# Patient Record
Sex: Female | Born: 1960
Health system: Southern US, Community
[De-identification: ages and names within clinical notes are randomized; demographics above are authoritative.]

## PROBLEM LIST (undated history)

## (undated) DIAGNOSIS — M797 Fibromyalgia: Secondary | ICD-10-CM

## (undated) DIAGNOSIS — D62 Acute posthemorrhagic anemia: Secondary | ICD-10-CM

## (undated) DIAGNOSIS — R112 Nausea with vomiting, unspecified: Secondary | ICD-10-CM

## (undated) DIAGNOSIS — J449 Chronic obstructive pulmonary disease, unspecified: Secondary | ICD-10-CM

## (undated) DIAGNOSIS — Z8619 Personal history of other infectious and parasitic diseases: Secondary | ICD-10-CM

## (undated) DIAGNOSIS — F329 Major depressive disorder, single episode, unspecified: Secondary | ICD-10-CM

## (undated) DIAGNOSIS — Q210 Ventricular septal defect: Secondary | ICD-10-CM

## (undated) DIAGNOSIS — M1712 Unilateral primary osteoarthritis, left knee: Secondary | ICD-10-CM

## (undated) DIAGNOSIS — Q2112 Patent foramen ovale: Secondary | ICD-10-CM

## (undated) DIAGNOSIS — M1711 Unilateral primary osteoarthritis, right knee: Secondary | ICD-10-CM

## (undated) DIAGNOSIS — F419 Anxiety disorder, unspecified: Secondary | ICD-10-CM

## (undated) DIAGNOSIS — R569 Unspecified convulsions: Secondary | ICD-10-CM

## (undated) DIAGNOSIS — G4700 Insomnia, unspecified: Secondary | ICD-10-CM

## (undated) DIAGNOSIS — F32A Depression, unspecified: Secondary | ICD-10-CM

## (undated) DIAGNOSIS — Z9889 Other specified postprocedural states: Secondary | ICD-10-CM

## (undated) DIAGNOSIS — Q283 Other malformations of cerebral vessels: Secondary | ICD-10-CM

## (undated) DIAGNOSIS — K589 Irritable bowel syndrome without diarrhea: Secondary | ICD-10-CM

## (undated) DIAGNOSIS — R0602 Shortness of breath: Secondary | ICD-10-CM

## (undated) DIAGNOSIS — R110 Nausea: Secondary | ICD-10-CM

## (undated) DIAGNOSIS — L4050 Arthropathic psoriasis, unspecified: Secondary | ICD-10-CM

## (undated) DIAGNOSIS — E7431 Sucrase-isomaltase deficiency: Secondary | ICD-10-CM

## (undated) DIAGNOSIS — Q211 Atrial septal defect: Secondary | ICD-10-CM

## (undated) DIAGNOSIS — M069 Rheumatoid arthritis, unspecified: Secondary | ICD-10-CM

## (undated) DIAGNOSIS — Z8709 Personal history of other diseases of the respiratory system: Secondary | ICD-10-CM

## (undated) DIAGNOSIS — L400 Psoriasis vulgaris: Secondary | ICD-10-CM

## (undated) DIAGNOSIS — H9190 Unspecified hearing loss, unspecified ear: Secondary | ICD-10-CM

## (undated) DIAGNOSIS — M2550 Pain in unspecified joint: Secondary | ICD-10-CM

## (undated) DIAGNOSIS — M1990 Unspecified osteoarthritis, unspecified site: Secondary | ICD-10-CM

## (undated) DIAGNOSIS — G4730 Sleep apnea, unspecified: Secondary | ICD-10-CM

## (undated) DIAGNOSIS — K743 Primary biliary cirrhosis: Secondary | ICD-10-CM

## (undated) DIAGNOSIS — Z8669 Personal history of other diseases of the nervous system and sense organs: Secondary | ICD-10-CM

## (undated) DIAGNOSIS — L309 Dermatitis, unspecified: Secondary | ICD-10-CM

## (undated) DIAGNOSIS — I639 Cerebral infarction, unspecified: Secondary | ICD-10-CM

## (undated) DIAGNOSIS — M2540 Effusion, unspecified joint: Secondary | ICD-10-CM

## (undated) DIAGNOSIS — Z87442 Personal history of urinary calculi: Secondary | ICD-10-CM

## (undated) DIAGNOSIS — K219 Gastro-esophageal reflux disease without esophagitis: Secondary | ICD-10-CM

## (undated) DIAGNOSIS — E785 Hyperlipidemia, unspecified: Secondary | ICD-10-CM

## (undated) DIAGNOSIS — N393 Stress incontinence (female) (male): Secondary | ICD-10-CM

## (undated) DIAGNOSIS — E041 Nontoxic single thyroid nodule: Secondary | ICD-10-CM

## (undated) DIAGNOSIS — G971 Other reaction to spinal and lumbar puncture: Secondary | ICD-10-CM

## (undated) DIAGNOSIS — J189 Pneumonia, unspecified organism: Secondary | ICD-10-CM

## (undated) DIAGNOSIS — K5792 Diverticulitis of intestine, part unspecified, without perforation or abscess without bleeding: Secondary | ICD-10-CM

## (undated) DIAGNOSIS — E739 Lactose intolerance, unspecified: Secondary | ICD-10-CM

## (undated) HISTORY — PX: OTHER SURGICAL HISTORY: SHX169

## (undated) HISTORY — DX: Rheumatoid arthritis, unspecified: M06.9

## (undated) HISTORY — DX: Dermatitis, unspecified: L30.9

## (undated) HISTORY — PX: ESOPHAGOGASTRODUODENOSCOPY: SHX1529

## (undated) HISTORY — DX: Gastro-esophageal reflux disease without esophagitis: K21.9

## (undated) HISTORY — PX: COLONOSCOPY: SHX174

## (undated) HISTORY — DX: Irritable bowel syndrome, unspecified: K58.9

---

## 1979-10-17 HISTORY — PX: CHOLECYSTECTOMY: SHX55

## 1979-10-17 HISTORY — PX: APPENDECTOMY: SHX54

## 1988-10-16 HISTORY — PX: TUBAL LIGATION: SHX77

## 1990-10-16 HISTORY — PX: KNEE ARTHROSCOPY: SUR90

## 1997-10-16 HISTORY — PX: FOOT SURGERY: SHX648

## 1998-04-27 ENCOUNTER — Other Ambulatory Visit: Admission: RE | Admit: 1998-04-27 | Discharge: 1998-04-27 | Payer: Self-pay | Admitting: Obstetrics and Gynecology

## 1999-03-22 ENCOUNTER — Ambulatory Visit (HOSPITAL_BASED_OUTPATIENT_CLINIC_OR_DEPARTMENT_OTHER): Admission: RE | Admit: 1999-03-22 | Discharge: 1999-03-22 | Payer: Self-pay | Admitting: Orthopedic Surgery

## 1999-10-14 ENCOUNTER — Other Ambulatory Visit: Admission: RE | Admit: 1999-10-14 | Discharge: 1999-10-14 | Payer: Self-pay | Admitting: *Deleted

## 1999-10-17 HISTORY — PX: KIDNEY STONE SURGERY: SHX686

## 1999-10-17 HISTORY — PX: TOTAL ABDOMINAL HYSTERECTOMY: SHX209

## 1999-12-22 ENCOUNTER — Encounter: Payer: Self-pay | Admitting: Obstetrics and Gynecology

## 1999-12-27 ENCOUNTER — Inpatient Hospital Stay (HOSPITAL_COMMUNITY): Admission: RE | Admit: 1999-12-27 | Discharge: 1999-12-29 | Payer: Self-pay | Admitting: Obstetrics and Gynecology

## 1999-12-27 ENCOUNTER — Encounter (INDEPENDENT_AMBULATORY_CARE_PROVIDER_SITE_OTHER): Payer: Self-pay

## 1999-12-27 ENCOUNTER — Encounter (INDEPENDENT_AMBULATORY_CARE_PROVIDER_SITE_OTHER): Payer: Self-pay | Admitting: Specialist

## 2000-01-11 ENCOUNTER — Encounter: Admission: RE | Admit: 2000-01-11 | Discharge: 2000-01-11 | Payer: Self-pay | Admitting: Obstetrics and Gynecology

## 2000-01-11 ENCOUNTER — Encounter: Payer: Self-pay | Admitting: Obstetrics and Gynecology

## 2002-05-06 ENCOUNTER — Encounter: Payer: Self-pay | Admitting: Emergency Medicine

## 2002-05-06 ENCOUNTER — Inpatient Hospital Stay (HOSPITAL_COMMUNITY): Admission: EM | Admit: 2002-05-06 | Discharge: 2002-05-12 | Payer: Self-pay | Admitting: Emergency Medicine

## 2002-05-12 ENCOUNTER — Encounter: Payer: Self-pay | Admitting: Internal Medicine

## 2002-10-16 HISTORY — PX: CARDIAC CATHETERIZATION: SHX172

## 2002-10-21 ENCOUNTER — Inpatient Hospital Stay (HOSPITAL_COMMUNITY): Admission: EM | Admit: 2002-10-21 | Discharge: 2002-10-22 | Payer: Self-pay | Admitting: Emergency Medicine

## 2004-10-16 HISTORY — PX: HERNIA REPAIR: SHX51

## 2007-03-25 ENCOUNTER — Emergency Department (HOSPITAL_COMMUNITY): Admission: EM | Admit: 2007-03-25 | Discharge: 2007-03-25 | Payer: Self-pay | Admitting: Emergency Medicine

## 2008-10-16 DIAGNOSIS — M069 Rheumatoid arthritis, unspecified: Secondary | ICD-10-CM

## 2008-10-16 HISTORY — PX: INCONTINENCE SURGERY: SHX676

## 2008-10-16 HISTORY — DX: Rheumatoid arthritis, unspecified: M06.9

## 2008-10-29 ENCOUNTER — Ambulatory Visit (HOSPITAL_COMMUNITY): Admission: RE | Admit: 2008-10-29 | Discharge: 2008-10-29 | Payer: Self-pay | Admitting: Family Medicine

## 2008-10-30 ENCOUNTER — Encounter: Payer: Self-pay | Admitting: Orthopedic Surgery

## 2008-10-30 ENCOUNTER — Ambulatory Visit (HOSPITAL_COMMUNITY): Admission: RE | Admit: 2008-10-30 | Discharge: 2008-10-30 | Payer: Self-pay | Admitting: Family Medicine

## 2008-11-02 ENCOUNTER — Ambulatory Visit: Payer: Self-pay | Admitting: Orthopedic Surgery

## 2008-11-02 DIAGNOSIS — M25469 Effusion, unspecified knee: Secondary | ICD-10-CM

## 2008-11-02 DIAGNOSIS — M25569 Pain in unspecified knee: Secondary | ICD-10-CM

## 2008-11-02 DIAGNOSIS — M23302 Other meniscus derangements, unspecified lateral meniscus, unspecified knee: Secondary | ICD-10-CM

## 2008-11-02 LAB — CONVERTED CEMR LAB
Crystals, Fluid: NONE SEEN
Neutrophil, Synovial: 84 % — ABNORMAL HIGH (ref 0–25)

## 2008-11-03 ENCOUNTER — Encounter: Payer: Self-pay | Admitting: Orthopedic Surgery

## 2008-11-05 ENCOUNTER — Ambulatory Visit (HOSPITAL_COMMUNITY): Admission: RE | Admit: 2008-11-05 | Discharge: 2008-11-05 | Payer: Self-pay | Admitting: Family Medicine

## 2008-11-10 ENCOUNTER — Telehealth: Payer: Self-pay | Admitting: Orthopedic Surgery

## 2008-11-12 ENCOUNTER — Ambulatory Visit (HOSPITAL_COMMUNITY): Admission: RE | Admit: 2008-11-12 | Discharge: 2008-11-12 | Payer: Self-pay | Admitting: Orthopedic Surgery

## 2008-11-18 ENCOUNTER — Ambulatory Visit: Payer: Self-pay | Admitting: Orthopedic Surgery

## 2008-11-18 DIAGNOSIS — M1710 Unilateral primary osteoarthritis, unspecified knee: Secondary | ICD-10-CM

## 2008-11-20 ENCOUNTER — Telehealth: Payer: Self-pay | Admitting: Orthopedic Surgery

## 2008-11-30 ENCOUNTER — Encounter: Payer: Self-pay | Admitting: Orthopedic Surgery

## 2008-11-30 ENCOUNTER — Encounter: Admission: RE | Admit: 2008-11-30 | Discharge: 2009-01-26 | Payer: Self-pay | Admitting: Orthopedic Surgery

## 2009-03-14 ENCOUNTER — Emergency Department (HOSPITAL_COMMUNITY): Admission: EM | Admit: 2009-03-14 | Discharge: 2009-03-14 | Payer: Self-pay | Admitting: Emergency Medicine

## 2009-03-23 ENCOUNTER — Inpatient Hospital Stay (HOSPITAL_COMMUNITY): Admission: EM | Admit: 2009-03-23 | Discharge: 2009-03-26 | Payer: Self-pay | Admitting: Emergency Medicine

## 2009-04-07 ENCOUNTER — Ambulatory Visit (HOSPITAL_COMMUNITY): Admission: RE | Admit: 2009-04-07 | Discharge: 2009-04-07 | Payer: Self-pay | Admitting: Obstetrics and Gynecology

## 2009-10-16 HISTORY — PX: OTHER SURGICAL HISTORY: SHX169

## 2009-11-01 ENCOUNTER — Ambulatory Visit: Payer: Self-pay | Admitting: Gastroenterology

## 2009-11-01 DIAGNOSIS — R634 Abnormal weight loss: Secondary | ICD-10-CM

## 2009-11-01 DIAGNOSIS — R112 Nausea with vomiting, unspecified: Secondary | ICD-10-CM

## 2009-11-01 DIAGNOSIS — R109 Unspecified abdominal pain: Secondary | ICD-10-CM

## 2009-11-02 ENCOUNTER — Encounter: Payer: Self-pay | Admitting: Gastroenterology

## 2009-11-05 ENCOUNTER — Ambulatory Visit (HOSPITAL_COMMUNITY): Admission: RE | Admit: 2009-11-05 | Discharge: 2009-11-05 | Payer: Self-pay | Admitting: Gastroenterology

## 2009-11-05 ENCOUNTER — Ambulatory Visit: Payer: Self-pay | Admitting: Gastroenterology

## 2009-11-16 ENCOUNTER — Encounter (HOSPITAL_COMMUNITY): Admission: RE | Admit: 2009-11-16 | Discharge: 2009-12-16 | Payer: Self-pay | Admitting: Gastroenterology

## 2009-11-17 ENCOUNTER — Encounter: Payer: Self-pay | Admitting: Gastroenterology

## 2010-01-04 ENCOUNTER — Ambulatory Visit: Payer: Self-pay | Admitting: Gastroenterology

## 2010-03-31 ENCOUNTER — Ambulatory Visit (HOSPITAL_COMMUNITY): Admission: RE | Admit: 2010-03-31 | Discharge: 2010-03-31 | Payer: Self-pay | Admitting: Family Medicine

## 2010-04-27 ENCOUNTER — Encounter (INDEPENDENT_AMBULATORY_CARE_PROVIDER_SITE_OTHER): Payer: Self-pay

## 2010-04-28 ENCOUNTER — Ambulatory Visit (HOSPITAL_COMMUNITY): Admission: RE | Admit: 2010-04-28 | Discharge: 2010-04-28 | Payer: Self-pay | Admitting: Podiatry

## 2010-05-28 ENCOUNTER — Emergency Department (HOSPITAL_COMMUNITY): Admission: EM | Admit: 2010-05-28 | Discharge: 2010-05-28 | Payer: Self-pay | Admitting: Emergency Medicine

## 2010-06-08 ENCOUNTER — Emergency Department (HOSPITAL_COMMUNITY): Admission: EM | Admit: 2010-06-08 | Discharge: 2010-06-08 | Payer: Self-pay | Admitting: Emergency Medicine

## 2010-06-23 ENCOUNTER — Ambulatory Visit: Payer: Self-pay | Admitting: Gastroenterology

## 2010-06-23 DIAGNOSIS — R197 Diarrhea, unspecified: Secondary | ICD-10-CM

## 2010-06-23 DIAGNOSIS — K589 Irritable bowel syndrome without diarrhea: Secondary | ICD-10-CM | POA: Insufficient documentation

## 2010-06-24 ENCOUNTER — Encounter: Payer: Self-pay | Admitting: Gastroenterology

## 2010-07-10 ENCOUNTER — Ambulatory Visit (HOSPITAL_BASED_OUTPATIENT_CLINIC_OR_DEPARTMENT_OTHER): Admission: RE | Admit: 2010-07-10 | Discharge: 2010-07-10 | Payer: Self-pay | Admitting: Neurology

## 2010-07-10 ENCOUNTER — Encounter: Payer: Self-pay | Admitting: Pulmonary Disease

## 2010-07-12 ENCOUNTER — Ambulatory Visit (HOSPITAL_COMMUNITY): Admission: RE | Admit: 2010-07-12 | Discharge: 2010-07-12 | Payer: Self-pay | Admitting: Family Medicine

## 2010-07-13 ENCOUNTER — Encounter: Admission: RE | Admit: 2010-07-13 | Discharge: 2010-07-13 | Payer: Self-pay | Admitting: Neurology

## 2010-07-16 ENCOUNTER — Ambulatory Visit: Payer: Self-pay | Admitting: Internal Medicine

## 2010-08-22 ENCOUNTER — Ambulatory Visit: Payer: Self-pay | Admitting: Pulmonary Disease

## 2010-08-22 DIAGNOSIS — L259 Unspecified contact dermatitis, unspecified cause: Secondary | ICD-10-CM

## 2010-08-22 DIAGNOSIS — J4520 Mild intermittent asthma, uncomplicated: Secondary | ICD-10-CM

## 2010-08-22 DIAGNOSIS — J4540 Moderate persistent asthma, uncomplicated: Secondary | ICD-10-CM | POA: Insufficient documentation

## 2010-08-22 DIAGNOSIS — G2581 Restless legs syndrome: Secondary | ICD-10-CM | POA: Insufficient documentation

## 2010-08-22 DIAGNOSIS — G4733 Obstructive sleep apnea (adult) (pediatric): Secondary | ICD-10-CM | POA: Insufficient documentation

## 2010-09-29 ENCOUNTER — Ambulatory Visit: Payer: Self-pay | Admitting: Pulmonary Disease

## 2010-10-21 ENCOUNTER — Encounter: Payer: Self-pay | Admitting: Pulmonary Disease

## 2010-10-21 ENCOUNTER — Encounter (INDEPENDENT_AMBULATORY_CARE_PROVIDER_SITE_OTHER): Payer: Self-pay | Admitting: *Deleted

## 2010-11-15 NOTE — Letter (Signed)
Summary: TCS/EGD ORDER  TCS/EGD ORDER   Imported By: Ave Filter 11/02/2009 13:10:39  _____________________________________________________________________  External Attachment:    Type:   Image     Comment:   External Document

## 2010-11-15 NOTE — Assessment & Plan Note (Signed)
Summary: IBS-MIXED, VOMITING   Visit Type:  Follow-up Visit Primary Care Provider:  Lala Lund, M.D.  Chief Complaint:  follow up- still have some problems- still having some pain.  History of Present Illness: Still having 3x/week. It has gotten better. Can't handle dairy: gasy, and vomiting. BMs: 2-3x/week, hard now. Depends on the day and today had a normal one. Trying to get more veggies. Chews a lot more than she used to. Protonix helps with heartburn. Not as nauseous if she takes it at night.  Current Medications (verified): 1)  D-3 5000 Iu .... Take 1 Tablet By Mouth Two Times A Day 2)  Methotrexate Sodium 25 Mg/ml Soln (Methotrexate Sodium) .... 0.8 Ml Subcutaneously Injection  Every Friday Night 3)  Folic Acid 1 Mg Tabs (Folic Acid) .... Take 1 Tablet By Mouth Once A Day 4)  Promethazine Hcl 25 Mg Tabs (Promethazine Hcl) .... As Needed 5)  Zofran 4 Mg Tabs (Ondansetron Hcl) .... As Needed 6)  Protonix 40 Mg Tbec (Pantoprazole Sodium) .... Take 1 Tablet By Mouth Once A Day 7)  Tramadol Hcl 50 Mg Tabs (Tramadol Hcl) .... Three Times A Day 8)  Multivitamin .... Once Daily  Allergies (verified): 1)  ! Sulfa 2)  ! Amoxicillin 3)  ! Avelox (Moxifloxacin Hcl) 4)  ! Guaifenesin (Guaifenesin) 5)  ! Cosyntropin (Cosyntropin) 6)  ! Cipro  Past History:  Past Medical History: Last updated: 11/01/2009 Rheumatoid arthritis, diagnosed 2010 Nephrolithiasis IBS Allergic rhinitis  Review of Systems       Having elevated liver enzymes with MTX. Just got labs drawn.  Vital Signs:  Patient profile:   50 year old female Height:      65 inches Weight:      139 pounds BMI:     23.21 Temp:     98.7 degrees F oral Pulse rate:   60 / minute BP sitting:   108 / 70  (left arm) Cuff size:   regular  Vitals Entered By: Hendricks Limes LPN (January 04, 2010 3:52 PM)  Physical Exam  General:  Well developed, well nourished, no acute distress. Head:  Normocephalic and atraumatic. Lungs:   Clear throughout to auscultation. Heart:  Regular rate and rhythm; no murmurs. Abdomen:  Soft, nontender and nondistended. Normal bowel sounds.  Impression & Recommendations:  Problem # 1:  ABDOMINAL PAIN, LOWER (ICD-789.09) Assessment Improved  2o to IBS-d. Continue current regimen. OPV in 3-4 mos.  Orders: Est. Patient Level II ([address])  Problem # 2:  DIARRHEA, CHRONIC (ICD-787.91) Assessment: Improved Pt now has solid stools.  Problem # 3:  NAUSEA WITH VOMITING (ICD-787.01) 2o to uncontrolled GERD and mildly delayed GE at 1 hour. Continue gastroparesis diet and Zofran as needed.  cc: PCP Prescriptions: ZOFRAN 4 MG TABS (ONDANSETRON HCL) 1po q4-6h as needed nausea or vomiting  #30 x 5   Entered and Authorized by:   West Bali MD   Signed by:   West Bali MD on 01/04/2010   Method used:   Electronically to        [facility]* (retail)       [address]       [place], [place]  [address]       Ph: [phone]       Fax: [phone]   RxID:   [id number]

## 2010-11-15 NOTE — Assessment & Plan Note (Signed)
Summary: NPP/ABD PAIN,CHRONIC DIARRHEA,GU   Visit Type:  Initial Consult Referring Provider:  Lubertha South Primary Care Provider:  Lubertha South  Chief Complaint:  chronic diarrhea.  History of Present Illness: [name] is a pleasant 50 year old lady who presents at the request of Dr. Lubertha South for further evaluation of chronic diarrhea, vomiting, abdominal pain. She complains of chronic symptoms. Her symptoms have been worse since she started methotrexate injections 2 months ago. She complains of a 30 pound weight loss since March of 2010.  She complains of diarrhea 98% of the time. She generally has 5 yellow watery stools daily. Stools are worse postprandially. She usually goes to work without eating and waits until she gets home to eat secondary to the severity. She also complains of daily vomiting which occurs soon as she gets up in the morning. It is also worse with meals. She complains of yellow emesis without blood. Vomiting has been occurring for at least 2 years. She vomits daily. She denies heartburn. Recently she was started on protonix which has not helped with her symptoms. She was started on Bentyl which has caused constipation. She also has lower abdominal pain which is worse with meals and unrelated to bowel movements. Denies dysuria, hematuria. She has constant left flank pain especially since she was hospitalized couple months ago with a kidney stone.  She complains of lactose intolerance especially to milk which causes bloating, gas, diarrhea.  Denies melena, brbpr.  Current Medications (verified): 1)  D-3 5000 Iu .... Take 1 Tablet By Mouth Two Times A Day 2)  Methotrexate Sodium 25 Mg/ml Soln (Methotrexate Sodium) .... 0.8 Ml Subcutaneously Injection  Every Friday Night 3)  Folic Acid 1 Mg Tabs (Folic Acid) .... Take 1 Tablet By Mouth Once A Day 4)  Promethazine Hcl 25 Mg Tabs (Promethazine Hcl) .... As Needed 5)  Zofran 4 Mg Tabs (Ondansetron Hcl) .... As Needed 6)   Dicyclomine Hcl 10 Mg Caps (Dicyclomine Hcl) .... Take 1 Tablet By Mouth Four Times A Day 7)  Protonix 40 Mg Tbec (Pantoprazole Sodium) .... Take 1 Tablet By Mouth Once A Day  Allergies: 1)  ! Sulfa 2)  ! Amoxicillin 3)  ! Avelox (Moxifloxacin Hcl) 4)  ! Guaifenesin (Guaifenesin) 5)  ! Cosyntropin (Cosyntropin) 6)  ! Cipro  Past History:  Past Medical History: Rheumatoid arthritis, diagnosed 2010 Nephrolithiasis IBS Allergic rhinitis  Past Surgical History: Left knee arthroscopy, 1992 Right foot, 1999 (fracture) Total Hysterectomy, 2001, bladder sling, rectal tightening?? Hernia repair, 2003 Kidney stone, stent, 2001 Cholecystectomy, 1981 Tubal Ligation, 1990 Appendectomy, 1981  Family History: Family History of Diabetes Family History Coronary Heart Disease female < 26 Family History of Arthritis No FH of CRC Mat Aunt breast cancer  Social History: Patient is married. Three children. [employer]. Quit tob 8 yrs ago. Rare alcohol. No drug.   Review of Systems General:  Complains of weight loss; denies fever, chills, sweats, anorexia, fatigue, and weakness. Eyes:  Denies vision loss. ENT:  Denies nasal congestion, sore throat, hoarseness, and difficulty swallowing. CV:  Denies chest pains, angina, palpitations, dyspnea on exertion, and peripheral edema. Resp:  Denies dyspnea at rest, dyspnea with exercise, and cough. GI:  See HPI. GU:  Denies urinary burning and blood in urine. MS:  Denies joint pain / LOM. Derm:  Denies rash and itching. Neuro:  Denies weakness, paralysis, abnormal sensation, memory loss, and confusion. Psych:  Denies depression and anxiety. Endo:  Denies unusual weight change. Heme:  Denies bruising and bleeding. Allergy:  Denies hives, rash, and recurrent infections.  Vital Signs:  Patient profile:   50 year old female Height:      65 inches Weight:      147 pounds BMI:     24.55 Temp:     98.1 degrees F oral BP sitting:    100 / 80  (left arm) Cuff size:   regular  Vitals Entered By: Cloria Spring LPN (November 01, 2009 9:53 AM)  Physical Exam  General:  Well developed, well nourished, no acute distress. Head:  Normocephalic and atraumatic. Eyes:  Conjunctivae pink, no scleral icterus.  Mouth:  Oropharyngeal mucosa moist, pink.  No lesions, erythema or exudate.    Neck:  Supple; no masses or thyromegaly. Lungs:  Clear throughout to auscultation. Heart:  Regular rate and rhythm; no murmurs, rubs,  or bruits. Abdomen:  Soft. Normal BS. Protrusion in left lower abd at site of previous hernia repair with some tenderness. Lower abd tenderness noted. No HSM or masses. No abd bruit, rebound, or guarding. Rectal:  deferred until time of colonoscopy.   Extremities:  No clubbing, cyanosis, edema or deformities noted. Neurologic:  Alert and  oriented x4;  grossly normal neurologically. Skin:  Intact without significant lesions or rashes. Cervical Nodes:  No significant cervical adenopathy. Psych:  Alert and cooperative. Normal mood and affect.  Impression & Recommendations:  Problem # 1:  DIARRHEA, CHRONIC (ICD-787.91)  Chronic diarrhea associated with lower abdominal pain and weight loss.  No prior TCS.  DDx includes microscopic/collagenous colitis, celiac disease, IBS, unlikely infectious, malignancy.  Colonoscopy to be performed in near future.  Risks, alternatives, and benefits including but not limited to the risk of reaction to medication, bleeding, infection, and perforation were addressed.  Patient voiced understanding and provided verbal consent. Consider random colonic biopsies +/- SB biopsies based on endoscopic findings.  Orders: Consultation Level IV ([address])  Problem # 2:  NAUSEA WITH VOMITING (ICD-787.01)  Has not responded to PPI.  Symptoms greater than 2 years.  ? etiology. Need to r/u PUD, gastritis.  EGD to be performed in near future.  Risks, alternatives, benefits including but not limited to  risk of reaction to medications, bleeding, infection, and perforation addressed.  Patient voiced understanding and verbal consent obtained.   Orders: Consultation Level IV ([address])    I would like to thank Dr. Lubertha South for allowing [place] to take part in the care of this nice patient.  Appended Document: NPP/ABD PAIN,CHRONIC DIARRHEA,GU Pt likely has GERD, or H. pylori gastritis and IBS-d. Need to know about NSAID use.   Appended Document: NPP/ABD PAIN,CHRONIC DIARRHEA,GU STV LUK  NOV 2010: 148 LBS JAN 2011-145 LBS MAR 2010: WBC 13.3 HB 13.2 PLT 321 ESR 18 CR 0.61 NL HFP TSH 0.576 RF <20 ANA NEG

## 2010-11-15 NOTE — Assessment & Plan Note (Signed)
Summary: IBS-D, GASTRITIS, GERD   Visit Type:  Follow-up Visit Primary Care Provider:  Gerda Diss, M.D.  Chief Complaint:  reflux and diarrhea.  History of Present Illness: Left back bothering her. Comes and goes. Nausea every AM. Vomiting and diarrhea. Nausea: every AM-forever-getting worse, Rx: Phenergan and takes acid pill. Lays in bed in 1 hour and then in 1 hour she gets up. Vomiting: 3-4x/week: no blood, 3-4 times-no dry heaves. Bms: 4-5/d all duirng the dya and start at 530 am. Milk: 2-3 x/week. Ice cream: no Cheese: a little. No GB. No blood in stool. Not takiing anything to slow down diarrhea. Takes CA daily and stopped Bms completely and then when did have a BM had a hard dry miserable BM. Phenergan causes drowsiness and upset stomach. Zofran helps. No travel or Abx. Has well water. Cold all the time. No fever or chiils. No abd pain.  Current Medications (verified): 1)  D-3 5000 Iu .... Take 1 Tablet By Mouth Two Times A Day 2)  Folic Acid 1 Mg Tabs (Folic Acid) .... Take 1 Tablet By Mouth Once A Day 3)  Promethazine Hcl 25 Mg Tabs (Promethazine Hcl) .... As Needed 4)  Protonix 40 Mg Tbec (Pantoprazole Sodium) .... Take 1 Tablet By Mouth Once A Day 5)  Tramadol Hcl 50 Mg Tabs (Tramadol Hcl) .... Three Times A Day 6)  Multivitamin .... Once Daily 7)  Enbrel Sureclick 50 Mg/ml Soln (Etanercept) .... Q Week 8)  Hydrocodone-Acetaminophen 7.5-500 Mg Tabs (Hydrocodone-Acetaminophen) .... Q 4- 6 Hours As Needed  Allergies (verified): 1)  ! Sulfa 2)  ! Amoxicillin 3)  ! Avelox (Moxifloxacin Hcl) 4)  ! Guaifenesin (Guaifenesin) 5)  ! Cosyntropin (Cosyntropin) 6)  ! Cipro  Past History:  Past Medical History: IBS-mixed JAN 2011: IleoTCS/Bx/EGD/Bx-no microscopic colitis or celiac sprue GERD **FEB 2011: GES NL Allergic rhinitis Rheumatoid arthritis, diagnosed 2010 Nephrolithiasis  Past Surgical History: Cholecystectomy, 1981 Appendectomy, 1981 Total Hysterectomy, 2001, bladder  sling, rectal tightening?? Tubal Ligation, 1990  Left knee arthroscopy, 1992 Right foot, 1999 (fracture) Hernia repair, 2003 Kidney stone, stent, 2001  Family History: Reviewed history from 11/01/2009 and no changes required. Family History of Diabetes Family History Coronary Heart Disease female < 66 Family History of Arthritis No FH of CRC Mat Aunt breast cancer  Social History: Reviewed history from 11/01/2009 and no changes required. Patient is married. Three children. [employer]. Quit tob 8 yrs ago. Rare alcohol. No drug.   Vital Signs:  Patient profile:   50 year old female Height:      65 inches Weight:      141 pounds BMI:     23.55 Temp:     98.6 degrees F oral Pulse rate:   68 / minute BP sitting:   120 / 80  (left arm) Cuff size:   regular  Vitals Entered By: Hendricks Limes LPN (June 23, 2010 10:56 AM)  Physical Exam  General:  Well developed, well nourished, no acute distress. Head:  Normocephalic and atraumatic. Eyes:  PERRL, no icterus. Mouth:  No deformity or lesions. Neck:  Supple; no masses. Lungs:  Clear throughout to auscultation. Heart:  Regular rate and rhythm; no murmurs. Abdomen:  Soft, MILD TTP IN BUQ AND EPIGASTRIUM without guarding and without rebound,  nondistended. Normal bowel sounds. Extremities:  DEVICE ON LEFT FOOT. NO EDEMA. Neurologic:  Alert and  oriented x4;  grossly normal neurologically.  Impression & Recommendations:  Problem # 1:  DIARRHEA (ICD-787.91) Assessment Unchanged  MULTIFACTORIAL: IBS-Mixed pattern: nl, hard stools, diarrhea: exacerbated by bile salt and lactose, doubt thyroid disturbance OR GIARDIASIS. Take calcium 500 mg every MWF. Add Benefiber 2 tsp daily. Avoid dairy. Take RESTORA daily. [facility] is an alternative. LOW FAT DIET. See handout. Minimize dairy consumption. FOLLOW UP IN 4 MOS.   CC: PCP  Orders: T-Stool Giardia / Crypto- EIA ([address]) T-TSH ([address])  Problem  # 2:  NAUSEA WITH VOMITING (ICD-787.01) Assessment: Unchanged 2O TO uncontrolled GERD and/or gastritis. Weight stable. Increase Protonix to two times a day. Zofran/Phenergan as needed.  Patient Instructions: 1)  Take calcium 500 mg every MWF. 2)  Add Benefiber 2 tsp daily. 3)  Avoid dairy. 4)  Take RESTORA daily. [facility] is an alternative. 5)  LOW FAT DIET. See handout. 6)  Minimize dairy consumption. 7)  Check thyroid and stool for Giardia. 8)  FOLLOW UP IN 4 MOS. 9)  The medication list was reviewed and reconciled.  All changed / newly prescribed medications were explained.  A complete medication list was provided to the patient / caregiver. Prescriptions: ZOFRAN 4 MG TABS (ONDANSETRON HCL) 1 sl q4-6h as needed nausea or vomiting  #30 x 5   Entered and Authorized by:   West Bali MD   Signed by:   West Bali MD on 06/23/2010   Method used:   Electronically to        [facility]* (retail)       [address]       [place], [place]  [address]       Ph: [phone]       Fax: [phone]   RxID:   [id number] PROTONIX 40 MG TBEC (PANTOPRAZOLE SODIUM) Take 1 tablet by mouth 30 minutes before breakfast and at bedtime  #60 x 5   Entered and Authorized by:   West Bali MD   Signed by:   West Bali MD on 06/23/2010   Method used:   Electronically to        [facility]* (retail)       [address]       [place], [place]  [address]       Ph: [phone]       Fax: [phone]   RxID:   [id number]   Appended Document: IBS-D, GASTRITIS, GERD 4 MONTH F/U OPV IS IN THE COMPUTER  Appended Document: Orders Update    Clinical Lists Changes  Orders: Added new Service order of Est. Patient Level IV ([address]) - Signed

## 2010-11-15 NOTE — Assessment & Plan Note (Signed)
Summary: consult for osa, RLS   Visit Type:  Initial Consult Copy to:  Amelia Jo MD Primary Provider/Referring Provider:  Lubertha South, M.D.  CC:  sleep consult. pt c/o snoring, stop breathing, and chokes at night. pt has had sleep study 06/2010. Marland Kitchen  History of Present Illness: The pt is a 50y/o female who I have been asked to see for management of osa.  The pt has had a sleep study recently, which showed an AHI of 27/hr and desat to 79%.  This is c/w moderate osa.  She was also noted to have large numbers of PLMS (134), with 2/hr resulting in arousal or awakening.  The pt has been noted to have loud snoring during sleep, as well as an abnormal breathing pattern per husband.  She typically goes to bed around 11pm, and arises at 5am to start her day.  She does not feel rested upon arising.  She also notes a very abnormal sensation in her legs in the evening while sitting that she describes as "creepy crawlies", and is made better with movement.  Her husband has told her that she kicks during the night.  She stays very busy during the day with her work, but does note sleep pressure with any period of inactivity.  She will often doze in the evening while watching tv or movies, and can have some sleep pressure with driving home in the afternoons.  Of note, her weight is down 60 pounds over the last 2 years.    Current Medications (verified): 1)  Folic Acid 1 Mg Tabs (Folic Acid) .... Take 1 Tablet By Mouth Once A Day 2)  Promethazine Hcl 25 Mg Tabs (Promethazine Hcl) .... As Needed 3)  Protonix 40 Mg Tbec (Pantoprazole Sodium) .... Take 1 Tablet By Mouth 30 Minutes Before Breakfast and At Bedtime 4)  Tramadol Hcl 50 Mg Tabs (Tramadol Hcl) .... Three Times A Day 5)  Multivitamin .... Once Daily 6)  Enbrel Sureclick 50 Mg/ml Soln (Etanercept) .... Q Week 7)  Zofran 4 Mg Tabs (Ondansetron Hcl) .Marland Kitchen.. 1 Sl Q4-6h As Needed Nausea or Vomiting 8)  Neurontin 300 Mg Caps (Gabapentin) .... 2 Tablets Three Times  A Day 9)  Verapamil Hcl 80 Mg Tabs (Verapamil Hcl) .... 1/2 Tablet At Bedtime 10)  Lidocaine Hcl 4 % Soln (Lidocaine Hcl) .... As Needed 11)  Ventolin Hfa 108 (90 Base) Mcg/act Aers (Albuterol Sulfate) .Marland Kitchen.. 1-2 Puffs Eveyr 4-6 Hrs As Needed  Allergies: 1)  ! Sulfa 2)  ! Amoxicillin 3)  ! Avelox (Moxifloxacin Hcl) 4)  ! Guaifenesin (Guaifenesin) 5)  ! Cosyntropin (Cosyntropin) 6)  ! Cipro 7)  ! Aspirin  Past History:  Past Medical History: IBS-mixed JAN 2011: IleoTCS/Bx/EGD/Bx-no microscopic colitis or celiac sprue GERD **FEB 2011: GES NL Allergic rhinitis Rheumatoid arthritis, diagnosed 2010 Nephrolithiasis Asthma eczema  Past Surgical History: Cholecystectomy, 1981 Appendectomy, 1981 Total Hysterectomy, 2001, bladder sling, rectal tightening?? Tubal Ligation, 1990  Left knee arthroscopy, 1992 Right foot, 1999 (fracture) Hernia repair, 2003 Kidney stone, 2001  Family History: Reviewed history from 11/01/2009 and no changes required. Family History of Diabetes--brother.mgm,pgm,mgf,pgf Family History Coronary Heart Disease- 2 brothers, uncle Family History of Arthritis--mgm Mat Aunt breast cancer ovarian cancer--mother  Social History: Reviewed history from 11/01/2009 and no changes required. Patient is married.  Three children.  [employer].  Quit smoking 2002. 2 ppd. started age 69 Rare alcohol. No drug.   Review of Systems       The patient  complains of acid heartburn, indigestion, weight change, tooth/dental problems, headaches, nasal congestion/difficulty breathing through nose, and joint stiffness or pain.  The patient denies shortness of breath with activity, shortness of breath at rest, productive cough, non-productive cough, coughing up blood, chest pain, abdominal pain, difficulty swallowing, sore throat, sneezing, itching, ear ache, anxiety, depression, hand/feet swelling, rash, change in color of mucus, and fever.    Vital  Signs:  Patient profile:   50 year old female Height:      65 inches Weight:      141 pounds O2 Sat:      100 % on Room air Temp:     98.5 degrees F oral Pulse rate:   63 / minute BP sitting:   110 / 58  (left arm) Cuff size:   regular  Vitals Entered By: Carver Fila (August 22, 2010 3:32 PM)  O2 Flow:  Room air CC: sleep consult. pt c/o snoring, stop breathing, chokes at night. pt has had sleep study 06/2010.  Comments meds and allergies updated Phone number updated Carver Fila  August 22, 2010 3:32 PM    Physical Exam  General:  wd female in nad Eyes:  PERRLA and EOMI.   Nose:  mildly narrowed with some turbinate hypertrophy, but patent bilat. Mouth:  mild tonsillar hypertrophy, mild elongation of uvula, normal palate. Neck:  no jvd , tmg, LN Lungs:  clear to auscultation Heart:  rrr,no mrg  Abdomen:  soft and nontender, bs+ Extremities:  no edema or cyanosis, pulses intact distally Neurologic:  alert and oriented, moves all 4.   Impression & Recommendations:  Problem # 1:  OBSTRUCTIVE SLEEP APNEA (ICD-327.23) the pt has moderate osa by her recent sleep study, and is clearly symptomatic with nonrestorative sleep and inappropriate daytime sleepiness.  I have had a long discussion with the pt about sleep apnea, including its impact on CV health as well.  I have reviewed the various treatment options, including modest weight loss, upper airway surgery, dental appliance, and also cpap.  After a long discussion, she would like to start with cpap and see how she responds.  I will set the patient up on cpap at a moderate pressure level to allow for desensitization, and will troubleshoot the device over the next 4-6weeks if needed.  The pt is to call me if having issues with tolerance.  Will then optimize the pressure once patient is able to wear cpap on a consistent basis.  Problem # 2:   RESTLESS LEGS SYNDROME (ICD-333.94) the pt has a classic history in the early evening for  RLS, and has kicking during sleep as well.  I would consider treatment with dopamine agonist in order to improve these symptoms, and the pt is agreeable.  If she continues to have issues with this, would consider checking a serum ferritin to r/o iron deficiency.    Medications Added to Medication List This Visit: 1)  Neurontin 300 Mg Caps (Gabapentin) .... 2 tablets three times a day 2)  Verapamil Hcl 80 Mg Tabs (Verapamil hcl) .... 1/2 tablet at bedtime 3)  Lidocaine Hcl 4 % Soln (Lidocaine hcl) .... As needed 4)  Ventolin Hfa 108 (90 Base) Mcg/act Aers (Albuterol sulfate) .Marland Kitchen.. 1-2 puffs eveyr 4-6 hrs as needed 5)  Requip 0.5 Mg Tabs (Ropinirole hcl) .... After dinner each night  Other Orders: Consultation Level IV ([address]) DME Referral (DME)  Patient Instructions: 1)  will start on cpap at moderate level.  Please call if  having issues with tolerance.  Will discuss optimizing pressure next visit 2)  will start on requip 0.5mg  after dinner for your abnormal leg sensations and kicking. 3)  work on modest weight loss 4)  followup with me in 5 weeks   Prescriptions: REQUIP 0.5 MG  TABS (ROPINIROLE HCL) after dinner each night  #30 x 6   Entered and Authorized by:   Barbaraann Share MD   Signed by:   Barbaraann Share MD on 08/22/2010   Method used:   Print then Give to Patient   RxID:   [id number]

## 2010-11-15 NOTE — Letter (Signed)
Summary: GES ORDER  GES ORDER   Imported By: Ricard Dillon 11/17/2009 12:06:59  _____________________________________________________________________  External Attachment:    Type:   Image     Comment:   External Document

## 2010-11-15 NOTE — Letter (Signed)
Summary: Recall Office Visit  [facility]  [address]   [place], [place] [address]   Phone: [phone]  Fax: [phone]      April 27, 2010   [facility] [address] [place], [place]  [address] 03/06/1961   Dear Ms. Hafner,   According to our records, it is time for you to schedule a follow-up office visit with [place].   At your convenience, please call [phone] to schedule an office visit. If you have any questions, concerns, or feel that this letter is in error, we would appreciate your call.   Sincerely,    Hendricks Limes LPN  [facility] Ph: [phone]   Fax: [phone]

## 2010-11-17 NOTE — Letter (Signed)
Summary: Recall Office Visit  [facility]  [address]   [place], [place] [address]   Phone: [phone]  Fax: [phone]      October 21, 2010   [facility] [address] [place], [place]  [address] 04-12-1961   Dear Ms. Culliton,   According to our records, it is time for you to schedule a follow-up office visit with [place].   At your convenience, please call [phone] to schedule an office visit. If you have any questions, concerns, or feel that this letter is in error, we would appreciate your call.   Sincerely,    Diana Eves  [facility] Ph: [phone]   Fax: [phone]

## 2010-11-17 NOTE — Letter (Signed)
Summary: CMN for Oxygen / [facility]  CMN for Oxygen / [facility]   Imported By: Lennie Odor 11/01/2010 11:00:20  _____________________________________________________________________  External Attachment:    Type:   Image     Comment:   External Document

## 2010-12-30 LAB — CBC
HCT: 39.9 % (ref 36.0–46.0)
Hemoglobin: 13.8 g/dL (ref 12.0–15.0)
Platelets: 204 10*3/uL (ref 150–400)
RDW: 13 % (ref 11.5–15.5)
WBC: 9.6 10*3/uL (ref 4.0–10.5)

## 2010-12-30 LAB — DIFFERENTIAL
Eosinophils Relative: 0 % (ref 0–5)
Lymphocytes Relative: 9 % — ABNORMAL LOW (ref 12–46)
Lymphs Abs: 0.8 10*3/uL (ref 0.7–4.0)
Monocytes Relative: 3 % (ref 3–12)

## 2010-12-30 LAB — URINALYSIS, ROUTINE W REFLEX MICROSCOPIC
Bilirubin Urine: NEGATIVE
Nitrite: NEGATIVE
Specific Gravity, Urine: 1.02 (ref 1.005–1.030)
pH: 8.5 — ABNORMAL HIGH (ref 5.0–8.0)

## 2010-12-30 LAB — COMPREHENSIVE METABOLIC PANEL
AST: 21 U/L (ref 0–37)
Albumin: 4.3 g/dL (ref 3.5–5.2)
CO2: 21 mEq/L (ref 19–32)
Calcium: 9.3 mg/dL (ref 8.4–10.5)
Creatinine, Ser: 0.56 mg/dL (ref 0.4–1.2)
GFR calc Af Amer: >60 mL/min (ref >60)
GFR calc non Af Amer: >60 mL/min (ref >60)
Total Protein: 7.4 g/dL (ref 6.0–8.3)

## 2010-12-30 LAB — LIPASE, BLOOD: Lipase: 28 U/L (ref 11–59)

## 2011-01-01 LAB — SURGICAL PCR SCREEN
MRSA, PCR: NEGATIVE
Staphylococcus aureus: NEGATIVE

## 2011-01-01 LAB — BASIC METABOLIC PANEL
GFR calc non Af Amer: >60 mL/min (ref >60)
Potassium: 3.9 mEq/L (ref 3.5–5.1)
Sodium: 138 mEq/L (ref 135–145)

## 2011-01-01 LAB — HEMOGLOBIN AND HEMATOCRIT, BLOOD: Hemoglobin: 13.7 g/dL (ref 12.0–15.0)

## 2011-01-02 ENCOUNTER — Encounter: Payer: Self-pay | Admitting: Gastroenterology

## 2011-01-12 NOTE — Medication Information (Signed)
Summary: PA for pantoprazole  PA for pantoprazole   Imported By: Hendricks Limes LPN 04/54/0981 19:14:78  _____________________________________________________________________  External Attachment:    Type:   Image     Comment:   External Document

## 2011-01-23 LAB — URINALYSIS, ROUTINE W REFLEX MICROSCOPIC
Ketones, ur: >80 mg/dL — AB
Nitrite: POSITIVE — AB
Urobilinogen, UA: 0.2 mg/dL (ref 0.0–1.0)

## 2011-01-23 LAB — URINE CULTURE
Colony Count: NO GROWTH
Special Requests: POSITIVE

## 2011-01-23 LAB — BASIC METABOLIC PANEL
BUN: 2 mg/dL — ABNORMAL LOW (ref 6–23)
CO2: 23 mEq/L (ref 19–32)
CO2: 25 mEq/L (ref 19–32)
CO2: 28 mEq/L (ref 19–32)
Calcium: 8.9 mg/dL (ref 8.4–10.5)
Calcium: 9.4 mg/dL (ref 8.4–10.5)
Chloride: 107 mEq/L (ref 96–112)
Chloride: 110 mEq/L (ref 96–112)
Creatinine, Ser: 0.6 mg/dL (ref 0.4–1.2)
Creatinine, Ser: 0.69 mg/dL (ref 0.4–1.2)
Creatinine, Ser: 0.91 mg/dL (ref 0.4–1.2)
GFR calc Af Amer: >60 mL/min (ref >60)
GFR calc Af Amer: >60 mL/min (ref >60)
GFR calc Af Amer: >60 mL/min (ref >60)
GFR calc Af Amer: >60 mL/min (ref >60)
GFR calc non Af Amer: >60 mL/min (ref >60)
Glucose, Bld: 120 mg/dL — ABNORMAL HIGH (ref 70–99)
Potassium: 2.9 mEq/L — ABNORMAL LOW (ref 3.5–5.1)
Potassium: 3.7 mEq/L (ref 3.5–5.1)
Sodium: 139 mEq/L (ref 135–145)

## 2011-01-23 LAB — CBC
HCT: 32.8 % — ABNORMAL LOW (ref 36.0–46.0)
HCT: 34.3 % — ABNORMAL LOW (ref 36.0–46.0)
HCT: 36.1 % (ref 36.0–46.0)
Hemoglobin: 12.4 g/dL (ref 12.0–15.0)
Hemoglobin: 13.1 g/dL (ref 12.0–15.0)
MCHC: 35.5 g/dL (ref 30.0–36.0)
MCHC: 36 g/dL (ref 30.0–36.0)
MCV: 92.3 fL (ref 78.0–100.0)
MCV: 92.4 fL (ref 78.0–100.0)
Platelets: 191 10*3/uL (ref 150–400)
Platelets: 270 10*3/uL (ref 150–400)
RBC: 3.55 MIL/uL — ABNORMAL LOW (ref 3.87–5.11)
RBC: 3.98 MIL/uL (ref 3.87–5.11)
RBC: 4.54 MIL/uL (ref 3.87–5.11)
RDW: 13.4 % (ref 11.5–15.5)
WBC: 11.1 10*3/uL — ABNORMAL HIGH (ref 4.0–10.5)
WBC: 15.3 10*3/uL — ABNORMAL HIGH (ref 4.0–10.5)
WBC: 5.3 10*3/uL (ref 4.0–10.5)

## 2011-01-23 LAB — DIFFERENTIAL
Basophils Absolute: 0 10*3/uL (ref 0.0–0.1)
Basophils Relative: 0 % (ref 0–1)
Basophils Relative: 0 % (ref 0–1)
Eosinophils Absolute: 0 10*3/uL (ref 0.0–0.7)
Eosinophils Absolute: 0.1 10*3/uL (ref 0.0–0.7)
Eosinophils Relative: 0 % (ref 0–5)
Eosinophils Relative: 0 % (ref 0–5)
Eosinophils Relative: 1 % (ref 0–5)
Lymphocytes Relative: 11 % — ABNORMAL LOW (ref 12–46)
Lymphs Abs: 1.2 10*3/uL (ref 0.7–4.0)
Lymphs Abs: 1.3 10*3/uL (ref 0.7–4.0)
Monocytes Absolute: 0.7 10*3/uL (ref 0.1–1.0)
Monocytes Absolute: 0.7 10*3/uL (ref 0.1–1.0)
Monocytes Absolute: 0.9 10*3/uL (ref 0.1–1.0)
Monocytes Relative: 12 % (ref 3–12)
Monocytes Relative: 7 % (ref 3–12)
Monocytes Relative: 8 % (ref 3–12)
Neutro Abs: 13.3 10*3/uL — ABNORMAL HIGH (ref 1.7–7.7)
Neutro Abs: 9 10*3/uL — ABNORMAL HIGH (ref 1.7–7.7)
Neutrophils Relative %: 62 % (ref 43–77)
Neutrophils Relative %: 87 % — ABNORMAL HIGH (ref 43–77)

## 2011-01-24 LAB — CBC
MCHC: 35.8 g/dL (ref 30.0–36.0)
MCV: 91.1 fL (ref 78.0–100.0)
Platelets: 280 10*3/uL (ref 150–400)
RBC: 4.49 MIL/uL (ref 3.87–5.11)
WBC: 14.2 10*3/uL — ABNORMAL HIGH (ref 4.0–10.5)

## 2011-01-24 LAB — BASIC METABOLIC PANEL
BUN: 14 mg/dL (ref 6–23)
CO2: 22 mEq/L (ref 19–32)
Calcium: 9.9 mg/dL (ref 8.4–10.5)
Chloride: 106 mEq/L (ref 96–112)
Creatinine, Ser: 0.8 mg/dL (ref 0.4–1.2)
GFR calc Af Amer: >60 mL/min (ref >60)

## 2011-01-24 LAB — URINALYSIS, ROUTINE W REFLEX MICROSCOPIC
Glucose, UA: NEGATIVE mg/dL
Protein, ur: 100 mg/dL — AB
Urobilinogen, UA: 0.2 mg/dL (ref 0.0–1.0)

## 2011-01-24 LAB — DIFFERENTIAL
Basophils Relative: 1 % (ref 0–1)
Eosinophils Absolute: 0.1 10*3/uL (ref 0.0–0.7)
Monocytes Relative: 8 % (ref 3–12)
Neutro Abs: 10.4 10*3/uL — ABNORMAL HIGH (ref 1.7–7.7)
Neutrophils Relative %: 73 % (ref 43–77)

## 2011-01-24 LAB — URINE CULTURE: Colony Count: NO GROWTH

## 2011-01-24 LAB — URINE MICROSCOPIC-ADD ON

## 2011-02-23 ENCOUNTER — Other Ambulatory Visit: Payer: Self-pay

## 2011-02-23 MED ORDER — PANTOPRAZOLE SODIUM 40 MG PO TBEC: 40.0000 mg | DELAYED_RELEASE_TABLET | Freq: Every day | ORAL | Status: DC

## 2011-02-28 NOTE — H&P (Signed)
NAME:  [name], [name]               ACCOUNT NO.:  [id number]   MEDICAL RECORD NO.:  [id number]          PATIENT TYPE:  INP   LOCATION:  A213                          FACILITY:  [facility]   PHYSICIAN:  Ky Barban, M.D.DATE OF BIRTH:  April 21, 1961   DATE OF ADMISSION:  03/23/2009  DATE OF DISCHARGE:  LH                              HISTORY & PHYSICAL   CHIEF COMPLAINT:  Left renal colic.   HISTORY:  A 50 year old female who came to see me because she was having  severe pain in her left flank associated with nausea, vomiting, fever,  and chills.  No voiding difficulty.  About a week ago, went to the  emergency room in [facility].  A CT scan showed that she has a 2-mm  stone in the left upper ureter with minimum obstruction.  I examined  this patient, suggested her to go to the emergency room for possible  admission.   PAST HISTORY:  Denies any history of kidney stone.   PHYSICAL EXAMINATION:  GENERAL:  Moderately built, is in extreme  distress, fully conscious, alert, oriented.  VITAL SIGNS:  Blood pressure is 120/80, temperature is 101.  ABDOMEN:  Soft, flat.  Liver, spleen, and kidneys are not palpable.  1+  left CVA tenderness.  CENTRAL NERVOUS SYSTEM:  Negative.  HEAD, NECK, EYES, AND ENT:  Negative.  CHEST:  Symmetrical.  HEART:  Regular sinus rhythm.  PELVIC:  Deferred.   IMPRESSION:  Left ureteral calculus, possible left pyelonephritis.  I  recommend IV fluids, parenteral analgesia, and urine culture.      Ky Barban, M.D.  Electronically Signed     MIJ/MEDQ  D:  03/23/2009  T:  03/24/2009  Job:  [id number]

## 2011-02-28 NOTE — Op Note (Signed)
NAME:  [name], [name]               ACCOUNT NO.:  [id number]   MEDICAL RECORD NO.:  [id number]          PATIENT TYPE:  INP   LOCATION:  A332                          FACILITY:  [facility]   PHYSICIAN:  Ky Barban, M.D.DATE OF BIRTH:  June 29, 1961   DATE OF PROCEDURE:  03/25/2009  DATE OF DISCHARGE:                               OPERATIVE REPORT   PREOPERATIVE DIAGNOSIS:  Left ureteral calculus with left  pyelonephritis.   POSTOPERATIVE DIAGNOSIS:  Left ureteral calculus with left  pyelonephritis.   PROCEDURE:  Cystoscopy, insertion of left ureteral stent, collection of  urine, left renal pelvis for culture and sensitivities.  Dilation of the  urethra.   ANESTHESIA:  General.   DESCRIPTION OF PROCEDURE:  The patient under general anesthesia in  lithotomy position, usual prep and drape.  I tried to insert #25  cystoscope, but the urethra was tied, it was dilated with Sissy Hoff  sound to 32-French.  Then, I introduced the cystoscope, bladder was  inspected, looks normal.  Left ureteral orifice was inspected and I  inserted a guidewire which went up into the renal pelvis without any  problem over the guidewire.  Open-end catheter was introduced into the  renal pelvis.  The guidewire was removed, hydronephrotic drip was  obtained.  There was a cloudy urine, it was collected for culture and  sensitivities.  The guidewire was reintroduced, the open-end catheter  was removed, and over the guide wire 5-French 24-cm double-J stent was  positioned under fluoroscopic control within the renal pelvis and the  bladder guidewire was removed.  Nice loop on the bladder and the kidney  pelvis was obtained.  All the instruments were removed after doing a  bimanual pelvic exam which was unremarkable.  The patient left the  operating room in satisfactory condition.      Ky Barban, M.D.  Electronically Signed     MIJ/MEDQ  D:  03/25/2009  T:  03/25/2009  Job:  [id number]

## 2011-03-03 NOTE — Discharge Summary (Signed)
NAME:  [name], [name]                       ACCOUNT NO.:  [id number]   MEDICAL RECORD NO.:  [id number]                   [name] TYPE:  INP   LOCATION:  5530                                 FACILITY:  [facility]   PHYSICIAN:  Lonia Blood, M.D.            DATE OF BIRTH:  28-Aug-1961   DATE OF ADMISSION:  05/06/2002  DATE OF DISCHARGE:  05/12/2002                                 DISCHARGE SUMMARY   DISCHARGE DIAGNOSES:  1. Allergic response to unknown stimulus, resolved.  2. Bronchospasm secondary to #1 - resolved.  3. Newly appreciated allergic response to ciprofloxacin.  4. Chronic abdominal pain with nausea and vomiting - etiology unknown.  5. Urinary tract infection - treated with Keflex.  6. History of diverticulitis approximately 15 years prior to admission.  7. Recent reaction to MSG requiring emergency room treatment.  8. Hysterectomy.  9. Status post cholecystectomy and appendectomy.  10.      Left knee surgery in 1990.  11.      Right foot reconstructive surgery in 1991.  12.      Bladder sling surgery three years prior to admission.  13.      Reported allergy to sulfa and MSG both causing hives and swelling.   DISCHARGE MEDICATIONS:  1. Protonix 40 mg q.d.  2. Zyrtec 10 mg q.d.  3. Albuterol inhaler two puffs every three hours as needed for shortness of     breath.  4. EpiPen - use as directed should you develop severe difficulty breathing.  5. Xanax 0.5 mg t.i.d. as needed.  6. Benadryl p.r.n.   FOLLOW UP:  [name] will follow up with an allergist, Dr. Zonia Kief,  Monday, August 4, at 9:10 a.m. for formal allergy testing.   PROCEDURE:  CT scan of Gwendolyn abdomen and pelvis - no focal masses or  adenopathy.  Nonspecific dilatation of Gwendolyn bowel with short segment  air/fluid levels most consistent with ileus.  Diagnostic upper GI with high density KUB on May 12, 2002 - unremarkable  bowel gas pattern.  Esophageal peristalsis normal.  No esophageal narrowing  or  ulceration.  Stomach normal.  Duodenal bulb and second portion of  duodenum unremarkable.   HISTORY OF PRESENT ILLNESS:  [name] is a 50 year old female who came to  [facility] emergency room with complaints of nausea and vomiting 15 times  associated with generalized cramping abdominal pain.  She had no fevers,  chills, no diarrhea, and no dysuria.  She had been seen in [facility] emergency  room at [facility] a couple of days prior to her admission and  treated for MSG reaction including hives and swelling of Gwendolyn throat.  This  had occurred after eating chinese food.  [name] was discharged home,  but had recurrent hives and associated abdominal pain on Gwendolyn date of  admission.  [name] was admitted because of possible ongoing allergic  reaction as well as intractable  nausea and vomiting and inability to  tolerate p.o.    HOSPITAL COURSE:  1. Abdominal pain with nausea and vomiting.  Pancreatitis was ruled out.     C.difficile toxin titer was negative.  Ileus was noted on evaluation with     radiographic techniques.  [name] was treated for such and this did     resolve prior to her discharge.  At Gwendolyn time of discharge, [name]     was tolerating full diet with no difficulty whatsoever.  2. Questionable MSG allergic response - allergic response to multiple     agents.  At Gwendolyn time of admission, [name] was placed on steroid     therapy because of evident hives and angioedema which was believed to be     left over from MSG [name] had recently consumed.  [name] did     report multiple episodes of reactions just like this which she treats at     home with Benadryl.  During Gwendolyn hospitalization, [name] required     Cipro therapy for UTI and did have a similar recurrence of symptoms with     Cipro.  She is deemed to be allergic to Cipro.  Her allergic response was     marked by significant Urticaria as well as angioedema and audible     expiratory wheezing.   Wheezing was treated with albuterol nebulizers.     [name] required very slow steroid taper during hospitalization and     multiple doses of Benadryl.  [name] was able to tolerate Keflex     well.  On a combination of steroids, Xanax for anxiety for control,     albuterol for bronchospasm, and Benadryl, [name]'s symptoms were     able to be controlled.  Zyrtec was also added prior to her discharge.  At     Gwendolyn time of discharge, [name]'s steroid taper had been discontinued     and [name] was without any symptoms of systemic allergic reaction.     [name] was advised that she likely was allergic to multiple agents.     [name] was advised as to Gwendolyn appropriate use of an EpiPen and Gwendolyn     indications for its use and given a prescription for this device.  Gwendolyn     [name] was set up to see an allergist for formal testing on August 4, at     9:10 a.m.  3. Urinary tract infection.  During hospitalization urinalysis revealed     trace leukocyte esterase and nitrate positive with many bacteria.  Gwendolyn     [name] was treated with Cipro initially, but did have an allergic     response, and therefore was changed to Keflex.  [name] was     tolerating Keflex at Gwendolyn time of discharge with no apparent allergic     response.  She is to complete a full course.                                               Lonia Blood, M.D.    JTM/MEDQ  D:  06/25/2002  T:  06/27/2002  Job:  [id number]

## 2011-03-03 NOTE — Discharge Summary (Signed)
NAME:  [name], [name]               ACCOUNT NO.:  [id number]   MEDICAL RECORD NO.:  [id number]          PATIENT TYPE:  INP   LOCATION:  A332                          FACILITY:  [facility]   PHYSICIAN:  Ky Barban, M.D.DATE OF BIRTH:  03-04-1961   DATE OF ADMISSION:  03/23/2009  DATE OF DISCHARGE:  06/11/2010LH                               DISCHARGE SUMMARY   She was admitted with a left renal colic.  CT scan showed 2-mm stone in  the left upper ureter with minimum obstruction.  She has been to the  emergency room and was being followed up in the office and the pain has  become severe.  My clinical impression is that she has left  pyelonephritis and we are admitting her to control her pain and probably  insert a double-J stent.  She was taken to the operating room on June  10.  Insertion of double-J stent was done without strength.  Urine was  collected from the left renal pelvis also.  She was admitted for IV  antibiotics.  She was started on IV Cipro.  Subsequently, urine culture  showed no growth.  Her pain subsided and at this point, she is being  discharged home with double-J stent, which will be removed later on in  the office.   DISCHARGE MEDICATIONS:  Cipro 250 mg 1 p.o. b.i.d. for 7 days, Percocet  1 q.6 h. p.r.n. #20 and report to the office in 2 week.      Ky Barban, M.D.  Electronically Signed     MIJ/MEDQ  D:  04/21/2009  T:  04/22/2009  Job:  [id number]

## 2011-03-03 NOTE — H&P (Signed)
NAME:  [name], [name]                       ACCOUNT NO.:  [id number]   MEDICAL RECORD NO.:  [id number]                   PATIENT TYPE:  EMS   LOCATION:  MAJO                                 FACILITY:  [facility]   PHYSICIAN:  Rollene Rotunda, M.D. [facility]            DATE OF BIRTH:  [birth year]   DATE OF ADMISSION:  10/21/2002  DATE OF DISCHARGE:                                HISTORY & PHYSICAL   REASON FOR PRESENTATION:  Evaluate patient with chest pain.   HISTORY OF PRESENT ILLNESS:  The patient is a 50 year old white female with  no prior cardiac history.  She had an episode of chest discomfort at 8 p.m.  last night while at rest.  This was substernal and somewhat sharp.  It  lasted for about 30 minutes before easing spontaneously.  At 4 a.m., she  awoke with more intense discomfort described as 5-6 out of 10 in intensity.  It was again substernal and on the left side.  It was sharp.  It did radiate  to her left shoulder and left arm.  She says she was clammy, diaphoretic,  and nauseated.  She says she vomiting.  This was not like pain she had had  before.  In particular, it was not like reflux or gallbladder pain that she  had had in the past.  The pain did ease to 2-3 out of 10 in intensity.  She  presented to [facility].  She was treated with aspirin and  nitroglycerin with further improvement of her discomfort.  She was noted to  be bradycardic with sinus rhythm in the 30s and was treated with atropine as  well.  She did not have presyncope or syncope apparently.  An initial EKG  was without acute changes.  She was brought to the emergency room via EMS.  En route, she had another sublingual nitroglycerin with further improvement  in her chest pain.  She is still experiencing some 1 out of 10 discomfort.  She appears quite anxious.  The patient is active, working every day.  She  does exercise on some aerobic machines occasionally.  With this, she has not  been able  to bring on any cardiovascular symptoms.  She has had no  palpitations, PND, or orthopnea.  She denies any shortness of breath.   PAST MEDICAL HISTORY:  The patient was hospitalized at [facility] in July  for an allergic reaction with urticaria and angioedema.  The culprit was not  entirely clear.  It was felt that it could be monosodium glutamate (MSG).  She was also noted to be allergic to Cipro at that time, dyslipidemia (low  HDL 32, total cholesterol 409, triglycerides 76, LDL 95), diverticulitis,  urinary tract infection.   PAST SURGICAL HISTORY:  Total hysterectomy, bilateral tubal ligation, right  foot surgery, left knee surgery, appendectomy/cholecystectomy.   ALLERGIES:  SULFA, MONOSODIUM GLUTAMATE (MSG), CIPROFLOXACIN.   MEDICATIONS:  Climara  patch 1 time per week x4 years.   SOCIAL HISTORY:  The patient has been married for 18 years.  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  She was a cigarette smoker, one  pack per day for greater than 25 years, but quit in July 2003.   FAMILY HISTORY:  Noncontributory for early coronary artery disease in first  degree relatives, although she does have more distant relatives with early  onset disease.   REVIEW OF SYSTEMS:  As stated in the HPI, positive for previous migraines,  20% hearing loss in the right ear, eczema, menopause.  Otherwise, as stated  in the HPI.  Negative for all other systems.   PHYSICAL EXAMINATION:  GENERAL:  The patient appears anxious, but in no  acute distress.  VITAL SIGNS:  Blood pressure 148/85 (similar in both arms), respiratory rate  18, pulse 88 and regular.  HEENT:  Eyes unremarkable, pupils equal, round, and reactive to light, fundi  not visualized.  Oral mucosa unremarkable.  NECK:  No jugular venous distention, waveform within normal  limits, carotid  upstroke brisk and symmetrical.  No bruits.  No thyromegaly.  LYMPHATICS:  No cervical, axillary, or inguinal adenopathy.  LUNGS:  Clear to auscultation bilaterally.  BACK:  No costovertebral angle tenderness.  CHEST:  Unremarkable.  PMI not displaced or sustained.  S1 and S2 within  normal limits, no S2, no S4, no murmurs.  ABDOMEN:  Flat, positive bowel sounds, normal in frequency and pitch.  No  bruits, rebound, guarding, midline pulse, no masses or hepatomegaly, or  splenomegaly.  SKIN:  No rash.  No nodules.  EXTREMITIES:  2+ pulses throughout, no edema, no cyanosis, no clubbing.  NEURO:  Oriented to person, place, and time.  Cranial nerves II-XII grossly  intact, motor grossly intact.   LABORATORY DATA:  WBC 6.4, hemoglobin 12.7.  Sodium 140, potassium 3.7, BUN  6, creatinine 0.2.  Chest x-ray portable technique, no acute disease.   An EKG [facility]), sinus bradycardia with sinus  arrhythmia, low voltage in the limb leads and precordial leads, poor  anterior R-wave progression, no acute ST-T wave changes.   ASSESSMENT/PLAN:  1. Chest discomfort.  The patient's chest discomfort does have some typical     features in that it was substernal, radiated to the arm, and was improved     with nitrates.  There were associated symptoms.  Given this, we need to     assume that this is unstable angina.  The next test should be cardiac     catheterization.  The risks and benefits of this have been extensively     described to the patient and her husband.  They understand and agree to     proceed.  She will be treated with aspirin and heparin.  We will avoid     beta blockers secondary to the bradyarrhythmia.  2. Risk reduction.  The patient will be started on a statin as she is found     to have coronary disease.  We will discuss diet and exercise going     forward. 3. Bradycardia.  It is unclear whether this might be related to ischemia     versus a  vagal reaction.  We will avoid beta blockers for the time being.  4. Allergic reaction.  The  patient did have any UNCLEAR ALLERGIC REACTION     felt to be related possibly to MONOSODIUM GLUTAMATE (MSG).  There is no     evidence of allergy to contrast or iodine.  We will avoid any known     allergens.  5. Questionable central nervous system arteriovenous malformation.  The     patient describes having an angiogram 10 years ago for an incidental     finding on her brain.  She describes what might be an arteriovenous     malformation.  I have no records of this.  She was told that it was an     incidental finding and would require no further evaluation.  She has had     no symptoms (i.e., no headache or other neurologic symptoms).                                               Rollene Rotunda, M.D. [facility]     JH/MEDQ  D:  10/21/2002  T:  10/21/2002  Job:  [id number]   cc:   Gloriajean Dell. Andrey Campanile, M.D.  [address]  [place]  [place] [address]  Fax: [phone]

## 2011-03-03 NOTE — Discharge Summary (Signed)
NAME:  [name], [name]                       ACCOUNT NO.:  [id number]   MEDICAL RECORD NO.:  [id number]                   PATIENT TYPE:  INP   LOCATION:  4734                                 FACILITY:  [facility]   PHYSICIAN:  Rollene Rotunda, M.D. [facility]            DATE OF BIRTH:  18-Jun-1961   DATE OF ADMISSION:  10/21/2002  DATE OF DISCHARGE:  10/22/2002                           DISCHARGE SUMMARY - REFERRING   PROCEDURES:  1. Cardiac catheterization.  2. Coronary arteriogram.  3. Left ventriculography.   HOSPITAL COURSE:  The patient is a 50 year old female with no known history  of coronary artery disease who had anterior chest tightness on the day  before admission and the day of admission without any exertion.  On the day  of admission, the tightness radiated into her left shoulder and left arm and  was associated with shortness of breath, nausea, vomiting, and diaphoresis.  She was admitted to the hospital for cardiac catheterization and further  evaluation.   The patient had a cardiac catheterization on 10/21/2002 which showed normal  coronary arteries with a dominant RCA and EF of 65%.  It was felt that her  symptoms were not secondary to coronary artery disease, and a D-dimer was  ordered.   The D-dimer was negative, and the next day she was evaluated and had some  improvement in her symptoms, although she did complain of a headache which  was probably secondary to lack of caffeine.  She also has a history of  migraines.  She was evaluated by Dr. Antoine Poche who felt that if her D-dimer  was negative, she could be discharged with outpatient followup.  Her D-dimer  was negative, and she was considered stable for discharge on 10/22/2002.   LABORATORY DATA:  D-dimer less than 0.22.  Hemoglobin 12.7, hematocrit 37.4,  WBC 6.4, platelets 204. Sodium 140, potassium 3.7, chloride 110, CO2 24, BUN  6, creatinine 0.5.  TSH 1.180.  Lipid profile pending at time of dictation.   Chest  x-ray: No active disease.   CONDITION ON DISCHARGE:  Stable.   DISCHARGE DIAGNOSES:  1. Chest pain.  No cardiac cause of chest pain by catheterization and D-     dimer negative for pulmonary embolus this admission.  2. Dyslipidemia.  3. History of diverticulitis.  4. Status post total hysterectomy, bilateral tubal ligation, and right foot     and knee surgery.  5. Status post appendectomy and cholecystectomy.  6. History of hospitalization in July 2003 for allergic reaction thought to     be secondary to Cipro or monosodium gluconate.  7. History of allergies to sulfa, monosodium gluconate, and Cipro.   DISCHARGE INSTRUCTIONS:  1. Her activity level is to include no driving or sexual or strenuous     activity for two days.  2. She is to stick to a low-fat diet.  3. She is to call the office with problems with catheterization site.  4. She is to follow up with Dr. Antoine Poche on a p.r.n. basis.  5. She is to call [facility] for an appointment.   DISCHARGE MEDICATIONS:  Climara patch weekly.       Lavella Hammock, P.A. [facility]                  Rollene Rotunda, M.D. [facility]    RG/MEDQ  D:  10/22/2002  T:  10/22/2002  Job:  [id number]   cc:   Dr. Valentino Hue [employer]

## 2011-03-03 NOTE — Cardiovascular Report (Signed)
   NAME:  [name], [name]                       ACCOUNT NO.:  [id number]   MEDICAL RECORD NO.:  [id number]                   PATIENT TYPE:  INP   LOCATION:  4734                                 FACILITY:  [facility]   PHYSICIAN:  Rollene Rotunda, M.D. [facility]            DATE OF BIRTH:  07-Jun-1961   DATE OF PROCEDURE:  DATE OF DISCHARGE:                              CARDIAC CATHETERIZATION   PRIMARY CARE PHYSICIAN:  Gloriajean Dell. Andrey Campanile, M.D.   PROCEDURE:  Left heart catheterization/coronary angiography.   INDICATIONS:  Evaluate patient with chest pain suggestive of unstable  angina.   PROCEDURE NOTE:  Left heart catheterization performed via the right femoral  artery. The artery was cannulated using anterior wall puncture. A #6-French  arterial sheath was inserted via the modified Seldinger technique. Preformed  Judkins and pigtail catheter were utilized. The patient tolerated the  procedure well and left the lab in stable condition.   RESULTS:  HEMODYNAMICS:  LV 119/15, AO 123/64.  CORONARIES:  Left main was normal. The LAD was normal. The circumflex was  normal. The right coronary artery was dominant and normal.  LEFT VENTRICULOGRAM:  Left ventriculogram was obtained in the RAO  projection. EF was 65% with normal wall motion.   CONCLUSION:  Normal coronary arteries. Normal left ventricular function.   PLAN:  The patient will have a D-dimer and will continue on heparin until we  have excluded the possibility of pulmonary embolism. She will be monitored  on telemetry overnight, given her bradycardia. Further evaluation will be  based on the results of this observation and the laboratories.                                                Rollene Rotunda, M.D. [facility]    JH/MEDQ  D:  10/21/2002  T:  10/22/2002  Job:  [id number]   cc:   Gloriajean Dell. Andrey Campanile, M.D.  [address]  [place]  [place] [address]  Fax: [phone]

## 2011-04-12 ENCOUNTER — Other Ambulatory Visit: Payer: Self-pay | Admitting: *Deleted

## 2011-04-12 MED ORDER — ROPINIROLE HCL 0.5 MG PO TABS: ORAL_TABLET | ORAL | Status: DC

## 2011-04-20 ENCOUNTER — Telehealth: Payer: Self-pay | Admitting: Pulmonary Disease

## 2011-04-20 ENCOUNTER — Other Ambulatory Visit: Payer: Self-pay

## 2011-04-20 MED ORDER — PANTOPRAZOLE SODIUM 40 MG PO TBEC: 40.0000 mg | DELAYED_RELEASE_TABLET | Freq: Every day | ORAL | Status: DC

## 2011-04-20 NOTE — Telephone Encounter (Signed)
Please schedule ov.  

## 2011-04-20 NOTE — Telephone Encounter (Signed)
[email or web address]  

## 2011-04-20 NOTE — Telephone Encounter (Signed)
Needs OV.  

## 2011-04-21 MED ORDER — ROPINIROLE HCL 0.5 MG PO TABS: ORAL_TABLET | ORAL | Status: DC

## 2011-04-21 NOTE — Telephone Encounter (Signed)
[id number]

## 2011-04-21 NOTE — Telephone Encounter (Signed)
Spoke with the pt and she states she was calling about a refill on her requip. She has an appt on 05-18-11. I advised I can give refills to last until appt. Pt states understanding. Carron Curie, CMA

## 2011-05-08 ENCOUNTER — Encounter: Payer: Self-pay | Admitting: Gastroenterology

## 2011-05-08 NOTE — Telephone Encounter (Signed)
Pt is aware of OV for 8/13 @ 9 with LSL

## 2011-05-12 ENCOUNTER — Encounter: Payer: Self-pay | Admitting: Pulmonary Disease

## 2011-05-18 ENCOUNTER — Ambulatory Visit (INDEPENDENT_AMBULATORY_CARE_PROVIDER_SITE_OTHER): Payer: BC Managed Care – PPO | Admitting: Pulmonary Disease

## 2011-05-18 ENCOUNTER — Encounter: Payer: Self-pay | Admitting: Pulmonary Disease

## 2011-05-18 VITALS — BP 116/76 | HR 59 | Temp 98.1°F | Ht 65.0 in | Wt 158.2 lb

## 2011-05-18 DIAGNOSIS — G4733 Obstructive sleep apnea (adult) (pediatric): Secondary | ICD-10-CM

## 2011-05-18 DIAGNOSIS — G2581 Restless legs syndrome: Secondary | ICD-10-CM

## 2011-05-18 MED ORDER — ROPINIROLE HCL 0.5 MG PO TABS: ORAL_TABLET | ORAL | Status: DC

## 2011-05-18 NOTE — Assessment & Plan Note (Signed)
Doing well on dopamine agonist.

## 2011-05-18 NOTE — Assessment & Plan Note (Addendum)
The pt is doing well with cpap, but has not had her pressure optimized.  Will put on auto for 2 weeks, and let her know optimal pressure.  She is very happy with her response to therapy, and is going to work harder on weight loss.  She does have some pressure on bridge of nose from current mask, and I have sent an order to DME to show her different options.  Care Plan:  At this point, will arrange for the patient's machine to be changed over to auto mode for 2 weeks to optimize their pressure.  I will review the downloaded data once sent by dme, and also evaluate for compliance, leaks, and residual osa.  I will call the patient and dme to discuss the results, and have the patient's machine set appropriately.  This will serve as the pt's cpap pressure titration.

## 2011-05-18 NOTE — Patient Instructions (Signed)
Stay on cpap, and work on weight loss Try different masks to see if fits better. followup with me in one year.

## 2011-05-18 NOTE — Progress Notes (Signed)
  Subjective:    Patient ID: [name], female    DOB: 06/04/1961, 50 y.o.   MRN: [id number]  HPI The pt comes in today for f/u of her osa.  She was last seen 08/2010 where she was started on cpap, but also started on dopamine agonist for RLS.  She was supposed to f/u with me in 4 weeks, but never did.  She has not had her pressure optimized since starting cpap.  She is wearing cpap compliantly, and has seen a difference in how she sleeps and feels the next day.  She is having some issues with mask leak, but has no issue with pressure.  The requip has really helped her leg jerks during the night.    Review of Systems  Constitutional: Negative for fever and unexpected weight change.  HENT: Positive for ear pain, congestion, rhinorrhea, sneezing, dental problem, postnasal drip and sinus pressure. Negative for nosebleeds, sore throat and trouble swallowing.   Eyes: Positive for redness and itching.  Respiratory: Positive for cough, chest tightness, shortness of breath and wheezing.   Cardiovascular: Positive for leg swelling. Negative for palpitations.  Gastrointestinal: Positive for nausea and vomiting.  Genitourinary: Negative for dysuria.  Musculoskeletal: Positive for joint swelling.  Skin: Negative for rash.  Neurological: Positive for headaches.  Hematological: Does not bruise/bleed easily.  Psychiatric/Behavioral: Negative for dysphoric mood. The patient is not nervous/anxious.        Objective:   Physical Exam Ow female in nad Nares without discharge or purulence, mild erythema on bridge of nose OP with exudate or abnl. Cor with rrr LE without edema, no cyanosis noted. Alert, does not appear sleepy, moves all 4        Assessment & Plan:

## 2011-05-29 ENCOUNTER — Ambulatory Visit: Payer: Self-pay | Admitting: Gastroenterology

## 2011-07-21 ENCOUNTER — Emergency Department (HOSPITAL_COMMUNITY)
Admission: EM | Admit: 2011-07-21 | Discharge: 2011-07-21 | Disposition: A | Discharge status: Home / Self Care | Payer: BC Managed Care – PPO | Attending: Emergency Medicine | Admitting: Emergency Medicine

## 2011-07-21 ENCOUNTER — Emergency Department (HOSPITAL_COMMUNITY): Payer: BC Managed Care – PPO

## 2011-07-21 DIAGNOSIS — M069 Rheumatoid arthritis, unspecified: Secondary | ICD-10-CM | POA: Insufficient documentation

## 2011-07-21 DIAGNOSIS — R109 Unspecified abdominal pain: Secondary | ICD-10-CM | POA: Insufficient documentation

## 2011-07-21 DIAGNOSIS — Z79899 Other long term (current) drug therapy: Secondary | ICD-10-CM | POA: Insufficient documentation

## 2011-07-21 DIAGNOSIS — R339 Retention of urine, unspecified: Secondary | ICD-10-CM | POA: Insufficient documentation

## 2011-07-21 DIAGNOSIS — R1110 Vomiting, unspecified: Secondary | ICD-10-CM | POA: Insufficient documentation

## 2011-07-21 DIAGNOSIS — N201 Calculus of ureter: Secondary | ICD-10-CM | POA: Insufficient documentation

## 2011-07-21 DIAGNOSIS — N390 Urinary tract infection, site not specified: Secondary | ICD-10-CM | POA: Insufficient documentation

## 2011-07-21 DIAGNOSIS — J45909 Unspecified asthma, uncomplicated: Secondary | ICD-10-CM | POA: Insufficient documentation

## 2011-07-21 LAB — POCT I-STAT, CHEM 8
Chloride: 110 mEq/L (ref 96–112)
HCT: 38 % (ref 36.0–46.0)
Hemoglobin: 12.9 g/dL (ref 12.0–15.0)
Potassium: 4 mEq/L (ref 3.5–5.1)
Sodium: 144 mEq/L (ref 135–145)

## 2011-07-21 LAB — URINE MICROSCOPIC-ADD ON

## 2011-07-21 LAB — URINALYSIS, ROUTINE W REFLEX MICROSCOPIC
Nitrite: POSITIVE — AB
Specific Gravity, Urine: 1.02 (ref 1.005–1.030)
Urobilinogen, UA: 1 mg/dL (ref 0.0–1.0)
pH: 6.5 (ref 5.0–8.0)

## 2011-07-22 LAB — URINE CULTURE
Colony Count: NO GROWTH
Culture: NO GROWTH

## 2011-08-03 LAB — D-DIMER, QUANTITATIVE: D-Dimer, Quant: <0.22

## 2011-08-03 LAB — BASIC METABOLIC PANEL
Calcium: 9.4
GFR calc Af Amer: >60
GFR calc non Af Amer: >60
Glucose, Bld: 114 — ABNORMAL HIGH
Potassium: 3.5
Sodium: 140

## 2011-08-03 LAB — CBC
Hemoglobin: 13.4
Platelets: 277
RDW: 12.5
WBC: 5.2

## 2011-08-03 LAB — POCT CARDIAC MARKERS
CKMB, poc: <1 — ABNORMAL LOW
Myoglobin, poc: 31.5
Operator id: 4001
Troponin i, poc: <0.05

## 2011-08-03 LAB — DIFFERENTIAL
Basophils Absolute: 0
Lymphocytes Relative: 35
Lymphs Abs: 1.8
Monocytes Absolute: 0.4
Neutro Abs: 2.9

## 2011-09-24 ENCOUNTER — Other Ambulatory Visit: Payer: Self-pay | Admitting: Pulmonary Disease

## 2011-09-24 DIAGNOSIS — G4733 Obstructive sleep apnea (adult) (pediatric): Secondary | ICD-10-CM

## 2011-12-06 ENCOUNTER — Other Ambulatory Visit: Payer: Self-pay | Admitting: Neurology

## 2011-12-08 ENCOUNTER — Other Ambulatory Visit: Payer: BC Managed Care – PPO

## 2011-12-11 ENCOUNTER — Other Ambulatory Visit: Payer: BC Managed Care – PPO

## 2011-12-12 ENCOUNTER — Ambulatory Visit
Admission: RE | Admit: 2011-12-12 | Discharge: 2011-12-12 | Disposition: A | Discharge status: Home / Self Care | Payer: BC Managed Care – PPO | Source: Ambulatory Visit | Attending: Neurology | Admitting: Neurology

## 2011-12-21 ENCOUNTER — Other Ambulatory Visit: Payer: Self-pay | Admitting: Family Medicine

## 2011-12-21 DIAGNOSIS — E041 Nontoxic single thyroid nodule: Secondary | ICD-10-CM

## 2011-12-25 ENCOUNTER — Other Ambulatory Visit: Payer: Self-pay

## 2011-12-25 ENCOUNTER — Ambulatory Visit (HOSPITAL_COMMUNITY)
Admission: RE | Admit: 2011-12-25 | Discharge: 2011-12-25 | Disposition: A | Discharge status: Home / Self Care | Payer: BC Managed Care – PPO | Source: Ambulatory Visit | Attending: Family Medicine | Admitting: Family Medicine

## 2011-12-25 DIAGNOSIS — E042 Nontoxic multinodular goiter: Secondary | ICD-10-CM | POA: Insufficient documentation

## 2011-12-25 DIAGNOSIS — E041 Nontoxic single thyroid nodule: Secondary | ICD-10-CM

## 2011-12-25 NOTE — Telephone Encounter (Signed)
Needs OV.  

## 2011-12-25 NOTE — Telephone Encounter (Signed)
Leave message with husband to let her know she will need to have an office visit before we can give her refills.

## 2011-12-27 NOTE — Telephone Encounter (Signed)
She will have to wait because of her work schedule and she will call us back.

## 2012-01-03 ENCOUNTER — Other Ambulatory Visit: Payer: Self-pay

## 2012-01-04 MED ORDER — PANTOPRAZOLE SODIUM 40 MG PO TBEC: 40.0000 mg | DELAYED_RELEASE_TABLET | Freq: Every day | ORAL | Status: DC

## 2012-01-04 NOTE — Telephone Encounter (Signed)
Needs OV for f/u.

## 2012-01-15 ENCOUNTER — Other Ambulatory Visit: Payer: Self-pay | Admitting: Otolaryngology

## 2012-01-15 DIAGNOSIS — E041 Nontoxic single thyroid nodule: Secondary | ICD-10-CM

## 2012-01-17 ENCOUNTER — Other Ambulatory Visit: Payer: Self-pay | Admitting: Family Medicine

## 2012-01-17 DIAGNOSIS — E041 Nontoxic single thyroid nodule: Secondary | ICD-10-CM

## 2012-01-23 ENCOUNTER — Ambulatory Visit
Admission: RE | Admit: 2012-01-23 | Discharge: 2012-01-23 | Disposition: A | Discharge status: Home / Self Care | Payer: BC Managed Care – PPO | Source: Ambulatory Visit | Attending: Otolaryngology | Admitting: Otolaryngology

## 2012-01-23 ENCOUNTER — Other Ambulatory Visit (HOSPITAL_COMMUNITY)
Admission: RE | Admit: 2012-01-23 | Discharge: 2012-01-23 | Disposition: A | Discharge status: Home / Self Care | Payer: BC Managed Care – PPO | Source: Ambulatory Visit | Attending: Interventional Radiology | Admitting: Interventional Radiology

## 2012-01-23 DIAGNOSIS — E049 Nontoxic goiter, unspecified: Secondary | ICD-10-CM | POA: Insufficient documentation

## 2012-01-23 DIAGNOSIS — E041 Nontoxic single thyroid nodule: Secondary | ICD-10-CM

## 2012-04-19 ENCOUNTER — Ambulatory Visit (INDEPENDENT_AMBULATORY_CARE_PROVIDER_SITE_OTHER): Payer: BC Managed Care – PPO | Admitting: Surgery

## 2012-04-19 ENCOUNTER — Encounter (INDEPENDENT_AMBULATORY_CARE_PROVIDER_SITE_OTHER): Payer: Self-pay | Admitting: Surgery

## 2012-04-19 VITALS — BP 110/64 | HR 56 | Temp 97.4°F | Resp 14 | Ht 65.0 in | Wt 160.4 lb

## 2012-04-19 DIAGNOSIS — K432 Incisional hernia without obstruction or gangrene: Secondary | ICD-10-CM

## 2012-04-19 NOTE — Progress Notes (Signed)
Patient ID: [name], female   DOB: Oct 27, 1960, 51 y.o.   MRN: [id number]  No chief complaint on file.   HPI [name] is a 51 y.o. female.   HPIPatient sent at the request of Dr.Luking  due to lower abdominal pain and a bulge that has been present for 6 months. She had a previous open ventral hernia repair with mesh in 2003 at [facility]. Over the last 6 months, she developed increasing abdominal pain at the operative site with a bulge. The pain is moderate in intensity becoming more common and frequent especially with exertion. A CT was done back in May for kidney stones which I have reviewed. There appears to be mesh and tacks extruded in the hernia sac. The radiologist does not  comment about this in his report.  Past Medical History  Diagnosis Date  . IBS (irritable bowel syndrome)     mixed  . GERD (gastroesophageal reflux disease)   . Allergic rhinitis   . Rheumatoid arthritis 2010  . Nephrolithiasis   . Asthma   . Eczema     Past Surgical History  Procedure Date  . Cholecystectomy 1981  . Appendectomy 1981  . Total abdominal hysterectomy   . Incontinence surgery   . Knee arthroscopy 1992    left  . Foot surgery 1999    right  . Hernia repair 2003  . Kidney stone surgery 2001    Family History  Problem Relation Age of Onset  . Diabetes Maternal Grandmother   . Diabetes Brother   . Diabetes Paternal Grandmother   . Diabetes Maternal Grandfather   . Diabetes Paternal Grandfather   . Heart disease Brother   . Heart disease Other     Uncle  . Arthritis Maternal Grandmother   . Breast cancer Maternal Aunt   . Ovarian cancer Mother     Social History History  Substance Use Topics  . Smoking status: Former Smoker -- 2.0 packs/day for 30 years    Types: Cigarettes    Quit date: 10/16/2000  . Smokeless tobacco: Not on file  . Alcohol Use: Yes     rare    Allergies  Allergen Reactions  . Amoxicillin     REACTION: GI upset  . Aspirin     REACTION: severe gi upset  . Ciprofloxacin     REACTION: angioedema, urticaria  . Cosyntropin     REACTION: swelling, hives  . Guaifenesin     REACTION: head rash  . Moxifloxacin     REACTION: ???  . Nsaids   . Sulfonamide Derivatives     REACTION: swelling, tongue and hives    Current Outpatient Prescriptions  Medication Sig Dispense Refill  . conjugated estrogens (PREMARIN) vaginal cream Place vaginally daily.       Marland Kitchen etanercept (ENBREL SURECLICK) 50 MG/ML injection Inject 50 mg into the skin once a week.        . gabapentin (NEURONTIN) 300 MG capsule Take 600 mg by mouth 2 (two) times daily.       . Lidocaine 0.5 % AERO Apply topically.      . Misc Natural Products (FIBER 7) POWD Take by mouth daily.        . Multiple Vitamin (MULTIVITAMIN) capsule Take 1 capsule by mouth daily.        . NON FORMULARY Lidocain Spray.  Use as directed for headaches.       . ondansetron (ZOFRAN) 4 MG tablet Take 4 mg by  mouth every 6 (six) hours as needed.        . pantoprazole (PROTONIX) 40 MG tablet Take 1 tablet (40 mg total) by mouth daily.  30 tablet  3  . Probiotic Product (PROBIOTIC FORMULA PO) Take by mouth daily.        . promethazine (PHENERGAN) 25 MG tablet Take 25 mg by mouth every 6 (six) hours as needed.        Marland Kitchen rOPINIRole (REQUIP) 0.5 MG tablet Take one tablet after dinner each night  30 tablet  11  . traMADol (ULTRAM) 50 MG tablet Take 1 to 2 tabs twice a day as needed.      . verapamil (CALAN) 80 MG tablet 1/2  To 1 tablet at bedtime       . rOPINIRole (REQUIP) 0.5 MG tablet 1 tablet after dinner each night  30 tablet  0    Review of Systems Review of Systems  Constitutional: Negative for fever, chills and unexpected weight change.  HENT: Negative for hearing loss, congestion, sore throat, trouble swallowing and voice change.   Eyes: Negative for visual disturbance.  Respiratory: Negative for cough and wheezing.   Cardiovascular: Negative for chest pain, palpitations and  leg swelling.  Gastrointestinal: Negative for nausea, vomiting, abdominal pain, diarrhea, constipation, blood in stool, abdominal distention and anal bleeding.  Genitourinary: Negative for hematuria, vaginal bleeding and difficulty urinating.  Musculoskeletal: Negative for arthralgias.  Skin: Negative for rash and wound.  Neurological: Negative for seizures, syncope and headaches.  Hematological: Negative for adenopathy. Does not bruise/bleed easily.  Psychiatric/Behavioral: Negative for confusion.    Blood pressure 110/64, pulse 56, temperature 97.4 F (36.3 C), resp. rate 14, height 5\' 5"  (1.651 m), weight 160 lb 6.4 oz (72.757 kg).  Physical Exam Physical Exam  Constitutional: She is oriented to person, place, and time. She appears well-developed and well-nourished.  HENT:  Head: Normocephalic and atraumatic.  Eyes: EOM are normal. Pupils are equal, round, and reactive to light.  Neck: Normal range of motion. Neck supple.  Cardiovascular: Normal rate and regular rhythm.   Pulmonary/Chest: Effort normal and breath sounds normal.  Abdominal: Soft. She exhibits no distension.    Musculoskeletal: Normal range of motion.  Neurological: She is alert and oriented to person, place, and time.  Skin: Skin is warm and dry.  Psychiatric: She has a normal mood and affect. Her behavior is normal. Judgment and thought content normal.    Data Reviewed  CT Abdomen Pelvis Wo Contrast   Status: Final result       PACS Images     Show images for CT Abdomen Pelvis Wo Contrast      Study Result     *RADIOLOGY REPORT*   Clinical Data: Right-sided flank pain.  History of kidney stones and rheumatoid arthritis.  Post hysterectomy.   CT ABDOMEN AND PELVIS WITHOUT CONTRAST   Technique:  Multidetector CT imaging of the abdomen and pelvis was performed following the standard protocol without intravenous contrast.   Comparison: 03/23/2009 [facility] CT.   Findings: 1.5 mm right  ureteral vesicle junction obstructing calculus.  Mild fullness of the right renal collecting system with pararenal fluid consistent with obstruction.   Previously noted left hydronephrosis has cleared.  Tiny nonobstructing left lower pole renal calculi.   Mild rotation of the right kidney.  Unenhanced imaging without focal renal lesion.   Combination of scarring and atelectasis lung bases more notable on the right.   Unenhanced imaging without focal liver,  splenic, pancreatic or adrenal lesion.   Prior anterior abdominal/pelvic hernia repair with fatty containing hernia adjacent to the mesh.   No bowel inflammatory process noted.  Appendix not visualized.   No bony destructive lesion.  Degenerative changes lower lumbar spine.   Mild atherosclerotic type changes abdominal aorta without aneurysmal dilation.  Mild atherosclerotic type changes right femoral artery.  No pelvic or abdominal adenopathy.  Gallbladder not visualized.   IMPRESSION: 1.5 mm right ureteral vesicle junction obstructing calculus.  Mild fullness of the right renal collecting system with pararenal fluid consistent with obstruction.   Please see above.   Original Report Authenticated By: Fuller Canada, M.D.       External Result Report     External Result Report       Imaging     Imaging Information       Signed by       Signed Date/Time   Phone Pager    Fuller Canada 07/21/2011  9:21 AM EDT [phone]          Assessment    Recurrent lower abdominal ventral hernia  With extruded mesh in new hernia sac    Plan    Open repair of recurrent incisional hernia.The risk of hernia repair include bleeding,  Infection,   Recurrence of the hernia,  Mesh use, chronic pain,  Organ injury,  Bowel injury,  Bladder injury,   nerve injury with numbness around the incision,  Death,  and worsening of preexisting  medical problems.  The alternatives to surgery have been discussed as well..  Long  term expectations of both operative and non operative treatments have been discussed. Laparoscopic and open repair discussed. The patient agrees to proceed.Will obtain records from previous surgery from [facility]  Recurrence rate of 50 % for  Recurrent repair.  She will hold Embrel a week before surgery.       Chatara Lucente A. 04/19/2012, 11:04 AM

## 2012-04-19 NOTE — Patient Instructions (Signed)
Hernia Repair Care After These instructions give you information on caring for yourself after your procedure. Your doctor may also give you more specific instructions. Call your doctor if you have any problems or questions after your procedure. HOME CARE   You may have changes in your poops (bowel movements).   You may have loose or watery poop (diarrhea).   You may be not able to poop.   Your bowels will slowly get back to normal.   Do not eat any food that makes you sick to your stomach (nauseous). Eat small meals 4 to 6 times a day instead of 3 large ones.   Do not drink pop. It will give you gas.   Do not drink alcohol.   Do not lift anything heavier than 10 pounds. This is about the weight of a gallon of milk.   Do not do anything that makes you very tired for at least 6 weeks.   Do not get your wound wet for 2 days.   You may take a sponge bath during this time.   After 2 days you may take a shower. Gently pat your surgical cut (incision) dry with a towel. Do not rub it.   For men: You may have been given an athletic supporter (scrotal support) before you left the hospital. It holds your scrotum and testicles closer to your body so there is no strain on your wound. Wear the supporter until your doctor tells you that you do not need it anymore.  GET HELP RIGHT AWAY IF:  You have watery poop, or cannot poop for more than 3 days.   You feel sick to your stomach or throw up (vomit) more than 2 or 3 times.   You have temperature by mouth above 102 F (38.9 C).   You see redness or puffiness (swelling) around your wound.   You see yellowish white fluid (pus) coming from your wound.   You see a bulge or bump in your lower belly (abdomen) or near your groin.   You develop a rash, trouble breathing, or any other symptoms from medicines taken.  MAKE SURE YOU:  Understand these instructions.   Will watch your condition.   Will get help right away if your are not doing  well or get worse.  Document Released: 09/14/2008 Document Revised: 09/21/2011 Document Reviewed: 09/14/2008 [facility] Patient Information 2012 [place], [place].Hernia, Surgical Repair A hernia occurs when an internal organ pushes out through a weak spot in the belly (abdominal) wall muscles. Hernias commonly occur in the groin and around the navel. Hernias often can be pushed back into place (reduced). Most hernias tend to get worse over time. Problems occur when abdominal contents get stuck in the opening (incarcerated hernia). The blood supply gets cut off (strangulated hernia). This is an emergency and needs surgery. Otherwise, hernia repair can be an elective procedure. This means you can schedule this at your convenience when an emergency is not present. Because complications can occur, if you decide to repair the hernia, it is best to do it soon. When it becomes an emergency procedure, there is increased risk of complications after surgery. CAUSES   Heavy lifting.   Obesity.   Prolonged coughing.   Straining to move your bowels.   Hernias can also occur through a cut (incision) by a surgeonafter an abdominal operation.  HOME CARE INSTRUCTIONS Before the repair:  Bed rest is not required. You may continue your normal activities, but avoid heavy lifting (more  than 10 pounds) or straining. Cough gently. If you are a smoker, it is best to stop. Even the best hernia repair can break down with the continual strain of coughing.   Do not wear anything tight over your hernia. Do not try to keep it in with an outside bandage or truss. These can damage abdominal contents if they are trapped in the hernia sac.   Eat a normal diet. Avoid constipation. Straining over long periods of time to have a bowel movement will increase hernia size. It also can breakdown repairs. If you cannot do this with diet alone, laxatives or stool softeners may be used.  PRIOR TO SURGERY, SEEK IMMEDIATE MEDICAL CARE  IF: You have problems (symptoms) of a trapped (incarcerated) hernia. Symptoms include:  An oral temperature above 102 F (38.9 C) develops, or as your caregiver suggests.   Increasing abdominal pain.   Feeling sick to your stomach(nausea) and vomiting.   You stop passing gas or stool.   The hernia is stuck outside the abdomen, looks discolored, feels hard, or is tender.   You have any changes in your bowel habits or in the hernia that is unusual for you.  LET YOUR CAREGIVERS KNOW ABOUT THE FOLLOWING:  Allergies.   Medications taken including herbs, eye drops, over the counter medications, and creams.   Use of steroids (by mouth or creams).   Family or personal history of problems with anesthetics or Novocaine.   Possibility of pregnancy, if this applies.   Personal history of blood clots (thrombophlebitis).   Family or personal history of bleeding or blood problems.   Previous surgery.   Other health problems.  BEFORE THE PROCEDURE You should be present 1 hour prior to your procedure, or as directed by your caregiver.  AFTER THE PROCEDURE After surgery, you will be taken to the recovery area. A nurse will watch and check your progress there. Once you are awake, stable, and taking fluids well, you will be allowed to go home as long as there are no problems. Once home, an ice pack (wrapped in a light towel) applied to your operative site may help with discomfort. It may also keep the swelling down. Do not lift anything heavier than 10 pounds (4.55 kilograms). Take showers not baths. Do not drive while taking narcotics. Follow instructions as suggested by your caregiver.  SEEK IMMEDIATE MEDICAL CARE IF: After surgery:  There is redness, swelling, or increasing pain in the wound.   There is pus coming from the wound.   There is drainage from a wound lasting longer than 1 day.   An unexplained oral temperature above 102 F (38.9 C) develops.   You notice a foul smell  coming from the wound or dressing.   There is a breaking open of a wound (edged not staying together) after the sutures have been removed.   You notice increasing pain in the shoulders (shoulder strap areas).   You develop dizzy episodes or fainting while standing.   You develop persistent nausea or vomiting.   You develop a rash.   You have difficulty breathing.   You develop any reaction or side effects to medications given.  MAKE SURE YOU:   Understand these instructions.   Will watch your condition.   Will get help right away if you are not doing well or get worse.  Document Released: 03/28/2001 Document Revised: 09/21/2011 Document Reviewed: 02/18/2008 [facility] Patient Information 2012 [place], [place].

## 2012-05-01 ENCOUNTER — Other Ambulatory Visit: Payer: Self-pay | Admitting: Pulmonary Disease

## 2012-05-01 MED ORDER — ROPINIROLE HCL 0.5 MG PO TABS: ORAL_TABLET | ORAL | Status: DC

## 2012-05-01 NOTE — Telephone Encounter (Signed)
Faxed refill request received from [facility] for Ropinirole HCL 0.5 mg Take 1 tablet daily after supper Refill sent in to pharmacy

## 2012-05-07 ENCOUNTER — Encounter (HOSPITAL_COMMUNITY): Payer: Self-pay | Admitting: Pharmacy Technician

## 2012-05-10 ENCOUNTER — Encounter (HOSPITAL_COMMUNITY)
Admission: RE | Admit: 2012-05-10 | Discharge: 2012-05-10 | Disposition: A | Discharge status: Home / Self Care | Payer: BC Managed Care – PPO | Source: Ambulatory Visit | Attending: Surgery | Admitting: Surgery

## 2012-05-10 ENCOUNTER — Encounter (HOSPITAL_COMMUNITY): Payer: Self-pay

## 2012-05-10 ENCOUNTER — Ambulatory Visit (HOSPITAL_COMMUNITY)
Admission: RE | Admit: 2012-05-10 | Discharge: 2012-05-10 | Disposition: A | Discharge status: Home / Self Care | Payer: BC Managed Care – PPO | Source: Ambulatory Visit | Attending: Surgery | Admitting: Surgery

## 2012-05-10 DIAGNOSIS — Z01810 Encounter for preprocedural cardiovascular examination: Secondary | ICD-10-CM | POA: Insufficient documentation

## 2012-05-10 DIAGNOSIS — Z01818 Encounter for other preprocedural examination: Secondary | ICD-10-CM | POA: Insufficient documentation

## 2012-05-10 DIAGNOSIS — Z01811 Encounter for preprocedural respiratory examination: Secondary | ICD-10-CM | POA: Insufficient documentation

## 2012-05-10 HISTORY — DX: Other malformations of cerebral vessels: Q28.3

## 2012-05-10 HISTORY — DX: Sleep apnea, unspecified: G47.30

## 2012-05-10 HISTORY — DX: Psoriasis vulgaris: L40.0

## 2012-05-10 HISTORY — DX: Nausea with vomiting, unspecified: R11.2

## 2012-05-10 HISTORY — DX: Lactose intolerance, unspecified: E73.9

## 2012-05-10 HISTORY — DX: Other specified postprocedural states: Z98.890

## 2012-05-10 LAB — COMPREHENSIVE METABOLIC PANEL
ALT: 10 U/L (ref 0–35)
AST: 15 U/L (ref 0–37)
Albumin: 3.8 g/dL (ref 3.5–5.2)
Alkaline Phosphatase: 82 U/L (ref 39–117)
Potassium: 4 mEq/L (ref 3.5–5.1)
Sodium: 140 mEq/L (ref 135–145)
Total Protein: 7 g/dL (ref 6.0–8.3)

## 2012-05-10 LAB — CBC
Hemoglobin: 12.9 g/dL (ref 12.0–15.0)
MCHC: 34.2 g/dL (ref 30.0–36.0)
Platelets: 228 10*3/uL (ref 150–400)
RDW: 12.9 % (ref 11.5–15.5)

## 2012-05-10 LAB — DIFFERENTIAL
Eosinophils Absolute: 0.1 10*3/uL (ref 0.0–0.7)
Eosinophils Relative: 2 % (ref 0–5)
Lymphs Abs: 2.7 10*3/uL (ref 0.7–4.0)
Monocytes Relative: 9 % (ref 3–12)

## 2012-05-10 LAB — SURGICAL PCR SCREEN: Staphylococcus aureus: POSITIVE — AB

## 2012-05-10 NOTE — Patient Instructions (Signed)
[name]  05/10/2012   Your procedure is scheduled on:  05-20-2012  Report to [facility] at 0800  AM.  Call this number if you have problems the morning of surgery: [phone]   Remember:   Do not eat food or drink liquids:After Midnight.  .  Take these medicines the morning of surgery with A SIP OF WATER: gabapentin, xanax if needed   Do not wear jewelry or make up.  Do not wear lotions, powders, or perfumes.Do not wear deodorant.    Do not bring valuables to the hospital.  Contacts, dentures or bridgework may not be worn into surgery.  Leave suitcase in the car. After surgery it may be brought to your room.  For patients admitted to the hospital, checkout time is 11:00 AM the day    discharge                             Special Instructions: CHG Shower Use Special Wash: 1/2 bottle night before surgery and 1/2 bottle morning of surgery, use regular soap on face and front and back private area. ZNo shaving for 2 days before showers.   Please read over the following fact sheets that you were given: MRSA Information  Cain Sieve [facility] pre op nurse phone number [phone], call if needed

## 2012-05-10 NOTE — Pre-Procedure Instructions (Signed)
Chest 2 view xray results routed to dr cornett inbasket

## 2012-05-11 ENCOUNTER — Other Ambulatory Visit (INDEPENDENT_AMBULATORY_CARE_PROVIDER_SITE_OTHER): Payer: Self-pay | Admitting: Surgery

## 2012-05-11 MED ORDER — DEXTROSE 5 % IV SOLN: 3.0000 g | INTRAVENOUS | Status: DC

## 2012-05-13 ENCOUNTER — Other Ambulatory Visit (INDEPENDENT_AMBULATORY_CARE_PROVIDER_SITE_OTHER): Payer: Self-pay

## 2012-05-13 DIAGNOSIS — Z01811 Encounter for preprocedural respiratory examination: Secondary | ICD-10-CM

## 2012-05-15 ENCOUNTER — Other Ambulatory Visit (HOSPITAL_COMMUNITY): Payer: BC Managed Care – PPO

## 2012-05-16 ENCOUNTER — Ambulatory Visit (HOSPITAL_COMMUNITY)
Admission: RE | Admit: 2012-05-16 | Discharge: 2012-05-16 | Disposition: A | Discharge status: Home / Self Care | Payer: BC Managed Care – PPO | Source: Ambulatory Visit | Attending: Surgery | Admitting: Surgery

## 2012-05-16 DIAGNOSIS — J984 Other disorders of lung: Secondary | ICD-10-CM | POA: Insufficient documentation

## 2012-05-16 DIAGNOSIS — Z01811 Encounter for preprocedural respiratory examination: Secondary | ICD-10-CM

## 2012-05-16 DIAGNOSIS — J9819 Other pulmonary collapse: Secondary | ICD-10-CM | POA: Insufficient documentation

## 2012-05-16 DIAGNOSIS — E041 Nontoxic single thyroid nodule: Secondary | ICD-10-CM | POA: Insufficient documentation

## 2012-05-17 ENCOUNTER — Encounter (INDEPENDENT_AMBULATORY_CARE_PROVIDER_SITE_OTHER): Payer: Self-pay

## 2012-05-17 ENCOUNTER — Ambulatory Visit: Payer: BC Managed Care – PPO | Admitting: Pulmonary Disease

## 2012-05-17 NOTE — Progress Notes (Signed)
Walk in--  [name] comes in today for her Chest CT Results-- (Negative for pulmonary nodule, mass, or acute findings in the lungs).  [name] made aware that Dr. Luisa Hart has not reviewed results and the result given today was preliminary and we would give her a call once Dr. Luisa Hart has reviewed and finalized.

## 2012-05-20 ENCOUNTER — Encounter (HOSPITAL_COMMUNITY): Payer: Self-pay | Admitting: *Deleted

## 2012-05-20 ENCOUNTER — Ambulatory Visit (HOSPITAL_COMMUNITY): Payer: BC Managed Care – PPO | Admitting: Anesthesiology

## 2012-05-20 ENCOUNTER — Encounter (HOSPITAL_COMMUNITY): Payer: Self-pay | Admitting: Anesthesiology

## 2012-05-20 ENCOUNTER — Inpatient Hospital Stay (HOSPITAL_COMMUNITY)
Admission: RE | Admit: 2012-05-20 | Discharge: 2012-05-22 | DRG: 160 | Disposition: A | Discharge status: Home / Self Care | Payer: BC Managed Care – PPO | Source: Ambulatory Visit | Attending: Surgery | Admitting: Surgery

## 2012-05-20 ENCOUNTER — Encounter (HOSPITAL_COMMUNITY)
Admission: RE | Disposition: A | Discharge status: Home / Self Care | Payer: Self-pay | Source: Ambulatory Visit | Attending: Surgery

## 2012-05-20 DIAGNOSIS — R112 Nausea with vomiting, unspecified: Secondary | ICD-10-CM

## 2012-05-20 DIAGNOSIS — R634 Abnormal weight loss: Secondary | ICD-10-CM

## 2012-05-20 DIAGNOSIS — L259 Unspecified contact dermatitis, unspecified cause: Secondary | ICD-10-CM

## 2012-05-20 DIAGNOSIS — R109 Unspecified abdominal pain: Secondary | ICD-10-CM

## 2012-05-20 DIAGNOSIS — Z87891 Personal history of nicotine dependence: Secondary | ICD-10-CM

## 2012-05-20 DIAGNOSIS — Z79899 Other long term (current) drug therapy: Secondary | ICD-10-CM

## 2012-05-20 DIAGNOSIS — M069 Rheumatoid arthritis, unspecified: Secondary | ICD-10-CM | POA: Diagnosis present

## 2012-05-20 DIAGNOSIS — R197 Diarrhea, unspecified: Secondary | ICD-10-CM

## 2012-05-20 DIAGNOSIS — M25569 Pain in unspecified knee: Secondary | ICD-10-CM

## 2012-05-20 DIAGNOSIS — M25469 Effusion, unspecified knee: Secondary | ICD-10-CM

## 2012-05-20 DIAGNOSIS — M1710 Unilateral primary osteoarthritis, unspecified knee: Secondary | ICD-10-CM

## 2012-05-20 DIAGNOSIS — K432 Incisional hernia without obstruction or gangrene: Principal | ICD-10-CM | POA: Diagnosis present

## 2012-05-20 DIAGNOSIS — Z87442 Personal history of urinary calculi: Secondary | ICD-10-CM

## 2012-05-20 DIAGNOSIS — G4733 Obstructive sleep apnea (adult) (pediatric): Secondary | ICD-10-CM

## 2012-05-20 DIAGNOSIS — K219 Gastro-esophageal reflux disease without esophagitis: Secondary | ICD-10-CM | POA: Diagnosis present

## 2012-05-20 DIAGNOSIS — G2581 Restless legs syndrome: Secondary | ICD-10-CM

## 2012-05-20 DIAGNOSIS — J45909 Unspecified asthma, uncomplicated: Secondary | ICD-10-CM | POA: Diagnosis present

## 2012-05-20 DIAGNOSIS — K430 Incisional hernia with obstruction, without gangrene: Secondary | ICD-10-CM

## 2012-05-20 DIAGNOSIS — M23302 Other meniscus derangements, unspecified lateral meniscus, unspecified knee: Secondary | ICD-10-CM

## 2012-05-20 DIAGNOSIS — K589 Irritable bowel syndrome without diarrhea: Secondary | ICD-10-CM

## 2012-05-20 HISTORY — PX: INCISIONAL HERNIA REPAIR: SHX193

## 2012-05-20 SURGERY — REPAIR, HERNIA, INCISIONAL
Anesthesia: General | Site: Abdomen | Wound class: Clean

## 2012-05-20 MED ORDER — ENOXAPARIN SODIUM 40 MG/0.4ML ~~LOC~~ SOLN
40.0000 mg | SUBCUTANEOUS | Status: DC
  Administered 2012-05-21 – 2012-05-22 (×2): 40 mg via SUBCUTANEOUS
  Filled 2012-05-20 (×4): qty 0.4

## 2012-05-20 MED ORDER — CLINDAMYCIN PHOSPHATE 900 MG/50ML IV SOLN
900.0000 mg | Freq: Once | INTRAVENOUS | Status: AC
  Administered 2012-05-20: 900 mg via INTRAVENOUS

## 2012-05-20 MED ORDER — NEOSTIGMINE METHYLSULFATE 1 MG/ML IJ SOLN
INTRAMUSCULAR | Status: DC | PRN
  Administered 2012-05-20: 4 mg via INTRAVENOUS

## 2012-05-20 MED ORDER — BUPIVACAINE-EPINEPHRINE PF 0.25-1:200000 % IJ SOLN
INTRAMUSCULAR | Status: AC
  Filled 2012-05-20: qty 30

## 2012-05-20 MED ORDER — LIDOCAINE HCL 4 % MT SOLN
OROMUCOSAL | Status: DC | PRN
  Administered 2012-05-20: 4 mL via TOPICAL

## 2012-05-20 MED ORDER — VANCOMYCIN HCL IN DEXTROSE 1-5 GM/200ML-% IV SOLN
1000.0000 mg | INTRAVENOUS | Status: AC
  Administered 2012-05-20: 1000 mg via INTRAVENOUS

## 2012-05-20 MED ORDER — GABAPENTIN 300 MG PO CAPS
600.0000 mg | ORAL_CAPSULE | Freq: Two times a day (BID) | ORAL | Status: DC
  Administered 2012-05-20 – 2012-05-21 (×4): 600 mg via ORAL
  Filled 2012-05-20 (×6): qty 2

## 2012-05-20 MED ORDER — EPHEDRINE SULFATE 50 MG/ML IJ SOLN
INTRAMUSCULAR | Status: DC | PRN
  Administered 2012-05-20 (×2): 5 mg via INTRAVENOUS

## 2012-05-20 MED ORDER — ACETAMINOPHEN 10 MG/ML IV SOLN
INTRAVENOUS | Status: AC
  Filled 2012-05-20: qty 100

## 2012-05-20 MED ORDER — ALPRAZOLAM 0.5 MG PO TABS
0.5000 mg | ORAL_TABLET | Freq: Every evening | ORAL | Status: DC
  Administered 2012-05-20 – 2012-05-21 (×2): 0.5 mg via ORAL
  Filled 2012-05-20 (×2): qty 1

## 2012-05-20 MED ORDER — ONDANSETRON HCL 4 MG/2ML IJ SOLN: 4.0000 mg | Freq: Four times a day (QID) | INTRAMUSCULAR | Status: DC | PRN

## 2012-05-20 MED ORDER — ONDANSETRON HCL 4 MG/2ML IJ SOLN
4.0000 mg | Freq: Four times a day (QID) | INTRAMUSCULAR | Status: DC | PRN
  Administered 2012-05-21 (×2): 4 mg via INTRAVENOUS
  Filled 2012-05-20 (×2): qty 2

## 2012-05-20 MED ORDER — VANCOMYCIN HCL IN DEXTROSE 1-5 GM/200ML-% IV SOLN
INTRAVENOUS | Status: AC
  Filled 2012-05-20: qty 200

## 2012-05-20 MED ORDER — NALOXONE HCL 0.4 MG/ML IJ SOLN: 0.4000 mg | INTRAMUSCULAR | Status: DC | PRN

## 2012-05-20 MED ORDER — MIDAZOLAM HCL 5 MG/5ML IJ SOLN
INTRAMUSCULAR | Status: DC | PRN
  Administered 2012-05-20: 2 mg via INTRAVENOUS
  Administered 2012-05-20: 1 mg via INTRAVENOUS

## 2012-05-20 MED ORDER — CLINDAMYCIN PHOSPHATE 300 MG/50ML IV SOLN
300.0000 mg | Freq: Once | INTRAVENOUS | Status: AC
  Administered 2012-05-20: 300 mg via INTRAVENOUS
  Filled 2012-05-20 (×2): qty 50

## 2012-05-20 MED ORDER — DEXAMETHASONE SODIUM PHOSPHATE 10 MG/ML IJ SOLN
INTRAMUSCULAR | Status: DC | PRN
  Administered 2012-05-20: 10 mg via INTRAVENOUS

## 2012-05-20 MED ORDER — VERAPAMIL HCL 40 MG PO TABS
40.0000 mg | ORAL_TABLET | Freq: Every day | ORAL | Status: DC
  Administered 2012-05-20 – 2012-05-21 (×2): 40 mg via ORAL
  Filled 2012-05-20 (×3): qty 1

## 2012-05-20 MED ORDER — HYDROMORPHONE 0.3 MG/ML IV SOLN
INTRAVENOUS | Status: DC
  Administered 2012-05-20: 2.5 mg via INTRAVENOUS
  Administered 2012-05-20: 0.5 mg via INTRAVENOUS
  Administered 2012-05-20: 0.72 mg via INTRAVENOUS
  Administered 2012-05-20: 14:00:00 via INTRAVENOUS
  Administered 2012-05-20: 0.5 mg via INTRAVENOUS
  Administered 2012-05-21: 2.25 mg via INTRAVENOUS
  Administered 2012-05-21: 1.2 mg via INTRAVENOUS
  Filled 2012-05-20: qty 25

## 2012-05-20 MED ORDER — LIDOCAINE HCL (CARDIAC) 20 MG/ML IV SOLN
INTRAVENOUS | Status: DC | PRN
  Administered 2012-05-20: 60 mg via INTRAVENOUS

## 2012-05-20 MED ORDER — LACTATED RINGERS IV SOLN
INTRAVENOUS | Status: DC
  Administered 2012-05-20: 1000 mL via INTRAVENOUS
  Administered 2012-05-20: 12:00:00 via INTRAVENOUS

## 2012-05-20 MED ORDER — PROMETHAZINE HCL 25 MG/ML IJ SOLN: 6.2500 mg | INTRAMUSCULAR | Status: DC | PRN

## 2012-05-20 MED ORDER — DIPHENHYDRAMINE HCL 50 MG/ML IJ SOLN
INTRAMUSCULAR | Status: AC
  Filled 2012-05-20: qty 1

## 2012-05-20 MED ORDER — GLYCOPYRROLATE 0.2 MG/ML IJ SOLN
INTRAMUSCULAR | Status: DC | PRN
  Administered 2012-05-20: .6 mg via INTRAVENOUS

## 2012-05-20 MED ORDER — OXYCODONE-ACETAMINOPHEN 5-325 MG PO TABS
1.0000 | ORAL_TABLET | ORAL | Status: DC | PRN
  Administered 2012-05-22: 2 via ORAL
  Filled 2012-05-20: qty 2

## 2012-05-20 MED ORDER — FENTANYL CITRATE 0.05 MG/ML IJ SOLN: 25.0000 ug | INTRAMUSCULAR | Status: DC | PRN

## 2012-05-20 MED ORDER — LACTATED RINGERS IV SOLN: INTRAVENOUS | Status: DC

## 2012-05-20 MED ORDER — SUCCINYLCHOLINE CHLORIDE 20 MG/ML IJ SOLN
INTRAMUSCULAR | Status: DC | PRN
  Administered 2012-05-20: 100 mg via INTRAVENOUS

## 2012-05-20 MED ORDER — FENTANYL CITRATE 0.05 MG/ML IJ SOLN
INTRAMUSCULAR | Status: DC | PRN
  Administered 2012-05-20: 100 ug via INTRAVENOUS
  Administered 2012-05-20 (×3): 50 ug via INTRAVENOUS
  Administered 2012-05-20: 100 ug via INTRAVENOUS
  Administered 2012-05-20: 50 ug via INTRAVENOUS
  Administered 2012-05-20 (×2): 100 ug via INTRAVENOUS
  Administered 2012-05-20: 50 ug via INTRAVENOUS
  Administered 2012-05-20: 100 ug via INTRAVENOUS

## 2012-05-20 MED ORDER — CLINDAMYCIN PHOSPHATE 900 MG/50ML IV SOLN
INTRAVENOUS | Status: AC
  Filled 2012-05-20: qty 50

## 2012-05-20 MED ORDER — SODIUM CHLORIDE 0.9 % IJ SOLN: 9.0000 mL | INTRAMUSCULAR | Status: DC | PRN

## 2012-05-20 MED ORDER — ONDANSETRON HCL 4 MG PO TABS
4.0000 mg | ORAL_TABLET | Freq: Four times a day (QID) | ORAL | Status: DC | PRN
  Administered 2012-05-22: 4 mg via ORAL
  Filled 2012-05-20: qty 1

## 2012-05-20 MED ORDER — PANTOPRAZOLE SODIUM 40 MG PO TBEC
40.0000 mg | DELAYED_RELEASE_TABLET | Freq: Every evening | ORAL | Status: DC
  Administered 2012-05-20 – 2012-05-21 (×2): 40 mg via ORAL
  Filled 2012-05-20 (×3): qty 1

## 2012-05-20 MED ORDER — KCL IN DEXTROSE-NACL 20-5-0.45 MEQ/L-%-% IV SOLN
INTRAVENOUS | Status: DC
  Administered 2012-05-20: 15:00:00 via INTRAVENOUS
  Administered 2012-05-21: 20 mL via INTRAVENOUS
  Administered 2012-05-21: 02:00:00 via INTRAVENOUS
  Filled 2012-05-20 (×3): qty 1000

## 2012-05-20 MED ORDER — PROPOFOL 10 MG/ML IV EMUL
INTRAVENOUS | Status: DC | PRN
  Administered 2012-05-20: 200 mg via INTRAVENOUS

## 2012-05-20 MED ORDER — ROCURONIUM BROMIDE 100 MG/10ML IV SOLN
INTRAVENOUS | Status: DC | PRN
  Administered 2012-05-20: 10 mg via INTRAVENOUS
  Administered 2012-05-20: 30 mg via INTRAVENOUS
  Administered 2012-05-20: 10 mg via INTRAVENOUS

## 2012-05-20 MED ORDER — METOCLOPRAMIDE HCL 5 MG/ML IJ SOLN
INTRAMUSCULAR | Status: DC | PRN
  Administered 2012-05-20: 10 mg via INTRAVENOUS

## 2012-05-20 MED ORDER — ONDANSETRON HCL 4 MG/2ML IJ SOLN
INTRAMUSCULAR | Status: DC | PRN
  Administered 2012-05-20: 4 mg via INTRAVENOUS

## 2012-05-20 MED ORDER — BUPIVACAINE-EPINEPHRINE 0.25% -1:200000 IJ SOLN
INTRAMUSCULAR | Status: DC | PRN
  Administered 2012-05-20: 30 mL

## 2012-05-20 MED ORDER — ACETAMINOPHEN 10 MG/ML IV SOLN
INTRAVENOUS | Status: DC | PRN
  Administered 2012-05-20: 1000 mg via INTRAVENOUS

## 2012-05-20 MED ORDER — ROPINIROLE HCL 0.5 MG PO TABS
0.5000 mg | ORAL_TABLET | Freq: Every day | ORAL | Status: DC
  Administered 2012-05-20 – 2012-05-21 (×2): 0.5 mg via ORAL
  Filled 2012-05-20 (×4): qty 1

## 2012-05-20 MED ORDER — DIPHENHYDRAMINE HCL 12.5 MG/5ML PO ELIX
12.5000 mg | ORAL_SOLUTION | Freq: Four times a day (QID) | ORAL | Status: DC | PRN
  Filled 2012-05-20: qty 5

## 2012-05-20 MED ORDER — HYDROMORPHONE 0.3 MG/ML IV SOLN
INTRAVENOUS | Status: AC
  Filled 2012-05-20: qty 25

## 2012-05-20 MED ORDER — DIPHENHYDRAMINE HCL 50 MG/ML IJ SOLN
12.5000 mg | Freq: Four times a day (QID) | INTRAMUSCULAR | Status: DC | PRN
  Administered 2012-05-20 (×2): 12.5 mg via INTRAVENOUS
  Filled 2012-05-20: qty 1

## 2012-05-20 SURGICAL SUPPLY — 45 items
ADH SKN CLS APL DERMABOND .7 (GAUZE/BANDAGES/DRESSINGS) ×1
APL SKNCLS STERI-STRIP NONHPOA (GAUZE/BANDAGES/DRESSINGS) ×1
BENZOIN TINCTURE PRP APPL 2/3 (GAUZE/BANDAGES/DRESSINGS) ×2 IMPLANT
BLADE HEX COATED 2.75 (ELECTRODE) ×2 IMPLANT
CANISTER SUCTION 2500CC (MISCELLANEOUS) ×2 IMPLANT
CLOTH BEACON ORANGE TIMEOUT ST (SAFETY) ×2 IMPLANT
DECANTER SPIKE VIAL GLASS SM (MISCELLANEOUS) ×2 IMPLANT
DERMABOND ADVANCED (GAUZE/BANDAGES/DRESSINGS) ×1
DERMABOND ADVANCED .7 DNX12 (GAUZE/BANDAGES/DRESSINGS) IMPLANT
DRAIN CHANNEL RND F F (WOUND CARE) ×1 IMPLANT
DRAPE LAPAROTOMY T 102X78X121 (DRAPES) ×2 IMPLANT
DRSG TEGADERM 4X4.75 (GAUZE/BANDAGES/DRESSINGS) IMPLANT
ELECT REM PT RETURN 9FT ADLT (ELECTROSURGICAL) ×2
ELECTRODE REM PT RTRN 9FT ADLT (ELECTROSURGICAL) ×1 IMPLANT
EVACUATOR SILICONE 100CC (DRAIN) ×1 IMPLANT
GLOVE BIOGEL PI IND STRL 7.0 (GLOVE) ×1 IMPLANT
GLOVE BIOGEL PI INDICATOR 7.0 (GLOVE) ×1
GLOVE INDICATOR 8.0 STRL GRN (GLOVE) ×4 IMPLANT
GLOVE SS BIOGEL STRL SZ 8 (GLOVE) ×1 IMPLANT
GLOVE SUPERSENSE BIOGEL SZ 8 (GLOVE) ×1
GOWN STRL NON-REIN LRG LVL3 (GOWN DISPOSABLE) ×2 IMPLANT
GOWN STRL REIN XL XLG (GOWN DISPOSABLE) ×4 IMPLANT
KIT BASIN OR (CUSTOM PROCEDURE TRAY) ×2 IMPLANT
MESH ULTRAPRO 6X6 15CM15CM (Mesh General) ×1 IMPLANT
NEEDLE HYPO 22GX1.5 SAFETY (NEEDLE) ×2 IMPLANT
NS IRRIG 1000ML POUR BTL (IV SOLUTION) ×2 IMPLANT
PACK GENERAL/GYN (CUSTOM PROCEDURE TRAY) ×2 IMPLANT
PEN SKIN MARKING BROAD (MISCELLANEOUS) ×2 IMPLANT
SPONGE DRAIN TRACH 4X4 STRL 2S (GAUZE/BANDAGES/DRESSINGS) ×1 IMPLANT
SPONGE GAUZE 4X4 12PLY (GAUZE/BANDAGES/DRESSINGS) ×2 IMPLANT
SPONGE LAP 18X18 X RAY DECT (DISPOSABLE) ×1 IMPLANT
STRIP CLOSURE SKIN 1/2X4 (GAUZE/BANDAGES/DRESSINGS) IMPLANT
SUCTION POOLE TIP (SUCTIONS) ×1 IMPLANT
SUT ETHILON 2 0 PS N (SUTURE) ×1 IMPLANT
SUT MNCRL AB 4-0 PS2 18 (SUTURE) ×2 IMPLANT
SUT NOVA 0 T19/GS 22DT (SUTURE) IMPLANT
SUT NOVA 1 T20/GS 25DT (SUTURE) ×4 IMPLANT
SUT NOVA NAB GS-21 0 18 T12 DT (SUTURE) ×2 IMPLANT
SUT PROLENE 0 CT 1 30 (SUTURE) IMPLANT
SUT PROLENE 0 CT 1 CR/8 (SUTURE) IMPLANT
SUT VIC AB 2-0 SH 27 (SUTURE) ×2
SUT VIC AB 2-0 SH 27X BRD (SUTURE) IMPLANT
SYR CONTROL 10ML LL (SYRINGE) ×2 IMPLANT
TAPE CLOTH SOFT 2X10 (GAUZE/BANDAGES/DRESSINGS) ×1 IMPLANT
TOWEL OR 17X26 10 PK STRL BLUE (TOWEL DISPOSABLE) ×2 IMPLANT

## 2012-05-20 NOTE — H&P (Signed)
[name]   MRN: [id number]   Description: 51 year old female  Provider: Dortha Schwalbe., MD  Department: [facility]        Diagnoses     Recurrent ventral incisional hernia   - Primary    553.21         Vitals - Last Recorded       BP Pulse Temp Resp Ht Wt    110/64 56 97.4 F (36.3 C) 14 5\' 5"  (1.651 m) 160 lb 6.4 oz (72.757 kg)         BMI              26.69 kg/m2                 Progress Notes   Patient ID: [name], female   DOB: Mar 02, 1961, 51 y.o.   MRN: [id number]   No chief complaint on file.   HPI [name] is a 51 y.o. female.   HPIPatient sent at the request of Dr.Luking  due to lower abdominal pain and a bulge that has been present for 6 months. She had a previous open ventral hernia repair with mesh in 2003 at [facility]. Over the last 6 months, she developed increasing abdominal pain at the operative site with a bulge. The pain is moderate in intensity becoming more common and frequent especially with exertion. A CT was done back in May for kidney stones which I have reviewed. There appears to be mesh and tacks extruded in the hernia sac. The radiologist does not  comment about this in his report.    Past Medical History   Diagnosis  Date   .  IBS (irritable bowel syndrome)         mixed   .  GERD (gastroesophageal reflux disease)     .  Allergic rhinitis     .  Rheumatoid arthritis  2010   .  Nephrolithiasis     .  Asthma     .  Eczema         Past Surgical History   Procedure  Date   .  Cholecystectomy  1981   .  Appendectomy  1981   .  Total abdominal hysterectomy     .  Incontinence surgery     .  Knee arthroscopy  1992       left   .  Foot surgery  1999       right   .  Hernia repair  2003   .  Kidney stone surgery  2001       Family History   Problem  Relation  Age of Onset   .  Diabetes  Maternal Grandmother     .  Diabetes  Brother     .  Diabetes  Paternal Grandmother     .  Diabetes   Maternal Grandfather     .  Diabetes  Paternal Grandfather     .  Heart disease  Brother     .  Heart disease  Other         Uncle   .  Arthritis  Maternal Grandmother     .  Breast cancer  Maternal Aunt     .  Ovarian cancer  Mother        Social History History   Substance Use Topics   .  Smoking status:  Former Smoker -- 2.0 packs/day for 30 years  Types:  Cigarettes       Quit date:  10/16/2000   .  Smokeless tobacco:  Not on file   .  Alcohol Use:  Yes         rare       Allergies   Allergen  Reactions   .  Amoxicillin         REACTION: GI upset   .  Aspirin         REACTION: severe gi upset   .  Ciprofloxacin         REACTION: angioedema, urticaria   .  Cosyntropin         REACTION: swelling, hives   .  Guaifenesin         REACTION: head rash   .  Moxifloxacin         REACTION: ???   .  Nsaids     .  Sulfonamide Derivatives         REACTION: swelling, tongue and hives       Current Outpatient Prescriptions   Medication  Sig  Dispense  Refill   .  conjugated estrogens (PREMARIN) vaginal cream  Place vaginally daily.          Marland Kitchen  etanercept (ENBREL SURECLICK) 50 MG/ML injection  Inject 50 mg into the skin once a week.           .  gabapentin (NEURONTIN) 300 MG capsule  Take 600 mg by mouth 2 (two) times daily.          .  Lidocaine 0.5 % AERO  Apply topically.         .  Misc Natural Products (FIBER 7) POWD  Take by mouth daily.           .  Multiple Vitamin (MULTIVITAMIN) capsule  Take 1 capsule by mouth daily.           .  NON FORMULARY  Lidocain Spray.  Use as directed for headaches.          .  ondansetron (ZOFRAN) 4 MG tablet  Take 4 mg by mouth every 6 (six) hours as needed.           .  pantoprazole (PROTONIX) 40 MG tablet  Take 1 tablet (40 mg total) by mouth daily.   30 tablet   3   .  Probiotic Product (PROBIOTIC FORMULA PO)  Take by mouth daily.           .  promethazine (PHENERGAN) 25 MG tablet  Take 25 mg by mouth every 6 (six) hours as  needed.           Marland Kitchen  rOPINIRole (REQUIP) 0.5 MG tablet  Take one tablet after dinner each night   30 tablet   11   .  traMADol (ULTRAM) 50 MG tablet  Take 1 to 2 tabs twice a day as needed.         .  verapamil (CALAN) 80 MG tablet  1/2  To 1 tablet at bedtime           .  rOPINIRole (REQUIP) 0.5 MG tablet  1 tablet after dinner each night   30 tablet   0      Review of Systems Review of Systems  Constitutional: Negative for fever, chills and unexpected weight change.  HENT: Negative for hearing loss, congestion, sore throat, trouble swallowing and voice change.   Eyes: Negative for visual disturbance.  Respiratory:  Negative for cough and wheezing.   Cardiovascular: Negative for chest pain, palpitations and leg swelling.  Gastrointestinal: Negative for nausea, vomiting, abdominal pain, diarrhea, constipation, blood in stool, abdominal distention and anal bleeding.  Genitourinary: Negative for hematuria, vaginal bleeding and difficulty urinating.  Musculoskeletal: Negative for arthralgias.  Skin: Negative for rash and wound.  Neurological: Negative for seizures, syncope and headaches.  Hematological: Negative for adenopathy. Does not bruise/bleed easily.  Psychiatric/Behavioral: Negative for confusion.    Blood pressure 110/64, pulse 56, temperature 97.4 F (36.3 C), resp. rate 14, height 5\' 5"  (1.651 m), weight 160 lb 6.4 oz (72.757 kg).   Physical Exam Physical Exam  Constitutional: She is oriented to person, place, and time. She appears well-developed and well-nourished.  HENT:   Head: Normocephalic and atraumatic.  Eyes: EOM are normal. Pupils are equal, round, and reactive to light.  Neck: Normal range of motion. Neck supple.  Cardiovascular: Normal rate and regular rhythm.   Pulmonary/Chest: Effort normal and breath sounds normal.  Abdominal: Soft. She exhibits no distension.    Musculoskeletal: Normal range of motion.  Neurological: She is alert and oriented to person,  place, and time.  Skin: Skin is warm and dry.  Psychiatric: She has a normal mood and affect. Her behavior is normal. Judgment and thought content normal.    Data Reviewed   CT Abdomen Pelvis Wo Contrast    Status: Final result          PACS Images      Show images for CT Abdomen Pelvis Wo Contrast        Study Result      *RADIOLOGY REPORT*    Clinical Data: Right-sided flank pain.  History of kidney stones and rheumatoid arthritis.  Post hysterectomy.    CT ABDOMEN AND PELVIS WITHOUT CONTRAST    Technique:  Multidetector CT imaging of the abdomen and pelvis was performed following the standard protocol without intravenous contrast.    Comparison: 03/23/2009 [facility] CT.    Findings: 1.5 mm right ureteral vesicle junction obstructing calculus.  Mild fullness of the right renal collecting system with pararenal fluid consistent with obstruction.    Previously noted left hydronephrosis has cleared.  Tiny nonobstructing left lower pole renal calculi.    Mild rotation of the right kidney.  Unenhanced imaging without focal renal lesion.    Combination of scarring and atelectasis lung bases more notable on the right.    Unenhanced imaging without focal liver, splenic, pancreatic or adrenal lesion.    Prior anterior abdominal/pelvic hernia repair with fatty containing hernia adjacent to the mesh.    No bowel inflammatory process noted.  Appendix not visualized.    No bony destructive lesion.  Degenerative changes lower lumbar spine.    Mild atherosclerotic type changes abdominal aorta without aneurysmal dilation.  Mild atherosclerotic type changes right femoral artery.  No pelvic or abdominal adenopathy.  Gallbladder not visualized.    IMPRESSION: 1.5 mm right ureteral vesicle junction obstructing calculus.  Mild fullness of the right renal collecting system with pararenal fluid consistent with obstruction.    Please see above.    Original  Report Authenticated By: Fuller Canada, M.D.          External Result Report      External Result Report          Imaging      Imaging Information          Signed by  Signed Date/Time    Phone Pager     Fuller Canada 07/21/2011  9:21 AM EDT [phone]              Assessment Recurrent lower abdominal ventral hernia  With extruded mesh in new hernia sac   Plan Open repair of recurrent incisional hernia.The risk of hernia repair include bleeding,  Infection,   Recurrence of the hernia,  Mesh use, chronic pain,  Organ injury,  Bowel injury,  Bladder injury,   nerve injury with numbness around the incision,  Death,  and worsening of preexisting  medical problems.  The alternatives to surgery have been discussed as well..  Long term expectations of both operative and non operative treatments have been discussed. Laparoscopic and open repair discussed. The patient agrees to proceed.Will obtain records from previous surgery from [facility].  Recurrence rate of 50 % for  Recurrent repair.  She will hold Embrel a week before surgery.       Kayleah Appleyard A. 05/20/2012

## 2012-05-20 NOTE — Op Note (Signed)
Preoperative diagnoses: Recurrent incisional hernia  Postoperative diagnosis: Same  Procedure: Open repair of recurrent seasonal hernia with ultra pro mesh submuscular position 15 cm x 15 cm  Surgeon: Harriette Bouillon M.D.  Anesthesia: Gen. Endotracheal anesthesia with 0.25% Sensorcaine local  EBL: 50 cc  Drains: 19 French round drain to submuscular position  Specimen: None  IV fluids: 2000 cc crystalloid  Indications for procedure: The patient is a 51 year old female with recurrent incisional hernia from a previous C-section hernia repair back in 2006 onlay mesh. She has developed a bulge over her lower abdomen just above the pubis. I examined and discussed with the patient treatment options. Discussed operative and nonoperative options. Also discussed laparoscopic and open options. I felt an open repair the best in this situation.The risk of hernia repair include bleeding,  Infection,   Recurrence of the hernia,  Mesh use, chronic pain,  Organ injury,  Bowel injury,  Bladder injury,   nerve injury with numbness around the incision,  Death,  and worsening of preexisting  medical problems.  The alternatives to surgery have been discussed as well..  Long term expectations of both operative and non operative treatments have been discussed.   The patient agrees to proceed.  Description of procedure: The patient was met in the holding area and questions were answered. She's taken back to the operating room placed upon the operating room table. General anesthesia was initiated. Foley catheter was placed in a sterile conditions. He received antibiotics. The abdomen was prepped and draped in sterile fashion and timeout was done. Lower midline incision was made from just below the umbilicus down to the pubic symphysis. Just below the umbilicus the fascia was opened. The preperitoneal space was entered without difficulty. Onlay mesh was encountered I cut through this easily. There is a recurrent hernia to  the left of midline around the lateral edge the mesh measuring 3 cm. Omentum was incarcerated in this and I reduce this. There is a small opening in the peritoneal lining I closed with 3-0 Vicryl. I then used a 15 cm x 15 cm piece of ultra pro mesh in place this in a sub-muscular position packing it down to the pubis and it circumferentially to the lateral portion the rectus the sutures tied anterior to the rectus sheath. There was 4 cm of underlying hemostasis was excellent. This was done with #1 and #2 Novafil suture. A drain was placed below the fascia on top of the mesh. I then closed the midline including the old mesh which I left in place since was well incorporated with #1 Novafil suture. The subcutaneous tissues were irrigated. Subcutaneous fat approximated with 2-0 Vicryl. 4-0 Monocryl used to close the skin a subcuticular fashion. Dermabond applied. Drain placed to bulb suction. All final counts sponge, needle and this was found to be correct. Foley catheter removed. Patient extubated taken recovery in satisfactory condition.

## 2012-05-20 NOTE — Progress Notes (Signed)
Patient has dangled and ambulated in the room. Voiding. Tolerating clears. No c/o n/v.. Pain controlled by PCA.

## 2012-05-20 NOTE — Progress Notes (Signed)
CPAP 14 cm set up with humidity.  Pt is using her full face mask from home with a 2 LPM nasal cannula in place.  (ETCO2 nasal cannula)

## 2012-05-20 NOTE — Progress Notes (Signed)
Placed pt. On cpap. Pt. Is tolerating well. Pt. States she is comfortable with the pressure. RT to monitor.

## 2012-05-20 NOTE — Anesthesia Preprocedure Evaluation (Signed)
Anesthesia Evaluation    History of Anesthesia Complications (+) PONV  Airway Mallampati: II TM Distance: >3 FB Neck ROM: Full    Dental  (+) Teeth Intact and Dental Advisory Given   Pulmonary asthma , sleep apnea and Continuous Positive Airway Pressure Ventilation , former smoker,  breath sounds clear to auscultation  Pulmonary exam normal       Cardiovascular negative cardio ROS  Rate:Normal     Neuro/Psych  Headaches, History of CVM; Rx with Verapamil    GI/Hepatic Neg liver ROS, GERD-  Medicated,  Endo/Other    Renal/GU negative Renal ROS  negative genitourinary   Musculoskeletal  (+) Arthritis -,   Abdominal   Peds  Hematology negative hematology ROS (+)   Anesthesia Other Findings   Reproductive/Obstetrics negative OB ROS                           Anesthesia Physical Anesthesia Plan  ASA: II  Anesthesia Plan: General   Post-op Pain Management:    Induction: Intravenous  Airway Management Planned: Oral ETT  Additional Equipment:   Intra-op Plan:   Post-operative Plan: Extubation in OR  Informed Consent: I have reviewed the patients History and Physical, chart, labs and discussed the procedure including the risks, benefits and alternatives for the proposed anesthesia with the patient or authorized representative who has indicated his/her understanding and acceptance.   Dental advisory given  Plan Discussed with: CRNA  Anesthesia Plan Comments:         Anesthesia Quick Evaluation

## 2012-05-20 NOTE — Transfer of Care (Signed)
Immediate Anesthesia Transfer of Care Note  Patient: [name]  Procedure(s) Performed: Procedure(s) (LRB): HERNIA REPAIR INCISIONAL (N/A) INSERTION OF MESH (N/A)  Patient Location: PACU  Anesthesia Type: General  Level of Consciousness: awake, alert , oriented and patient cooperative  Airway & Oxygen Therapy: Patient Spontanous Breathing and Patient connected to face mask oxygen  Post-op Assessment: Report given to PACU RN, Post -op Vital signs reviewed and stable and Patient moving all extremities X 4  Post vital signs: stable  Complications: No apparent anesthesia complications

## 2012-05-20 NOTE — Interval H&P Note (Signed)
History and Physical Interval Note:  05/20/2012 10:01 AM  [name]  has presented today for surgery, with the diagnosis of incisional hernia  The various methods of treatment have been discussed with the patient and family. After consideration of risks, benefits and other options for treatment, the patient has consented to  Procedure(s) (LRB): HERNIA REPAIR INCISIONAL (N/A) INSERTION OF MESH (N/A) as a surgical intervention .  The patient's history has been reviewed, patient examined, no change in status, stable for surgery.  I have reviewed the patient's chart and labs.  Questions were answered to the patient's satisfaction.     [name] A.

## 2012-05-20 NOTE — Anesthesia Postprocedure Evaluation (Signed)
  Anesthesia Post-op Note  Patient: [name]  Procedure(s) Performed: Procedure(s) (LRB): HERNIA REPAIR INCISIONAL (N/A) INSERTION OF MESH (N/A)  Patient Location: PACU  Anesthesia Type: General  Level of Consciousness: awake and alert   Airway and Oxygen Therapy: Patient Spontanous Breathing  Post-op Pain: mild  Post-op Assessment: Post-op Vital signs reviewed, Patient's Cardiovascular Status Stable, Respiratory Function Stable, Patent Airway and No signs of Nausea or vomiting  Post-op Vital Signs: stable  Complications: No apparent anesthesia complications

## 2012-05-21 ENCOUNTER — Encounter (HOSPITAL_COMMUNITY): Payer: Self-pay | Admitting: Surgery

## 2012-05-21 LAB — BASIC METABOLIC PANEL
CO2: 22 mEq/L (ref 19–32)
Calcium: 8.6 mg/dL (ref 8.4–10.5)
Creatinine, Ser: 0.4 mg/dL — ABNORMAL LOW (ref 0.50–1.10)
GFR calc non Af Amer: >90 mL/min (ref >90)
Glucose, Bld: 133 mg/dL — ABNORMAL HIGH (ref 70–99)

## 2012-05-21 LAB — CBC
MCH: 31.2 pg (ref 26.0–34.0)
MCHC: 34 g/dL (ref 30.0–36.0)
MCV: 91.9 fL (ref 78.0–100.0)
Platelets: 183 10*3/uL (ref 150–400)
RDW: 12.9 % (ref 11.5–15.5)

## 2012-05-21 MED ORDER — CHLORHEXIDINE GLUCONATE CLOTH 2 % EX PADS
6.0000 | MEDICATED_PAD | Freq: Every day | CUTANEOUS | Status: DC
  Administered 2012-05-21: 6 via TOPICAL

## 2012-05-21 MED ORDER — OXYCODONE HCL 5 MG PO TABS
10.0000 mg | ORAL_TABLET | ORAL | Status: DC | PRN
  Administered 2012-05-21 – 2012-05-22 (×4): 10 mg via ORAL
  Filled 2012-05-21 (×4): qty 2

## 2012-05-21 MED ORDER — DIPHENHYDRAMINE HCL 25 MG PO CAPS
25.0000 mg | ORAL_CAPSULE | Freq: Four times a day (QID) | ORAL | Status: DC | PRN
  Administered 2012-05-21 (×2): 25 mg via ORAL
  Filled 2012-05-21 (×2): qty 1

## 2012-05-21 MED ORDER — HYDROMORPHONE HCL PF 1 MG/ML IJ SOLN
1.0000 mg | INTRAMUSCULAR | Status: DC | PRN
  Administered 2012-05-21 – 2012-05-22 (×3): 1 mg via INTRAVENOUS
  Filled 2012-05-21 (×3): qty 1

## 2012-05-21 NOTE — Care Management Note (Signed)
    Page 1 of 1   05/21/2012     2:16:04 PM   CARE MANAGEMENT NOTE 05/21/2012  Patient:  Gwendolyn,[name]   Account Number:  [id number]  Date Initiated:  05/21/2012  Documentation initiated by:  Lorenda Ishihara  Subjective/Objective Assessment:   51 yo female admitted s/p hernia repair. PTA lived at home with spouse.     Action/Plan:   Anticipated DC Date:  05/24/2012   Anticipated DC Plan:  HOME/SELF CARE      DC Planning Services  CM consult      Choice offered to / List presented to:             Status of service:  Completed, signed off Medicare Important Message given?   (If response is "NO", the following Medicare IM given date fields will be blank) Date Medicare IM given:   Date Additional Medicare IM given:    Discharge Disposition:  HOME/SELF CARE  Per UR Regulation:  Reviewed for med. necessity/level of care/duration of stay  If discussed at Long Length of Stay Meetings, dates discussed:    Comments:

## 2012-05-21 NOTE — Progress Notes (Signed)
1 Day Post-Op  Subjective: Sore.  Ice packs helping.  Objective: Vital signs in last 24 hours: Temp:  [97.3 F (36.3 C)-98.2 F (36.8 C)] 97.9 F (36.6 C) (08/06 0557) Pulse Rate:  [48-66] 58  (08/06 0557) Resp:  [8-21] 18  (08/06 0557) BP: (104-121)/(57-75) 121/74 mmHg (08/06 0557) SpO2:  [96 %-100 %] 98 % (08/06 0557) Weight:  [160 lb 4.8 oz (72.712 kg)] 160 lb 4.8 oz (72.712 kg) (08/05 1426) Last BM Date: 05/20/12  Intake/Output from previous day: 08/05 0701 - 08/06 0700 In: 4665 [P.O.:840; I.V.:3825] Out: 3425 [Urine:3275; Drains:50; Blood:100] Intake/Output this shift:    Incision/Wound:bruising noted.  Wound intact.  Lab Results:   [facility] 05/21/12 0419  WBC 13.6*  HGB 12.4  HCT 36.5  PLT 183   BMET  Basename 05/21/12 0419  NA 138  K 4.0  CL 107  CO2 22  GLUCOSE 133*  BUN 6  CREATININE 0.40*  CALCIUM 8.6   PT/INR No results found for this basename: LABPROT:2,INR:2 in the last 72 hours ABG No results found for this basename: PHART:2,PCO2:2,PO2:2,HCO3:2 in the last 72 hours  Studies/Results: No results found.  Anti-infectives: Anti-infectives     Start     Dose/Rate Route Frequency Ordered Stop   05/20/12 1700   clindamycin (CLEOCIN) IVPB 300 mg        300 mg 100 mL/hr over 30 Minutes Intravenous  Once 05/20/12 1438 05/20/12 1835   05/20/12 0800   clindamycin (CLEOCIN) IVPB 900 mg        900 mg 100 mL/hr over 30 Minutes Intravenous  Once 05/20/12 0746 05/20/12 1040   05/20/12 0746   vancomycin (VANCOCIN) IVPB 1000 mg/200 mL premix        1,000 mg 200 mL/hr over 60 Minutes Intravenous 120 min pre-op 05/20/12 0746 05/20/12 1055          Assessment/Plan: s/p Procedure(s) (LRB): HERNIA REPAIR INCISIONAL (N/A) INSERTION OF MESH (N/A) Advance diet Plan for discharge tomorrow  LOS: 1 day    [name] A. 05/21/2012

## 2012-05-21 NOTE — Progress Notes (Signed)
Pt placed on cpap 16 cmH2O per her home setting. Pt has her home mask & tolerating well.  Jacqulynn Cadet RRT

## 2012-05-22 MED ORDER — POLYETHYLENE GLYCOL 3350 17 G PO PACK: 17.0000 g | PACK | Freq: Two times a day (BID) | ORAL | Status: AC

## 2012-05-22 MED ORDER — POLYETHYLENE GLYCOL 3350 17 G PO PACK
17.0000 g | PACK | Freq: Two times a day (BID) | ORAL | Status: DC
  Filled 2012-05-22 (×2): qty 1

## 2012-05-22 MED ORDER — OXYCODONE HCL 10 MG PO TABS: 10.0000 mg | ORAL_TABLET | ORAL | Status: AC | PRN

## 2012-05-22 NOTE — Progress Notes (Signed)
Pt discharged to home with husband provided discharge instructions and prescriptions along with handouts. Provided pt with output sheet for JP and did JP drain instruction. Pt verbalized understanding of discharge information. Pt stable. Pt transported by [facility] IV removed and documented. Annitta Needs, RN

## 2012-05-22 NOTE — Discharge Summary (Signed)
Physician Discharge Summary  Patient ID: [name] MRN: [id number] DOB/AGE: [birth year] 53 y.o.  Admit date: 05/20/2012 Discharge date: 05/22/2012  Admission Diagnoses: recurrent incisional hernia  Discharge Diagnoses: same Active Problems:  * No active hospital problems. *    Discharged Condition: good  Hospital Course: unremarkable.  Diet advanced and patient tolerating pain meds by mouth.  Incision intact and clean.Vitals stable.  Consults: None  Significant Diagnostic Studies: none  Treatments: surgery: open incisional hernia repair with mesh  Discharge Exam: Blood pressure 125/70, pulse 57, temperature 97.7 F (36.5 C), temperature source Oral, resp. rate 18, height 5\' 5"  (1.651 m), weight 160 lb 4.8 oz (72.712 kg), SpO2 96.00%. Incision/Wound:some bruising noted.  Clean and intact.  Disposition: 01-Home or Self Care  Discharge Orders    Future Orders Please Complete By Expires   Diet - low sodium heart healthy      Increase activity slowly      Discharge instructions      Comments:   Empty drain daily.  Ok to shower.  See hernia post  Op sheet     Medication List  As of 05/22/2012  7:42 AM   TAKE these medications         ALPRAZolam 0.5 MG tablet   Commonly known as: XANAX   Take 0.5 mg by mouth every evening.      ALPRAZolam 0.5 MG tablet   Commonly known as: XANAX   Take 0.25 mg by mouth as needed.      ENBREL SURECLICK 50 MG/ML injection   Generic drug: etanercept   Inject 50 mg into the skin once a week.      Fiber 7 Powd   Take by mouth every morning.      gabapentin 300 MG capsule   Commonly known as: NEURONTIN   Take 600 mg by mouth 2 (two) times daily.      multivitamin capsule   Take 1 capsule by mouth daily.      NON FORMULARY   Lidocaine Spray.  Use as directed for headaches.      ondansetron 4 MG tablet   Commonly known as: ZOFRAN   Take 4 mg by mouth every 6 (six) hours as needed. For nausea      Oxycodone HCl 10 MG Tabs   Take 1 tablet (10 mg total) by mouth every 4 (four) hours as needed.      pantoprazole 40 MG tablet   Commonly known as: PROTONIX   Take 40 mg by mouth every evening.      polyethylene glycol packet   Commonly known as: MIRALAX / GLYCOLAX   Take 17 g by mouth 2 (two) times daily.      PREMARIN vaginal cream   Generic drug: conjugated estrogens   Place 1 g vaginally at bedtime.      PROBIOTIC FORMULA PO   Take 1 tablet by mouth every morning.      promethazine 25 MG tablet   Commonly known as: PHENERGAN   Take 25 mg by mouth every 6 (six) hours as needed. For nausea      rOPINIRole 0.5 MG tablet   Commonly known as: REQUIP   1 tablet after dinner each night      rOPINIRole 0.5 MG tablet   Commonly known as: REQUIP   Take 0.5 mg by mouth at bedtime. Take one tablet after dinner each night      traMADol 50 MG tablet   Commonly known as: Janean Sark  Take 50-100 mg by mouth 2 (two) times daily as needed. For pain      verapamil 80 MG tablet   Commonly known as: CALAN   Take 40-80 mg by mouth at bedtime. Takes 1/2 of 80 mg tab at hs             Signed: Yanisa Goodgame A. 05/22/2012, 7:42 AM

## 2012-05-28 ENCOUNTER — Other Ambulatory Visit: Payer: Self-pay

## 2012-05-29 ENCOUNTER — Other Ambulatory Visit: Payer: Self-pay | Admitting: Pulmonary Disease

## 2012-05-29 MED ORDER — ROPINIROLE HCL 0.5 MG PO TABS: 0.5000 mg | ORAL_TABLET | Freq: Every day | ORAL | Status: DC

## 2012-05-29 NOTE — Telephone Encounter (Signed)
Faxed refill request received from [facility] for Ropinirole HCL 0.5. Patient last seen

## 2012-05-29 NOTE — Telephone Encounter (Signed)
Refill request sent in

## 2012-05-30 ENCOUNTER — Ambulatory Visit (INDEPENDENT_AMBULATORY_CARE_PROVIDER_SITE_OTHER): Payer: BC Managed Care – PPO | Admitting: Surgery

## 2012-05-30 ENCOUNTER — Encounter (INDEPENDENT_AMBULATORY_CARE_PROVIDER_SITE_OTHER): Payer: Self-pay | Admitting: Surgery

## 2012-05-30 VITALS — BP 114/80 | HR 57 | Temp 97.8°F | Ht 65.0 in | Wt 152.0 lb

## 2012-05-30 DIAGNOSIS — Z9889 Other specified postprocedural states: Secondary | ICD-10-CM

## 2012-05-30 MED ORDER — PROMETHAZINE HCL 12.5 MG PO TABS: 12.5000 mg | ORAL_TABLET | Freq: Four times a day (QID) | ORAL | Status: DC | PRN

## 2012-05-30 MED ORDER — HYDROCODONE-ACETAMINOPHEN 5-325 MG PO TABS: 1.0000 | ORAL_TABLET | Freq: Four times a day (QID) | ORAL | Status: AC | PRN

## 2012-05-30 MED ORDER — PANTOPRAZOLE SODIUM 40 MG PO TBEC: 40.0000 mg | DELAYED_RELEASE_TABLET | Freq: Every day | ORAL | Status: DC

## 2012-05-30 MED ORDER — DOXYCYCLINE HYCLATE 100 MG PO TABS: 100.0000 mg | ORAL_TABLET | Freq: Two times a day (BID) | ORAL | Status: AC

## 2012-05-30 NOTE — Patient Instructions (Signed)
Return 2 weeks.

## 2012-05-30 NOTE — Progress Notes (Signed)
Patient returns after open incisional hernia. Mesh. She's doing well. Drain removed today in the office.  Exam: Incision clean dry and intact. No redness or signs of drainage.  Impression status post repair of incisional hernia with mesh  Plan: Return in 2 weeks. Change pain medicines and Norco. Phenergan for nausea.

## 2012-06-06 ENCOUNTER — Encounter (INDEPENDENT_AMBULATORY_CARE_PROVIDER_SITE_OTHER): Payer: Self-pay

## 2012-06-19 ENCOUNTER — Encounter (INDEPENDENT_AMBULATORY_CARE_PROVIDER_SITE_OTHER): Payer: Self-pay | Admitting: Surgery

## 2012-06-19 ENCOUNTER — Encounter (INDEPENDENT_AMBULATORY_CARE_PROVIDER_SITE_OTHER): Payer: Self-pay

## 2012-06-19 ENCOUNTER — Ambulatory Visit (INDEPENDENT_AMBULATORY_CARE_PROVIDER_SITE_OTHER): Payer: BC Managed Care – PPO | Admitting: Surgery

## 2012-06-19 VITALS — BP 132/62 | HR 52 | Temp 98.6°F | Resp 18 | Ht 65.0 in | Wt 158.2 lb

## 2012-06-19 DIAGNOSIS — Z9889 Other specified postprocedural states: Secondary | ICD-10-CM

## 2012-06-19 NOTE — Patient Instructions (Signed)
Return to work as scheduled.  Be careful about lifting when you return to work.

## 2012-06-19 NOTE — Progress Notes (Signed)
Patient returns after open incisional hernia. Mesh. She's doing well.   Exam: Incision clean dry and intact. No redness or signs of drainage.  Impression status post repair of incisional hernia with mesh  Plan: Return as needed. RTW 07/03/2012 .

## 2012-06-27 ENCOUNTER — Other Ambulatory Visit: Payer: Self-pay | Admitting: Pulmonary Disease

## 2012-06-27 NOTE — Telephone Encounter (Signed)
Requip refill denied. Pt is due for OV and was last seen Aug 2012. There are no pending appts.

## 2012-07-22 ENCOUNTER — Other Ambulatory Visit: Payer: Self-pay

## 2012-07-23 NOTE — Telephone Encounter (Signed)
Please schedule OV prior to further refills

## 2012-07-23 NOTE — Telephone Encounter (Signed)
Pt will call back to set up an appointment

## 2012-07-30 ENCOUNTER — Other Ambulatory Visit: Payer: Self-pay | Admitting: Neurology

## 2012-07-30 DIAGNOSIS — R200 Anesthesia of skin: Secondary | ICD-10-CM

## 2012-08-08 ENCOUNTER — Ambulatory Visit
Admission: RE | Admit: 2012-08-08 | Discharge: 2012-08-08 | Disposition: A | Discharge status: Home / Self Care | Payer: BC Managed Care – PPO | Source: Ambulatory Visit | Attending: Neurology | Admitting: Neurology

## 2012-08-08 DIAGNOSIS — R200 Anesthesia of skin: Secondary | ICD-10-CM

## 2012-08-08 MED ORDER — GADOBENATE DIMEGLUMINE 529 MG/ML IV SOLN
15.0000 mL | Freq: Once | INTRAVENOUS | Status: AC | PRN
  Administered 2012-08-08: 15 mL via INTRAVENOUS

## 2012-08-23 ENCOUNTER — Other Ambulatory Visit: Payer: Self-pay | Admitting: Pulmonary Disease

## 2012-08-23 NOTE — Telephone Encounter (Signed)
Patient has not seen Dr. Shelle Iron since 05/2011. She has no pending appts with [facility]. She will need an appt for any refills.

## 2012-09-09 ENCOUNTER — Encounter: Payer: Self-pay | Admitting: Pulmonary Disease

## 2012-09-09 ENCOUNTER — Ambulatory Visit (INDEPENDENT_AMBULATORY_CARE_PROVIDER_SITE_OTHER): Payer: BC Managed Care – PPO | Admitting: Pulmonary Disease

## 2012-09-09 VITALS — BP 112/82 | HR 48 | Temp 97.8°F | Ht 65.0 in | Wt 166.0 lb

## 2012-09-09 DIAGNOSIS — G4733 Obstructive sleep apnea (adult) (pediatric): Secondary | ICD-10-CM

## 2012-09-09 DIAGNOSIS — G2581 Restless legs syndrome: Secondary | ICD-10-CM

## 2012-09-09 MED ORDER — ROPINIROLE HCL 0.5 MG PO TABS: 1.0000 mg | ORAL_TABLET | Freq: Every day | ORAL | Status: DC

## 2012-09-09 NOTE — Assessment & Plan Note (Signed)
The patient has done much better with Requip, but is now having some breakthrough in the evening.  It is unclear if this is related to RLS, or her other medical issues that can cause leg symptoms.  Will give her a trial of increased dose of Requip after dinner and see if she responds.

## 2012-09-09 NOTE — Patient Instructions (Addendum)
Can increase requip to 2 tabs after dinner to see if it helps your leg movements/discomfort. Try adjusting heater on humidifier to get the right amount of moisture.  Work on weight loss. followup with me in one year if doing well.

## 2012-09-09 NOTE — Assessment & Plan Note (Signed)
The patient is doing well with CPAP, and feels that she is sleeping adequately with appropriate daytime alertness.  I've asked her to continue on CPAP, and to work aggressively on weight loss.  I have also instructed her to make adjustments to her heated humidifier in order to change the moisture level.

## 2012-09-09 NOTE — Progress Notes (Signed)
  Subjective:    Patient ID: [name], female    DOB: 23-Jun-1961, 51 y.o.   MRN: [id number]  HPI The patient comes in today for followup of her obstructive sleep apnea and restless leg syndrome.  She is wearing CPAP compliantly, and overall is doing well with the device.  She is having some humidity issues, but has not adjusted her heater on the humidifier.  I have instructed her how to do this.  She is continuing to take Requip for her RLS, but is having some breakthrough symptoms at night.   Review of Systems  Constitutional: Negative for fever and unexpected weight change.  HENT: Positive for ear pain, congestion, sore throat, rhinorrhea, sneezing, postnasal drip and sinus pressure. Negative for nosebleeds, trouble swallowing and dental problem.   Eyes: Positive for redness and itching.  Respiratory: Positive for cough, chest tightness, shortness of breath and wheezing.   Cardiovascular: Positive for leg swelling. Negative for palpitations.  Gastrointestinal: Negative for nausea and vomiting.  Genitourinary: Negative for dysuria.  Musculoskeletal: Negative for joint swelling.  Skin: Negative for rash.  Neurological: Positive for headaches.  Hematological: Bruises/bleeds easily.  Psychiatric/Behavioral: Negative for dysphoric mood. The patient is not nervous/anxious.        Objective:   Physical Exam Overweight female in no acute distress Nose without purulence or discharge noted Mild excoriation noted over the bridge of the nose from CPAP mask Neck without lymphadenopathy or thyromegaly Lower extremities with mild edema, cyanosis Alert, does not appear to be overly sleepy, moves all 4 extremities.       Assessment & Plan:

## 2012-11-25 ENCOUNTER — Other Ambulatory Visit: Payer: Self-pay

## 2012-11-25 MED ORDER — PANTOPRAZOLE SODIUM 40 MG PO TBEC: 40.0000 mg | DELAYED_RELEASE_TABLET | Freq: Every day | ORAL | Status: DC

## 2013-03-24 ENCOUNTER — Ambulatory Visit
Admission: RE | Admit: 2013-03-24 | Discharge: 2013-03-24 | Disposition: A | Discharge status: Home / Self Care | Payer: BC Managed Care – PPO | Source: Ambulatory Visit | Attending: Family Medicine | Admitting: Family Medicine

## 2013-03-24 ENCOUNTER — Other Ambulatory Visit: Payer: Self-pay | Admitting: Family Medicine

## 2013-03-24 DIAGNOSIS — R112 Nausea with vomiting, unspecified: Secondary | ICD-10-CM

## 2013-03-24 DIAGNOSIS — R109 Unspecified abdominal pain: Secondary | ICD-10-CM

## 2013-03-24 MED ORDER — IOHEXOL 300 MG/ML  SOLN
30.0000 mL | Freq: Once | INTRAMUSCULAR | Status: AC | PRN
  Administered 2013-03-24: 30 mL via ORAL

## 2013-03-26 ENCOUNTER — Inpatient Hospital Stay (HOSPITAL_COMMUNITY): Payer: BC Managed Care – PPO

## 2013-03-26 ENCOUNTER — Emergency Department (HOSPITAL_COMMUNITY): Payer: BC Managed Care – PPO

## 2013-03-26 ENCOUNTER — Encounter (HOSPITAL_COMMUNITY): Payer: Self-pay | Admitting: *Deleted

## 2013-03-26 ENCOUNTER — Inpatient Hospital Stay (HOSPITAL_COMMUNITY)
Admission: EM | Admit: 2013-03-26 | Discharge: 2013-03-29 | DRG: 014 | Disposition: A | Discharge status: Home / Self Care | Payer: BC Managed Care – PPO | Attending: Internal Medicine | Admitting: Internal Medicine

## 2013-03-26 DIAGNOSIS — K219 Gastro-esophageal reflux disease without esophagitis: Secondary | ICD-10-CM | POA: Diagnosis present

## 2013-03-26 DIAGNOSIS — K589 Irritable bowel syndrome without diarrhea: Secondary | ICD-10-CM | POA: Diagnosis present

## 2013-03-26 DIAGNOSIS — I639 Cerebral infarction, unspecified: Secondary | ICD-10-CM

## 2013-03-26 DIAGNOSIS — M069 Rheumatoid arthritis, unspecified: Secondary | ICD-10-CM | POA: Diagnosis present

## 2013-03-26 DIAGNOSIS — Q283 Other malformations of cerebral vessels: Secondary | ICD-10-CM

## 2013-03-26 DIAGNOSIS — M1990 Unspecified osteoarthritis, unspecified site: Secondary | ICD-10-CM | POA: Diagnosis present

## 2013-03-26 DIAGNOSIS — R9431 Abnormal electrocardiogram [ECG] [EKG]: Secondary | ICD-10-CM | POA: Diagnosis present

## 2013-03-26 DIAGNOSIS — E860 Dehydration: Secondary | ICD-10-CM | POA: Diagnosis present

## 2013-03-26 DIAGNOSIS — R112 Nausea with vomiting, unspecified: Secondary | ICD-10-CM

## 2013-03-26 DIAGNOSIS — Z6825 Body mass index (BMI) 25.0-25.9, adult: Secondary | ICD-10-CM

## 2013-03-26 DIAGNOSIS — R42 Dizziness and giddiness: Secondary | ICD-10-CM

## 2013-03-26 DIAGNOSIS — D72829 Elevated white blood cell count, unspecified: Secondary | ICD-10-CM | POA: Diagnosis present

## 2013-03-26 DIAGNOSIS — N200 Calculus of kidney: Secondary | ICD-10-CM | POA: Diagnosis present

## 2013-03-26 DIAGNOSIS — Z87891 Personal history of nicotine dependence: Secondary | ICD-10-CM

## 2013-03-26 DIAGNOSIS — R262 Difficulty in walking, not elsewhere classified: Secondary | ICD-10-CM | POA: Diagnosis present

## 2013-03-26 DIAGNOSIS — R519 Headache, unspecified: Secondary | ICD-10-CM | POA: Diagnosis present

## 2013-03-26 DIAGNOSIS — Z7982 Long term (current) use of aspirin: Secondary | ICD-10-CM

## 2013-03-26 DIAGNOSIS — E876 Hypokalemia: Secondary | ICD-10-CM | POA: Diagnosis present

## 2013-03-26 DIAGNOSIS — M129 Arthropathy, unspecified: Secondary | ICD-10-CM | POA: Diagnosis present

## 2013-03-26 DIAGNOSIS — I6350 Cerebral infarction due to unspecified occlusion or stenosis of unspecified cerebral artery: Principal | ICD-10-CM

## 2013-03-26 DIAGNOSIS — H53149 Visual discomfort, unspecified: Secondary | ICD-10-CM | POA: Diagnosis present

## 2013-03-26 DIAGNOSIS — J209 Acute bronchitis, unspecified: Secondary | ICD-10-CM | POA: Diagnosis present

## 2013-03-26 DIAGNOSIS — I4949 Other premature depolarization: Secondary | ICD-10-CM | POA: Diagnosis present

## 2013-03-26 DIAGNOSIS — E663 Overweight: Secondary | ICD-10-CM | POA: Diagnosis present

## 2013-03-26 DIAGNOSIS — I739 Peripheral vascular disease, unspecified: Secondary | ICD-10-CM | POA: Diagnosis present

## 2013-03-26 DIAGNOSIS — R000 Tachycardia, unspecified: Secondary | ICD-10-CM | POA: Diagnosis present

## 2013-03-26 DIAGNOSIS — G4730 Sleep apnea, unspecified: Secondary | ICD-10-CM | POA: Diagnosis present

## 2013-03-26 HISTORY — DX: Cerebral infarction, unspecified: I63.9

## 2013-03-26 LAB — CBC
HCT: 46 % (ref 36.0–46.0)
Hemoglobin: 16.8 g/dL — ABNORMAL HIGH (ref 12.0–15.0)
MCH: 32.9 pg (ref 26.0–34.0)
MCV: 90.9 fL (ref 78.0–100.0)
Platelets: 280 10*3/uL (ref 150–400)
RBC: 5.06 MIL/uL (ref 3.87–5.11)
RBC: 4.44 MIL/uL (ref 3.87–5.11)
WBC: 16.3 10*3/uL — ABNORMAL HIGH (ref 4.0–10.5)
WBC: 13.9 10*3/uL — ABNORMAL HIGH (ref 4.0–10.5)

## 2013-03-26 LAB — CREATININE, SERUM: Creatinine, Ser: 0.61 mg/dL (ref 0.50–1.10)

## 2013-03-26 LAB — BASIC METABOLIC PANEL
BUN: 14 mg/dL (ref 6–23)
CO2: 20 mEq/L (ref 19–32)
Chloride: 100 mEq/L (ref 96–112)
Creatinine, Ser: 0.6 mg/dL (ref 0.50–1.10)
Glucose, Bld: 123 mg/dL — ABNORMAL HIGH (ref 70–99)

## 2013-03-26 LAB — POTASSIUM: Potassium: 2.7 mEq/L — CL (ref 3.5–5.1)

## 2013-03-26 MED ORDER — ROPINIROLE HCL 1 MG PO TABS
1.0000 mg | ORAL_TABLET | Freq: Every day | ORAL | Status: DC
  Administered 2013-03-26 – 2013-03-28 (×3): 1 mg via ORAL
  Filled 2013-03-26 (×4): qty 1

## 2013-03-26 MED ORDER — ALBUTEROL SULFATE HFA 108 (90 BASE) MCG/ACT IN AERS
2.0000 | INHALATION_SPRAY | RESPIRATORY_TRACT | Status: DC | PRN
  Filled 2013-03-26: qty 6.7

## 2013-03-26 MED ORDER — HYDROCODONE-ACETAMINOPHEN 5-325 MG PO TABS: 1.0000 | ORAL_TABLET | ORAL | Status: DC | PRN

## 2013-03-26 MED ORDER — VERAPAMIL HCL 40 MG PO TABS
40.0000 mg | ORAL_TABLET | Freq: Every day | ORAL | Status: DC
  Administered 2013-03-26 – 2013-03-28 (×3): 40 mg via ORAL
  Filled 2013-03-26 (×4): qty 1

## 2013-03-26 MED ORDER — GABAPENTIN 300 MG PO CAPS
600.0000 mg | ORAL_CAPSULE | Freq: Two times a day (BID) | ORAL | Status: DC
  Administered 2013-03-26 – 2013-03-29 (×6): 600 mg via ORAL
  Filled 2013-03-26 (×7): qty 2

## 2013-03-26 MED ORDER — POTASSIUM CHLORIDE CRYS ER 20 MEQ PO TBCR
40.0000 meq | EXTENDED_RELEASE_TABLET | Freq: Once | ORAL | Status: AC
  Administered 2013-03-26: 40 meq via ORAL
  Filled 2013-03-26: qty 2

## 2013-03-26 MED ORDER — ASPIRIN 81 MG PO CHEW: 81.0000 mg | CHEWABLE_TABLET | Freq: Every day | ORAL | Status: DC

## 2013-03-26 MED ORDER — ACETAMINOPHEN 325 MG PO TABS: 650.0000 mg | ORAL_TABLET | Freq: Four times a day (QID) | ORAL | Status: DC | PRN

## 2013-03-26 MED ORDER — ONDANSETRON HCL 4 MG/2ML IJ SOLN
4.0000 mg | Freq: Four times a day (QID) | INTRAMUSCULAR | Status: DC | PRN
  Administered 2013-03-26 – 2013-03-29 (×3): 4 mg via INTRAVENOUS
  Filled 2013-03-26 (×3): qty 2

## 2013-03-26 MED ORDER — POTASSIUM CHLORIDE 10 MEQ/100ML IV SOLN
10.0000 meq | Freq: Once | INTRAVENOUS | Status: AC
  Administered 2013-03-26: 10 meq via INTRAVENOUS
  Filled 2013-03-26: qty 100

## 2013-03-26 MED ORDER — SODIUM CHLORIDE 0.9 % IV BOLUS (SEPSIS)
1000.0000 mL | Freq: Once | INTRAVENOUS | Status: AC
  Administered 2013-03-26: 1000 mL via INTRAVENOUS

## 2013-03-26 MED ORDER — MAGNESIUM SULFATE 40 MG/ML IJ SOLN
2.0000 g | Freq: Once | INTRAMUSCULAR | Status: AC
  Administered 2013-03-26: 2 g via INTRAVENOUS
  Filled 2013-03-26: qty 50

## 2013-03-26 MED ORDER — ASPIRIN 300 MG RE SUPP
300.0000 mg | Freq: Every day | RECTAL | Status: DC
  Filled 2013-03-26: qty 1

## 2013-03-26 MED ORDER — ALUM & MAG HYDROXIDE-SIMETH 200-200-20 MG/5ML PO SUSP: 15.0000 mL | Freq: Four times a day (QID) | ORAL | Status: DC | PRN

## 2013-03-26 MED ORDER — DIAZEPAM 5 MG/ML IJ SOLN
5.0000 mg | Freq: Once | INTRAMUSCULAR | Status: AC
  Administered 2013-03-26: 5 mg via INTRAVENOUS
  Filled 2013-03-26: qty 2

## 2013-03-26 MED ORDER — FOLIC ACID 1 MG PO TABS
1.0000 mg | ORAL_TABLET | Freq: Every day | ORAL | Status: DC
  Administered 2013-03-26 – 2013-03-29 (×4): 1 mg via ORAL
  Filled 2013-03-26 (×4): qty 1

## 2013-03-26 MED ORDER — TRAMADOL HCL 50 MG PO TABS
50.0000 mg | ORAL_TABLET | Freq: Two times a day (BID) | ORAL | Status: DC | PRN
  Administered 2013-03-27 – 2013-03-29 (×4): 100 mg via ORAL
  Filled 2013-03-26 (×4): qty 2

## 2013-03-26 MED ORDER — ASPIRIN EC 81 MG PO TBEC
81.0000 mg | DELAYED_RELEASE_TABLET | Freq: Every day | ORAL | Status: DC
  Filled 2013-03-26: qty 1

## 2013-03-26 MED ORDER — ACETAMINOPHEN 325 MG PO TABS
650.0000 mg | ORAL_TABLET | ORAL | Status: DC | PRN
  Filled 2013-03-26: qty 2

## 2013-03-26 MED ORDER — PANTOPRAZOLE SODIUM 40 MG PO TBEC
40.0000 mg | DELAYED_RELEASE_TABLET | Freq: Two times a day (BID) | ORAL | Status: DC
  Administered 2013-03-26 – 2013-03-29 (×6): 40 mg via ORAL
  Filled 2013-03-26 (×5): qty 1

## 2013-03-26 MED ORDER — ACETAMINOPHEN 650 MG RE SUPP: 650.0000 mg | RECTAL | Status: DC | PRN

## 2013-03-26 MED ORDER — MECLIZINE HCL 25 MG PO TABS
25.0000 mg | ORAL_TABLET | Freq: Once | ORAL | Status: AC
  Administered 2013-03-26: 25 mg via ORAL
  Filled 2013-03-26: qty 1

## 2013-03-26 MED ORDER — ENOXAPARIN SODIUM 40 MG/0.4ML ~~LOC~~ SOLN
40.0000 mg | SUBCUTANEOUS | Status: DC
  Administered 2013-03-27 – 2013-03-29 (×3): 40 mg via SUBCUTANEOUS
  Filled 2013-03-26 (×3): qty 0.4

## 2013-03-26 MED ORDER — CLOBETASOL PROPIONATE 0.05 % EX OINT
1.0000 "application " | TOPICAL_OINTMENT | Freq: Two times a day (BID) | CUTANEOUS | Status: DC
  Administered 2013-03-26 – 2013-03-28 (×5): 1 via TOPICAL
  Filled 2013-03-26: qty 15

## 2013-03-26 MED ORDER — SODIUM CHLORIDE 0.9 % IV SOLN
INTRAVENOUS | Status: DC
  Administered 2013-03-26 – 2013-03-28 (×3): via INTRAVENOUS

## 2013-03-26 MED ORDER — ADULT MULTIVITAMIN W/MINERALS CH
1.0000 | ORAL_TABLET | Freq: Every day | ORAL | Status: DC
  Administered 2013-03-27 – 2013-03-29 (×3): 1 via ORAL
  Filled 2013-03-26 (×3): qty 1

## 2013-03-26 MED ORDER — ALPRAZOLAM 0.5 MG PO TABS
0.5000 mg | ORAL_TABLET | Freq: Two times a day (BID) | ORAL | Status: DC
  Administered 2013-03-26 – 2013-03-29 (×6): 0.5 mg via ORAL
  Filled 2013-03-26 (×6): qty 1

## 2013-03-26 MED ORDER — ONDANSETRON HCL 4 MG/2ML IJ SOLN
4.0000 mg | INTRAMUSCULAR | Status: AC
  Administered 2013-03-26: 4 mg via INTRAVENOUS
  Filled 2013-03-26: qty 2

## 2013-03-26 MED ORDER — POTASSIUM CHLORIDE CRYS ER 20 MEQ PO TBCR
40.0000 meq | EXTENDED_RELEASE_TABLET | ORAL | Status: AC
  Administered 2013-03-26 (×2): 40 meq via ORAL
  Filled 2013-03-26 (×2): qty 2

## 2013-03-26 MED ORDER — ASPIRIN 325 MG PO TABS: 324.0000 mg | ORAL_TABLET | Freq: Once | ORAL | Status: DC

## 2013-03-26 MED ORDER — MULTIVITAMINS PO CAPS: 1.0000 | ORAL_CAPSULE | Freq: Every day | ORAL | Status: DC

## 2013-03-26 MED ORDER — MORPHINE SULFATE 2 MG/ML IJ SOLN
2.0000 mg | INTRAMUSCULAR | Status: DC | PRN
  Administered 2013-03-26 (×2): 2 mg via INTRAVENOUS
  Filled 2013-03-26 (×2): qty 1

## 2013-03-26 MED ORDER — ASPIRIN 325 MG PO TABS
325.0000 mg | ORAL_TABLET | Freq: Every day | ORAL | Status: DC
  Administered 2013-03-26: 325 mg via ORAL
  Filled 2013-03-26: qty 1

## 2013-03-26 MED ORDER — ASPIRIN 81 MG PO CHEW
81.0000 mg | CHEWABLE_TABLET | Freq: Every day | ORAL | Status: DC
  Administered 2013-03-27 – 2013-03-29 (×3): 81 mg via ORAL
  Filled 2013-03-26 (×3): qty 1

## 2013-03-26 NOTE — ED Notes (Signed)
Report given to Audery Amel on 3300, transported via Doctor, general practice by Lenise Arena and Malachi Bonds NT

## 2013-03-26 NOTE — Progress Notes (Signed)
Patient received to room 3314 from ED. Patient is experiencing a severe headache and also is experiencing heartburn.  Husband is at bedside. Vitals obtained and patient and husband instructed not to let her get out of bed due to her falling frequently this past week. Bed alarm is on. MD notified of patient being on the floor.

## 2013-03-26 NOTE — Consult Note (Addendum)
Referring Physician: Dr. Donna Bernard    Chief Complaint: Dizziness and unstable gait as well as feeling generalized weakness.  HPI: [name] is an 52 y.o. female a history of irritable bowel syndrome, peripheral vascular disease, intracranial vascular malformations, rheumatoid arthritis and sleep apnea presenting with complaint of unstable gait and dizziness for starting on 03/25/2013, in addition to feeling generally weak. CT scan of her head showed equivocal right frontal abnormal low density area suggestive of stroke. MRI, however, showed findings consistent with right parietal acute ischemic infarction. MRA showed no large vessel occlusion or significant stenosis. Both anterior cerebral arteries arose from the right ICA. Patient said no changes in speech and no left-sided weakness.  It's unclear when she was last known normal. There is no previous history of stroke nor TIA. She's been taking Plavix daily.  LSN: 03/25/2013 tPA Given: No: Beyond time under for treatment consideration MRankin: 1  Past Medical History  Diagnosis Date  . IBS (irritable bowel syndrome)     mixed  . GERD (gastroesophageal reflux disease)   . Allergic rhinitis   . Asthma   . Eczema   . Sleep apnea     cpap setting of 16, humidity setting 68f 4% sleep study 07-10-2010 epic  . Rheumatoid arthritis(714.0) 2010    oa and ra  . Nephrolithiasis     current kidney stones  . Plaque psoriasis   . Cerebral vascular malformation     sees dr Lovell Sheehan for monitoring as needed, sees dr lewitt for headaches every 4 months  . Lactose intolerance   . PONV (postoperative nausea and vomiting)     also itching    Family History  Problem Relation Age of Onset  . Diabetes Maternal Grandmother   . Diabetes Brother   . Diabetes Paternal Grandmother   . Diabetes Maternal Grandfather   . Diabetes Paternal Grandfather   . Heart disease Brother   . Heart disease Other     Uncle  . Arthritis Maternal Grandmother   . Breast  cancer Maternal Aunt   . Ovarian cancer Mother      Medications:  I have reviewed the patient's current medications. Prior to Admission:  Prescriptions prior to admission  Medication Sig Dispense Refill  . ALPRAZolam (XANAX) 0.5 MG tablet Take 0.5 mg by mouth 2 (two) times daily.       . clobetasol ointment (TEMOVATE) 0.05 % Apply 1 application topically 2 (two) times daily.       . folic acid (FOLVITE) 1 MG tablet Take 1 mg by mouth daily.      Marland Kitchen gabapentin (NEURONTIN) 300 MG capsule Take 600 mg by mouth 2 (two) times daily.       Marland Kitchen HYDROcodone-acetaminophen (VICODIN) 5-500 MG per tablet Take 1 tablet by mouth every 6 (six) hours as needed for pain.       . InFLIXimab (REMICADE IV) Inject 3 mg/kg into the vein every 8 (eight) weeks.      . Multiple Vitamin (MULTIVITAMIN) capsule Take 1 capsule by mouth daily.       . NON FORMULARY Place 1 spray into the nose 2 (two) times daily as needed. Lidocaine Spray.  Use as directed for headaches.      . pantoprazole (PROTONIX) 40 MG tablet Take 1 tablet (40 mg total) by mouth daily.  30 tablet  3  . PRESCRIPTION MEDICATION Inject 10 mg into the muscle every Friday. Methotrexate 25 mg/ml.  10 mg = 0.4 ml      .  promethazine (PHENERGAN) 25 MG tablet Take 25 mg by mouth every 6 (six) hours as needed. For nausea      . rOPINIRole (REQUIP) 0.5 MG tablet Take 2 tablets (1 mg total) by mouth daily after supper.  60 tablet  6  . traMADol (ULTRAM) 50 MG tablet Take 50-100 mg by mouth 2 (two) times daily as needed. For pain      . verapamil (CALAN) 80 MG tablet Take 40 mg by mouth at bedtime. Takes 1/2 of 80 mg tab at hs      . VENTOLIN HFA 108 (90 BASE) MCG/ACT inhaler Inhale 2 puffs into the lungs every 6 (six) hours as needed.        ROS: History obtained from spouse and patient  General ROS: negative for - chills, fatigue, fever, night sweats, weight gain or weight loss Psychological ROS: Negative Ophthalmic ROS: negative for - blurry vision, double  vision, eye pain or loss of vision ENT ROS: negative for - epistaxis, nasal discharge, oral lesions, sore throat, tinnitus or vertigo Allergy and Immunology ROS: negative for - hives or itchy/watery eyes Hematological and Lymphatic ROS: negative for - bleeding problems, bruising or swollen lymph nodes Endocrine ROS: negative for - galactorrhea, hair pattern changes, polydipsia/polyuria or temperature intolerance Respiratory ROS: negative for - cough, hemoptysis, shortness of breath or wheezing Cardiovascular ROS: negative for - chest pain, dyspnea on exertion, edema or irregular heartbeat Gastrointestinal ROS: negative for - abdominal pain, diarrhea, hematemesis, nausea/vomiting or stool incontinence Genito-Urinary ROS: negative for - dysuria, hematuria, incontinence or urinary frequency/urgency Musculoskeletal ROS: Negative Neurological ROS: as noted in HPI Dermatological ROS: negative for rash and skin lesion changes   Physical Examination: Blood pressure 111/71, pulse 86, temperature 100 F (37.8 C), temperature source Oral, resp. rate 16, SpO2 97.00%.  Neurologic Examination: Mental Status: Alert, oriented x3 and complaining of severe headache  Speech fluent without evidence of aphasia. Able to follow commands without difficulty. Cranial Nerves: II-equivocal left visual field deficit. III/IV/VI-Pupils were equal and reacted. Extraocular movements were full and conjugate.    V/VII-no facial numbness and no facial weakness. VIII-normal. X-normal speech and symmetrical palatal movement. Motor: 5/5 bilaterally with normal tone and bulk Sensory: Normal throughout. Deep Tendon Reflexes: 2+ and symmetric. Plantars: Flexor bilaterally Cerebellar: Normal finger-to-nose testing.  Ct Head Wo Contrast  03/26/2013   *RADIOLOGY REPORT*  Clinical Data: Dizziness.  Headache and photophobia.  CT HEAD WITHOUT CONTRAST  Technique:  Contiguous axial images were obtained from the base of the skull  through the vertex without contrast.  Comparison: MRI brain 08/08/2012.  Findings: A focal subcortical density in the anterior right frontal lobe is compatible with the patient's known cavernous hemangioma. Cortical hypoattenuation in the posterior right frontal lobe is new.  No other acute infarct, hemorrhage, or mass lesion is present.  The ventricles are of normal size.  No significant extra- axial fluid collection is present.  Fluid levels are present within the maxillary sinuses bilaterally, right greater than left.  Scattered ethmoid sinus disease is worse on the left.  Small fluid level is noted along the inferior left frontal sinus.  The mastoid air cells are clear.  The osseous skull is intact.  IMPRESSION:  1.  New cortical hypoattenuation in the posterior right frontal lobe is concerning for acute / subacute infarct.  Please correlate with left-sided weakness or parasthesias. 2.  Remote anterior frontal lobe cavernous hemangioma. 3.  Bilateral maxillary and left greater than right ethmoid and frontal sinus disease.  Original Report Authenticated By: Marin Roberts, M.D.    Assessment: 52 y.o. female presenting with acute right parietal ischemic infarction.  Stroke Risk Factors - family history  Plan: 1. HgbA1c, fasting lipid panel 2. PT consult, OT consult, Speech consult 2. Echocardiogram 3. Carotid dopplers 4. Prophylactic therapy-Antiplatelet med: Aspirin 81 mg per day 5. Risk factor modification 6. Telemetry monitoring   C.R. Roseanne Reno, MD [facility] [phone] 03/26/2013, 6:50 PM

## 2013-03-26 NOTE — Progress Notes (Signed)
Pt admitted with acute stroke. Neuro has seen and wrote appropriate orders and consult note.  Jimmye Norman, NP [facility]

## 2013-03-26 NOTE — ED Provider Notes (Signed)
History     CSN: [id number]  Arrival date & time 03/26/13  1146   First MD Initiated Contact with Patient 03/26/13 1223      Chief Complaint  Patient presents with  . Dizziness    (Consider location/radiation/quality/duration/timing/severity/associated sxs/prior treatment) HPI Comments: Patient is a 52 y/o female with a hx of CVM presents for headache with onset 6 days ago which has been gradually worsening since onset. Symptoms began 7 days ago as URI symptoms. Patient saw her PCP who treated her with Z-pack. Following day patient developed "projectile" NB/NB emesis x 5 days; again saw PCP who treated patient with phenergan suppository. Phergan PR provided no relief. Patient seen 2 days ago, again by PCP, who tx with phenergan IM. After receiving IM phenergan nausea and emesis subsided, followed in the next 24 hours by increased dizziness characterized by "the room spinning" and worsening of the patient's frontal headache. Headache associated with intermittent SOB and photophobia. Symptoms alleviated by closing eyes to improve photophobia and without aggravating factors. Husband also admits to associated confusion. Patient and spouse deny fevers, hearing changes, CP, diarrhea, melena, hematochezia, urinary symptoms, and extremity weakness.  The history is provided by the patient and the spouse. No language interpreter was used.    Past Medical History  Diagnosis Date  . IBS (irritable bowel syndrome)     mixed  . GERD (gastroesophageal reflux disease)   . Allergic rhinitis   . Asthma   . Eczema   . Sleep apnea     cpap setting of 16, humidity setting 61f 4% sleep study 07-10-2010 epic  . Rheumatoid arthritis(714.0) 2010    oa and ra  . Nephrolithiasis     current kidney stones  . Plaque psoriasis   . Cerebral vascular malformation     sees dr Lovell Sheehan for monitoring as needed, sees dr lewitt for headaches every 4 months  . Lactose intolerance   . PONV (postoperative nausea and  vomiting)     also itching    Past Surgical History  Procedure Laterality Date  . Incontinence surgery  2010    sling done   . Knee arthroscopy  1992    left  . Foot surgery  1999    right ankle  . Kidney stone surgery  2001  . Appendectomy  1981  . Cholecystectomy  1981  . Tubal ligation  1990  . Total abdominal hysterectomy  2001  . Hernia repair  2006  . Left foot plating and scarping for arthritis  2011  . Right ear tube insertion  2011  . Incisional hernia repair  05/20/2012    Procedure: HERNIA REPAIR INCISIONAL;  Surgeon: Clovis Pu. Cornett, MD;  Location: [facility];  Service: General;  Laterality: N/A;    Family History  Problem Relation Age of Onset  . Diabetes Maternal Grandmother   . Diabetes Brother   . Diabetes Paternal Grandmother   . Diabetes Maternal Grandfather   . Diabetes Paternal Grandfather   . Heart disease Brother   . Heart disease Other     Uncle  . Arthritis Maternal Grandmother   . Breast cancer Maternal Aunt   . Ovarian cancer Mother     History  Substance Use Topics  . Smoking status: Former Smoker -- 2.00 packs/day for 30 years    Types: Cigarettes    Quit date: 10/16/2000  . Smokeless tobacco: Never Used  . Alcohol Use: Yes     Comment: rare    OB History  Grav Para Term Preterm Abortions TAB SAB Ect Mult Living                  Review of Systems  Constitutional: Negative for fever.  HENT: Negative for trouble swallowing.   Eyes: Positive for photophobia and visual disturbance ("blurry").  Respiratory: Positive for shortness of breath (mild, intermittent).   Cardiovascular: Negative for chest pain.  Gastrointestinal: Positive for nausea, vomiting (NB/NB) and abdominal pain. Negative for blood in stool.  Neurological: Positive for dizziness and headaches. Negative for syncope and numbness.  Psychiatric/Behavioral: Positive for confusion.  All other systems reviewed and are negative.    Allergies  Amoxicillin; Aspirin;  Ciprofloxacin; Cosyntropin; Guaifenesin; Moxifloxacin; Nsaids; and Sulfonamide derivatives  Home Medications   Current Outpatient Rx  Name  Route  Sig  Dispense  Refill  . ALPRAZolam (XANAX) 0.5 MG tablet   Oral   Take 0.5 mg by mouth 2 (two) times daily.          . clobetasol ointment (TEMOVATE) 0.05 %   Topical   Apply 1 application topically 2 (two) times daily.          . folic acid (FOLVITE) 1 MG tablet   Oral   Take 1 mg by mouth daily.         Marland Kitchen gabapentin (NEURONTIN) 300 MG capsule   Oral   Take 600 mg by mouth 2 (two) times daily.          Marland Kitchen HYDROcodone-acetaminophen (VICODIN) 5-500 MG per tablet   Oral   Take 1 tablet by mouth every 6 (six) hours as needed for pain.          . InFLIXimab (REMICADE IV)   Intravenous   Inject 3 mg/kg into the vein every 8 (eight) weeks.         . Multiple Vitamin (MULTIVITAMIN) capsule   Oral   Take 1 capsule by mouth daily.          . NON FORMULARY   Nasal   Place 1 spray into the nose 2 (two) times daily as needed. Lidocaine Spray.  Use as directed for headaches.         . pantoprazole (PROTONIX) 40 MG tablet   Oral   Take 1 tablet (40 mg total) by mouth daily.   30 tablet   3   . PRESCRIPTION MEDICATION   Intramuscular   Inject 10 mg into the muscle every Friday. Methotrexate 25 mg/ml.  10 mg = 0.4 ml         . promethazine (PHENERGAN) 25 MG tablet   Oral   Take 25 mg by mouth every 6 (six) hours as needed. For nausea         . rOPINIRole (REQUIP) 0.5 MG tablet   Oral   Take 2 tablets (1 mg total) by mouth daily after supper.   60 tablet   6   . traMADol (ULTRAM) 50 MG tablet   Oral   Take 50-100 mg by mouth 2 (two) times daily as needed. For pain         . verapamil (CALAN) 80 MG tablet   Oral   Take 40 mg by mouth at bedtime. Takes 1/2 of 80 mg tab at hs         . VENTOLIN HFA 108 (90 BASE) MCG/ACT inhaler   Inhalation   Inhale 2 puffs into the lungs every 6 (six) hours as  needed.  BP 134/81  Pulse 67  Temp(Src) 99 F (37.2 C) (Oral)  Resp 12  SpO2 100%  Physical Exam  Nursing note and vitals reviewed. Constitutional: She appears well-developed and well-nourished. No distress.  Uncomfortable and ill appearing; nontoxic and in NAD  HENT:  Head: Normocephalic and atraumatic.  Right Ear: Tympanic membrane, external ear and ear canal normal.  Left Ear: Tympanic membrane, external ear and ear canal normal.  Mouth/Throat: Oropharynx is clear and moist. No oropharyngeal exudate.  Symmetric rise of the uvula with phonation  Eyes: Conjunctivae and EOM are normal. Pupils are equal, round, and reactive to light. No scleral icterus.  Neck: Normal range of motion. Neck supple.  No nuchal rigidity or meningeal signs. Negative Brudzinski's sign.  Cardiovascular: Normal rate, regular rhythm, normal heart sounds and intact distal pulses.   Patient with HR in 90's; not tachy as noted in triage  Pulmonary/Chest: Effort normal and breath sounds normal. No respiratory distress. She has no wheezes. She has no rales. She exhibits no tenderness.  Abdominal: Soft. She exhibits no distension and no mass. There is tenderness (mild and diffuse). There is no rebound and no guarding.  No peritoneal signs  Musculoskeletal: Normal range of motion. She exhibits no edema and no tenderness.  Lymphadenopathy:    She has no cervical adenopathy.  Neurological: She is alert. She has normal reflexes. She exhibits normal muscle tone.  A&Ox2-3, oriented to person, place, and month; unsure of year and current president. CN III-XII grossly intact and patient moves extremities without ataxia. Equal b/l grip strength with 5/5 strength against resistance. DTRs normal and symmetric. No sensory or motor deficits appreciated.  Skin: Skin is warm and dry. No rash noted. She is not diaphoretic. No erythema. There is pallor (mild).  Psychiatric: She has a normal mood and affect. Her  behavior is normal.    ED Course  Procedures (including critical care time)  Labs Reviewed  CBC - Abnormal; Notable for the following:    WBC 16.3 (*)    Hemoglobin 16.8 (*)    MCHC 36.5 (*)    All other components within normal limits  BASIC METABOLIC PANEL - Abnormal; Notable for the following:    Potassium 2.5 (*)    Glucose, Bld 123 (*)    All other components within normal limits  POTASSIUM - Abnormal; Notable for the following:    Potassium 2.7 (*)    All other components within normal limits  GLUCOSE, CAPILLARY  URINALYSIS, ROUTINE W REFLEX MICROSCOPIC   Ct Head Wo Contrast  03/26/2013   *RADIOLOGY REPORT*  Clinical Data: Dizziness.  Headache and photophobia.  CT HEAD WITHOUT CONTRAST  Technique:  Contiguous axial images were obtained from the base of the skull through the vertex without contrast.  Comparison: MRI brain 08/08/2012.  Findings: A focal subcortical density in the anterior right frontal lobe is compatible with the patient's known cavernous hemangioma. Cortical hypoattenuation in the posterior right frontal lobe is new.  No other acute infarct, hemorrhage, or mass lesion is present.  The ventricles are of normal size.  No significant extra- axial fluid collection is present.  Fluid levels are present within the maxillary sinuses bilaterally, right greater than left.  Scattered ethmoid sinus disease is worse on the left.  Small fluid level is noted along the inferior left frontal sinus.  The mastoid air cells are clear.  The osseous skull is intact.  IMPRESSION:  1.  New cortical hypoattenuation in the posterior right frontal lobe is  concerning for acute / subacute infarct.  Please correlate with left-sided weakness or parasthesias. 2.  Remote anterior frontal lobe cavernous hemangioma. 3.  Bilateral maxillary and left greater than right ethmoid and frontal sinus disease.   Original Report Authenticated By: Marin Roberts, M.D.     Date: 03/26/2013  Rate: 114   Rhythm: sinus tachycardia and premature ventricular contractions (PVC) (3 PVCs)  QRS Axis: normal  Intervals: QT prolonged; QT 518  ST/T Wave abnormalities: nonspecific ST changes  Conduction Disutrbances:none  Narrative Interpretation: Sinus tachycardia with prolonged QT interval and nonspecific ST changes. No STEMI  Old EKG Reviewed: no QTc prolongation on prior EKG; otherwise unchanged from 04/2012 I personally reviewed and interpreted this EKG   1. Vertigo   2. Hypokalemia     MDM  52 y/o female with history of cerebral vascular malformation presenting with increased dizziness, confusion and worsening frontal headache. Symptoms preceded by URI symptoms x 1 day followed by 5 days of nausea and NB/NB emesis, now resolved. Symptoms for which patient presents with today worsening x 2 days. Patient is A&Ox2 and follows simple commands on exam. No gross focal neurologic deficits appreciated. W/u to include CBG, CBC, BMP, UA, and CT head without contrast. IVF, valium, and zofran ordered for symptoms.  CBG - 98  Patient with hypokalemia of 2.5 with EKG changes; QT prolonged to 518. PO and IV potassium ordered as well as IV magnesium sulfate. Labs otherwise significant also for leukocytosis of 16.3. CT pending and second potassium level ordered.  Hypokalemia confirmed with second level of 2.7. CT scan significant for new cortical hypoattenuation in the posterior right frontal lobe. Findings consistent with acute or subacute infarct. MRI ordered for further evaluation. Have spoken with Dr. Roseanne Reno from neurology who has been made aware of the patient as well as the pending MRI. Consult placed to internal medicine unassigned for admission.  [facility] Team 10 to admit.      Antony Madura, PA-C 03/26/13 1636

## 2013-03-26 NOTE — H&P (Signed)
[facility] History and Physical  [name] ZOX:[id number] DOB: May 20, 1961 DOA: 03/26/2013  Referring physician: Dr Hyacinth Meeker PCP: Lilyan Punt, MD  Specialists:   Chief Complaint: dizziness and persistent headaches  HPI: [name] is a 52 y.o. female with cerebral vascular malformation followed by Dr Lovell Sheehan, IBS and multiple medical problems as listed below who presents with the above complaints. She states that for the past 1week she has had severe headaches and dizziness. She describes the headaches as bifrontal, associated with as well as phonophobia and photophobia. She reports the dizziness- feels like she 'cannot get her bearings' and that she has been very unsteady and hit her on the bathroom door about 4times today. She admits to generalized weakness; denies focal weakness, dysphagia and no slurred speech. Pt also states she has had subjective fevers, cough productive of yellowish sputum and dysuria. She states she had nausea vomiting and diarrhea initially but none in the past couple of days. In the ED she had a CT which showed New cortical hypoattenuation in the posterior right frontal lobe is concerning for acute / subacute infarct, k 2.5 EKG with QT prolongation(QTc 518) sinus tach 114 & PVCs and wbc 16. Neuro was consulted per EDP and she is admitted to hospitalist service for further eval and management.   Review of Systems: The patient denies anorexia, weight loss,, vision loss, decreased hearing, hoarseness, chest pain, syncope, dyspnea on exertion, peripheral edema, hemoptysis, abdominal pain, melena, hematochezia, severe indigestion/heartburn, hematuria, incontinence, suspicious skin lesions, transient blindness, difficulty walking, depression, unusual weight change.  Past Medical History  Diagnosis Date  . IBS (irritable bowel syndrome)     mixed  . GERD (gastroesophageal reflux disease)   . Allergic rhinitis   . Asthma   . Eczema   . Sleep apnea     cpap  setting of 16, humidity setting 70f 4% sleep study 07-10-2010 epic  . Rheumatoid arthritis(714.0) 2010    oa and ra  . Nephrolithiasis     current kidney stones  . Plaque psoriasis   . Cerebral vascular malformation     sees dr Lovell Sheehan for monitoring as needed, sees dr lewitt for headaches every 4 months  . Lactose intolerance   . PONV (postoperative nausea and vomiting)     also itching   Past Surgical History  Procedure Laterality Date  . Incontinence surgery  2010    sling done   . Knee arthroscopy  1992    left  . Foot surgery  1999    right ankle  . Kidney stone surgery  2001  . Appendectomy  1981  . Cholecystectomy  1981  . Tubal ligation  1990  . Total abdominal hysterectomy  2001  . Hernia repair  2006  . Left foot plating and scarping for arthritis  2011  . Right ear tube insertion  2011  . Incisional hernia repair  05/20/2012    Procedure: HERNIA REPAIR INCISIONAL;  Surgeon: Clovis Pu. Cornett, MD;  Location: [facility];  Service: General;  Laterality: N/A;   Social History:  reports that she quit smoking about 12 years ago. Her smoking use included Cigarettes. She has a 60 pack-year smoking history. She has never used smokeless tobacco. She reports that  drinks alcohol. She reports that she does not use illicit drugs.  where does patient live--home   Allergies  Allergen Reactions  . Amoxicillin     REACTION: GI upset  . Aspirin     REACTION: severe gi upset  .  Ciprofloxacin     REACTION: angioedema, urticaria  . Cosyntropin     REACTION: swelling, hives  . Guaifenesin     REACTION: head rash  . Moxifloxacin Other (See Comments)    gi upset  . Nsaids Other (See Comments)    Gi upset  . Sulfonamide Derivatives     REACTION: swelling, tongue and hives    Family History  Problem Relation Age of Onset  . Diabetes Maternal Grandmother   . Diabetes Brother   . Diabetes Paternal Grandmother   . Diabetes Maternal Grandfather   . Diabetes Paternal Grandfather   .  Heart disease Brother   . Heart disease Other     Uncle  . Arthritis Maternal Grandmother   . Breast cancer Maternal Aunt   . Ovarian cancer Mother     Prior to Admission medications   Medication Sig Start Date End Date Taking? Authorizing Provider  ALPRAZolam Prudy Feeler) 0.5 MG tablet Take 0.5 mg by mouth 2 (two) times daily.    Yes Historical Provider, MD  clobetasol ointment (TEMOVATE) 0.05 % Apply 1 application topically 2 (two) times daily.  04/25/12  Yes Historical Provider, MD  folic acid (FOLVITE) 1 MG tablet Take 1 mg by mouth daily.   Yes Historical Provider, MD  gabapentin (NEURONTIN) 300 MG capsule Take 600 mg by mouth 2 (two) times daily.    Yes Historical Provider, MD  HYDROcodone-acetaminophen (VICODIN) 5-500 MG per tablet Take 1 tablet by mouth every 6 (six) hours as needed for pain.    Yes Historical Provider, MD  InFLIXimab (REMICADE IV) Inject 3 mg/kg into the vein every 8 (eight) weeks.   Yes Historical Provider, MD  Multiple Vitamin (MULTIVITAMIN) capsule Take 1 capsule by mouth daily.    Yes Historical Provider, MD  NON FORMULARY Place 1 spray into the nose 2 (two) times daily as needed. Lidocaine Spray.  Use as directed for headaches.   Yes Historical Provider, MD  pantoprazole (PROTONIX) 40 MG tablet Take 1 tablet (40 mg total) by mouth daily. 11/25/12 11/25/13 Yes Nira Retort, NP  PRESCRIPTION MEDICATION Inject 10 mg into the muscle every Friday. Methotrexate 25 mg/ml.  10 mg = 0.4 ml   Yes Historical Provider, MD  promethazine (PHENERGAN) 25 MG tablet Take 25 mg by mouth every 6 (six) hours as needed. For nausea   Yes Historical Provider, MD  rOPINIRole (REQUIP) 0.5 MG tablet Take 2 tablets (1 mg total) by mouth daily after supper. 09/09/12 09/09/13 Yes Barbaraann Share, MD  traMADol (ULTRAM) 50 MG tablet Take 50-100 mg by mouth 2 (two) times daily as needed. For pain   Yes Historical Provider, MD  verapamil (CALAN) 80 MG tablet Take 40 mg by mouth at bedtime. Takes 1/2 of 80  mg tab at hs   Yes Historical Provider, MD  VENTOLIN HFA 108 (90 BASE) MCG/ACT inhaler Inhale 2 puffs into the lungs every 6 (six) hours as needed.  04/25/12   Historical Provider, MD   Physical Exam: Filed Vitals:   03/26/13 1445 03/26/13 1500 03/26/13 1615 03/26/13 1630  BP: 132/91 102/88 134/81 133/70  Pulse: 82 94 67 75  Temp:      TempSrc:      Resp: 13 19 12 15   SpO2: 100% 100% 100% 98%    Constitutional: Vital signs reviewed.  Patient is a well-developed and well-nourished  in no acute distress and cooperative with exam. Alert and oriented x3.  Head: Normocephalic and atraumatic Nose: No erythema or  drainage noted.  Mouth: no erythema or exudates, MMM Eyes: PERRL, EOMI, conjunctivae normal, No scleral icterus.  Neck: Supple, Trachea midline normal ROM, No JVD, mass, thyromegaly, or carotid bruit present.  Cardiovascular: RRR, S1 normal, S2 normal, no MRG, pulses symmetric and intact bilaterally Pulmonary/Chest: normal respiratory effort, decreased BS at the bases Abdominal: Soft. Non-tender, non-distended, bowel sounds are normal, no masses, organomegaly, or guarding present.  GU: no CVA tenderness Extremities: no cyanosis and no edema Hematology: no cervical, inginal, or axillary adenopathy.  Neurological: A&O x3, Strength is normal and symmetric bilaterally, cranial nerve II-XII are grossly intact, no focal motor deficit, sensory grossly intact.  Skin: Warm, dry and intact. No rash.  Psychiatric: Normal mood and affect. speech and behavior is normal.    Labs on Admission:  Basic Metabolic Panel:  Recent Labs Lab 03/26/13 1249 03/26/13 1407  NA 138  --   K 2.5* 2.7*  CL 100  --   CO2 20  --   GLUCOSE 123*  --   BUN 14  --   CREATININE 0.60  --   CALCIUM 9.7  --    Liver Function Tests: No results found for this basename: AST, ALT, ALKPHOS, BILITOT, PROT, ALBUMIN,  in the last 168 hours No results found for this basename: LIPASE, AMYLASE,  in the last 168  hours No results found for this basename: AMMONIA,  in the last 168 hours CBC:  Recent Labs Lab 03/26/13 1249  WBC 16.3*  HGB 16.8*  HCT 46.0  MCV 90.9  PLT 305   Cardiac Enzymes: No results found for this basename: CKTOTAL, CKMB, CKMBINDEX, TROPONINI,  in the last 168 hours  BNP (last 3 results) No results found for this basename: PROBNP,  in the last 8760 hours CBG:  Recent Labs Lab 03/26/13 1342  GLUCAP 98    Radiological Exams on Admission: Ct Head Wo Contrast  03/26/2013   *RADIOLOGY REPORT*  Clinical Data: Dizziness.  Headache and photophobia.  CT HEAD WITHOUT CONTRAST  Technique:  Contiguous axial images were obtained from the base of the skull through the vertex without contrast.  Comparison: MRI brain 08/08/2012.  Findings: A focal subcortical density in the anterior right frontal lobe is compatible with the patient's known cavernous hemangioma. Cortical hypoattenuation in the posterior right frontal lobe is new.  No other acute infarct, hemorrhage, or mass lesion is present.  The ventricles are of normal size.  No significant extra- axial fluid collection is present.  Fluid levels are present within the maxillary sinuses bilaterally, right greater than left.  Scattered ethmoid sinus disease is worse on the left.  Small fluid level is noted along the inferior left frontal sinus.  The mastoid air cells are clear.  The osseous skull is intact.  IMPRESSION:  1.  New cortical hypoattenuation in the posterior right frontal lobe is concerning for acute / subacute infarct.  Please correlate with left-sided weakness or parasthesias. 2.  Remote anterior frontal lobe cavernous hemangioma. 3.  Bilateral maxillary and left greater than right ethmoid and frontal sinus disease.   Original Report Authenticated By: Marin Roberts, M.D.      Assessment/Plan Active Problems: CVA, Acute vs subacute -As discussed above, will admit for CVA work up- MRI/MRA, echo, carotid dopplers,  A1C,FLP as per protocol. -place on ASA 81mg  -follow and further recs pending studies -Neuro consulted and Dr Roseanne Reno to see pt. Hypokalemia, severe -replace k,likely secondary to GI losses QT prolongation -likely secondary to hypokalemia -replace k -avoid QT  prolonging medss -monitor closely in step down -follow recheck EKG in am Leukocytosis -obtain CXR, UA follow and treat accordingly -she is afebrile and hemodynamically stable at this time Diarrhea -now resolved, follow and obtain stool studies if recurs -she has IBS which could be a possible cause IBS Arthritis -hold remicade and methotrexatea to this time GERD -continue PPI    Code Status: full  Family Communication: none at bedside Disposition Plan: admit to step down  Time spent: >51mins  Kela Millin [facility] Pager [phone]  If 7PM-7AM, please contact night-coverage [email or web address] Password [facility] 03/26/2013, 5:35 PM

## 2013-03-26 NOTE — ED Provider Notes (Signed)
The patient is a 52 year old female who presents with a complaint of dizziness nausea and vomiting. She has had approximately a one-week illness which is progressively getting worse, noticed that she had dizziness in the last 48 hours which has been relatively persistent, and associated with inability to walk well including to walk straight. She denies any blurred vision but feels like the room is constantly spitting. On my exam the patient is persistently vertiginous despite the position that she is in, tympanic membranes are clear bilaterally with a tympanostomy tube on the right. Abdomen is soft, lungs and heart are clear, all 4 extremities appear to have normal strength. Her speech is clear, memory is intact however she does have occasional problems with disorientation.    Laboratory workup shows that the patient is significantly hypokalemic and has a prolonged QT on her EKG. This will require intravenous potassium replacement as well as IV fluid replacement.  The patient will need to be admitted to the hospital and evaluated by both neurology as well as internal medicine, internist will admit to the hospital.   Medical screening examination/treatment/procedure(s) were conducted as a shared visit with non-physician practitioner(s) and myself.  I personally evaluated the patient during the encounter    Vida Roller, MD 03/26/13 1644

## 2013-03-26 NOTE — ED Notes (Signed)
Patient transported to CT 

## 2013-03-26 NOTE — ED Notes (Signed)
Pt has had recent treatment of sickness and did have five days of diarrhea and vomiting.  Family reports uncoordinated with walking and pt has had dizziness.  Pt has hx of cerebral aneurysm and now is having frontal headache and nauseated.

## 2013-03-27 DIAGNOSIS — J209 Acute bronchitis, unspecified: Secondary | ICD-10-CM | POA: Diagnosis present

## 2013-03-27 DIAGNOSIS — I517 Cardiomegaly: Secondary | ICD-10-CM

## 2013-03-27 DIAGNOSIS — R112 Nausea with vomiting, unspecified: Secondary | ICD-10-CM

## 2013-03-27 LAB — URINALYSIS, ROUTINE W REFLEX MICROSCOPIC
Glucose, UA: NEGATIVE mg/dL
Ketones, ur: 15 mg/dL — AB
pH: 6 (ref 5.0–8.0)

## 2013-03-27 LAB — BASIC METABOLIC PANEL
CO2: 26 mEq/L (ref 19–32)
Calcium: 7.8 mg/dL — ABNORMAL LOW (ref 8.4–10.5)
Chloride: 106 mEq/L (ref 96–112)
Creatinine, Ser: 0.66 mg/dL (ref 0.50–1.10)
GFR calc Af Amer: >90 mL/min (ref >90)
GFR calc non Af Amer: >90 mL/min (ref >90)
Potassium: 3.9 mEq/L (ref 3.5–5.1)
Sodium: 138 mEq/L (ref 135–145)

## 2013-03-27 LAB — CBC
MCH: 32.7 pg (ref 26.0–34.0)
MCHC: 34.9 g/dL (ref 30.0–36.0)
MCV: 93.9 fL (ref 78.0–100.0)
Platelets: 242 10*3/uL (ref 150–400)
RDW: 13.6 % (ref 11.5–15.5)
WBC: 8.4 10*3/uL (ref 4.0–10.5)

## 2013-03-27 LAB — MRSA PCR SCREENING: MRSA by PCR: NEGATIVE

## 2013-03-27 LAB — HEMOGLOBIN A1C: Hgb A1c MFr Bld: 5.6 % (ref <5.7)

## 2013-03-27 LAB — LIPID PANEL: Cholesterol: 139 mg/dL (ref 0–200)

## 2013-03-27 MED ORDER — AZITHROMYCIN 500 MG IV SOLR
500.0000 mg | INTRAVENOUS | Status: DC
  Administered 2013-03-27 – 2013-03-28 (×2): 500 mg via INTRAVENOUS
  Filled 2013-03-27 (×2): qty 500

## 2013-03-27 NOTE — Evaluation (Signed)
Occupational Therapy Evaluation Patient Details Name: [name] MRN: [id number] DOB: 08-17-1961 Today's Date: 03/27/2013 Time: 1478-2956 OT Time Calculation (min): 19 min  OT Assessment / Plan / Recommendation Clinical Impression  Pt admitted with dizziness, n/v, weakness, falls and HA.  Found to have R parietal CVA.  PMH includes several forms of arthritis and PVD.  Pt presents with decreased balance and endurance.  Her L UE is slightly weaker than the R, specifically in the shoulder and elbow, maybe be baseline per pt report.  Dizziness has resolved, but HA continues.  Will follow acutely.      OT Assessment  Patient needs continued OT Services    Follow Up Recommendations  Home health OT;Supervision/Assistance - 24 hour    Barriers to Discharge      Equipment Recommendations  3 in 1 bedside comode    Recommendations for Other Services    Frequency  Min 2X/week    Precautions / Restrictions Precautions Precautions: Fall Restrictions Weight Bearing Restrictions: No   Pertinent Vitals/Pain 4/10 HA, repositioned, premedicated    ADL  Eating/Feeding: Independent Where Assessed - Eating/Feeding: Chair Grooming: Wash/dry hands;Wash/dry face;Brushing hair;Min guard Where Assessed - Grooming: Unsupported sitting Upper Body Bathing: Min guard Where Assessed - Upper Body Bathing: Unsupported sitting Lower Body Bathing: Minimal assistance Where Assessed - Lower Body Bathing: Unsupported sitting;Supported sit to stand Upper Body Dressing: Min guard Where Assessed - Upper Body Dressing: Unsupported sitting Lower Body Dressing: Minimal assistance Where Assessed - Lower Body Dressing: Unsupported sitting;Supported sit to stand Equipment Used: Gait belt;Rolling walker Transfers/Ambulation Related to ADLs: min assist with RW, chair following     OT Diagnosis: Generalized weakness;Acute pain  OT Problem List: Decreased strength;Decreased activity tolerance;Impaired balance  (sitting and/or standing);Decreased knowledge of use of DME or AE OT Treatment Interventions: Self-care/ADL training;DME and/or AE instruction;Therapeutic activities;Patient/family education;Balance training   OT Goals Acute Rehab OT Goals OT Goal Formulation: With patient Time For Goal Achievement: 04/10/13 Potential to Achieve Goals: Good ADL Goals Pt Will Perform Grooming: with supervision;Standing at sink ADL Goal: Grooming - Progress: Goal set today Pt Will Perform Upper Body Bathing: with set-up;Sitting at sink ADL Goal: Upper Body Bathing - Progress: Goal set today Pt Will Perform Lower Body Bathing: with supervision;Standing at sink;Sitting at sink ADL Goal: Lower Body Bathing - Progress: Goal set today Pt Will Perform Upper Body Dressing: with set-up;Sitting, chair ADL Goal: Upper Body Dressing - Progress: Goal set today Pt Will Perform Lower Body Dressing: with supervision;Sit to stand from chair ADL Goal: Lower Body Dressing - Progress: Goal set today Pt Will Transfer to Toilet: with supervision;Ambulation;with DME ADL Goal: Toilet Transfer - Progress: Goal set today Pt Will Perform Toileting - Clothing Manipulation: with supervision;Standing ADL Goal: Toileting - Clothing Manipulation - Progress: Goal set today Pt Will Perform Toileting - Hygiene: with supervision;Sit to stand from 3-in-1/toilet ADL Goal: Toileting - Hygiene - Progress: Goal set today Pt Will Perform Tub/Shower Transfer: Shower transfer;Ambulation;with supervision;with DME ADL Goal: Tub/Shower Transfer - Progress: Goal set today  Visit Information  Last OT Received On: 03/27/13 Assistance Needed: +2 (for chair/lines) PT/OT Co-Evaluation/Treatment: Yes    Subjective Data  Subjective: "I am so tired."   Prior Functioning     Home Living Lives With: Spouse Available Help at Discharge: Family;Available 24 hours/day Type of Home: House Home Access: Stairs to enter [employer] of Steps:  2 Entrance Stairs-Rails: None Home Layout: One level Bathroom Shower/Tub: Walk-in shower;Door [employer] Toilet: Standard Home Adaptive Equipment: Straight  cane Additional Comments: plans to build handrail, has access to a RW and maybe 3 in1 Prior Function Level of Independence: Independent with assistive device(s) Able to Take Stairs?: Yes Driving: Yes Vocation: Full time employment Comments: occasionally used cane when RA acted up; out x 2 weeks due to illness 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 Dominant Hand: Right         Vision/Perception Vision - History Baseline Vision: Wears glasses all the time Patient Visual Report: Blurring of vision (does not have dry eye meds here)   Cognition  Cognition Arousal/Alertness: Awake/alert Behavior During Therapy: WFL for tasks assessed/performed Overall Cognitive Status: Within Functional Limits for tasks assessed    Extremity/Trunk Assessment Right Upper Extremity Assessment RUE ROM/Strength/Tone: Deficits RUE ROM/Strength/Tone Deficits: 4/5 wrist and gross grasp, pt has arthritis RUE Coordination: WFL - gross/fine motor Left Upper Extremity Assessment LUE ROM/Strength/Tone: Deficits LUE ROM/Strength/Tone Deficits: shoulder, elbow, wrist, gross grasp 4/5, pt has arthritis LUE Coordination: WFL - gross/fine motor Trunk Assessment Trunk Assessment: Normal     Mobility Bed Mobility Bed Mobility: Supine to Sit Supine to Sit: 5: Supervision Transfers Transfers: Sit to Stand;Stand to Sit Sit to Stand: 4: Min assist;With upper extremity assist;From bed Stand to Sit: 4: Min assist;With upper extremity assist;To chair/3-in-1 Details for Transfer Assistance: verbal cues for hand placement     Exercise     Balance Balance Balance Assessed: Yes Static Sitting Balance Static Sitting - Balance Support: Feet supported Static Sitting - Level of Assistance: 5: Stand by assistance (pt known to  fall out of sitting prior to admission) Static Sitting - Comment/# of Minutes: 3   End of Session OT - End of Session Activity Tolerance: Patient limited by fatigue Patient left: in chair;with call bell/phone within reach;with family/visitor present Nurse Communication: Mobility status  GO     Evern Bio 03/27/2013, 11:10 AM [phone]

## 2013-03-27 NOTE — Evaluation (Signed)
Speech Language Pathology Evaluation Patient Details Name: [name] MRN: [id number] DOB: 1961/07/15 Today's Date: 03/27/2013 Time: 4403-4742 SLP Time Calculation (min): 27 min  Problem List:  Patient Active Problem List   Diagnosis Date Noted  . Persistent headaches 03/26/2013  . CVA (cerebral infarction) 03/26/2013  . Hypokalemia 03/26/2013  . Leukocytosis 03/26/2013  . Arthritis 03/26/2013  . QT prolongation 03/26/2013  . Recurrent ventral incisional hernia 04/19/2012  . OBSTRUCTIVE SLEEP APNEA 08/22/2010  . RESTLESS LEGS SYNDROME 08/22/2010  . ASTHMA 08/22/2010  . ECZEMA 08/22/2010  . Irritable bowel syndrome 06/23/2010  . DIARRHEA 06/23/2010  . WEIGHT LOSS 11/01/2009  . NAUSEA WITH VOMITING 11/01/2009  . ABDOMINAL PAIN, LOWER 11/01/2009  . KNEE, ARTHRITIS, DEGEN./OSTEO 11/18/2008  . DERANGEMENT MENISCUS 11/02/2008  . JOINT EFFUSION, KNEE 11/02/2008  . KNEE PAIN 11/02/2008   Past Medical History:  Past Medical History  Diagnosis Date  . IBS (irritable bowel syndrome)     mixed  . GERD (gastroesophageal reflux disease)   . Allergic rhinitis   . Asthma   . Eczema   . Sleep apnea     cpap setting of 16, humidity setting 50f 4% sleep study 07-10-2010 epic  . Rheumatoid arthritis(714.0) 2010    oa and ra  . Nephrolithiasis     current kidney stones  . Plaque psoriasis   . Cerebral vascular malformation     sees dr Lovell Sheehan for monitoring as needed, sees dr lewitt for headaches every 4 months  . Lactose intolerance   . PONV (postoperative nausea and vomiting)     also itching   Past Surgical History:  Past Surgical History  Procedure Laterality Date  . Incontinence surgery  2010    sling done   . Knee arthroscopy  1992    left  . Foot surgery  1999    right ankle  . Kidney stone surgery  2001  . Appendectomy  1981  . Cholecystectomy  1981  . Tubal ligation  1990  . Total abdominal hysterectomy  2001  . Hernia repair  2006  . Left foot plating and  scarping for arthritis  2011  . Right ear tube insertion  2011  . Incisional hernia repair  05/20/2012    Procedure: HERNIA REPAIR INCISIONAL;  Surgeon: Clovis Pu. Cornett, MD;  Location: [facility];  Service: General;  Laterality: N/A;   HPI:  [name] is an 52 y.o. female a history of irritable bowel syndrome, peripheral vascular disease, intracranial vascular malformations, rheumatoid arthritis and sleep apnea presenting with complaint of unstable gait and dizziness for starting on 03/25/2013, in addition to feeling generally weak. CT scan of her head showed equivocal right frontal abnormal low density area suggestive of stroke. MRI, however, showed findings consistent with right parietal acute ischemic infarction. MRA showed no large vessel occlusion or significant stenosis. Both anterior cerebral arteries arose from the right ICA. Patient said no changes in speech and no left-sided weakness.  It's unclear when she was last known normal.   Assessment / Plan / Recommendation Clinical Impression  Pt demonstrated high level cognitive impairment in areas of multistep processing, reasoning, organizing and sequencing with complex functional tasks. Pt became distressed and anxious as she struggled with tasks, further impairing funciton. This performance is different than her baseline in which she is independent. Recommend pt continue with SLP treatment in acute care for compensatory strategies and education. Would benefit from f/u at next level of care as well for further independence with ADLs.  SLP Assessment  Patient needs continued Speech Lanaguage Pathology Services    Follow Up Recommendations  Outpatient SLP (PT/OT rec HH. Prefer outpatient SLP)    Frequency and Duration min 2x/week  2 weeks   Pertinent Vitals/Pain NA   SLP Goals  SLP Goals SLP Goal #1: Pt will utilize compensatory strategies with complex problem solving tasks with moderate assist from SLP.  SLP Goal #1 - Progress:  Progressing toward goal  SLP Evaluation Prior Functioning  Cognitive/Linguistic Baseline: Within functional limits Type of Home: House Lives With: Spouse Available Help at Discharge: Family;Available 24 hours/day Vocation: Full time employment   Cognition  Overall Cognitive Status: Impaired/Different from baseline Arousal/Alertness: Awake/alert Orientation Level: Oriented X4 Attention: Sustained;Selective;Alternating;Divided Sustained Attention: Appears intact Selective Attention: Appears intact Alternating Attention: Impaired Alternating Attention Impairment: Functional complex Divided Attention: Impaired Divided Attention Impairment: Functional complex Memory: Appears intact Awareness: Impaired Awareness Impairment: Intellectual impairment Problem Solving: Impaired Problem Solving Impairment: Functional complex Executive Function: Reasoning;Sequencing;Organizing Reasoning: Impaired Reasoning Impairment: Functional complex Sequencing: Impaired Sequencing Impairment: Functional complex Organizing: Impaired Organizing Impairment: Functional complex Behaviors: Poor frustration tolerance Safety/Judgment: Appears intact    Comprehension  Auditory Comprehension Overall Auditory Comprehension: Impaired Yes/No Questions: Within Functional Limits Commands: Impaired Complex Commands: 75-100% accurate (requried repetition) Interfering Components: Visual impairments;Processing speed    Expression Verbal Expression Overall Verbal Expression: Appears within functional limits for tasks assessed Written Expression Dominant Hand: Right   Oral / Motor Oral Motor/Sensory Function Overall Oral Motor/Sensory Function: Appears within functional limits for tasks assessed Motor Speech Overall Motor Speech: Appears within functional limits for tasks assessed   GO    Harlon Ditty, MA CCC-SLP [phone]  Claudine Mouton 03/27/2013, 11:52 AM

## 2013-03-27 NOTE — Progress Notes (Signed)
Utilization review completed.  

## 2013-03-27 NOTE — Progress Notes (Addendum)
[facility] Progress Note [place] TEAM 1 - Stepdown/ICU TEAM   [name] ZOX:[id number] DOB: [birth year] DOA: 03/26/2013 PCP: Lilyan Punt, MD  Brief narrative: [name] is a 52 y.o. female with cerebral vascular malformation followed by Dr Lovell Sheehan, IBS and multiple medical problems as listed below who presents with the above complaints. She states that for the past 1week she has had severe headaches and dizziness. She describes the headaches as bifrontal, associated with as well as phonophobia and photophobia. She reports the dizziness- feels like she 'cannot get her bearings' and that she has been very unsteady and hit her on the bathroom door about 4times today. She admits to generalized weakness; denies focal weakness, dysphagia and no slurred speech. Pt also states she has had subjective fevers, cough productive of yellowish sputum and dysuria. She states she had nausea vomiting and diarrhea initially but none in the past couple of days. In the ED she had a CT which showed New cortical hypoattenuation in the posterior right frontal lobe is concerning for acute / subacute infarct, k 2.5 EKG with QT prolongation(QTc 518) sinus tach 114 & PVCs and wbc 16.  MRI revealed acute/subacute infarct rt parietal area.   Assessment/Plan: Principal Problem:   CVA- ischemic, right parietal - ASA started - PT/OT - f/u on carotid ultrasound and ECHO  Active Problems:   QT prolongation - QT improved to 0.46 with replacement of K+    Irritable bowel syndrome - stable    Persistent headaches -currently not an issue  Acute bronchitis/  Leukocytosis - resume Zithromax (IV due to nausea)    Hypokalemia - replaced  Nausea/ Vomiting - pt suspects it was either the codeine she was prescribed for her cough vs a bug she picked up from grandkids     Arthritis - stable    Code Status: full Family Communication: with husband Disposition Plan: transfer to neuro floor- possibly  home in AM  Consultants: Neuro  Procedures: none  Antibiotics: Zithromax  DVT prophylaxis: Lovenox  HPI/Subjective: Feels like left leg is dragging but all other neuro issues have resolved eg. Expressive aphasia and confusion.    Objective: Blood pressure 100/54, pulse 71, temperature 98.3 F (36.8 C), temperature source Oral, resp. rate 16, height 5\' 5"  (1.651 m), weight 69.9 kg (154 lb 1.6 oz), SpO2 96.00%.  Intake/Output Summary (Last 24 hours) at 03/27/13 1303 Last data filed at 03/27/13 1200  Gross per 24 hour  Intake 1527.5 ml  Output    402 ml  Net 1125.5 ml     Exam: General: No acute respiratory distress Lungs: Clear to auscultation bilaterally without wheezes or crackles Cardiovascular: Regular rate and rhythm without murmur gallop or rub normal S1 and S2 Abdomen: Nontender, nondistended, soft, bowel sounds positive, no rebound, no ascites, no appreciable mass Extremities: No significant cyanosis, clubbing, or edema bilateral lower extremities Neuro: strength 4/5 in left leg - otherwise 5/5, CN 2-12 intact.   Data Reviewed: Basic Metabolic Panel:  Recent Labs Lab 03/26/13 1249 03/26/13 1407 03/26/13 2036 03/27/13 0540  NA 138  --   --  138  K 2.5* 2.7*  --  3.9  CL 100  --   --  106  CO2 20  --   --  26  GLUCOSE 123*  --   --  88  BUN 14  --   --  10  CREATININE 0.60  --  0.61 0.66  CALCIUM 9.7  --   --  7.8*  Liver Function Tests: No results found for this basename: AST, ALT, ALKPHOS, BILITOT, PROT, ALBUMIN,  in the last 168 hours No results found for this basename: LIPASE, AMYLASE,  in the last 168 hours No results found for this basename: AMMONIA,  in the last 168 hours CBC:  Recent Labs Lab 03/26/13 1249 03/26/13 2036 03/27/13 0540  WBC 16.3* 13.9* 8.4  HGB 16.8* 14.6 12.8  HCT 46.0 40.2 36.7  MCV 90.9 90.5 93.9  PLT 305 280 242   Cardiac Enzymes: No results found for this basename: CKTOTAL, CKMB, CKMBINDEX, TROPONINI,  in the  last 168 hours BNP (last 3 results) No results found for this basename: PROBNP,  in the last 8760 hours CBG:  Recent Labs Lab 03/26/13 1342  GLUCAP 98    Recent Results (from the past 240 hour(s))  MRSA PCR SCREENING     Status: None   Collection Time    03/26/13  6:57 PM      Result Value Range Status   MRSA by PCR NEGATIVE  NEGATIVE Final   Comment:            The GeneXpert MRSA Assay (FDA     approved for NASAL specimens     only), is one component of a     comprehensive MRSA colonization     surveillance program. It is not     intended to diagnose MRSA     infection nor to guide or     monitor treatment for     MRSA infections.     Studies:  Recent x-ray studies have been reviewed in detail by the Attending Physician  Scheduled Meds:  Scheduled Meds: . ALPRAZolam  0.5 mg Oral BID  . aspirin  81 mg Oral Daily  . azithromycin  500 mg Intravenous Q24H  . clobetasol ointment  1 application Topical BID  . enoxaparin (LOVENOX) injection  40 mg Subcutaneous Q24H  . folic acid  1 mg Oral Daily  . gabapentin  600 mg Oral BID  . multivitamin with minerals  1 tablet Oral Daily  . pantoprazole  40 mg Oral BID AC  . rOPINIRole  1 mg Oral QPC supper  . verapamil  40 mg Oral QHS   Continuous Infusions: . sodium chloride 75 mL/hr at 03/26/13 2026    Time spent on care of this patient: 35 min   Calvert Cantor, MD [phone]  [facility] Office  [phone] Pager - Text Page per Loretha Stapler as per below:  On-Call/Text Page:      [email or web address]      password [email or web address]  If 7PM-7AM, please contact night-coverage [email or web address] Password [email or web address] 03/27/2013, 1:03 PM   LOS: 1 day

## 2013-03-27 NOTE — Evaluation (Signed)
Physical Therapy Evaluation Patient Details Name: [name] MRN: [id number] DOB: 08/04/1961 Today's Date: 03/27/2013 Time: 6213-0865 PT Time Calculation (min): 19 min  PT Assessment / Plan / Recommendation Clinical Impression  Patient is a 52 y/o female with h/o RA and PVD admitted for acute right parietal CVA.  Presents with global weakness, decreased activity tolerance, decreased balance and h/o falls.  Will benefit from skilled PT in the acute setting to maximize independence and allow return home with spouse assist and HHPT.    PT Assessment  Patient needs continued PT services    Follow Up Recommendations  Home health PT;Supervision/Assistance - 24 hour    Does the patient have the potential to tolerate intense rehabilitation      Barriers to Discharge None      Equipment Recommendations  None recommended by PT       Frequency Min 4X/week    Precautions / Restrictions Precautions Precautions: Fall Restrictions Weight Bearing Restrictions: No   Pertinent Vitals/Pain 4/10 HA      Mobility  Bed Mobility Bed Mobility: Supine to Sit Supine to Sit: 5: Supervision Details for Bed Mobility Assistance: increased time and needed assist for safety due to reports fell out of car one day last week Transfers Sit to Stand: 4: Min assist;With upper extremity assist;From bed Stand to Sit: 4: Min assist;With upper extremity assist;To chair/3-in-1 Details for Transfer Assistance: verbal cues for hand placement Ambulation/Gait Ambulation/Gait Assistance: 4: Min assist Ambulation Distance (Feet): 120 Feet Assistive device: Rolling walker Ambulation/Gait Assistance Details: decreased speed, dependent on UE assist, weak throughout core Gait Pattern: Step-through pattern;Trunk flexed;Decreased stride length;Decreased hip/knee flexion - right;Decreased hip/knee flexion - left Modified Rankin (Stroke Patients Only) Pre-Morbid Rankin Score: No symptoms Modified Rankin: Moderate  disability        PT Diagnosis: Abnormality of gait;Generalized weakness  PT Problem List: Decreased strength;Decreased activity tolerance;Decreased balance;Decreased mobility;Decreased safety awareness;Decreased knowledge of use of DME PT Treatment Interventions: Gait training;DME instruction;Balance training;Neuromuscular re-education;Stair training;Functional mobility training;Patient/family education;Therapeutic activities;Therapeutic exercise   PT Goals Acute Rehab PT Goals PT Goal Formulation: With patient/family Time For Goal Achievement: 04/10/13 Potential to Achieve Goals: Good Pt will go Supine/Side to Sit: with modified independence PT Goal: Supine/Side to Sit - Progress: Goal set today Pt will go Sit to Stand: with modified independence PT Goal: Sit to Stand - Progress: Goal set today Pt will Stand: with modified independence;3 - 5 min;with unilateral upper extremity support (during functional activity) PT Goal: Stand - Progress: Goal set today Pt will Ambulate: >150 feet;with least restrictive assistive device;with modified independence PT Goal: Ambulate - Progress: Goal set today Pt will Go Up / Down Stairs: 1-2 stairs;with rail(s);with supervision PT Goal: Up/Down Stairs - Progress: Goal set today  Visit Information  Last PT Received On: 03/27/13 Assistance Needed: +2 PT/OT Co-Evaluation/Treatment: Yes    Subjective Data  Subjective: Feel tired.  More than I've walked in a week Patient Stated Goal: To return home with spouse assist   Prior Functioning  Home Living Lives With: Spouse Available Help at Discharge: Family;Available 24 hours/day Type of Home: House Home Access: Stairs to enter [employer] of Steps: 2 Entrance Stairs-Rails: None Home Layout: One level Bathroom Shower/Tub: Walk-in shower;Door [employer] Toilet: Standard Home Adaptive Equipment: Straight cane Additional Comments: plans to build handrail, has access to a RW and maybe 3  in1 Prior Function Level of Independence: Independent with assistive device(s) Able to Take Stairs?: Yes Driving: Yes Vocation: Full time employment Comments: occasionally used  cane when RA acted up; out x 2 weeks due to illness 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 Dominant Hand: Right    Cognition  Cognition Arousal/Alertness: Awake/alert Behavior During Therapy: WFL for tasks assessed/performed Overall Cognitive Status: Within Functional Limits for tasks assessed    Extremity/Trunk Assessment Right Upper Extremity Assessment RUE ROM/Strength/Tone: Deficits RUE ROM/Strength/Tone Deficits: 4/3 wrist and gross grasp, pt has arthritis RUE Coordination: WFL - gross/fine motor Left Upper Extremity Assessment LUE ROM/Strength/Tone: Deficits LUE ROM/Strength/Tone Deficits: shoulder, elbow, wrist, gross grasp 4/5, pt has arthritis LUE Coordination: WFL - gross/fine motor Right Lower Extremity Assessment RLE ROM/Strength/Tone: Deficits RLE ROM/Strength/Tone Deficits: AROM WFL; strength hip flexion 3+/5, knee extension 4-/5, flexion 3+/5, ankle dorsiflexion 4/5 RLE Sensation: WFL - Light Touch Left Lower Extremity Assessment LLE ROM/Strength/Tone: Deficits LLE ROM/Strength/Tone Deficits: AROM WFL; strength hip flexion 3+/5, knee extension 4-/5, flexion 3+/5, ankle dorsiflexion 4/5 LLE Sensation: WFL - Light Touch Trunk Assessment Trunk Assessment: Normal   Balance Balance Balance Assessed: Yes Static Sitting Balance Static Sitting - Balance Support: Feet supported Static Sitting - Level of Assistance: 5: Stand by assistance Static Sitting - Comment/# of Minutes: 3  End of Session PT - End of Session Equipment Utilized During Treatment: Gait belt Activity Tolerance: Patient limited by fatigue Patient left: in chair;with call bell/phone within reach;with family/visitor present  GP     [facility] 03/27/2013, 11:32 AM Sheran Lawless,  PT [phone] 03/27/2013

## 2013-03-27 NOTE — Progress Notes (Signed)
Patient transfrred to [facility] per MD order, Report called to [place], RN. Patient will transfer in wheel chair with husband at bedside.

## 2013-03-27 NOTE — Progress Notes (Signed)
Echo Lab  2D Echocardiogram completed.  [name] [name], RDCS 03/27/2013 11:43 AM

## 2013-03-27 NOTE — Progress Notes (Signed)
VASCULAR LAB PRELIMINARY  PRELIMINARY  PRELIMINARY  PRELIMINARY  Carotid Dopplers completed.    Preliminary report:  There is 0-39% ICA stenosis.  Vertebral artery flow is antegrade.  Si Jachim, RVT 03/27/2013, 9:11 AM

## 2013-03-28 LAB — C3 COMPLEMENT: C3 Complement: 112 mg/dL (ref 90–180)

## 2013-03-28 LAB — SEDIMENTATION RATE: Sed Rate: 9 mm/hr (ref 0–22)

## 2013-03-28 MED ORDER — AZITHROMYCIN 250 MG PO TABS
250.0000 mg | ORAL_TABLET | Freq: Every day | ORAL | Status: DC
  Administered 2013-03-29: 250 mg via ORAL
  Filled 2013-03-28 (×2): qty 1

## 2013-03-28 NOTE — Discharge Summary (Signed)
DISCHARGE SUMMARY  SHERILYN WINDHORST  MR#: [id number]  DOB:29-Dec-1960  Date of Admission: 03/26/2013 Date of Discharge: 03/29/2013  Attending Physician:Palyn Scrima K  Patient's BJY:NWGNFA,OZHYQ, MD  Consults:Treatment Team:  Md Stroke, MD Neurology - Stroke Team   Disposition: D/C home   Follow-up Appts: Primary care provider as soon as possible Follow up with Dr. Pearlean Brownie in 4 weeks Follow up for outpatient TEE and holter monitor - to be scheduled     Follow-up Information   Schedule an appointment as soon as possible for a visit with Lilyan Punt, MD.   Contact information:   [address] [place] [address] [phone]       Follow up with Gates Rigg, MD In 4 weeks.   Contact information:   [address] [place] [address] [phone]       Follow up with Outpatient TEE, Outpatient telemetry monitoring.   Contact information:   Will be scheduled      Tests Needing Follow-up: Outpt SLP - HHPT/OT Follow up labs for hypercoagulable work up  Discharge Diagnoses: Right parietal lobe CVA QT Prolongation Irritable Bowel syndrome Acute bronchitis Arthritis  Initial presentation: 52 y.o. female with cerebral vascular malformation followed by Dr Lovell Sheehan, IBS, and multiple other medical problems who presented with dizziness and persistent headache. She stated that for 1week she had severe headaches and dizziness. She described the headaches as bifrontal, associated with phonophobia and photophobia. She reported she had been very unsteady and hit her head on her bathroom door about 4 times. She admitted to generalized weakness; denied focal weakness, dysphagia, or slurred speech. Pt also stated she had subjective fevers, cough productive of yellowish sputum and dysuria.   In the ED she had a CT which showed new cortical hypoattenuation in the posterior right frontal lobe concerning for acute / subacute infarct, K+ 2.5, EKG with QT  prolongation(QTc 518) sinus tach 114 & PVCs, and WBC 16. Neuro was consulted per EDP and she was admitted to Hospitalist service for further eval.  Hospital Course:  CVA - ischemic, right parietal lobe ASA started (was not on at home) - hypercoag w/u and vascultits w/u ordered, with labs drawn prior to d/c (but not yet resulted) - outpt TEE and tele monitor to be arranged per Stroke Team - to see Dr. Pearlean Brownie as an outpt in 4weeks for a f/u visit - HH PT and OT, as well as outpt SLP per therapy recs.  QT prolongation  QT improved to 0.46 with replacement of K+   Irritable bowel syndrome  stable   Persistent headaches  currently not an issue   Acute bronchitis/ Leukocytosis  - to complete a course of azithromycin   Hypokalemia  - replaced   Nausea/ Vomiting  - pt suspects it was either the codeine she was prescribed for her cough vs a bug she picked up from grandkids   Arthritis  stable   Overweight  Body mass index is 25.64   Procedures: TTE - 6/12 - mild LVH - EF 60% to 65% - wall motion was normal  Carotid dopplers - 6/12 - vertebral arteries appear patent with antegrade flow - less than 39 percent stenosis involving the right internal carotid artery and the left internal carotid artery  MRI brain 6/11 - Acute/subacute non hemorrhagic small to moderate size right parietal lobe infarct - right frontal lobe 1 cm cavernoma similar to prior exams  MRA brain 6/11 - progressive intracranial atherosclerotic type changes - 1.2 mm  aneurysm right internal carotid artery cavernous segment.    Medication List    TAKE these medications       ALPRAZolam 0.5 MG tablet  Commonly known as:  XANAX  Take 0.5 mg by mouth 2 (two) times daily.     aspirin 81 MG chewable tablet  Chew 1 tablet (81 mg total) by mouth daily.     azithromycin 250 MG tablet  Commonly known as:  ZITHROMAX  Take 1 tablet (250 mg total) by mouth daily.     clobetasol ointment 0.05 %  Commonly known as:   TEMOVATE  Apply 1 application topically 2 (two) times daily.     folic acid 1 MG tablet  Commonly known as:  FOLVITE  Take 1 mg by mouth daily.     gabapentin 300 MG capsule  Commonly known as:  NEURONTIN  Take 600 mg by mouth 2 (two) times daily.     HYDROcodone-acetaminophen 5-500 MG per tablet  Commonly known as:  VICODIN  Take 1 tablet by mouth every 6 (six) hours as needed for pain.     multivitamin capsule  Take 1 capsule by mouth daily.     NON FORMULARY  Place 1 spray into the nose 2 (two) times daily as needed. Lidocaine Spray.  Use as directed for headaches.     pantoprazole 40 MG tablet  Commonly known as:  PROTONIX  Take 1 tablet (40 mg total) by mouth daily.     PRESCRIPTION MEDICATION  Inject 10 mg into the muscle every Friday. Methotrexate 25 mg/ml.   10 mg = 0.4 ml     promethazine 25 MG tablet  Commonly known as:  PHENERGAN  Take 25 mg by mouth every 6 (six) hours as needed. For nausea     REMICADE IV  Inject 3 mg/kg into the vein every 8 (eight) weeks.     rOPINIRole 0.5 MG tablet  Commonly known as:  REQUIP  Take 2 tablets (1 mg total) by mouth daily after supper.     traMADol 50 MG tablet  Commonly known as:  ULTRAM  Take 50-100 mg by mouth 2 (two) times daily as needed. For pain     VENTOLIN HFA 108 (90 BASE) MCG/ACT inhaler  Generic drug:  albuterol  Inhale 2 puffs into the lungs every 6 (six) hours as needed.     verapamil 80 MG tablet  Commonly known as:  CALAN  Take 40 mg by mouth at bedtime. Takes 1/2 of 80 mg tab at hs        Day of Discharge BP 123/66  Pulse 72  Temp(Src) 98.5 F (36.9 C) (Oral)  Resp 18  Ht 5\' 5"  (1.651 m)  Wt 69.9 kg (154 lb 1.6 oz)  BMI 25.64 kg/m2  SpO2 99%  Physical Exam: General: No acute respiratory distress Lungs: Clear to auscultation bilaterally without wheezes or crackles Cardiovascular: Regular rate and rhythm without murmur gallop or rub normal S1 and S2 Abdomen: Nontender, nondistended,  soft, bowel sounds positive, no rebound, no ascites, no appreciable mass Extremities: No significant cyanosis, clubbing, or edema bilateral lower extremities  Results for orders placed during the hospital encounter of 03/26/13 (from the past 24 hour(s))  SEDIMENTATION RATE     Status: None   Collection Time    03/28/13  5:01 PM      Result Value Range   Sed Rate 9  0 - 22 mm/hr  C3 COMPLEMENT     Status: None  Collection Time    03/28/13  5:01 PM      Result Value Range   C3 Complement 112  90 - 180 mg/dL  C4 COMPLEMENT     Status: None   Collection Time    03/28/13  5:01 PM      Result Value Range   Complement C4, Body Fluid 28  10 - 40 mg/dL  RPR     Status: None   Collection Time    03/28/13  5:01 PM      Result Value Range   RPR NON REACTIVE  NON REACTIVE  HIV ANTIBODY (ROUTINE TESTING)     Status: None   Collection Time    03/28/13  5:01 PM      Result Value Range   HIV NON REACTIVE  NON REACTIVE  ANTITHROMBIN III     Status: None   Collection Time    03/28/13  5:01 PM      Result Value Range   AntiThromb III Func 104  75 - 120 %  HOMOCYSTEINE     Status: None   Collection Time    03/28/13  5:01 PM      Result Value Range   Homocysteine 7.8  4.0 - 15.4 umol/L  BASIC METABOLIC PANEL     Status: None   Collection Time    03/29/13  5:25 AM      Result Value Range   Sodium 138  135 - 145 mEq/L   Potassium 4.3  3.5 - 5.1 mEq/L   Chloride 102  96 - 112 mEq/L   CO2 26  19 - 32 mEq/L   Glucose, Bld 77  70 - 99 mg/dL   BUN 6  6 - 23 mg/dL   Creatinine, Ser 4.09  0.50 - 1.10 mg/dL   Calcium 8.8  8.4 - 81.1 mg/dL   GFR calc non Af Amer >90  >90 mL/min   GFR calc Af Amer >90  >90 mL/min    Time spent in discharge (includes decision making & examination of pt): 25 minutes  03/29/2013, 9:15 AM   Rickey Barbara, MD [facility] Office  [phone] Pager [phone]  On-Call/Text Page:      [email or web address]      password [facility]

## 2013-03-28 NOTE — Progress Notes (Signed)
Nutrition Brief Note  Patient identified on the Malnutrition Screening Tool (MST) Report  Pt with wt loss due to dehydration PTA due to acute illness, N/V. Weight is only down by 2 pounds at this time. Per pt her appetite is back to normal, she ate 100% of her Breakfast and is ready to go home.   Body mass index is 25.64 kg/(m^2). Patient meets criteria for overweight based on current BMI.   Current diet order is Regular, patient is consuming approximately 100% of meals at this time. Labs and medications reviewed.   No nutrition interventions warranted at this time. If nutrition issues arise, please consult RD.   Kendell Bane RD, LDN, CNSC [phone] Pager [phone] After Hours Pager

## 2013-03-28 NOTE — Progress Notes (Signed)
[facility] Progress Note [place] TEAM 1 - Stepdown/ICU TEAM   [name] ZOX:[id number] DOB: [birth year] DOA: 03/26/2013 PCP: Lilyan Punt, MD  Brief narrative: 52 y.o. female with cerebral vascular malformation followed by Dr Lovell Sheehan, IBS, and multiple other medical problems who presented with dizziness and persistent headache. She stated that for 1week she had severe headaches and dizziness. She described the headaches as bifrontal, associated with phonophobia and photophobia. She reported she had been very unsteady and hit her head on her bathroom door about 4 times. She admitted to generalized weakness; denied focal weakness, dysphagia, or slurred speech. Pt also stated she had subjective fevers, cough productive of yellowish sputum and dysuria.  In the ED she had a CT which showed new cortical hypoattenuation in the posterior right frontal lobe concerning for acute / subacute infarct, K+ 2.5, EKG with QT prolongation(QTc 518) sinus tach 114 & PVCs, and WBC 16. Neuro was consulted per EDP and she was admitted to Hospitalist service for further eval.   Assessment/Plan:  CVA - ischemic, right parietal lobe  ASA started (was not on at home) - hypercoag w/u and vascultits w/u ordered, with labs drawn prior to d/c (but not yet resulted) - outpt TEE and tele monitor to be arranged per Stroke Team - to see Dr. Pearlean Brownie as an outpt in 4weeks for a f/u visit - HH PT and OT, as well as SLP per therapy recs   QT prolongation  QT improved to 0.46 with replacement of K+   Irritable bowel syndrome  stable   Persistent headaches  Resolved at this time   Acute bronchitis/ Leukocytosis  To compete a course of azithromycin  Hypokalemia  replaced   Nausea/ Vomiting  pt suspects it was either the codeine she was prescribed for her cough vs a bug she picked up from grandkids - resolved at this time  Arthritis  stable   Overweight  Body mass index is 25.64   Code Status: FULL Family  Communication: no family present at time of exam  Disposition Plan: d/c home in AM  Consultants: Stroke Team   Procedures: TTE - 6/12 - mild LVH - EF 60% to 65% - wall motion was normal   Carotid dopplers - 6/12 - vertebral arteries appear patent with antegrade flow - less than 39 percent stenosis involving the right internal carotid artery and the left internal carotid artery   MRI brain 6/11 - Acute/subacute non hemorrhagic small to moderate size right parietal lobe infarct - right frontal lobe 1 cm cavernoma similar to prior exams   MRA brain 6/11 - progressive intracranial atherosclerotic type changes - 1.2 mm aneurysm right internal carotid artery cavernous segment.  Antibiotics: azithro 6/12 >>  DVT prophylaxis: lovenox  HPI/Subjective: Pt is resting comfortably in bed.  Denies n/v, ha, sob, f/c, or chest pain at this time.  No new neurologic complaints.    Objective: Blood pressure 103/62, pulse 85, temperature 98.1 F (36.7 C), temperature source Oral, resp. rate 20, height 5\' 5"  (1.651 m), weight 69.9 kg (154 lb 1.6 oz), SpO2 98.00%.  Intake/Output Summary (Last 24 hours) at 03/28/13 1726 Last data filed at 03/27/13 2104  Gross per 24 hour  Intake    120 ml  Output      0 ml  Net    120 ml   Exam: General: No acute respiratory distress Lungs: Clear to auscultation bilaterally without wheezes or crackles Cardiovascular: Regular rate and rhythm without murmur gallop or rub normal S1  and S2 Abdomen: Nontender, nondistended, soft, bowel sounds positive, no rebound, no ascites, no appreciable mass Extremities: No significant cyanosis, clubbing, or edema bilateral lower extremities  Data Reviewed: Basic Metabolic Panel:  Recent Labs Lab 03/26/13 1249 03/26/13 1407 03/26/13 2036 03/27/13 0540  NA 138  --   --  138  K 2.5* 2.7*  --  3.9  CL 100  --   --  106  CO2 20  --   --  26  GLUCOSE 123*  --   --  88  BUN 14  --   --  10  CREATININE 0.60  --  0.61 0.66   CALCIUM 9.7  --   --  7.8*   CBC:  Recent Labs Lab 03/26/13 1249 03/26/13 2036 03/27/13 0540  WBC 16.3* 13.9* 8.4  HGB 16.8* 14.6 12.8  HCT 46.0 40.2 36.7  MCV 90.9 90.5 93.9  PLT 305 280 242   CBG:  Recent Labs Lab 03/26/13 1342  GLUCAP 98    Recent Results (from the past 240 hour(s))  MRSA PCR SCREENING     Status: None   Collection Time    03/26/13  6:57 PM      Result Value Range Status   MRSA by PCR NEGATIVE  NEGATIVE Final   Comment:            The GeneXpert MRSA Assay (FDA     approved for NASAL specimens     only), is one component of a     comprehensive MRSA colonization     surveillance program. It is not     intended to diagnose MRSA     infection nor to guide or     monitor treatment for     MRSA infections.     Studies:  Recent x-ray studies have been reviewed in detail by the Attending Physician  Scheduled Meds:  Scheduled Meds: . ALPRAZolam  0.5 mg Oral BID  . aspirin  81 mg Oral Daily  . azithromycin  500 mg Intravenous Q24H  . clobetasol ointment  1 application Topical BID  . enoxaparin (LOVENOX) injection  40 mg Subcutaneous Q24H  . folic acid  1 mg Oral Daily  . gabapentin  600 mg Oral BID  . multivitamin with minerals  1 tablet Oral Daily  . pantoprazole  40 mg Oral BID AC  . rOPINIRole  1 mg Oral QPC supper  . verapamil  40 mg Oral QHS    Time spent on care of this patient:   [facility]  [facility] Office  [phone] Pager - Text Page per Loretha Stapler as per below:  On-Call/Text Page:      [email or web address]      password [email or web address]  If 7PM-7AM, please contact night-coverage [email or web address] Password [email or web address] 03/28/2013, 5:26 PM   LOS: 2 days

## 2013-03-28 NOTE — Progress Notes (Signed)
Physical Therapy Treatment Patient Details Name: [name] MRN: [id number] DOB: [birth year] Today's Date: 03/28/2013 Time: 4098-1191 PT Time Calculation (min): 35 min  PT Assessment / Plan / Recommendation Comments on Treatment Session  Emphasized normalizing gait, decreasing reaction times to balance challenge.  Pt would likely be a great OP PT candidate.    Follow Up Recommendations  Outpatient PT     Does the patient have the potential to tolerate intense rehabilitation     Barriers to Discharge        Equipment Recommendations  None recommended by PT    Recommendations for Other Services    Frequency     Plan Frequency remains appropriate;Discharge plan needs to be updated    Precautions / Restrictions Precautions Precautions: Fall Restrictions Weight Bearing Restrictions: No   Pertinent Vitals/Pain     Mobility  Bed Mobility Bed Mobility: Rolling Right;Right Sidelying to Sit;Sitting - Scoot to [place] of Bed;Sit to Supine Rolling Right: 4: Min guard Right Sidelying to Sit: 4: Min guard Sitting - Scoot to [place] of Bed: 5: Supervision Sit to Supine: 5: Supervision Details for Bed Mobility Assistance: increased time to process and sequence the task Transfers Transfers: Sit to Stand;Stand to Sit Sit to Stand: 4: Min guard;With upper extremity assist;From bed Stand to Sit: 4: Min guard;With upper extremity assist;To bed Details for Transfer Assistance: safe technique Ambulation/Gait Ambulation/Gait Assistance: 4: Min assist Ambulation Distance (Feet): 190 Feet Assistive device: Other (Comment) (pushing IV pole) Ambulation/Gait Assistance Details: worked on [employer] heel/toe pattern.  pt able to isolate some movements, but still showed too much foot slap on contact, excessive lateral w/shift and lacking good forward translationof the hip on the R. Gait Pattern: Step-through pattern;Decreased step length - right;Decreased step length - left;Decreased stride  length Stairs: No Wheelchair Mobility Wheelchair Mobility: No Modified Rankin (Stroke Patients Only) Modified Rankin: Moderate disability    Exercises     PT Diagnosis:    PT Problem List:   PT Treatment Interventions:     PT Goals Acute Rehab PT Goals Time For Goal Achievement: 04/10/13 Potential to Achieve Goals: Good Pt will go Supine/Side to Sit: with modified independence PT Goal: Supine/Side to Sit - Progress: Progressing toward goal Pt will go Sit to Stand: with modified independence PT Goal: Sit to Stand - Progress: Progressing toward goal Pt will Stand: with modified independence;3 - 5 min;with unilateral upper extremity support PT Goal: Stand - Progress: Progressing toward goal Pt will Ambulate: >150 feet;with least restrictive assistive device;with modified independence PT Goal: Ambulate - Progress: Progressing toward goal Pt will Go Up / Down Stairs: 1-2 stairs;with rail(s);with supervision PT Goal: Up/Down Stairs - Progress: Not met  Visit Information  Last PT Received On: 03/28/13 Assistance Needed: +1    Subjective Data  Subjective: I just get so frustrated that I have to just lay down   Cognition  Cognition Arousal/Alertness: Awake/alert Behavior During Therapy: WFL for tasks assessed/performed Overall Cognitive Status: Impaired/Different from baseline Area of Impairment: Problem solving;Attention Current Attention Level: Alternating Problem Solving: Slow processing    Balance  Static Sitting Balance Static Sitting - Balance Support: Feet supported Static Sitting - Level of Assistance: 5: Stand by assistance Dynamic Sitting Balance Dynamic Sitting - Balance Support: Feet supported;No upper extremity supported Dynamic Sitting - Level of Assistance: Other (comment) (pt could resist input) Dynamic Sitting Balance - Compensations: pt could resist perturbation from all directions, but unable to process tactile perturbation fast enough to react quickly and  hold  hersel in midline.  End of Session PT - End of Session Activity Tolerance: Patient limited by fatigue Patient left: in bed;with call bell/phone within reach Nurse Communication: Mobility status   GP     [name], Eliseo Gum 03/28/2013, 5:44 PM 03/28/2013  [place], PT [phone]  (pager)

## 2013-03-28 NOTE — Progress Notes (Signed)
Stroke Team Progress Note  HISTORY HPI: [name] is an 52 y.o. female a history of irritable bowel syndrome, peripheral vascular disease, intracranial vascular malformations, rheumatoid arthritis and sleep apnea presenting with complaint of unstable gait and dizziness for starting on 03/25/2013, in addition to feeling generally weak. CT scan of her head showed equivocal right frontal abnormal low density area suggestive of stroke. MRI, however, showed findings consistent with right parietal acute ischemic infarction. MRA showed no large vessel occlusion or significant stenosis. Both anterior cerebral arteries arose from the right ICA. Patient said no changes in speech and no left-sided weakness.   LSN: 03/25/2013  tPA Given: No: Beyond time under for treatment consideration  MRankin: 1  SUBJECTIVE  Patient describes still with mild headache. Scotoma unilaterally. Has had a few migraines before. Feels better though  OBJECTIVE Most recent Vital Signs: Filed Vitals:   03/28/13 0220 03/28/13 0516 03/28/13 1015 03/28/13 1400  BP: 107/58 112/62 111/60 103/62  Pulse: 79 68 66 85  Temp: 98.1 F (36.7 C) 98.9 F (37.2 C) 98.4 F (36.9 C) 98.1 F (36.7 C)  TempSrc: Oral Oral Oral Oral  Resp: 20 20 18 20   Height:      Weight:      SpO2: 97% 99% 98% 98%   CBG (last 3)   Recent Labs  03/26/13 1342  GLUCAP 98    IV Fluid Intake:   . sodium chloride 75 mL/hr at 03/28/13 1248    MEDICATIONS  . ALPRAZolam  0.5 mg Oral BID  . aspirin  81 mg Oral Daily  . azithromycin  500 mg Intravenous Q24H  . clobetasol ointment  1 application Topical BID  . enoxaparin (LOVENOX) injection  40 mg Subcutaneous Q24H  . folic acid  1 mg Oral Daily  . gabapentin  600 mg Oral BID  . multivitamin with minerals  1 tablet Oral Daily  . pantoprazole  40 mg Oral BID AC  . rOPINIRole  1 mg Oral QPC supper  . verapamil  40 mg Oral QHS   PRN:  acetaminophen, acetaminophen, acetaminophen, albuterol, alum  & mag hydroxide-simeth, HYDROcodone-acetaminophen, morphine injection, ondansetron, traMADol  Diet:  General thin liquids Activity:  ambulate DVT Prophylaxis:  lovenox  CLINICALLY SIGNIFICANT STUDIES Basic Metabolic Panel:  Recent Labs Lab 03/26/13 1249 03/26/13 1407 03/26/13 2036 03/27/13 0540  NA 138  --   --  138  K 2.5* 2.7*  --  3.9  CL 100  --   --  106  CO2 20  --   --  26  GLUCOSE 123*  --   --  88  BUN 14  --   --  10  CREATININE 0.60  --  0.61 0.66  CALCIUM 9.7  --   --  7.8*   Liver Function Tests: No results found for this basename: AST, ALT, ALKPHOS, BILITOT, PROT, ALBUMIN,  in the last 168 hours CBC:  Recent Labs Lab 03/26/13 2036 03/27/13 0540  WBC 13.9* 8.4  HGB 14.6 12.8  HCT 40.2 36.7  MCV 90.5 93.9  PLT 280 242   Coagulation: No results found for this basename: LABPROT, INR,  in the last 168 hours Cardiac Enzymes: No results found for this basename: CKTOTAL, CKMB, CKMBINDEX, TROPONINI,  in the last 168 hours Urinalysis:  Recent Labs Lab 03/27/13 0821  COLORURINE AMBER*  LABSPEC 1.026  PHURINE 6.0  GLUCOSEU NEGATIVE  HGBUR NEGATIVE  BILIRUBINUR SMALL*  KETONESUR 15*  PROTEINUR NEGATIVE  UROBILINOGEN 1.0  NITRITE  NEGATIVE  LEUKOCYTESUR NEGATIVE   Lipid Panel    Component Value Date/Time   CHOL 139 03/27/2013 0540   TRIG 135 03/27/2013 0540   HDL 23* 03/27/2013 0540   CHOLHDL 6.0 03/27/2013 0540   VLDL 27 03/27/2013 0540   LDLCALC 89 03/27/2013 0540   HgbA1C  Lab Results  Component Value Date   HGBA1C 5.6 03/27/2013    Urine Drug Screen:   No results found for this basename: labopia, cocainscrnur, labbenz, amphetmu, thcu, labbarb    Alcohol Level: No results found for this basename: ETH,  in the last 168 hours    Mr Brain Wo Contrast 03/26/2013   *RADIOLOGY REPORT*  Clinical Data:  Vertigo with several falls.  Known cavernoma.  MRI BRAIN WITHOUT CONTRAST MRA HEAD WITHOUT CONTRAST  Technique: Multiplanar, multiecho pulse sequences of  the brain and surrounding structures were obtained according to standard protocol without intravenous contrast.  Angiographic images of the head were obtained using MRA technique without contrast.  Comparison: 03/26/2013 head CT.  08/08/2012 brain MR.  09/27/2010 brain MR.  07/13/2010 MR angiogram circle of Willis  MRI HEAD  Findings:  Acute/subacute non hemorrhagic small to moderate size right parietal lobe infarct.  Right frontal lobe 1 cm cavernoma similar to prior exams.  The previously noted capillary telangiectasia of the right hippocampus is not appreciated present exam as contrast was not administered.  No hydrocephalus.  Major intracranial vascular structures are patent.  Paranasal sinus opacification most notable involving the maxillary sinuses where there may be an air-fluid level on the left suggesting acute on chronic sinusitis.  Cervical medullary junction, pineal region, pituitary region and orbital structures unremarkable.  IMPRESSION: Acute/subacute non hemorrhagic small to moderate size right parietal lobe infarct.  Right frontal lobe 1 cm cavernoma similar to prior exams.  Paranasal sinus opacification most notable involving the maxillary sinuses where there may be an air-fluid level on the left suggesting acute on chronic sinusitis.  MRA HEAD  Findings: Asymmetric caliber of the internal carotid arteries with the left internal carotid artery small than right.  It is possible that aplastic A1 segment of the left anterior cerebral artery partially contributes to this finding which was noted previously. Proximal stenosis not excluded.  Narrowing irregularity of the supraclinoid aspect of left internal carotid artery and the M1 segment of the left middle cerebral artery.  1.2 mm aneurysm superior margin of the right internal carotid artery cavernous segment suspected.  Middle cerebral artery moderate branch vessel irregularity and narrowing bilaterally.  Mild to moderate narrowing A2 segment of the  anterior cerebral artery bilaterally greater on the left.  Ectatic vertebral arteries and basilar artery.  Right vertebral artery is dominant.  Narrowing of the distal left vertebral artery.  Irregular poorly delineated PICAs.  Narrowed irregular basilar artery.  Slightly bulbous appearance of the basilar tip without saccular aneurysm.  Nonvisualization AICA.  Moderate narrowing involving portions of the superior cerebral arteries and posterior cerebral arteries bilaterally.  IMPRESSION: Progressive intracranial atherosclerotic type changes as detailed above.  Suggestion 1.2 mm aneurysm right internal carotid artery cavernous segment.  Critical Value/emergent results were called by telephone at the time of interpretation on 03/26/2013  at 7:40 p.m. to [place] the patient's nurse, who verbally acknowledged these results.   Original Report Authenticated By: Lacy Duverney, M.D.   Dg Chest Port 1 View 03/26/2013   *RADIOLOGY REPORT*  Clinical Data: Chest pain.  PORTABLE CHEST - 1 VIEW  Comparison: 05/16/2012 CT.  05/10/2012 chest x-ray.  Findings:  Calcified mildly tortuous aorta.  No infiltrate, congestive heart failure or pneumothorax.  Heart size within normal limits.  IMPRESSION: No acute abnormality.  Please see above.   Original Report Authenticated By: Lacy Duverney, M.D.    CT of the brain    2D Echocardiogram  EF 60%. No wall motion abnormalities  Carotid Doppler  There is 0-39% ICA stenosis. Vertebral artery flow is antegrade  EKG  normal sinus rhythm.   Therapy Recommendations outpatient  Filed Vitals:   03/29/13 1001  BP: 121/57  Pulse: 69  Temp: 98.2 F (36.8 C)  Resp: 20     Physical Exam   Awake alert. Afebrile. Head is nontraumatic. Neck is supple without bruit. Hearing is normal. Cardiac exam no murmur or gallop. Lungs are clear to auscultation. Distal pulses are well felt. Neurologic Examination:  Mental Status:  Alert, oriented x3 and complaining of severe headache Speech  fluent without evidence of aphasia. Able to follow commands without difficulty.  Cranial Nerves:  II-equivocal left visual field deficit.  III/IV/VI-Pupils were equal and reacted. Extraocular movements were full and conjugate.  V/VII-no facial numbness and no facial weakness.  VIII-normal.  X-normal speech and symmetrical palatal movement.  Motor: 5/5 bilaterally with normal tone and bulk  Sensory: Normal throughout.  Deep Tendon Reflexes: 2+ and symmetric.  Plantars: Flexor bilaterally  Cerebellar: FTN normal bilaterally  ASSESSMENT [name] is a 52 y.o. female presenting with dizziness, weakness and headache. Imaging confirms acute/subacute non hemorrhagic small to moderate size right parietal lobe infarct. Infarct felt to be embolic secondary to unknown source.  On no antithrombotics prior to admission (question of being on plavix PTA-no known indications and denied by patient to me). Now on aspirin 81 mg orally every day for secondary stroke prevention. Patient with resultant headache (improved on my exam). Work up underway.   Sleep apnea  Rheumatoid arthritis   Cerebral vascular malformation  Long term medication use  Hypokalemia, repleted.  Hospital day # 2  TREATMENT/PLAN  Continue aspirin 81 mg orally every day for secondary stroke prevention.  Hypercoaguable workup, vasculitic workup  Will need outpatient TEE, outpatient telemetry monitoring  Followup with Dr. Pearlean Brownie in 4 weeks for office visit to have TCD monitoring.  Gwendolyn Lima. Manson Passey, [facility], MBA, MHA [facility] Pager: [phone] 03/28/2013 3:27 PM  I have personally obtained a history, examined the patient, evaluated imaging results, and formulated the assessment and plan of care. I agree with the above. Delia Heady, MD

## 2013-03-28 NOTE — Progress Notes (Signed)
Occupational Therapy Treatment Patient Details Name: [name] MRN: [id number] DOB: [birth year] Today's Date: 03/28/2013 Time: 4098-1191 OT Time Calculation (min): 47 min  OT Assessment / Plan / Recommendation Comments on Treatment Session  Pt with significant visual impairment that severely impacts all aspects of functional mobility and ADLs.  Recommend Neuro ophthalmology consult at discharge, OPOT, no driving, and 24 hour assist at discharge.  Pt presenting with Lt.   Pt has been instructed in safety issues related to visual deficits, but will continue education as well as provide initial HEP to improve visual scanning.     Follow Up Recommendations  Outpatient OT;Supervision/Assistance - 24 hour    Barriers to Discharge       Equipment Recommendations  3 in 1 bedside comode    Recommendations for Other Services    Frequency Min 2X/week   Plan Discharge plan needs to be updated    Precautions / Restrictions Precautions Precautions: Fall Restrictions Weight Bearing Restrictions: No   Pertinent Vitals/Pain     ADL  ADL Comments: Vision assesment:  Pt with full EOMs; Pursuits:  pt loses fixation Lt inferior quadrant consistently, and Lt. middle and upper quadrants inconsistently; Fields:  Pt with Lt hemianopsia.  Pt + for Bonnet's phenomenon - reports "worms" in Lt inferior field.  Functionally:  When pt looking at clock, she reports signficant blurring of numbers 6-9.  She is able to read large and medium sized newspaper headlines with increased time and effort - tilts head significantly to Rt. and down to do so.  Even with anchor identifying Lt. margin, and using her finger to guide scanning, she continues to struggle. She is noted to miss, and misidentify letters when performing a letter cancellation task.  Convergence:  Impaired to at least 22" from nose.  Pt reports she has new lenses in glasses and wears trifocals.  Long discussion with pt about vision.  Recommend she see a  neuro ophthalmologist upon leaving hospital.  Disucssed use of anchors on Lt. margin to improve scanning, but vision is marginally functional for pen paper tasks due to decreased acuity.  Pt also with complaint of headache with visual activities.  Also discussed need for direct supervision/assist when ambulating to avoid falls/injuries, and discussed avoid community environments until scanning improved with therapies.  Discussed need for husband to walk on her Lt. side when out of the house due to risk of injury or fall due to not seein objects    OT Diagnosis:    OT Problem List:   OT Treatment Interventions:     OT Goals Acute Rehab OT Goals OT Goal Formulation: With patient Time For Goal Achievement: 04/10/13 Potential to Achieve Goals: Good ADL Goals Pt Will Perform Grooming: with supervision;Standing at sink Pt Will Perform Upper Body Bathing: with set-up;Sitting at sink Pt Will Perform Lower Body Bathing: with supervision;Standing at sink;Sitting at sink Pt Will Perform Upper Body Dressing: with set-up;Sitting, chair Pt Will Perform Lower Body Dressing: with supervision;Sit to stand from chair Pt Will Transfer to Toilet: with supervision;Ambulation;with DME Pt Will Perform Toileting - Clothing Manipulation: with supervision;Standing Pt Will Perform Toileting - Hygiene: with supervision;Sit to stand from 3-in-1/toilet Pt Will Perform Tub/Shower Transfer: Shower transfer;Ambulation;with supervision;with DME Additional ADL Goal #1: Pt/spouse will be independent with visual compensation strategies ADL Goal: Additional Goal #1 - Progress: Goal set today  Visit Information  Last OT Received On: 03/28/13 Assistance Needed: +1    Subjective Data      Prior Functioning  Cognition  Cognition Arousal/Alertness: Awake/alert Behavior During Therapy: WFL for tasks assessed/performed Overall Cognitive Status: Within Functional Limits for tasks assessed Area of Impairment: Problem  solving;Attention Current Attention Level: Alternating Problem Solving: Slow processing General Comments: cognition not formally assesed during session.  Pt reports she is having difficulty with math    Mobility  Bed Mobility Bed Mobility: Rolling Right;Right Sidelying to Sit;Sitting - Scoot to [place] of Bed;Sit to Supine Rolling Right: 4: Min guard Right Sidelying to Sit: 4: Min guard Sitting - Scoot to Edge of Bed: 5: Supervision Sit to Supine: 6: Modified independent (Device/Increase time);HOB flat Details for Bed Mobility Assistance: increased time to process and sequence the task Transfers Sit to Stand: 4: Min guard;With upper extremity assist;From bed Stand to Sit: 4: Min guard;With upper extremity assist;To bed Details for Transfer Assistance: safe technique    Exercises      Balance Static Sitting Balance Static Sitting - Balance Support: Feet supported Static Sitting - Level of Assistance: 5: Stand by assistance Dynamic Sitting Balance Dynamic Sitting - Balance Support: Feet supported;No upper extremity supported Dynamic Sitting - Level of Assistance: Other (comment) (pt could resist input) Dynamic Sitting Balance - Compensations: pt could resist perturbation from all directions, but unable to process tactile perturbation fast enough to react quickly and hold hersel in midline.   End of Session OT - End of Session Activity Tolerance: Patient limited by pain Patient left: in bed;with call bell/phone within reach;with bed alarm set  GO     Jaryn Hocutt, Ursula Alert M 03/28/2013, 6:41 PM

## 2013-03-29 DIAGNOSIS — J209 Acute bronchitis, unspecified: Secondary | ICD-10-CM

## 2013-03-29 LAB — HOMOCYSTEINE: Homocysteine: 7.8 umol/L (ref 4.0–15.4)

## 2013-03-29 LAB — BASIC METABOLIC PANEL
BUN: 6 mg/dL (ref 6–23)
Calcium: 8.8 mg/dL (ref 8.4–10.5)
GFR calc Af Amer: >90 mL/min (ref >90)
GFR calc non Af Amer: >90 mL/min (ref >90)
Glucose, Bld: 77 mg/dL (ref 70–99)
Potassium: 4.3 mEq/L (ref 3.5–5.1)
Sodium: 138 mEq/L (ref 135–145)

## 2013-03-29 MED ORDER — AZITHROMYCIN 250 MG PO TABS: 250.0000 mg | ORAL_TABLET | Freq: Every day | ORAL | Status: DC

## 2013-03-29 MED ORDER — ONDANSETRON HCL 4 MG PO TABS: 4.0000 mg | ORAL_TABLET | Freq: Three times a day (TID) | ORAL | Status: DC | PRN

## 2013-03-29 MED ORDER — ASPIRIN 81 MG PO CHEW: 81.0000 mg | CHEWABLE_TABLET | Freq: Every day | ORAL | Status: DC

## 2013-03-29 NOTE — Progress Notes (Signed)
Pt. Discharged home with scripts and home instructions for safety measures.Spouse at bedside during discharge. Questions answered and patient does not want to participate in [facility] the hospital.

## 2013-03-29 NOTE — Progress Notes (Signed)
Stroke Team Progress Note  HISTORY [name] is a 52 y.o. female with a history of irritable bowel syndrome, peripheral vascular disease, intracranial vascular malformations, rheumatoid arthritis and sleep apnea presenting with complaint of unstable gait and dizziness starting on 03/25/2013, in addition to feeling generally weak. CT scan of her head showed equivocal right frontal abnormal low density area suggestive of stroke. MRI, however, showed findings consistent with right parietal acute ischemic infarction. MRA showed no large vessel occlusion or significant stenosis. Both anterior cerebral arteries arose from the right ICA. Patient said no changes in speech and no left-sided weakness.   LSN: 03/25/2013  tPA Given: No: Beyond time under for treatment consideration  MRankin: 1  SUBJECTIVE The patient's husband was at the bedside. The plan is for discharge today. The patient states that she has a mild headache. She has no other complaints.  OBJECTIVE Most recent Vital Signs: Filed Vitals:   03/28/13 2200 03/29/13 0200 03/29/13 0600 03/29/13 1001  BP: 125/46 119/71 123/66 121/57  Pulse: 63 66 72 69  Temp: 98.3 F (36.8 C) 98.7 F (37.1 C) 98.5 F (36.9 C) 98.2 F (36.8 C)  TempSrc: Oral Oral Oral Oral  Resp: 18 18 18 20   Height:      Weight:      SpO2: 100% 97% 99% 97%   CBG (last 3)   Recent Labs  03/26/13 1342  GLUCAP 98    IV Fluid Intake:      MEDICATIONS  . ALPRAZolam  0.5 mg Oral BID  . aspirin  81 mg Oral Daily  . azithromycin  250 mg Oral Daily  . clobetasol ointment  1 application Topical BID  . enoxaparin (LOVENOX) injection  40 mg Subcutaneous Q24H  . folic acid  1 mg Oral Daily  . gabapentin  600 mg Oral BID  . multivitamin with minerals  1 tablet Oral Daily  . pantoprazole  40 mg Oral BID AC  . rOPINIRole  1 mg Oral QPC supper  . verapamil  40 mg Oral QHS   PRN:  acetaminophen, acetaminophen, albuterol, alum & mag hydroxide-simeth,  HYDROcodone-acetaminophen, morphine injection, ondansetron, traMADol  Diet:  General thin liquids Activity:  ambulate DVT Prophylaxis:  lovenox  CLINICALLY SIGNIFICANT STUDIES Basic Metabolic Panel:   Recent Labs Lab 03/27/13 0540 03/29/13 0525  NA 138 138  K 3.9 4.3  CL 106 102  CO2 26 26  GLUCOSE 88 77  BUN 10 6  CREATININE 0.66 0.53  CALCIUM 7.8* 8.8   Liver Function Tests: No results found for this basename: AST, ALT, ALKPHOS, BILITOT, PROT, ALBUMIN,  in the last 168 hours CBC:   Recent Labs Lab 03/26/13 2036 03/27/13 0540  WBC 13.9* 8.4  HGB 14.6 12.8  HCT 40.2 36.7  MCV 90.5 93.9  PLT 280 242   Coagulation: No results found for this basename: LABPROT, INR,  in the last 168 hours Cardiac Enzymes: No results found for this basename: CKTOTAL, CKMB, CKMBINDEX, TROPONINI,  in the last 168 hours Urinalysis:   Recent Labs Lab 03/27/13 0821  COLORURINE AMBER*  LABSPEC 1.026  PHURINE 6.0  GLUCOSEU NEGATIVE  HGBUR NEGATIVE  BILIRUBINUR SMALL*  KETONESUR 15*  PROTEINUR NEGATIVE  UROBILINOGEN 1.0  NITRITE NEGATIVE  LEUKOCYTESUR NEGATIVE   Lipid Panel    Component Value Date/Time   CHOL 139 03/27/2013 0540   TRIG 135 03/27/2013 0540   HDL 23* 03/27/2013 0540   CHOLHDL 6.0 03/27/2013 0540   VLDL 27 03/27/2013 0540  LDLCALC 89 03/27/2013 0540   HgbA1C  Lab Results  Component Value Date   HGBA1C 5.6 03/27/2013    Urine Drug Screen:   No results found for this basename: labopia,  cocainscrnur,  labbenz,  amphetmu,  thcu,  labbarb    Alcohol Level: No results found for this basename: ETH,  in the last 168 hours    Mr Brain Wo Contrast 03/26/2013  Acute/subacute non hemorrhagic small to moderate size right parietal lobe infarct.  Right frontal lobe 1 cm cavernoma similar to prior exams.     MRA Findings: Asymmetric caliber of the internal carotid arteries with  the left internal carotid artery small than right. It is possible  that aplastic A1 segment  of the left anterior cerebral artery  partially contributes to this finding which was noted previously.  Proximal stenosis not excluded.  Narrowing irregularity of the supraclinoid aspect of left internal  carotid artery and the M1 segment of the left middle cerebral  artery.  1.2 mm aneurysm superior margin of the right internal carotid  artery cavernous segment suspected.  Middle cerebral artery moderate branch vessel irregularity and  narrowing bilaterally.  Mild to moderate narrowing A2 segment of the anterior cerebral  artery bilaterally greater on the left.  Ectatic vertebral arteries and basilar artery. Right vertebral  artery is dominant. Narrowing of the distal left vertebral artery.  Irregular poorly delineated PICAs.  Narrowed irregular basilar artery. Slightly bulbous appearance of  the basilar tip without saccular aneurysm.  Nonvisualization AICA.  Moderate narrowing involving portions of the superior cerebral  arteries and posterior cerebral arteries bilaterally.   Dg Chest Port 1 View 03/26/2013 No acute abnormality.   2D Echocardiogram  EF 60%. No wall motion abnormalities  Carotid Doppler  There is 0-39% ICA stenosis. Vertebral artery flow is antegrade  EKG  normal sinus rhythm.   Therapy Recommendations outpatient    Physical Exam   Neurologic Examination:  Mental Status:  Alert, oriented x3. Speech fluent without evidence of aphasia. Able to follow commands without difficulty.  Cranial Nerves:  II- left visual field deficit.  III/IV/VI-Pupils were equal and reacted. Extraocular movements were full and conjugate.  V/VII-no facial numbness and no facial weakness.  VIII-normal.  X-normal speech and symmetrical palatal movement.  Motor: 5/5 right upper and lower extremities. 4+/5 left upper and lower extremities. with normal tone and bulk  Sensory: Normal throughout.  Deep Tendon Reflexes: 2+ and symmetric.  Plantars: Flexor bilaterally  Cerebellar: FTN  with mild difficulty using the left upper extremity.  ASSESSMENT [name] is a 52 y.o. female presenting with dizziness, weakness and headache. Imaging confirms acute/subacute non hemorrhagic small to moderate size right parietal lobe infarct. Infarct felt to be embolic secondary to unknown source.  On no antithrombotics prior to admission (question of being on plavix PTA-no known indications and denied by patient to me). Now on aspirin 81 mg orally every day for secondary stroke prevention. . Work up complete.   Sleep apnea  Rheumatoid arthritis   Cerebral vascular malformation  Long term medication use  Hypokalemia, repleted.  Hospital day # 3  TREATMENT/PLAN  Continue aspirin 81 mg orally every day for secondary stroke prevention.  Hypercoaguable workup, vasculitic workup  Will need outpatient TEE, outpatient telemetry monitoring  Followup with Dr. Pearlean Brownie in 4 weeks for office visit to have TCD monitoring.  Outpatient therapies planned.  Delton See PA-C [facility] Pager [phone] 03/29/2013, 12:10 PM  I have personally obtained  a history, examined the patient, evaluated imaging results, and formulated the assessment and plan of care. I agree with the above.  Lesly Dukes

## 2013-03-30 NOTE — Progress Notes (Signed)
   CARE MANAGEMENT NOTE 03/30/2013  Patient:  [name],[name]   Account Number:  [id number]  Date Initiated:  03/27/2013  Documentation initiated by:  Donn Pierini  Subjective/Objective Assessment:   Pt admitted with HA, hypokalemia, MRI + for CVA-     Action/Plan:   PTA pt lived at home- PT/OT evals ordered- NCM to follow for recommendations   Anticipated DC Date:  03/31/2013   Anticipated DC Plan:  SKILLED NURSING FACILITY      DC Planning Services  CM consult      [facility] Choice  HOME HEALTH   Choice offered to / List presented to:  C-1 Patient        HH arranged  HH-2 PT  HH-3 OT  HH-5 SPEECH THERAPY      HH agency  [facility]   Status of service:  Completed, signed off Medicare Important Message given?   (If response is "NO", the following Medicare IM given date fields will be blank) Date Medicare IM given:   Date Additional Medicare IM given:    Discharge Disposition:  HOME W HOME HEALTH SERVICES  Per UR Regulation:  Reviewed for med. necessity/level of care/duration of stay  If discussed at Long Length of Stay Meetings, dates discussed:    Comments:  03/30/2013 1510 NCM spoke to pt and states she lives at home with husband. She has RW, 3n1 and cane at home. Offered choice for [facility]. Pt agreeable to [facility] for HH. Isidoro Donning RN CCM Case Mgmt phone [phone]

## 2013-03-31 LAB — LUPUS ANTICOAGULANT PANEL: PTT Lupus Anticoagulant: 36.7 secs (ref 28.0–43.0)

## 2013-03-31 LAB — PROTEIN C, TOTAL: Protein C, Total: 75 % (ref 72–160)

## 2013-03-31 LAB — PROTEIN C ACTIVITY: Protein C Activity: 152 % — ABNORMAL HIGH (ref 75–133)

## 2013-03-31 LAB — PROTEIN S ACTIVITY: Protein S Activity: 98 % (ref 69–129)

## 2013-03-31 LAB — CARDIOLIPIN ANTIBODIES, IGG, IGM, IGA
Anticardiolipin IgG: 15 GPL U/mL (ref <23)
Anticardiolipin IgM: 10 MPL U/mL — ABNORMAL LOW (ref <11)

## 2013-04-09 ENCOUNTER — Other Ambulatory Visit: Payer: Self-pay | Admitting: Neurology

## 2013-04-09 DIAGNOSIS — I6350 Cerebral infarction due to unspecified occlusion or stenosis of unspecified cerebral artery: Secondary | ICD-10-CM

## 2013-04-10 ENCOUNTER — Other Ambulatory Visit (HOSPITAL_COMMUNITY): Payer: Self-pay | Admitting: Family Medicine

## 2013-04-10 DIAGNOSIS — R1310 Dysphagia, unspecified: Secondary | ICD-10-CM

## 2013-04-14 ENCOUNTER — Telehealth: Payer: Self-pay | Admitting: Pulmonary Disease

## 2013-04-14 DIAGNOSIS — G4733 Obstructive sleep apnea (adult) (pediatric): Secondary | ICD-10-CM

## 2013-04-14 NOTE — Telephone Encounter (Signed)
I spoke with pt. She stated the pressure is too strong (set 16 cm). She stated the air is blowing the mask off of his face, she is breathing in so much air that it's "putting air" in her stomach. She is asking if the pressure can be decreased. Please advise KC thanks

## 2013-04-14 NOTE — Telephone Encounter (Signed)
i spoke with pt and [name]. Order sent to [facility] thanks

## 2013-04-14 NOTE — Telephone Encounter (Signed)
Ok to decrease pressure to 14cm, and let us know if not going well.

## 2013-04-17 ENCOUNTER — Ambulatory Visit (HOSPITAL_COMMUNITY)
Admission: RE | Admit: 2013-04-17 | Discharge: 2013-04-17 | Disposition: A | Discharge status: Home / Self Care | Payer: BC Managed Care – PPO | Source: Ambulatory Visit | Attending: Family Medicine | Admitting: Family Medicine

## 2013-04-17 DIAGNOSIS — R1313 Dysphagia, pharyngeal phase: Secondary | ICD-10-CM | POA: Insufficient documentation

## 2013-04-17 DIAGNOSIS — R1310 Dysphagia, unspecified: Secondary | ICD-10-CM

## 2013-04-17 DIAGNOSIS — IMO0001 Reserved for inherently not codable concepts without codable children: Secondary | ICD-10-CM | POA: Insufficient documentation

## 2013-04-17 NOTE — Procedures (Signed)
Objective Swallowing Evaluation: Modified Barium Swallowing Study   Patient Details  Name: [name] MRN: [id number] Date of Birth: 1961/10/01  Today's Date: 04/17/2013 Time:  - 12:00 PM    Past Medical History:  Past Medical History  Diagnosis Date  . IBS (irritable bowel syndrome)     mixed  . GERD (gastroesophageal reflux disease)   . Allergic rhinitis   . Asthma   . Eczema   . Sleep apnea     cpap setting of 16, humidity setting 14f 4% sleep study 07-10-2010 epic  . Rheumatoid arthritis(714.0) 2010    oa and ra  . Nephrolithiasis     current kidney stones  . Plaque psoriasis   . Cerebral vascular malformation     sees dr Lovell Sheehan for monitoring as needed, sees dr lewitt for headaches every 4 months  . Lactose intolerance   . PONV (postoperative nausea and vomiting)     also itching   Past Surgical History:  Past Surgical History  Procedure Laterality Date  . Incontinence surgery  2010    sling done   . Knee arthroscopy  1992    left  . Foot surgery  1999    right ankle  . Kidney stone surgery  2001  . Appendectomy  1981  . Cholecystectomy  1981  . Tubal ligation  1990  . Total abdominal hysterectomy  2001  . Hernia repair  2006  . Left foot plating and scarping for arthritis  2011  . Right ear tube insertion  2011  . Incisional hernia repair  05/20/2012    Procedure: HERNIA REPAIR INCISIONAL;  Surgeon: Clovis Pu. Cornett, MD;  Location: [facility];  Service: General;  Laterality: N/A;   HPI:  [name] is an 52 y.o. female a history of irritable bowel syndrome, peripheral vascular disease, intracranial vascular malformations, rheumatoid arthritis and sleep apnea presenting with complaint of unstable gait and dizziness for starting on 03/25/2013, in addition to feeling generally weak. CT scan of her head showed equivocal right frontal abnormal low density area suggestive of stroke. MRI, however, showed findings consistent with right parietal acute ischemic  infarction. MRA showed no large vessel occlusion or significant stenosis. Both anterior cerebral arteries arose from the right ICA. Pt noticed swallowing problems shortly after being discharged home from her her acute hospital stay. She has been seen by [facility] SLP, Celedonio Miyamoto who recommended MBSS due to pt complaints of globus and constant throat clearing. She has been consuming a sefl regulated soft diet with no mixed consistencies.  Symptoms/Limitations Symptoms: Pt describes globus sensation when swallowing (with and without food), constant throat clearing. Special Tests: MBSS  Recommendation/Prognosis  Clinical Impression Dysphagia Diagnosis: Mild pharyngeal phase dysphagia Clinical impression: Swallow function is essentially WNL, however pt reports strong globus sensation. She whinces during some swallows and all swallows appear very effortful. Pt with frequent throat clearing. One episode of flash penetration occurred before/during the swallow when pt tried to swallow barium tablet with thin (thin penetration without aspiration). She was unable to transfer barium tablet over base of tongue with thin or puree.  Question some sort of excess tissue  in vallecular space and recommend ENT consult to rule out pathology. SLP attempted to better visualize anatomy with elevated head turn to left and right, however unable to see. Pt did find it much more difficult to turn her head over left shoulder and swallow.  Pt has seen Dr. Suszanne Conners in the past and requested that  I send a copy of this report to him as well as PCP. Swallow Evaluation Recommendations Recommended Consults: Consider ENT evaluation Diet Recommendations: Dysphagia 3 (Mechanical Soft);Thin liquid (Pt able to self regulate regular/soft textures to her prefer) Liquid Administration via: Cup;Straw Medication Administration: Whole meds with liquid Supervision: Patient able to self feed Postural Changes and/or Swallow Maneuvers: Seated  upright 90 degrees;Upright 30-60 min after meal Oral Care Recommendations: Oral care BID Follow up Recommendations: Home health SLP Prognosis Prognosis for Safe Diet Advancement: Good Individuals Consulted Consulted and Agree with Results and Recommendations: Patient;Family member/caregiver Family Member Consulted: Husband Report Sent to : Referring physician;Other (comment) (ENT, Dr. Suszanne Conners)  SLP Assessment/Plan Dysphagia Diagnosis: Mild pharyngeal phase dysphagia Clinical impression: Swallow function is essentially WNL, however pt reports strong globus sensation. She whinces during some swallows and all swallows appear very effortful. Pt with frequent throat clearing. One episode of flash penetration occurred before/during the swallow when pt tried to swallow barium tablet with thin (thin penetration without aspiration). She was unable to transfer barium tablet over base of tongue with thin or puree.  Question some sort of excess tissue  in vallecular space and recommend ENT consult to rule out pathology. SLP attempted to better visualize anatomy with elevated head turn to left and right, however unable to see. Pt did find it much more difficult to turn her head over left shoulder and swallow.  Pt has seen Dr. Suszanne Conners in the past and requested that I send a copy of this report to him as well as PCP.  General:  Date of Onset: 03/25/13 HPI: [name] is an 52 y.o. female a history of irritable bowel syndrome, peripheral vascular disease, intracranial vascular malformations, rheumatoid arthritis and sleep apnea presenting with complaint of unstable gait and dizziness for starting on 03/25/2013, in addition to feeling generally weak. CT scan of her head showed equivocal right frontal abnormal low density area suggestive of stroke. MRI, however, showed findings consistent with right parietal acute ischemic infarction. MRA showed no large vessel occlusion or significant stenosis. Both anterior cerebral  arteries arose from the right ICA. Pt noticed swallowing problems shortly after being discharged home from her her acute hospital stay. She has been seen by [facility] SLP, Celedonio Miyamoto who recommended MBSS due to pt complaints of globus and constant throat clearing. She has been consuming a sefl regulated soft diet with no mixed consistencies. Type of Study: Modified Barium Swallowing Study Reason for Referral: Objectively evaluate swallowing function Previous Swallow Assessment: None on record Diet Prior to this Study: Dysphagia 3 (soft);Thin liquids Temperature Spikes Noted: No Respiratory Status: Room air History of Recent Intubation: No Behavior/Cognition: Pleasant mood;Cooperative;Alert Oral Cavity - Dentition: Adequate natural dentition Self-Feeding Abilities: Able to feed self Patient Positioning: Upright in chair Baseline Vocal Quality: Clear Volitional Cough: Strong Volitional Swallow: Able to elicit Anatomy: Within functional limits Pharyngeal Secretions: Not observed secondary MBS  Reason for Referral:  Objectively evaluate swallowing function   Oral Phase Oral Preparation/Oral Phase Oral Phase: Impaired Oral - Solids Oral - Pill: Reduced posterior propulsion (Pt unable to propel pill with liquid or puree to pharynx) Oral Phase - Comment Oral Phase - Comment: Essentially WFL, however pt unable to manipulate pill with liquids or puree to transfer over base of tongue- suspect this may related to fear of choking. She typically does not have trouble with pills at home per pt. Pharyngeal Phase  Pharyngeal Phase Pharyngeal Phase: Impaired Pharyngeal - Solids Pharyngeal - Multi-consistency: Premature spillage  to pyriform sinuses;Penetration/Aspiration during swallow (Pt penetrated thin liquid when trying to swallow pill) Penetration/Aspiration details (multi-consistency): Material enters airway, remains ABOVE vocal cords then ejected out;Material does not enter  airway Pharyngeal Phase - Comment Pharyngeal Comment: Essentially WFL- very trace residuals in valleculae/coating from tongue base which pt swallows immediately after primary swallow. Cervical Esophageal Phase  Cervical Esophageal Phase Cervical Esophageal Phase: WFL (Esophageal sweep reveals mild decreased motility)  Thank you,  Havery Moros, CCC-SLP [phone]  Giordano Getman 04/17/2013, 3:22 PM

## 2013-05-02 ENCOUNTER — Encounter: Payer: Self-pay | Admitting: Neurology

## 2013-05-02 ENCOUNTER — Ambulatory Visit: Payer: Self-pay

## 2013-05-02 ENCOUNTER — Ambulatory Visit (INDEPENDENT_AMBULATORY_CARE_PROVIDER_SITE_OTHER): Payer: BC Managed Care – PPO | Admitting: Neurology

## 2013-05-02 ENCOUNTER — Ambulatory Visit (INDEPENDENT_AMBULATORY_CARE_PROVIDER_SITE_OTHER): Payer: BC Managed Care – PPO

## 2013-05-02 VITALS — BP 132/77 | HR 44 | Ht 66.0 in | Wt 154.0 lb

## 2013-05-02 DIAGNOSIS — I4891 Unspecified atrial fibrillation: Secondary | ICD-10-CM

## 2013-05-02 DIAGNOSIS — R51 Headache: Secondary | ICD-10-CM

## 2013-05-02 DIAGNOSIS — I6350 Cerebral infarction due to unspecified occlusion or stenosis of unspecified cerebral artery: Secondary | ICD-10-CM

## 2013-05-02 DIAGNOSIS — Z0289 Encounter for other administrative examinations: Secondary | ICD-10-CM

## 2013-05-02 DIAGNOSIS — Q211 Atrial septal defect: Secondary | ICD-10-CM

## 2013-05-02 MED ORDER — TOPIRAMATE 50 MG PO TABS: 50.0000 mg | ORAL_TABLET | Freq: Two times a day (BID) | ORAL | Status: DC

## 2013-05-02 NOTE — Patient Instructions (Signed)
Continue aspirin for stroke prevention and will check outpatient TEE and prolonged cardiac event monitor for paroxysmal atrial fibrillation. I discussed with the patient possible participation in the [place] PFO closure trial and she will call me if interested. Return for followup in 2 months.

## 2013-05-02 NOTE — Progress Notes (Signed)
[facility] [address] [place]. [place] [address] [phone]       OFFICE FOLLOW-UP NOTE  [name] Date of Birth:  04/04/1961 Medical Record Number:  [id number]   HPI: 52-year-old Caucasian lady being seen today for the first office followup visit for hospital admission for stroke. She presented with sudden onset of unstable gait and dizziness on 03/25/13. CT scan of the head showed equal local right frontal low-density and subsequently MRI scan of the brain confirmed right parietal acute ischemic infarct. MRA of the brain showed diffuse mild intracranial atherosclerotic changes without any large vessel occlusion. Lipid profile and hemoglobin A1c were normal. MRA of the brain showed a tiny incidental 1.2 mm right cavernous internal carotid artery aneurysm. Transthoracic echo showed normal ejection fraction. Carotid ultrasounds were unremarkable. She was not found to have significant vascular risk factors. Except for sleep apnea for which she does use CPAP mask every night. Extensive lab work done in the hospital included basic metabolic panel, ANA, ESR, complement levels, RPR, hypercoagulable panel all of which were negative. She states that she's been having headaches which  Are almost daily since discharge. She has been taking tramadol which helps but only for short while. She also complains of some intermittent dizziness and some left-sided incoordination which is improving but is not back to normal. ROS:   14 system review of systems is positive for headache, memory loss, weakness, snoring, slurred speech, decreased energy chest pain, hearing loss, ringing in the ears, trouble swallowing, cough, snoring, constipation, joint pain, aching muscles  PMH:  Past Medical History  Diagnosis Date  . IBS (irritable bowel syndrome)     mixed  . GERD (gastroesophageal reflux disease)   . Allergic rhinitis   . Asthma   . Eczema   . Sleep apnea     cpap setting of 16,  humidity setting 0f 4% sleep study 07-10-2010 epic  . Rheumatoid arthritis(714.0) 2010    oa and ra  . Nephrolithiasis     current kidney stones  . Plaque psoriasis   . Cerebral vascular malformation     sees dr jenkins for monitoring as needed, sees dr lewitt for headaches every 4 months  . Lactose intolerance   . PONV (postoperative nausea and vomiting)     also itching    Social History:  History   Social History  . Marital Status: Married    Spouse Name: N/A    Number of Children: N/A  . Years of Education: N/A   Occupational History  . Cafe manager     [employer]    Social History Main Topics  . Smoking status: Former Smoker -- 2.00 packs/day for 30 years    Types: Cigarettes    Quit date: 10/16/2000  . Smokeless tobacco: Never Used  . Alcohol Use: Yes     Comment: rare  . Drug Use: No  . Sexually Active: Not on file   Other Topics Concern  . Not on file   Social History Narrative  . No narrative on file    Medications:   Current Outpatient Prescriptions on File Prior to Visit  Medication Sig Dispense Refill  . ALPRAZolam (XANAX) 0.5 MG tablet Take 0.5 mg by mouth 2 (two) times daily.       . aspirin 81 MG chewable tablet Chew 1 tablet (81 mg total) by mouth daily.  30 tablet  0  . clobetasol ointment (TEMOVATE) 0.05 % Apply 1 application topically 2 (two)   times daily.       . folic acid (FOLVITE) 1 MG tablet Take 1 mg by mouth daily.      . gabapentin (NEURONTIN) 300 MG capsule Take 600 mg by mouth 2 (two) times daily.       . InFLIXimab (REMICADE IV) Inject 3 mg/kg into the vein every 8 (eight) weeks.      . Multiple Vitamin (MULTIVITAMIN) capsule Take 1 capsule by mouth daily.       . NON FORMULARY Place 1 spray into the nose 2 (two) times daily as needed. Lidocaine Spray.  Use as directed for headaches.      . ondansetron (ZOFRAN) 4 MG tablet Take 1 tablet (4 mg total) by mouth every 8 (eight) hours as needed for nausea.  20 tablet  0  .  pantoprazole (PROTONIX) 40 MG tablet Take 1 tablet (40 mg total) by mouth daily.  30 tablet  3  . PRESCRIPTION MEDICATION Inject 10 mg into the muscle every Friday. Methotrexate 25 mg/ml.  10 mg = 0.4 ml      . traMADol (ULTRAM) 50 MG tablet Take 50-100 mg by mouth 2 (two) times daily as needed. For pain      . VENTOLIN HFA 108 (90 BASE) MCG/ACT inhaler Inhale 2 puffs into the lungs every 6 (six) hours as needed.       . verapamil (CALAN) 80 MG tablet Take 40 mg by mouth at bedtime. Takes 1/2 of 80 mg tab at hs       No current facility-administered medications on file prior to visit.    Allergies:   Allergies  Allergen Reactions  . Amoxicillin     REACTION: GI upset  . Aspirin     REACTION: severe gi upset  . Ciprofloxacin     REACTION: angioedema, urticaria  . Cosyntropin     REACTION: swelling, hives  . Guaifenesin     REACTION: head rash  . Moxifloxacin Other (See Comments)    gi upset  . Nsaids Other (See Comments)    Gi upset  . Sulfonamide Derivatives     REACTION: swelling, tongue and hives   Filed Vitals:   05/02/13 1426  BP: 132/77  Pulse: 44    Physical Exam General: well developed, well nourished, seated, in no evident distress Head: head normocephalic and atraumatic. Orohparynx benign Neck: supple with no carotid or supraclavicular bruits Cardiovascular: regular rate and rhythm, no murmurs Musculoskeletal: no deformity Skin:  no rash/petichiae Vascular:  Normal pulses all extremities  Neurologic Exam Mental Status: Awake and fully alert. Oriented to place and time. Recent and remote memory intact. Attention span, concentration and fund of knowledge appropriate. Mood and affect appropriate.  Cranial Nerves: Fundoscopic exam reveals sharp disc margins. Pupils equal, briskly reactive to light. Extraocular movements full without nystagmus. Visual fields full to confrontation. Hearing intact. Facial sensation intact. Face, tongue, palate moves normally and  symmetrically.  Motor: Normal bulk and tone. Normal strength in all tested extremity muscles.diminished fine finger movements on left and orbits right over left upper extremity. Sensory.: intact to tough and pinprick and vibratory.  Coordination: Rapid alternating movements normal in all extremities. Finger-to-nose and heel-to-shin performed accurately bilaterally. Gait and Station: Arises from chair without difficulty. Stance is normal. Gait demonstrates normal stride length and balance . Able to heel, toe and tandem walk without difficulty.  Reflexes: 1+ and symmetric. Toes downgoing.     ASSESSMENT: Ms. Kamylle A Felber is a 52 y.o. female presenting with   dizziness, weakness and headache. In June 2014 from   Acute non hemorrhagic small to moderate size right parietal lobe infarct  felt to be embolic secondary to unknown source. No significant vascular risk factors except sleep apnea and mild obesity. New headaches post stroke  likely mixed migraine and tension headaches.    PLAN: Continue aspirin for stroke prevention and will check outpatient TEE and prolonged cardiac event monitor for paroxysmal atrial fibrillation. Trial of Topamax for headaches begin 50 mg at night for one week increase if tolerated to twice daily. Continue to use tramadol as needed but limit to not more than 3 days a week to avoid rebound. I discussed with the patient possible participation in the [place] PFO closure trial and she will call me if interested. Return for followup in 2 months.       [facility]      [address]      [place]. [place] [address] [phone]       TRANSCRANIAL DOPPLER BUBBLE STUDY   Ms. Lindzey A Swayze Date of Birth:  03/16/1961 Medical Record Number:  [id number]   Indications: Diagnostic Date of Procedure: 05/01/13 Clinical History:  52 lady with stroke Technical Description:   Transcranial Doppler Bubble Study was performed at the bedside after taking written informed  consent from the patient and explaining risk/benefits. Both middle cerebral arteries were insonated using a headset. And IV line was inserted in the right forearm by the RN using aseptic precautions. Agitated saline injection at rest and after valsalva maneuver did  result in high intensity transient signals (HITS) 12 seconds after the injection and increased with valasalva.   Impression:  Positive  Transcranial Doppler Bubble Study indicative of moderate size right to left intracardiac or intrapulmonary shunt.   Results were explained to the patient. Questions were answered. Plan to do TEE to confirm source of shunt.     Nuel Dejaynes, MD 

## 2013-05-15 ENCOUNTER — Encounter (HOSPITAL_COMMUNITY)
Admission: RE | Disposition: A | Discharge status: Home / Self Care | Payer: Self-pay | Source: Ambulatory Visit | Attending: Cardiology

## 2013-05-15 ENCOUNTER — Encounter (HOSPITAL_COMMUNITY): Payer: Self-pay | Admitting: Gastroenterology

## 2013-05-15 ENCOUNTER — Ambulatory Visit (HOSPITAL_COMMUNITY)
Admission: RE | Admit: 2013-05-15 | Discharge: 2013-05-15 | Disposition: A | Discharge status: Home / Self Care | Payer: BC Managed Care – PPO | Source: Ambulatory Visit | Attending: Cardiology | Admitting: Cardiology

## 2013-05-15 DIAGNOSIS — K5900 Constipation, unspecified: Secondary | ICD-10-CM | POA: Insufficient documentation

## 2013-05-15 DIAGNOSIS — E739 Lactose intolerance, unspecified: Secondary | ICD-10-CM | POA: Insufficient documentation

## 2013-05-15 DIAGNOSIS — L408 Other psoriasis: Secondary | ICD-10-CM | POA: Insufficient documentation

## 2013-05-15 DIAGNOSIS — Q2111 Secundum atrial septal defect: Secondary | ICD-10-CM | POA: Insufficient documentation

## 2013-05-15 DIAGNOSIS — I639 Cerebral infarction, unspecified: Secondary | ICD-10-CM

## 2013-05-15 DIAGNOSIS — M1990 Unspecified osteoarthritis, unspecified site: Secondary | ICD-10-CM | POA: Insufficient documentation

## 2013-05-15 DIAGNOSIS — K589 Irritable bowel syndrome without diarrhea: Secondary | ICD-10-CM | POA: Insufficient documentation

## 2013-05-15 DIAGNOSIS — H9190 Unspecified hearing loss, unspecified ear: Secondary | ICD-10-CM | POA: Insufficient documentation

## 2013-05-15 DIAGNOSIS — H9319 Tinnitus, unspecified ear: Secondary | ICD-10-CM | POA: Insufficient documentation

## 2013-05-15 DIAGNOSIS — J45909 Unspecified asthma, uncomplicated: Secondary | ICD-10-CM | POA: Insufficient documentation

## 2013-05-15 DIAGNOSIS — L259 Unspecified contact dermatitis, unspecified cause: Secondary | ICD-10-CM | POA: Insufficient documentation

## 2013-05-15 DIAGNOSIS — M069 Rheumatoid arthritis, unspecified: Secondary | ICD-10-CM | POA: Insufficient documentation

## 2013-05-15 DIAGNOSIS — G4730 Sleep apnea, unspecified: Secondary | ICD-10-CM | POA: Insufficient documentation

## 2013-05-15 DIAGNOSIS — K219 Gastro-esophageal reflux disease without esophagitis: Secondary | ICD-10-CM | POA: Insufficient documentation

## 2013-05-15 DIAGNOSIS — I671 Cerebral aneurysm, nonruptured: Secondary | ICD-10-CM | POA: Insufficient documentation

## 2013-05-15 DIAGNOSIS — Q211 Atrial septal defect: Secondary | ICD-10-CM | POA: Insufficient documentation

## 2013-05-15 HISTORY — DX: Major depressive disorder, single episode, unspecified: F32.9

## 2013-05-15 HISTORY — DX: Cerebral infarction, unspecified: I63.9

## 2013-05-15 HISTORY — DX: Depression, unspecified: F32.A

## 2013-05-15 HISTORY — DX: Anxiety disorder, unspecified: F41.9

## 2013-05-15 HISTORY — PX: TEE WITHOUT CARDIOVERSION: SHX5443

## 2013-05-15 SURGERY — ECHOCARDIOGRAM, TRANSESOPHAGEAL
Anesthesia: Moderate Sedation

## 2013-05-15 MED ORDER — BUTAMBEN-TETRACAINE-BENZOCAINE 2-2-14 % EX AERO
INHALATION_SPRAY | CUTANEOUS | Status: DC | PRN
  Administered 2013-05-15: 2 via TOPICAL

## 2013-05-15 MED ORDER — FENTANYL CITRATE 0.05 MG/ML IJ SOLN
INTRAMUSCULAR | Status: AC
  Filled 2013-05-15: qty 2

## 2013-05-15 MED ORDER — SODIUM CHLORIDE 0.9 % IV SOLN
INTRAVENOUS | Status: DC
  Administered 2013-05-15: 500 mL via INTRAVENOUS

## 2013-05-15 MED ORDER — FENTANYL CITRATE 0.05 MG/ML IJ SOLN
INTRAMUSCULAR | Status: DC | PRN
  Administered 2013-05-15 (×2): 25 ug via INTRAVENOUS

## 2013-05-15 MED ORDER — MIDAZOLAM HCL 10 MG/2ML IJ SOLN
INTRAMUSCULAR | Status: DC | PRN
  Administered 2013-05-15 (×2): 2 mg via INTRAVENOUS
  Administered 2013-05-15: 1 mg via INTRAVENOUS

## 2013-05-15 MED ORDER — MIDAZOLAM HCL 5 MG/ML IJ SOLN
INTRAMUSCULAR | Status: AC
  Filled 2013-05-15: qty 2

## 2013-05-15 NOTE — Progress Notes (Signed)
Echocardiogram Echocardiogram Transesophageal has been performed.  [name] 05/15/2013, 8:53 AM

## 2013-05-15 NOTE — CV Procedure (Signed)
Large PFO with strongly positive double contrast for bidirectional shunting both at rest and Valsalva. Marland Kitchen PFO measures 5mm and tunnel length 15 mm. Otherwise normal TEE.

## 2013-05-15 NOTE — Interval H&P Note (Signed)
History and Physical Interval Note:  05/15/2013 8:15 AM  [name]  has presented today for surgery, with the diagnosis of PFO   The various methods of treatment have been discussed with the patient and family. After consideration of risks, benefits and other options for treatment, the patient has consented to  Procedure(s): TRANSESOPHAGEAL ECHOCARDIOGRAM (TEE) (N/A) as a surgical intervention .  The patient's history has been reviewed, patient examined, no change in status, stable for surgery.  I have reviewed the patient's chart and labs.  Questions were answered to the patient's satisfaction.     Pamella Pert

## 2013-05-15 NOTE — H&P (View-Only) (Signed)
[facility] [address] [place]. [place] [address] [phone]       OFFICE FOLLOW-UP NOTE  [name] Date of Birth:  [birth year] Medical Record Number:  [id number]   HPI: 52 year old Caucasian lady being seen today for the first office followup visit for hospital admission for stroke. She presented with sudden onset of unstable gait and dizziness on 03/25/13. CT scan of the head showed equal local right frontal low-density and subsequently MRI scan of the brain confirmed right parietal acute ischemic infarct. MRA of the brain showed diffuse mild intracranial atherosclerotic changes without any large vessel occlusion. Lipid profile and hemoglobin A1c were normal. MRA of the brain showed a tiny incidental 1.2 mm right cavernous internal carotid artery aneurysm. Transthoracic echo showed normal ejection fraction. Carotid ultrasounds were unremarkable. She was not found to have significant vascular risk factors. Except for sleep apnea for which she does use CPAP mask every night. Extensive lab work done in the hospital included basic metabolic panel, ANA, ESR, complement levels, RPR, hypercoagulable panel all of which were negative. She states that she's been having headaches which  Are almost daily since discharge. She has been taking tramadol which helps but only for short while. She also complains of some intermittent dizziness and some left-sided incoordination which is improving but is not back to normal. ROS:   14 system review of systems is positive for headache, memory loss, weakness, snoring, slurred speech, decreased energy chest pain, hearing loss, ringing in the ears, trouble swallowing, cough, snoring, constipation, joint pain, aching muscles  PMH:  Past Medical History  Diagnosis Date  . IBS (irritable bowel syndrome)     mixed  . GERD (gastroesophageal reflux disease)   . Allergic rhinitis   . Asthma   . Eczema   . Sleep apnea     cpap setting of 16,  humidity setting 31f 4% sleep study 07-10-2010 epic  . Rheumatoid arthritis(714.0) 2010    oa and ra  . Nephrolithiasis     current kidney stones  . Plaque psoriasis   . Cerebral vascular malformation     sees dr Lovell Sheehan for monitoring as needed, sees dr lewitt for headaches every 4 months  . Lactose intolerance   . PONV (postoperative nausea and vomiting)     also itching    Social History:  History   Social History  . Marital Status: Married    Spouse Name: N/A    Number of Children: N/A  . Years of Education: N/A   Occupational History  . Cafe manager     [employer]    Social History Main Topics  . Smoking status: Former Smoker -- 2.00 packs/day for 30 years    Types: Cigarettes    Quit date: 10/16/2000  . Smokeless tobacco: Never Used  . Alcohol Use: Yes     Comment: rare  . Drug Use: No  . Sexually Active: Not on file   Other Topics Concern  . Not on file   Social History Narrative  . No narrative on file    Medications:   Current Outpatient Prescriptions on File Prior to Visit  Medication Sig Dispense Refill  . ALPRAZolam (XANAX) 0.5 MG tablet Take 0.5 mg by mouth 2 (two) times daily.       Marland Kitchen aspirin 81 MG chewable tablet Chew 1 tablet (81 mg total) by mouth daily.  30 tablet  0  . clobetasol ointment (TEMOVATE) 0.05 % Apply 1 application topically 2 (two)  times daily.       . folic acid (FOLVITE) 1 MG tablet Take 1 mg by mouth daily.      Marland Kitchen gabapentin (NEURONTIN) 300 MG capsule Take 600 mg by mouth 2 (two) times daily.       . InFLIXimab (REMICADE IV) Inject 3 mg/kg into the vein every 8 (eight) weeks.      . Multiple Vitamin (MULTIVITAMIN) capsule Take 1 capsule by mouth daily.       . NON FORMULARY Place 1 spray into the nose 2 (two) times daily as needed. Lidocaine Spray.  Use as directed for headaches.      . ondansetron (ZOFRAN) 4 MG tablet Take 1 tablet (4 mg total) by mouth every 8 (eight) hours as needed for nausea.  20 tablet  0  .  pantoprazole (PROTONIX) 40 MG tablet Take 1 tablet (40 mg total) by mouth daily.  30 tablet  3  . PRESCRIPTION MEDICATION Inject 10 mg into the muscle every Friday. Methotrexate 25 mg/ml.  10 mg = 0.4 ml      . traMADol (ULTRAM) 50 MG tablet Take 50-100 mg by mouth 2 (two) times daily as needed. For pain      . VENTOLIN HFA 108 (90 BASE) MCG/ACT inhaler Inhale 2 puffs into the lungs every 6 (six) hours as needed.       . verapamil (CALAN) 80 MG tablet Take 40 mg by mouth at bedtime. Takes 1/2 of 80 mg tab at hs       No current facility-administered medications on file prior to visit.    Allergies:   Allergies  Allergen Reactions  . Amoxicillin     REACTION: GI upset  . Aspirin     REACTION: severe gi upset  . Ciprofloxacin     REACTION: angioedema, urticaria  . Cosyntropin     REACTION: swelling, hives  . Guaifenesin     REACTION: head rash  . Moxifloxacin Other (See Comments)    gi upset  . Nsaids Other (See Comments)    Gi upset  . Sulfonamide Derivatives     REACTION: swelling, tongue and hives   Filed Vitals:   05/02/13 1426  BP: 132/77  Pulse: 44    Physical Exam General: well developed, well nourished, seated, in no evident distress Head: head normocephalic and atraumatic. Orohparynx benign Neck: supple with no carotid or supraclavicular bruits Cardiovascular: regular rate and rhythm, no murmurs Musculoskeletal: no deformity Skin:  no rash/petichiae Vascular:  Normal pulses all extremities  Neurologic Exam Mental Status: Awake and fully alert. Oriented to place and time. Recent and remote memory intact. Attention span, concentration and fund of knowledge appropriate. Mood and affect appropriate.  Cranial Nerves: Fundoscopic exam reveals sharp disc margins. Pupils equal, briskly reactive to light. Extraocular movements full without nystagmus. Visual fields full to confrontation. Hearing intact. Facial sensation intact. Face, tongue, palate moves normally and  symmetrically.  Motor: Normal bulk and tone. Normal strength in all tested extremity muscles.diminished fine finger movements on left and orbits right over left upper extremity. Sensory.: intact to tough and pinprick and vibratory.  Coordination: Rapid alternating movements normal in all extremities. Finger-to-nose and heel-to-shin performed accurately bilaterally. Gait and Station: Arises from chair without difficulty. Stance is normal. Gait demonstrates normal stride length and balance . Able to heel, toe and tandem walk without difficulty.  Reflexes: 1+ and symmetric. Toes downgoing.     ASSESSMENT: [name] is a 52 y.o. female presenting with  dizziness, weakness and headache. In June 2014 from   Acute non hemorrhagic small to moderate size right parietal lobe infarct  felt to be embolic secondary to unknown source. No significant vascular risk factors except sleep apnea and mild obesity. New headaches post stroke  likely mixed migraine and tension headaches.    PLAN: Continue aspirin for stroke prevention and will check outpatient TEE and prolonged cardiac event monitor for paroxysmal atrial fibrillation. Trial of Topamax for headaches begin 50 mg at night for one week increase if tolerated to twice daily. Continue to use tramadol as needed but limit to not more than 3 days a week to avoid rebound. I discussed with the patient possible participation in the [place] PFO closure trial and she will call me if interested. Return for followup in 2 months.       [facility]      [address]      [place]. [place] [address] [phone]       TRANSCRANIAL DOPPLER BUBBLE STUDY   Ms. ROSHAWNA COLCLASURE Date of Birth:  [birth year] Medical Record Number:  [id number]   Indications: Diagnostic Date of Procedure: 05/01/13 Clinical History:  16 lady with stroke Technical Description:   Transcranial Doppler Bubble Study was performed at the bedside after taking written informed  consent from the patient and explaining risk/benefits. Both middle cerebral arteries were insonated using a headset. And IV line was inserted in the right forearm by the RN using aseptic precautions. Agitated saline injection at rest and after valsalva maneuver did  result in high intensity transient signals (HITS) 12 seconds after the injection and increased with valasalva.   Impression:  Positive  Transcranial Doppler Bubble Study indicative of moderate size right to left intracardiac or intrapulmonary shunt.   Results were explained to the patient. Questions were answered. Plan to do TEE to confirm source of shunt.     Delia Heady, MD

## 2013-05-16 ENCOUNTER — Encounter (HOSPITAL_COMMUNITY): Payer: Self-pay | Admitting: Cardiology

## 2013-05-27 ENCOUNTER — Encounter: Payer: Self-pay | Admitting: *Deleted

## 2013-05-27 DIAGNOSIS — Z0289 Encounter for other administrative examinations: Secondary | ICD-10-CM

## 2013-05-29 ENCOUNTER — Encounter (HOSPITAL_COMMUNITY): Payer: Self-pay | Admitting: Emergency Medicine

## 2013-05-29 ENCOUNTER — Emergency Department (HOSPITAL_COMMUNITY): Payer: BC Managed Care – PPO

## 2013-05-29 ENCOUNTER — Observation Stay (HOSPITAL_COMMUNITY)
Admission: EM | Admit: 2013-05-29 | Discharge: 2013-05-30 | Disposition: A | Discharge status: Home / Self Care | Payer: BC Managed Care – PPO | Attending: Internal Medicine | Admitting: Internal Medicine

## 2013-05-29 DIAGNOSIS — M25469 Effusion, unspecified knee: Secondary | ICD-10-CM

## 2013-05-29 DIAGNOSIS — R404 Transient alteration of awareness: Secondary | ICD-10-CM | POA: Insufficient documentation

## 2013-05-29 DIAGNOSIS — K432 Incisional hernia without obstruction or gangrene: Secondary | ICD-10-CM

## 2013-05-29 DIAGNOSIS — R197 Diarrhea, unspecified: Secondary | ICD-10-CM

## 2013-05-29 DIAGNOSIS — Q211 Atrial septal defect: Secondary | ICD-10-CM | POA: Insufficient documentation

## 2013-05-29 DIAGNOSIS — G4733 Obstructive sleep apnea (adult) (pediatric): Secondary | ICD-10-CM

## 2013-05-29 DIAGNOSIS — IMO0002 Reserved for concepts with insufficient information to code with codable children: Secondary | ICD-10-CM

## 2013-05-29 DIAGNOSIS — D72829 Elevated white blood cell count, unspecified: Secondary | ICD-10-CM

## 2013-05-29 DIAGNOSIS — R079 Chest pain, unspecified: Secondary | ICD-10-CM

## 2013-05-29 DIAGNOSIS — R112 Nausea with vomiting, unspecified: Secondary | ICD-10-CM

## 2013-05-29 DIAGNOSIS — R569 Unspecified convulsions: Secondary | ICD-10-CM

## 2013-05-29 DIAGNOSIS — L259 Unspecified contact dermatitis, unspecified cause: Secondary | ICD-10-CM

## 2013-05-29 DIAGNOSIS — M1990 Unspecified osteoarthritis, unspecified site: Secondary | ICD-10-CM

## 2013-05-29 DIAGNOSIS — R1310 Dysphagia, unspecified: Secondary | ICD-10-CM

## 2013-05-29 DIAGNOSIS — K219 Gastro-esophageal reflux disease without esophagitis: Secondary | ICD-10-CM | POA: Insufficient documentation

## 2013-05-29 DIAGNOSIS — I639 Cerebral infarction, unspecified: Secondary | ICD-10-CM | POA: Diagnosis present

## 2013-05-29 DIAGNOSIS — G2581 Restless legs syndrome: Secondary | ICD-10-CM

## 2013-05-29 DIAGNOSIS — R51 Headache: Principal | ICD-10-CM | POA: Insufficient documentation

## 2013-05-29 DIAGNOSIS — R634 Abnormal weight loss: Secondary | ICD-10-CM

## 2013-05-29 DIAGNOSIS — E876 Hypokalemia: Secondary | ICD-10-CM

## 2013-05-29 DIAGNOSIS — R519 Headache, unspecified: Secondary | ICD-10-CM | POA: Diagnosis present

## 2013-05-29 DIAGNOSIS — Q2111 Secundum atrial septal defect: Secondary | ICD-10-CM | POA: Insufficient documentation

## 2013-05-29 DIAGNOSIS — R9431 Abnormal electrocardiogram [ECG] [EKG]: Secondary | ICD-10-CM

## 2013-05-29 DIAGNOSIS — R4182 Altered mental status, unspecified: Secondary | ICD-10-CM

## 2013-05-29 DIAGNOSIS — Z8673 Personal history of transient ischemic attack (TIA), and cerebral infarction without residual deficits: Secondary | ICD-10-CM | POA: Insufficient documentation

## 2013-05-29 DIAGNOSIS — M1710 Unilateral primary osteoarthritis, unspecified knee: Secondary | ICD-10-CM

## 2013-05-29 DIAGNOSIS — K589 Irritable bowel syndrome without diarrhea: Secondary | ICD-10-CM

## 2013-05-29 DIAGNOSIS — Z79899 Other long term (current) drug therapy: Secondary | ICD-10-CM | POA: Insufficient documentation

## 2013-05-29 DIAGNOSIS — Q2112 Patent foramen ovale: Secondary | ICD-10-CM

## 2013-05-29 DIAGNOSIS — R413 Other amnesia: Secondary | ICD-10-CM | POA: Insufficient documentation

## 2013-05-29 DIAGNOSIS — J209 Acute bronchitis, unspecified: Secondary | ICD-10-CM

## 2013-05-29 DIAGNOSIS — R109 Unspecified abdominal pain: Secondary | ICD-10-CM

## 2013-05-29 DIAGNOSIS — M23302 Other meniscus derangements, unspecified lateral meniscus, unspecified knee: Secondary | ICD-10-CM

## 2013-05-29 DIAGNOSIS — J45909 Unspecified asthma, uncomplicated: Secondary | ICD-10-CM

## 2013-05-29 LAB — BASIC METABOLIC PANEL
BUN: 15 mg/dL (ref 6–23)
CO2: 22 mEq/L (ref 19–32)
Chloride: 110 mEq/L (ref 96–112)
Creatinine, Ser: 0.79 mg/dL (ref 0.50–1.10)
Potassium: 3.7 mEq/L (ref 3.5–5.1)

## 2013-05-29 LAB — URINALYSIS, ROUTINE W REFLEX MICROSCOPIC
Bilirubin Urine: NEGATIVE
Glucose, UA: NEGATIVE mg/dL
Ketones, ur: NEGATIVE mg/dL
Nitrite: NEGATIVE
Specific Gravity, Urine: 1.019 (ref 1.005–1.030)
pH: 7.5 (ref 5.0–8.0)

## 2013-05-29 LAB — CBC WITH DIFFERENTIAL/PLATELET
Eosinophils Absolute: 0.1 10*3/uL (ref 0.0–0.7)
Eosinophils Relative: 1 % (ref 0–5)
HCT: 37.1 % (ref 36.0–46.0)
Hemoglobin: 13.4 g/dL (ref 12.0–15.0)
Lymphocytes Relative: 30 % (ref 12–46)
Lymphs Abs: 2.8 10*3/uL (ref 0.7–4.0)
MCH: 33.4 pg (ref 26.0–34.0)
MCV: 92.5 fL (ref 78.0–100.0)
Monocytes Relative: 9 % (ref 3–12)
RBC: 4.01 MIL/uL (ref 3.87–5.11)

## 2013-05-29 LAB — URINE MICROSCOPIC-ADD ON

## 2013-05-29 LAB — POCT I-STAT TROPONIN I: Troponin i, poc: 0 ng/mL (ref 0.00–0.08)

## 2013-05-29 MED ORDER — SODIUM CHLORIDE 0.9 % IJ SOLN: 3.0000 mL | INTRAMUSCULAR | Status: DC | PRN

## 2013-05-29 MED ORDER — GABAPENTIN 300 MG PO CAPS
600.0000 mg | ORAL_CAPSULE | Freq: Two times a day (BID) | ORAL | Status: DC
  Administered 2013-05-30 (×2): 600 mg via ORAL
  Filled 2013-05-29 (×3): qty 2

## 2013-05-29 MED ORDER — ACETAMINOPHEN 325 MG PO TABS: 650.0000 mg | ORAL_TABLET | Freq: Four times a day (QID) | ORAL | Status: DC | PRN

## 2013-05-29 MED ORDER — SODIUM CHLORIDE 0.9 % IV BOLUS (SEPSIS)
1000.0000 mL | Freq: Once | INTRAVENOUS | Status: AC
  Administered 2013-05-29: 1000 mL via INTRAVENOUS

## 2013-05-29 MED ORDER — ONDANSETRON HCL 4 MG PO TABS: 4.0000 mg | ORAL_TABLET | Freq: Four times a day (QID) | ORAL | Status: DC | PRN

## 2013-05-29 MED ORDER — PANTOPRAZOLE SODIUM 40 MG PO TBEC
40.0000 mg | DELAYED_RELEASE_TABLET | Freq: Every day | ORAL | Status: DC
  Administered 2013-05-30: 40 mg via ORAL

## 2013-05-29 MED ORDER — ASPIRIN 81 MG PO CHEW
81.0000 mg | CHEWABLE_TABLET | Freq: Every day | ORAL | Status: DC
  Administered 2013-05-30: 81 mg via ORAL
  Filled 2013-05-29: qty 1

## 2013-05-29 MED ORDER — ONDANSETRON HCL 4 MG/2ML IJ SOLN: 4.0000 mg | Freq: Four times a day (QID) | INTRAMUSCULAR | Status: DC | PRN

## 2013-05-29 MED ORDER — ACETAMINOPHEN 650 MG RE SUPP: 650.0000 mg | Freq: Four times a day (QID) | RECTAL | Status: DC | PRN

## 2013-05-29 MED ORDER — CITALOPRAM HYDROBROMIDE 20 MG PO TABS
20.0000 mg | ORAL_TABLET | Freq: Every day | ORAL | Status: DC
  Administered 2013-05-30: 20 mg via ORAL
  Filled 2013-05-29: qty 1

## 2013-05-29 MED ORDER — METOCLOPRAMIDE HCL 5 MG/ML IJ SOLN
10.0000 mg | Freq: Once | INTRAMUSCULAR | Status: AC
  Administered 2013-05-29: 10 mg via INTRAVENOUS
  Filled 2013-05-29: qty 2

## 2013-05-29 MED ORDER — SODIUM CHLORIDE 0.9 % IV SOLN: 250.0000 mL | INTRAVENOUS | Status: DC | PRN

## 2013-05-29 MED ORDER — DIPHENHYDRAMINE HCL 50 MG/ML IJ SOLN
25.0000 mg | Freq: Once | INTRAMUSCULAR | Status: AC
  Administered 2013-05-29: 25 mg via INTRAVENOUS
  Filled 2013-05-29: qty 1

## 2013-05-29 MED ORDER — VERAPAMIL HCL 40 MG PO TABS
40.0000 mg | ORAL_TABLET | Freq: Every day | ORAL | Status: DC
  Administered 2013-05-30: 40 mg via ORAL
  Filled 2013-05-29 (×2): qty 1

## 2013-05-29 MED ORDER — SODIUM CHLORIDE 0.9 % IJ SOLN: 3.0000 mL | Freq: Two times a day (BID) | INTRAMUSCULAR | Status: DC

## 2013-05-29 MED ORDER — SODIUM CHLORIDE 0.9 % IJ SOLN
3.0000 mL | Freq: Two times a day (BID) | INTRAMUSCULAR | Status: DC
  Administered 2013-05-30 (×2): 3 mL via INTRAVENOUS

## 2013-05-29 MED ORDER — ALUM & MAG HYDROXIDE-SIMETH 200-200-20 MG/5ML PO SUSP: 30.0000 mL | Freq: Four times a day (QID) | ORAL | Status: DC | PRN

## 2013-05-29 MED ORDER — OXYCODONE-ACETAMINOPHEN 5-325 MG PO TABS
1.0000 | ORAL_TABLET | Freq: Four times a day (QID) | ORAL | Status: DC | PRN
  Administered 2013-05-30: 1 via ORAL
  Filled 2013-05-29: qty 1

## 2013-05-29 MED ORDER — ACETAMINOPHEN 500 MG PO TABS
1000.0000 mg | ORAL_TABLET | Freq: Once | ORAL | Status: AC
  Administered 2013-05-29: 1000 mg via ORAL
  Filled 2013-05-29: qty 2

## 2013-05-29 NOTE — H&P (Signed)
PCP:   Pamelia Hoit, MD   Chief Complaint:  Headache, chest pain  HPI: 52 yo female with recent h/o cva, PFO, anxiety, comes in after being found by her husband on the ground rocking back and forth screaming loudly clutching her chest.  At the time pt would not answer her husband, just screaming in pain.  Pt does not recall what happened.  She thinks she lost 3 hours of time where she cannot recollect what happened.  Husband says she was c/o headache and chest pain.  Was also having some left facial drooping and possible left sided weakness.  Pt has mild residual left sided weakness from her recent stroke.  She is on baby asa only.  Now in the ED, she has no complaints.  No worse than normal left sided weakness.  No chest pain.  No facial drooping.  No fevers.  No n/v.  She does have chronic headaches that are poorly controlled.  No vision changes.  No diarrhea.    Review of Systems:  Positive and negative as per HPI otherwise all other systems are negative  Past Medical History: Past Medical History  Diagnosis Date  . IBS (irritable bowel syndrome)     mixed  . GERD (gastroesophageal reflux disease)   . Allergic rhinitis   . Asthma   . Eczema   . Sleep apnea     cpap setting of 16, humidity setting 56f 4% sleep study 07-10-2010 epic  . Rheumatoid arthritis(714.0) 2010    oa and ra  . Nephrolithiasis     current kidney stones  . Plaque psoriasis   . Cerebral vascular malformation     sees dr Lovell Sheehan for monitoring as needed, sees dr lewitt for headaches every 4 months  . Lactose intolerance   . PONV (postoperative nausea and vomiting)     also itching  . Anxiety   . Depression   . Stroke 03/26/2013   Past Surgical History  Procedure Laterality Date  . Incontinence surgery  2010    sling done   . Knee arthroscopy  1992    left  . Foot surgery  1999    right ankle  . Kidney stone surgery  2001  . Appendectomy  1981  . Cholecystectomy  1981  . Tubal ligation  1990  .  Total abdominal hysterectomy  2001  . Hernia repair  2006  . Left foot plating and scarping for arthritis  2011  . Right ear tube insertion  2011  . Incisional hernia repair  05/20/2012    Procedure: HERNIA REPAIR INCISIONAL;  Surgeon: Clovis Pu. Cornett, MD;  Location: [facility] ORS;  Service: General;  Laterality: N/A;  . Tee without cardioversion N/A 05/15/2013    Procedure: TRANSESOPHAGEAL ECHOCARDIOGRAM (TEE);  Surgeon: Pamella Pert, MD;  Location: [facility] ENDOSCOPY;  Service: Cardiovascular;  Laterality: N/A;    Medications: Prior to Admission medications   Medication Sig Start Date End Date Taking? Authorizing Provider  ALPRAZolam Prudy Feeler) 0.5 MG tablet Take 0.5 mg by mouth 2 (two) times daily as needed.    Yes Historical Provider, MD  aspirin 81 MG chewable tablet Chew 1 tablet (81 mg total) by mouth daily. 03/29/13  Yes Jerald Kief, MD  citalopram (CELEXA) 20 MG tablet Take 20 mg by mouth daily.   Yes Historical Provider, MD  clobetasol ointment (TEMOVATE) 0.05 % Apply 1 application topically 2 (two) times daily.  04/25/12  Yes Historical Provider, MD  CycloSPORINE (RESTASIS OP) Apply 4 mLs  to eye 2 (two) times daily.   Yes Historical Provider, MD  folic acid (FOLVITE) 1 MG tablet Take 1 mg by mouth daily.   Yes Historical Provider, MD  gabapentin (NEURONTIN) 300 MG capsule Take 600 mg by mouth 2 (two) times daily.    Yes Historical Provider, MD  loratadine (CLARITIN) 10 MG tablet Take 10 mg by mouth daily.   Yes Historical Provider, MD  Methotrexate Sodium, PF, 100 MG/4ML SOLN Inject 4 mLs as directed. Once a week   Yes Historical Provider, MD  Multiple Vitamin (MULTIVITAMIN) capsule Take 1 capsule by mouth daily.    Yes Historical Provider, MD  NON FORMULARY Place 1 spray into the nose 2 (two) times daily as needed. Lidocaine Spray.  Use as directed for headaches.   Yes Historical Provider, MD  ondansetron (ZOFRAN) 4 MG tablet Take 1 tablet (4 mg total) by mouth every 8 (eight) hours as  needed for nausea. 03/29/13  Yes Jerald Kief, MD  pantoprazole (PROTONIX) 40 MG tablet Take 1 tablet (40 mg total) by mouth daily. 11/25/12 11/25/13 Yes Nira Retort, NP  topiramate (TOPAMAX) 50 MG tablet Take 1 tablet (50 mg total) by mouth 2 (two) times daily. Start one tablet daily at night x 1 week then twice daily if tolerated 05/02/13  Yes Micki Riley, MD  traMADol (ULTRAM) 50 MG tablet Take 50-100 mg by mouth 2 (two) times daily as needed. For pain   Yes Historical Provider, MD  VENTOLIN HFA 108 (90 BASE) MCG/ACT inhaler Inhale 2 puffs into the lungs every 6 (six) hours as needed.  04/25/12  Yes Historical Provider, MD  verapamil (CALAN) 80 MG tablet Take 40 mg by mouth at bedtime. Takes 1/2 of 80 mg tab at hs   Yes Historical Provider, MD  InFLIXimab (REMICADE IV) Inject 3 mg/kg into the vein every 8 (eight) weeks.    Historical Provider, MD  PRESCRIPTION MEDICATION Inject 10 mg into the muscle every Friday. Methotrexate 25 mg/ml.  10 mg = 0.4 ml    Historical Provider, MD    Allergies:   Allergies  Allergen Reactions  . Amoxicillin     REACTION: GI upset  . Aspirin     REACTION: severe gi upset  . Ciprofloxacin     REACTION: angioedema, urticaria  . Cosyntropin     REACTION: swelling, hives  . Guaifenesin     REACTION: head rash  . Moxifloxacin Other (See Comments)    gi upset  . Nsaids Other (See Comments)    Gi upset  . Sulfonamide Derivatives     REACTION: swelling, tongue and hives    Social History:  reports that she quit smoking about 12 years ago. Her smoking use included Cigarettes. She has a 60 pack-year smoking history. She has never used smokeless tobacco. She reports that  drinks alcohol. She reports that she does not use illicit drugs.  Family History: Family History  Problem Relation Age of Onset  . Diabetes Maternal Grandmother   . Arthritis Maternal Grandmother   . Diabetes Brother   . Diabetes Paternal Grandmother   . Diabetes Maternal Grandfather    . Diabetes Paternal Grandfather   . Heart disease Brother   . Heart disease Other     Uncle  . Breast cancer Maternal Aunt   . Ovarian cancer Mother     Physical Exam: Filed Vitals:   05/29/13 1930 05/29/13 1945 05/29/13 2000 05/29/13 2052  BP: 121/62  127/73 128/97  Pulse:  64  64 69  Temp:  98.4 F (36.9 C)    TempSrc:      Resp: 20  22 18   Height:      Weight:      SpO2: 99%  100% 100%   General appearance: alert, cooperative and no distress Head: Normocephalic, without obvious abnormality, atraumatic Eyes: negative Nose: Nares normal. Septum midline. Mucosa normal. No drainage or sinus tenderness. Neck: no JVD and supple, symmetrical, trachea midline Lungs: clear to auscultation bilaterally Heart: regular rate and rhythm, S1, S2 normal, no murmur, click, rub or gallop Abdomen: soft, non-tender; bowel sounds normal; no masses,  no organomegaly Extremities: extremities normal, atraumatic, no cyanosis or edema Pulses: 2+ and symmetric Skin: Skin color, texture, turgor normal. No rashes or lesions Neurologic: Grossly normal  Labs on Admission:   Recent Labs  05/29/13 1808  NA 142  K 3.7  CL 110  CO2 22  GLUCOSE 104*  BUN 15  CREATININE 0.79  CALCIUM 9.8    Recent Labs  05/29/13 1808  WBC 9.2  NEUTROABS 5.5  HGB 13.4  HCT 37.1  MCV 92.5  PLT 237   Radiological Exams on Admission: Dg Chest 2 View  05/29/2013   *RADIOLOGY REPORT*  Clinical Data: Chest pain  CHEST - 2 VIEW  Comparison: 03/26/2013  Findings: The cardiac shadow is stable.  The lungs are well-aerated bilaterally.  No focal infiltrate is seen.  No acute bony abnormality is noted.  IMPRESSION: No acute abnormalities seen.   Original Report Authenticated By: Alcide Clever, M.D.   Ct Head Wo Contrast  05/29/2013   *RADIOLOGY REPORT*  Clinical Data: Altered mental status and chest pain  CT HEAD WITHOUT CONTRAST  Technique:  Contiguous axial images were obtained from the base of the skull through the  vertex without contrast.  Comparison: Head CT 03/26/2013 and MR brain 03/26/2013  Findings: Focal encephalomalacia in the right parietal lobe is consistent with the known parietal lobe infarct described on MRI brain 03/26/2013.  There is a stable hyperdensity in the right frontal lobe measuring 1 cm that has be characterized as a cavernoma on prior MRI.  No acute cortically based infarction is identified.  Negative for acute hemorrhage or midline shift.  The ventricles are normal in size.  Left maxillary sinus polyp or mucosal retention cyst is noted. Hyperdensity within a right nasal turbinate is unchanged.  IMPRESSION:  1.  No acute intracranial abnormality is identified. 2.  Encephalomalacia in the right parietal lobe is compatible with the parietal lobe infarct that occurred in June 2014. 3.  Stable right frontal lobe cavernoma.   Original Report Authenticated By: Britta Mccreedy, M.D.    Assessment/Plan  52 yo female with chest pain, recent cva, pfo, brief period of confusion and ?worsened left weakness Principal Problem:   Headache Active Problems:   Persistent headaches   CVA (cerebral infarction) history   Patent foramen ovale   Chest pain  Place on tele.  Obs.  Cont asa.  Romi.  Neurology has been called for consult, await their recs for further w/u as pt just had major w/u.  She is getting outpt w/u for possible surgical repair of her pfo, awaiting tests of recent cardiac stess testing done as outpt.  Full code.  [name] A 05/29/2013, 8:57 PM

## 2013-05-29 NOTE — ED Notes (Signed)
Pt has left sided weakness which is deficit from previous CVA in June. Pt is calm at this time. States I do not remember the past few hours prior to arriving at this facility.

## 2013-05-29 NOTE — Consult Note (Signed)
Reason for Consult:Episode of unresponsiveness Referring Physician: Micheline Maze  CC: Episode of unresponsiveness  HPI: [name] is an 52 y.o. female who was admitted in June of this year with an acute infarct.  Was found in work up to have a PFO and has been enrolled in a study and followed by Dr.Sethi as an outpatient.  Patient reports that at baseline she has some difficulties with reasoning and ambulates with a cane.   Reports daily headaches since that time as well.   Today the patient's husband was outside doing yard work.  He last checked on her at 1430 at which time she was at baseline.  When he returned 30 minutes later she was in the floor with her arms pulled in to her chest, shaking.  She was able to nod her head in response to questioning about chest pain which she reports that she had.  She was unable to nod  her head to questions after that time.  EMS was called at that time and the patient was brought in for evaluation.  The patient is completely amnestic of the event.  There was no tongue biting or bowel/bladder incontinence.  Afterward her husband reports that she had drooping of her left eye and left side of her mouth.  This has returned to baseline.  Currently complains of a frontal headache that is worse than usual.  At home patient is on Topamax and Ultram.  Does not feel that the Ultram works unless she takes 3-4.   Past Medical History  Diagnosis Date  . IBS (irritable bowel syndrome)     mixed  . GERD (gastroesophageal reflux disease)   . Allergic rhinitis   . Asthma   . Eczema   . Sleep apnea     cpap setting of 16, humidity setting 20f 4% sleep study 07-10-2010 epic  . Rheumatoid arthritis(714.0) 2010    oa and ra  . Nephrolithiasis     current kidney stones  . Plaque psoriasis   . Cerebral vascular malformation     sees dr Lovell Sheehan for monitoring as needed, sees dr lewitt for headaches every 4 months  . Lactose intolerance   . PONV (postoperative nausea and  vomiting)     also itching  . Anxiety   . Depression   . Stroke 03/26/2013    Past Surgical History  Procedure Laterality Date  . Incontinence surgery  2010    sling done   . Knee arthroscopy  1992    left  . Foot surgery  1999    right ankle  . Kidney stone surgery  2001  . Appendectomy  1981  . Cholecystectomy  1981  . Tubal ligation  1990  . Total abdominal hysterectomy  2001  . Hernia repair  2006  . Left foot plating and scarping for arthritis  2011  . Right ear tube insertion  2011  . Incisional hernia repair  05/20/2012    Procedure: HERNIA REPAIR INCISIONAL;  Surgeon: Clovis Pu. Cornett, MD;  Location: [facility];  Service: General;  Laterality: N/A;  . Tee without cardioversion N/A 05/15/2013    Procedure: TRANSESOPHAGEAL ECHOCARDIOGRAM (TEE);  Surgeon: Pamella Pert, MD;  Location: [facility] ENDOSCOPY;  Service: Cardiovascular;  Laterality: N/A;    Family History  Problem Relation Age of Onset  . Diabetes Maternal Grandmother   . Arthritis Maternal Grandmother   . Diabetes Brother   . Diabetes Paternal Grandmother   . Diabetes Maternal Grandfather   . Diabetes  Paternal Grandfather   . Heart disease Brother   . Heart disease Other     Uncle  . Breast cancer Maternal Aunt   . Ovarian cancer Mother     Social History:  reports that she quit smoking about 12 years ago. Her smoking use included Cigarettes. She has a 60 pack-year smoking history. She has never used smokeless tobacco. She reports that  drinks alcohol. She reports that she does not use illicit drugs.  Allergies  Allergen Reactions  . Amoxicillin     REACTION: GI upset  . Aspirin     REACTION: severe gi upset  . Ciprofloxacin     REACTION: angioedema, urticaria  . Cosyntropin     REACTION: swelling, hives  . Guaifenesin     REACTION: head rash  . Moxifloxacin Other (See Comments)    gi upset  . Nsaids Other (See Comments)    Gi upset  . Sulfonamide Derivatives     REACTION: swelling, tongue and  hives    Medications: I have reviewed the patient's current medications. Prior to Admission:   Current outpatient prescriptions: ALPRAZolam (XANAX) 0.5 MG tablet, Take 0.5 mg by mouth 2 (two) times daily as needed. , Disp: , Rfl: ;   aspirin 81 MG chewable tablet, Chew 1 tablet (81 mg total) by mouth daily., Disp: 30 tablet, Rfl: 0;   citalopram (CELEXA) 20 MG tablet, Take 20 mg by mouth daily., Disp: , Rfl: ;   clobetasol ointment (TEMOVATE) 0.05 %, Apply 1 application topically 2 (two) times daily. , Disp: , Rfl:  CycloSPORINE (RESTASIS OP), Apply 4 mLs to eye 2 (two) times daily., Disp: , Rfl: ;   folic acid (FOLVITE) 1 MG tablet, Take 1 mg by mouth daily., Disp: , Rfl: ;   gabapentin (NEURONTIN) 300 MG capsule, Take 600 mg by mouth 2 (two) times daily. , Disp: , Rfl: ;   loratadine (CLARITIN) 10 MG tablet, Take 10 mg by mouth daily., Disp: , Rfl: ;   Methotrexate Sodium, PF, 100 MG/4ML SOLN, Inject 4 mLs as directed. Once a week, Disp: , Rfl:  Multiple Vitamin (MULTIVITAMIN) capsule, Take 1 capsule by mouth daily. , Disp: , Rfl: ;  NON FORMULARY, Place 1 spray into the nose 2 (two) times daily as needed. Lidocaine Spray.  Use as directed for headaches., Disp: , Rfl: ;   ondansetron (ZOFRAN) 4 MG tablet, Take 1 tablet (4 mg total) by mouth every 8 (eight) hours as needed for nausea., Disp: 20 tablet, Rfl: 0 pantoprazole (PROTONIX) 40 MG tablet, Take 1 tablet (40 mg total) by mouth daily., Disp: 30 tablet, Rfl: 3;   topiramate (TOPAMAX) 50 MG tablet, Take 1 tablet (50 mg total) by mouth 2 (two) times daily. Start one tablet daily at night x 1 week then twice daily if tolerated, Disp: 100 tablet, Rfl: 1;   traMADol (ULTRAM) 50 MG tablet, Take 50-100 mg by mouth 2 (two) times daily as needed. For pain, Disp: , Rfl:  VENTOLIN HFA 108 (90 BASE) MCG/ACT inhaler, Inhale 2 puffs into the lungs every 6 (six) hours as needed. , Disp: , Rfl: ;   verapamil (CALAN) 80 MG tablet, Take 40 mg by mouth at  bedtime. Takes 1/2 of 80 mg tab at hs, Disp: , Rfl: ;   InFLIXimab (REMICADE IV), Inject 3 mg/kg into the vein every 8 (eight) weeks., Disp: , Rfl: ;  PRESCRIPTION MEDICATION, Inject 10 mg into the muscle every Friday. Methotrexate 25 mg/ml.  10 mg =  0.4 ml, Disp: , Rfl:   ROS: History obtained from the patient  General ROS: negative for - chills, fatigue, fever, night sweats, weight gain or weight loss Psychological ROS: negative for - behavioral disorder, hallucinations, memory difficulties, mood swings or suicidal ideation Ophthalmic ROS: negative for - blurry vision, double vision, eye pain or loss of vision ENT ROS: negative for - epistaxis, nasal discharge, oral lesions, sore throat, tinnitus or vertigo Allergy and Immunology ROS: negative for - hives or itchy/watery eyes Hematological and Lymphatic ROS: negative for - bleeding problems, bruising or swollen lymph nodes Endocrine ROS: negative for - galactorrhea, hair pattern changes, polydipsia/polyuria or temperature intolerance Respiratory ROS: negative for - cough, hemoptysis, shortness of breath or wheezing Cardiovascular ROS: negative for - chest pain, dyspnea on exertion, edema or irregular heartbeat Gastrointestinal ROS: negative for - abdominal pain, diarrhea, hematemesis, nausea/vomiting or stool incontinence Genito-Urinary ROS: negative for - dysuria, hematuria, incontinence or urinary frequency/urgency Musculoskeletal ROS: negative for - joint swelling or muscular weakness Neurological ROS: as noted in HPI Dermatological ROS: negative for rash and skin lesion changes  Physical Examination: Blood pressure 124/73, pulse 65, temperature 98.4 F (36.9 C), temperature source Oral, resp. rate 15, height 5\' 5"  (1.651 m), weight 69.4 kg (153 lb), SpO2 100.00%.  Neurologic Examination Mental Status: Alert, oriented, thought content appropriate.  Speech fluent without evidence of aphasia.  Able to follow 3 step commands without  difficulty. Cranial Nerves: II: Discs flat bilaterally; Visual fields grossly normal with some peripheral vision loss to the far left, pupils equal, round, reactive to light and accommodation III,IV, VI: ptosis not present, extra-ocular motions intact bilaterally V,VII: smile symmetric, facial light touch sensation decreased on the left VIII: hearing normal bilaterally IX,X: gag reflex present XI: bilateral shoulder shrug XII: midline tongue extension Motor: Right : Upper extremity   5/5    Left:     Upper extremity   5/5  Lower extremity   5/5     Lower extremity   5/5 Patient has bilateral give-way weakness but no focality Sensory: Pinprick and light touch decreased on the left Deep Tendon Reflexes: 2+ and symmetric throughout Plantars: Right: downgoing   Left: downgoing Cerebellar: normal finger-to-nose and normal heel-to-shin test Gait: Unable to test CV: pulses palpable throughout   Laboratory Studies:   Basic Metabolic Panel:  Recent Labs Lab 05/29/13 1808  NA 142  K 3.7  CL 110  CO2 22  GLUCOSE 104*  BUN 15  CREATININE 0.79  CALCIUM 9.8    Liver Function Tests: No results found for this basename: AST, ALT, ALKPHOS, BILITOT, PROT, ALBUMIN,  in the last 168 hours No results found for this basename: LIPASE, AMYLASE,  in the last 168 hours No results found for this basename: AMMONIA,  in the last 168 hours  CBC:  Recent Labs Lab 05/29/13 1808  WBC 9.2  NEUTROABS 5.5  HGB 13.4  HCT 37.1  MCV 92.5  PLT 237    Cardiac Enzymes: No results found for this basename: CKTOTAL, CKMB, CKMBINDEX, TROPONINI,  in the last 168 hours  BNP: No components found with this basename: POCBNP,   CBG: No results found for this basename: GLUCAP,  in the last 168 hours  Microbiology: Results for orders placed during the hospital encounter of 03/26/13  MRSA PCR SCREENING     Status: None   Collection Time    03/26/13  6:57 PM      Result Value Range Status   MRSA by PCR  NEGATIVE  NEGATIVE Final   Comment:            The GeneXpert MRSA Assay (FDA     approved for NASAL specimens     only), is one component of a     comprehensive MRSA colonization     surveillance program. It is not     intended to diagnose MRSA     infection nor to guide or     monitor treatment for     MRSA infections.    Coagulation Studies:  Recent Labs  05/29/13 1808  LABPROT 13.1  INR 1.01    Urinalysis:  Recent Labs Lab 05/29/13 2029  COLORURINE YELLOW  LABSPEC 1.019  PHURINE 7.5  GLUCOSEU NEGATIVE  HGBUR NEGATIVE  BILIRUBINUR NEGATIVE  KETONESUR NEGATIVE  PROTEINUR NEGATIVE  UROBILINOGEN 0.2  NITRITE NEGATIVE  LEUKOCYTESUR TRACE*    Lipid Panel:     Component Value Date/Time   CHOL 139 03/27/2013 0540   TRIG 135 03/27/2013 0540   HDL 23* 03/27/2013 0540   CHOLHDL 6.0 03/27/2013 0540   VLDL 27 03/27/2013 0540   LDLCALC 89 03/27/2013 0540    HgbA1C:  Lab Results  Component Value Date   HGBA1C 5.6 03/27/2013    Urine Drug Screen:   No results found for this basename: labopia, cocainscrnur, labbenz, amphetmu, thcu, labbarb    Alcohol Level: No results found for this basename: ETH,  in the last 168 hours   Imaging: Dg Chest 2 View  05/29/2013   *RADIOLOGY REPORT*  Clinical Data: Chest pain  CHEST - 2 VIEW  Comparison: 03/26/2013  Findings: The cardiac shadow is stable.  The lungs are well-aerated bilaterally.  No focal infiltrate is seen.  No acute bony abnormality is noted.  IMPRESSION: No acute abnormalities seen.   Original Report Authenticated By: Alcide Clever, M.D.   Ct Head Wo Contrast  05/29/2013   *RADIOLOGY REPORT*  Clinical Data: Altered mental status and chest pain  CT HEAD WITHOUT CONTRAST  Technique:  Contiguous axial images were obtained from the base of the skull through the vertex without contrast.  Comparison: Head CT 03/26/2013 and MR brain 03/26/2013  Findings: Focal encephalomalacia in the right parietal lobe is consistent with the  known parietal lobe infarct described on MRI brain 03/26/2013.  There is a stable hyperdensity in the right frontal lobe measuring 1 cm that has be characterized as a cavernoma on prior MRI.  No acute cortically based infarction is identified.  Negative for acute hemorrhage or midline shift.  The ventricles are normal in size.  Left maxillary sinus polyp or mucosal retention cyst is noted. Hyperdensity within a right nasal turbinate is unchanged.  IMPRESSION:  1.  No acute intracranial abnormality is identified. 2.  Encephalomalacia in the right parietal lobe is compatible with the parietal lobe infarct that occurred in June 2014. 3.  Stable right frontal lobe cavernoma.   Original Report Authenticated By: Britta Mccreedy, M.D.     Assessment/Plan: 52 year old female recently admitted for an acute infarct in June who now returns after an episode of poor responsiveness.  Patient is amnestic of the event.  Examination is currently at baseline.  Head CT has been reviewed and shows right parietal lobe encephalomalacia but no acute abnormalities.  Can not rule out the possibility of another acute infarct, embolic in nature, with an associated seizure particularly in this patient with current risk factors.  Also can not rule out seizure related to area of encephalomalacia.  Further work up recommended.    Recommendations: 1.  MRI of the brain without contrast.  Would not repeat stroke work up at this time.   2.  EEG 3.  Seizure precautions 4.  Ativan prn 5.  Percocet for headache  Thana Farr, MD [facility] [phone] 05/29/2013, 10:15 PM

## 2013-05-29 NOTE — ED Notes (Signed)
Onset today patient at home with husband and husband called 911 thought wife was having a stroke because she was not talking.  EMS arrived states patient was crying hyperventilating en route became more calm tearful nonverbal point to chest and head. Airway intact bilateral equal chest rise and fall.

## 2013-05-29 NOTE — ED Notes (Signed)
Patient transported to [facility] to await for inpatient bed assignment. Care transferred and report given to [place], Charity fundraiser.

## 2013-05-29 NOTE — ED Provider Notes (Signed)
CSN: [id number]     Arrival date & time 05/29/13  1719 History     First MD Initiated Contact with Patient 05/29/13 1721     Chief Complaint  Patient presents with  . Altered Mental Status  . Chest Pain   (Consider location/radiation/quality/duration/timing/severity/associated sxs/prior Treatment) Patient is a 52 y.o. female presenting with headaches. The history is provided by the patient and the spouse.  Headache Pain location:  Frontal Quality:  Dull Radiates to:  Does not radiate Severity currently:  9/10 Severity at highest:  9/10 Onset quality:  Unable to specify Timing:  Constant Progression:  Unchanged Chronicity:  Recurrent Similar to prior headaches: yes   Relieved by:  Nothing Worsened by:  Nothing tried Ineffective treatments:  None tried Associated symptoms: no abdominal pain, no back pain, no congestion, no cough, no diarrhea, no dizziness, no ear pain, no fatigue, no fever, no nausea, no neck pain, no neck stiffness, no numbness, no seizures, no sore throat and no vomiting     Past Medical History  Diagnosis Date  . IBS (irritable bowel syndrome)     mixed  . GERD (gastroesophageal reflux disease)   . Allergic rhinitis   . Asthma   . Eczema   . Sleep apnea     cpap setting of 16, humidity setting 12f 4% sleep study 07-10-2010 epic  . Rheumatoid arthritis(714.0) 2010    oa and ra  . Nephrolithiasis     current kidney stones  . Plaque psoriasis   . Cerebral vascular malformation     sees dr Lovell Sheehan for monitoring as needed, sees dr lewitt for headaches every 4 months  . Lactose intolerance   . PONV (postoperative nausea and vomiting)     also itching  . Anxiety   . Depression   . Stroke 03/26/2013   Past Surgical History  Procedure Laterality Date  . Incontinence surgery  2010    sling done   . Knee arthroscopy  1992    left  . Foot surgery  1999    right ankle  . Kidney stone surgery  2001  . Appendectomy  1981  . Cholecystectomy  1981  .  Tubal ligation  1990  . Total abdominal hysterectomy  2001  . Hernia repair  2006  . Left foot plating and scarping for arthritis  2011  . Right ear tube insertion  2011  . Incisional hernia repair  05/20/2012    Procedure: HERNIA REPAIR INCISIONAL;  Surgeon: Clovis Pu. Cornett, MD;  Location: [facility];  Service: General;  Laterality: N/A;  . Tee without cardioversion N/A 05/15/2013    Procedure: TRANSESOPHAGEAL ECHOCARDIOGRAM (TEE);  Surgeon: Pamella Pert, MD;  Location: [facility] ENDOSCOPY;  Service: Cardiovascular;  Laterality: N/A;   Family History  Problem Relation Age of Onset  . Diabetes Maternal Grandmother   . Arthritis Maternal Grandmother   . Diabetes Brother   . Diabetes Paternal Grandmother   . Diabetes Maternal Grandfather   . Diabetes Paternal Grandfather   . Heart disease Brother   . Heart disease Other     Uncle  . Breast cancer Maternal Aunt   . Ovarian cancer Mother    History  Substance Use Topics  . Smoking status: Former Smoker -- 2.00 packs/day for 30 years    Types: Cigarettes    Quit date: 10/16/2000  . Smokeless tobacco: Never Used  . Alcohol Use: Yes     Comment: rare   OB History   Grav Para Term  Preterm Abortions TAB SAB Ect Mult Living                 Review of Systems  Constitutional: Negative for fever, chills, diaphoresis and fatigue.  HENT: Negative for ear pain, congestion, sore throat, facial swelling, mouth sores, trouble swallowing, neck pain and neck stiffness.   Eyes: Negative.   Respiratory: Negative for apnea, cough, chest tightness, shortness of breath and wheezing.   Cardiovascular: Negative for chest pain, palpitations and leg swelling.  Gastrointestinal: Negative for nausea, vomiting, abdominal pain, diarrhea and abdominal distention.  Genitourinary: Negative for hematuria, flank pain, vaginal discharge, difficulty urinating and menstrual problem.  Musculoskeletal: Negative for back pain and gait problem.  Skin: Negative for rash  and wound.  Neurological: Positive for headaches. Negative for dizziness, tremors, seizures, syncope, facial asymmetry and numbness.  Psychiatric/Behavioral: The patient is nervous/anxious.   All other systems reviewed and are negative.    Allergies  Amoxicillin; Aspirin; Ciprofloxacin; Cosyntropin; Guaifenesin; Moxifloxacin; Nsaids; and Sulfonamide derivatives  Home Medications   No current outpatient prescriptions on file. BP 115/59  Pulse 57  Temp(Src) 97.7 F (36.5 C) (Oral)  Resp 16  Ht 5\' 5"  (1.651 m)  Wt 153 lb (69.4 kg)  BMI 25.46 kg/m2  SpO2 98% Physical Exam  Nursing note and vitals reviewed. Constitutional: She is oriented to person, place, and time. She appears well-developed and well-nourished. No distress.  HENT:  Head: Normocephalic and atraumatic.  Right Ear: External ear normal.  Left Ear: External ear normal.  Nose: Nose normal.  Mouth/Throat: Oropharynx is clear and moist. No oropharyngeal exudate.  Eyes: Conjunctivae and EOM are normal. Pupils are equal, round, and reactive to light. Right eye exhibits no discharge. Left eye exhibits no discharge.  Neck: Normal range of motion. Neck supple. No JVD present. No tracheal deviation present. No thyromegaly present.  Cardiovascular: Normal rate, regular rhythm, normal heart sounds and intact distal pulses.  Exam reveals no gallop and no friction rub.   No murmur heard. Pulmonary/Chest: Effort normal and breath sounds normal. No respiratory distress. She has no wheezes. She has no rales. She exhibits no tenderness.  Abdominal: Soft. Bowel sounds are normal. She exhibits no distension. There is no tenderness. There is no rebound and no guarding.  Musculoskeletal: Normal range of motion.  Lymphadenopathy:    She has no cervical adenopathy.  Neurological: She is alert and oriented to person, place, and time. A sensory deficit (patient says she has some tingling in the left side of her face) is present. No cranial  nerve deficit. Coordination normal.  Patient with normal finger to nose and strength  Skin: Skin is warm. No rash noted. She is not diaphoretic.  Psychiatric: She has a normal mood and affect. Her behavior is normal. Judgment and thought content normal.    ED Course   Procedures (including critical care time)  Labs Reviewed  CBC WITH DIFFERENTIAL - Abnormal; Notable for the following:    MCHC 36.1 (*)    All other components within normal limits  BASIC METABOLIC PANEL - Abnormal; Notable for the following:    Glucose, Bld 104 (*)    All other components within normal limits  URINALYSIS, ROUTINE W REFLEX MICROSCOPIC - Abnormal; Notable for the following:    APPearance TURBID (*)    Leukocytes, UA TRACE (*)    All other components within normal limits  MRSA PCR SCREENING  PROTIME-INR  URINE MICROSCOPIC-ADD ON  TROPONIN I  BASIC METABOLIC PANEL  CBC  POCT I-STAT TROPONIN I   Dg Chest 2 View  05/29/2013   *RADIOLOGY REPORT*  Clinical Data: Chest pain  CHEST - 2 VIEW  Comparison: 03/26/2013  Findings: The cardiac shadow is stable.  The lungs are well-aerated bilaterally.  No focal infiltrate is seen.  No acute bony abnormality is noted.  IMPRESSION: No acute abnormalities seen.   Original Report Authenticated By: Alcide Clever, M.D.   Ct Head Wo Contrast  05/29/2013   *RADIOLOGY REPORT*  Clinical Data: Altered mental status and chest pain  CT HEAD WITHOUT CONTRAST  Technique:  Contiguous axial images were obtained from the base of the skull through the vertex without contrast.  Comparison: Head CT 03/26/2013 and MR brain 03/26/2013  Findings: Focal encephalomalacia in the right parietal lobe is consistent with the known parietal lobe infarct described on MRI brain 03/26/2013.  There is a stable hyperdensity in the right frontal lobe measuring 1 cm that has be characterized as a cavernoma on prior MRI.  No acute cortically based infarction is identified.  Negative for acute hemorrhage or  midline shift.  The ventricles are normal in size.  Left maxillary sinus polyp or mucosal retention cyst is noted. Hyperdensity within a right nasal turbinate is unchanged.  IMPRESSION:  1.  No acute intracranial abnormality is identified. 2.  Encephalomalacia in the right parietal lobe is compatible with the parietal lobe infarct that occurred in June 2014. 3.  Stable right frontal lobe cavernoma.   Original Report Authenticated By: Britta Mccreedy, M.D.   1. Headache   2. Chest pain   3. Patent foramen ovale   4. Persistent headaches   5. Other convulsions     MDM  52 yr old F pt here with HA. Pt says that she has a HA like this in the past when she had a stroke. Patient with some tingling to the side of the face but no other focalizing neuro deficits. Patient says this is not like her migraines of the past. Will immediately send the patient to CT for any evidence of bleed or stroke given her hx. She also says that she has a hx of aneurysms.  Ct head is unchanged from previous and her symptoms have improved much with headache cocktail. Patient still feels like this is a departure from normal headaches and has many risk factors for TIA. Will admit for TIA work up. Labs otherwise at baseline.   Date: 05/29/2013  Rate: 54  Rhythm: sinus bradycardia  QRS Axis: normal  Intervals: normal  ST/T Wave abnormalities: normal  Conduction Disutrbances:none  Narrative Interpretation:   Old EKG Reviewed: unchanged  Case discussed with Dr. Catarina Hartshorn, MD 05/30/13 [phone]

## 2013-05-29 NOTE — ED Notes (Signed)
Family at bedside. 

## 2013-05-29 NOTE — ED Notes (Signed)
Pt crying tearful, reporting being scared about not remembering what happened earlier, asking for her husband.

## 2013-05-30 ENCOUNTER — Observation Stay (HOSPITAL_COMMUNITY): Payer: BC Managed Care – PPO

## 2013-05-30 DIAGNOSIS — R404 Transient alteration of awareness: Secondary | ICD-10-CM

## 2013-05-30 DIAGNOSIS — I6350 Cerebral infarction due to unspecified occlusion or stenosis of unspecified cerebral artery: Secondary | ICD-10-CM

## 2013-05-30 DIAGNOSIS — R4182 Altered mental status, unspecified: Secondary | ICD-10-CM

## 2013-05-30 LAB — CBC
MCH: 32.6 pg (ref 26.0–34.0)
MCV: 93.6 fL (ref 78.0–100.0)
Platelets: 201 10*3/uL (ref 150–400)
RDW: 13.2 % (ref 11.5–15.5)
WBC: 7 10*3/uL (ref 4.0–10.5)

## 2013-05-30 LAB — BASIC METABOLIC PANEL
CO2: 21 mEq/L (ref 19–32)
Calcium: 9.1 mg/dL (ref 8.4–10.5)
Creatinine, Ser: 0.5 mg/dL (ref 0.50–1.10)
GFR calc non Af Amer: >90 mL/min (ref >90)
Glucose, Bld: 101 mg/dL — ABNORMAL HIGH (ref 70–99)

## 2013-05-30 LAB — TROPONIN I
Troponin I: <0.3 ng/mL (ref <0.30)
Troponin I: <0.3 ng/mL (ref <0.30)

## 2013-05-30 LAB — MRSA PCR SCREENING: MRSA by PCR: NEGATIVE

## 2013-05-30 MED ORDER — TOPIRAMATE 25 MG PO TABS
50.0000 mg | ORAL_TABLET | Freq: Two times a day (BID) | ORAL | Status: DC
  Filled 2013-05-30 (×2): qty 2

## 2013-05-30 MED ORDER — ENOXAPARIN SODIUM 40 MG/0.4ML ~~LOC~~ SOLN
40.0000 mg | SUBCUTANEOUS | Status: DC
  Filled 2013-05-30: qty 0.4

## 2013-05-30 NOTE — Progress Notes (Signed)
Spoken with Dr. Cyril Mourning who reviewed the MRI brain, no acute infarcts. At this time he does not suggest initiation of antiepileptic therapy. He currently has no further recommendations, except for close monitoring as an outpatient. I've spoken with the patient and her husband at bedside, above recommendations from neurology has been conveyed to them. I instructed them to bring the patient back to the emergency room if she were to sustain a similar episode in the future. I also instructed them to make an appointment with her primary care practitioner for further continued care and workup.

## 2013-05-30 NOTE — Discharge Summary (Signed)
PATIENT DETAILS Name: [name] Age: 52 y.o. Sex: female Date of Birth: [birth year] MRN: [id number]. Admit Date: 05/29/2013 Admitting Physician: Tarry Kos, MD WUJ:[id number],NWGN Sherilyn Cooter, MD  Recommendations for Outpatient Follow-up:  1. Claims to feel tired and fatigued ever since her stroke, please initiate further workup in the outpatient setting.  PRIMARY DISCHARGE DIAGNOSIS:  Principal Problem:   Headache Active Problems:   Transient episode of alteration of consciousness with amnesia   Persistent headaches   CVA (cerebral infarction) history   Patent foramen ovale   Chest pain     PAST MEDICAL HISTORY: Past Medical History  Diagnosis Date  . IBS (irritable bowel syndrome)     mixed  . GERD (gastroesophageal reflux disease)   . Allergic rhinitis   . Asthma   . Eczema   . Sleep apnea     cpap setting of 16, humidity setting 23f 4% sleep study 07-10-2010 epic  . Rheumatoid arthritis(714.0) 2010    oa and ra  . Nephrolithiasis     current kidney stones  . Plaque psoriasis   . Cerebral vascular malformation     sees dr Lovell Sheehan for monitoring as needed, sees dr lewitt for headaches every 4 months  . Lactose intolerance   . PONV (postoperative nausea and vomiting)     also itching  . Anxiety   . Depression   . Stroke 03/26/2013    DISCHARGE MEDICATIONS:   Medication List         ALPRAZolam 0.5 MG tablet  Commonly known as:  XANAX  Take 0.5 mg by mouth 2 (two) times daily as needed.     aspirin 81 MG chewable tablet  Chew 1 tablet (81 mg total) by mouth daily.     citalopram 20 MG tablet  Commonly known as:  CELEXA  Take 20 mg by mouth daily.     clobetasol ointment 0.05 %  Commonly known as:  TEMOVATE  Apply 1 application topically 2 (two) times daily.     folic acid 1 MG tablet  Commonly known as:  FOLVITE  Take 1 mg by mouth daily.     gabapentin 300 MG capsule  Commonly known as:  NEURONTIN  Take 600 mg by mouth 2 (two) times daily.      loratadine 10 MG tablet  Commonly known as:  CLARITIN  Take 10 mg by mouth daily.     Methotrexate Sodium (PF) 100 MG/4ML Soln  Inject 4 mLs as directed. Once a week     multivitamin capsule  Take 1 capsule by mouth daily.     NON FORMULARY  Place 1 spray into the nose 2 (two) times daily as needed. Lidocaine Spray.  Use as directed for headaches.     ondansetron 4 MG tablet  Commonly known as:  ZOFRAN  Take 1 tablet (4 mg total) by mouth every 8 (eight) hours as needed for nausea.     pantoprazole 40 MG tablet  Commonly known as:  PROTONIX  Take 1 tablet (40 mg total) by mouth daily.     pravastatin 20 MG tablet  Commonly known as:  PRAVACHOL  Take 20 mg by mouth daily.     PRESCRIPTION MEDICATION  - Inject 10 mg into the muscle every Friday. Methotrexate 25 mg/ml.   - 10 mg = 0.4 ml     REMICADE IV  Inject 3 mg/kg into the vein every 8 (eight) weeks.     RESTASIS OP  Apply 4 mLs to eye 2 (  two) times daily.     topiramate 50 MG tablet  Commonly known as:  TOPAMAX  Take 1 tablet (50 mg total) by mouth 2 (two) times daily. Start one tablet daily at night x 1 week then twice daily if tolerated     traMADol 50 MG tablet  Commonly known as:  ULTRAM  Take 50-100 mg by mouth 2 (two) times daily as needed. For pain     VENTOLIN HFA 108 (90 BASE) MCG/ACT inhaler  Generic drug:  albuterol  Inhale 2 puffs into the lungs every 6 (six) hours as needed.     verapamil 80 MG tablet  Commonly known as:  CALAN  Take 40 mg by mouth at bedtime. Takes 1/2 of 80 mg tab at hs        ALLERGIES:   Allergies  Allergen Reactions  . Amoxicillin     REACTION: GI upset  . Aspirin     REACTION: severe gi upset  . Ciprofloxacin     REACTION: angioedema, urticaria  . Cosyntropin     REACTION: swelling, hives  . Guaifenesin     REACTION: head rash  . Moxifloxacin Other (See Comments)    gi upset  . Nsaids Other (See Comments)    Gi upset  . Sulfonamide Derivatives      REACTION: swelling, tongue and hives    BRIEF HPI:  See H&P, Labs, Consult and Test reports for all details in brief, patient is a 52 year old female with a history of her recent CVA, further workup determined that she had a due for as well, she has a history of irritable bowel syndrome, rheumatoid arthritis who presented to the emergency room for evaluation of a transient period of altered consciousness with amnesia. She was apparently also having chest pain during this episode. She had worsening of her chronic headaches. She was admitted for further evaluation and treatment.  CONSULTATIONS:   neurology  PERTINENT RADIOLOGIC STUDIES: Dg Chest 2 View  05/29/2013   *RADIOLOGY REPORT*  Clinical Data: Chest pain  CHEST - 2 VIEW  Comparison: 03/26/2013  Findings: The cardiac shadow is stable.  The lungs are well-aerated bilaterally.  No focal infiltrate is seen.  No acute bony abnormality is noted.  IMPRESSION: No acute abnormalities seen.   Original Report Authenticated By: Alcide Clever, M.D.   Ct Head Wo Contrast  05/29/2013   *RADIOLOGY REPORT*  Clinical Data: Altered mental status and chest pain  CT HEAD WITHOUT CONTRAST  Technique:  Contiguous axial images were obtained from the base of the skull through the vertex without contrast.  Comparison: Head CT 03/26/2013 and MR brain 03/26/2013  Findings: Focal encephalomalacia in the right parietal lobe is consistent with the known parietal lobe infarct described on MRI brain 03/26/2013.  There is a stable hyperdensity in the right frontal lobe measuring 1 cm that has be characterized as a cavernoma on prior MRI.  No acute cortically based infarction is identified.  Negative for acute hemorrhage or midline shift.  The ventricles are normal in size.  Left maxillary sinus polyp or mucosal retention cyst is noted. Hyperdensity within a right nasal turbinate is unchanged.  IMPRESSION:  1.  No acute intracranial abnormality is identified. 2.  Encephalomalacia in  the right parietal lobe is compatible with the parietal lobe infarct that occurred in June 2014. 3.  Stable right frontal lobe cavernoma.   Original Report Authenticated By: Britta Mccreedy, M.D.   Mr Brain Wo Contrast  05/30/2013   *RADIOLOGY REPORT*  Clinical Data: History of acute infarction June 2014.  Patent foramen ovale.  Possible seizure today.  MRI HEAD WITHOUT CONTRAST  Technique:  Multiplanar, multiecho pulse sequences of the brain and surrounding structures were obtained according to standard protocol without intravenous contrast.  Comparison: CT head 05/29/2013.  MR head 03/26/2013.  Findings: No acute stroke or acute hemorrhage.  No hydrocephalus or extra-axial fluid.  Encephalomalacia related to the previous right parietal cortical and subcortical infarction.  Unchanged right frontal 1 cm subcortical lesion with blood products, a subacute and chronic nature most consistent with a cavernoma.  Normal cerebral volume.  No white matter disease.  Flow voids are maintained in the major intracranial vascular structures. No sinus or mastoid disease.  IMPRESSION: Expected evolutionary change related to chronic right parietal infarct.  No new infarction is seen.  Unchanged right frontal cavernoma.   Original Report Authenticated By: Davonna Belling, M.D.     PERTINENT LAB RESULTS: CBC:  Recent Labs  05/29/13 1808 05/30/13 0445  WBC 9.2 7.0  HGB 13.4 12.7  HCT 37.1 36.5  PLT 237 201   CMET CMP     Component Value Date/Time   NA 142 05/30/2013 0445   K 3.9 05/30/2013 0445   CL 112 05/30/2013 0445   CO2 21 05/30/2013 0445   GLUCOSE 101* 05/30/2013 0445   BUN 13 05/30/2013 0445   CREATININE 0.50 05/30/2013 0445   CALCIUM 9.1 05/30/2013 0445   PROT 7.0 05/10/2012 1350   ALBUMIN 3.8 05/10/2012 1350   AST 15 05/10/2012 1350   ALT 10 05/10/2012 1350   ALKPHOS 82 05/10/2012 1350   BILITOT 0.2* 05/10/2012 1350   GFRNONAA >90 05/30/2013 0445   GFRAA >90 05/30/2013 0445    GFR Estimated Creatinine  Clearance: 78.7 ml/min (by C-G formula based on Cr of 0.5). No results found for this basename: LIPASE, AMYLASE,  in the last 72 hours  Recent Labs  05/29/13 2349 05/30/13 0749 05/30/13 1352  TROPONINI <0.30 <0.30 <0.30   No components found with this basename: POCBNP,  No results found for this basename: DDIMER,  in the last 72 hours No results found for this basename: HGBA1C,  in the last 72 hours No results found for this basename: CHOL, HDL, LDLCALC, TRIG, CHOLHDL, LDLDIRECT,  in the last 72 hours No results found for this basename: TSH, T4TOTAL, FREET3, T3FREE, THYROIDAB,  in the last 72 hours No results found for this basename: VITAMINB12, FOLATE, FERRITIN, TIBC, IRON, RETICCTPCT,  in the last 72 hours Coags:  Recent Labs  05/29/13 1808  INR 1.01   Microbiology: Recent Results (from the past 240 hour(s))  MRSA PCR SCREENING     Status: None   Collection Time    05/30/13 12:14 AM      Result Value Range Status   MRSA by PCR NEGATIVE  NEGATIVE Final   Comment:            The GeneXpert MRSA Assay (FDA     approved for NASAL specimens     only), is one component of a     comprehensive MRSA colonization     surveillance program. It is not     intended to diagnose MRSA     infection nor to guide or     monitor treatment for     MRSA infections.     BRIEF HOSPITAL COURSE:  Transient episode of altered consciousness with amnesia - Please see history and physical, neurology consultation for details, however in brief patient  presented with an episode of alteration in consciousness, with chest pain. She transiently developed drooping of the left eye and left side of her mouth after this episode. She remained amnestic of this episode. She was then admitted for further workup. - An MRI of the brain was done which did not show any acute changes. A EEG was done which was negative for any seizure activity. Neurology was consulted as well. I have spoken with Dr Cyril Mourning, neurologist  on call, at this time he does not have any further recommendations. Atypical seizures were a definite consideration, however at this time neurology does not recommend initiation of antiepileptic therapy. Our recommendation at this time is for continued close observation in the outpatient setting, if she continues to have these episodes she may need a prolonged or a continuous EEG. Patient and family have been instructed to return to the emergency room if these episodes recur. There have claimed understanding.  Chest pain - This occurred during the above event. This is also associated with worsening of her chronic headaches. She was monitored on telemetry, cardiac enzymes were cycled and these were negative. She claimed that she had underwent a recent stress test by Dr. Jacinto Halim, I subsequently spoke with Dr. Jacinto Halim who relayed that this was negative. Cardiac enzymes were negative x3.  CVA (cerebral infarction) history  - On aspirin, very minimal left-sided deficits  - Follows with Dr. Pearlean Brownie as an outpatient - She claims that ever since her CVA, she has been fatigued, tired and has had constant headaches. She has been started on Topamax and tramadol by Dr. Pearlean Brownie. I have asked that she followup with her primary care practitioner as well. - Family was also concerned about her not doing cognitively well after his CVA, patient's husband did indicate to me that he has made the appropriate appointment with a specialist for neuropsychological care.   Patent foramen ovale  - Outpatient follow up with Dr. Pearlean Brownie in Dr. Jacinto Halim.  GERD  - PPI   Obstructive sleep apnea  - CPAP  TODAY-DAY OF DISCHARGE:  Subjective:   [name] today has had no further recurrence of her presenting symptoms. Workup so far negative.  Objective:   Blood pressure 121/63, pulse 64, temperature 98.2 F (36.8 C), temperature source Oral, resp. rate 18, height 5\' 5"  (1.651 m), weight 69.4 kg (153 lb), SpO2  100.00%.  Intake/Output Summary (Last 24 hours) at 05/30/13 1642 Last data filed at 05/30/13 1311  Gross per 24 hour  Intake    600 ml  Output    500 ml  Net    100 ml   Filed Weights   05/29/13 1737 05/29/13 2252  Weight: 69.4 kg (153 lb) 69.4 kg (153 lb)    Exam Awake Alert, Oriented *3, No new F.N deficits, Normal affect [place].AT,PERRAL Supple Neck,No JVD, No cervical lymphadenopathy appriciated.  Symmetrical Chest wall movement, Good air movement bilaterally, CTAB RRR,No Gallops,Rubs or new Murmurs, No Parasternal Heave +ve B.Sounds, Abd Soft, Non tender, No organomegaly appriciated, No rebound -guarding or rigidity. No Cyanosis, Clubbing or edema, No new Rash or bruise  DISCHARGE CONDITION: Stable  DISPOSITION: Home  DISCHARGE INSTRUCTIONS:    Activity:  As tolerated with Full fall precautions use walker/cane & assistance as needed  Diet recommendation: Heart Healthy diet   Discharge Orders   Future Appointments Provider Department Dept Phone   06/03/2013 3:00 PM Ronal Fear, NP [facility] [phone]   09/09/2013 3:45 PM Barbaraann Share, MD [facility] [phone]   Future Orders Complete By Expires   Call MD for:  extreme fatigue  As directed    Call MD for:  persistant dizziness or light-headedness  As directed    Call MD for:  As directed    Scheduling Instructions:     If presenting symptoms reccur   Diet - low sodium heart healthy  As directed    Increase activity slowly  As directed       Follow-up Information   Follow up with Pamelia Hoit, MD. Schedule an appointment as soon as possible for a visit in 1 day.   Specialty:  Family Medicine   Contact information:   [address] [place] [address] [phone]       Follow up with Gates Rigg, MD. (please keep next appointment)    Specialties:  Neurology, Radiology   Contact information:   [address] [place]  [address] [phone]       Follow up with Pamella Pert, MD. (please keep next appointment)    Specialty:  Cardiology   Contact information:   [address] [place] [address] [phone]       Follow up with Ronal Fear, NP On 06/03/2013. (appt at 3 pm)    Specialty:  Nurse Practitioner   Contact information:   [address] [place] [address] [phone]         Total Time spent on discharge equals 45 minutes.  SignedJeoffrey Massed 05/30/2013 4:42 PM

## 2013-05-30 NOTE — Progress Notes (Signed)
NEURO HOSPITALIST PROGRESS NOTE   SUBJECTIVE:                                                                                                                        Feeling better this morning. Stated that she is not cognitively doing well after her stroke and wants to pursue further therapy.  No further episodes of alteration of consciousness noted. EEG is normal. MRI brain pending.  OBJECTIVE:                                                                                                                           Vital signs in last 24 hours: Temp:  [97.5 F (36.4 C)-98.9 F (37.2 C)] 97.5 F (36.4 C) (08/15 0808) Pulse Rate:  [48-69] 48 (08/15 0808) Resp:  [15-22] 18 (08/15 0654) BP: (113-129)/(57-97) 119/57 mmHg (08/15 0808) SpO2:  [98 %-100 %] 100 % (08/15 0808) Weight:  [69.4 kg (153 lb)] 69.4 kg (153 lb) (08/14 2252)  Intake/Output from previous day:   Intake/Output this shift: Total I/O In: 240 [P.O.:240] Out: -  Nutritional status: Cardiac  Past Medical History  Diagnosis Date  . IBS (irritable bowel syndrome)     mixed  . GERD (gastroesophageal reflux disease)   . Allergic rhinitis   . Asthma   . Eczema   . Sleep apnea     cpap setting of 16, humidity setting 34f 4% sleep study 07-10-2010 epic  . Rheumatoid arthritis(714.0) 2010    oa and ra  . Nephrolithiasis     current kidney stones  . Plaque psoriasis   . Cerebral vascular malformation     sees dr Lovell Sheehan for monitoring as needed, sees dr lewitt for headaches every 4 months  . Lactose intolerance   . PONV (postoperative nausea and vomiting)     also itching  . Anxiety   . Depression   . Stroke 03/26/2013      Neurologic Exam:  Mental Status:  Alert, oriented, thought content appropriate. Speech fluent without evidence of aphasia. Able to follow 3 step commands without difficulty.  Cranial Nerves:  II: Discs flat bilaterally; Visual fields grossly normal  with some peripheral vision loss to the far left, pupils equal, round, reactive to  light and accommodation  III,IV, VI: ptosis not present, extra-ocular motions intact bilaterally  V,VII: smile symmetric, facial light touch sensation decreased on the left  VIII: hearing normal bilaterally  IX,X: gag reflex present  XI: bilateral shoulder shrug  XII: midline tongue extension  Motor:  Right : Upper extremity 5/5 Left: Upper extremity 5/5  Lower extremity 5/5 Lower extremity 5/5  Patient has bilateral give-way weakness but no focality  Sensory: Pinprick and light touch decreased on the left  Deep Tendon Reflexes: 2+ and symmetric throughout  Plantars:  Right: downgoing Left: downgoing  Cerebellar:  normal finger-to-nose and normal heel-to-shin test  Gait: no tested CV: pulses palpable throughout    Lab Results: Lab Results  Component Value Date/Time   CHOL 139 03/27/2013  5:40 AM   Lipid Panel No results found for this basename: CHOL, TRIG, HDL, CHOLHDL, VLDL, LDLCALC,  in the last 72 hours  Studies/Results: Dg Chest 2 View  05/29/2013   *RADIOLOGY REPORT*  Clinical Data: Chest pain  CHEST - 2 VIEW  Comparison: 03/26/2013  Findings: The cardiac shadow is stable.  The lungs are well-aerated bilaterally.  No focal infiltrate is seen.  No acute bony abnormality is noted.  IMPRESSION: No acute abnormalities seen.   Original Report Authenticated By: Alcide Clever, M.D.   Ct Head Wo Contrast  05/29/2013   *RADIOLOGY REPORT*  Clinical Data: Altered mental status and chest pain  CT HEAD WITHOUT CONTRAST  Technique:  Contiguous axial images were obtained from the base of the skull through the vertex without contrast.  Comparison: Head CT 03/26/2013 and MR brain 03/26/2013  Findings: Focal encephalomalacia in the right parietal lobe is consistent with the known parietal lobe infarct described on MRI brain 03/26/2013.  There is a stable hyperdensity in the right frontal lobe measuring 1 cm that has  be characterized as a cavernoma on prior MRI.  No acute cortically based infarction is identified.  Negative for acute hemorrhage or midline shift.  The ventricles are normal in size.  Left maxillary sinus polyp or mucosal retention cyst is noted. Hyperdensity within a right nasal turbinate is unchanged.  IMPRESSION:  1.  No acute intracranial abnormality is identified. 2.  Encephalomalacia in the right parietal lobe is compatible with the parietal lobe infarct that occurred in June 2014. 3.  Stable right frontal lobe cavernoma.   Original Report Authenticated By: Britta Mccreedy, M.D.    MEDICATIONS                                                                                                                       I have reviewed the patient's current medications.  ASSESSMENT/PLAN:  51 y/o with recent stroke admitted after sustaining a transient episode of alteration of consciousness. Neuro-exam is not particularly impressive and EEG normal. MRI brain pending. She has an area of encephalomalacia from prior stroke which could predispose her to have seizures but at this moment we are not quite convinced that she requires anti-seizure medications. Will wait for results MRI brain. She and her husband are asking about getting cognitive therapy and I explained to them that this is something that needs to be done as outpatient. Will follow up after MRI.  Wyatt Portela, MD [facility] [phone]  05/30/2013, 11:31 AM

## 2013-05-30 NOTE — ED Provider Notes (Signed)
Medical screening examination/treatment/procedure(s) were conducted as a shared visit with resident-physician practitioner(s) and myself.  I personally evaluated the patient during the encounter.  Pt is a 52 y.o. female with pmhx as above including recent CVA presenting with sudden onset h/a with L facial droop, L sided weakness that improved after arrival.  On my exam, mild dec strength LUE/LLE c/w prior CVA.  Pt found to have no acute CT changes.  Neuro consulted & pt wil be admitted to medicine serivce for TIA w/u.      Shanna Cisco, MD 05/30/13 1043

## 2013-05-30 NOTE — Progress Notes (Signed)
EEG Completed; Results Pending  

## 2013-05-30 NOTE — Procedures (Signed)
EEG report.  Brief clinical history:52 year old female recently admitted for an acute infarct in June who now returns after an episode of poor responsiveness No prior history of frank epileptic seizures.  Technique: this is a 17 channel routine scalp EEG performed at the bedside with bipolar and monopolar montages arranged in accordance to the international 10/20 system of electrode placement. One channel was dedicated to EKG recording.  The study was performed during wakefulness and drowsiness. No activating procedures performed.  Description:In the wakeful state, the best background consisted of a medium amplitude, posterior dominant, well sustained, symmetric and reactive 11Hz  rhythm. Drowsiness demonstrated dropout of the alpha rhythm. No focal or generalized epileptiform discharges noted.  No slowing seen.  EKG showed sinus rhythm.  Impression: this is a normal awake and drowsy EEG. Please, be aware that a normal EEG does not exclude the possibility of epilepsy.  Clinical correlation is advised.  Wyatt Portela, MD

## 2013-05-30 NOTE — Care Management Note (Signed)
    Page 1 of 1   05/30/2013     4:04:46 PM   CARE MANAGEMENT NOTE 05/30/2013  Patient:  [name]   Account Number:  [id number]  Date Initiated:  05/30/2013  Documentation initiated by:  Donn Pierini  Subjective/Objective Assessment:   Pt admitted with HA, ?Sz, c/p     Action/Plan:   PTA pt lived at home with spouse   Anticipated DC Date:  05/30/2013   Anticipated DC Plan:  HOME/SELF CARE      DC Planning Services  CM consult  OP Neuro Rehab      Choice offered to / List presented to:             Status of service:  Completed, signed off Medicare Important Message given?   (If response is "NO", the following Medicare IM given date fields will be blank) Date Medicare IM given:   Date Additional Medicare IM given:    Discharge Disposition:  HOME/SELF CARE  Per UR Regulation:  Reviewed for med. necessity/level of care/duration of stay  If discussed at Long Length of Stay Meetings, dates discussed:    Comments:  05/30/13- 1500- Donn Pierini RN, BSN [phone] Asked to speak with pt and spouse regarding HH needs per bedside RN- in to speak with pt- who states that spouse has stepped out for lunch asks for CM to return to speak with spouse- returned to speak with spouse at bedside- per conversation he states that pt was active with [facility] after d/c from hospital in June for PT/OT/ST- was then discharged from services under his impression due to pt meeting goals and MD no longer signing orders (per [facility]-pt requested to stop services) upon discussing needs with husband pt gets around with [name] cane is able to go her ADLs- her main problem is with cognitive issues- explained that [facility] would not help with cognitive deficits and possibly outpt therapy may be the option to look at-  spouse agreeable to referral to outpt neuro rehab for cognitive therapy-  verbal order given per neuro MD and form faxed to Neuro rehab- info on location and phone # given to spouse for f/u. Pt has all needed DME  at home. No further CM needs.

## 2013-05-30 NOTE — Progress Notes (Signed)
PATIENT DETAILS Name: [name] Age: 52 y.o. Sex: female Date of Birth: Jul 23, 1961 Admit Date: 05/29/2013 Admitting Physician Tarry Kos, MD [id number],IONG Sherilyn Cooter, MD  Subjective: 30 awake and alert, no further chest pain or headaches.  Assessment/Plan: Principal Problem: Transient episode of altered consciousness - Etiology uncertain, workup in progress. Given prior history of CVA and resultant encephalomalacia-seizures remain in the differential. However EEG is showing, await MRI of the brain. - Neurology following.  Active Problems:  Chest Pain - Highly atypical - Cardiac enzymes negative so far - Recent outpatient stress test done by Dr. Jacinto Halim- was negative for ischemia (spoke to Dr. Jacinto Halim)  Headache - Resolved - Does apparently have a history of migraine headaches - Resumed Topamax and as needed tramadol    CVA (cerebral infarction) history - On aspirin, very minimal left-sided deficits - Follows with Dr. Pearlean Brownie as an outpatient    Patent foramen ovale - Outpatient follow up with Dr. Pearlean Brownie  History of rheumatoid arthritis - Resume Remicade and methotrexate on discharge  GERD - PPI  Obstructive sleep apnea - CPAP  Disposition: Remain inpatient  DVT Prophylaxis: Prophylactic Lovenox   Code Status: Full code   Family Communication None at bedside  Procedures:  None  CONSULTS:  neurology   MEDICATIONS: Scheduled Meds: . aspirin  81 mg Oral Daily  . citalopram  20 mg Oral Daily  . gabapentin  600 mg Oral BID  . pantoprazole  40 mg Oral Daily  . sodium chloride  3 mL Intravenous Q12H  . sodium chloride  3 mL Intravenous Q12H  . verapamil  40 mg Oral QHS   Continuous Infusions:  PRN Meds:.sodium chloride, acetaminophen, acetaminophen, alum & mag hydroxide-simeth, ondansetron (ZOFRAN) IV, ondansetron, oxyCODONE-acetaminophen, sodium chloride  Antibiotics: Anti-infectives   None       PHYSICAL EXAM: Vital signs in last 24  hours: Filed Vitals:   05/30/13 0500 05/30/13 0654 05/30/13 0808 05/30/13 1300  BP: 121/58 113/68 119/57 121/63  Pulse: 54 57 48 64  Temp: 97.7 F (36.5 C) 97.8 F (36.6 C) 97.5 F (36.4 C) 98.2 F (36.8 C)  TempSrc: Oral Oral Oral Oral  Resp: 16 18  18   Height:      Weight:      SpO2: 100% 100% 100% 100%    Weight change:  Filed Weights   05/29/13 1737 05/29/13 2252  Weight: 69.4 kg (153 lb) 69.4 kg (153 lb)   Body mass index is 25.46 kg/(m^2).   Gen Exam: Awake and alert with clear speech.   Neck: Supple, No JVD.   Chest: B/L Clear.   CVS: S1 S2 Regular, no murmurs.  Abdomen: soft, BS +, non tender, non distended.  Extremities: no edema, lower extremities warm to touch. Neurologic: Non Focal.   Skin: No Rash.   Wounds: N/A.    Intake/Output from previous day:  Intake/Output Summary (Last 24 hours) at 05/30/13 1327 Last data filed at 05/30/13 1311  Gross per 24 hour  Intake    600 ml  Output    500 ml  Net    100 ml     LAB RESULTS: CBC  Recent Labs Lab 05/29/13 1808 05/30/13 0445  WBC 9.2 7.0  HGB 13.4 12.7  HCT 37.1 36.5  PLT 237 201  MCV 92.5 93.6  MCH 33.4 32.6  MCHC 36.1* 34.8  RDW 13.0 13.2  LYMPHSABS 2.8  --   MONOABS 0.8  --   EOSABS 0.1  --   BASOSABS 0.0  --  Chemistries   Recent Labs Lab 05/29/13 1808 05/30/13 0445  NA 142 142  K 3.7 3.9  CL 110 112  CO2 22 21  GLUCOSE 104* 101*  BUN 15 13  CREATININE 0.79 0.50  CALCIUM 9.8 9.1    CBG: No results found for this basename: GLUCAP,  in the last 168 hours  GFR Estimated Creatinine Clearance: 78.7 ml/min (by C-G formula based on Cr of 0.5).  Coagulation profile  Recent Labs Lab 05/29/13 1808  INR 1.01    Cardiac Enzymes  Recent Labs Lab 05/29/13 2349 05/30/13 0749  TROPONINI <0.30 <0.30    No components found with this basename: POCBNP,  No results found for this basename: DDIMER,  in the last 72 hours No results found for this basename: HGBA1C,  in  the last 72 hours No results found for this basename: CHOL, HDL, LDLCALC, TRIG, CHOLHDL, LDLDIRECT,  in the last 72 hours No results found for this basename: TSH, T4TOTAL, FREET3, T3FREE, THYROIDAB,  in the last 72 hours No results found for this basename: VITAMINB12, FOLATE, FERRITIN, TIBC, IRON, RETICCTPCT,  in the last 72 hours No results found for this basename: LIPASE, AMYLASE,  in the last 72 hours  Urine Studies No results found for this basename: UACOL, UAPR, USPG, UPH, UTP, UGL, UKET, UBIL, UHGB, UNIT, UROB, ULEU, UEPI, UWBC, URBC, UBAC, CAST, CRYS, UCOM, BILUA,  in the last 72 hours  MICROBIOLOGY: Recent Results (from the past 240 hour(s))  MRSA PCR SCREENING     Status: None   Collection Time    05/30/13 12:14 AM      Result Value Range Status   MRSA by PCR NEGATIVE  NEGATIVE Final   Comment:            The GeneXpert MRSA Assay (FDA     approved for NASAL specimens     only), is one component of a     comprehensive MRSA colonization     surveillance program. It is not     intended to diagnose MRSA     infection nor to guide or     monitor treatment for     MRSA infections.    RADIOLOGY STUDIES/RESULTS: Dg Chest 2 View  05/29/2013   *RADIOLOGY REPORT*  Clinical Data: Chest pain  CHEST - 2 VIEW  Comparison: 03/26/2013  Findings: The cardiac shadow is stable.  The lungs are well-aerated bilaterally.  No focal infiltrate is seen.  No acute bony abnormality is noted.  IMPRESSION: No acute abnormalities seen.   Original Report Authenticated By: Alcide Clever, M.D.   Ct Head Wo Contrast  05/29/2013   *RADIOLOGY REPORT*  Clinical Data: Altered mental status and chest pain  CT HEAD WITHOUT CONTRAST  Technique:  Contiguous axial images were obtained from the base of the skull through the vertex without contrast.  Comparison: Head CT 03/26/2013 and MR brain 03/26/2013  Findings: Focal encephalomalacia in the right parietal lobe is consistent with the known parietal lobe infarct  described on MRI brain 03/26/2013.  There is a stable hyperdensity in the right frontal lobe measuring 1 cm that has be characterized as a cavernoma on prior MRI.  No acute cortically based infarction is identified.  Negative for acute hemorrhage or midline shift.  The ventricles are normal in size.  Left maxillary sinus polyp or mucosal retention cyst is noted. Hyperdensity within a right nasal turbinate is unchanged.  IMPRESSION:  1.  No acute intracranial abnormality is identified. 2.  Encephalomalacia in  the right parietal lobe is compatible with the parietal lobe infarct that occurred in June 2014. 3.  Stable right frontal lobe cavernoma.   Original Report Authenticated By: Britta Mccreedy, M.D.    Jeoffrey Massed, MD  [facility] Pager:[phone]  If 7PM-7AM, please contact night-coverage [email or web address] Password [email or web address] 05/30/2013, 1:27 PM   LOS: 1 day

## 2013-06-03 ENCOUNTER — Encounter: Payer: Self-pay | Admitting: Nurse Practitioner

## 2013-06-03 ENCOUNTER — Ambulatory Visit (INDEPENDENT_AMBULATORY_CARE_PROVIDER_SITE_OTHER): Payer: BC Managed Care – PPO | Admitting: Nurse Practitioner

## 2013-06-03 VITALS — BP 108/68 | HR 58 | Temp 97.3°F | Ht 65.5 in | Wt 153.0 lb

## 2013-06-03 DIAGNOSIS — Z1331 Encounter for screening for depression: Secondary | ICD-10-CM | POA: Insufficient documentation

## 2013-06-03 DIAGNOSIS — R413 Other amnesia: Secondary | ICD-10-CM

## 2013-06-03 DIAGNOSIS — I6350 Cerebral infarction due to unspecified occlusion or stenosis of unspecified cerebral artery: Secondary | ICD-10-CM | POA: Insufficient documentation

## 2013-06-03 DIAGNOSIS — F410 Panic disorder [episodic paroxysmal anxiety] without agoraphobia: Secondary | ICD-10-CM

## 2013-06-03 DIAGNOSIS — Q2112 Patent foramen ovale: Secondary | ICD-10-CM

## 2013-06-03 DIAGNOSIS — R4182 Altered mental status, unspecified: Secondary | ICD-10-CM

## 2013-06-03 DIAGNOSIS — Q2111 Secundum atrial septal defect: Secondary | ICD-10-CM

## 2013-06-03 DIAGNOSIS — Q211 Atrial septal defect: Secondary | ICD-10-CM

## 2013-06-03 DIAGNOSIS — R51 Headache: Secondary | ICD-10-CM | POA: Insufficient documentation

## 2013-06-03 DIAGNOSIS — R519 Headache, unspecified: Secondary | ICD-10-CM | POA: Insufficient documentation

## 2013-06-03 DIAGNOSIS — R6889 Other general symptoms and signs: Secondary | ICD-10-CM

## 2013-06-03 MED ORDER — CITALOPRAM HYDROBROMIDE 20 MG PO TABS: 40.0000 mg | ORAL_TABLET | Freq: Every day | ORAL | Status: DC

## 2013-06-03 MED ORDER — LEVETIRACETAM 250 MG PO TABS: 250.0000 mg | ORAL_TABLET | Freq: Two times a day (BID) | ORAL | Status: DC

## 2013-06-03 MED ORDER — DIVALPROEX SODIUM ER 500 MG PO TB24: 500.0000 mg | ORAL_TABLET | Freq: Every day | ORAL | Status: DC

## 2013-06-03 NOTE — Progress Notes (Addendum)
[facility]  PATIENT: [name] DOB: Feb 20, 1961   HISTORY FROM: patient, chart REASON FOR VISIT: routine follow up  HISTORY OF PRESENT ILLNESS:  05/02/13 (PS): 52 year old Caucasian lady being seen today for the first office followup visit for hospital admission for stroke. She presented with sudden onset of unstable gait and dizziness on 03/25/13. CT scan of the head showed equal local right frontal low-density and subsequently MRI scan of the brain confirmed right parietal acute ischemic infarct. MRA of the brain showed diffuse mild intracranial atherosclerotic changes without any large vessel occlusion. Lipid profile and hemoglobin A1c were normal. MRA of the brain showed a tiny incidental 1.2 mm right cavernous internal carotid artery aneurysm. Transthoracic echo showed normal ejection fraction. Carotid ultrasounds were unremarkable. She was not found to have significant vascular risk factors. Except for sleep apnea for which she does use CPAP mask every night. Extensive lab work done in the hospital included basic metabolic panel, ANA, ESR, complement levels, RPR, hypercoagulable panel all of which were negative. She states that she's been having headaches which are almost daily since discharge. She has been taking tramadol which helps but only for short while. She also complains of some intermittent dizziness and some left-sided incoordination which is improving but is not back to normal.  Returned to hospital on 05/29/13, after being found by her husband on the ground rocking back and forth screaming loudly clutching her chest. At the time pt would not answer her husband, just screaming in pain. Pt does not recall what happened. She thinks she lost 3 hours of time where she cannot recollect what happened. Was found in work up to have a PFO and has been enrolled in a study and followed by Dr.Sethi as an outpatient. Patient reports that at baseline she has some difficulties  with reasoning and ambulates with a cane. Reports daily headaches since that time as well.   UPDATE 06/03/13 (LL):  Patient comes in for stroke follow up since last visit on 05/02/13.  She is wearing the cardiac event monitor, will be finished on Friday, sees Dr Jacinto Halim then.  Headaches are some better, taking 100mg  Topamax daily at bedtime.  Headache is still every day, taking Tramadol 2-4 pills every day, 2 at a time.  Increased problems with adding, calculations, completing tasks, cooking.  Short term memory problems.  Reports trouble breathing at times and feelings of lightheadedness.  Paresthesias in bilateral hands and feel when standing for long periods.  No prior history of breathing difficulty except for mild asthma.  Tearful in office.  Taking daily aspirin for secondary stroke prevention.  Patient denies medication side effects, with no signs of bleeding or excessive bruising.    REVIEW OF SYSTEMS: Full 14 system review of systems performed and notable only for: constitutional: N/A  cardiovascular: N/A respiratory: N/A endocrine: N/A  ear/nose/throat: N/A  Hematology/Lymph: N/A musculoskeletal: N/A skin: N/A genitourinary: N/A Gastrointestinal: N/A allergy/immunology: N/A neurological: N/A sleep: N/A psychiatric: N/A   ALLERGIES: Allergies  Allergen Reactions  . Amoxicillin     REACTION: GI upset  . Aspirin     REACTION: severe gi upset  . Ciprofloxacin     REACTION: angioedema, urticaria  . Cosyntropin     REACTION: swelling, hives  . Guaifenesin     REACTION: head rash  . Moxifloxacin Other (See Comments)    gi upset  . Nsaids Other (See Comments)    Gi upset  . Sulfonamide Derivatives     REACTION:  swelling, tongue and hives    HOME MEDICATIONS: Outpatient Prescriptions Prior to Visit  Medication Sig Dispense Refill  . ALPRAZolam (XANAX) 0.5 MG tablet Take 0.5 mg by mouth 2 (two) times daily as needed.       Marland Kitchen aspirin 81 MG chewable tablet Chew 1 tablet (81  mg total) by mouth daily.  30 tablet  0  . citalopram (CELEXA) 20 MG tablet Take 20 mg by mouth daily.      . clobetasol ointment (TEMOVATE) 0.05 % Apply 1 application topically 2 (two) times daily.       . CycloSPORINE (RESTASIS OP) Apply 4 mLs to eye 2 (two) times daily.      . folic acid (FOLVITE) 1 MG tablet Take 1 mg by mouth daily.      Marland Kitchen gabapentin (NEURONTIN) 300 MG capsule Take 600 mg by mouth 2 (two) times daily.       . InFLIXimab (REMICADE IV) Inject 3 mg/kg into the vein every 8 (eight) weeks.      Marland Kitchen loratadine (CLARITIN) 10 MG tablet Take 10 mg by mouth daily.      . Methotrexate Sodium, PF, 100 MG/4ML SOLN Inject 4 mLs as directed. Once a week      . Multiple Vitamin (MULTIVITAMIN) capsule Take 1 capsule by mouth daily.       . NON FORMULARY Place 1 spray into the nose 2 (two) times daily as needed. Lidocaine Spray.  Use as directed for headaches.      . ondansetron (ZOFRAN) 4 MG tablet Take 1 tablet (4 mg total) by mouth every 8 (eight) hours as needed for nausea.  20 tablet  0  . pantoprazole (PROTONIX) 40 MG tablet Take 1 tablet (40 mg total) by mouth daily.  30 tablet  3  . pravastatin (PRAVACHOL) 20 MG tablet Take 20 mg by mouth daily.      Marland Kitchen PRESCRIPTION MEDICATION Inject 10 mg into the muscle every Friday. Methotrexate 25 mg/ml.  10 mg = 0.4 ml      . topiramate (TOPAMAX) 50 MG tablet Take 1 tablet (50 mg total) by mouth 2 (two) times daily. Start one tablet daily at night x 1 week then twice daily if tolerated  100 tablet  1  . traMADol (ULTRAM) 50 MG tablet Take 50-100 mg by mouth 2 (two) times daily as needed. For pain      . VENTOLIN HFA 108 (90 BASE) MCG/ACT inhaler Inhale 2 puffs into the lungs every 6 (six) hours as needed.       . verapamil (CALAN) 80 MG tablet Take 40 mg by mouth at bedtime. Takes 1/2 of 80 mg tab at hs       No facility-administered medications prior to visit.    PAST MEDICAL HISTORY: Past Medical History  Diagnosis Date  . IBS (irritable  bowel syndrome)     mixed  . GERD (gastroesophageal reflux disease)   . Allergic rhinitis   . Asthma   . Eczema   . Sleep apnea     cpap setting of 16, humidity setting 69f 4% sleep study 07-10-2010 epic  . Rheumatoid arthritis(714.0) 2010    oa and ra  . Nephrolithiasis     current kidney stones  . Plaque psoriasis   . Cerebral vascular malformation     sees dr Lovell Sheehan for monitoring as needed, sees dr lewitt for headaches every 4 months  . Lactose intolerance   . PONV (postoperative nausea and vomiting)  also itching  . Anxiety   . Depression   . Stroke 03/26/2013    PAST SURGICAL HISTORY: Past Surgical History  Procedure Laterality Date  . Incontinence surgery  2010    sling done   . Knee arthroscopy  1992    left  . Foot surgery  1999    right ankle  . Kidney stone surgery  2001  . Appendectomy  1981  . Cholecystectomy  1981  . Tubal ligation  1990  . Total abdominal hysterectomy  2001  . Hernia repair  2006  . Left foot plating and scarping for arthritis  2011  . Right ear tube insertion  2011  . Incisional hernia repair  05/20/2012    Procedure: HERNIA REPAIR INCISIONAL;  Surgeon: Clovis Pu. Cornett, MD;  Location: [facility] ORS;  Service: General;  Laterality: N/A;  . Tee without cardioversion N/A 05/15/2013    Procedure: TRANSESOPHAGEAL ECHOCARDIOGRAM (TEE);  Surgeon: Pamella Pert, MD;  Location: [facility] ENDOSCOPY;  Service: Cardiovascular;  Laterality: N/A;    FAMILY HISTORY: Family History  Problem Relation Age of Onset  . Diabetes Maternal Grandmother   . Arthritis Maternal Grandmother   . Diabetes Brother   . Diabetes Paternal Grandmother   . Diabetes Maternal Grandfather   . Diabetes Paternal Grandfather   . Heart disease Brother   . Heart disease Other     Uncle  . Breast cancer Maternal Aunt   . Ovarian cancer Mother     SOCIAL HISTORY: History   Social History  . Marital Status: Married    Spouse Name: Onalee Hua    Number of Children: 3  . Years  of Education: College   Occupational History  . Production assistant, radio   .  [employer]   Social History Main Topics  . Smoking status: Former Smoker -- 2.00 packs/day for 30 years    Types: Cigarettes    Quit date: 10/16/2000  . Smokeless tobacco: Never Used  . Alcohol Use: Yes     Comment: rare  . Drug Use: No  . Sexual Activity: Not on file   Other Topics Concern  . Not on file   Social History Narrative   Patient lives at home with family.   Caffeine Use: 1.5 cup of coffee in a.m     PHYSICAL EXAM  Filed Vitals:   06/03/13 1518  Height: 5' 5.5" (1.664 m)  Weight: 153 lb (69.4 kg)   Body mass index is 25.06 kg/(m^2).  Physical Exam  General: well developed, well nourished, seated Head: head normocephalic and atraumatic. Orohparynx benign  Neck: supple with no carotid or supraclavicular bruits  Cardiovascular: regular rate and rhythm, no murmurs  Musculoskeletal: no deformity  Skin: no rash/petichiae   Vascular: Normal pulses all extremities  Neurologic Exam  Mental Status: Awake and fully alert. Oriented to place and time. Recent and remote memory intact. Attention span, concentration and fund of knowledge appropriate. Mood and affect are  anxious, tearful.  MMSE 23/30 with deficits in attention and calculation. Recall 1/3. Clock drawing 4/4.  AFT 7 (12+ normal).  Geriatric Depression Score 8, indicates depression. Cranial Nerves: Fundoscopic exam reveals sharp disc margins. Pupils equal, briskly reactive to light. Extraocular movements full without nystagmus. Visual fields full to confrontation. Hearing intact. Facial sensation intact. Face, tongue, palate moves normally and symmetrically.  Motor: Normal bulk and tone. Normal strength in all tested extremity muscles.Diminished fine finger movements on left and orbits right over  left upper extremity. Patient has bilateral give-way weakness but no focality  Sensory: intact to tough and pinprick  and vibratory.  Coordination: Rapid alternating movements normal in all extremities. Finger-to-nose and heel-to-shin performed accurately bilaterally.  Gait and Station: Arises from chair without difficulty. Stance is normal. Gait demonstrates slow, normal stride length and balance. Able to heel, toe and tandem walk with mild difficulty. Using cane for balance. Romberg negative. Reflexes: 1+ and symmetric. Toes downgoing.  DIAGNOSTIC DATA (LABS, IMAGING, TESTING) - I reviewed patient records, labs, notes, testing and imaging myself where available.  Lab Results  Component Value Date   WBC 7.0 05/30/2013   HGB 12.7 05/30/2013   HCT 36.5 05/30/2013   MCV 93.6 05/30/2013   PLT 201 05/30/2013      Component Value Date/Time   NA 142 05/30/2013 0445   K 3.9 05/30/2013 0445   CL 112 05/30/2013 0445   CO2 21 05/30/2013 0445   GLUCOSE 101* 05/30/2013 0445   BUN 13 05/30/2013 0445   CREATININE 0.50 05/30/2013 0445   CALCIUM 9.1 05/30/2013 0445   PROT 7.0 05/10/2012 1350   ALBUMIN 3.8 05/10/2012 1350   AST 15 05/10/2012 1350   ALT 10 05/10/2012 1350   ALKPHOS 82 05/10/2012 1350   BILITOT 0.2* 05/10/2012 1350   GFRNONAA >90 05/30/2013 0445   GFRAA >90 05/30/2013 0445   Lab Results  Component Value Date   CHOL 139 03/27/2013   HDL 23* 03/27/2013   LDLCALC 89 03/27/2013   TRIG 135 03/27/2013   CHOLHDL 6.0 03/27/2013   Lab Results  Component Value Date   HGBA1C 5.6 03/27/2013   TCD 05/01/13 Positive Transcranial Doppler Bubble Study indicative of moderate size right to left intracardiac or intrapulmonary shunt.  TEE 05/15/13 Large PFO with strongly positive double contrast for bidirectional shunting both at rest and Valsalva. Marland Kitchen PFO measures 5mm and tunnel length 15 mm. Otherwise normal TEE. Ct Head Wo Contrast 05/29/2013  No acute intracranial abnormality is identified. Encephalomalacia in the right parietal lobe is compatible with the parietal lobe infarct that occurred in June 2014. Stable right frontal lobe  cavernoma.  EEG ADULT 05/30/13  This is a normal awake and drowsy EEG. Please, be aware that a normal EEG does not exclude the possibility of epilepsy. Clinical correlation is advised.   Mr Brain Wo Contrast  05/30/2013  Expected evolutionary change related to chronic right parietal infarct. No new infarction is seen. Unchanged right frontal cavernoma.     ASSESSMENT AND PLAN Ms. MILANNI AYUB is a 52 y.o. female presenting with dizziness, weakness and headache in June 2014 from acute non hemorrhagic small to moderate size right parietal lobe infarct felt to be embolic.  Significant vascular risk factors are sleep apnea and large PFO confirmed by bubble study and TEE.  Cardiac event monitor pending, completion on Friday. Headaches post stroke likely mixed migraine and tension headaches. Cognitive problems likely compounded by situational depression and anxiety post-stroke.  Continue aspirin 81 mg orally every day  for secondary stroke prevention and maintain strict control of hypertension with blood pressure goal below 130/90, diabetes with hemoglobin A1c goal below 6.5% and lipids with LDL cholesterol goal below 100 mg/dL.  Continue Topamax for headaches begin 50 mg at night for one week increase if tolerated to twice daily. Continue to use tramadol as needed but limit to not more than 3 days a week to avoid rebound.  I discussed with the patient possible participation in the [place] PFO closure  trial, follow visit with Lorenza Burton.  Increase Celexa to 40 mg daily (2 tablets daily) for depression. Start Depakote ER 500 mg, 1 tablet daily, for possible recent seizure and mood disorder. Repeat EEG. Referral to Neuropsychology for Neuro-Cognitive Testing. Letter excusing patient from next 1 month of work given at this visit. Followup in 1 month.   Valproic Acid Level on 01/14/14 was normal- 50.6.  Mckenzye Cutright NP-C 06/03/2013, 3:24 PM  [facility] [address] [place], [place] [address] [phone]  I have personally examined this patient, reviewed pertinent data, developed plan of care and discussed with patient and agree with above.  Delia Heady, MD

## 2013-06-03 NOTE — Patient Instructions (Addendum)
Continue aspirin 81 mg orally every day  for secondary stroke prevention and maintain strict control of hypertension with blood pressure goal below 130/90, diabetes with hemoglobin A1c goal below 6.5% and lipids with LDL cholesterol goal below 100 mg/dL.   Continue Topamax for headaches begin 50 mg at night for one week increase if tolerated to twice daily. Continue to use tramadol as needed but limit to not more than 3 days a week to avoid rebound.   I discussed with the patient possible participation in the [place] PFO closure trial, follow visit with Lorenza Burton.   Increase Celexa to 40 mg daily (2 tablets daily.)  Start Depakote ER 500 mg, 1 tablet daily. Repeat EEG. Neuropsychological testing recently ordered.  Followup in 1 month.

## 2013-06-10 ENCOUNTER — Other Ambulatory Visit (INDEPENDENT_AMBULATORY_CARE_PROVIDER_SITE_OTHER): Payer: BC Managed Care – PPO

## 2013-06-10 DIAGNOSIS — R4182 Altered mental status, unspecified: Secondary | ICD-10-CM

## 2013-06-10 DIAGNOSIS — R42 Dizziness and giddiness: Secondary | ICD-10-CM

## 2013-06-10 DIAGNOSIS — I6350 Cerebral infarction due to unspecified occlusion or stenosis of unspecified cerebral artery: Secondary | ICD-10-CM

## 2013-06-11 ENCOUNTER — Telehealth: Payer: Self-pay

## 2013-06-11 NOTE — Telephone Encounter (Signed)
I called and spoke with patient. I will leave forms up front. She will have to sign a release of information when she picks them up.

## 2013-06-23 ENCOUNTER — Telehealth: Payer: Self-pay | Admitting: *Deleted

## 2013-06-30 ENCOUNTER — Encounter (INDEPENDENT_AMBULATORY_CARE_PROVIDER_SITE_OTHER): Payer: Self-pay

## 2013-06-30 ENCOUNTER — Other Ambulatory Visit: Payer: Self-pay | Admitting: Nurse Practitioner

## 2013-06-30 DIAGNOSIS — Z0289 Encounter for other administrative examinations: Secondary | ICD-10-CM

## 2013-07-01 NOTE — Progress Notes (Signed)
Quick Note:  I called and relayed to her the EEG normal. She stated this was good news. ______

## 2013-07-03 ENCOUNTER — Other Ambulatory Visit: Payer: Self-pay | Admitting: Nurse Practitioner

## 2013-07-03 DIAGNOSIS — Q211 Atrial septal defect: Secondary | ICD-10-CM

## 2013-07-03 DIAGNOSIS — I6350 Cerebral infarction due to unspecified occlusion or stenosis of unspecified cerebral artery: Secondary | ICD-10-CM

## 2013-07-03 DIAGNOSIS — Q2112 Patent foramen ovale: Secondary | ICD-10-CM

## 2013-07-04 ENCOUNTER — Ambulatory Visit: Payer: Self-pay | Admitting: Nurse Practitioner

## 2013-08-11 ENCOUNTER — Other Ambulatory Visit: Payer: Self-pay

## 2013-08-11 DIAGNOSIS — R51 Headache: Secondary | ICD-10-CM

## 2013-08-11 MED ORDER — TOPIRAMATE 50 MG PO TABS: 50.0000 mg | ORAL_TABLET | Freq: Two times a day (BID) | ORAL | Status: DC

## 2013-08-25 ENCOUNTER — Encounter (INDEPENDENT_AMBULATORY_CARE_PROVIDER_SITE_OTHER): Payer: Self-pay

## 2013-08-25 DIAGNOSIS — Z0289 Encounter for other administrative examinations: Secondary | ICD-10-CM

## 2013-08-29 ENCOUNTER — Other Ambulatory Visit: Payer: Self-pay | Admitting: Neurology

## 2013-08-29 MED ORDER — DIVALPROEX SODIUM ER 500 MG PO TB24: 500.0000 mg | ORAL_TABLET | Freq: Every day | ORAL | Status: DC

## 2013-09-09 ENCOUNTER — Ambulatory Visit: Payer: BC Managed Care – PPO | Admitting: Pulmonary Disease

## 2013-09-19 ENCOUNTER — Other Ambulatory Visit: Payer: Self-pay | Admitting: Cardiology

## 2013-10-08 ENCOUNTER — Encounter: Payer: Self-pay | Admitting: Pulmonary Disease

## 2013-10-08 ENCOUNTER — Ambulatory Visit (INDEPENDENT_AMBULATORY_CARE_PROVIDER_SITE_OTHER): Payer: BC Managed Care – PPO | Admitting: Pulmonary Disease

## 2013-10-08 VITALS — BP 116/58 | HR 72 | Temp 97.5°F | Ht 65.0 in | Wt 156.0 lb

## 2013-10-08 DIAGNOSIS — G4733 Obstructive sleep apnea (adult) (pediatric): Secondary | ICD-10-CM

## 2013-10-08 NOTE — Patient Instructions (Signed)
Will get you to the sleep center for a formal mask fitting session. Will have your home care company put your machine on the auto setting for a few weeks to re-optimize your pressure. followup with me in one year, but please call if things are not going well.

## 2013-10-08 NOTE — Progress Notes (Signed)
   Subjective:    Patient ID: [name], female    DOB: 1961/03/28, 52 y.o.   MRN: [id number]  HPI Patient comes in today for followup of her obstructive sleep apnea. She had a recent stroke this summer, and tells me that her mask is not fitting well since that time. She also tells me that she feels the pressure is not set correctly either. She has had a difficult time sleeping with the device, and therefore her sleep has been poor. It should be noted that her weight is down about 10 pounds since last visit.   Review of Systems  Constitutional: Negative for fever and unexpected weight change.  HENT: Positive for sinus pressure. Negative for congestion, dental problem, ear pain, nosebleeds, postnasal drip, rhinorrhea, sneezing, sore throat and trouble swallowing.   Eyes: Negative for redness and itching.  Respiratory: Positive for shortness of breath. Negative for cough, chest tightness and wheezing.   Cardiovascular: Positive for palpitations. Negative for leg swelling.  Gastrointestinal: Positive for nausea. Negative for vomiting.  Genitourinary: Negative for dysuria.  Musculoskeletal: Negative for joint swelling.  Skin: Negative for rash.  Neurological: Negative for headaches.  Hematological: Does not bruise/bleed easily.  Psychiatric/Behavioral: Negative for dysphoric mood. The patient is not nervous/anxious.        Objective:   Physical Exam Well-developed female in no acute distress Nose without purulence or discharge noted No skin breakdown or pressure necrosis from the CPAP mask Neck without lymphadenopathy or thyromegaly Lower extremities with no significant edema, no cyanosis Alert, does not appear to be sleepy, moves all 4 extremities.       Assessment & Plan:

## 2013-10-08 NOTE — Assessment & Plan Note (Signed)
The patient is having difficulties with mask fit and pressure since her stroke this past summer. This is not an unusual finding, and we will need to work on both of these issues. I will schedule her for a mask fitting session at the sleep Center, and will also optimize her pressure again on the automatic setting. She is to call us if she is not doing well with this.

## 2013-10-14 ENCOUNTER — Ambulatory Visit (HOSPITAL_BASED_OUTPATIENT_CLINIC_OR_DEPARTMENT_OTHER): Payer: BC Managed Care – PPO | Attending: Pulmonary Disease | Admitting: Radiology

## 2013-10-14 DIAGNOSIS — G4733 Obstructive sleep apnea (adult) (pediatric): Secondary | ICD-10-CM

## 2013-10-23 ENCOUNTER — Telehealth: Payer: Self-pay | Admitting: Pulmonary Disease

## 2013-10-23 DIAGNOSIS — G4733 Obstructive sleep apnea (adult) (pediatric): Secondary | ICD-10-CM

## 2013-10-23 NOTE — Telephone Encounter (Signed)
lmomtcb x1 for pt 

## 2013-10-24 NOTE — Telephone Encounter (Signed)
I spoke with the pt and she states that she had a face mask fitting at the sleep center. She states they were supposed to send over recs on what mask was best for her so we could send an order to her DME [facility]. Please advise if you have received this paperwork and what mask we need to order for the pt.  Carron Curie, CMA

## 2013-10-24 NOTE — Telephone Encounter (Signed)
These records are in [facility] Have been printed and given to [facility] to review.   Please advise Dr Shelle Iron on mask. Thanks.

## 2013-10-24 NOTE — Telephone Encounter (Signed)
The pt needs a fisher paykel simplus full face mask sized small. Please send this to her dme.  Thanks.

## 2013-10-27 NOTE — Telephone Encounter (Signed)
Order placed. Anjali Manzella, CMA  

## 2013-10-31 ENCOUNTER — Other Ambulatory Visit: Payer: Self-pay | Admitting: Cardiology

## 2013-10-31 DIAGNOSIS — J45909 Unspecified asthma, uncomplicated: Secondary | ICD-10-CM

## 2013-11-03 ENCOUNTER — Ambulatory Visit (INDEPENDENT_AMBULATORY_CARE_PROVIDER_SITE_OTHER): Payer: BC Managed Care – PPO | Admitting: Internal Medicine

## 2013-11-03 DIAGNOSIS — R0609 Other forms of dyspnea: Secondary | ICD-10-CM

## 2013-11-03 DIAGNOSIS — R0989 Other specified symptoms and signs involving the circulatory and respiratory systems: Secondary | ICD-10-CM

## 2013-11-03 LAB — PULMONARY FUNCTION TEST
DL/VA % PRED: 79 %
DL/VA: 3.83 ml/min/mmHg/L
DLCO UNC % PRED: 69 %
DLCO UNC: 16.82 ml/min/mmHg
FEF 25-75 POST: 4.5 L/s
FEF 25-75 Pre: 2.41 L/sec
FEF2575-%Change-Post: 86 %
FEF2575-%PRED-POST: 174 %
FEF2575-%PRED-PRE: 93 %
FEV1-%Change-Post: 24 %
FEV1-%PRED-PRE: 77 %
FEV1-%Pred-Post: 96 %
FEV1-POST: 2.6 L
FEV1-Pre: 2.08 L
FEV1FVC-%Change-Post: 3 %
FEV1FVC-%Pred-Pre: 104 %
FEV6-%CHANGE-POST: 20 %
FEV6-%PRED-POST: 90 %
FEV6-%Pred-Pre: 75 %
FEV6-PRE: 2.51 L
FEV6-Post: 3.02 L
FEV6FVC-%PRED-POST: 103 %
FEV6FVC-%Pred-Pre: 103 %
FVC-%CHANGE-POST: 20 %
FVC-%PRED-POST: 87 %
FVC-%Pred-Pre: 72 %
FVC-POST: 3.02 L
FVC-Pre: 2.51 L
POST FEV1/FVC RATIO: 86 %
POST FEV6/FVC RATIO: 100 %
PRE FEV1/FVC RATIO: 83 %
Pre FEV6/FVC Ratio: 100 %
RV % pred: 99 %
RV: 1.87 L
TLC % pred: 98 %
TLC: 4.99 L

## 2013-11-03 NOTE — Progress Notes (Signed)
PFT done today. Jennifer Castillo, CMA  

## 2013-11-18 ENCOUNTER — Telehealth: Payer: Self-pay | Admitting: Pulmonary Disease

## 2013-11-18 NOTE — Telephone Encounter (Signed)
Results faxed, pt advised. Carron Curie, CMA

## 2013-12-11 ENCOUNTER — Other Ambulatory Visit: Payer: Self-pay

## 2013-12-11 DIAGNOSIS — R51 Headache: Secondary | ICD-10-CM

## 2013-12-11 MED ORDER — TOPIRAMATE 50 MG PO TABS: 50.0000 mg | ORAL_TABLET | Freq: Two times a day (BID) | ORAL | Status: DC

## 2013-12-30 ENCOUNTER — Encounter (INDEPENDENT_AMBULATORY_CARE_PROVIDER_SITE_OTHER): Payer: Self-pay

## 2013-12-30 DIAGNOSIS — Z0289 Encounter for other administrative examinations: Secondary | ICD-10-CM

## 2014-01-01 ENCOUNTER — Telehealth: Payer: Self-pay | Admitting: *Deleted

## 2014-01-01 NOTE — Telephone Encounter (Signed)
Will send to [facility] and [facility] as FYI of this appt. Patient refused to schedule with Dr Shelle Iron d/t having seen Dr Maple Hudson in the past--wanted to re-establish with Dr Maple Hudson.

## 2014-01-01 NOTE — Telephone Encounter (Signed)
Pt returned call.  Pt requested to be set up w/ [facility] for Pulmonary Consult instead of [facility] eventhough it would be a sooner appt.  Set pt up w/ [facility] on 02/06/14 @ 2:45.  Pt aware to arrive 15 mins early & to bring all meds, whether prescribed or purchased OTC , to this appt.  Antionette Fairy

## 2014-01-01 NOTE — Telephone Encounter (Signed)
Pt needs Pulm Consult appt with [facility] available-- 01/23/14 (or sooner if available)  Please remind patient to arrive 15-88mins early to fill out paperwork Bring all meds with her to her appt  Please let me know if scheduled. Thanks!

## 2014-01-13 ENCOUNTER — Telehealth: Payer: Self-pay | Admitting: Neurology

## 2014-01-13 DIAGNOSIS — F410 Panic disorder [episodic paroxysmal anxiety] without agoraphobia: Secondary | ICD-10-CM

## 2014-01-13 DIAGNOSIS — R569 Unspecified convulsions: Secondary | ICD-10-CM

## 2014-01-13 NOTE — Telephone Encounter (Signed)
Informed patient thru VM message that the order she is requesting will be faxed to [facility] per Joellen Jersey.

## 2014-01-13 NOTE — Telephone Encounter (Signed)
DONE.  [phone] faxed.

## 2014-01-13 NOTE — Telephone Encounter (Signed)
Consulted Dr. Pearlean Brownie,  [facility] for valproic acid level.  To send to [facility] in [place].

## 2014-01-13 NOTE — Telephone Encounter (Signed)
Pt called.  She stated that she has an appointment at [facility] in [place] at noon.  She said that Dr. Pearlean Brownie wanted her to have some blood work done to check her depakote levels.  She asked if we could fax the order to their office so they can have all the lab work that Dr. Pearlean Brownie would like to have her to have.  [facility]'s phone number is [phone] and their fax number is [phone].  Thank you

## 2014-01-13 NOTE — Telephone Encounter (Signed)
Patient is requesting lab order to check depacote levels for her office visit today and wants the order [phone]) to [facility] in [place] before noon.

## 2014-01-26 ENCOUNTER — Ambulatory Visit (INDEPENDENT_AMBULATORY_CARE_PROVIDER_SITE_OTHER): Payer: BC Managed Care – PPO | Admitting: Radiology

## 2014-01-26 DIAGNOSIS — R4182 Altered mental status, unspecified: Secondary | ICD-10-CM

## 2014-01-27 NOTE — Telephone Encounter (Signed)
Closing encounter

## 2014-02-06 ENCOUNTER — Ambulatory Visit (INDEPENDENT_AMBULATORY_CARE_PROVIDER_SITE_OTHER): Payer: BC Managed Care – PPO | Admitting: Internal Medicine

## 2014-02-06 ENCOUNTER — Encounter: Payer: Self-pay | Admitting: Internal Medicine

## 2014-02-06 VITALS — BP 126/68 | HR 60 | Ht 65.0 in | Wt 170.0 lb

## 2014-02-06 DIAGNOSIS — J45909 Unspecified asthma, uncomplicated: Secondary | ICD-10-CM

## 2014-02-06 DIAGNOSIS — G4733 Obstructive sleep apnea (adult) (pediatric): Secondary | ICD-10-CM

## 2014-02-06 NOTE — Patient Instructions (Signed)
Order- DME [facility]   Refit CPAP mask of choice to reduce leaks                                                        Work with patient on adjustment of Ramp for comfort                                                        Download for pressure compliance  Consider trying a nasal saline gel to see if it improves breathing comfort through your nose especially at bedtime  Adjust your CPAP humidifier for comfort  Sample x 2 Anoro Ellipta inhaler    1 puff, one time daily      See if you begin to notice less shortness of breath with exertion  You can still use the rescue albuterol inhaler if needed

## 2014-02-06 NOTE — Progress Notes (Signed)
02/06/14- 52 yoF former 2ppd/ 60 pk yr smoker seen for OSA, dyspnea, complicated by hx CVA Has been folllowed for OSA by Dr Shelle Iron, complicated by CVA/ patent foramen ovale, anxiety. PRESENTS FOR: Has seen [facility] for sleep, but asks I follow that problem along with her breathing issues..  Pt c/o increased SOB with exertion, has been worse since her stroke on 03/26/2013.   CPAP ?10/ [facility]. fullface mask is noisy. Has humidifier. Dyspnea since left body CVA in June of 2014. Dyspnea on exertion walking room to room. Denies cough or wheeze. Ventolin inhaler does not help. No leg weakness. She paces herself. Needing total knee replacement for arthritis which also limits activity. CXR 05/29/13 IMPRESSION:  No acute abnormalities seen.  Original Report Authenticated By: Alcide Clever, M.D. PFT 11/03/2013-mild obstructive airways disease with significant response to bronchodilator, normal lung volumes, mild diffusion defect. FVC 3.02/87%, FEV1 2.60/96%, FEV1/FVC 0.86, FEF 25-75% 4.50/174%, TLC 98%, DLCO 69%.  Prior to Admission medications   Medication Sig Start Date End Date Taking? Authorizing Provider  ALPRAZolam Prudy Feeler) 0.5 MG tablet Take 0.5 mg by mouth 2 (two) times daily as needed.    Yes Historical Provider, MD  aspirin 81 MG chewable tablet Chew 1 tablet (81 mg total) by mouth daily. 03/29/13  Yes Jerald Kief, MD  citalopram (CELEXA) 20 MG tablet Take 2 tablets (40 mg total) by mouth daily. 06/03/13  Yes Ronal Fear, NP  clobetasol ointment (TEMOVATE) 0.05 % Apply 1 application topically 2 (two) times daily.  04/25/12  Yes Historical Provider, MD  CycloSPORINE (RESTASIS OP) Apply 4 mLs to eye 2 (two) times daily.   Yes Historical Provider, MD  divalproex (DEPAKOTE ER) 500 MG 24 hr tablet Take 1 tablet (500 mg total) by mouth daily. 08/29/13  Yes Micki Riley, MD  fluticasone (FLONASE) 50 MCG/ACT nasal spray Place 2 sprays into the nose as needed. 05/29/13  Yes Historical Provider, MD   folic acid (FOLVITE) 1 MG tablet Take 1 mg by mouth daily.   Yes Historical Provider, MD  InFLIXimab (REMICADE IV) Inject 3 mg/kg into the vein every 6 (six) weeks.    Yes Historical Provider, MD  loratadine (CLARITIN) 10 MG tablet Take 10 mg by mouth daily.   Yes Historical Provider, MD  Multiple Vitamin (MULTIVITAMIN) capsule Take 1 capsule by mouth daily.    Yes Historical Provider, MD  NON FORMULARY Place 1 spray into the nose 2 (two) times daily as needed. Lidocaine Spray.  Use as directed for headaches.   Yes Historical Provider, MD  ondansetron (ZOFRAN) 4 MG tablet Take 1 tablet (4 mg total) by mouth every 8 (eight) hours as needed for nausea. 03/29/13  Yes Jerald Kief, MD  pantoprazole (PROTONIX) 40 MG tablet Take 1 tablet (40 mg total) by mouth daily. 11/25/12 02/06/14 Yes Nira Retort, NP  pravastatin (PRAVACHOL) 20 MG tablet Take 20 mg by mouth daily.   Yes Historical Provider, MD  PRESCRIPTION MEDICATION Inject 10 mg into the muscle every Friday. Methotrexate 25 mg/ml.  10 mg = 0.4 ml   Yes Historical Provider, MD  Probiotic Product (PROBIOTIC DAILY PO) Take 1 capsule by mouth daily.   Yes Historical Provider, MD  topiramate (TOPAMAX) 50 MG tablet Take 1 tablet (50 mg total) by mouth 2 (two) times daily. Take one tablet by mouth twice a day, if tolerated 12/11/13  Yes Ronal Fear, NP  traMADol (ULTRAM) 50 MG tablet Take 50-100 mg by mouth  2 (two) times daily as needed. For pain   Yes Historical Provider, MD  triamcinolone cream (KENALOG) 0.1 % Apply 1 application topically 2 (two) times daily.   Yes Historical Provider, MD  VENTOLIN HFA 108 (90 BASE) MCG/ACT inhaler Inhale 2 puffs into the lungs every 6 (six) hours as needed.  04/25/12  Yes Historical Provider, MD   Past Medical History  Diagnosis Date  . IBS (irritable bowel syndrome)     mixed  . GERD (gastroesophageal reflux disease)   . Allergic rhinitis   . Asthma   . Eczema   . Sleep apnea     cpap setting of 16, humidity  setting 30f 4% sleep study 07-10-2010 epic  . Rheumatoid arthritis(714.0) 2010    oa and ra  . Nephrolithiasis     current kidney stones  . Plaque psoriasis   . Cerebral vascular malformation     sees dr Lovell Sheehan for monitoring as needed, sees dr lewitt for headaches every 4 months  . Lactose intolerance   . PONV (postoperative nausea and vomiting)     also itching  . Anxiety   . Depression   . Stroke 03/26/2013   Past Surgical History  Procedure Laterality Date  . Incontinence surgery  2010    sling done   . Knee arthroscopy  1992    left  . Foot surgery  1999    right ankle  . Kidney stone surgery  2001  . Appendectomy  1981  . Cholecystectomy  1981  . Tubal ligation  1990  . Total abdominal hysterectomy  2001  . Hernia repair  2006  . Left foot plating and scarping for arthritis  2011  . Right ear tube insertion  2011  . Incisional hernia repair  05/20/2012    Procedure: HERNIA REPAIR INCISIONAL;  Surgeon: Clovis Pu. Cornett, MD;  Location: [facility] ORS;  Service: General;  Laterality: N/A;  . Tee without cardioversion N/A 05/15/2013    Procedure: TRANSESOPHAGEAL ECHOCARDIOGRAM (TEE);  Surgeon: Pamella Pert, MD;  Location: [facility] ENDOSCOPY;  Service: Cardiovascular;  Laterality: N/A;   Family History  Problem Relation Age of Onset  . Diabetes Maternal Grandmother   . Arthritis Maternal Grandmother   . Diabetes Brother   . Diabetes Paternal Grandmother   . Diabetes Maternal Grandfather   . Diabetes Paternal Grandfather   . Heart disease Brother   . Heart disease Other     Uncle  . Breast cancer Maternal Aunt   . Ovarian cancer Mother    History   Social History  . Marital Status: Married    Spouse Name: Onalee Hua    Number of Children: 3  . Years of Education: College   Occupational History  . Production assistant, radio   .  [employer]   Social History Main Topics  . Smoking status: Former Smoker -- 2.00 packs/day for 30 years    Types: Cigarettes     Quit date: 10/16/2000  . Smokeless tobacco: Never Used  . Alcohol Use: Yes     Comment: rare  . Drug Use: No  . Sexual Activity: Not on file   Other Topics Concern  . Not on file   Social History Narrative   Patient lives at home with family.   Caffeine Use: 1.5 cup of coffee in a.m   ROS-see HPI Constitutional:   No-   weight loss, night sweats, fevers, chills, fatigue, lassitude. HEENT:   No-  headaches,  difficulty swallowing, tooth/dental problems, sore throat,       No-  sneezing, itching, ear ache, nasal congestion, post nasal drip,  CV:  No-   chest pain, orthopnea, PND, swelling in lower extremities, anasarca,                                  dizziness, palpitations Resp: +shortness of breath with exertion or at rest.              No-   productive cough,  + non-productive cough,  No- coughing up of blood.              No-   change in color of mucus.  No- wheezing.   Skin: No-   rash or lesions. GI:  No-   heartburn, indigestion, abdominal pain, nausea, vomiting, diarrhea,                 change in bowel habits, loss of appetite GU: No-   dysuria, change in color of urine, no urgency or frequency.  No- flank pain. MS:  +joint pain or swelling.  . Neuro-     nothing unusual Psych:  No- change in mood or affect. No depression or anxiety.  No memory loss.  OBJ- Physical Exam General- Alert, Oriented, Affect-appropriate, Distress- none acute Skin- rash-none, lesions- none, excoriation- none Lymphadenopathy- none Head- atraumatic            Eyes- Gross vision intact, PERRLA, conjunctivae and secretions clear            Ears- Hearing, canals-normal            Nose- Clear, no-Septal dev, mucus, polyps, erosion, perforation             Throat- Mallampati II , mucosa clear , drainage- none, tonsils- atrophic Neck- flexible , trachea midline, no stridor , thyroid nl, carotid no bruit Chest - symmetrical excursion , unlabored           Heart/CV- RRR , no murmur , no gallop  ,  no rub, nl s1 s2                           - JVD- none , edema- none, stasis changes- none, varices- none           Lung- clear to P&A, wheeze- none, cough- none , dullness-none, rub- none           Chest wall-  Abd- tender-no, distended-no, bowel sounds-present, HSM- no Br/ Gen/ Rectal- Not done, not indicated Extrem- cyanosis- none, clubbing, none, atrophy- none, strength- nl Neuro- grossly intact to observation

## 2014-02-13 ENCOUNTER — Telehealth: Payer: Self-pay

## 2014-02-13 NOTE — Telephone Encounter (Signed)
[facility] sent a letter stating that they have tried contacting the patient several times unsuccessfully re: a mask refitting.  Her leak levels are high and a mask refit is imperative.  They have sent a letter to her house in a final attempt to contact her.  Just an FYI.

## 2014-02-13 NOTE — Telephone Encounter (Signed)
Noted  

## 2014-03-06 NOTE — Assessment & Plan Note (Addendum)
Lab-refit mask and verify pressure from [facility] D[facility].compliance seems good. Ok to adjust Ramp

## 2014-03-06 NOTE — Assessment & Plan Note (Addendum)
Dyspnea with exertion reflects asthma, dysmotility from arthritis and previous stroke, deconditioning. Uncertain cardiac component.

## 2014-03-20 ENCOUNTER — Ambulatory Visit (INDEPENDENT_AMBULATORY_CARE_PROVIDER_SITE_OTHER): Payer: BC Managed Care – PPO | Admitting: Internal Medicine

## 2014-03-20 ENCOUNTER — Encounter: Payer: Self-pay | Admitting: Internal Medicine

## 2014-03-20 ENCOUNTER — Ambulatory Visit (INDEPENDENT_AMBULATORY_CARE_PROVIDER_SITE_OTHER)
Admission: RE | Admit: 2014-03-20 | Discharge: 2014-03-20 | Disposition: A | Discharge status: Home / Self Care | Payer: BC Managed Care – PPO | Source: Ambulatory Visit | Attending: Internal Medicine | Admitting: Internal Medicine

## 2014-03-20 VITALS — BP 114/78 | HR 82 | Ht 65.0 in | Wt 176.4 lb

## 2014-03-20 DIAGNOSIS — G4733 Obstructive sleep apnea (adult) (pediatric): Secondary | ICD-10-CM

## 2014-03-20 DIAGNOSIS — J45909 Unspecified asthma, uncomplicated: Secondary | ICD-10-CM

## 2014-03-20 DIAGNOSIS — J449 Chronic obstructive pulmonary disease, unspecified: Secondary | ICD-10-CM

## 2014-03-20 DIAGNOSIS — IMO0001 Reserved for inherently not codable concepts without codable children: Secondary | ICD-10-CM

## 2014-03-20 DIAGNOSIS — J4520 Mild intermittent asthma, uncomplicated: Secondary | ICD-10-CM

## 2014-03-20 MED ORDER — BUDESONIDE-FORMOTEROL FUMARATE 160-4.5 MCG/ACT IN AERO: INHALATION_SPRAY | RESPIRATORY_TRACT | Status: DC

## 2014-03-20 MED ORDER — ALBUTEROL SULFATE HFA 108 (90 BASE) MCG/ACT IN AERS: 2.0000 | INHALATION_SPRAY | Freq: Four times a day (QID) | RESPIRATORY_TRACT | Status: DC | PRN

## 2014-03-20 MED ORDER — BUDESONIDE-FORMOTEROL FUMARATE 160-4.5 MCG/ACT IN AERO: 2.0000 | INHALATION_SPRAY | Freq: Two times a day (BID) | RESPIRATORY_TRACT | Status: DC

## 2014-03-20 NOTE — Assessment & Plan Note (Signed)
Clear today. Discussed different inhalers. She may not have enough bronchospasm to benefit. Plan- try sample Symbicort, refill ventolin

## 2014-03-20 NOTE — Progress Notes (Signed)
02/06/14- 52 yoF former 2ppd/ 60 pk yr smoker seen for OSA, dyspnea, complicated by hx CVA Has been folllowed for OSA by Dr Shelle Iron, complicated by CVA/ patent foramen ovale, anxiety. PRESENTS FOR: Has seen [facility] for [facility], but asks I follow that problem along with her breathing issues..  Pt c/o increased SOB with exertion, has been worse since her stroke on 03/26/2013.   CPAP ?10/ [facility]. fullface mask is noisy. Has humidifier. Dyspnea since left body CVA in June of 2014. Dyspnea on exertion walking room to room. Denies cough or wheeze. Ventolin inhaler does not help. No leg weakness. She paces herself. Needing total knee replacement for arthritis which also limits activity. CXR 05/29/13 IMPRESSION:  No acute abnormalities seen.  Original Report Authenticated By: Alcide Clever, M.D. PFT 11/03/2013-mild obstructive airways disease with significant response to bronchodilator, normal lung volumes, mild diffusion defect. FVC 3.02/87%, FEV1 2.60/96%, FEV1/FVC 0.86, FEF 25-75% 4.50/174%, TLC 98%, DLCO 69%.  03/30/14- 53 yoF former 2ppd/ 60 pk yr smoker seen for OSA, dyspnea, complicated by hx CVA Has been folllowed for OSA by Dr Shelle Iron, complicated by CVA/ patent foramen ovale, anxiety. Husband here FOLLOWS FOR: Pt states the Anoro is not helping, c/o an increase in cough. Pt states she is wearing CPAP nightly (hasnt used CPAP in one week d/t dental work), wearing CPAP about 10 hours per night. Pt denies issues with mask, pressure and machine.  Anoro made her cough more- dry or clear mucus  ROS-see HPI Constitutional:   No-   weight loss, night sweats, fevers, chills, fatigue, lassitude. HEENT:   + headaches, No-difficulty swallowing, tooth/dental problems, sore throat,       No-  sneezing, itching, ear ache, nasal congestion, post nasal drip,  CV:  No-   chest pain, orthopnea, PND, swelling in lower extremities, anasarca,                                  dizziness, palpitations Resp:  +shortness of breath with exertion or at rest.              No-   productive cough,  + non-productive cough,  No- coughing up of blood.              No-   change in color of mucus.  No- wheezing.   Skin: No-   rash or lesions. GI:  No-   heartburn, indigestion, abdominal pain, nausea, vomiting, GU: . MS:  +joint pain or swelling.  . Neuro-     nothing unusual Psych:  No- change in mood or affect. No depression or anxiety.  No memory loss.  OBJ- Physical Exam General- Alert, Oriented, Affect-appropriate, Distress- none acute Skin- rash-none, lesions- none, excoriation- none Lymphadenopathy- none Head- atraumatic            Eyes- Gross vision intact, PERRLA, conjunctivae and secretions clear            Ears- +R TM red w myringotomy tube (Dr Suszanne Conners)            Nose- Clear, no-Septal dev, mucus, polyps, erosion, perforation             Throat- Mallampati II , mucosa clear , drainage- none, tonsils- atrophic, +hoarse Neck- flexible , trachea midline, no stridor , thyroid nl, carotid no bruit Chest - symmetrical excursion , unlabored           Heart/CV- RRR ,  no murmur , no gallop  , no rub, nl s1 s2                           - JVD- none , edema- none, stasis changes- none, varices- none           Lung- clear to P&A, wheeze- none, cough- none , dullness-none, rub- none           Chest wall-  Abd-  Br/ Gen/ Rectal- Not done, not indicated Extrem- cyanosis- none, clubbing, none, atrophy- none, strength- nl Neuro- grossly intact to observation

## 2014-03-20 NOTE — Patient Instructions (Signed)
Sample/ script Symbicort 160   2 puffs then rinse mouth, twice daily    Maintenance inhaler  Script printed for Ventolin rescue inhaler   Order- CXR   Dx chronic obstructive asthma

## 2014-03-20 NOTE — Progress Notes (Signed)
Quick Note:  Spoke with pt, she is aware of results and recs. Nothing further needed. ______ 

## 2014-03-20 NOTE — Assessment & Plan Note (Signed)
Encouraged to resume CPAP as soon as dental extractions heal and she can wear mask again.

## 2014-03-24 ENCOUNTER — Other Ambulatory Visit: Payer: Self-pay | Admitting: Otolaryngology

## 2014-03-25 NOTE — Progress Notes (Signed)
Dr fitzgerald-pt needs to be done main due to airway issue-cpap-pt-

## 2014-03-27 ENCOUNTER — Encounter (HOSPITAL_COMMUNITY): Payer: Self-pay | Admitting: Pharmacy Technician

## 2014-03-31 ENCOUNTER — Encounter (HOSPITAL_COMMUNITY)
Admission: RE | Admit: 2014-03-31 | Discharge: 2014-03-31 | Disposition: A | Discharge status: Home / Self Care | Payer: BC Managed Care – PPO | Source: Ambulatory Visit | Attending: Otolaryngology | Admitting: Otolaryngology

## 2014-03-31 ENCOUNTER — Encounter (HOSPITAL_COMMUNITY): Payer: Self-pay

## 2014-03-31 ENCOUNTER — Encounter (HOSPITAL_BASED_OUTPATIENT_CLINIC_OR_DEPARTMENT_OTHER): Payer: Self-pay

## 2014-03-31 ENCOUNTER — Ambulatory Visit (HOSPITAL_BASED_OUTPATIENT_CLINIC_OR_DEPARTMENT_OTHER): Admit: 2014-03-31 | Payer: BC Managed Care – PPO | Admitting: Otolaryngology

## 2014-03-31 HISTORY — DX: Diverticulitis of intestine, part unspecified, without perforation or abscess without bleeding: K57.92

## 2014-03-31 HISTORY — DX: Insomnia, unspecified: G47.00

## 2014-03-31 HISTORY — DX: Personal history of urinary calculi: Z87.442

## 2014-03-31 HISTORY — DX: Fibromyalgia: M79.7

## 2014-03-31 HISTORY — DX: Nausea: R11.0

## 2014-03-31 HISTORY — DX: Effusion, unspecified joint: M25.40

## 2014-03-31 HISTORY — DX: Pain in unspecified joint: M25.50

## 2014-03-31 HISTORY — DX: Nontoxic single thyroid nodule: E04.1

## 2014-03-31 HISTORY — DX: Personal history of other infectious and parasitic diseases: Z86.19

## 2014-03-31 HISTORY — DX: Pneumonia, unspecified organism: J18.9

## 2014-03-31 HISTORY — DX: Personal history of other diseases of the nervous system and sense organs: Z86.69

## 2014-03-31 HISTORY — DX: Ventricular septal defect: Q21.0

## 2014-03-31 HISTORY — DX: Personal history of other diseases of the respiratory system: Z87.09

## 2014-03-31 HISTORY — DX: Unspecified convulsions: R56.9

## 2014-03-31 HISTORY — DX: Shortness of breath: R06.02

## 2014-03-31 HISTORY — DX: Hyperlipidemia, unspecified: E78.5

## 2014-03-31 LAB — CBC
HCT: 38.4 % (ref 36.0–46.0)
Hemoglobin: 13.2 g/dL (ref 12.0–15.0)
MCH: 34.5 pg — ABNORMAL HIGH (ref 26.0–34.0)
MCHC: 34.4 g/dL (ref 30.0–36.0)
MCV: 100.3 fL — ABNORMAL HIGH (ref 78.0–100.0)
PLATELETS: 185 10*3/uL (ref 150–400)
RBC: 3.83 MIL/uL — ABNORMAL LOW (ref 3.87–5.11)
RDW: 13.1 % (ref 11.5–15.5)
WBC: 6.9 10*3/uL (ref 4.0–10.5)

## 2014-03-31 LAB — BASIC METABOLIC PANEL
BUN: 11 mg/dL (ref 6–23)
CHLORIDE: 107 meq/L (ref 96–112)
CO2: 22 mEq/L (ref 19–32)
Calcium: 9.3 mg/dL (ref 8.4–10.5)
Creatinine, Ser: 0.56 mg/dL (ref 0.50–1.10)
Glucose, Bld: 90 mg/dL (ref 70–99)
POTASSIUM: 4.3 meq/L (ref 3.7–5.3)
Sodium: 143 mEq/L (ref 137–147)

## 2014-03-31 SURGERY — TONSILLECTOMY AND ADENOIDECTOMY
Anesthesia: General | Laterality: Bilateral

## 2014-03-31 NOTE — Pre-Procedure Instructions (Signed)
Oriya A Mitchner  03/31/2014   Your procedure is scheduled on:  Wed, June 17 @ 8:30 AM  Report to [facility] En[facility]ance [facility]  at 6:30  AM.  Call this number if you have problems the morning of surgery: [phone]   Remember:   Do not eat food or drink liquids after midnight.   Take these medicines the morning of surgery with A SIP OF WATER: Albuterol<Bring Your Inhaler With You>,Xanax(Alprazolam),Symbicort(Budesonide-Formoterol),Ciprodex,Citalopram(Celexa),Eye Drops,Depakote(Divalproex),Fluticasone(Flonase),Pain Pill(if needed),Claritin(Loratadine),Zofran(Ondansetron-if needed),Protonix(Pantoprazole),and Ultram(Tramadol)               No Goody's,BC's,Aleve,Aspirin,Ibuprofen,Fish Oil,or any Herbal Medications   Do not wear jewelry, make-up or nail polish.  Do not wear lotions, powders, or perfumes. You may wear deodorant.  Do not shave 48 hours prior to surgery.   Do not bring valuables to the hospital.  [facility] is not responsible                  for any belongings or valuables.               Contacts, dentures or bridgework may not be worn into surgery.  Leave suitcase in the car. After surgery it may be brought to your room.  For patients admitted to the hospital, discharge time is determined by your                treatment team.               Patients discharged the day of surgery will not be allowed to drive  home.    Special Instructions:  [facility] - Preparing for Surgery  Before surgery, you can play an important role.  Because skin is not sterile, your skin needs to be as free of germs as possible.  You can reduce the number of germs on you skin by washing with CHG (chlorahexidine gluconate) soap before surgery.  CHG is an antiseptic cleaner which kills germs and bonds with the skin to continue killing germs even after washing.  Please DO NOT use if you have an allergy to CHG or antibacterial soaps.  If your skin becomes reddened/irritated stop using the CHG and inform your  nurse when you arrive at Short Stay.  Do not shave (including legs and underarms) for at least 48 hours prior to the first CHG shower.  You may shave your face.  Please follow these instructions carefully:   1.  Shower with CHG Soap the night before surgery and the                                morning of Surgery.  2.  If you choose to wash your hair, wash your hair first as usual with your       normal shampoo.  3.  After you shampoo, rinse your hair and body thoroughly to remove the                      Shampoo.  4.  Use CHG as you would any other liquid soap.  You can apply chg directly       to the skin and wash gently with scrungie or a clean washcloth.  5.  Apply the CHG Soap to your body ONLY FROM THE NECK DOWN.        Do not use on open wounds or open sores.  Avoid contact with your eyes,  ears, mouth and genitals (private parts).  Wash genitals (private parts)       with your normal soap.  6.  Wash thoroughly, paying special attention to the area where your surgery        will be performed.  7.  Thoroughly rinse your body with warm water from the neck down.  8.  DO NOT shower/wash with your normal soap after using and rinsing off       the CHG Soap.  9.  Pat yourself dry with a clean towel.            10.  Wear clean pajamas.            11.  Place clean sheets on your bed the night of your first shower and do not        sleep with pets.  Day of Surgery  Do not apply any lotions/deoderants the morning of surgery.  Please wear clean clothes to the hospital/surgery center.     Please read over the following fact sheets that you were given: Pain Booklet, Coughing and Deep Breathing and Surgical Site Infection Prevention

## 2014-03-31 NOTE — Progress Notes (Addendum)
Dr.Ganji is cardiologist with last visit about 64months ago  Echo in epic from 2014  Heart cath in epic from 2004  EKG in epic from 05-29-13  Medical Md is Dr.Fred Andrey Campanile  Stress test within the last yr-to request from [facility]

## 2014-04-01 ENCOUNTER — Ambulatory Visit (HOSPITAL_COMMUNITY): Payer: BC Managed Care – PPO | Admitting: Anesthesiology

## 2014-04-01 ENCOUNTER — Ambulatory Visit (HOSPITAL_COMMUNITY)
Admission: RE | Admit: 2014-04-01 | Discharge: 2014-04-02 | Disposition: A | Discharge status: Home / Self Care | Payer: BC Managed Care – PPO | Source: Ambulatory Visit | Attending: Otolaryngology | Admitting: Otolaryngology

## 2014-04-01 ENCOUNTER — Encounter (HOSPITAL_COMMUNITY): Payer: BC Managed Care – PPO | Admitting: Anesthesiology

## 2014-04-01 ENCOUNTER — Encounter (HOSPITAL_COMMUNITY)
Admission: RE | Disposition: A | Discharge status: Home / Self Care | Payer: Self-pay | Source: Ambulatory Visit | Attending: Otolaryngology

## 2014-04-01 ENCOUNTER — Encounter (HOSPITAL_COMMUNITY): Payer: Self-pay | Admitting: Anesthesiology

## 2014-04-01 DIAGNOSIS — F329 Major depressive disorder, single episode, unspecified: Secondary | ICD-10-CM | POA: Insufficient documentation

## 2014-04-01 DIAGNOSIS — Z87891 Personal history of nicotine dependence: Secondary | ICD-10-CM | POA: Insufficient documentation

## 2014-04-01 DIAGNOSIS — IMO0001 Reserved for inherently not codable concepts without codable children: Secondary | ICD-10-CM | POA: Insufficient documentation

## 2014-04-01 DIAGNOSIS — Z9089 Acquired absence of other organs: Secondary | ICD-10-CM

## 2014-04-01 DIAGNOSIS — I6990 Unspecified sequelae of unspecified cerebrovascular disease: Secondary | ICD-10-CM | POA: Insufficient documentation

## 2014-04-01 DIAGNOSIS — J3501 Chronic tonsillitis: Secondary | ICD-10-CM | POA: Insufficient documentation

## 2014-04-01 DIAGNOSIS — J45909 Unspecified asthma, uncomplicated: Secondary | ICD-10-CM | POA: Insufficient documentation

## 2014-04-01 DIAGNOSIS — J312 Chronic pharyngitis: Secondary | ICD-10-CM | POA: Insufficient documentation

## 2014-04-01 DIAGNOSIS — Z01812 Encounter for preprocedural laboratory examination: Secondary | ICD-10-CM | POA: Insufficient documentation

## 2014-04-01 DIAGNOSIS — Q2111 Secundum atrial septal defect: Secondary | ICD-10-CM | POA: Insufficient documentation

## 2014-04-01 DIAGNOSIS — F411 Generalized anxiety disorder: Secondary | ICD-10-CM | POA: Insufficient documentation

## 2014-04-01 DIAGNOSIS — Q211 Atrial septal defect: Secondary | ICD-10-CM | POA: Insufficient documentation

## 2014-04-01 DIAGNOSIS — G4730 Sleep apnea, unspecified: Secondary | ICD-10-CM | POA: Insufficient documentation

## 2014-04-01 DIAGNOSIS — G40909 Epilepsy, unspecified, not intractable, without status epilepticus: Secondary | ICD-10-CM | POA: Insufficient documentation

## 2014-04-01 DIAGNOSIS — F3289 Other specified depressive episodes: Secondary | ICD-10-CM | POA: Insufficient documentation

## 2014-04-01 DIAGNOSIS — K219 Gastro-esophageal reflux disease without esophagitis: Secondary | ICD-10-CM | POA: Insufficient documentation

## 2014-04-01 HISTORY — PX: TONSILLECTOMY AND ADENOIDECTOMY: SHX28

## 2014-04-01 HISTORY — PX: TONSILLECTOMY AND ADENOIDECTOMY: SUR1326

## 2014-04-01 SURGERY — TONSILLECTOMY AND ADENOIDECTOMY
Anesthesia: General | Laterality: Bilateral

## 2014-04-01 MED ORDER — SUFENTANIL CITRATE 50 MCG/ML IV SOLN
INTRAVENOUS | Status: AC
  Filled 2014-04-01: qty 1

## 2014-04-01 MED ORDER — OXYCODONE HCL 5 MG/5ML PO SOLN: 5.0000 mg | ORAL | Status: DC | PRN

## 2014-04-01 MED ORDER — ROCURONIUM BROMIDE 100 MG/10ML IV SOLN
INTRAVENOUS | Status: DC | PRN
  Administered 2014-04-01: 50 mg via INTRAVENOUS

## 2014-04-01 MED ORDER — DIVALPROEX SODIUM ER 500 MG PO TB24
500.0000 mg | ORAL_TABLET | Freq: Two times a day (BID) | ORAL | Status: DC
  Administered 2014-04-01 – 2014-04-02 (×2): 500 mg via ORAL
  Filled 2014-04-01 (×3): qty 1

## 2014-04-01 MED ORDER — LIDOCAINE HCL (CARDIAC) 20 MG/ML IV SOLN
INTRAVENOUS | Status: AC
  Filled 2014-04-01: qty 5

## 2014-04-01 MED ORDER — HYDROMORPHONE HCL PF 1 MG/ML IJ SOLN
INTRAMUSCULAR | Status: AC
  Filled 2014-04-01: qty 1

## 2014-04-01 MED ORDER — HYDROMORPHONE HCL PF 1 MG/ML IJ SOLN
0.2500 mg | INTRAMUSCULAR | Status: DC | PRN
  Administered 2014-04-01 (×2): 0.5 mg via INTRAVENOUS

## 2014-04-01 MED ORDER — GLYCOPYRROLATE 0.2 MG/ML IJ SOLN
INTRAMUSCULAR | Status: AC
  Filled 2014-04-01: qty 3

## 2014-04-01 MED ORDER — SUCCINYLCHOLINE CHLORIDE 20 MG/ML IJ SOLN
INTRAMUSCULAR | Status: AC
  Filled 2014-04-01: qty 1

## 2014-04-01 MED ORDER — ROCURONIUM BROMIDE 50 MG/5ML IV SOLN
INTRAVENOUS | Status: AC
  Filled 2014-04-01: qty 1

## 2014-04-01 MED ORDER — OXYCODONE HCL 5 MG/5ML PO SOLN: 5.0000 mg | Freq: Once | ORAL | Status: DC | PRN

## 2014-04-01 MED ORDER — PROMETHAZINE HCL 25 MG RE SUPP: 25.0000 mg | Freq: Four times a day (QID) | RECTAL | Status: DC | PRN

## 2014-04-01 MED ORDER — ARTIFICIAL TEARS OP OINT
TOPICAL_OINTMENT | OPHTHALMIC | Status: DC | PRN
  Administered 2014-04-01: 1 via OPHTHALMIC

## 2014-04-01 MED ORDER — NEOSTIGMINE METHYLSULFATE 10 MG/10ML IV SOLN
INTRAVENOUS | Status: AC
  Filled 2014-04-01: qty 1

## 2014-04-01 MED ORDER — MIDAZOLAM HCL 2 MG/2ML IJ SOLN
INTRAMUSCULAR | Status: AC
  Filled 2014-04-01: qty 2

## 2014-04-01 MED ORDER — METHOTREXATE (PF) 10 MG/0.4ML ~~LOC~~ SOAJ: 10.0000 mg | SUBCUTANEOUS | Status: DC

## 2014-04-01 MED ORDER — PROPOFOL 10 MG/ML IV BOLUS
INTRAVENOUS | Status: DC | PRN
  Administered 2014-04-01: 100 mg via INTRAVENOUS

## 2014-04-01 MED ORDER — SIMVASTATIN 10 MG PO TABS
10.0000 mg | ORAL_TABLET | Freq: Every day | ORAL | Status: DC
  Administered 2014-04-01: 10 mg via ORAL
  Filled 2014-04-01 (×2): qty 1

## 2014-04-01 MED ORDER — BUDESONIDE-FORMOTEROL FUMARATE 160-4.5 MCG/ACT IN AERO
2.0000 | INHALATION_SPRAY | Freq: Two times a day (BID) | RESPIRATORY_TRACT | Status: DC
  Filled 2014-04-01: qty 6

## 2014-04-01 MED ORDER — KCL IN DEXTROSE-NACL 20-5-0.45 MEQ/L-%-% IV SOLN
INTRAVENOUS | Status: DC
  Administered 2014-04-01: 12:00:00 via INTRAVENOUS
  Administered 2014-04-01: 75 mL/h via INTRAVENOUS
  Filled 2014-04-01 (×3): qty 1000

## 2014-04-01 MED ORDER — CLOBETASOL PROPIONATE 0.05 % EX OINT
1.0000 "application " | TOPICAL_OINTMENT | Freq: Two times a day (BID) | CUTANEOUS | Status: DC | PRN
  Filled 2014-04-01: qty 15

## 2014-04-01 MED ORDER — DEXAMETHASONE SODIUM PHOSPHATE 10 MG/ML IJ SOLN
INTRAMUSCULAR | Status: DC | PRN
  Administered 2014-04-01: 10 mg via INTRAVENOUS

## 2014-04-01 MED ORDER — TOPIRAMATE 25 MG PO TABS
50.0000 mg | ORAL_TABLET | Freq: Two times a day (BID) | ORAL | Status: DC
  Administered 2014-04-01 – 2014-04-02 (×2): 50 mg via ORAL
  Filled 2014-04-01 (×3): qty 2

## 2014-04-01 MED ORDER — OXYCODONE HCL 5 MG PO TABS: 5.0000 mg | ORAL_TABLET | Freq: Once | ORAL | Status: DC | PRN

## 2014-04-01 MED ORDER — ONDANSETRON HCL 4 MG/2ML IJ SOLN: 4.0000 mg | Freq: Once | INTRAMUSCULAR | Status: DC | PRN

## 2014-04-01 MED ORDER — BUDESONIDE-FORMOTEROL FUMARATE 160-4.5 MCG/ACT IN AERO
2.0000 | INHALATION_SPRAY | Freq: Two times a day (BID) | RESPIRATORY_TRACT | Status: DC
  Administered 2014-04-02: 2 via RESPIRATORY_TRACT
  Filled 2014-04-01 (×2): qty 6

## 2014-04-01 MED ORDER — MEPERIDINE HCL 25 MG/ML IJ SOLN
6.2500 mg | INTRAMUSCULAR | Status: DC | PRN
Start: 2014-04-01 — End: 2014-04-01

## 2014-04-01 MED ORDER — OXYMETAZOLINE HCL 0.05 % NA SOLN
NASAL | Status: DC | PRN
  Administered 2014-04-01: 1 via NASAL

## 2014-04-01 MED ORDER — OXYCODONE HCL 5 MG/5ML PO SOLN
5.0000 mg | ORAL | Status: DC | PRN
  Administered 2014-04-02: 10 mg via ORAL
  Filled 2014-04-01: qty 10

## 2014-04-01 MED ORDER — ALBUTEROL SULFATE (2.5 MG/3ML) 0.083% IN NEBU: 3.0000 mL | INHALATION_SOLUTION | Freq: Four times a day (QID) | RESPIRATORY_TRACT | Status: DC | PRN

## 2014-04-01 MED ORDER — CITALOPRAM HYDROBROMIDE 20 MG PO TABS
20.0000 mg | ORAL_TABLET | Freq: Every day | ORAL | Status: DC
  Administered 2014-04-01 – 2014-04-02 (×2): 20 mg via ORAL
  Filled 2014-04-01 (×2): qty 1

## 2014-04-01 MED ORDER — FOLIC ACID 1 MG PO TABS
1.0000 mg | ORAL_TABLET | Freq: Every day | ORAL | Status: DC
  Administered 2014-04-01 – 2014-04-02 (×2): 1 mg via ORAL
  Filled 2014-04-01 (×2): qty 1

## 2014-04-01 MED ORDER — ALPRAZOLAM 0.5 MG PO TABS: 0.5000 mg | ORAL_TABLET | Freq: Two times a day (BID) | ORAL | Status: DC | PRN

## 2014-04-01 MED ORDER — SUFENTANIL CITRATE 50 MCG/ML IV SOLN
INTRAVENOUS | Status: DC | PRN
  Administered 2014-04-01: 30 ug via INTRAVENOUS

## 2014-04-01 MED ORDER — OXYMETAZOLINE HCL 0.05 % NA SOLN
NASAL | Status: AC
  Filled 2014-04-01: qty 15

## 2014-04-01 MED ORDER — ACETAMINOPHEN 160 MG/5ML PO SOLN: 325.0000 mg | ORAL | Status: DC | PRN

## 2014-04-01 MED ORDER — PROPOFOL 10 MG/ML IV BOLUS
INTRAVENOUS | Status: AC
  Filled 2014-04-01: qty 20

## 2014-04-01 MED ORDER — CYCLOSPORINE 0.05 % OP EMUL
4.0000 [drp] | Freq: Two times a day (BID) | OPHTHALMIC | Status: DC
  Administered 2014-04-01 – 2014-04-02 (×2): 4 [drp] via OPHTHALMIC
  Filled 2014-04-01 (×4): qty 1

## 2014-04-01 MED ORDER — PHENOL 1.4 % MT LIQD: 1.0000 | OROMUCOSAL | Status: DC | PRN

## 2014-04-01 MED ORDER — MORPHINE SULFATE 2 MG/ML IJ SOLN
2.0000 mg | INTRAMUSCULAR | Status: DC | PRN
  Administered 2014-04-01 (×4): 4 mg via INTRAVENOUS
  Administered 2014-04-02: 2 mg via INTRAVENOUS
  Filled 2014-04-01: qty 2
  Filled 2014-04-01: qty 1
  Filled 2014-04-01 (×3): qty 2

## 2014-04-01 MED ORDER — LACTATED RINGERS IV SOLN
INTRAVENOUS | Status: DC | PRN
  Administered 2014-04-01 (×2): via INTRAVENOUS

## 2014-04-01 MED ORDER — SODIUM CHLORIDE 0.9 % IR SOLN
Status: DC | PRN
  Administered 2014-04-01: 1000 mL

## 2014-04-01 MED ORDER — LIDOCAINE HCL (CARDIAC) 20 MG/ML IV SOLN
INTRAVENOUS | Status: DC | PRN
  Administered 2014-04-01: 100 mg via INTRAVENOUS

## 2014-04-01 MED ORDER — ONDANSETRON HCL 4 MG/2ML IJ SOLN
INTRAMUSCULAR | Status: AC
  Filled 2014-04-01: qty 2

## 2014-04-01 MED ORDER — PROMETHAZINE HCL 25 MG PO TABS: 25.0000 mg | ORAL_TABLET | Freq: Four times a day (QID) | ORAL | Status: DC | PRN

## 2014-04-01 MED ORDER — ONDANSETRON HCL 4 MG/2ML IJ SOLN
INTRAMUSCULAR | Status: DC | PRN
  Administered 2014-04-01: 4 mg via INTRAVENOUS

## 2014-04-01 MED ORDER — GLYCOPYRROLATE 0.2 MG/ML IJ SOLN
INTRAMUSCULAR | Status: DC | PRN
  Administered 2014-04-01: .8 mg via INTRAVENOUS

## 2014-04-01 MED ORDER — MIDAZOLAM HCL 5 MG/5ML IJ SOLN
INTRAMUSCULAR | Status: DC | PRN
  Administered 2014-04-01 (×2): 1 mg via INTRAVENOUS

## 2014-04-01 MED ORDER — NEOSTIGMINE METHYLSULFATE 10 MG/10ML IV SOLN
INTRAVENOUS | Status: DC | PRN
  Administered 2014-04-01: 5 mg via INTRAVENOUS

## 2014-04-01 MED ORDER — CLINDAMYCIN PHOSPHATE 600 MG/50ML IV SOLN
600.0000 mg | Freq: Three times a day (TID) | INTRAVENOUS | Status: AC
  Administered 2014-04-01 – 2014-04-02 (×3): 600 mg via INTRAVENOUS
  Filled 2014-04-01 (×3): qty 50

## 2014-04-01 MED ORDER — PANTOPRAZOLE SODIUM 40 MG PO TBEC
40.0000 mg | DELAYED_RELEASE_TABLET | Freq: Every day | ORAL | Status: DC
  Administered 2014-04-01 – 2014-04-02 (×2): 40 mg via ORAL
  Filled 2014-04-01 (×2): qty 1

## 2014-04-01 SURGICAL SUPPLY — 24 items
BLADE 10 SAFETY STRL DISP (BLADE) ×2 IMPLANT
CANISTER SUCTION 2500CC (MISCELLANEOUS) ×2 IMPLANT
CATH ROBINSON RED A/P 10FR (CATHETERS) ×1 IMPLANT
ELECT REM PT RETURN 9FT ADLT (ELECTROSURGICAL)
ELECT REM PT RETURN 9FT PED (ELECTROSURGICAL)
ELECTRODE REM PT RETRN 9FT PED (ELECTROSURGICAL) IMPLANT
ELECTRODE REM PT RTRN 9FT ADLT (ELECTROSURGICAL) IMPLANT
GAUZE SPONGE 4X4 16PLY XRAY LF (GAUZE/BANDAGES/DRESSINGS) ×2 IMPLANT
GLOVE ECLIPSE 7.5 STRL STRAW (GLOVE) ×2 IMPLANT
GOWN STRL REUS W/ TWL LRG LVL3 (GOWN DISPOSABLE) ×2 IMPLANT
GOWN STRL REUS W/TWL LRG LVL3 (GOWN DISPOSABLE) ×4
KIT BASIN OR (CUSTOM PROCEDURE TRAY) ×2 IMPLANT
KIT ROOM TURNOVER OR (KITS) ×2 IMPLANT
NS IRRIG 1000ML POUR BTL (IV SOLUTION) ×2 IMPLANT
PACK SURGICAL SETUP 50X90 (CUSTOM PROCEDURE TRAY) ×2 IMPLANT
PAD ARMBOARD 7.5X6 YLW CONV (MISCELLANEOUS) ×4 IMPLANT
SPECIMEN JAR SMALL (MISCELLANEOUS) ×2 IMPLANT
SPONGE TONSIL 1 RF SGL (DISPOSABLE) ×2 IMPLANT
SYR BULB 3OZ (MISCELLANEOUS) ×2 IMPLANT
TOWEL OR 17X24 6PK STRL BLUE (TOWEL DISPOSABLE) ×4 IMPLANT
TUBE CONNECTING 12X1/4 (SUCTIONS) ×2 IMPLANT
TUBE SALEM SUMP 16 FR W/ARV (TUBING) ×1 IMPLANT
WAND COBLATOR 70 EVAC XTRA (SURGICAL WAND) ×2 IMPLANT
WATER STERILE IRR 1000ML POUR (IV SOLUTION) ×2 IMPLANT

## 2014-04-01 NOTE — Transfer of Care (Signed)
Immediate Anesthesia Transfer of Care Note  Patient: [name]  Procedure(s) Performed: Procedure(s): BILATERAL TONSILLECTOMY AND ADENOIDECTOMY (Bilateral)  Patient Location: PACU  Anesthesia Type:General  Level of Consciousness: awake and alert   Airway & Oxygen Therapy: Patient Spontanous Breathing and Patient connected to nasal cannula oxygen  Post-op Assessment: Report given to PACU RN  Post vital signs: Reviewed and stable  Complications: No apparent anesthesia complications

## 2014-04-01 NOTE — Anesthesia Preprocedure Evaluation (Addendum)
Anesthesia Evaluation  Patient identified by MRN, date of birth, ID band Patient awake    Reviewed: Allergy & Precautions, H&P , NPO status , Patient's Chart, lab work & pertinent test results  History of Anesthesia Complications (+) PONV and history of anesthetic complications  Airway Mallampati: II TM Distance: >3 FB Neck ROM: Full    Dental  (+) Dental Advisory Given, Poor Dentition, Caps   Pulmonary shortness of breath, asthma , sleep apnea and Continuous Positive Airway Pressure Ventilation , former smoker,          Cardiovascular + DOE  PFO- followed by Dr. Jacinto Halim   Neuro/Psych  Headaches, Seizures -, Well Controlled,  PSYCHIATRIC DISORDERS Anxiety Depression CVA, Residual Symptoms    GI/Hepatic GERD-  ,  Endo/Other    Renal/GU      Musculoskeletal  (+) Arthritis -, Fibromyalgia -  Abdominal   Peds  Hematology   Anesthesia Other Findings   Reproductive/Obstetrics                         Anesthesia Physical Anesthesia Plan  ASA: III  Anesthesia Plan: General   Post-op Pain Management:    Induction: Intravenous  Airway Management Planned: Oral ETT  Additional Equipment:   Intra-op Plan:   Post-operative Plan: Extubation in OR  Informed Consent: I have reviewed the patients History and Physical, chart, labs and discussed the procedure including the risks, benefits and alternatives for the proposed anesthesia with the patient or authorized representative who has indicated his/her understanding and acceptance.     Plan Discussed with: CRNA and Surgeon  Anesthesia Plan Comments:         Anesthesia Quick Evaluation

## 2014-04-01 NOTE — H&P (Signed)
  H&P Update  Pt's original H&P dated 04/01/14 reviewed and placed in chart (to be scanned).  I personally examined the patient today.  No change in health. Proceed with adenotonsillectomy.

## 2014-04-01 NOTE — Anesthesia Postprocedure Evaluation (Signed)
Anesthesia Post Note  Patient: [name]  Procedure(s) Performed: Procedure(s) (LRB): BILATERAL TONSILLECTOMY AND ADENOIDECTOMY (Bilateral)  Anesthesia type: general  Patient location: PACU  Post pain: Pain level controlled  Post assessment: Patient's Cardiovascular Status Stable  Last Vitals:  Filed Vitals:   04/01/14 1022  BP: 112/61  Pulse: 67  Temp:   Resp: 16    Post vital signs: Reviewed and stable  Level of consciousness: sedated  Complications: No apparent anesthesia complications

## 2014-04-01 NOTE — Op Note (Signed)
DATE OF PROCEDURE:  04/01/2014                              OPERATIVE REPORT  SURGEON:  Newman Pies, MD  PREOPERATIVE DIAGNOSES: 1. Adenotonsillar hypertrophy. 2. Chronic tonsillitis and pharyngitis  POSTOPERATIVE DIAGNOSES: 1. Adenotonsillar hypertrophy. 2. Chronic tonsillitis and pharyngitis  PROCEDURE PERFORMED:  Adenotonsillectomy.  ANESTHESIA:  General endotracheal tube anesthesia.  COMPLICATIONS:  None.  ESTIMATED BLOOD LOSS:  Minimal.  INDICATION FOR PROCEDURE:  [name] is a 53 y.o. female with a history of chronic tonsillitis/pharyngitis and halitosis.  According to the patient, she has been experiencing chronic throat discomfort with halitosis for several years. The patient continues to be symptomatic despite medical treatments. On examination, the patient was noted to have bilateral cryptic tonsils, with numerous tonsilloliths. Based on the above findings, the decision was made for the patient to undergo the adenotonsillectomy procedure. Likelihood of success in reducing symptoms was also discussed.  The risks, benefits, alternatives, and details of the procedure were discussed with the mother.  Questions were invited and answered.  Informed consent was obtained.  DESCRIPTION:  The patient was taken to the operating room and placed supine on the operating table.  General endotracheal tube anesthesia was administered by the anesthesiologist.  The patient was positioned and prepped and draped in a standard fashion for adenotonsillectomy.  A Crowe-Davis mouth gag was inserted into the oral cavity for exposure. 3+ cryptic tonsils were noted bilaterally.  No bifidity was noted.  Indirect mirror examination of the nasopharynx revealed mild adenoid hypertrophy. The adenoid was ablated with the Coblator device. Hemostasis was achieved with the Coblator device.  The right tonsil was then grasped with a straight Allis clamp and retracted medially.  It was resected free from the underlying  pharyngeal constrictor muscles with the Coblator device.  The same procedure was repeated on the left side without exception.  The surgical sites were copiously irrigated.  The mouth gag was removed.  The care of the patient was turned over to the anesthesiologist.  The patient was awakened from anesthesia without difficulty.  The patient was extubated and transferred to the recovery room in good condition.  OPERATIVE FINDINGS:  Adenotonsillar hypertrophy.  SPECIMEN:  Bilateral tonsils  FOLLOWUP CARE:  The patient will be observed overnight due to her sleep apnea.  She will be placed on oxycodone 5-42ml po q 4 hours for postop pain control.   The patient will follow up in my office in approximately 2 weeks.  TEOH,SUI W 04/01/2014 9:18 AM

## 2014-04-01 NOTE — Discharge Instructions (Signed)
[name] [facility] M[facility] °Postoperative Instructions for Tonsillectomy & Adenoidectomy (T&A) °Activity °Restrict activity at home for the first two days, resting as much as possible. Light indoor activity is best. You may usually return to school or work within a week but void strenuous activity and sports for two weeks. Sleep with your head elevated on 2-3 pillows for 3-4 days to help decrease swelling. °Diet °Due to tissue swelling and throat discomfort, you may have little desire to drink for several days. However fluids are very important to prevent dehydration. You will find that non-acidic juices, soups, popsicles, Jell-O, custard, puddings, and any soft or mashed foods taken in small quantities can be swallowed fairly easily. Try to increase your fluid and food intake as the discomfort subsides. It is recommended that a child receive 1-1/2 quarts of fluid in a 24-hour period. Adult require twice this amount.  °Discomfort °Your sore throat may be relieved by applying an ice collar to your neck and/or by taking Tylenol®. You may experience an earache, which is due to referred pain from the throat. Referred ear pain is commonly felt at night when trying to rest. ° °Bleeding                        Although rare, there is risk of having some bleeding during the first 2 weeks after having a T&A. This usually happens between days 7-10 postoperatively. If you or your child should have any bleeding, try to remain calm. We recommend sitting up quietly in a chair and gently spitting out the blood into a bowl. For adults, gargling gently with ice water may help. If the bleeding does not stop after a short time (5 minutes), is more than 1 teaspoonful, or if you become worried, please call our office at [phone] or go directly to the nearest hospital emergency room. Do not eat or drink anything prior to going to the hospital as you may need to be taken to the operating room in order to control the bleeding. °GENERAL  CONSIDERATIONS °1. Brush your teeth regularly. Avoid mouthwashes and gargles for three weeks. You may gargle gently with warm salt-water as necessary or spray with Chloraseptic®. You may make salt-water by placing 2 teaspoons of table salt into a quart of fresh water. Warm the salt-water in a microwave to a luke warm temperature.  °2. Avoid exposure to colds and upper respiratory infections if possible.  °3. If you look into a mirror or into your child's mouth, you will see white-gray patches in the back of the throat. This is normal after having a T&A and is like a scab that forms on the skin after an abrasion. It will disappear once the back of the throat heals completely. However, it may cause a noticeable odor; this too will disappear with time. Again, warm salt-water gargles may be used to help keep the throat clean and promote healing.  °4. You may notice a temporary change in voice quality, such as a higher pitched voice or a nasal sound, until healing is complete. This may last for 1-2 weeks and should resolve.  °5. Do not take or give you child any medications that we have not prescribed or recommended.  °6. Snoring may occur, especially at night, for the first week after a T&A. It is due to swelling of the soft palate and will usually resolve.  °Please call our office at [phone] if you have any questions.   °

## 2014-04-02 MED ORDER — HYDROCODONE-ACETAMINOPHEN 7.5-325 MG/15ML PO SOLN
15.0000 mL | ORAL | Status: DC | PRN
  Administered 2014-04-02: 15 mL via ORAL
  Filled 2014-04-02: qty 15

## 2014-04-02 MED ORDER — HYDROCODONE-ACETAMINOPHEN 7.5-325 MG/15ML PO SOLN: 15.0000 mL | ORAL | Status: DC | PRN

## 2014-04-02 MED ORDER — DIPHENHYDRAMINE HCL 50 MG/ML IJ SOLN
25.0000 mg | Freq: Four times a day (QID) | INTRAMUSCULAR | Status: DC | PRN
  Administered 2014-04-02: 25 mg via INTRAVENOUS
  Filled 2014-04-02: qty 1

## 2014-04-02 NOTE — Discharge Summary (Signed)
Physician Discharge Summary  Patient ID: [name] MRN: [id number] DOB/AGE: [birth year] 53 y.o.  Admit date: 04/01/2014 Discharge date: 04/02/2014  Admission Diagnoses: Chronic tonsillitis  Discharge Diagnoses: Chronic tonsillitis Active Problems:   S/P tonsillectomy and adenoidectomy   Discharged Condition: good  Hospital Course: Pt had an uneventful overnight stay. Pt tolerated po well. No bleeding. No stridor.  Consults: None  Significant Diagnostic Studies: none  Treatments: surgery: T&A  Discharge Exam: Blood pressure 118/57, pulse 64, temperature 98.7 F (37.1 C), temperature source Oral, resp. rate 20, height 5\' 5"  (1.651 m), weight 173 lb (78.472 kg), SpO2 100.00%. No bleeding No stridor  Disposition: 01-Home or Self Care  Discharge Instructions   Activity as tolerated - No restrictions    Complete by:  As directed      Diet general    Complete by:  As directed             Medication List    STOP taking these medications       aspirin 81 MG chewable tablet     HYDROcodone-acetaminophen 5-325 MG per tablet  Commonly known as:  NORCO/VICODIN  Replaced by:  HYDROcodone-acetaminophen 7.5-325 mg/15 ml solution      TAKE these medications       albuterol 108 (90 BASE) MCG/ACT inhaler  Commonly known as:  PROVENTIL HFA;VENTOLIN HFA  Inhale 2 puffs into the lungs every 6 (six) hours as needed for wheezing or shortness of breath.     ALPRAZolam 0.5 MG tablet  Commonly known as:  XANAX  Take 0.5 mg by mouth 2 (two) times daily as needed for anxiety.     budesonide-formoterol 160-4.5 MCG/ACT inhaler  Commonly known as:  SYMBICORT  Inhale 2 puffs into the lungs 2 (two) times daily.     ciprofloxacin-dexamethasone otic suspension  Commonly known as:  CIPRODEX  Place 4 drops into the right ear 2 (two) times daily.     citalopram 20 MG tablet  Commonly known as:  CELEXA  Take 20 mg by mouth daily.     clobetasol ointment 0.05 %  Commonly known  as:  TEMOVATE  Apply 1 application topically 2 (two) times daily as needed (inflammation on feet.).     cycloSPORINE 0.05 % ophthalmic emulsion  Commonly known as:  RESTASIS  Place 4 drops into both eyes 2 (two) times daily.     divalproex 500 MG 24 hr tablet  Commonly known as:  DEPAKOTE ER  Take 500 mg by mouth 2 (two) times daily.     fluticasone 50 MCG/ACT nasal spray  Commonly known as:  FLONASE  Place 2 sprays into both nostrils daily as needed for allergies.     folic acid 1 MG tablet  Commonly known as:  FOLVITE  Take 1 mg by mouth daily.     HYDROcodone-acetaminophen 7.5-325 mg/15 ml solution  Commonly known as:  HYCET  Take 15 mLs by mouth every 4 (four) hours as needed for moderate pain or severe pain.     loratadine 10 MG tablet  Commonly known as:  CLARITIN  Take 10 mg by mouth daily.     Methotrexate (PF) 10 MG/0.4ML Soaj  Inject 10 mg into the skin once a week. Friday     multivitamin capsule  Take 1 capsule by mouth daily.     NON FORMULARY  Place 1 spray into the nose 2 (two) times daily as needed. Compounded Lidocaine Spray.  Use as directed for headaches. [facility])  ondansetron 4 MG tablet  Commonly known as:  ZOFRAN  Take 1 tablet (4 mg total) by mouth every 8 (eight) hours as needed for nausea.     oxyCODONE 5 MG/5ML solution  Commonly known as:  ROXICODONE  Take 5-10 mLs (5-10 mg total) by mouth every 4 (four) hours as needed for severe pain.     pantoprazole 40 MG tablet  Commonly known as:  PROTONIX  Take 40 mg by mouth daily.     pravastatin 20 MG tablet  Commonly known as:  PRAVACHOL  Take 20 mg by mouth daily.     PROBIOTIC DAILY PO  Take 1 capsule by mouth daily.     REMICADE IV  Inject 3 mg/kg into the vein every 6 (six) weeks.     topiramate 50 MG tablet  Commonly known as:  TOPAMAX  Take 50 mg by mouth 2 (two) times daily.     traMADol 50 MG tablet  Commonly known as:  ULTRAM  Take 100 mg by mouth 2 (two)  times daily as needed for moderate pain.           Follow-up Information   Follow up with Darletta Moll, MD In 2 weeks. (as scheduled)    Specialty:  Otolaryngology   Contact information:   [address] [place] [address] [phone]       Signed: Darletta Moll 04/02/2014, 7:51 AM

## 2014-04-02 NOTE — Progress Notes (Signed)
Discussed discharge summary with pt. Reviewed all medications with pt. Pt did not have any questions. Pt ready for discharge.

## 2014-04-05 ENCOUNTER — Encounter (HOSPITAL_COMMUNITY): Payer: Self-pay | Admitting: Otolaryngology

## 2014-05-04 ENCOUNTER — Ambulatory Visit (INDEPENDENT_AMBULATORY_CARE_PROVIDER_SITE_OTHER): Payer: Self-pay

## 2014-05-04 DIAGNOSIS — I639 Cerebral infarction, unspecified: Secondary | ICD-10-CM

## 2014-05-04 DIAGNOSIS — Z0289 Encounter for other administrative examinations: Secondary | ICD-10-CM

## 2014-05-14 NOTE — Progress Notes (Signed)
No note to attach. Will append later on.

## 2014-05-26 ENCOUNTER — Other Ambulatory Visit: Payer: Self-pay | Admitting: Nurse Practitioner

## 2014-05-27 NOTE — Telephone Encounter (Signed)
Patient is seen through research

## 2014-07-24 ENCOUNTER — Ambulatory Visit (INDEPENDENT_AMBULATORY_CARE_PROVIDER_SITE_OTHER): Payer: BC Managed Care – PPO | Admitting: Internal Medicine

## 2014-07-24 ENCOUNTER — Encounter: Payer: Self-pay | Admitting: Internal Medicine

## 2014-07-24 ENCOUNTER — Encounter (INDEPENDENT_AMBULATORY_CARE_PROVIDER_SITE_OTHER): Payer: Self-pay

## 2014-07-24 VITALS — BP 128/76 | HR 63 | Ht 65.0 in | Wt 184.4 lb

## 2014-07-24 DIAGNOSIS — IMO0001 Reserved for inherently not codable concepts without codable children: Secondary | ICD-10-CM

## 2014-07-24 DIAGNOSIS — G4733 Obstructive sleep apnea (adult) (pediatric): Secondary | ICD-10-CM

## 2014-07-24 DIAGNOSIS — J4520 Mild intermittent asthma, uncomplicated: Secondary | ICD-10-CM

## 2014-07-24 NOTE — Assessment & Plan Note (Signed)
Mild persistent exertional dyspnea with occasional wheeze. Doesn't like sympathetic stimulation from albuterol, so under-uses. Pl  Flou vaxan- try sample spiriva first, but may be candidate for steroid inhaler.

## 2014-07-24 NOTE — Progress Notes (Signed)
02/06/14- 52 yoF former 2ppd/ 60 pk yr smoker seen for OSA, dyspnea, complicated by hx CVA Has been folllowed for OSA by Dr Shelle Iron, complicated by CVA/ patent foramen ovale, anxiety. PRESENTS FOR: Has seen [facility] for sleep, but asks I follow that problem along with her breathing issues..  Pt c/o increased SOB with exertion, has been worse since her stroke on 03/26/2013.   CPAP ?10/ [facility]. fullface mask is noisy. Has humidifier. Dyspnea since left body CVA in June of 2014. Dyspnea on exertion walking room to room. Denies cough or wheeze. Ventolin inhaler does not help. No leg weakness. She paces herself. Needing total knee replacement for arthritis which also limits activity. CXR 05/29/13 IMPRESSION:  No acute abnormalities seen.  Original Report Authenticated By: Alcide Clever, M.D. PFT 11/03/2013-mild obstructive airways disease with significant response to bronchodilator, normal lung volumes, mild diffusion defect. FVC 3.02/87%, FEV1 2.60/96%, FEV1/FVC 0.86, FEF 25-75% 4.50/174%, TLC 98%, DLCO 69%.  03/30/14- 53 yoF former 2ppd/ 60 pk yr smoker seen for OSA, dyspnea, complicated by hx CVA Has been folllowed for OSA by Dr Shelle Iron, complicated by CVA/ patent foramen ovale, anxiety. Husband here FOLLOWS FOR: Pt states the Anoro is not helping, c/o an increase in cough. Pt states she is wearing CPAP nightly (hasnt used CPAP in one week d/t dental work), wearing CPAP about 10 hours per night. Pt denies issues with mask, pressure and machine.  Anoro made her cough more- dry or clear mucus  07/24/14- 53 yoF former 2ppd/ 60 pk yr smoker seen for OSA, dyspnea, complicated by hx CVA Had been folllowed for OSA by Dr Shelle Iron, complicated by CVA/ patent foramen ovale, anxiety FOLLOWS FOR: Wears CPAP 10/ [facility] every night for about 8-16 hours; pressure works well for patient. DME is [facility].  Download pending    Husband here Tonsillectomy 04/01/14/ Dr Suszanne Conners Being followed by neurology for ?  seizures Breathing still easy DOE. Occ tight or wheeze. Uses rescue inhaler 1x/ week- dislikes stimulation.  ROS-see HPI Constitutional:   No-   weight loss, night sweats, fevers, chills, fatigue, lassitude. HEENT:   + headaches, No-difficulty swallowing, tooth/dental problems, sore throat,       No-  sneezing, itching, ear ache, nasal congestion, post nasal drip,  CV:  No-   chest pain, orthopnea, PND, swelling in lower extremities, anasarca,                                                      dizziness, palpitations Resp: +shortness of breath with exertion or at rest.              No-   productive cough,  No non-productive cough,  No- coughing up of blood.              No-   change in color of mucus.  +wheezing.   Skin: No-   rash or lesions. GI:  No-   heartburn, indigestion, abdominal pain, nausea, vomiting, GU: . MS:  +joint pain or swelling.  . Neuro-     ?seizures Psych:  No- change in mood or affect. No depression or anxiety.  No memory loss.  OBJ- Physical Exam General- Alert, Oriented, Affect-appropriate, Distress- none acute Skin- rash-none, lesions- none, excoriation- none Lymphadenopathy- none Head- atraumatic  Eyes- Gross vision intact, PERRLA, conjunctivae and secretions clear            Ears- gross hearing normal            Nose- Clear, no-Septal dev, mucus, polyps, erosion, perforation             Throat- Mallampati IV , mucosa clear , drainage- none, tonsils-absent,  Neck- flexible , trachea midline, no stridor , thyroid nl, carotid no bruit Chest - symmetrical excursion , unlabored           Heart/CV- RRR , no murmur , no gallop  , no rub, nl s1 s2                           - JVD- none , edema- none, stasis changes- none, varices- none           Lung- clear to P&A, wheeze- none, cough- none , dullness-none, rub- none           Chest wall-  Abd-  Br/ Gen/ Rectal- Not done, not indicated Extrem- cyanosis- none, clubbing, none, atrophy- none, strength-  nl Neuro- grossly intact to observation

## 2014-07-24 NOTE — Assessment & Plan Note (Addendum)
CPAP 10/ [facility] She and husband describe good compliance and control Plan- download requested for pressure compliance

## 2014-07-24 NOTE — Patient Instructions (Signed)
Flu vax  Sample Spiriva 1 daily  Ok to still use the Proair 1-2 puffs every 4 hours if needed  Please call as needed

## 2014-08-10 ENCOUNTER — Encounter: Payer: Self-pay | Admitting: Neurology

## 2014-08-10 ENCOUNTER — Ambulatory Visit (INDEPENDENT_AMBULATORY_CARE_PROVIDER_SITE_OTHER): Payer: BC Managed Care – PPO | Admitting: Neurology

## 2014-08-10 VITALS — BP 112/69 | HR 42 | Ht 65.0 in | Wt 181.8 lb

## 2014-08-10 DIAGNOSIS — G40219 Localization-related (focal) (partial) symptomatic epilepsy and epileptic syndromes with complex partial seizures, intractable, without status epilepticus: Secondary | ICD-10-CM

## 2014-08-10 DIAGNOSIS — G43819 Other migraine, intractable, without status migrainosus: Secondary | ICD-10-CM

## 2014-08-10 DIAGNOSIS — G40209 Localization-related (focal) (partial) symptomatic epilepsy and epileptic syndromes with complex partial seizures, not intractable, without status epilepticus: Secondary | ICD-10-CM | POA: Insufficient documentation

## 2014-08-10 DIAGNOSIS — G43919 Migraine, unspecified, intractable, without status migrainosus: Secondary | ICD-10-CM | POA: Insufficient documentation

## 2014-08-10 MED ORDER — DIVALPROEX SODIUM ER 500 MG PO TB24: 1000.0000 mg | ORAL_TABLET | Freq: Two times a day (BID) | ORAL | Status: DC

## 2014-08-10 NOTE — Progress Notes (Signed)
[facility]  PATIENT: [name] DOB: Aug 20, 1961   HISTORY FROM: patient, chart REASON FOR VISIT: routine follow up  HISTORY OF PRESENT ILLNESS:  05/02/13 (PS): 53 year old Caucasian lady being seen today for the first office followup visit for hospital admission for stroke. She presented with sudden onset of unstable gait and dizziness on 03/25/13. CT scan of the head showed equal local right frontal low-density and subsequently MRI scan of the brain confirmed right parietal acute ischemic infarct. MRA of the brain showed diffuse mild intracranial atherosclerotic changes without any large vessel occlusion. Lipid profile and hemoglobin A1c were normal. MRA of the brain showed a tiny incidental 1.2 mm right cavernous internal carotid artery aneurysm. Transthoracic echo showed normal ejection fraction. Carotid ultrasounds were unremarkable. She was not found to have significant vascular risk factors. Except for sleep apnea for which she does use CPAP mask every night. Extensive lab work done in the hospital included basic metabolic panel, ANA, ESR, complement levels, RPR, hypercoagulable panel all of which were negative. She states that she's been having headaches which are almost daily since discharge. She has been taking tramadol which helps but only for short while. She also complains of some intermittent dizziness and some left-sided incoordination which is improving but is not back to normal.  Returned to hospital on 05/29/13, after being found by her husband on the ground rocking back and forth screaming loudly clutching her chest. At the time pt would not answer her husband, just screaming in pain. Pt does not recall what happened. She thinks she lost 3 hours of time where she cannot recollect what happened. Was found in work up to have a PFO and has been enrolled in a study and followed by Dr.Sethi as an outpatient. Patient reports that at baseline she has some difficulties  with reasoning and ambulates with a cane. Reports daily headaches since that time as well.   UPDATE 06/03/13 (LL):  Patient comes in for stroke follow up since last visit on 05/02/13.  She is wearing the cardiac event monitor, will be finished on Friday, sees Dr Einar Gip then.  Headaches are some better, taking 120m Topamax daily at bedtime.  Headache is still every day, taking Tramadol 2-4 pills every day, 2 at a time.  Increased problems with adding, calculations, completing tasks, cooking.  Short term memory problems.  Reports trouble breathing at times and feelings of lightheadedness.  Paresthesias in bilateral hands and feel when standing for long periods.  No prior history of breathing difficulty except for mild asthma.  Tearful in office.  Taking daily aspirin for secondary stroke prevention.  Patient denies medication side effects, with no signs of bleeding or excessive bruising.   Update 08/10/2014 : She returns for clinical follow up visit in the office after last visit more than a year ago. She has noted significant worsening of her migraine headaches in the last several months and now she's been having almost daily headaches. Headaches are severe and disabling constant. They're very in severity from 5-10/10. They're sharp sudden and throbbing in nature. Headaches have increased her physical activity and she has photophobia, nausea and occasional vomiting. The headache is reduced by sleep. She takes 2-4 tablets of tramadol daily with only partial relief. She also takes 2 tablets of hydrocodone 5/325 for her chronic shoulder pain from rotator cuff injury. She is currently on Depakote ER 500 twice daily and Topamax 50 mg twice daily. The Depakote was working quite well initially but in the  last   several months has not worked as well. She also continues to have episodes of possible complex partial seizures. The husband has witnessed several of these states that she start staining is briefly unresponsive  has minor jerking of her extremities and subsequently wets her pants. She had a great response to Depakote when it was initially started but for the last couple of months these have increased in frequency to now almost once a week. She has applied for and gotten Social Security disability but wants me to do some paperwork for her [place] state long-term disability. She continues to followup in the [name] PFO stroke trial and was last seen in July 2015 REVIEW OF SYSTEMS: Full 14 system review of systems performed and notable only for: Fatigue, hearing loss, ear pain, ringing in the ears, lites and 30, loss of vision, shortness or breath, chest pain, environmental and food allergies, incontinence of bladder, nausea, apnea, frequent waking, daytime sleepiness, snoring, sleep walking, acting out dreams, joint pain and swelling, aching muscles, walking difficulty, memory loss, headache, numbness, seizure, tremors, facial drooping, confusion, depression, nervousness and anxiety.    ALLERGIES: Allergies  Allergen Reactions  . Amoxicillin     REACTION: GI upset  . Aspirin     REACTION: severe gi upset.  Pt states she can tolerate 81 mg asa only.   . Ciprofloxacin     REACTION: angioedema, urticaria  . Cosyntropin     REACTION: swelling, hives  . Guaifenesin     REACTION: head rash  . Moxifloxacin Other (See Comments)    gi upset  . Nsaids Other (See Comments)    Gi upset  . Sulfonamide Derivatives     REACTION: swelling, tongue and hives    HOME MEDICATIONS: Outpatient Prescriptions Prior to Visit  Medication Sig Dispense Refill  . albuterol (PROVENTIL HFA;VENTOLIN HFA) 108 (90 BASE) MCG/ACT inhaler Inhale 2 puffs into the lungs every 6 (six) hours as needed for wheezing or shortness of breath.      . ALPRAZolam (XANAX) 0.5 MG tablet Take 0.5 mg by mouth 2 (two) times daily as needed for anxiety.       Marland Kitchen aspirin 81 MG tablet Take 81 mg by mouth daily.      . citalopram (CELEXA) 20 MG  tablet Take 20 mg by mouth daily.      . clobetasol ointment (TEMOVATE) 7.06 % Apply 1 application topically 2 (two) times daily as needed (inflammation on feet.).       Marland Kitchen cycloSPORINE (RESTASIS) 0.05 % ophthalmic emulsion Place 4 drops into both eyes 2 (two) times daily.      . fluticasone (FLONASE) 50 MCG/ACT nasal spray Place 2 sprays into both nostrils daily as needed for allergies.       . folic acid (FOLVITE) 1 MG tablet Take 1 mg by mouth daily.      . InFLIXimab (REMICADE IV) Inject 3 mg/kg into the vein every 6 (six) weeks.       Marland Kitchen loratadine (CLARITIN) 10 MG tablet Take 10 mg by mouth daily.      . Methotrexate, PF, 10 MG/0.4ML SOAJ Inject 10 mg into the skin once a week. Friday      . Multiple Vitamin (MULTIVITAMIN) capsule Take 1 capsule by mouth daily.       . NON FORMULARY Place 1 spray into the nose 2 (two) times daily as needed. Compounded Lidocaine Spray.  Use as directed for headaches. [facility])      .  ondansetron (ZOFRAN) 4 MG tablet Take 1 tablet (4 mg total) by mouth every 8 (eight) hours as needed for nausea.  20 tablet  0  . pantoprazole (PROTONIX) 40 MG tablet Take 40 mg by mouth daily.      . pravastatin (PRAVACHOL) 20 MG tablet Take 20 mg by mouth daily.      . Probiotic Product (PROBIOTIC DAILY PO) Take 1 capsule by mouth daily.      Marland Kitchen topiramate (TOPAMAX) 50 MG tablet TAKE ONE TABLET BY MOUTH TWICE DAILY  180 tablet  1  . traMADol (ULTRAM) 50 MG tablet Take 100 mg by mouth 2 (two) times daily as needed for moderate pain.       . divalproex (DEPAKOTE ER) 500 MG 24 hr tablet Take 500 mg by mouth 2 (two) times daily.      . ciprofloxacin-dexamethasone (CIPRODEX) otic suspension Place 4 drops into the right ear 2 (two) times daily.       No facility-administered medications prior to visit.    PAST MEDICAL HISTORY: Past Medical History  Diagnosis Date  . IBS (irritable bowel syndrome)     mixed  . Allergic rhinitis     uses Flonase daily as needed and  takes CLaritin daily  . Eczema   . Plaque psoriasis   . Cerebral vascular malformation     sees dr Arnoldo Morale for monitoring as needed, sees dr lewitt for headaches every 4 months  . Lactose intolerance   . Anxiety     takes Xanax daily as needed  . Asthma     Albuterol inhaler prn;SYmbicort daily  . Depression     takes Citalopram daily  . Rheumatoid arthritis(714.0) 2010    oa and ra;Rhemicade IV every 6wks and Metotrexate weekly  . Nausea     takes Zofran daily as needed  . GERD (gastroesophageal reflux disease)     takes Protonix daily  . Hyperlipidemia     takes Pravastatin daily  . PONV (postoperative nausea and vomiting)   . Shortness of breath     with exertion  . Pneumonia 30yr ago    hx of  . History of bronchitis as a child   . History of migraine     last one 10+yrs ago  . Seizures 676monthago    takes Depakote daily  . Stroke 03/26/2013    left sided weakness  . Joint pain   . Joint swelling   . Fibromyalgia   . Diverticulitis at age 53. History of kidney stones   . Thyroid cyst   . Insomnia   . Sleep apnea     study done >5y19yrgo;uses CPAP nightly  . History of staph infection [id number]  . Ventricular septal defect     PAST SURGICAL HISTORY: Past Surgical History  Procedure Laterality Date  . Incontinence surgery  2010    sling done   . Knee arthroscopy  1992    left  . Foot surgery  1999    right ankle  . Kidney stone surgery  2001  . Appendectomy  1981  . Cholecystectomy  1981  . Tubal ligation  1990  . Total abdominal hysterectomy  2001  . Hernia repair  2006  . Left foot plating and scarping for arthritis  2011  . Right ear tube insertion  2011  . Incisional hernia repair  05/20/2012    Procedure: HERNIA REPAIR INCISIONAL;  Surgeon: ThomJoyice Fasterrnett, MD;  Location: [facility];  Service: General;  Laterality: N/A;  . Tee without cardioversion N/A 05/15/2013    Procedure: TRANSESOPHAGEAL ECHOCARDIOGRAM (TEE);  Surgeon: Laverda Page, MD;   Location: [place];  Service: Cardiovascular;  Laterality: N/A;  . Cardiac catheterization  2004  . Colonoscopy    . Thryoid biopsy    . Tonsillectomy and adenoidectomy  04/01/2014  . Tonsillectomy and adenoidectomy Bilateral 04/01/2014    Procedure: BILATERAL TONSILLECTOMY AND ADENOIDECTOMY;  Surgeon: Ascencion Dike, MD;  Location: [facility];  Service: ENT;  Laterality: Bilateral;    FAMILY HISTORY: Family History  Problem Relation Age of Onset  . Diabetes Maternal Grandmother   . Arthritis Maternal Grandmother   . Diabetes Brother   . Diabetes Paternal Grandmother   . Diabetes Maternal Grandfather   . Diabetes Paternal Grandfather   . Heart disease Brother   . Heart disease Other     Uncle  . Breast cancer Maternal Aunt   . Ovarian cancer Mother     SOCIAL HISTORY: History   Social History  . Marital Status: Married    Spouse Name: Shanon Brow    Number of Children: 3  . Years of Education: College   Occupational History  . Chemical engineer   .  [place] History Main Topics  . Smoking status: Former Smoker -- 2.00 packs/day for 30 years    Types: Cigarettes  . Smokeless tobacco: Never Used     Comment: quit smoking in 2002  . Alcohol Use: No  . Drug Use: No  . Sexual Activity: Yes   Other Topics Concern  . Not on file   Social History Narrative   Patient is married with 3 children.   Patient is right handed.   Patient has college education.   Caffeine Use: 1.5 cup of coffee in a.m     PHYSICAL EXAM  Filed Vitals:   08/10/14 0846  BP: 112/69  Pulse: 42  Height: _0  (1.651 m)  Weight: 181 lb 12.8 oz (82.464 kg)   Body mass index is 30.25 kg/(m^2).  Physical Exam  General: well developed, well nourished middle aged Caucasian aldy, seated Head: head normocephalic and atraumatic.   Neck:  no carotid or supraclavicular bruits .mild posterior cervical muscles spasm and tenderness Cardiovascular: regular rate and  rhythm, no murmurs  Musculoskeletal: no deformity left shoulder elevation limited by pain Skin: no rash/petichiae   Vascular: Normal pulses all extremities  Neurologic Exam  Mental Status: Awake and fully alert. Oriented to place and time. Recent and remote memory intact. Attention span, concentration and fund of knowledge appropriate. Mood and affect are  anxious, tearful.  MMSE  Not done  Cranial Nerves: Fundoscopic exam reveals sharp disc margins. Pupils equal, briskly reactive to light. Extraocular movements full without nystagmus. Visual fields show partial left temporal visual field deficit to confrontation. Hearing intact. Facial sensation intact. Face, tongue, palate moves normally and symmetrically.  Motor: Normal bulk and tone. Normal strength in all tested extremity muscles.Diminished fine finger movements on left and orbits right over left upper extremity. Patient has bilateral give-way weakness but no focality  Left Shoulder movements limited by pain Sensory: intact to touch and pinprick and vibratory.  Coordination: Rapid alternating movements normal in all extremities. Finger-to-nose and heel-to-shin performed accurately bilaterally.  Gait and Station: Arises from chair without difficulty. Stance is normal. Gait demonstrates slow, normal stride length and balance. Able to heel, toe and tandem walk with mild  difficulty.  . Romberg negative. Reflexes: 1+ and symmetric. Toes downgoing.  DIAGNOSTIC DATA (LABS, IMAGING, TESTING) - I reviewed patient records, labs, notes, testing and imaging myself where available.  Lab Results  Component Value Date   WBC 6.9 03/31/2014   HGB 13.2 03/31/2014   HCT 38.4 03/31/2014   MCV 100.3* 03/31/2014   PLT 185 03/31/2014      Component Value Date/Time   NA 143 03/31/2014 1531   K 4.3 03/31/2014 1531   CL 107 03/31/2014 1531   CO2 22 03/31/2014 1531   GLUCOSE 90 03/31/2014 1531   BUN 11 03/31/2014 1531   CREATININE 0.56 03/31/2014 1531   CALCIUM 9.3  03/31/2014 1531   PROT 7.0 05/10/2012 1350   ALBUMIN 3.8 05/10/2012 1350   AST 15 05/10/2012 1350   ALT 10 05/10/2012 1350   ALKPHOS 82 05/10/2012 1350   BILITOT 0.2* 05/10/2012 1350   GFRNONAA >90 03/31/2014 1531   GFRAA >90 03/31/2014 1531   Lab Results  Component Value Date   CHOL 139 03/27/2013   HDL 23* 03/27/2013   LDLCALC 89 03/27/2013   TRIG 135 03/27/2013   CHOLHDL 6.0 03/27/2013   Lab Results  Component Value Date   HGBA1C 5.6 03/27/2013   TCD 05/01/13 Positive Transcranial Doppler Bubble Study indicative of moderate size right to left intracardiac or intrapulmonary shunt.  TEE 05/15/13 Large PFO with strongly positive double contrast for bidirectional shunting both at rest and Valsalva. Marland Kitchen PFO measures 23m and tunnel length 15 mm. Otherwise normal TEE. Ct Head Wo Contrast 05/29/2013  No acute intracranial abnormality is identified. Encephalomalacia in the right parietal lobe is compatible with the parietal lobe infarct that occurred in June 2014. Stable right frontal lobe cavernoma.  EEG ADULT 05/30/13  This is a normal awake and drowsy EEG. Please, be aware that a normal EEG does not exclude the possibility of epilepsy. Clinical correlation is advised.   Mr Brain Wo Contrast  05/30/2013  Expected evolutionary change related to chronic right parietal infarct. No new infarction is seen. Unchanged right frontal cavernoma.     ASSESSMENT AND PLAN Ms. CSHONTEL SANTEEis a 53y.o. female presenting with dizziness, weakness and headache in June 2014 from acute non hemorrhagic small to moderate size right parietal lobe infarct felt to be embolic.  Significant vascular risk factors are sleep apnea and large PFO confirmed by bubble study and TEE.   Headaches post stroke likely mixed migraine and tension headaches which habe now become refractory and allmost daily.  Complex partial seizures with recent flare up Continue aspirin 81 mg orally every day  for secondary stroke prevention and maintain strict  control of hypertension with blood pressure goal below 130/90, diabetes with hemoglobin A1c goal below 6.5% and lipids with LDL cholesterol goal below 100 mg/dL.I had a long discussion with the patient and her husband with regards to her refractory migraine headaches which have now become chronic daily. She has failed trials of Topamax and Depakote and hence we'll consider Botox after approval from insurance. Increase Depakote ER 2000 mg twice daily if tolerated since she is having refractory seizures as well as headaches. I have given her prescription and discussed side effects and advised her to call me. The patient has turned in long-term disability paperwork filled out. Return for follow up after Botox approval         PAntony Contras MD  08/10/2014, 9:42 AM  [facility] [address] [place] [phone]

## 2014-08-10 NOTE — Patient Instructions (Signed)
I had a long discussion with the patient and her husband with regards to her refractory migraine headaches which have now become chronic daily. She has failed trials of Topamax and Depakote and hence we'll consider Botox after approval from insurance. Increase Depakote ER 2000 mg twice daily if tolerated since she is having refractory seizures as well as headaches. I have given her prescription and discussed side effects and advised her to call me. The patient has turned in long-term disability paperwork filled out. Return for follow up after Botox approval  Migraine Headache A migraine headache is an intense, throbbing pain on one or both sides of your head. A migraine can last for 30 minutes to several hours. CAUSES  The exact cause of a migraine headache is not always known. However, a migraine may be caused when nerves in the brain become irritated and release chemicals that cause inflammation. This causes pain. Certain things may also trigger migraines, such as:  Alcohol.  Smoking.  Stress.  Menstruation.  Aged cheeses.  Foods or drinks that contain nitrates, glutamate, aspartame, or tyramine.  Lack of sleep.  Chocolate.  Caffeine.  Hunger.  Physical exertion.  Fatigue.  Medicines used to treat chest pain (nitroglycerine), birth control pills, estrogen, and some blood pressure medicines. SIGNS AND SYMPTOMS  Pain on one or both sides of your head.  Pulsating or throbbing pain.  Severe pain that prevents daily activities.  Pain that is aggravated by any physical activity.  Nausea, vomiting, or both.  Dizziness.  Pain with exposure to bright lights, loud noises, or activity.  General sensitivity to bright lights, loud noises, or smells. Before you get a migraine, you may get warning signs that a migraine is coming (aura). An aura may include:  Seeing flashing lights.  Seeing bright spots, halos, or zigzag lines.  Having tunnel vision or blurred  vision.  Having feelings of numbness or tingling.  Having trouble talking.  Having muscle weakness. DIAGNOSIS  A migraine headache is often diagnosed based on:  Symptoms.  Physical exam.  A CT scan or MRI of your head. These imaging tests cannot diagnose migraines, but they can help rule out other causes of headaches. TREATMENT Medicines may be given for pain and nausea. Medicines can also be given to help prevent recurrent migraines.  HOME CARE INSTRUCTIONS  Only take over-the-counter or prescription medicines for pain or discomfort as directed by your health care provider. The use of long-term narcotics is not recommended.  Lie down in a dark, quiet room when you have a migraine.  Keep a journal to find out what may trigger your migraine headaches. For example, write down:  What you eat and drink.  How much sleep you get.  Any change to your diet or medicines.  Limit alcohol consumption.  Quit smoking if you smoke.  Get 7-9 hours of sleep, or as recommended by your health care provider.  Limit stress.  Keep lights dim if bright lights bother you and make your migraines worse. SEEK IMMEDIATE MEDICAL CARE IF:   Your migraine becomes severe.  You have a fever.  You have a stiff neck.  You have vision loss.  You have muscular weakness or loss of muscle control.  You start losing your balance or have trouble walking.  You feel faint or pass out.  You have severe symptoms that are different from your first symptoms. MAKE SURE YOU:   Understand these instructions.  Will watch your condition.  Will get help right away  if you are not doing well or get worse. Document Released: 10/02/2005 Document Revised: 02/16/2014 Document Reviewed: 06/09/2013 [facility] Patient Information 2015 [place], [place]. This information is not intended to replace advice given to you by your health care provider. Make sure you discuss any questions you have with your health care  provider.

## 2014-08-11 DIAGNOSIS — Z0289 Encounter for other administrative examinations: Secondary | ICD-10-CM

## 2014-08-14 ENCOUNTER — Other Ambulatory Visit (INDEPENDENT_AMBULATORY_CARE_PROVIDER_SITE_OTHER): Payer: BC Managed Care – PPO

## 2014-08-14 DIAGNOSIS — G40219 Localization-related (focal) (partial) symptomatic epilepsy and epileptic syndromes with complex partial seizures, intractable, without status epilepticus: Secondary | ICD-10-CM

## 2014-09-08 ENCOUNTER — Telehealth: Payer: Self-pay | Admitting: Neurology

## 2014-09-08 MED ORDER — CITALOPRAM HYDROBROMIDE 20 MG PO TABS
40.0000 mg | ORAL_TABLET | Freq: Every day | ORAL | Status: DC
Start: 2014-09-08 — End: 2015-07-24

## 2014-09-08 NOTE — Telephone Encounter (Signed)
Per last OV with NP: Increase Celexa to 40 mg daily (2 tablets daily) for depression

## 2014-09-08 NOTE — Telephone Encounter (Signed)
Spoke with patient and informed her of normal EEG results. Shanda Bumps can you please fix her prescription.

## 2014-09-08 NOTE — Telephone Encounter (Signed)
Pt calling wanting to know the results of her EEG she had on 10/30.  Pt also states that her Rx for citalopram (CELEXA) 20 MG tablet should be for 2 a day and right now it is for 1.  Please check this and call back and advise.

## 2014-09-20 ENCOUNTER — Encounter (HOSPITAL_COMMUNITY): Payer: Self-pay | Admitting: Emergency Medicine

## 2014-09-20 ENCOUNTER — Emergency Department (HOSPITAL_COMMUNITY)
Admission: EM | Admit: 2014-09-20 | Discharge: 2014-09-20 | Disposition: A | Discharge status: Home / Self Care | Payer: BC Managed Care – PPO | Attending: Emergency Medicine | Admitting: Emergency Medicine

## 2014-09-20 DIAGNOSIS — Z9889 Other specified postprocedural states: Secondary | ICD-10-CM | POA: Insufficient documentation

## 2014-09-20 DIAGNOSIS — R319 Hematuria, unspecified: Secondary | ICD-10-CM

## 2014-09-20 DIAGNOSIS — Z87442 Personal history of urinary calculi: Secondary | ICD-10-CM | POA: Insufficient documentation

## 2014-09-20 DIAGNOSIS — M069 Rheumatoid arthritis, unspecified: Secondary | ICD-10-CM | POA: Diagnosis not present

## 2014-09-20 DIAGNOSIS — K219 Gastro-esophageal reflux disease without esophagitis: Secondary | ICD-10-CM | POA: Diagnosis not present

## 2014-09-20 DIAGNOSIS — Z9981 Dependence on supplemental oxygen: Secondary | ICD-10-CM | POA: Diagnosis not present

## 2014-09-20 DIAGNOSIS — Z7952 Long term (current) use of systemic steroids: Secondary | ICD-10-CM | POA: Insufficient documentation

## 2014-09-20 DIAGNOSIS — G4730 Sleep apnea, unspecified: Secondary | ICD-10-CM | POA: Diagnosis not present

## 2014-09-20 DIAGNOSIS — Q210 Ventricular septal defect: Secondary | ICD-10-CM | POA: Insufficient documentation

## 2014-09-20 DIAGNOSIS — F329 Major depressive disorder, single episode, unspecified: Secondary | ICD-10-CM | POA: Diagnosis not present

## 2014-09-20 DIAGNOSIS — N201 Calculus of ureter: Secondary | ICD-10-CM | POA: Insufficient documentation

## 2014-09-20 DIAGNOSIS — Z791 Long term (current) use of non-steroidal anti-inflammatories (NSAID): Secondary | ICD-10-CM | POA: Diagnosis not present

## 2014-09-20 DIAGNOSIS — Z7951 Long term (current) use of inhaled steroids: Secondary | ICD-10-CM | POA: Insufficient documentation

## 2014-09-20 DIAGNOSIS — Z880 Allergy status to penicillin: Secondary | ICD-10-CM | POA: Diagnosis not present

## 2014-09-20 DIAGNOSIS — G40909 Epilepsy, unspecified, not intractable, without status epilepticus: Secondary | ICD-10-CM | POA: Insufficient documentation

## 2014-09-20 DIAGNOSIS — F419 Anxiety disorder, unspecified: Secondary | ICD-10-CM | POA: Insufficient documentation

## 2014-09-20 DIAGNOSIS — Z79899 Other long term (current) drug therapy: Secondary | ICD-10-CM | POA: Diagnosis not present

## 2014-09-20 DIAGNOSIS — Z8673 Personal history of transient ischemic attack (TIA), and cerebral infarction without residual deficits: Secondary | ICD-10-CM | POA: Insufficient documentation

## 2014-09-20 DIAGNOSIS — R109 Unspecified abdominal pain: Secondary | ICD-10-CM | POA: Diagnosis present

## 2014-09-20 DIAGNOSIS — N200 Calculus of kidney: Secondary | ICD-10-CM

## 2014-09-20 DIAGNOSIS — G4700 Insomnia, unspecified: Secondary | ICD-10-CM | POA: Diagnosis not present

## 2014-09-20 DIAGNOSIS — Z7982 Long term (current) use of aspirin: Secondary | ICD-10-CM | POA: Insufficient documentation

## 2014-09-20 DIAGNOSIS — Z872 Personal history of diseases of the skin and subcutaneous tissue: Secondary | ICD-10-CM | POA: Diagnosis not present

## 2014-09-20 DIAGNOSIS — J45909 Unspecified asthma, uncomplicated: Secondary | ICD-10-CM | POA: Diagnosis not present

## 2014-09-20 DIAGNOSIS — Z87891 Personal history of nicotine dependence: Secondary | ICD-10-CM | POA: Diagnosis not present

## 2014-09-20 DIAGNOSIS — G43909 Migraine, unspecified, not intractable, without status migrainosus: Secondary | ICD-10-CM | POA: Insufficient documentation

## 2014-09-20 DIAGNOSIS — Z8619 Personal history of other infectious and parasitic diseases: Secondary | ICD-10-CM | POA: Diagnosis not present

## 2014-09-20 DIAGNOSIS — E785 Hyperlipidemia, unspecified: Secondary | ICD-10-CM | POA: Insufficient documentation

## 2014-09-20 LAB — CBC WITH DIFFERENTIAL/PLATELET
Basophils Absolute: 0 10*3/uL (ref 0.0–0.1)
Basophils Relative: 0 % (ref 0–1)
EOS ABS: 0 10*3/uL (ref 0.0–0.7)
EOS PCT: 0 % (ref 0–5)
HEMATOCRIT: 37.9 % (ref 36.0–46.0)
Hemoglobin: 13 g/dL (ref 12.0–15.0)
LYMPHS ABS: 0.9 10*3/uL (ref 0.7–4.0)
LYMPHS PCT: 10 % — AB (ref 12–46)
MCH: 35 pg — AB (ref 26.0–34.0)
MCHC: 34.3 g/dL (ref 30.0–36.0)
MCV: 102.2 fL — ABNORMAL HIGH (ref 78.0–100.0)
MONO ABS: 0.8 10*3/uL (ref 0.1–1.0)
Monocytes Relative: 9 % (ref 3–12)
Neutro Abs: 7.2 10*3/uL (ref 1.7–7.7)
Neutrophils Relative %: 81 % — ABNORMAL HIGH (ref 43–77)
Platelets: 135 10*3/uL — ABNORMAL LOW (ref 150–400)
RBC: 3.71 MIL/uL — ABNORMAL LOW (ref 3.87–5.11)
RDW: 13.2 % (ref 11.5–15.5)
WBC: 8.8 10*3/uL (ref 4.0–10.5)

## 2014-09-20 LAB — URINALYSIS, ROUTINE W REFLEX MICROSCOPIC
BILIRUBIN URINE: NEGATIVE
GLUCOSE, UA: NEGATIVE mg/dL
Nitrite: NEGATIVE
PROTEIN: NEGATIVE mg/dL
Specific Gravity, Urine: 1.015 (ref 1.005–1.030)
Urobilinogen, UA: 0.2 mg/dL (ref 0.0–1.0)
pH: 7.5 (ref 5.0–8.0)

## 2014-09-20 LAB — COMPREHENSIVE METABOLIC PANEL
ALT: 42 U/L — ABNORMAL HIGH (ref 0–35)
AST: 26 U/L (ref 0–37)
Albumin: 3.3 g/dL — ABNORMAL LOW (ref 3.5–5.2)
Alkaline Phosphatase: 78 U/L (ref 39–117)
Anion gap: 15 (ref 5–15)
BUN: 15 mg/dL (ref 6–23)
CALCIUM: 8.8 mg/dL (ref 8.4–10.5)
CO2: 20 meq/L (ref 19–32)
CREATININE: 0.76 mg/dL (ref 0.50–1.10)
Chloride: 108 mEq/L (ref 96–112)
GLUCOSE: 152 mg/dL — AB (ref 70–99)
Potassium: 3.6 mEq/L — ABNORMAL LOW (ref 3.7–5.3)
Sodium: 143 mEq/L (ref 137–147)
TOTAL PROTEIN: 6.6 g/dL (ref 6.0–8.3)
Total Bilirubin: 0.3 mg/dL (ref 0.3–1.2)

## 2014-09-20 LAB — URINE MICROSCOPIC-ADD ON

## 2014-09-20 MED ORDER — MORPHINE SULFATE 2 MG/ML IJ SOLN
2.0000 mg | Freq: Once | INTRAMUSCULAR | Status: AC
  Administered 2014-09-20: 2 mg via INTRAVENOUS
  Filled 2014-09-20: qty 1

## 2014-09-20 MED ORDER — OXYCODONE-ACETAMINOPHEN 5-325 MG PO TABS
2.0000 | ORAL_TABLET | Freq: Once | ORAL | Status: AC
  Administered 2014-09-20: 2 via ORAL
  Filled 2014-09-20: qty 2

## 2014-09-20 MED ORDER — ONDANSETRON HCL 4 MG/2ML IJ SOLN
4.0000 mg | Freq: Once | INTRAMUSCULAR | Status: AC
  Administered 2014-09-20: 4 mg via INTRAVENOUS
  Filled 2014-09-20: qty 2

## 2014-09-20 MED ORDER — OXYCODONE-ACETAMINOPHEN 5-325 MG PO TABS: 2.0000 | ORAL_TABLET | ORAL | Status: DC | PRN

## 2014-09-20 MED ORDER — CEPHALEXIN 500 MG PO CAPS: 500.0000 mg | ORAL_CAPSULE | Freq: Four times a day (QID) | ORAL | Status: DC

## 2014-09-20 MED ORDER — ONDANSETRON 8 MG PO TBDP: 8.0000 mg | ORAL_TABLET | Freq: Three times a day (TID) | ORAL | Status: DC | PRN

## 2014-09-20 NOTE — ED Notes (Signed)
Pt reports left flank pain since this am. Pt reports n/v. Pt denies any dysuria or hematuria at this time. Pt reports kidney stone history.

## 2014-09-20 NOTE — ED Provider Notes (Addendum)
CSN: [id number]     Arrival date & time 09/20/14  1030 History  This chart was scribed for Hilario Quarry, MD by Modena Jansky, ED Scribe. This patient was seen in room APA04/APA04 and the patient's care was started at 12:29 PM   Chief Complaint  Patient presents with  . Flank Pain   Patient is a 53 y.o. female presenting with flank pain. The history is provided by the patient. No language interpreter was used.  Flank Pain This is a recurrent problem. The current episode started 3 to 5 hours ago. The problem occurs constantly. The problem has not changed since onset.Nothing aggravates the symptoms. Nothing relieves the symptoms. She has tried nothing for the symptoms.   HPI Comments: [name] is a 53 y.o. female with a hx of kidney stones who presents to the Emergency Department complaining of constant moderate left sided flank pain that started about 4 hours ago. She reports that the pain woke her up this morning. She states that pain feels similar to a kidney stone and that this would be her 4th one. She rates her pain currently as a 9/10 and 10/10 at its worst. She describes the pain as a sharp, cramping sensation. She reports that she has associated nausea and emesis. She states that she took hydrocodone, but threw it back up. She reports that she ambulates with a cane. She states that she has a hx of cholecytsectomy, hysterectomy, and stroke. She reports that she is a former smoker.  Past Medical History  Diagnosis Date  . IBS (irritable bowel syndrome)     mixed  . Allergic rhinitis     uses Flonase daily as needed and takes CLaritin daily  . Eczema   . Plaque psoriasis   . Cerebral vascular malformation     sees dr Lovell Sheehan for monitoring as needed, sees dr lewitt for headaches every 4 months  . Lactose intolerance   . Anxiety     takes Xanax daily as needed  . Asthma     Albuterol inhaler prn;SYmbicort daily  . Depression     takes Citalopram daily  . Rheumatoid  arthritis(714.0) 2010    oa and ra;Rhemicade IV every 6wks and Metotrexate weekly  . Nausea     takes Zofran daily as needed  . GERD (gastroesophageal reflux disease)     takes Protonix daily  . Hyperlipidemia     takes Pravastatin daily  . PONV (postoperative nausea and vomiting)   . Shortness of breath     with exertion  . Pneumonia 43yrs ago    hx of  . History of bronchitis as a child   . History of migraine     last one 10+yrs ago  . Seizures 61months ago    takes Depakote daily  . Stroke 03/26/2013    left sided weakness  . Joint pain   . Joint swelling   . Fibromyalgia   . Diverticulitis at age 21  . History of kidney stones   . Thyroid cyst   . Insomnia   . Sleep apnea     study done >21yrs ago;uses CPAP nightly  . History of staph infection 36yrs ago  . Ventricular septal defect    Past Surgical History  Procedure Laterality Date  . Incontinence surgery  2010    sling done   . Knee arthroscopy  1992    left  . Foot surgery  1999    right ankle  . Kidney stone  surgery  2001  . Appendectomy  1981  . Cholecystectomy  1981  . Tubal ligation  1990  . Total abdominal hysterectomy  2001  . Hernia repair  2006  . Left foot plating and scarping for arthritis  2011  . Right ear tube insertion  2011  . Incisional hernia repair  05/20/2012    Procedure: HERNIA REPAIR INCISIONAL;  Surgeon: Clovis Pu. Cornett, MD;  Location: [facility] ORS;  Service: General;  Laterality: N/A;  . Tee without cardioversion N/A 05/15/2013    Procedure: TRANSESOPHAGEAL ECHOCARDIOGRAM (TEE);  Surgeon: Pamella Pert, MD;  Location: [facility] ENDOSCOPY;  Service: Cardiovascular;  Laterality: N/A;  . Cardiac catheterization  2004  . Colonoscopy    . Thryoid biopsy    . Tonsillectomy and adenoidectomy  04/01/2014  . Tonsillectomy and adenoidectomy Bilateral 04/01/2014    Procedure: BILATERAL TONSILLECTOMY AND ADENOIDECTOMY;  Surgeon: Darletta Moll, MD;  Location: [facility] OR;  Service: ENT;  Laterality: Bilateral;    Family History  Problem Relation Age of Onset  . Diabetes Maternal Grandmother   . Arthritis Maternal Grandmother   . Diabetes Brother   . Diabetes Paternal Grandmother   . Diabetes Maternal Grandfather   . Diabetes Paternal Grandfather   . Heart disease Brother   . Heart disease Other     Uncle  . Breast cancer Maternal Aunt   . Ovarian cancer Mother    History  Substance Use Topics  . Smoking status: Former Smoker -- 2.00 packs/day for 30 years    Types: Cigarettes  . Smokeless tobacco: Never Used     Comment: quit smoking in 2002  . Alcohol Use: No   OB History    No data available     Review of Systems  Genitourinary: Positive for flank pain.      Allergies  Amoxicillin; Aspirin; Ciprofloxacin; Cosyntropin; Guaifenesin; Moxifloxacin; Nsaids; and Sulfonamide derivatives  Home Medications   Prior to Admission medications   Medication Sig Start Date End Date Taking? Authorizing Provider  albuterol (PROVENTIL HFA;VENTOLIN HFA) 108 (90 BASE) MCG/ACT inhaler Inhale 2 puffs into the lungs every 6 (six) hours as needed for wheezing or shortness of breath.    Historical Provider, MD  ALPRAZolam Prudy Feeler) 0.5 MG tablet Take 0.5 mg by mouth 2 (two) times daily as needed for anxiety.     Historical Provider, MD  aspirin 81 MG tablet Take 81 mg by mouth daily.    Historical Provider, MD  citalopram (CELEXA) 20 MG tablet Take 2 tablets (40 mg total) by mouth daily. 09/08/14   Delia Heady, MD  clobetasol ointment (TEMOVATE) 0.05 % Apply 1 application topically 2 (two) times daily as needed (inflammation on feet.).  04/25/12   Historical Provider, MD  cycloSPORINE (RESTASIS) 0.05 % ophthalmic emulsion Place 4 drops into both eyes 2 (two) times daily.    Historical Provider, MD  divalproex (DEPAKOTE ER) 500 MG 24 hr tablet Take 2 tablets (1,000 mg total) by mouth 2 (two) times daily. Take one tablet am and two tablet pm x 2 weeks then increase to two tablets twice daily 08/10/14    Delia Heady, MD  fluticasone [facility]) 50 MCG/ACT nasal spray Place 2 sprays into both nostrils daily as needed for allergies.  05/29/13   Historical Provider, MD  folic acid (FOLVITE) 1 MG tablet Take 1 mg by mouth daily.    Historical Provider, MD  HYDROcodone-acetaminophen (NORCO/VICODIN) 5-325 MG per tablet Take 1 tablet by mouth every 6 (six) hours as needed  for moderate pain.    Historical Provider, MD  InFLIXimab (REMICADE IV) Inject 3 mg/kg into the vein every 6 (six) weeks.     Historical Provider, MD  loratadine (CLARITIN) 10 MG tablet Take 10 mg by mouth daily.    Historical Provider, MD  Methotrexate, PF, 10 MG/0.4ML SOAJ Inject 10 mg into the skin once a week. Friday    Historical Provider, MD  Multiple Vitamin (MULTIVITAMIN) capsule Take 1 capsule by mouth daily.     Historical Provider, MD  NON FORMULARY Place 1 spray into the nose 2 (two) times daily as needed. Compounded Lidocaine Spray.  Use as directed for headaches. ([facility])    Historical Provider, MD  ondansetron (ZOFRAN) 4 MG tablet Take 1 tablet (4 mg total) by mouth every 8 (eight) hours as needed for nausea. 03/29/13   Jerald Kief, MD  pantoprazole (PROTONIX) 40 MG tablet Take 40 mg by mouth daily.    Historical Provider, MD  pravastatin (PRAVACHOL) 20 MG tablet Take 20 mg by mouth daily.    Historical Provider, MD  Probiotic Product (PROBIOTIC DAILY PO) Take 1 capsule by mouth daily.    Historical Provider, MD  topiramate (TOPAMAX) 50 MG tablet TAKE ONE TABLET BY MOUTH TWICE DAILY 05/27/14   Ronal Fear, NP  traMADol (ULTRAM) 50 MG tablet Take 100 mg by mouth 2 (two) times daily as needed for moderate pain.     Historical Provider, MD   BP 132/87 mmHg  Pulse 75  Temp(Src) 97.8 F (36.6 C) (Oral)  Resp 18  Ht 5\' 5"  (1.651 m)  Wt 174 lb (78.926 kg)  BMI 28.96 kg/m2  SpO2 100% Physical Exam  Constitutional: She is oriented to person, place, and time. She appears well-developed and well-nourished. No  distress.  HENT:  Head: Normocephalic and atraumatic.  Right Ear: External ear normal.  Left Ear: External ear normal.  Nose: Nose normal.  Mouth/Throat: Oropharynx is clear and moist.  Eyes: EOM are normal. Pupils are equal, round, and reactive to light.  Neck: Normal range of motion. Neck supple.  Cardiovascular: Normal rate and regular rhythm.   Pulmonary/Chest: Effort normal.  Abdominal: Soft. Bowel sounds are normal. She exhibits no distension. There is no tenderness.  Musculoskeletal: Normal range of motion.  Neurological: She is alert and oriented to person, place, and time. She exhibits normal muscle tone. Coordination normal.  Skin: Skin is warm and dry.  Psychiatric: She has a normal mood and affect. Her behavior is normal. Thought content normal.  Nursing note and vitals reviewed.   ED Course  Procedures (including critical care time) DIAGNOSTIC STUDIES: Oxygen Saturation is 100% on RA, normal by my interpretation.    COORDINATION OF CARE: 12:33 PM- Pt advised of plan for treatment which includes medication and labs and pt agrees.  Labs Review Labs Reviewed  URINALYSIS, ROUTINE W REFLEX MICROSCOPIC  CBC WITH DIFFERENTIAL  COMPREHENSIVE METABOLIC PANEL  URINE MICROSCOPIC-ADD ON    Imaging Review No results found.   EKG Interpretation None      MDM   Final diagnoses:  Left flank pain  Hematuria  Recurrent kidney stones   Patient with history of multiple kidney stones presents today with left flank pain that is identical to previous kidney stone. She has hematuria here. She is treated here for pain and painful controlled. I have advised her to follow-up with her urologist this week.  , MD 09/20/14 1553  14/06/15, MD 10/13/14 [phone]

## 2014-09-20 NOTE — Discharge Instructions (Signed)

## 2014-09-21 LAB — URINE CULTURE: Colony Count: 15000

## 2014-11-20 ENCOUNTER — Other Ambulatory Visit: Payer: Self-pay | Admitting: Neurology

## 2015-01-18 ENCOUNTER — Other Ambulatory Visit: Payer: Self-pay | Admitting: Nurse Practitioner

## 2015-01-25 ENCOUNTER — Encounter: Payer: Self-pay | Admitting: Internal Medicine

## 2015-01-25 ENCOUNTER — Ambulatory Visit (INDEPENDENT_AMBULATORY_CARE_PROVIDER_SITE_OTHER): Payer: BC Managed Care – PPO | Admitting: Internal Medicine

## 2015-01-25 ENCOUNTER — Encounter (INDEPENDENT_AMBULATORY_CARE_PROVIDER_SITE_OTHER): Payer: Self-pay

## 2015-01-25 VITALS — BP 124/62 | HR 49 | Ht 65.0 in | Wt 190.0 lb

## 2015-01-25 DIAGNOSIS — IMO0001 Reserved for inherently not codable concepts without codable children: Secondary | ICD-10-CM

## 2015-01-25 DIAGNOSIS — J4520 Mild intermittent asthma, uncomplicated: Secondary | ICD-10-CM | POA: Diagnosis not present

## 2015-01-25 DIAGNOSIS — G4733 Obstructive sleep apnea (adult) (pediatric): Secondary | ICD-10-CM

## 2015-01-25 NOTE — Progress Notes (Signed)
02/06/14- 52 yoF former 2ppd/ 60 pk yr smoker seen for OSA, dyspnea, complicated by hx CVA Has been folllowed for OSA by Dr Shelle Iron, complicated by CVA/ patent foramen ovale, anxiety. PRESENTS FOR: Has seen [facility] for sleep, but asks I follow that problem along with her breathing issues..  Pt c/o increased SOB with exertion, has been worse since her stroke on 03/26/2013.   CPAP ?10/ [facility]. fullface mask is noisy. Has humidifier. Dyspnea since left body CVA in June of 2014. Dyspnea on exertion walking room to room. Denies cough or wheeze. Ventolin inhaler does not help. No leg weakness. She paces herself. Needing total knee replacement for arthritis which also limits activity. CXR 05/29/13 IMPRESSION:  No acute abnormalities seen.  Original Report Authenticated By: Alcide Clever, M.D. PFT 11/03/2013-mild obstructive airways disease with significant response to bronchodilator, normal lung volumes, mild diffusion defect. FVC 3.02/87%, FEV1 2.60/96%, FEV1/FVC 0.86, FEF 25-75% 4.50/174%, TLC 98%, DLCO 69%.  03/30/14- 53 yoF former 2ppd/ 60 pk yr smoker seen for OSA, dyspnea, complicated by hx CVA Has been folllowed for OSA by Dr Shelle Iron, complicated by CVA/ patent foramen ovale, anxiety. Husband here FOLLOWS FOR: Pt states the Anoro is not helping, c/o an increase in cough. Pt states she is wearing CPAP nightly (hasnt used CPAP in one week d/t dental work), wearing CPAP about 10 hours per night. Pt denies issues with mask, pressure and machine.  Anoro made her cough more- dry or clear mucus  07/24/14- 53 yoF former 2ppd/ 60 pk yr smoker seen for OSA, dyspnea, complicated by hx CVA Had been folllowed for OSA by Dr Shelle Iron, complicated by CVA/ patent foramen ovale, anxiety FOLLOWS FOR: Wears CPAP 10/ [facility] every night for about 8-16 hours; pressure works well for patient. DME is [facility].  Download pending    Husband here Tonsillectomy 04/01/14/ Dr Suszanne Conners Being followed by neurology for ?  seizures Breathing still easy DOE. Occ tight or wheeze. Uses rescue inhaler 1x/ week- dislikes stimulation.  01/25/15- 53 yoF former 2ppd/ 60 pkyr smoker seen for OSA, dyspnea/ moderate asthma, complicated by hx CVA FOLLOWS FOR: Wears CPAP10/ [facility] every night-about 8 hours; pressure may need to be lowered-air coming out the sides of the mask. DME is [facility]. Noticing spring pollen. Only needing rescue inhaler about one time a week with some dyspnea on exertion. Anoro made cough worse. Chronic bradycardia followed by Dr. Jacinto Halim. CXR 03/20/14 IMPRESSION: New left lower lobe atelectasis versus infiltrate. Recommend chest radiographic followup in several weeks to confirm resolution. Electronically Signed  By: Myles Rosenthal M.D.  On: 03/20/2014 15:05 PFT reviewed again from January.  ROS-see HPI Constitutional:   No-   weight loss, night sweats, fevers, chills, fatigue, lassitude. HEENT:   + headaches, No-difficulty swallowing, tooth/dental problems, sore throat,       No-  sneezing, itching, ear ache, nasal congestion, post nasal drip,  CV:  No-   chest pain, orthopnea, PND, swelling in lower extremities, anasarca,  dizziness, palpitations Resp: +shortness of breath with exertion or at rest.              No-   productive cough,  No non-productive cough,  No- coughing up of blood.              No-   change in color of mucus.  +wheezing.   Skin: No-   rash or lesions. GI:  No-   heartburn, indigestion, abdominal pain, nausea, vomiting, GU: . MS:  +joint  pain or swelling.  . Neuro-     ?seizures Psych:  No- change in mood or affect. No depression or anxiety.  No memory loss.  OBJ- Physical Exam General- Alert, Oriented, Affect-appropriate, Distress- none acute Skin- rash-none, lesions- none, excoriation- none Lymphadenopathy- none Head- atraumatic            Eyes- Gross vision intact, PERRLA, conjunctivae and secretions clear            Ears- gross hearing normal             Nose- Clear, no-Septal dev, mucus, polyps, erosion, perforation             Throat- Mallampati IV , mucosa clear , drainage- none, tonsils-absent,  Neck- flexible , trachea midline, no stridor , thyroid nl, carotid no bruit Chest - symmetrical excursion , unlabored           Heart/CV- RRR , no murmur , no gallop  , no rub, nl s1 s2                           - JVD- none , edema- none, stasis changes- none, varices- none           Lung- clear to P&A, wheeze- none, cough- none , dullness-none, rub- none           Chest wall-  Abd-  Br/ Gen/ Rectal- Not done, not indicated Extrem- cyanosis- none, clubbing, none, atrophy- none, strength- nl Neuro- grossly intact to observation

## 2015-01-25 NOTE — Patient Instructions (Addendum)
Order- DME [facility]     Change CPAP to 9 cwp    Dx OSA  Download for pressure compliance  Please call as needed

## 2015-02-14 NOTE — Assessment & Plan Note (Signed)
Good control, despite heavy smoking history, needing only occasional use of rescue inhaler. Does notice some dyspnea with exertion mostly hills and stairs.

## 2015-02-14 NOTE — Assessment & Plan Note (Signed)
Doing well with CPAP but we are went to try lowering pressure just a little to reduce leak. Plan- DME [facility] to reduce CPAP to 9

## 2015-02-23 ENCOUNTER — Ambulatory Visit (INDEPENDENT_AMBULATORY_CARE_PROVIDER_SITE_OTHER): Payer: Self-pay | Admitting: Neurology

## 2015-02-23 DIAGNOSIS — I6990 Unspecified sequelae of unspecified cerebrovascular disease: Secondary | ICD-10-CM

## 2015-02-23 NOTE — Progress Notes (Signed)
GORE PFO Closure Trial Study Visit She is seen today for the 18 month study follow-up visit. She states she is doing well without recurrent stroke or TIA symptoms. She remains on aspirin which is tolerating well without bleeding or bruising. She was seen in the ER in December for kidney stone which was treated conservatively with medical management and she is doing fine now. She has no new neurological complaints. She has mild short-term memory difficulties which are not new  Physical exam is unremarkable.   Neurological Exam ;  Awake  Alert oriented x 3. Normal speech and language.eye movements full without nystagmus.fundi were not visualized. Vision acuity and fields appear normal. Hearing is normal. Palatal movements are normal. Face symmetric. Tongue midline. Normal strength, tone, reflexes and coordination. Normal sensation. Gait deferred. NIH stroke scale is 0  Modified Rankin scale 1.  She was advised to continue aspirin and return for study follow-up in 6 months.  Delia Heady, MD

## 2015-06-02 ENCOUNTER — Other Ambulatory Visit: Payer: Self-pay | Admitting: Neurology

## 2015-07-24 ENCOUNTER — Other Ambulatory Visit: Payer: Self-pay | Admitting: Neurology

## 2015-07-27 ENCOUNTER — Ambulatory Visit: Payer: BC Managed Care – PPO | Admitting: Internal Medicine

## 2015-08-09 ENCOUNTER — Other Ambulatory Visit: Payer: Self-pay | Admitting: Neurology

## 2015-08-09 DIAGNOSIS — I6930 Unspecified sequelae of cerebral infarction: Secondary | ICD-10-CM

## 2015-08-24 ENCOUNTER — Ambulatory Visit (HOSPITAL_COMMUNITY)
Admission: RE | Admit: 2015-08-24 | Discharge: 2015-08-24 | Disposition: A | Discharge status: Home / Self Care | Payer: BC Managed Care – PPO | Source: Ambulatory Visit | Attending: Neurology | Admitting: Neurology

## 2015-08-24 DIAGNOSIS — I6930 Unspecified sequelae of cerebral infarction: Secondary | ICD-10-CM | POA: Insufficient documentation

## 2015-08-25 ENCOUNTER — Other Ambulatory Visit: Payer: Self-pay | Admitting: Neurology

## 2015-08-26 ENCOUNTER — Ambulatory Visit (INDEPENDENT_AMBULATORY_CARE_PROVIDER_SITE_OTHER): Payer: Self-pay | Admitting: Neurology

## 2015-08-26 DIAGNOSIS — G40209 Localization-related (focal) (partial) symptomatic epilepsy and epileptic syndromes with complex partial seizures, not intractable, without status epilepticus: Secondary | ICD-10-CM

## 2015-08-26 DIAGNOSIS — Z0289 Encounter for other administrative examinations: Secondary | ICD-10-CM

## 2015-08-30 NOTE — Progress Notes (Signed)
GORE PFO STUDY VISIT She is seen today for the 24 month study follow-up visit. She states she is doing well without recurrent stroke or TIA symptoms. She remains on aspirin which is tolerating well without bleeding or bruising. She was seen in the ER in December 2015 for kidney stone which was treated conservatively with medical management and she is doing fine now. She has no new neurological complaints. She has mild short-term memory difficulties which are not new  Physical exam is unremarkable.  Neurological Exam ;  Awake Alert oriented x 3. Normal speech and language.eye movements full without nystagmus.fundi were not visualized. Vision acuity and fields appear normal. Hearing is normal. Palatal movements are normal. Face symmetric. Tongue midline. Normal strength, tone, reflexes and coordination. Normal sensation. Gait deferred. NIH stroke scale is 0  Modified Rankin scale 1.  She was advised to continue aspirin and return for study follow-up in 6 months.  Delia Heady, MD

## 2015-09-17 ENCOUNTER — Ambulatory Visit (INDEPENDENT_AMBULATORY_CARE_PROVIDER_SITE_OTHER): Payer: BC Managed Care – PPO | Admitting: Diagnostic Neuroimaging

## 2015-09-17 DIAGNOSIS — G40209 Localization-related (focal) (partial) symptomatic epilepsy and epileptic syndromes with complex partial seizures, not intractable, without status epilepticus: Secondary | ICD-10-CM | POA: Diagnosis not present

## 2015-09-20 ENCOUNTER — Other Ambulatory Visit: Payer: Self-pay | Admitting: Neurology

## 2015-09-22 ENCOUNTER — Telehealth: Payer: Self-pay | Admitting: Internal Medicine

## 2015-09-22 NOTE — Telephone Encounter (Signed)
Spoke with Cordelia Pen at [facility], states that they have been waiting on surgical clearance for pt since 08/05/2015.  Paperwork is being refaxed to our office to [facility] attention.  Will forward to Katie to look out for.

## 2015-09-23 NOTE — Telephone Encounter (Signed)
Forms with phone note placed on CY's cart to advise if patient needs to be seen.    Pt last seen 01-25-15.

## 2015-09-23 NOTE — Telephone Encounter (Signed)
Clearance form completed 

## 2015-09-24 NOTE — Telephone Encounter (Signed)
Called and left detailed message for Sherri at [facility] to make aware forms faxed back - advised to let us know if not received.

## 2015-09-24 NOTE — Telephone Encounter (Signed)
Forms faxed back this afternoon to [employer]. Thanks.

## 2015-09-24 NOTE — Telephone Encounter (Signed)
[name], have forms been faxed back to [employer]? Please advise.

## 2015-09-27 NOTE — Procedures (Signed)
   [facility]  EEG (ELECTROENCEPHALOGRAM) REPORT   STUDY DATE: 09/17/15 PATIENT NAME: [name] DOB: [birth year] MRN: [id number]  ORDERING CLINICIAN: Delia Heady, MD   TECHNOLOGIST: Gearldine Shown  TECHNIQUE: Electroencephalogram was recorded utilizing standard 10-20 system of lead placement and reformatted into average and bipolar montages.  RECORDING TIME: 29 minutes ACTIVATION: hyperventilation and photic stimulation  CLINICAL INFORMATION: 54 year old female with post-stroke seizures.  FINDINGS: Background rhythms of 8-10 hertz and 50-60 microvolts. No focal, lateralizing, epileptiform activity or seizures are seen. Patient recorded in the awake state.  IMPRESSION:  Normal EEG in the awake state.    INTERPRETING PHYSICIAN:  Suanne Marker, MD Certified in Neurology, Neurophysiology and Neuroimaging  [facility] [address] [place], [place] [address] [phone]

## 2015-09-27 NOTE — Telephone Encounter (Signed)
Called and Spoke with Sherri - surgical clearance was received. Nothing further needed.

## 2015-09-28 ENCOUNTER — Telehealth: Payer: Self-pay

## 2015-09-28 NOTE — Telephone Encounter (Signed)
Lft vm for patient to call about EEG results.

## 2015-09-28 NOTE — Telephone Encounter (Signed)
-----   Message from Pramod S Sethi, MD sent at 09/27/2015  6:02 PM EST ----- Kindly inform the patient had EEG study was normal 

## 2015-09-29 NOTE — Telephone Encounter (Signed)
-----   Message from Pramod S Sethi, MD sent at 09/27/2015  6:02 PM EST ----- Kindly inform the patient had EEG study was normal 

## 2015-09-29 NOTE — Telephone Encounter (Signed)
LFt vm for patient to call back about EEG results. 

## 2015-09-30 ENCOUNTER — Other Ambulatory Visit: Payer: BC Managed Care – PPO

## 2015-10-05 ENCOUNTER — Ambulatory Visit (INDEPENDENT_AMBULATORY_CARE_PROVIDER_SITE_OTHER): Payer: BC Managed Care – PPO | Admitting: Internal Medicine

## 2015-10-05 ENCOUNTER — Ambulatory Visit (INDEPENDENT_AMBULATORY_CARE_PROVIDER_SITE_OTHER)
Admission: RE | Admit: 2015-10-05 | Discharge: 2015-10-05 | Disposition: A | Discharge status: Home / Self Care | Payer: BC Managed Care – PPO | Source: Ambulatory Visit | Attending: Internal Medicine | Admitting: Internal Medicine

## 2015-10-05 ENCOUNTER — Encounter: Payer: Self-pay | Admitting: Internal Medicine

## 2015-10-05 VITALS — BP 92/68 | HR 76 | Ht 65.0 in | Wt 184.2 lb

## 2015-10-05 DIAGNOSIS — Z87891 Personal history of nicotine dependence: Secondary | ICD-10-CM

## 2015-10-05 DIAGNOSIS — G4733 Obstructive sleep apnea (adult) (pediatric): Secondary | ICD-10-CM | POA: Diagnosis not present

## 2015-10-05 DIAGNOSIS — Z23 Encounter for immunization: Secondary | ICD-10-CM | POA: Diagnosis not present

## 2015-10-05 DIAGNOSIS — J4520 Mild intermittent asthma, uncomplicated: Secondary | ICD-10-CM

## 2015-10-05 DIAGNOSIS — IMO0001 Reserved for inherently not codable concepts without codable children: Secondary | ICD-10-CM

## 2015-10-05 NOTE — Assessment & Plan Note (Signed)
Has been mild intermittent asthma with infrequent need for Ventolin rescue inhaler and little sleep disturbance from respiratory discomfort. Plan-flu vaccine, chest x-ray

## 2015-10-05 NOTE — Assessment & Plan Note (Signed)
Describes good compliance and control with CPAP 10

## 2015-10-05 NOTE — Patient Instructions (Signed)
Ok to call for Ventolin/ albuterol HFA inhaler refill  Consider trying a room humidifier or otc saline nasal gel as needed for dry nose so you can get back to using the CPAP.  Flu vax  Order- CXR  Hx tobacco use

## 2015-10-05 NOTE — Progress Notes (Signed)
02/06/14- 52 yoF former 2ppd/ 60 pk yr smoker seen for OSA, dyspnea, complicated by hx CVA Has been folllowed for OSA by Dr Shelle Iron, complicated by CVA/ patent foramen ovale, anxiety. PRESENTS FOR: Has seen [facility] for sleep, but asks I follow that problem along with her breathing issues..  Pt c/o increased SOB with exertion, has been worse since her stroke on 03/26/2013.   CPAP ?10/ [facility]. fullface mask is noisy. Has humidifier. Dyspnea since left body CVA in June of 2014. Dyspnea on exertion walking room to room. Denies cough or wheeze. Ventolin inhaler does not help. No leg weakness. She paces herself. Needing total knee replacement for arthritis which also limits activity. CXR 05/29/13 IMPRESSION:  No acute abnormalities seen.  Original Report Authenticated By: Alcide Clever, M.D. PFT 11/03/2013-mild obstructive airways disease with significant response to bronchodilator, normal lung volumes, mild diffusion defect. FVC 3.02/87%, FEV1 2.60/96%, FEV1/FVC 0.86, FEF 25-75% 4.50/174%, TLC 98%, DLCO 69%.  03/30/14- 53 yoF former 2ppd/ 60 pk yr smoker seen for OSA, dyspnea, complicated by hx CVA Has been folllowed for OSA by Dr Shelle Iron, complicated by CVA/ patent foramen ovale, anxiety. Husband here FOLLOWS FOR: Pt states the Anoro is not helping, c/o an increase in cough. Pt states she is wearing CPAP nightly (hasnt used CPAP in one week d/t dental work), wearing CPAP about 10 hours per night. Pt denies issues with mask, pressure and machine.  Anoro made her cough more- dry or clear mucus  07/24/14- 53 yoF former 2ppd/ 60 pk yr smoker seen for OSA, dyspnea, complicated by hx CVA Had been folllowed for OSA by Dr Shelle Iron, complicated by CVA/ patent foramen ovale, anxiety FOLLOWS FOR: Wears CPAP 10/ [facility] every night for about 8-16 hours; pressure works well for patient. DME is [facility].  Download pending    Husband here Tonsillectomy 04/01/14/ Dr Suszanne Conners Being followed by neurology for ?  seizures Breathing still easy DOE. Occ tight or wheeze. Uses rescue inhaler 1x/ week- dislikes stimulation.  01/25/15- 53 yoF former 2ppd/ 60 pkyr smoker seen for OSA, dyspnea/ moderate asthma, complicated by hx CVA FOLLOWS FOR: Wears CPAP10/ [facility] every night-about 8 hours; pressure may need to be lowered-air coming out the sides of the mask. DME is [facility]. Noticing spring pollen. Only needing rescue inhaler about one time a week with some dyspnea on exertion. Anoro made cough worse. Chronic bradycardia followed by Dr. Jacinto Halim. CXR 03/20/14 IMPRESSION: New left lower lobe atelectasis versus infiltrate. Recommend chest radiographic followup in several weeks to confirm resolution. Electronically Signed  By: Myles Rosenthal M.D.  On: 03/20/2014 15:05 PFT reviewed again from January.  10/05/2015-54 year old female former 2 pack per day/60 pack year smoker seen for OSA, dyspnea/moderate asthma, complicated by history CVA CPAP 10/[facility] FOLLOWS FOR:  Pt reports that she has not worn CPAP this week d/t it feeling very drying - humidity level set on 86. Works well when able to wear it, sleeps through the night. Pt wants flu vaccine today - scheduled for knee replacement 11/01/15. Pending right TKR-discussed use of CPAP in hospital. Husband says she does not usually snore through CPAP and she does feel she sleeps better when she uses it. No routine cough or wheeze. Activity is limited by her arthritis pain  ROS-see HPI Constitutional:   No-   weight loss, night sweats, fevers, chills, fatigue, lassitude. HEENT:   + headaches, No-difficulty swallowing, tooth/dental problems, sore throat,       No-  sneezing, itching,  ear ache, nasal congestion, post nasal drip,  CV:  No-   chest pain, orthopnea, PND, swelling in lower extremities, anasarca,  dizziness, palpitations Resp: +shortness of breath with exertion or at rest.              No-   productive cough,  No non-productive cough,  No-  coughing up of blood.              No-   change in color of mucus.  +wheezing.   Skin: No-   rash or lesions. GI:  No-   heartburn, indigestion, abdominal pain, nausea, vomiting, GU: . MS:  +joint pain or swelling.  . Neuro-     ?seizures Psych:  No- change in mood or affect. No depression or anxiety.  No memory loss.  OBJ- Physical Exam General- Alert, Oriented, Affect-appropriate, Distress- none acute Skin- rash-none, lesions- none, excoriation- none Lymphadenopathy- none Head- atraumatic            Eyes- Gross vision intact, PERRLA, conjunctivae and secretions clear            Ears- gross hearing normal            Nose- Clear, no-Septal dev, mucus, polyps, erosion, perforation             Throat- Mallampati IV , mucosa clear , drainage- none, tonsils-absent,  Neck- flexible , trachea midline, no stridor , thyroid nl, carotid no bruit Chest - symmetrical excursion , unlabored           Heart/CV- RRR , no murmur , no gallop  , no rub, nl s1 s2                           - JVD- none , edema- none, stasis changes- none, varices- none           Lung- clear to P&A, wheeze- none, cough- none , dullness-none, rub- none           Chest wall-  Abd-  Br/ Gen/ Rectal- Not done, not indicated Extrem- cyanosis- none, clubbing, none, atrophy- none, strength- nl Neuro- grossly intact to observation

## 2015-10-06 ENCOUNTER — Telehealth: Payer: Self-pay | Admitting: Internal Medicine

## 2015-10-06 NOTE — Telephone Encounter (Signed)
Result Notes     Notes Recorded by Vito Backers, CMA on 10/06/2015 at 9:46 AM Attempted to contact patient regarding results. Left message on voicemail for patient to return call. ------  Notes Recorded by Waymon Budge, MD on 10/05/2015 at 2:59 PM CXR- lungs are clear   lmtcb x1 for pt.

## 2015-10-06 NOTE — Telephone Encounter (Signed)
Rn gave patient results that EEG was normal. Pt verbalized understanding of results.

## 2015-10-06 NOTE — Telephone Encounter (Signed)
Spoke with pt. She is aware of her results. Nothing further was needed. 

## 2015-10-06 NOTE — Telephone Encounter (Signed)
Pt called to get EEG results. May call back

## 2015-10-06 NOTE — Telephone Encounter (Signed)
Pt cb [phone]

## 2015-10-06 NOTE — Telephone Encounter (Signed)
-----   Message from Micki Riley, MD sent at 09/27/2015  6:02 PM EST ----- Kindly inform the patient had EEG study was normal

## 2015-10-15 DIAGNOSIS — G4730 Sleep apnea, unspecified: Secondary | ICD-10-CM | POA: Diagnosis present

## 2015-10-15 DIAGNOSIS — M797 Fibromyalgia: Secondary | ICD-10-CM | POA: Diagnosis present

## 2015-10-15 DIAGNOSIS — Z8619 Personal history of other infectious and parasitic diseases: Secondary | ICD-10-CM | POA: Diagnosis present

## 2015-10-15 DIAGNOSIS — R569 Unspecified convulsions: Secondary | ICD-10-CM

## 2015-10-15 DIAGNOSIS — Z9889 Other specified postprocedural states: Secondary | ICD-10-CM

## 2015-10-15 DIAGNOSIS — M069 Rheumatoid arthritis, unspecified: Secondary | ICD-10-CM | POA: Diagnosis present

## 2015-10-15 DIAGNOSIS — Q210 Ventricular septal defect: Secondary | ICD-10-CM

## 2015-10-15 DIAGNOSIS — R112 Nausea with vomiting, unspecified: Secondary | ICD-10-CM | POA: Diagnosis present

## 2015-10-15 NOTE — H&P (Signed)
TOTAL KNEE ADMISSION H&P  Patient is being admitted for right total knee arthroplasty.  Subjective:  Chief Complaint:right knee pain.  HPI: [name], 54 y.o. female, has a history of pain and functional disability in the right knee due to arthritis and has failed non-surgical conservative treatments for greater than 12 weeks to includeNSAID's and/or analgesics, corticosteriod injections, viscosupplementation injections, flexibility and strengthening excercises, supervised PT with diminished ADL's post treatment, use of assistive devices, weight reduction as appropriate and activity modification.  Onset of symptoms was gradual, starting 10 years ago with gradually worsening course since that time. The patient noted no past surgery on the right knee(s).  Patient currently rates pain in the right knee(s) at 10 out of 10 with activity. Patient has night pain, worsening of pain with activity and weight bearing, pain that interferes with activities of daily living, crepitus and joint swelling.  Patient has evidence of subchondral sclerosis, periarticular osteophytes and joint space narrowing by imaging studies.  There is no active infection.  Patient Active Problem List   Diagnosis Date Noted  . Rheumatoid arthritis (HCC)   . PONV (postoperative nausea and vomiting)   . Seizures (HCC)   . Fibromyalgia   . Sleep apnea   . Ventricular septal defect   . History of staph infection   . Refractory migraine 08/10/2014  . Complex partial epilepsy (HCC) 08/10/2014  . S/P tonsillectomy and adenoidectomy 04/01/2014  . Panic anxiety syndrome 06/03/2013  . Positive depression screening 06/03/2013  . Headache(784.0) 06/03/2013  . Unspecified cerebral artery occlusion with cerebral infarction 06/03/2013  . Headache 05/29/2013  . Patent foramen ovale 05/29/2013  . Chest pain 05/29/2013  . Other convulsions 05/29/2013  . Dysphagia, unspecified(787.20) 04/17/2013  . Acute bronchitis 03/27/2013  .  Persistent headaches 03/26/2013  . CVA (cerebral infarction) history 03/26/2013  . Hypokalemia 03/26/2013  . Leukocytosis 03/26/2013  . Arthritis 03/26/2013  . QT prolongation 03/26/2013  . Stroke (HCC) 03/26/2013  . Recurrent ventral incisional hernia 04/19/2012  . Obstructive sleep apnea 08/22/2010  . RESTLESS LEGS SYNDROME 08/22/2010  . Moderate intermittent asthma without complication 08/22/2010  . ECZEMA 08/22/2010  . Irritable bowel syndrome 06/23/2010  . DIARRHEA 06/23/2010  . WEIGHT LOSS 11/01/2009  . NAUSEA WITH VOMITING 11/01/2009  . ABDOMINAL PAIN, LOWER 11/01/2009  . KNEE, ARTHRITIS, DEGEN./OSTEO 11/18/2008  . DERANGEMENT MENISCUS 11/02/2008  . JOINT EFFUSION, KNEE 11/02/2008  . KNEE PAIN 11/02/2008   Past Medical History  Diagnosis Date  . IBS (irritable bowel syndrome)     mixed  . Allergic rhinitis     uses Flonase daily as needed and takes CLaritin daily  . Eczema   . Plaque psoriasis   . Cerebral vascular malformation     sees dr Lovell Sheehan for monitoring as needed, sees dr lewitt for headaches every 4 months  . Lactose intolerance   . Anxiety     takes Xanax daily as needed  . Asthma     Albuterol inhaler prn;SYmbicort daily  . Depression     takes Citalopram daily  . Rheumatoid arthritis(714.0) 2010    oa and ra;Rhemicade IV every 6wks and Metotrexate weekly  . Nausea     takes Zofran daily as needed  . GERD (gastroesophageal reflux disease)     takes Protonix daily  . Hyperlipidemia     takes Pravastatin daily  . PONV (postoperative nausea and vomiting)   . Shortness of breath     with exertion  . Pneumonia 15yrs ago  hx of  . History of bronchitis as a child   . History of migraine     last one 10+yrs ago  . Seizures (HCC) 6months ago    takes Depakote daily  . Stroke (HCC) 03/26/2013    left sided weakness  . Joint pain   . Joint swelling   . Fibromyalgia   . Diverticulitis at age 25  . History of kidney stones   . Thyroid cyst   .  Insomnia   . Sleep apnea     study done >31yrs ago;uses CPAP nightly  . History of staph infection 76yrs ago  . Ventricular septal defect     Past Surgical History  Procedure Laterality Date  . Incontinence surgery  2010    sling done   . Knee arthroscopy  1992    left  . Foot surgery  1999    right ankle  . Kidney stone surgery  2001  . Appendectomy  1981  . Cholecystectomy  1981  . Tubal ligation  1990  . Total abdominal hysterectomy  2001  . Hernia repair  2006  . Left foot plating and scarping for arthritis  2011  . Right ear tube insertion  2011  . Incisional hernia repair  05/20/2012    Procedure: HERNIA REPAIR INCISIONAL;  Surgeon: Clovis Pu. Cornett, MD;  Location: [facility] ORS;  Service: General;  Laterality: N/A;  . Tee without cardioversion N/A 05/15/2013    Procedure: TRANSESOPHAGEAL ECHOCARDIOGRAM (TEE);  Surgeon: Pamella Pert, MD;  Location: [facility] ENDOSCOPY;  Service: Cardiovascular;  Laterality: N/A;  . Cardiac catheterization  2004  . Colonoscopy    . Thryoid biopsy    . Tonsillectomy and adenoidectomy  04/01/2014  . Tonsillectomy and adenoidectomy Bilateral 04/01/2014    Procedure: BILATERAL TONSILLECTOMY AND ADENOIDECTOMY;  Surgeon: Darletta Moll, MD;  Location: [facility] OR;  Service: ENT;  Laterality: Bilateral;    No prescriptions prior to admission   No current facility-administered medications for this encounter.  Current outpatient prescriptions:  .  Abatacept (ORENCIA IV), Inject 75 mg into the vein every 30 (thirty) days., Disp: , Rfl:  .  albuterol (PROVENTIL HFA;VENTOLIN HFA) 108 (90 BASE) MCG/ACT inhaler, Inhale 2 puffs into the lungs every 6 (six) hours as needed for wheezing or shortness of breath., Disp: , Rfl:  .  ALPRAZolam (XANAX) 0.5 MG tablet, Take 0.5 mg by mouth 2 (two) times daily as needed for anxiety. , Disp: , Rfl:  .  aspirin 81 MG tablet, Take 81 mg by mouth daily., Disp: , Rfl:  .  citalopram (CELEXA) 20 MG tablet, TAKE TWO TABLETS BY MOUTH DAILY,  Disp: 60 tablet, Rfl: 6 .  clobetasol ointment (TEMOVATE) 0.05 %, Apply 1 application topically 2 (two) times daily as needed (inflammation on feet.). , Disp: , Rfl:  .  cycloSPORINE (RESTASIS) 0.05 % ophthalmic emulsion, Place 4 drops into both eyes 2 (two) times daily., Disp: , Rfl:  .  divalproex (DEPAKOTE ER) 500 MG 24 hr tablet, TAKE TWO TABLETS BY MOUTH TWICE DAILY, Disp: 120 tablet, Rfl: 3 .  fluticasone (FLONASE) 50 MCG/ACT nasal spray, Place 2 sprays into both nostrils daily as needed for allergies. , Disp: , Rfl:  .  folic acid (FOLVITE) 1 MG tablet, Take 1 mg by mouth daily., Disp: , Rfl:  .  HYDROcodone-acetaminophen (NORCO/VICODIN) 5-325 MG per tablet, Take 1 tablet by mouth every 6 (six) hours as needed for moderate pain., Disp: , Rfl:  .  InFLIXimab (REMICADE  IV), Inject 3 mg/kg into the vein every 6 (six) weeks. , Disp: , Rfl:  .  loratadine (CLARITIN) 10 MG tablet, Take 10 mg by mouth daily., Disp: , Rfl:  .  Methotrexate, PF, 10 MG/0.4ML SOAJ, Inject 10 mg into the skin once a week. Friday, Disp: , Rfl:  .  Multiple Vitamin (MULTIVITAMIN) capsule, Take 1 capsule by mouth daily. , Disp: , Rfl:  .  NON FORMULARY, Place 1 spray into the nose 2 (two) times daily as needed. Compounded Lidocaine Spray.  Use as directed for headaches. [facility]), Disp: , Rfl:  .  ondansetron (ZOFRAN) 4 MG tablet, Take 1 tablet (4 mg total) by mouth every 8 (eight) hours as needed for nausea., Disp: 20 tablet, Rfl: 0 .  oxyCODONE-acetaminophen (PERCOCET/ROXICET) 5-325 MG per tablet, Take 2 tablets by mouth every 4 (four) hours as needed for severe pain., Disp: 15 tablet, Rfl: 0 .  pantoprazole (PROTONIX) 40 MG tablet, Take 40 mg by mouth daily., Disp: , Rfl:  .  pravastatin (PRAVACHOL) 20 MG tablet, Take 20 mg by mouth daily., Disp: , Rfl:  .  Probiotic Product (PROBIOTIC DAILY PO), Take 1 capsule by mouth daily., Disp: , Rfl:  .  topiramate (TOPAMAX) 50 MG tablet, TAKE ONE TABLET BY MOUTH  TWICE DAILY, Disp: 180 tablet, Rfl: 1 .  traMADol (ULTRAM) 50 MG tablet, Take 100 mg by mouth 2 (two) times daily as needed for moderate pain. , Disp: , Rfl:  Allergies  Allergen Reactions  . Amoxicillin     REACTION: GI upset  . Aspirin     REACTION: severe gi upset.  Pt states she can tolerate 81 mg asa only.   . Ciprofloxacin     REACTION: angioedema, urticaria  . Cosyntropin     REACTION: swelling, hives  . Guaifenesin     REACTION: head rash  . Moxifloxacin Other (See Comments)    gi upset  . Nsaids Other (See Comments)    Gi upset  . Sulfonamide Derivatives     REACTION: swelling, tongue and hives    Social History  Substance Use Topics  . Smoking status: Former Smoker -- 2.00 packs/day for 30 years    Types: Cigarettes  . Smokeless tobacco: Never Used     Comment: quit smoking in 2002  . Alcohol Use: No    Family History  Problem Relation Age of Onset  . Diabetes Maternal Grandmother   . Arthritis Maternal Grandmother   . Diabetes Brother   . Diabetes Paternal Grandmother   . Diabetes Maternal Grandfather   . Diabetes Paternal Grandfather   . Heart disease Brother   . Heart disease Other     Uncle  . Breast cancer Maternal Aunt   . Ovarian cancer Mother      Review of Systems  Constitutional: Negative.   HENT: Negative.   Eyes: Negative.   Respiratory: Positive for shortness of breath. Negative for cough, hemoptysis, sputum production and wheezing.   Cardiovascular: Positive for chest pain. Negative for palpitations, orthopnea, claudication, leg swelling and PND.  Gastrointestinal: Negative.   Genitourinary: Negative.   Musculoskeletal: Positive for back pain and joint pain.  Skin: Negative.   Neurological: Negative.   Endo/Heme/Allergies: Negative.   Psychiatric/Behavioral: Negative.     Objective:  Physical Exam  Constitutional: She is oriented to person, place, and time. She appears well-developed and well-nourished.  HENT:  Head:  Normocephalic and atraumatic.  Mouth/Throat: Oropharynx is clear and moist.  Eyes: Conjunctivae  are normal. Pupils are equal, round, and reactive to light.  Neck: Normal range of motion. Neck supple.  Cardiovascular: Normal rate.   Respiratory: Effort normal and breath sounds normal.  GI: Bowel sounds are normal.  Genitourinary:  Not pertinent to current symptomatology therefore not examined.  Musculoskeletal:  Examination of her right knee reveals pain medially and laterally.  Mild varus deformity.  1+ effusion.  Range of motion 0-120 degrees.  Knee is stable with normal patella tracking.  Examination of her left knee reveals pain medially and laterally.  Mild valgus deformity.  1+ effusion.  Range of motion 0-120 degrees.  Knee is stable with normal patella tracking.  Vascular exam: Pulses are 2+ and symmetric.    Neurological: She is alert and oriented to person, place, and time.  Skin: Skin is warm and dry.  Psychiatric: She has a normal mood and affect.    Vital signs in last 24 hours:    Labs:   Estimated body mass index is 31.62 kg/(m^2) as calculated from the following:   Height as of 01/25/15: 5\' 5"  (1.651 m).   Weight as of 01/25/15: 86.183 kg (190 lb).   Imaging Review Plain radiographs demonstrate severe degenerative joint disease of the right knee(s). The overall alignment issignificant varus. The bone quality appears to be good for age and reported activity level.  Assessment/Plan:  End stage arthritis, right knee   The patient history, physical examination, clinical judgment of the provider and imaging studies are consistent with end stage degenerative joint disease of the right knee(s) and total knee arthroplasty is deemed medically necessary. The treatment options including medical management, injection therapy arthroscopy and arthroplasty were discussed at length. The risks and benefits of total knee arthroplasty were presented and reviewed. The risks due to aseptic  loosening, infection, stiffness, patella tracking problems, thromboembolic complications and other imponderables were discussed. The patient acknowledged the explanation, agreed to proceed with the plan and consent was signed. Patient is being admitted for inpatient treatment for surgery, pain control, PT, OT, prophylactic antibiotics, VTE prophylaxis, progressive ambulation and ADL's and discharge planning. The patient is planning to be discharged home with home health services.  I had a long discussion with both the patient and her husband about that she has been cleared for this surgery by multiple physicians but she is at high risk for complications including infection because of her rheumatoid medications, stroke because of her previous stroke and VSD, and possibly death.  She and her understand these risks.  Post operatively we will use Eliquis for DVT and stroke  Prophylaxis.

## 2015-10-20 ENCOUNTER — Encounter (HOSPITAL_COMMUNITY)
Admission: RE | Admit: 2015-10-20 | Discharge: 2015-10-20 | Disposition: A | Discharge status: Home / Self Care | Payer: PPO | Source: Ambulatory Visit | Attending: Orthopedic Surgery | Admitting: Orthopedic Surgery

## 2015-10-20 ENCOUNTER — Encounter (HOSPITAL_COMMUNITY): Payer: Self-pay

## 2015-10-20 DIAGNOSIS — E785 Hyperlipidemia, unspecified: Secondary | ICD-10-CM | POA: Insufficient documentation

## 2015-10-20 DIAGNOSIS — M797 Fibromyalgia: Secondary | ICD-10-CM | POA: Diagnosis not present

## 2015-10-20 DIAGNOSIS — F419 Anxiety disorder, unspecified: Secondary | ICD-10-CM | POA: Insufficient documentation

## 2015-10-20 DIAGNOSIS — J45909 Unspecified asthma, uncomplicated: Secondary | ICD-10-CM | POA: Diagnosis not present

## 2015-10-20 DIAGNOSIS — Z01818 Encounter for other preprocedural examination: Secondary | ICD-10-CM | POA: Diagnosis not present

## 2015-10-20 DIAGNOSIS — M1711 Unilateral primary osteoarthritis, right knee: Secondary | ICD-10-CM | POA: Diagnosis not present

## 2015-10-20 DIAGNOSIS — Z7982 Long term (current) use of aspirin: Secondary | ICD-10-CM | POA: Insufficient documentation

## 2015-10-20 DIAGNOSIS — Z87891 Personal history of nicotine dependence: Secondary | ICD-10-CM | POA: Diagnosis not present

## 2015-10-20 DIAGNOSIS — F329 Major depressive disorder, single episode, unspecified: Secondary | ICD-10-CM | POA: Insufficient documentation

## 2015-10-20 DIAGNOSIS — L400 Psoriasis vulgaris: Secondary | ICD-10-CM | POA: Insufficient documentation

## 2015-10-20 DIAGNOSIS — Q211 Atrial septal defect: Secondary | ICD-10-CM | POA: Diagnosis not present

## 2015-10-20 DIAGNOSIS — Z0183 Encounter for blood typing: Secondary | ICD-10-CM | POA: Diagnosis not present

## 2015-10-20 DIAGNOSIS — Z8673 Personal history of transient ischemic attack (TIA), and cerebral infarction without residual deficits: Secondary | ICD-10-CM | POA: Insufficient documentation

## 2015-10-20 DIAGNOSIS — G4733 Obstructive sleep apnea (adult) (pediatric): Secondary | ICD-10-CM | POA: Insufficient documentation

## 2015-10-20 DIAGNOSIS — K219 Gastro-esophageal reflux disease without esophagitis: Secondary | ICD-10-CM | POA: Diagnosis not present

## 2015-10-20 DIAGNOSIS — R569 Unspecified convulsions: Secondary | ICD-10-CM | POA: Insufficient documentation

## 2015-10-20 DIAGNOSIS — M069 Rheumatoid arthritis, unspecified: Secondary | ICD-10-CM | POA: Diagnosis not present

## 2015-10-20 DIAGNOSIS — Z01812 Encounter for preprocedural laboratory examination: Secondary | ICD-10-CM | POA: Insufficient documentation

## 2015-10-20 DIAGNOSIS — Z79899 Other long term (current) drug therapy: Secondary | ICD-10-CM | POA: Insufficient documentation

## 2015-10-20 HISTORY — DX: Atrial septal defect: Q21.1

## 2015-10-20 HISTORY — DX: Patent foramen ovale: Q21.12

## 2015-10-20 LAB — COMPREHENSIVE METABOLIC PANEL
ALK PHOS: 86 U/L (ref 38–126)
ALT: 17 U/L (ref 14–54)
ANION GAP: 10 (ref 5–15)
AST: 20 U/L (ref 15–41)
Albumin: 3.4 g/dL — ABNORMAL LOW (ref 3.5–5.0)
BILIRUBIN TOTAL: 0.3 mg/dL (ref 0.3–1.2)
BUN: 7 mg/dL (ref 6–20)
CALCIUM: 9.2 mg/dL (ref 8.9–10.3)
CO2: 20 mmol/L — AB (ref 22–32)
Chloride: 110 mmol/L (ref 101–111)
Creatinine, Ser: 0.57 mg/dL (ref 0.44–1.00)
Glucose, Bld: 98 mg/dL (ref 65–99)
Potassium: 4 mmol/L (ref 3.5–5.1)
SODIUM: 140 mmol/L (ref 135–145)
TOTAL PROTEIN: 6.9 g/dL (ref 6.5–8.1)

## 2015-10-20 LAB — CBC WITH DIFFERENTIAL/PLATELET
Basophils Absolute: 0 10*3/uL (ref 0.0–0.1)
Basophils Relative: 0 %
EOS ABS: 0.1 10*3/uL (ref 0.0–0.7)
EOS PCT: 1 %
HCT: 43.4 % (ref 36.0–46.0)
HEMOGLOBIN: 14.3 g/dL (ref 12.0–15.0)
LYMPHS ABS: 2 10*3/uL (ref 0.7–4.0)
Lymphocytes Relative: 26 %
MCH: 34.2 pg — AB (ref 26.0–34.0)
MCHC: 32.9 g/dL (ref 30.0–36.0)
MCV: 103.8 fL — AB (ref 78.0–100.0)
MONOS PCT: 9 %
Monocytes Absolute: 0.7 10*3/uL (ref 0.1–1.0)
NEUTROS PCT: 64 %
Neutro Abs: 5.1 10*3/uL (ref 1.7–7.7)
Platelets: 201 10*3/uL (ref 150–400)
RBC: 4.18 MIL/uL (ref 3.87–5.11)
RDW: 12.8 % (ref 11.5–15.5)
WBC: 7.9 10*3/uL (ref 4.0–10.5)

## 2015-10-20 LAB — ABO/RH: ABO/RH(D): O NEG

## 2015-10-20 LAB — SURGICAL PCR SCREEN
MRSA, PCR: NEGATIVE
Staphylococcus aureus: NEGATIVE

## 2015-10-20 LAB — TYPE AND SCREEN
ABO/RH(D): O NEG
Antibody Screen: NEGATIVE

## 2015-10-20 LAB — PROTIME-INR
INR: 1.02 (ref 0.00–1.49)
PROTHROMBIN TIME: 13.6 s (ref 11.6–15.2)

## 2015-10-20 LAB — APTT: aPTT: 30 seconds (ref 24–37)

## 2015-10-20 NOTE — Pre-Procedure Instructions (Signed)
    Zyra A Gortney  10/20/2015      [facility] - [place], [place] - [address] [place] [address] Phone: [phone] Fax: [phone]  [facility] - [place], [place] - [address] [facility] [address] [place] [address] Phone: [phone] Fax: [phone]    Your procedure is scheduled on 1/16/.17.  Report to [facility] Admitting at 1030 A.M.  Call this number if you have problems the morning of surgery:  [phone]   Remember:  Do not eat food or drink liquids after midnight.  Take these medicines the morning of surgery with A SIP OF WATER --all inhalers,xanax,celexa,depakote,hydrocodone,protonix,topamax   Do not wear jewelry, make-up or nail polish.  Do not wear lotions, powders, or perfumes.  You may wear deodorant.  Do not shave 48 hours prior to surgery.  Men may shave face and neck.  Do not bring valuables to the hospital.  [facility] is not responsible for any belongings or valuables.  Contacts, dentures or bridgework may not be worn into surgery.  Leave your suitcase in the car.  After surgery it may be brought to your room.  For patients admitted to the hospital, discharge time will be determined by your treatment team.  Patients discharged the day of surgery will not be allowed to drive home.   Name and phone number of your driver:    Special instructions:   Please read over the following fact sheets that you were given. Pain Booklet, Coughing and Deep Breathing, Blood Transfusion Information, MRSA Information and Surgical Site Infection Prevention

## 2015-10-21 LAB — URINE CULTURE

## 2015-10-22 ENCOUNTER — Encounter (HOSPITAL_COMMUNITY): Payer: Self-pay

## 2015-10-22 ENCOUNTER — Encounter (HOSPITAL_COMMUNITY): Payer: Self-pay | Admitting: Certified Registered Nurse Anesthetist

## 2015-10-22 ENCOUNTER — Encounter (HOSPITAL_COMMUNITY): Payer: Self-pay | Admitting: Vascular Surgery

## 2015-10-22 NOTE — Progress Notes (Signed)
Anesthesia Chart Review: Patient is a 55 year old female scheduled for right TKR on 11/01/15 by Dr. Thurston Hole.   History includes former smoker, post-operative N/V, IBS, allergic rhinitis, eczema, asthma, plaque psoriasis, RA, cerebral vascular malformation (right frontal cavernoma; has seen neurosurgeon Dr. Lovell Sheehan), seizures '14, anxiety, depression, lactose intolerance, GERD, HLD, exertional dyspnea, right parietal ischemic CVA with left hemiparesis 03/2013, OSA with CPAP (Dr. Jetty Duhamel), fibromyalgia, migraines, thyroid cyst, cholecystectomy, T&A '15, incisional hernia repair '13. History lists VSD but TEE only mentions a large PFO (per cardiology Dr. Jacinto Halim records, patient is a part of a randomized to the medical therapy arm of the "REDUCE" secondary prevention trial of CVA versus PFO closure. BMI 30.54. PCP is Dr. Benedetto Goad.   - Pulmonologist Dr. Maple Hudson cleared her with recommendation to continue CPAP. He felt her moderate intermittent asthma was controlled.  - Cardiologist is Dr. Jacinto Halim with [facility]. At her 09/06/15 visit, he felt "From cardiac standpoint I do not see any contraindication for her to undergo right knee replacement."   Meds include Abatacept, albuterol, Xanax, ASA 81mg , Celexa, Depakote, Flonase, Folvite, Norco, Claritin, methotrexate, Zofran, Protonix, pravastatin, Topamax.  09/06/15 EKG [facility] CV): SB, diffuse non-specific T wave abnormality (most notable in inferior leads and anterolateral leads V3-6).   05/26/13 Nuclear stress test [facility]): Conclusions: Resting EKG NSR, poor r wave progression. Stess EKG non-diagnostic. Stress symptoms included stomach pain, lightheadedness, chest pain, headache, and nausea. Perfusion study demonstrated mild gut uptake artifact in both at rest and stress. No evidence of ischemia or scar. Dynamic gated images reveal normal wall motion and endocardial thickening. LVEF estimated 70%. Low risk study.  05/15/13 TEE: Study  Conclusions - Left ventricle: Systolic function was normal. Wall motion was normal; there were no regional wall motion abnormalities. - Left atrium: No evidence of thrombus in the atrial cavity or appendage. - Right atrium: No evidence of thrombus in the atrial cavity or appendage. - Atrial septum: There was a patent foramen ovale. Agitated saline contrast study showed a large right-to-left atrial level shunt, in the baseline state. Agitated saline contrast study showed a large right-to-left atrial level shunt, following an increase in RA pressure induced by provocative maneuvers. The PFO tunnel length was 1.5cm. About 25-30 bubbles noted with shunting crossing the PFO.  05/14/13 Transcranial Doppler with Bubble Study: Impression: This TCD bubble study is positive for the presence of a medium intracardiac or intrapulmonary right to left shunt. TEE ordered for verification.  03/27/13 Carotid U/S: Summary: - The vertebral arteries appear patent with antegrade flow. - Findings consistent with less than 39 percent stenosis involving the right internal carotid artery and the left internal carotid artery.  05/30/13 EEG: Impression: this is a normal awake and drowsy EEG. Please, be aware that a normal EEG does not exclude the possibility of epilepsy. Clinical correlation is advised.  10/05/15 CXR: IMPRESSION: No active cardiopulmonary disease.  11/03/13 PFT: Mmild obstructive airways disease with significant response to bronchodilator, normal lung volumes, mild diffusion defect. FVC 3.02/87%, FEV1 2.60/96%, FEV1/FVC 0.86, FEF 25-75% 4.50/174%, TLC 98%, DLCO 69%.  Preoperative labs noted. Urine culture showed multiple species present, suggest recollection.  H&P note states, "I had a long discussion with both the patient and her husband about that she has been cleared for this surgery by multiple physicians but she is at high risk for complications including infection  because of her rheumatoid medications, stroke because of her previous stroke and VSD, and possibly death. She and her understand these  risks. Post operatively we will use Eliquis for DVT and stroke Prophylaxis."  Patient with known large PFO and is the medical arm of the "reduce" study. Surgeon has discussed increased risks of CVA. Her cardiologist and pulmonologist are on board with surgery plans. I have updated anesthesiologist Dr. Randa Evens regarding above. If no acute changes then it is anticipated that she can proceed as planned.   Velna Ochs [facility] Short Stay Center/Anesthesiology Phone [phone] 10/22/2015 4:20 PM

## 2015-10-29 MED ORDER — LACTATED RINGERS IV SOLN: INTRAVENOUS | Status: DC

## 2015-10-29 MED ORDER — CHLORHEXIDINE GLUCONATE 4 % EX LIQD: 60.0000 mL | Freq: Once | CUTANEOUS | Status: DC

## 2015-10-29 MED ORDER — POVIDONE-IODINE 7.5 % EX SOLN
Freq: Once | CUTANEOUS | Status: DC
Start: 2015-11-01 — End: 2015-11-01
  Filled 2015-10-29: qty 118

## 2015-10-29 MED ORDER — VANCOMYCIN HCL IN DEXTROSE 1-5 GM/200ML-% IV SOLN: 1000.0000 mg | INTRAVENOUS | Status: DC

## 2015-11-01 ENCOUNTER — Ambulatory Visit (HOSPITAL_COMMUNITY)
Admission: RE | Admit: 2015-11-01 | Discharge: 2015-11-01 | Disposition: A | Discharge status: Home / Self Care | Payer: PPO | Source: Ambulatory Visit | Attending: Orthopedic Surgery | Admitting: Orthopedic Surgery

## 2015-11-01 ENCOUNTER — Encounter (HOSPITAL_COMMUNITY)
Admission: RE | Disposition: A | Discharge status: Home / Self Care | Payer: Self-pay | Source: Ambulatory Visit | Attending: Orthopedic Surgery

## 2015-11-01 DIAGNOSIS — Q210 Ventricular septal defect: Secondary | ICD-10-CM

## 2015-11-01 DIAGNOSIS — Q2112 Patent foramen ovale: Secondary | ICD-10-CM

## 2015-11-01 DIAGNOSIS — G4733 Obstructive sleep apnea (adult) (pediatric): Secondary | ICD-10-CM | POA: Diagnosis present

## 2015-11-01 DIAGNOSIS — R9431 Abnormal electrocardiogram [ECG] [EKG]: Secondary | ICD-10-CM | POA: Diagnosis present

## 2015-11-01 DIAGNOSIS — Z9889 Other specified postprocedural states: Secondary | ICD-10-CM

## 2015-11-01 DIAGNOSIS — M069 Rheumatoid arthritis, unspecified: Secondary | ICD-10-CM | POA: Diagnosis present

## 2015-11-01 DIAGNOSIS — R112 Nausea with vomiting, unspecified: Secondary | ICD-10-CM | POA: Diagnosis present

## 2015-11-01 DIAGNOSIS — R569 Unspecified convulsions: Secondary | ICD-10-CM

## 2015-11-01 DIAGNOSIS — F410 Panic disorder [episodic paroxysmal anxiety] without agoraphobia: Secondary | ICD-10-CM | POA: Diagnosis present

## 2015-11-01 DIAGNOSIS — G4730 Sleep apnea, unspecified: Secondary | ICD-10-CM | POA: Diagnosis present

## 2015-11-01 DIAGNOSIS — I639 Cerebral infarction, unspecified: Secondary | ICD-10-CM | POA: Diagnosis present

## 2015-11-01 DIAGNOSIS — G2581 Restless legs syndrome: Secondary | ICD-10-CM | POA: Diagnosis present

## 2015-11-01 DIAGNOSIS — K589 Irritable bowel syndrome without diarrhea: Secondary | ICD-10-CM | POA: Diagnosis present

## 2015-11-01 DIAGNOSIS — J4520 Mild intermittent asthma, uncomplicated: Secondary | ICD-10-CM | POA: Diagnosis present

## 2015-11-01 DIAGNOSIS — M797 Fibromyalgia: Secondary | ICD-10-CM | POA: Diagnosis present

## 2015-11-01 DIAGNOSIS — Z8619 Personal history of other infectious and parasitic diseases: Secondary | ICD-10-CM | POA: Diagnosis present

## 2015-11-01 DIAGNOSIS — J4540 Moderate persistent asthma, uncomplicated: Secondary | ICD-10-CM | POA: Diagnosis present

## 2015-11-01 DIAGNOSIS — Q211 Atrial septal defect: Secondary | ICD-10-CM

## 2015-11-01 DIAGNOSIS — G40209 Localization-related (focal) (partial) symptomatic epilepsy and epileptic syndromes with complex partial seizures, not intractable, without status epilepticus: Secondary | ICD-10-CM | POA: Diagnosis present

## 2015-11-01 SURGERY — ARTHROPLASTY, KNEE, TOTAL
Anesthesia: General | Laterality: Right

## 2015-11-01 NOTE — Anesthesia Preprocedure Evaluation (Deleted)
Anesthesia Evaluation  Patient identified by MRN, date of birth, ID band Patient awake    Reviewed: Allergy & Precautions, NPO status , Patient's Chart, lab work & pertinent test results  History of Anesthesia Complications (+) PONV and history of anesthetic complications  Airway        Dental   Pulmonary shortness of breath, asthma , sleep apnea , pneumonia, former smoker,    breath sounds clear to auscultation       Cardiovascular + Peripheral Vascular Disease   Rhythm:Regular Rate:Normal  Hx of PFO by Echo   Neuro/Psych Seizures -,   Neuromuscular disease CVA    GI/Hepatic GERD  ,  Endo/Other  Morbid obesity  Renal/GU      Musculoskeletal  (+) Arthritis , Fibromyalgia -  Abdominal (+) + obese,   Peds  Hematology   Anesthesia Other Findings   Reproductive/Obstetrics                             Anesthesia Physical Anesthesia Plan  ASA: III  Anesthesia Plan: General   Post-op Pain Management: GA combined w/ Regional for post-op pain   Induction: Intravenous  Airway Management Planned: Oral ETT  Additional Equipment:   Intra-op Plan:   Post-operative Plan: Extubation in OR  Informed Consent: I have reviewed the patients History and Physical, chart, labs and discussed the procedure including the risks, benefits and alternatives for the proposed anesthesia with the patient or authorized representative who has indicated his/her understanding and acceptance.   Dental advisory given  Plan Discussed with: CRNA and Surgeon  Anesthesia Plan Comments:         Anesthesia Quick Evaluation

## 2015-11-01 NOTE — Progress Notes (Signed)
Per Kirstin, procedure has been postponed due to patient's metal allergy.

## 2015-11-03 ENCOUNTER — Encounter (HOSPITAL_COMMUNITY)
Admission: RE | Disposition: A | Discharge status: Home Health | Payer: Self-pay | Source: Ambulatory Visit | Attending: Orthopedic Surgery

## 2015-11-03 ENCOUNTER — Inpatient Hospital Stay (HOSPITAL_COMMUNITY): Payer: PPO | Admitting: Certified Registered Nurse Anesthetist

## 2015-11-03 ENCOUNTER — Encounter (HOSPITAL_COMMUNITY): Payer: Self-pay | Admitting: Surgery

## 2015-11-03 ENCOUNTER — Inpatient Hospital Stay (HOSPITAL_COMMUNITY)
Admission: RE | Admit: 2015-11-03 | Discharge: 2015-11-05 | DRG: 470 | Disposition: A | Discharge status: Home Health | Payer: PPO | Source: Ambulatory Visit | Attending: Orthopedic Surgery | Admitting: Orthopedic Surgery

## 2015-11-03 DIAGNOSIS — Z881 Allergy status to other antibiotic agents status: Secondary | ICD-10-CM

## 2015-11-03 DIAGNOSIS — Z833 Family history of diabetes mellitus: Secondary | ICD-10-CM | POA: Diagnosis not present

## 2015-11-03 DIAGNOSIS — I4581 Long QT syndrome: Secondary | ICD-10-CM | POA: Diagnosis present

## 2015-11-03 DIAGNOSIS — Q210 Ventricular septal defect: Secondary | ICD-10-CM | POA: Diagnosis not present

## 2015-11-03 DIAGNOSIS — E785 Hyperlipidemia, unspecified: Secondary | ICD-10-CM | POA: Diagnosis present

## 2015-11-03 DIAGNOSIS — J309 Allergic rhinitis, unspecified: Secondary | ICD-10-CM | POA: Diagnosis present

## 2015-11-03 DIAGNOSIS — I739 Peripheral vascular disease, unspecified: Secondary | ICD-10-CM | POA: Diagnosis present

## 2015-11-03 DIAGNOSIS — F410 Panic disorder [episodic paroxysmal anxiety] without agoraphobia: Secondary | ICD-10-CM | POA: Diagnosis present

## 2015-11-03 DIAGNOSIS — K219 Gastro-esophageal reflux disease without esophagitis: Secondary | ICD-10-CM | POA: Diagnosis present

## 2015-11-03 DIAGNOSIS — Q211 Atrial septal defect: Secondary | ICD-10-CM | POA: Diagnosis not present

## 2015-11-03 DIAGNOSIS — Z882 Allergy status to sulfonamides status: Secondary | ICD-10-CM | POA: Diagnosis not present

## 2015-11-03 DIAGNOSIS — M1711 Unilateral primary osteoarthritis, right knee: Secondary | ICD-10-CM | POA: Diagnosis present

## 2015-11-03 DIAGNOSIS — M797 Fibromyalgia: Secondary | ICD-10-CM | POA: Diagnosis present

## 2015-11-03 DIAGNOSIS — Z803 Family history of malignant neoplasm of breast: Secondary | ICD-10-CM | POA: Diagnosis not present

## 2015-11-03 DIAGNOSIS — I639 Cerebral infarction, unspecified: Secondary | ICD-10-CM | POA: Diagnosis present

## 2015-11-03 DIAGNOSIS — Z8619 Personal history of other infectious and parasitic diseases: Secondary | ICD-10-CM | POA: Diagnosis not present

## 2015-11-03 DIAGNOSIS — R9431 Abnormal electrocardiogram [ECG] [EKG]: Secondary | ICD-10-CM | POA: Diagnosis present

## 2015-11-03 DIAGNOSIS — Z87891 Personal history of nicotine dependence: Secondary | ICD-10-CM

## 2015-11-03 DIAGNOSIS — Q279 Congenital malformation of peripheral vascular system, unspecified: Secondary | ICD-10-CM | POA: Diagnosis not present

## 2015-11-03 DIAGNOSIS — Z883 Allergy status to other anti-infective agents status: Secondary | ICD-10-CM | POA: Diagnosis not present

## 2015-11-03 DIAGNOSIS — F419 Anxiety disorder, unspecified: Secondary | ICD-10-CM | POA: Diagnosis present

## 2015-11-03 DIAGNOSIS — Z880 Allergy status to penicillin: Secondary | ICD-10-CM | POA: Diagnosis not present

## 2015-11-03 DIAGNOSIS — Q2112 Patent foramen ovale: Secondary | ICD-10-CM

## 2015-11-03 DIAGNOSIS — E739 Lactose intolerance, unspecified: Secondary | ICD-10-CM | POA: Diagnosis present

## 2015-11-03 DIAGNOSIS — F329 Major depressive disorder, single episode, unspecified: Secondary | ICD-10-CM | POA: Diagnosis present

## 2015-11-03 DIAGNOSIS — Z8041 Family history of malignant neoplasm of ovary: Secondary | ICD-10-CM | POA: Diagnosis not present

## 2015-11-03 DIAGNOSIS — Z888 Allergy status to other drugs, medicaments and biological substances status: Secondary | ICD-10-CM | POA: Diagnosis not present

## 2015-11-03 DIAGNOSIS — M069 Rheumatoid arthritis, unspecified: Secondary | ICD-10-CM | POA: Diagnosis present

## 2015-11-03 DIAGNOSIS — Z8249 Family history of ischemic heart disease and other diseases of the circulatory system: Secondary | ICD-10-CM | POA: Diagnosis not present

## 2015-11-03 DIAGNOSIS — M21161 Varus deformity, not elsewhere classified, right knee: Secondary | ICD-10-CM | POA: Diagnosis present

## 2015-11-03 DIAGNOSIS — Z8673 Personal history of transient ischemic attack (TIA), and cerebral infarction without residual deficits: Secondary | ICD-10-CM

## 2015-11-03 DIAGNOSIS — G4733 Obstructive sleep apnea (adult) (pediatric): Secondary | ICD-10-CM | POA: Diagnosis present

## 2015-11-03 DIAGNOSIS — M1710 Unilateral primary osteoarthritis, unspecified knee: Secondary | ICD-10-CM | POA: Diagnosis present

## 2015-11-03 DIAGNOSIS — R569 Unspecified convulsions: Secondary | ICD-10-CM

## 2015-11-03 DIAGNOSIS — M179 Osteoarthritis of knee, unspecified: Secondary | ICD-10-CM | POA: Diagnosis present

## 2015-11-03 DIAGNOSIS — G40209 Localization-related (focal) (partial) symptomatic epilepsy and epileptic syndromes with complex partial seizures, not intractable, without status epilepticus: Secondary | ICD-10-CM | POA: Diagnosis present

## 2015-11-03 HISTORY — DX: Unilateral primary osteoarthritis, right knee: M17.11

## 2015-11-03 HISTORY — PX: TOTAL KNEE ARTHROPLASTY: SHX125

## 2015-11-03 LAB — CBC
HEMATOCRIT: 39.7 % (ref 36.0–46.0)
Hemoglobin: 13.3 g/dL (ref 12.0–15.0)
MCH: 34.7 pg — AB (ref 26.0–34.0)
MCHC: 33.5 g/dL (ref 30.0–36.0)
MCV: 103.7 fL — AB (ref 78.0–100.0)
PLATELETS: 161 10*3/uL (ref 150–400)
RBC: 3.83 MIL/uL — ABNORMAL LOW (ref 3.87–5.11)
RDW: 12.6 % (ref 11.5–15.5)
WBC: 12.6 10*3/uL — ABNORMAL HIGH (ref 4.0–10.5)

## 2015-11-03 LAB — CREATININE, SERUM
CREATININE: 0.64 mg/dL (ref 0.44–1.00)
GFR calc Af Amer: >60 mL/min (ref >60)
GFR calc non Af Amer: >60 mL/min (ref >60)

## 2015-11-03 SURGERY — ARTHROPLASTY, KNEE, TOTAL
Anesthesia: General | Site: Knee | Laterality: Right

## 2015-11-03 MED ORDER — FOLIC ACID 1 MG PO TABS
1.0000 mg | ORAL_TABLET | Freq: Every day | ORAL | Status: DC
  Administered 2015-11-03 – 2015-11-05 (×3): 1 mg via ORAL
  Filled 2015-11-03 (×3): qty 1

## 2015-11-03 MED ORDER — SODIUM CHLORIDE 0.9 % IR SOLN
Status: DC | PRN
  Administered 2015-11-03: 1000 mL
  Administered 2015-11-03: 3000 mL

## 2015-11-03 MED ORDER — PROMETHAZINE HCL 25 MG/ML IJ SOLN: 6.2500 mg | INTRAMUSCULAR | Status: DC | PRN

## 2015-11-03 MED ORDER — ALPRAZOLAM 0.5 MG PO TABS
0.5000 mg | ORAL_TABLET | Freq: Three times a day (TID) | ORAL | Status: DC | PRN
  Administered 2015-11-03 – 2015-11-05 (×4): 0.5 mg via ORAL
  Filled 2015-11-03 (×4): qty 1

## 2015-11-03 MED ORDER — BUPIVACAINE-EPINEPHRINE (PF) 0.25% -1:200000 IJ SOLN
INTRAMUSCULAR | Status: AC
  Filled 2015-11-03: qty 30

## 2015-11-03 MED ORDER — PROPOFOL 500 MG/50ML IV EMUL
INTRAVENOUS | Status: DC | PRN
  Administered 2015-11-03: 25 ug/kg/min via INTRAVENOUS

## 2015-11-03 MED ORDER — TOPIRAMATE 25 MG PO TABS
50.0000 mg | ORAL_TABLET | Freq: Two times a day (BID) | ORAL | Status: DC
  Administered 2015-11-03 – 2015-11-05 (×4): 50 mg via ORAL
  Filled 2015-11-03 (×4): qty 2

## 2015-11-03 MED ORDER — FENTANYL CITRATE (PF) 250 MCG/5ML IJ SOLN
INTRAMUSCULAR | Status: DC | PRN
  Administered 2015-11-03 (×2): 50 ug via INTRAVENOUS
  Administered 2015-11-03: 100 ug via INTRAVENOUS
  Administered 2015-11-03: 50 ug via INTRAVENOUS

## 2015-11-03 MED ORDER — ACETAMINOPHEN 650 MG RE SUPP: 650.0000 mg | Freq: Four times a day (QID) | RECTAL | Status: DC | PRN

## 2015-11-03 MED ORDER — CEFAZOLIN SODIUM-DEXTROSE 2-3 GM-% IV SOLR
INTRAVENOUS | Status: DC | PRN
  Administered 2015-11-03: 2 g via INTRAVENOUS

## 2015-11-03 MED ORDER — DOCUSATE SODIUM 100 MG PO CAPS
100.0000 mg | ORAL_CAPSULE | Freq: Two times a day (BID) | ORAL | Status: DC
  Administered 2015-11-03 – 2015-11-05 (×5): 100 mg via ORAL
  Filled 2015-11-03 (×5): qty 1

## 2015-11-03 MED ORDER — ACETAMINOPHEN 325 MG PO TABS: 650.0000 mg | ORAL_TABLET | Freq: Four times a day (QID) | ORAL | Status: DC | PRN

## 2015-11-03 MED ORDER — PROPOFOL 10 MG/ML IV BOLUS
INTRAVENOUS | Status: AC
  Filled 2015-11-03: qty 20

## 2015-11-03 MED ORDER — ONDANSETRON HCL 4 MG/2ML IJ SOLN
4.0000 mg | Freq: Four times a day (QID) | INTRAMUSCULAR | Status: DC | PRN
  Administered 2015-11-05: 4 mg via INTRAVENOUS
  Filled 2015-11-03: qty 2

## 2015-11-03 MED ORDER — OXYCODONE HCL ER 10 MG PO T12A
20.0000 mg | EXTENDED_RELEASE_TABLET | Freq: Two times a day (BID) | ORAL | Status: DC
  Administered 2015-11-03 – 2015-11-05 (×5): 20 mg via ORAL
  Filled 2015-11-03 (×5): qty 2

## 2015-11-03 MED ORDER — ARTIFICIAL TEARS OP OINT
TOPICAL_OINTMENT | OPHTHALMIC | Status: DC | PRN
  Administered 2015-11-03: 1 via OPHTHALMIC

## 2015-11-03 MED ORDER — CITALOPRAM HYDROBROMIDE 40 MG PO TABS
40.0000 mg | ORAL_TABLET | Freq: Every day | ORAL | Status: DC
  Administered 2015-11-03 – 2015-11-05 (×3): 40 mg via ORAL
  Filled 2015-11-03 (×3): qty 1

## 2015-11-03 MED ORDER — ONDANSETRON HCL 4 MG/2ML IJ SOLN
INTRAMUSCULAR | Status: AC
  Filled 2015-11-03: qty 2

## 2015-11-03 MED ORDER — CEFAZOLIN SODIUM-DEXTROSE 2-3 GM-% IV SOLR
2.0000 g | Freq: Four times a day (QID) | INTRAVENOUS | Status: AC
  Administered 2015-11-03 (×2): 2 g via INTRAVENOUS
  Filled 2015-11-03 (×2): qty 50

## 2015-11-03 MED ORDER — POTASSIUM CHLORIDE IN NACL 20-0.9 MEQ/L-% IV SOLN
INTRAVENOUS | Status: DC
  Administered 2015-11-03: 14:00:00 via INTRAVENOUS
  Filled 2015-11-03: qty 1000

## 2015-11-03 MED ORDER — PROPOFOL 10 MG/ML IV BOLUS
INTRAVENOUS | Status: DC | PRN
  Administered 2015-11-03 (×2): 50 mg via INTRAVENOUS
  Administered 2015-11-03: 150 mg via INTRAVENOUS
  Administered 2015-11-03: 50 mg via INTRAVENOUS

## 2015-11-03 MED ORDER — METOPROLOL TARTRATE 1 MG/ML IV SOLN
INTRAVENOUS | Status: DC | PRN
  Administered 2015-11-03 (×2): 1 mg via INTRAVENOUS

## 2015-11-03 MED ORDER — SUCCINYLCHOLINE CHLORIDE 20 MG/ML IJ SOLN
INTRAMUSCULAR | Status: DC | PRN
  Administered 2015-11-03: 120 mg via INTRAVENOUS

## 2015-11-03 MED ORDER — METOCLOPRAMIDE HCL 5 MG PO TABS: 5.0000 mg | ORAL_TABLET | Freq: Three times a day (TID) | ORAL | Status: DC | PRN

## 2015-11-03 MED ORDER — DEXAMETHASONE SODIUM PHOSPHATE 10 MG/ML IJ SOLN
INTRAMUSCULAR | Status: DC | PRN
  Administered 2015-11-03: 10 mg via INTRAVENOUS

## 2015-11-03 MED ORDER — CLOBETASOL PROPIONATE 0.05 % EX OINT
1.0000 "application " | TOPICAL_OINTMENT | Freq: Two times a day (BID) | CUTANEOUS | Status: DC | PRN
  Filled 2015-11-03: qty 15

## 2015-11-03 MED ORDER — LIDOCAINE HCL (CARDIAC) 20 MG/ML IV SOLN
INTRAVENOUS | Status: AC
  Filled 2015-11-03: qty 5

## 2015-11-03 MED ORDER — METOPROLOL TARTRATE 1 MG/ML IV SOLN
INTRAVENOUS | Status: AC
  Filled 2015-11-03: qty 5

## 2015-11-03 MED ORDER — CEFAZOLIN SODIUM-DEXTROSE 2-3 GM-% IV SOLR
INTRAVENOUS | Status: AC
  Filled 2015-11-03: qty 50

## 2015-11-03 MED ORDER — OXYCODONE HCL 5 MG PO TABS
5.0000 mg | ORAL_TABLET | ORAL | Status: DC | PRN
  Administered 2015-11-03 – 2015-11-05 (×7): 10 mg via ORAL
  Filled 2015-11-03 (×8): qty 2

## 2015-11-03 MED ORDER — BUPIVACAINE-EPINEPHRINE (PF) 0.5% -1:200000 IJ SOLN
INTRAMUSCULAR | Status: DC | PRN
  Administered 2015-11-03: 25 mL via PERINEURAL

## 2015-11-03 MED ORDER — CEFUROXIME SODIUM 1.5 G IJ SOLR
INTRAMUSCULAR | Status: DC | PRN
  Administered 2015-11-03: 1.5 g

## 2015-11-03 MED ORDER — GLYCOPYRROLATE 0.2 MG/ML IJ SOLN
INTRAMUSCULAR | Status: AC
  Filled 2015-11-03: qty 1

## 2015-11-03 MED ORDER — PHENOL 1.4 % MT LIQD: 1.0000 | OROMUCOSAL | Status: DC | PRN

## 2015-11-03 MED ORDER — ONDANSETRON HCL 4 MG PO TABS: 4.0000 mg | ORAL_TABLET | Freq: Four times a day (QID) | ORAL | Status: DC | PRN

## 2015-11-03 MED ORDER — HYDROMORPHONE HCL 1 MG/ML IJ SOLN
INTRAMUSCULAR | Status: AC
  Filled 2015-11-03: qty 1

## 2015-11-03 MED ORDER — PROBIOTIC DAILY PO CAPS: 1.0000 | ORAL_CAPSULE | Freq: Every day | ORAL | Status: DC

## 2015-11-03 MED ORDER — DIPHENHYDRAMINE HCL 12.5 MG/5ML PO ELIX
12.5000 mg | ORAL_SOLUTION | ORAL | Status: DC | PRN
  Administered 2015-11-03 – 2015-11-04 (×5): 25 mg via ORAL
  Filled 2015-11-03 (×5): qty 10

## 2015-11-03 MED ORDER — BUPIVACAINE-EPINEPHRINE 0.25% -1:200000 IJ SOLN
INTRAMUSCULAR | Status: DC | PRN
  Administered 2015-11-03: 30 mL

## 2015-11-03 MED ORDER — LACTATED RINGERS IV SOLN
INTRAVENOUS | Status: DC | PRN
  Administered 2015-11-03 (×2): via INTRAVENOUS

## 2015-11-03 MED ORDER — MENTHOL 3 MG MT LOZG: 1.0000 | LOZENGE | OROMUCOSAL | Status: DC | PRN

## 2015-11-03 MED ORDER — CEFUROXIME SODIUM 1.5 G IJ SOLR
INTRAMUSCULAR | Status: AC
  Filled 2015-11-03: qty 1.5

## 2015-11-03 MED ORDER — CYCLOSPORINE 0.05 % OP EMUL
4.0000 [drp] | Freq: Two times a day (BID) | OPHTHALMIC | Status: DC
  Filled 2015-11-03: qty 1

## 2015-11-03 MED ORDER — ENOXAPARIN SODIUM 30 MG/0.3ML ~~LOC~~ SOLN
30.0000 mg | Freq: Two times a day (BID) | SUBCUTANEOUS | Status: DC
  Administered 2015-11-04 – 2015-11-05 (×3): 30 mg via SUBCUTANEOUS
  Filled 2015-11-03 (×3): qty 0.3

## 2015-11-03 MED ORDER — ALBUTEROL SULFATE (2.5 MG/3ML) 0.083% IN NEBU: 3.0000 mL | INHALATION_SOLUTION | Freq: Four times a day (QID) | RESPIRATORY_TRACT | Status: DC | PRN

## 2015-11-03 MED ORDER — DEXAMETHASONE SODIUM PHOSPHATE 10 MG/ML IJ SOLN
INTRAMUSCULAR | Status: AC
  Filled 2015-11-03: qty 1

## 2015-11-03 MED ORDER — POLYETHYLENE GLYCOL 3350 17 G PO PACK
17.0000 g | PACK | Freq: Two times a day (BID) | ORAL | Status: DC
  Administered 2015-11-03 – 2015-11-05 (×5): 17 g via ORAL
  Filled 2015-11-03 (×5): qty 1

## 2015-11-03 MED ORDER — ALUM & MAG HYDROXIDE-SIMETH 200-200-20 MG/5ML PO SUSP
30.0000 mL | ORAL | Status: DC | PRN
Start: 2015-11-03 — End: 2015-11-05

## 2015-11-03 MED ORDER — DIVALPROEX SODIUM ER 500 MG PO TB24
1000.0000 mg | ORAL_TABLET | Freq: Two times a day (BID) | ORAL | Status: DC
  Administered 2015-11-03 – 2015-11-05 (×4): 1000 mg via ORAL
  Filled 2015-11-03 (×6): qty 2

## 2015-11-03 MED ORDER — GLYCOPYRROLATE 0.2 MG/ML IJ SOLN
INTRAMUSCULAR | Status: DC | PRN
  Administered 2015-11-03 (×2): 0.1 mg via INTRAVENOUS

## 2015-11-03 MED ORDER — METOCLOPRAMIDE HCL 5 MG/ML IJ SOLN: 5.0000 mg | Freq: Three times a day (TID) | INTRAMUSCULAR | Status: DC | PRN

## 2015-11-03 MED ORDER — FLUTICASONE PROPIONATE 50 MCG/ACT NA SUSP
2.0000 | Freq: Every day | NASAL | Status: DC | PRN
  Filled 2015-11-03: qty 16

## 2015-11-03 MED ORDER — HYDROMORPHONE HCL 1 MG/ML IJ SOLN
0.2500 mg | INTRAMUSCULAR | Status: DC | PRN
  Administered 2015-11-03 (×3): 0.5 mg via INTRAVENOUS

## 2015-11-03 MED ORDER — DEXAMETHASONE SODIUM PHOSPHATE 10 MG/ML IJ SOLN
10.0000 mg | Freq: Three times a day (TID) | INTRAMUSCULAR | Status: AC
  Administered 2015-11-03 – 2015-11-04 (×4): 10 mg via INTRAVENOUS
  Filled 2015-11-03 (×4): qty 1

## 2015-11-03 MED ORDER — PRAVASTATIN SODIUM 20 MG PO TABS
20.0000 mg | ORAL_TABLET | Freq: Every day | ORAL | Status: DC
  Administered 2015-11-03 – 2015-11-04 (×2): 20 mg via ORAL
  Filled 2015-11-03 (×2): qty 1

## 2015-11-03 MED ORDER — PANTOPRAZOLE SODIUM 40 MG PO TBEC
40.0000 mg | DELAYED_RELEASE_TABLET | Freq: Every day | ORAL | Status: DC
  Administered 2015-11-04 – 2015-11-05 (×2): 40 mg via ORAL
  Filled 2015-11-03 (×2): qty 1

## 2015-11-03 MED ORDER — HYDROMORPHONE HCL 1 MG/ML IJ SOLN
1.0000 mg | INTRAMUSCULAR | Status: DC | PRN
  Administered 2015-11-03 – 2015-11-05 (×6): 1 mg via INTRAVENOUS
  Filled 2015-11-03 (×6): qty 1

## 2015-11-03 MED ORDER — MIDAZOLAM HCL 2 MG/2ML IJ SOLN
INTRAMUSCULAR | Status: DC | PRN
  Administered 2015-11-03: 2 mg via INTRAVENOUS

## 2015-11-03 MED ORDER — RISAQUAD PO CAPS
1.0000 | ORAL_CAPSULE | Freq: Every day | ORAL | Status: DC
  Administered 2015-11-04 – 2015-11-05 (×2): 1 via ORAL
  Filled 2015-11-03 (×2): qty 1

## 2015-11-03 MED ORDER — HYDROMORPHONE HCL 1 MG/ML IJ SOLN
INTRAMUSCULAR | Status: AC
  Administered 2015-11-03: 0.5 mg via INTRAVENOUS
  Filled 2015-11-03: qty 1

## 2015-11-03 MED ORDER — MIDAZOLAM HCL 2 MG/2ML IJ SOLN
INTRAMUSCULAR | Status: AC
  Filled 2015-11-03: qty 2

## 2015-11-03 MED ORDER — FENTANYL CITRATE (PF) 250 MCG/5ML IJ SOLN
INTRAMUSCULAR | Status: AC
  Filled 2015-11-03: qty 5

## 2015-11-03 MED ORDER — LORATADINE 10 MG PO TABS
10.0000 mg | ORAL_TABLET | Freq: Every day | ORAL | Status: DC
  Administered 2015-11-04 – 2015-11-05 (×2): 10 mg via ORAL
  Filled 2015-11-03 (×2): qty 1

## 2015-11-03 MED ORDER — LIDOCAINE HCL (CARDIAC) 20 MG/ML IV SOLN
INTRAVENOUS | Status: DC | PRN
  Administered 2015-11-03: 60 mg via INTRATRACHEAL

## 2015-11-03 SURGICAL SUPPLY — 80 items
APL SKNCLS STERI-STRIP NONHPOA (GAUZE/BANDAGES/DRESSINGS) ×1
BANDAGE ESMARK 6X9 LF (GAUZE/BANDAGES/DRESSINGS) ×1 IMPLANT
BENZOIN TINCTURE PRP APPL 2/3 (GAUZE/BANDAGES/DRESSINGS) ×3 IMPLANT
BLADE SAGITTAL 25.0X1.19X90 (BLADE) ×2 IMPLANT
BLADE SAGITTAL 25.0X1.19X90MM (BLADE) ×1
BLADE SAW RECIP 87.9 MT (BLADE) IMPLANT
BLADE SAW SAG 90X13X1.27 (BLADE) ×3 IMPLANT
BLADE SURG 10 STRL SS (BLADE) ×6 IMPLANT
BNDG CMPR 9X6 STRL LF SNTH (GAUZE/BANDAGES/DRESSINGS) ×1
BNDG CMPR MED 10X6 ELC LF (GAUZE/BANDAGES/DRESSINGS) ×1
BNDG CMPR MED 15X6 ELC VLCR LF (GAUZE/BANDAGES/DRESSINGS) ×1
BNDG ELASTIC 6X10 VLCR STRL LF (GAUZE/BANDAGES/DRESSINGS) ×2 IMPLANT
BNDG ELASTIC 6X15 VLCR STRL LF (GAUZE/BANDAGES/DRESSINGS) ×3 IMPLANT
BNDG ESMARK 6X9 LF (GAUZE/BANDAGES/DRESSINGS) ×3
BOWL SMART MIX CTS (DISPOSABLE) ×3 IMPLANT
CAPT KNEE TOTAL 3 ×2 IMPLANT
CEMENT HV SMART SET (Cement) ×6 IMPLANT
CLOSURE STERI-STRIP 1/2X4 (GAUZE/BANDAGES/DRESSINGS) ×1
CLOSURE WOUND 1/2 X4 (GAUZE/BANDAGES/DRESSINGS) ×1
CLSR STERI-STRIP ANTIMIC 1/2X4 (GAUZE/BANDAGES/DRESSINGS) ×1 IMPLANT
COVER SURGICAL LIGHT HANDLE (MISCELLANEOUS) ×3 IMPLANT
CUFF TOURNIQUET SINGLE 34IN LL (TOURNIQUET CUFF) ×3 IMPLANT
CUFF TOURNIQUET SINGLE 44IN (TOURNIQUET CUFF) IMPLANT
DECANTER SPIKE VIAL GLASS SM (MISCELLANEOUS) ×3 IMPLANT
DRAPE EXTREMITY T 121X128X90 (DRAPE) ×3 IMPLANT
DRAPE INCISE IOBAN 66X45 STRL (DRAPES) ×3 IMPLANT
DRAPE PROXIMA HALF (DRAPES) ×3 IMPLANT
DRAPE U-SHAPE 47X51 STRL (DRAPES) ×3 IMPLANT
DRSG AQUACEL AG ADV 3.5X10 (GAUZE/BANDAGES/DRESSINGS) ×3 IMPLANT
DRSG AQUACEL AG ADV 3.5X14 (GAUZE/BANDAGES/DRESSINGS) ×3 IMPLANT
DURAPREP 26ML APPLICATOR (WOUND CARE) ×6 IMPLANT
ELECT CAUTERY BLADE 6.4 (BLADE) ×3 IMPLANT
ELECT REM PT RETURN 9FT ADLT (ELECTROSURGICAL) ×3
ELECTRODE REM PT RTRN 9FT ADLT (ELECTROSURGICAL) ×1 IMPLANT
FACESHIELD STD STERILE (MASK) ×2 IMPLANT
FACESHIELD WRAPAROUND (MASK) ×3 IMPLANT
FACESHIELD WRAPAROUND OR TEAM (MASK) ×1 IMPLANT
GAUZE SPONGE 4X4 12PLY STRL (GAUZE/BANDAGES/DRESSINGS) ×3 IMPLANT
GLOVE BIO SURGEON STRL SZ7 (GLOVE) ×3 IMPLANT
GLOVE BIOGEL PI IND STRL 7.0 (GLOVE) ×1 IMPLANT
GLOVE BIOGEL PI IND STRL 7.5 (GLOVE) ×1 IMPLANT
GLOVE BIOGEL PI INDICATOR 7.0 (GLOVE) ×2
GLOVE BIOGEL PI INDICATOR 7.5 (GLOVE) ×2
GLOVE SS BIOGEL STRL SZ 7.5 (GLOVE) ×1 IMPLANT
GLOVE SUPERSENSE BIOGEL SZ 7.5 (GLOVE) ×2
GOWN STRL REUS W/ TWL LRG LVL3 (GOWN DISPOSABLE) ×1 IMPLANT
GOWN STRL REUS W/ TWL XL LVL3 (GOWN DISPOSABLE) ×2 IMPLANT
GOWN STRL REUS W/TWL LRG LVL3 (GOWN DISPOSABLE) ×3
GOWN STRL REUS W/TWL XL LVL3 (GOWN DISPOSABLE) ×6
HANDPIECE INTERPULSE COAX TIP (DISPOSABLE) ×3
HOOD PEEL AWAY FACE SHEILD DIS (HOOD) ×6 IMPLANT
IMMOBILIZER KNEE 22 UNIV (SOFTGOODS) ×3 IMPLANT
KIT BASIN OR (CUSTOM PROCEDURE TRAY) ×3 IMPLANT
KIT ROOM TURNOVER OR (KITS) ×3 IMPLANT
MANIFOLD NEPTUNE II (INSTRUMENTS) ×3 IMPLANT
MARKER SKIN DUAL TIP RULER LAB (MISCELLANEOUS) ×3 IMPLANT
NDL 18GX1X1/2 (RX/OR ONLY) (NEEDLE) ×1 IMPLANT
NEEDLE 18GX1X1/2 (RX/OR ONLY) (NEEDLE) ×3 IMPLANT
NEEDLE 22X1 1/2 (OR ONLY) (NEEDLE) ×2 IMPLANT
NS IRRIG 1000ML POUR BTL (IV SOLUTION) ×3 IMPLANT
PACK TOTAL JOINT (CUSTOM PROCEDURE TRAY) ×3 IMPLANT
PACK UNIVERSAL I (CUSTOM PROCEDURE TRAY) ×3 IMPLANT
PAD ARMBOARD 7.5X6 YLW CONV (MISCELLANEOUS) ×6 IMPLANT
SET HNDPC FAN SPRY TIP SCT (DISPOSABLE) ×1 IMPLANT
STRIP CLOSURE SKIN 1/2X4 (GAUZE/BANDAGES/DRESSINGS) ×2 IMPLANT
SUCTION FRAZIER TIP 10 FR DISP (SUCTIONS) ×3 IMPLANT
SUT MNCRL AB 3-0 PS2 18 (SUTURE) ×3 IMPLANT
SUT VIC AB 0 CT1 27 (SUTURE) ×6
SUT VIC AB 0 CT1 27XBRD ANBCTR (SUTURE) ×2 IMPLANT
SUT VIC AB 1 CT1 27 (SUTURE) ×3
SUT VIC AB 1 CT1 27XBRD ANBCTR (SUTURE) ×1 IMPLANT
SUT VIC AB 2-0 CT1 27 (SUTURE) ×6
SUT VIC AB 2-0 CT1 TAPERPNT 27 (SUTURE) ×2 IMPLANT
SYR 30ML SLIP (SYRINGE) ×3 IMPLANT
TOWEL OR 17X24 6PK STRL BLUE (TOWEL DISPOSABLE) ×3 IMPLANT
TOWEL OR 17X26 10 PK STRL BLUE (TOWEL DISPOSABLE) ×3 IMPLANT
TRAY FOLEY CATH 16FR SILVER (SET/KITS/TRAYS/PACK) ×3 IMPLANT
TUBE CONNECTING 12'X1/4 (SUCTIONS) ×1
TUBE CONNECTING 12X1/4 (SUCTIONS) ×2 IMPLANT
YANKAUER SUCT BULB TIP NO VENT (SUCTIONS) ×3 IMPLANT

## 2015-11-03 NOTE — H&P (View-Only) (Signed)
TOTAL KNEE ADMISSION H&P  Patient is being admitted for right total knee arthroplasty.  Subjective:  Chief Complaint:right knee pain.  HPI: [name], 55 y.o. female, has a history of pain and functional disability in the right knee due to arthritis and has failed non-surgical conservative treatments for greater than 12 weeks to includeNSAID's and/or analgesics, corticosteriod injections, viscosupplementation injections, flexibility and strengthening excercises, supervised PT with diminished ADL's post treatment, use of assistive devices, weight reduction as appropriate and activity modification.  Onset of symptoms was gradual, starting 10 years ago with gradually worsening course since that time. The patient noted no past surgery on the right knee(s).  Patient currently rates pain in the right knee(s) at 10 out of 10 with activity. Patient has night pain, worsening of pain with activity and weight bearing, pain that interferes with activities of daily living, crepitus and joint swelling.  Patient has evidence of subchondral sclerosis, periarticular osteophytes and joint space narrowing by imaging studies.  There is no active infection.  Patient Active Problem List   Diagnosis Date Noted  . Rheumatoid arthritis (HCC)   . PONV (postoperative nausea and vomiting)   . Seizures (HCC)   . Fibromyalgia   . Sleep apnea   . Ventricular septal defect   . History of staph infection   . Refractory migraine 08/10/2014  . Complex partial epilepsy (HCC) 08/10/2014  . S/P tonsillectomy and adenoidectomy 04/01/2014  . Panic anxiety syndrome 06/03/2013  . Positive depression screening 06/03/2013  . Headache(784.0) 06/03/2013  . Unspecified cerebral artery occlusion with cerebral infarction 06/03/2013  . Headache 05/29/2013  . Patent foramen ovale 05/29/2013  . Chest pain 05/29/2013  . Other convulsions 05/29/2013  . Dysphagia, unspecified(787.20) 04/17/2013  . Acute bronchitis 03/27/2013  .  Persistent headaches 03/26/2013  . CVA (cerebral infarction) history 03/26/2013  . Hypokalemia 03/26/2013  . Leukocytosis 03/26/2013  . Arthritis 03/26/2013  . QT prolongation 03/26/2013  . Stroke (HCC) 03/26/2013  . Recurrent ventral incisional hernia 04/19/2012  . Obstructive sleep apnea 08/22/2010  . RESTLESS LEGS SYNDROME 08/22/2010  . Moderate intermittent asthma without complication 08/22/2010  . ECZEMA 08/22/2010  . Irritable bowel syndrome 06/23/2010  . DIARRHEA 06/23/2010  . WEIGHT LOSS 11/01/2009  . NAUSEA WITH VOMITING 11/01/2009  . ABDOMINAL PAIN, LOWER 11/01/2009  . KNEE, ARTHRITIS, DEGEN./OSTEO 11/18/2008  . DERANGEMENT MENISCUS 11/02/2008  . JOINT EFFUSION, KNEE 11/02/2008  . KNEE PAIN 11/02/2008   Past Medical History  Diagnosis Date  . IBS (irritable bowel syndrome)     mixed  . Allergic rhinitis     uses Flonase daily as needed and takes CLaritin daily  . Eczema   . Plaque psoriasis   . Cerebral vascular malformation     sees dr Lovell Sheehan for monitoring as needed, sees dr lewitt for headaches every 4 months  . Lactose intolerance   . Anxiety     takes Xanax daily as needed  . Asthma     Albuterol inhaler prn;SYmbicort daily  . Depression     takes Citalopram daily  . Rheumatoid arthritis(714.0) 2010    oa and ra;Rhemicade IV every 6wks and Metotrexate weekly  . Nausea     takes Zofran daily as needed  . GERD (gastroesophageal reflux disease)     takes Protonix daily  . Hyperlipidemia     takes Pravastatin daily  . PONV (postoperative nausea and vomiting)   . Shortness of breath     with exertion  . Pneumonia 15yrs ago  hx of  . History of bronchitis as a child   . History of migraine     last one 10+yrs ago  . Seizures (HCC) 6months ago    takes Depakote daily  . Stroke (HCC) 03/26/2013    left sided weakness  . Joint pain   . Joint swelling   . Fibromyalgia   . Diverticulitis at age 36  . History of kidney stones   . Thyroid cyst   .  Insomnia   . Sleep apnea     study done >101yrs ago;uses CPAP nightly  . History of staph infection 24yrs ago  . Ventricular septal defect     Past Surgical History  Procedure Laterality Date  . Incontinence surgery  2010    sling done   . Knee arthroscopy  1992    left  . Foot surgery  1999    right ankle  . Kidney stone surgery  2001  . Appendectomy  1981  . Cholecystectomy  1981  . Tubal ligation  1990  . Total abdominal hysterectomy  2001  . Hernia repair  2006  . Left foot plating and scarping for arthritis  2011  . Right ear tube insertion  2011  . Incisional hernia repair  05/20/2012    Procedure: HERNIA REPAIR INCISIONAL;  Surgeon: Clovis Pu. Cornett, MD;  Location: [facility] ORS;  Service: General;  Laterality: N/A;  . Tee without cardioversion N/A 05/15/2013    Procedure: TRANSESOPHAGEAL ECHOCARDIOGRAM (TEE);  Surgeon: Pamella Pert, MD;  Location: [facility] ENDOSCOPY;  Service: Cardiovascular;  Laterality: N/A;  . Cardiac catheterization  2004  . Colonoscopy    . Thryoid biopsy    . Tonsillectomy and adenoidectomy  04/01/2014  . Tonsillectomy and adenoidectomy Bilateral 04/01/2014    Procedure: BILATERAL TONSILLECTOMY AND ADENOIDECTOMY;  Surgeon: Darletta Moll, MD;  Location: [facility] OR;  Service: ENT;  Laterality: Bilateral;    No prescriptions prior to admission   No current facility-administered medications for this encounter.  Current outpatient prescriptions:  .  Abatacept (ORENCIA IV), Inject 75 mg into the vein every 30 (thirty) days., Disp: , Rfl:  .  albuterol (PROVENTIL HFA;VENTOLIN HFA) 108 (90 BASE) MCG/ACT inhaler, Inhale 2 puffs into the lungs every 6 (six) hours as needed for wheezing or shortness of breath., Disp: , Rfl:  .  ALPRAZolam (XANAX) 0.5 MG tablet, Take 0.5 mg by mouth 2 (two) times daily as needed for anxiety. , Disp: , Rfl:  .  aspirin 81 MG tablet, Take 81 mg by mouth daily., Disp: , Rfl:  .  citalopram (CELEXA) 20 MG tablet, TAKE TWO TABLETS BY MOUTH DAILY,  Disp: 60 tablet, Rfl: 6 .  clobetasol ointment (TEMOVATE) 0.05 %, Apply 1 application topically 2 (two) times daily as needed (inflammation on feet.). , Disp: , Rfl:  .  cycloSPORINE (RESTASIS) 0.05 % ophthalmic emulsion, Place 4 drops into both eyes 2 (two) times daily., Disp: , Rfl:  .  divalproex (DEPAKOTE ER) 500 MG 24 hr tablet, TAKE TWO TABLETS BY MOUTH TWICE DAILY, Disp: 120 tablet, Rfl: 3 .  fluticasone (FLONASE) 50 MCG/ACT nasal spray, Place 2 sprays into both nostrils daily as needed for allergies. , Disp: , Rfl:  .  folic acid (FOLVITE) 1 MG tablet, Take 1 mg by mouth daily., Disp: , Rfl:  .  HYDROcodone-acetaminophen (NORCO/VICODIN) 5-325 MG per tablet, Take 1 tablet by mouth every 6 (six) hours as needed for moderate pain., Disp: , Rfl:  .  InFLIXimab (REMICADE  IV), Inject 3 mg/kg into the vein every 6 (six) weeks. , Disp: , Rfl:  .  loratadine (CLARITIN) 10 MG tablet, Take 10 mg by mouth daily., Disp: , Rfl:  .  Methotrexate, PF, 10 MG/0.4ML SOAJ, Inject 10 mg into the skin once a week. Friday, Disp: , Rfl:  .  Multiple Vitamin (MULTIVITAMIN) capsule, Take 1 capsule by mouth daily. , Disp: , Rfl:  .  NON FORMULARY, Place 1 spray into the nose 2 (two) times daily as needed. Compounded Lidocaine Spray.  Use as directed for headaches. [facility]), Disp: , Rfl:  .  ondansetron (ZOFRAN) 4 MG tablet, Take 1 tablet (4 mg total) by mouth every 8 (eight) hours as needed for nausea., Disp: 20 tablet, Rfl: 0 .  oxyCODONE-acetaminophen (PERCOCET/ROXICET) 5-325 MG per tablet, Take 2 tablets by mouth every 4 (four) hours as needed for severe pain., Disp: 15 tablet, Rfl: 0 .  pantoprazole (PROTONIX) 40 MG tablet, Take 40 mg by mouth daily., Disp: , Rfl:  .  pravastatin (PRAVACHOL) 20 MG tablet, Take 20 mg by mouth daily., Disp: , Rfl:  .  Probiotic Product (PROBIOTIC DAILY PO), Take 1 capsule by mouth daily., Disp: , Rfl:  .  topiramate (TOPAMAX) 50 MG tablet, TAKE ONE TABLET BY MOUTH  TWICE DAILY, Disp: 180 tablet, Rfl: 1 .  traMADol (ULTRAM) 50 MG tablet, Take 100 mg by mouth 2 (two) times daily as needed for moderate pain. , Disp: , Rfl:  Allergies  Allergen Reactions  . Amoxicillin     REACTION: GI upset  . Aspirin     REACTION: severe gi upset.  Pt states she can tolerate 81 mg asa only.   . Ciprofloxacin     REACTION: angioedema, urticaria  . Cosyntropin     REACTION: swelling, hives  . Guaifenesin     REACTION: head rash  . Moxifloxacin Other (See Comments)    gi upset  . Nsaids Other (See Comments)    Gi upset  . Sulfonamide Derivatives     REACTION: swelling, tongue and hives    Social History  Substance Use Topics  . Smoking status: Former Smoker -- 2.00 packs/day for 30 years    Types: Cigarettes  . Smokeless tobacco: Never Used     Comment: quit smoking in 2002  . Alcohol Use: No    Family History  Problem Relation Age of Onset  . Diabetes Maternal Grandmother   . Arthritis Maternal Grandmother   . Diabetes Brother   . Diabetes Paternal Grandmother   . Diabetes Maternal Grandfather   . Diabetes Paternal Grandfather   . Heart disease Brother   . Heart disease Other     Uncle  . Breast cancer Maternal Aunt   . Ovarian cancer Mother      Review of Systems  Constitutional: Negative.   HENT: Negative.   Eyes: Negative.   Respiratory: Positive for shortness of breath. Negative for cough, hemoptysis, sputum production and wheezing.   Cardiovascular: Positive for chest pain. Negative for palpitations, orthopnea, claudication, leg swelling and PND.  Gastrointestinal: Negative.   Genitourinary: Negative.   Musculoskeletal: Positive for back pain and joint pain.  Skin: Negative.   Neurological: Negative.   Endo/Heme/Allergies: Negative.   Psychiatric/Behavioral: Negative.     Objective:  Physical Exam  Constitutional: She is oriented to person, place, and time. She appears well-developed and well-nourished.  HENT:  Head:  Normocephalic and atraumatic.  Mouth/Throat: Oropharynx is clear and moist.  Eyes: Conjunctivae  are normal. Pupils are equal, round, and reactive to light.  Neck: Normal range of motion. Neck supple.  Cardiovascular: Normal rate.   Respiratory: Effort normal and breath sounds normal.  GI: Bowel sounds are normal.  Genitourinary:  Not pertinent to current symptomatology therefore not examined.  Musculoskeletal:  Examination of her right knee reveals pain medially and laterally.  Mild varus deformity.  1+ effusion.  Range of motion 0-120 degrees.  Knee is stable with normal patella tracking.  Examination of her left knee reveals pain medially and laterally.  Mild valgus deformity.  1+ effusion.  Range of motion 0-120 degrees.  Knee is stable with normal patella tracking.  Vascular exam: Pulses are 2+ and symmetric.    Neurological: She is alert and oriented to person, place, and time.  Skin: Skin is warm and dry.  Psychiatric: She has a normal mood and affect.    Vital signs in last 24 hours:    Labs:   Estimated body mass index is 31.62 kg/(m^2) as calculated from the following:   Height as of 01/25/15: 5\' 5"  (1.651 m).   Weight as of 01/25/15: 86.183 kg (190 lb).   Imaging Review Plain radiographs demonstrate severe degenerative joint disease of the right knee(s). The overall alignment issignificant varus. The bone quality appears to be good for age and reported activity level.  Assessment/Plan:  End stage arthritis, right knee   The patient history, physical examination, clinical judgment of the provider and imaging studies are consistent with end stage degenerative joint disease of the right knee(s) and total knee arthroplasty is deemed medically necessary. The treatment options including medical management, injection therapy arthroscopy and arthroplasty were discussed at length. The risks and benefits of total knee arthroplasty were presented and reviewed. The risks due to aseptic  loosening, infection, stiffness, patella tracking problems, thromboembolic complications and other imponderables were discussed. The patient acknowledged the explanation, agreed to proceed with the plan and consent was signed. Patient is being admitted for inpatient treatment for surgery, pain control, PT, OT, prophylactic antibiotics, VTE prophylaxis, progressive ambulation and ADL's and discharge planning. The patient is planning to be discharged home with home health services.  I had a long discussion with both the patient and her husband about that she has been cleared for this surgery by multiple physicians but she is at high risk for complications including infection because of her rheumatoid medications, stroke because of her previous stroke and VSD, and possibly death.  She and her understand these risks.  Post operatively we will use Eliquis for DVT and stroke  Prophylaxis.

## 2015-11-03 NOTE — Transfer of Care (Signed)
Immediate Anesthesia Transfer of Care Note  Patient: [name] A Lader  Procedure(s) Performed: Procedure(s): RIGHT TOTAL KNEE ARTHROPLASTY/RIGHT (Right)  Patient Location: PACU  Anesthesia Type:General and Regional  Level of Consciousness: awake, alert , patient cooperative and lethargic  Airway & Oxygen Therapy: Patient Spontanous Breathing and Patient connected to face mask oxygen  Post-op Assessment: Report given to RN, Post -op Vital signs reviewed and stable, Patient moving all extremities X 4 and Patient able to stick tongue midline  Post vital signs: Reviewed and stable  Last Vitals:  Filed Vitals:   11/03/15 0638  BP: 110/63  Pulse: 70  Temp: 36.8 C  Resp: 18    Complications: No apparent anesthesia complications

## 2015-11-03 NOTE — Interval H&P Note (Signed)
History and Physical Interval Note:  11/03/2015 8:17 AM  [name]  has presented today for surgery, with the diagnosis of PRIMARY LOCALIZED OA/RHEUMATOID  RIGHT KNEE  The various methods of treatment have been discussed with the patient and family. After consideration of risks, benefits and other options for treatment, the patient has consented to  Procedure(s): TOTAL KNEE ARTHROPLASTY/RIGHT (Right) as a surgical intervention .  The patient's history has been reviewed, patient examined, no change in status, stable for surgery.  I have reviewed the patient's chart and labs.  Questions were answered to the patient's satisfaction.     Salvatore Marvel A

## 2015-11-03 NOTE — Anesthesia Procedure Notes (Addendum)
Anesthesia Regional Block:  Adductor canal block  Pre-Anesthetic Checklist: ,, timeout performed, Correct Patient, Correct Site, Correct Laterality, Correct Procedure, Correct Position, site marked, Risks and benefits discussed,  Surgical consent,  Pre-op evaluation,  At surgeon's request and post-op pain management  Laterality: Right and Lower  Prep: chloraprep       Needles:   Needle Type: Echogenic Stimulator Needle     Needle Length: 9cm 9 cm Needle Gauge: 22 and 22 G  Needle insertion depth: 7 cm   Additional Needles:  Procedures: ultrasound guided (picture in chart) Adductor canal block Narrative:  Start time: 11/03/2015 7:55 AM End time: 11/03/2015 8:10 AM Injection made incrementally with aspirations every 5 mL.  Performed by: Personally  Anesthesiologist: MASSAGEE, TERRY  Additional Notes: Tolerated well   Procedure Name: Intubation Date/Time: 11/03/2015 8:49 AM Performed by: Salomon Mast Pre-anesthesia Checklist: Patient identified, Emergency Drugs available, Suction available and Patient being monitored Patient Re-evaluated:Patient Re-evaluated prior to inductionOxygen Delivery Method: Circle system utilized Preoxygenation: Pre-oxygenation with 100% oxygen Intubation Type: IV induction Ventilation: Mask ventilation without difficulty Grade View: Grade I Tube type: Oral Number of attempts: 1 Airway Equipment and Method: Stylet and Video-laryngoscopy Placement Confirmation: ETT inserted through vocal cords under direct vision,  positive ETCO2 and breath sounds checked- equal and bilateral Secured at: 24 cm Dental Injury: Teeth and Oropharynx as per pre-operative assessment  Difficulty Due To: Difficult Airway- due to limited oral opening

## 2015-11-03 NOTE — Progress Notes (Signed)
Orthopedic Tech Progress Note Patient Details:  [name] 09-25-1961 [id number] Off cpm at 1845 Patient ID: [name], female   DOB: [birth year], 55 y.o.   MRN: [id number]   [name] 11/03/2015, 6:44 PM

## 2015-11-03 NOTE — Progress Notes (Signed)
Orthopedic Tech Progress Note Patient Details:  [name] December 27, 1960 [id number] Bone foam will be provided when ortho gets a supply Patient ID: [name], female   DOB: Jun 04, 1961, 54 y.o.   MRN: [id number]   Tawni Carnes [facility] 11/03/2015, 12:10 PM

## 2015-11-03 NOTE — Op Note (Signed)
[name], [name]               ACCOUNT NO.:  [id number]  MEDICAL RECORD NO.:  [id number]  LOCATION:  [id number]                        FACILITY:  [facility]  PHYSICIAN:  Alexsis Kathman A. Thurston Hole, M.D. DATE OF BIRTH:  25-Jul-1961  DATE OF PROCEDURE:  11/03/2015 DATE OF DISCHARGE:                              OPERATIVE REPORT   PREOPERATIVE DIAGNOSIS:  Right knee primary localized osteoarthritis.  POSTOPERATIVE DIAGNOSES:  Right knee primary localized osteoarthritis with rheumatoid arthritis.  PROCEDURE: 1. Right total knee replacement using [employer]hew Oxinium total     knee replacement with #6 at femur, #5 cemented tibia with an 11-mm     polyethylene tibial spacer, and 32-mm polyethylene cemented     patella. 2. Zinacef impregnated cement.  SURGEON:  Elana Alm. Thurston Hole, M.D.  ASSISTANT:  Julien Girt, PA.  ANESTHESIA:  General.  OPERATIVE TIME:  1 hour and 20 minutes.  COMPLICATIONS:  None.  DESCRIPTION OF PROCEDURE:  [name] was brought to the operating room on November 03, 2015, after a femoral nerve block had been placed in the holding room by Anesthesia.  She was placed on the operative table in supine position.  She received antibiotics preoperatively for prophylaxis.  After being placed under general anesthesia, her right knee was examined.  She had range of motion from -5 to 135 degrees, moderate varus deformity, knee stable to ligamentous exam with normal patellar tracking.  She had a Foley catheter placed under sterile conditions.  She received antibiotics preoperatively for prophylaxis. Her right leg was then prepped using sterile DuraPrep and draped using sterile technique.  A time-out procedure was called and the correct right knee identified.  Right leg was exsanguinated and a tourniquet elevated to 350 mm.  Initially, through an 8-10 cm longitudinal incision, initial exposure was made.  The underlying subcutaneous tissues were incised along with skin  incision.  A median arthrotomy was performed revealing an excessive amount of normal-appearing joint fluid. The articular surfaces were inspected.  She had grade 4 changes medially, laterally, and in the patellofemoral joint.  The medial and lateral meniscal remnants were removed as well as the anterior cruciate and posterior cruciate ligaments.  An intramedullary drill was then drilled up the femoral canal for placement of distal femoral cutting jig, which was placed in the appropriate manner of rotation and a distal 9.5 mm cut made.  Proximal tibia was then exposed.  The proximal tibial cutting jig was placed in the appropriate manner of rotation and then a proximal tibial cut was made taking 4 mm off the medial or lower side and 8 mm off the lateral side.  After this was done, then the knee was placed in full extension and the spacer block tested and with an 11 block there was excellent balancing.  At this point, the distal femur was sized for a #6 size, and the distal femoral cutting jig was placed in the appropriate manner of external rotation and then these cuts were made.  Proximal tibia was then exposed and a #5 tibial base plate was placed with excellent fit and then a keel cut was made.  At this point, with the distal femoral box cutter and a  trial in place, the central box cutter jig was used to make this cut.  At this point, with the femoral and tibial trials in place and the 11-mm tibial spacer, the knee was reduced, taken through a full range of motion and found to be stable with excellent correction of her flexion and varus deformities and normal patellar tracking.  An 8-mm resurfacing patellar cut was then made and 3 locking holes placed for a 32-mm polyethylene patellar trial and again patellofemoral tracking was evaluated and found to be normal. At this point, it was felt that all the trial components were of excellent size, fit, and stability.  They were then removed and  then the knee was jet lavage irrigated with 3 L of saline.  The proximal tibia was then exposed and a #5 tibial baseplate with cement backing was hammered into position with an excellent fit with excess cement being removed from around the edges.  The #6 femoral component with cement backing was hammered into position also with an excellent fit with excess cement being removed from around the edges.  The 11-mm polyethylene tibial spacer was then locked on the tibial baseplate and the knee reduced, taken through a full range of motion with excellent stability.  The 32-mm polyethylene cement backed patella was then placed in position and held there with a clamp.  After the cement hardened, again patellofemoral tracking was noted to be normal.  At this point, it was felt that all the components were of excellent size, fit, and stability.  The wound was further irrigated and then the arthrotomy was closed with #1 Vicryl suture.  Subcutaneous tissues closed with 2-0 Vicryl, subcuticular layer closed with 4-0 Monocryl, the knee injected with 0.25% Marcaine with epinephrine.  Sterile dressings were applied, the tourniquet was released, and the patient awakened, taken to recovery room in stable condition.  Needle and sponge counts were correct x2 at the end of the case.     Sharnetta Gielow A. Thurston Hole, M.D.     RAW/MEDQ  D:  11/03/2015  T:  11/03/2015  Job:  [id number]

## 2015-11-03 NOTE — Progress Notes (Signed)
Utilization review completed.  

## 2015-11-03 NOTE — Evaluation (Signed)
Physical Therapy Evaluation Patient Details Name: DESHAE DICKISON MRN: [id number] DOB: 1960/11/27 Today's Date: 11/03/2015   History of Present Illness  Admitted for RTKA;  has a past medical history of Plaque psoriasis; Cerebral vascular malformation; Anxiety; Asthma; Depression; Rheumatoid arthritis(714.0) (2010); Nausea; PONV (postoperative nausea and vomiting); History of bronchitis as a child; History of migraine; Stroke (HCC) (03/26/2013); Fibromyalgia;  History of staph infection (14yrs ago); Ventricular septal defect; Sleep apnea; PFO (patent foramen ovale); Seizures (HCC) (40months ago 03/21/14); and Primary localized osteoarthritis of right knee (11/03/2015).   Clinical Impression   Pt is s/p TKA resulting in the deficits listed below (see PT Problem List).  Pt will benefit from skilled PT to increase their independence and safety with mobility to allow discharge to the venue listed below.      Follow Up Recommendations Home health PT;Supervision/Assistance - 24 hour    Equipment Recommendations  Rolling walker with 5" wheels;3in1 (PT)    Recommendations for Other Services OT consult     Precautions / Restrictions Precautions Precautions: Knee;Fall Precaution Booklet Issued: Yes (comment) Required Braces or Orthoses: Knee Immobilizer - Right Knee Immobilizer - Right: On when out of bed or walking Restrictions Weight Bearing Restrictions: Yes RLE Weight Bearing: Weight bearing as tolerated  Pt educated to not allow any pillow or bolster under knee for healing with optimal range of motion.      Mobility  Bed Mobility Overal bed mobility: Needs Assistance Bed Mobility: Supine to Sit     Supine to sit: Mod assist     General bed mobility comments: Cues for technqiue; mod assist to square off hips at EOB  Transfers Overall transfer level: Needs assistance Equipment used: Rolling walker (2 wheeled) Transfers: Sit to/from Stand Sit to Stand: Min assist          General transfer comment: Cues for safety and hand placement  Ambulation/Gait Ambulation/Gait assistance: Min assist Ambulation Distance (Feet):  (pivotal steps bed to chair) Assistive device: Rolling walker (2 wheeled) Gait Pattern/deviations: Step-to pattern     General Gait Details: Tending to keep weight off of RLE; cues for gait sequence; moving a bit impulsively  Stairs            Wheelchair Mobility    Modified Rankin (Stroke Patients Only)       Balance                                             Pertinent Vitals/Pain Pain Assessment: 0-10 Pain Score: 8  Pain Location: R knee Pain Descriptors / Indicators: Aching;Grimacing;Guarding Pain Intervention(s): Limited activity within patient's tolerance;Monitored during session;Premedicated before session;Repositioned    Home Living Family/patient expects to be discharged to:: Private residence Living Arrangements: Spouse/significant other Available Help at Discharge: Family;Available 24 hours/day Type of Home: House Home Access: Stairs to enter   [employer] of Steps: 3 (2+1) Home Layout: One level        Prior Function Level of Independence: Independent               Hand Dominance   Dominant Hand: Right    Extremity/Trunk Assessment   Upper Extremity Assessment: Defer to OT evaluation           Lower Extremity Assessment: RLE deficits/detail RLE Deficits / Details: Grossly decr AROM and strength, limited by pain postop       Communication  Communication: No difficulties  Cognition Arousal/Alertness: Awake/alert Behavior During Therapy: WFL for tasks assessed/performed Overall Cognitive Status: Within Functional Limits for tasks assessed                      General Comments General comments (skin integrity, edema, etc.): exercises held due to pain    Exercises        Assessment/Plan    PT Assessment Patient needs continued PT  services  PT Diagnosis Difficulty walking;Acute pain   PT Problem List Decreased strength;Decreased range of motion;Decreased activity tolerance;Decreased balance;Decreased mobility;Decreased knowledge of use of DME;Decreased safety awareness;Decreased knowledge of precautions;Pain  PT Treatment Interventions DME instruction;Gait training;Stair training;Functional mobility training;Therapeutic activities;Therapeutic exercise;Patient/family education   PT Goals (Current goals can be found in the Care Plan section) Acute Rehab PT Goals Patient Stated Goal: did not state PT Goal Formulation: With patient Time For Goal Achievement: 11/10/15 Potential to Achieve Goals: Good    Frequency 7X/week   Barriers to discharge        Co-evaluation               End of Session Equipment Utilized During Treatment: Gait belt;Right knee immobilizer Activity Tolerance: Patient tolerated treatment well Patient left: in chair;with call bell/phone within reach Nurse Communication: Mobility status         Time: 9323-5573 PT Time Calculation (min) (ACUTE ONLY): 20 min   Charges:   PT Evaluation $PT Eval Moderate Complexity: 1 Procedure     PT G CodesOlen Pel 11/03/2015, 4:20 PM  Van Clines, PT  Acute Rehabilitation Services Pager [phone] Office [phone]

## 2015-11-03 NOTE — Progress Notes (Signed)
CPAP set up in pt's room with HR pressure of 10cmH20. Pt wants to use her own mask, but mask will not fit our tubing adapter. RT explained to pt that the mask wouldn't fit the current adapter, but I would look to see if we had one it would fit. RT will check back.

## 2015-11-03 NOTE — Anesthesia Preprocedure Evaluation (Addendum)
Anesthesia Evaluation  Patient identified by MRN, date of birth, ID band Patient awake    History of Anesthesia Complications (+) PONV and history of anesthetic complications  Airway Mallampati: III   Neck ROM: Full  Mouth opening: Limited Mouth Opening  Dental  (+) Teeth Intact, Missing   Pulmonary shortness of breath, asthma , sleep apnea , former smoker,    breath sounds clear to auscultation       Cardiovascular + Peripheral Vascular Disease   Rhythm:Regular Rate:Normal  Known PFO by ECHO report   Neuro/Psych  Headaches, Seizures -,   Neuromuscular disease CVA    GI/Hepatic GERD  ,  Endo/Other    Renal/GU      Musculoskeletal  (+) Arthritis , Fibromyalgia -  Abdominal   Peds  Hematology   Anesthesia Other Findings   Reproductive/Obstetrics                           Anesthesia Physical Anesthesia Plan  ASA: III  Anesthesia Plan: General   Post-op Pain Management: GA combined w/ Regional for post-op pain   Induction: Intravenous  Airway Management Planned: LMA and Oral ETT  Additional Equipment:   Intra-op Plan:   Post-operative Plan: Extubation in OR  Informed Consent: I have reviewed the patients History and Physical, chart, labs and discussed the procedure including the risks, benefits and alternatives for the proposed anesthesia with the patient or authorized representative who has indicated his/her understanding and acceptance.     Plan Discussed with:   Anesthesia Plan Comments:        Anesthesia Quick Evaluation

## 2015-11-04 ENCOUNTER — Encounter (HOSPITAL_COMMUNITY): Payer: Self-pay | Admitting: Orthopedic Surgery

## 2015-11-04 LAB — CBC
HCT: 32 % — ABNORMAL LOW (ref 36.0–46.0)
HEMOGLOBIN: 10.9 g/dL — AB (ref 12.0–15.0)
MCH: 35 pg — AB (ref 26.0–34.0)
MCHC: 34.1 g/dL (ref 30.0–36.0)
MCV: 102.9 fL — AB (ref 78.0–100.0)
PLATELETS: 175 10*3/uL (ref 150–400)
RBC: 3.11 MIL/uL — ABNORMAL LOW (ref 3.87–5.11)
RDW: 12.6 % (ref 11.5–15.5)
WBC: 13.9 10*3/uL — ABNORMAL HIGH (ref 4.0–10.5)

## 2015-11-04 LAB — BASIC METABOLIC PANEL
Anion gap: 7 (ref 5–15)
BUN: 7 mg/dL (ref 6–20)
CHLORIDE: 111 mmol/L (ref 101–111)
CO2: 21 mmol/L — ABNORMAL LOW (ref 22–32)
CREATININE: 0.58 mg/dL (ref 0.44–1.00)
Calcium: 8.5 mg/dL — ABNORMAL LOW (ref 8.9–10.3)
GFR calc Af Amer: >60 mL/min (ref >60)
GFR calc non Af Amer: >60 mL/min (ref >60)
GLUCOSE: 148 mg/dL — AB (ref 65–99)
POTASSIUM: 3.8 mmol/L (ref 3.5–5.1)
Sodium: 139 mmol/L (ref 135–145)

## 2015-11-04 NOTE — Evaluation (Addendum)
Occupational Therapy Evaluation Patient Details Name: [name] MRN: [id number] DOB: [birth year] Today's Date: 11/04/2015    History of Present Illness Admitted for RTKA;  has a past medical history of Plaque psoriasis; Cerebral vascular malformation; Anxiety; Asthma; Depression; Rheumatoid arthritis(714.0) (2010); Nausea; PONV (postoperative nausea and vomiting); History of bronchitis as a child; History of migraine; Stroke (HCC) (03/26/2013); Fibromyalgia;  History of staph infection (67yrs ago); Ventricular septal defect; Sleep apnea; PFO (patent foramen ovale); Seizures (HCC) (10months ago 03/21/14); and Primary localized osteoarthritis of right knee (11/03/2015).    Clinical Impression   Pt reports she required min assist with socks and buttons, otherwise was independent with ADLs PTA. Pt used SPC for all mobility PTA. Pt currently min assist overall for functional mobility and ADLs. Began safety and ADL education with pt and husband; they verbalize understanding. Pt planning to d/c home with 24/7 supervision from her husband. Recommending HHOT with 24/7 supervision to maximize independence and safety with ADLs and functional mobility upon return home. Pt would benefit from continued skilled OT to increase independence and safety with LB ADLs, toilet and walk in shower transfers.    Follow Up Recommendations  Home health OT;Supervision/Assistance - 24 hour    Equipment Recommendations  3 in 1 bedside comode    Recommendations for Other Services       Precautions / Restrictions Precautions Precautions: Knee;Fall Restrictions Weight Bearing Restrictions: Yes RLE Weight Bearing: Weight bearing as tolerated      Mobility Bed Mobility               General bed mobility comments: Pt OOB in chair.  Transfers Overall transfer level: Needs assistance Equipment used: Rolling walker (2 wheeled) Transfers: Sit to/from Stand Sit to Stand: Min assist         General transfer  comment: Min assist to boost up from chair. VCs for hand placement.    Balance Overall balance assessment: Needs assistance Sitting-balance support: Feet supported Sitting balance-Leahy Scale: Fair     Standing balance support: Bilateral upper extremity supported Standing balance-Leahy Scale: Poor Standing balance comment: RW for support                            ADL Overall ADL's : Needs assistance/impaired Eating/Feeding: Set up;Sitting   Grooming: Set up;Sitting       Lower Body Bathing: Minimal assistance;Sit to/from stand       Lower Body Dressing: Minimal assistance;Sit to/from stand Lower Body Dressing Details (indicate cue type and reason): Pt able to reach bilateral feet but anticipate difficulty with starting socks/pants over RLE. Pt reports husband can assist with ADLs as needed. Educated on compensatory strategies for LB ADLs. Toilet Transfer: Minimal assistance;Ambulation;BSC;RW (BSC over toilet)           Functional mobility during ADLs: Minimal assistance;Rolling walker General ADL Comments: Pts husband present for OT eval. Pt has history of stroke with minimal cognitive deficits. Pt had just finished walking with PA-C prior to OT session and was fatigued and in a lot of pain despite premedication; limited functional mobility. Educated on home safety, need for supervision during ADLs and functional mobility, ice for edema and pain.      Vision     Perception     Praxis      Pertinent Vitals/Pain Pain Assessment: Faces Faces Pain Scale: Hurts whole lot Pain Location: R knee Pain Descriptors / Indicators: Aching;Grimacing;Guarding;Moaning Pain Intervention(s): Limited activity within patient's tolerance;Monitored  during session;Ice applied;Premedicated before session     Hand Dominance Right   Extremity/Trunk Assessment Upper Extremity Assessment Upper Extremity Assessment: Overall WFL for tasks assessed   Lower Extremity  Assessment Lower Extremity Assessment: Defer to PT evaluation   Cervical / Trunk Assessment Cervical / Trunk Assessment: Normal   Communication Communication Communication: HOH   Cognition Arousal/Alertness: Awake/alert Behavior During Therapy: WFL for tasks assessed/performed Overall Cognitive Status: History of cognitive impairments - at baseline                     General Comments       Exercises       Shoulder Instructions      Home Living Family/patient expects to be discharged to:: Private residence Living Arrangements: Spouse/significant other Available Help at Discharge: Family;Available 24 hours/day Type of Home: House Home Access: Stairs to enter [employer] of Steps: 3   Home Layout: One level     Bathroom Shower/Tub: Producer, television/film/video: Standard Bathroom Accessibility: Yes How Accessible: Accessible via walker Home Equipment: Walker - 2 wheels;Cane - single point;Shower seat          Prior Functioning/Environment Level of Independence: Needs assistance  Gait / Transfers Assistance Needed: Used SPC for all mobility ADL's / Homemaking Assistance Needed: Husband reports he assisted occasionally with socks and buttons        OT Diagnosis: Generalized weakness;Cognitive deficits;Acute pain   OT Problem List: Decreased strength;Decreased activity tolerance;Decreased range of motion;Impaired balance (sitting and/or standing);Decreased cognition;Decreased safety awareness;Decreased knowledge of use of DME or AE;Decreased knowledge of precautions;Pain   OT Treatment/Interventions: Self-care/ADL training;Energy conservation;DME and/or AE instruction;Patient/family education    OT Goals(Current goals can be found in the care plan section) Acute Rehab OT Goals Patient Stated Goal: did not state OT Goal Formulation: With patient Time For Goal Achievement: 11/18/15 Potential to Achieve Goals: Good ADL Goals Pt Will Perform  Grooming: with supervision;standing Pt Will Perform Lower Body Bathing: with supervision;sit to/from stand Pt Will Perform Lower Body Dressing: with supervision;sit to/from stand Pt Will Transfer to Toilet: with supervision;ambulating;bedside commode (over toilet) Pt Will Perform Toileting - Clothing Manipulation and hygiene: with supervision;sit to/from stand Pt Will Perform Tub/Shower Transfer: Shower transfer;with supervision;ambulating;shower seat;rolling walker  OT Frequency: Min 2X/week   Barriers to D/C:            Co-evaluation              End of Session Equipment Utilized During Treatment: Gait belt;Rolling walker CPM Right Knee CPM Right Knee: Off Right Knee Flexion (Degrees): 80 Nurse Communication: Other (comment) (pt has questions about diet)  Activity Tolerance: Patient limited by fatigue;Patient limited by pain Patient left: in chair;with call bell/phone within reach;with family/visitor present   Time: 8546-2703 OT Time Calculation (min): 17 min Charges:  OT General Charges $OT Visit: 1 Procedure OT Evaluation $OT Eval Moderate Complexity: 1 Procedure G-Codes:     Gaye Alken M.S., OTR/L Pager: [phone]  11/04/2015, 10:52 AM

## 2015-11-04 NOTE — Progress Notes (Signed)
Physical Therapy Treatment Patient Details Name: [name] MRN: [id number] DOB: [birth year] Today's Date: 11/04/2015    History of Present Illness Admitted for RTKA;  has a past medical history of Plaque psoriasis; Cerebral vascular malformation; Anxiety; Asthma; Depression; Rheumatoid arthritis(714.0) (2010); Nausea; PONV (postoperative nausea and vomiting); History of bronchitis as a child; History of migraine; Stroke (HCC) (03/26/2013); Fibromyalgia;  History of staph infection (90yrs ago); Ventricular septal defect; Sleep apnea; PFO (patent foramen ovale); Seizures (HCC) (33months ago 03/21/14); and Primary localized osteoarthritis of right knee (11/03/2015).     PT Comments    Patient making improvements with gait today.  Pain limiting session.  Follow Up Recommendations  Home health PT;Supervision/Assistance - 24 hour     Equipment Recommendations  Rolling walker with 5" wheels;3in1 (PT)    Recommendations for Other Services       Precautions / Restrictions Precautions Precautions: Knee;Fall Precaution Comments: Reviewed precautions with patient. Required Braces or Orthoses: Knee Immobilizer - Right Knee Immobilizer - Right: On when out of bed or walking Restrictions Weight Bearing Restrictions: Yes RLE Weight Bearing: Weight bearing as tolerated    Mobility  Bed Mobility Overal bed mobility: Needs Assistance Bed Mobility: Supine to Sit;Sit to Supine     Supine to sit: Min assist Sit to supine: Min assist   General bed mobility comments: Verbal cues to move RLE off of CPM and scoot hips/trunk over in bed.  Patient with difficulty processing/sequencing , requiring increased time.  Once off of CPM and supine, patient able to move to sitting with min assist for RLE.  Assist to bring RLE onto bed to return to supine.  Transfers Overall transfer level: Needs assistance Equipment used: Rolling walker (2 wheeled) Transfers: Sit to/from Stand Sit to Stand: Min assist          General transfer comment: Verbal cues for hand placement.  Assist to rise to standing from bed and 3-in-1.  Ambulation/Gait Ambulation/Gait assistance: Min assist Ambulation Distance (Feet): 30 Feet Assistive device: Rolling walker (2 wheeled) Gait Pattern/deviations: Step-to pattern;Decreased stance time - right;Decreased step length - left;Decreased step length - right;Decreased stride length;Decreased weight shift to right;Antalgic Gait velocity: decreased Gait velocity interpretation: Below normal speed for age/gender General Gait Details: Verbal cues for use of RW and to keep RLE on floor.  Initially patient attempting to ambulate NWB on RLE.  After verbal and tactile cues, patient able to bear weight through RLE making gait more safe.  Assist to maneuver RW at times during turns.   Stairs            Wheelchair Mobility    Modified Rankin (Stroke Patients Only)       Balance                                    Cognition Arousal/Alertness: Awake/alert Behavior During Therapy: WFL for tasks assessed/performed Overall Cognitive Status: History of cognitive impairments - at baseline                      Exercises      General Comments        Pertinent Vitals/Pain Pain Assessment: 0-10 Pain Score: 10-Worst pain ever Pain Location: Rt knee Pain Descriptors / Indicators: Sore ("Hard") Pain Intervention(s): Monitored during session;Premedicated before session;Repositioned    Home Living  Prior Function            PT Goals (current goals can now be found in the care plan section) Progress towards PT goals: Progressing toward goals    Frequency  7X/week    PT Plan Current plan remains appropriate    Co-evaluation             End of Session Equipment Utilized During Treatment: Gait belt;Right knee immobilizer Activity Tolerance: Patient limited by pain;Patient limited by fatigue Patient  left: in bed;in CPM;with call bell/phone within reach;with family/visitor present (Increased CPM from 20* to 30* flexion)     Time: 4765-4650 PT Time Calculation (min) (ACUTE ONLY): 24 min  Charges:  $Gait Training: 23-37 mins                    G Codes:      Vena Austria November 15, 2015, 4:23 PM Durenda Hurt. Renaldo Fiddler, [facility] Acute Rehab Services Pager [phone]

## 2015-11-04 NOTE — Progress Notes (Signed)
PT Cancellation Note  Patient Details Name: [name] MRN: [id number] DOB: 1961-05-31   Cancelled Treatment:    Reason Eval/Treat Not Completed: Pain limiting ability to participate.  Unable to get second PT session in today due to patient c/o increased pain.  Will continue in am.   Vena Austria 11/04/2015, 5:32 PM Durenda Hurt. Renaldo Fiddler, [facility] Acute Rehab Services Pager [phone]

## 2015-11-04 NOTE — Care Management Note (Signed)
Case Management Note  Patient Details  Name: [name] MRN: [id number] Date of Birth: 1961/05/25  Subjective/Objective:        S/p right total knee arthroplasty            Action/Plan: Spoke with patient and husband about discharge plan, she selected [facility]. Contacted T[facility] at [facility] and set up HHPT. [employer] will deliver rolling walker and 3N1 to patient's room in am and CPM to patient's home.Patient stated that her husband will be able to assist after discharge.   Expected Discharge Date:                  Expected Discharge Plan:  Home w Home Health Services  In-House Referral:  NA  Discharge planning Services  CM Consult  Post Acute Care Choice:  Durable Medical Equipment, Home Health Choice offered to:  Patient  DME Arranged:  3-N-1, CPM, Walker rolling DME Agency:  [employer]  HH Arranged:  PT HH Agency:  [facility]  Status of Service:  Completed, signed off  Medicare Important Message Given:    Date Medicare IM Given:    Medicare IM give by:    Date Additional Medicare IM Given:    Additional Medicare Important Message give by:     If discussed at Long Length of Stay Meetings, dates discussed:    Additional Comments:  Monica Becton, RN 11/04/2015, 11:18 AM

## 2015-11-04 NOTE — Progress Notes (Signed)
Subjective: 1 Day Post-Op Procedure(s) (LRB): RIGHT TOTAL KNEE ARTHROPLASTY/RIGHT (Right) Patient reports pain as 3 on 0-10 scale and 8 on 0-10 scale.    Objective: Vital signs in last 24 hours: Temp:  [97.5 F (36.4 C)-98.4 F (36.9 C)] 98.1 F (36.7 C) (01/19 0500) Pulse Rate:  [71-98] 75 (01/19 0500) Resp:  [9-26] 19 (01/19 0500) BP: (97-128)/(48-78) 125/78 mmHg (01/19 0500) SpO2:  [93 %-100 %] 96 % (01/19 0500)  Intake/Output from previous day: 01/18 0701 - 01/19 0700 In: 3845 [P.O.:240; I.V.:3505; IV Piggyback:100] Out: 1310 [Urine:1300; Blood:10] Intake/Output this shift: Total I/O In: 240 [P.O.:240] Out: -    Recent Labs  11/03/15 1505 11/04/15 0635  HGB 13.3 10.9*    Recent Labs  11/03/15 1505 11/04/15 0635  WBC 12.6* 13.9*  RBC 3.83* 3.11*  HCT 39.7 32.0*  PLT 161 175    Recent Labs  11/03/15 1505 11/04/15 0635  NA  --  139  K  --  3.8  CL  --  111  CO2  --  21*  BUN  --  7  CREATININE 0.64 0.58  GLUCOSE  --  148*  CALCIUM  --  8.5*   No results for input(s): LABPT, INR in the last 72 hours.  ABD soft Neurovascular intact Sensation intact distally Intact pulses distally Dorsiflexion/Plantar flexion intact Incision: moderate drainage  This patient has difficulty processing instructions.  He husband says this is normal and is from her stroke.  Assessment/Plan: 1 Day Post-Op Procedure(s) (LRB): RIGHT TOTAL KNEE ARTHROPLASTY/RIGHT (Right)  Principal Problem:   Primary localized osteoarthritis of right knee Active Problems:   Obstructive sleep apnea   CVA (cerebral infarction) history   QT prolongation   Patent foramen ovale   Panic anxiety syndrome   Complex partial epilepsy (HCC)   Rheumatoid arthritis (HCC)   Seizures (HCC)   Ventricular septal defect   History of staph infection   DJD (degenerative joint disease) of knee  [facility]vance diet Up with therapy  Shekera Beavers J 11/04/2015, 10:39 AM

## 2015-11-04 NOTE — Anesthesia Postprocedure Evaluation (Signed)
Anesthesia Post Note  Patient: [name]  Procedure(s) Performed: Procedure(s) (LRB): RIGHT TOTAL KNEE ARTHROPLASTY/RIGHT (Right)  Patient location during evaluation: PACU Anesthesia Type: General Level of consciousness: awake and alert Pain management: pain level controlled Vital Signs Assessment: post-procedure vital signs reviewed and stable Respiratory status: spontaneous breathing, nonlabored ventilation, respiratory function stable and patient connected to nasal cannula oxygen Cardiovascular status: blood pressure returned to baseline and stable Postop Assessment: no signs of nausea or vomiting Anesthetic complications: no    Last Vitals:  Filed Vitals:   11/04/15 0124 11/04/15 0500  BP:  125/78  Pulse: 76 75  Temp:  36.7 C  Resp: 20 19    Last Pain:  Filed Vitals:   11/04/15 1219  PainSc: 8                  Cecile Hearing

## 2015-11-05 LAB — CBC
HCT: 31 % — ABNORMAL LOW (ref 36.0–46.0)
Hemoglobin: 10.3 g/dL — ABNORMAL LOW (ref 12.0–15.0)
MCH: 34.3 pg — ABNORMAL HIGH (ref 26.0–34.0)
MCHC: 33.2 g/dL (ref 30.0–36.0)
MCV: 103.3 fL — ABNORMAL HIGH (ref 78.0–100.0)
PLATELETS: 233 10*3/uL (ref 150–400)
RBC: 3 MIL/uL — ABNORMAL LOW (ref 3.87–5.11)
RDW: 12.9 % (ref 11.5–15.5)
WBC: 15.7 10*3/uL — AB (ref 4.0–10.5)

## 2015-11-05 LAB — BASIC METABOLIC PANEL
ANION GAP: 9 (ref 5–15)
BUN: 8 mg/dL (ref 6–20)
CALCIUM: 8.7 mg/dL — AB (ref 8.9–10.3)
CO2: 24 mmol/L (ref 22–32)
CREATININE: 0.51 mg/dL (ref 0.44–1.00)
Chloride: 106 mmol/L (ref 101–111)
GLUCOSE: 109 mg/dL — AB (ref 65–99)
Potassium: 3.9 mmol/L (ref 3.5–5.1)
Sodium: 139 mmol/L (ref 135–145)

## 2015-11-05 MED ORDER — OXYCODONE HCL 5 MG PO TABS: ORAL_TABLET | ORAL | Status: DC

## 2015-11-05 MED ORDER — POLYETHYLENE GLYCOL 3350 17 G PO PACK: PACK | ORAL | Status: DC

## 2015-11-05 MED ORDER — ENOXAPARIN SODIUM 30 MG/0.3ML ~~LOC~~ SOLN: 30.0000 mg | Freq: Two times a day (BID) | SUBCUTANEOUS | Status: DC

## 2015-11-05 MED ORDER — OXYCODONE HCL ER 20 MG PO T12A: EXTENDED_RELEASE_TABLET | ORAL | Status: DC

## 2015-11-05 MED ORDER — METOCLOPRAMIDE HCL 5 MG PO TABS: 5.0000 mg | ORAL_TABLET | Freq: Three times a day (TID) | ORAL | Status: DC | PRN

## 2015-11-05 MED ORDER — DOCUSATE SODIUM 100 MG PO CAPS: ORAL_CAPSULE | ORAL | Status: DC

## 2015-11-05 NOTE — Progress Notes (Signed)
Occupational Therapy Treatment Patient Details Name: [name] MRN: [id number] DOB: Sep 30, 1961 Today's Date: 11/05/2015    History of present illness Admitted for RTKA;  has a past medical history of Plaque psoriasis; Cerebral vascular malformation; Anxiety; Asthma; Depression; Rheumatoid arthritis(714.0) (2010); Nausea; PONV (postoperative nausea and vomiting); History of bronchitis as a child; History of migraine; Stroke (HCC) (03/26/2013); Fibromyalgia;  History of staph infection (34yrs ago); Ventricular septal defect; Sleep apnea; PFO (patent foramen ovale); Seizures (HCC) (61months ago 03/21/14); and Primary localized osteoarthritis of right knee (11/03/2015).    OT comments  Pt making slow progress toward OT goals; limited by pain and fatigue. Pt able to perform toilet transfer with min assist throughout. Min guard for toilet hygiene in sitting and min assist for grooming in standing. Pt noted to have R knee buckling x 4 with functional mobility to bathroom. D/c plan remains appropriate at this time. Will continue to follow acutely.    Follow Up Recommendations  Home health OT;Supervision/Assistance - 24 hour    Equipment Recommendations  3 in 1 bedside comode    Recommendations for Other Services      Precautions / Restrictions Precautions Precautions: Knee;Fall Precaution Comments: Reviewed precautions with patient. Required Braces or Orthoses: Knee Immobilizer - Right Knee Immobilizer - Right: On when out of bed or walking Restrictions Weight Bearing Restrictions: Yes RLE Weight Bearing: Weight bearing as tolerated       Mobility Bed Mobility               General bed mobility comments: Pt OOB in chair  Transfers Overall transfer level: Needs assistance Equipment used: Rolling walker (2 wheeled) Transfers: Sit to/from Stand Sit to Stand: Min assist         General transfer comment: VCs for hand placement and technique. Assist to boost up from chair and  BSC    Balance Overall balance assessment: Needs assistance         Standing balance support: No upper extremity supported;During functional activity Standing balance-Leahy Scale: Poor Standing balance comment: Required min assist for balance in standing.                   ADL Overall ADL's : Needs assistance/impaired     Grooming: Minimal assistance;Standing;Wash/dry hands Grooming Details (indicate cue type and reason): Min assist for balance in standing                 Toilet Transfer: Minimal assistance;Ambulation;BSC;RW;Cueing for sequencing (BSC over toilet) Toilet Transfer Details (indicate cue type and reason): Min assist for sit to stand and balance with functional mobility. Pt noted to have R knee buckling x 4 with functional mobility to bathroom. Increased time required throughout. Toileting- Architect and Hygiene: Min guard;Sitting/lateral lean (for toilet hygiene)       Functional mobility during ADLs: Minimal assistance;Rolling walker General ADL Comments: No family present for OT session. Educated on safety wtih RW and home safety.       Vision                     Perception     Praxis      Cognition   Behavior During Therapy: Flat affect Overall Cognitive Status: History of cognitive impairments - at baseline                       Extremity/Trunk Assessment  Exercises     Shoulder Instructions       General Comments      Pertinent Vitals/ Pain       Pain Assessment: 0-10 Pain Score: 8  Pain Location: R knee Pain Descriptors / Indicators: Grimacing;Guarding;Sore Pain Intervention(s): Limited activity within patient's tolerance;Monitored during session  Home Living                                          Prior Functioning/Environment              Frequency Min 2X/week     Progress Toward Goals  OT Goals(current goals can now be found in the care plan  section)  Progress towards OT goals: Progressing toward goals  Acute Rehab OT Goals Patient Stated Goal: none stated OT Goal Formulation: With patient/family  Plan Discharge plan remains appropriate    Co-evaluation                 End of Session Equipment Utilized During Treatment: Gait belt;Rolling walker;Right knee immobilizer   Activity Tolerance Patient limited by pain;Patient limited by fatigue   Patient Left in chair;with call bell/phone within reach   Nurse Communication Other (comment) (pt with nausea)        Time: 0626-9485 OT Time Calculation (min): 24 min  Charges: OT General Charges $OT Visit: 1 Procedure OT Treatments $Self Care/Home Management : 23-37 mins  Gaye Alken M.S., OTR/L Pager: [phone]  11/05/2015, 10:42 AM

## 2015-11-05 NOTE — Progress Notes (Signed)
Physical Therapy Progress Note 11/05/15  Clinical Impression:  Patient able to complete exercises with assist.  Instructed patient and husband on donning/doffing KI on RLE.  Patient ready for d/c from PT perspective.   11/05/15 1328  PT Visit Information  Last PT Received On 11/05/15  Assistance Needed +1  History of Present Illness Admitted for RTKA;  has a past medical history of Plaque psoriasis; Cerebral vascular malformation; Anxiety; Asthma; Depression; Rheumatoid arthritis(714.0) (2010); Nausea; PONV (postoperative nausea and vomiting); History of bronchitis as a child; History of migraine; Stroke (HCC) (03/26/2013); Fibromyalgia;  History of staph infection (26yrs ago); Ventricular septal defect; Sleep apnea; PFO (patent foramen ovale); Seizures (HCC) (63months ago 03/21/14); and Primary localized osteoarthritis of right knee (11/03/2015).   PT Time Calculation  PT Start Time (ACUTE ONLY) 1312  PT Stop Time (ACUTE ONLY) 1322  PT Time Calculation (min) (ACUTE ONLY) 10 min  Subjective Data  Subjective More alert now.  Precautions  Precautions Knee;Fall  Required Braces or Orthoses Knee Immobilizer - Right (Instructed husband and patient on donning/doffing KI on RLE)  Knee Immobilizer - Right On when out of bed or walking  Restrictions  Weight Bearing Restrictions Yes  RLE Weight Bearing WBAT  Pain Assessment  Pain Assessment 0-10  Pain Score 6  Pain Location Rt knee  Pain Descriptors / Indicators Grimacing;Guarding  Pain Intervention(s) Monitored during session;Repositioned  Cognition  Arousal/Alertness Awake/alert  Behavior During Therapy Flat affect  Overall Cognitive Status History of cognitive impairments - at baseline  Bed Mobility  General bed mobility comments Patient in chair  Exercises  Exercises Total Joint  Total Joint Exercises  Ankle Circles/Pumps AROM;Both;10 reps;Seated  Quad Sets AROM;Right;10 reps;Seated  Short Arc Quad AROM;Right;10 reps;Seated  Heel Slides  AAROM;Right;10 reps;Seated  Hip ABduction/ADduction AAROM;Right;10 reps;Seated  Goniometric ROM 10 to 50  PT - End of Session  Activity Tolerance Patient tolerated treatment well;Patient limited by pain  Patient left in chair;with call bell/phone within reach;with family/visitor present  Nurse Communication Other (comment) (All education complete.  Ready for d/c from PT perspective)  PT - Assessment/Plan  PT Plan Current plan remains appropriate  PT Frequency (ACUTE ONLY) 7X/week  Follow Up Recommendations Home health PT;Supervision/Assistance - 24 hour  PT equipment Rolling walker with 5" wheels;3in1 (PT) (Husband also obtaining w/c from their [employer])  PT Goal Progression  Progress towards PT goals Progressing toward goals  PT General Charges  $$ ACUTE PT VISIT 1 Procedure  PT Treatments  $Therapeutic Exercise 8-22 mins  Durenda Hurt. Renaldo Fiddler, [facility] Acute Rehab Services Pager [phone]

## 2015-11-05 NOTE — Care Management Important Message (Signed)
Important Message  Patient Details  Name: OLA RAAP MRN: [id number] Date of Birth: 10-23-1960   Medicare Important Message Given:  Yes    Loni Delbridge P Von Quintanar 11/05/2015, 11:50 AM

## 2015-11-05 NOTE — Progress Notes (Signed)
Physical Therapy Treatment Patient Details Name: [name] MRN: [id number] DOB: [birth year] Today's Date: 11/05/2015    History of Present Illness Admitted for RTKA;  has a past medical history of Plaque psoriasis; Cerebral vascular malformation; Anxiety; Asthma; Depression; Rheumatoid arthritis(714.0) (2010); Nausea; PONV (postoperative nausea and vomiting); History of bronchitis as a child; History of migraine; Stroke (HCC) (03/26/2013); Fibromyalgia;  History of staph infection (28yrs ago); Ventricular septal defect; Sleep apnea; PFO (patent foramen ovale); Seizures (HCC) (15months ago 03/21/14); and Primary localized osteoarthritis of right knee (11/03/2015).     PT Comments    Patient making gains with mobility and gait.  Able to negotiate stairs with husband assisting safely.  Patient ready for d/c from PT perspective.  Will have f/u HHPT.  Follow Up Recommendations  Home health PT;Supervision/Assistance - 24 hour     Equipment Recommendations  Rolling walker with 5" wheels;3in1 (PT)    Recommendations for Other Services       Precautions / Restrictions Precautions Precautions: Knee;Fall Precaution Comments: Reviewed precautions with patient and husband Required Braces or Orthoses: Knee Immobilizer - Right Knee Immobilizer - Right: On when out of bed or walking Restrictions Weight Bearing Restrictions: Yes RLE Weight Bearing: Weight bearing as tolerated    Mobility  Bed Mobility               General bed mobility comments: Patient OOB in chair  Transfers Overall transfer level: Needs assistance Equipment used: Rolling walker (2 wheeled) Transfers: Sit to/from Stand Sit to Stand: Min assist         General transfer comment: Verbal cues for hand placement.  Assist to power up to standing, and to steady during transfer.  Husband instructed on safe guarding technique.  Ambulation/Gait Ambulation/Gait assistance: Min assist Ambulation Distance (Feet): 40  Feet Assistive device: Rolling walker (2 wheeled) Gait Pattern/deviations: Step-to pattern;Decreased stance time - right;Decreased step length - left;Decreased step length - right;Decreased stride length;Decreased weight shift to right;Antalgic Gait velocity: decreased Gait velocity interpretation: Below normal speed for age/gender General Gait Details: Verbal cues for walker placement.  Patient walking too far into RW - instructed patient and husband on placement/sequencing.  Patient with slow, antalgic gait.   Stairs Stairs: Yes Stairs assistance: Min assist Stair Management: No rails;Step to pattern;Backwards;With walker Number of Stairs: 3 General stair comments: Verbal cues and demonstration on negotiating stairs backward with walker.  Patient able to perform with min assist.  Husband instructed in and able to assist patient safely on stairs.  Wheelchair Mobility    Modified Rankin (Stroke Patients Only)       Balance Overall balance assessment: Needs assistance         Standing balance support: Bilateral upper extremity supported Standing balance-Leahy Scale: Poor Standing balance comment: Required min assist for balance in standing.                    Cognition Arousal/Alertness: Lethargic Behavior During Therapy: Flat affect Overall Cognitive Status: History of cognitive impairments - at baseline                      Exercises      General Comments        Pertinent Vitals/Pain Pain Assessment: 0-10 Pain Score: 8  Pain Location: Rt knee Pain Descriptors / Indicators: Grimacing;Guarding Pain Intervention(s): Monitored during session;Repositioned    Home Living  Prior Function            PT Goals (current goals can now be found in the care plan section) Acute Rehab PT Goals Patient Stated Goal: to go home today Progress towards PT goals: Progressing toward goals    Frequency  7X/week    PT Plan  Current plan remains appropriate    Co-evaluation             End of Session Equipment Utilized During Treatment: Gait belt;Right knee immobilizer Activity Tolerance: Patient limited by lethargy;Patient limited by pain Patient left: in chair;with call bell/phone within reach;with family/visitor present     Time: 1134-1202 PT Time Calculation (min) (ACUTE ONLY): 28 min  Charges:  $Gait Training: 23-37 mins                    G Codes:      Vena Austria 11-18-2015, 12:26 PM Durenda Hurt. Renaldo Fiddler, [facility] Acute Rehab Services Pager [phone]

## 2015-11-05 NOTE — Progress Notes (Signed)
Orthopedic Tech Progress Note Patient Details:  [name] Jul 09, 1961 [id number]  Ortho Devices Type of Ortho Device: Knee Immobilizer Ortho Device/Splint Interventions: Application   Saul Fordyce 11/05/2015, 1:30 PM

## 2015-11-08 NOTE — Discharge Summary (Signed)
Patient ID: [name] MRN: [id number] DOB/AGE: December 29, 1960 55 y.o.  Admit date: 11/03/2015 Discharge date: 11/05/15  Admission Diagnoses:  Principal Problem:   Primary localized osteoarthritis of right knee Active Problems:   Obstructive sleep apnea   CVA (cerebral infarction) history   QT prolongation   Patent foramen ovale   Panic anxiety syndrome   Complex partial epilepsy (HCC)   Rheumatoid arthritis (HCC)   Seizures (HCC)   Ventricular septal defect   History of staph infection   DJD (degenerative joint disease) of knee   Discharge Diagnoses:  Same  Past Medical History  Diagnosis Date  . IBS (irritable bowel syndrome)     mixed  . Allergic rhinitis     uses Flonase daily as needed and takes CLaritin daily  . Eczema   . Plaque psoriasis   . Cerebral vascular malformation     sees dr Lovell Sheehan for monitoring as needed, sees dr lewitt for headaches every 4 months  . Lactose intolerance   . Anxiety     takes Xanax daily as needed  . Asthma     Albuterol inhaler prn;SYmbicort daily  . Depression     takes Citalopram daily  . Rheumatoid arthritis(714.0) 2010    oa and ra;Rhemicade IV every 6wks and Metotrexate weekly  . Nausea     takes Zofran daily as needed  . GERD (gastroesophageal reflux disease)     takes Protonix daily  . Hyperlipidemia     takes Pravastatin daily  . PONV (postoperative nausea and vomiting)   . Shortness of breath     with exertion  . Pneumonia 26yrs ago    hx of  . History of bronchitis as a child   . History of migraine     last one 10+yrs ago  . Stroke (HCC) 03/26/2013    left sided weakness  . Joint pain   . Joint swelling   . Fibromyalgia   . Diverticulitis at age 59  . History of kidney stones   . Thyroid cyst   . Insomnia   . History of staph infection 28yrs ago  . Ventricular septal defect     2014 TEE lists large right to left shunt ASD/PFO (NO VSD mentioned)  . Sleep apnea     study done >57yrs ago;uses CPAP  nightly  . PFO (patent foramen ovale)   . Seizures (HCC) 12months ago 03/21/14    takes Depakote daily  . Primary localized osteoarthritis of right knee 11/03/2015    Surgeries: Procedure(s): RIGHT TOTAL KNEE ARTHROPLASTY/RIGHT on 11/03/2015   Consultants:    Discharged Condition: Improved  Hospital Course: [name] is an 55 y.o. female who was admitted 11/03/2015 for operative treatment ofPrimary localized osteoarthritis of right knee. Patient has severe unremitting pain that affects sleep, daily activities, and work/hobbies. After pre-op clearance the patient was taken to the operating room on 11/03/2015 and underwent  Procedure(s): RIGHT TOTAL KNEE ARTHROPLASTY/RIGHT.    Patient was given perioperative antibiotics:  Anti-infectives    Start     Dose/Rate Route Frequency Ordered Stop   11/03/15 1500  ceFAZolin (ANCEF) IVPB 2 g/50 mL premix     2 g 100 mL/hr over 30 Minutes Intravenous Every 6 hours 11/03/15 1236 11/03/15 2138   11/03/15 0907  cefUROXime (ZINACEF) injection  Status:  Discontinued       As needed 11/03/15 0907 11/03/15 1054       Patient was given sequential compression devices, early ambulation, and chemoprophylaxis to  prevent DVT.  Patient benefited maximally from hospital stay and there were no complications.    Recent vital signs: No data found.    Recent laboratory studies: No results for input(s): WBC, HGB, HCT, PLT, NA, K, CL, CO2, BUN, CREATININE, GLUCOSE, INR, CALCIUM in the last 72 hours.  Invalid input(s): PT, 2   Discharge Medications:     Medication List    STOP taking these medications        aspirin 81 MG tablet     HYDROcodone-acetaminophen 5-325 MG tablet  Commonly known as:  NORCO/VICODIN     Methotrexate (PF) 10 MG/0.4ML Soaj     ondansetron 4 MG tablet  Commonly known as:  ZOFRAN     ORENCIA IV      TAKE these medications        albuterol 108 (90 Base) MCG/ACT inhaler  Commonly known as:  PROVENTIL HFA;VENTOLIN HFA   Inhale 2 puffs into the lungs every 6 (six) hours as needed for wheezing or shortness of breath.     ALPRAZolam 0.5 MG tablet  Commonly known as:  XANAX  Take 0.5 mg by mouth 2 (two) times daily as needed for anxiety.     citalopram 20 MG tablet  Commonly known as:  CELEXA  TAKE TWO TABLETS BY MOUTH DAILY     clobetasol ointment 0.05 %  Commonly known as:  TEMOVATE  Apply 1 application topically 2 (two) times daily as needed (inflammation on feet.).     cycloSPORINE 0.05 % ophthalmic emulsion  Commonly known as:  RESTASIS  Place 4 drops into both eyes 2 (two) times daily.     divalproex 500 MG 24 hr tablet  Commonly known as:  DEPAKOTE ER  TAKE TWO TABLETS BY MOUTH TWICE DAILY     docusate sodium 100 MG capsule  Commonly known as:  COLACE  1 tab 2 times a day while on narcotics.  STOOL SOFTENER     enoxaparin 30 MG/0.3ML injection  Commonly known as:  LOVENOX  Inject 0.3 mLs (30 mg total) into the skin every 12 (twelve) hours.     fluticasone 50 MCG/ACT nasal spray  Commonly known as:  FLONASE  Place 2 sprays into both nostrils daily as needed for allergies.     folic acid 1 MG tablet  Commonly known as:  FOLVITE  Take 1 mg by mouth daily.     loratadine 10 MG tablet  Commonly known as:  CLARITIN  Take 10 mg by mouth daily.     metoCLOPramide 5 MG tablet  Commonly known as:  REGLAN  Take 1-2 tablets (5-10 mg total) by mouth every 8 (eight) hours as needed for nausea (if ondansetron (ZOFRAN) ineffective.).     multivitamin capsule  Take 1 capsule by mouth daily.     NON FORMULARY  Place 1 spray into the nose 2 (two) times daily as needed. Compounded Lidocaine Spray.  Use as directed for headaches. ([facility])     oxyCODONE 5 MG immediate release tablet  Commonly known as:  Oxy IR/ROXICODONE  1-2 tablet every 3-4 hrs as needed for break through pain.  SHORT ACTING PAIN MEDICATION.     oxyCODONE 20 mg 12 hr tablet  Commonly known as:  OXYCONTIN  1  TABLET EVERY 12 HRS    LONG ACTING PAIN MEDICATION     pantoprazole 40 MG tablet  Commonly known as:  PROTONIX  Take 40 mg by mouth daily.     polyethylene glycol  packet  Commonly known as:  MIRALAX / GLYCOLAX  17grams in 16 oz of water twice a day until bowel movement.  LAXITIVE.  Restart if two days since last bowel movement     pravastatin 20 MG tablet  Commonly known as:  PRAVACHOL  Take 20 mg by mouth daily.     PROBIOTIC DAILY PO  Take 1 capsule by mouth daily.     topiramate 50 MG tablet  Commonly known as:  TOPAMAX  TAKE ONE TABLET BY MOUTH TWICE DAILY        Diagnostic Studies: No results found.  Disposition: 01-Home or Self Care      Discharge Instructions    CPM    Complete by:  As directed   Continuous passive motion machine (CPM):      Use the CPM from 0 to 90 for 6 hours per day.       You may break it up into 2 or 3 sessions per day.      Use CPM for 2 weeks or until you are told to stop.     Call MD / Call 911    Complete by:  As directed   If you experience chest pain or shortness of breath, CALL 911 and be transported to the hospital emergency room.  If you develope a fever above 101 F, pus (white drainage) or increased drainage or redness at the wound, or calf pain, call your surgeon's office.     Change dressing    Complete by:  As directed   Change the gauze dressing daily with sterile 4 x 4 inch gauze and apply TED hose.  DO NOT REMOVE BANDAGE OVER SURGICAL INCISION.  WASH WHOLE LEG INCLUDING OVER THE WATERPROOF BANDAGE WITH SOAP AND WATER EVERY DAY.     Constipation Prevention    Complete by:  As directed   Drink plenty of fluids.  Prune juice may be helpful.  You may use a stool softener, such as Colace (over the counter) 100 mg twice a day.  Use MiraLax (over the counter) for constipation as needed.     Diet - low sodium heart healthy    Complete by:  As directed      Discharge instructions    Complete by:  As directed   INSTRUCTIONS AFTER  JOINT REPLACEMENT   Remove items at home which could result in a fall. This includes throw rugs or furniture in walking pathways ICE to the affected joint every three hours while awake for 30 minutes at a time, for at least the first 3-5 days, and then as needed for pain and swelling.  Continue to use ice for pain and swelling. You may notice swelling that will progress down to the foot and ankle.  This is normal after surgery.  Elevate your leg when you are not up walking on it.   Continue to use the breathing machine you got in the hospital (incentive spirometer) which will help keep your temperature down.  It is common for your temperature to cycle up and down following surgery, especially at night when you are not up moving around and exerting yourself.  The breathing machine keeps your lungs expanded and your temperature down.   DIET:  As you were doing prior to hospitalization, we recommend a well-balanced diet.  DRESSING / WOUND CARE / SHOWERING  Keep the surgical dressing until follow up.  The dressing is water proof, so you can shower without any extra covering.  IF  THE DRESSING FALLS OFF or the wound gets wet inside, change the dressing with sterile gauze.  Please use good hand washing techniques before changing the dressing.  Do not use any lotions or creams on the incision until instructed by your surgeon.    ACTIVITY  Increase activity slowly as tolerated, but follow the weight bearing instructions below.   No driving for 6 weeks or until further direction given by your physician.  You cannot drive while taking narcotics.  No lifting or carrying greater than 10 lbs. until further directed by your surgeon. Avoid periods of inactivity such as sitting longer than an hour when not asleep. This helps prevent blood clots.  You may return to work once you are authorized by your doctor.     WEIGHT BEARING   Weight bearing as tolerated with assist device (walker, cane, etc) as directed,  use it as long as suggested by your surgeon or therapist, typically at least 4 weeks.   EXERCISES  Results after joint replacement surgery are often greatly improved when you follow the exercise, range of motion and muscle strengthening exercises prescribed by your doctor. Safety measures are also important to protect the joint from further injury. Any time any of these exercises cause you to have increased pain or swelling, decrease what you are doing until you are comfortable again and then slowly increase them. If you have problems or questions, call your caregiver or physical therapist for advice.   Rehabilitation is important following a joint replacement. After just a few days of immobilization, the muscles of the leg can become weakened and shrink (atrophy).  These exercises are designed to build up the tone and strength of the thigh and leg muscles and to improve motion. Often times heat used for twenty to thirty minutes before working out will loosen up your tissues and help with improving the range of motion but do not use heat for the first two weeks following surgery (sometimes heat can increase post-operative swelling).   These exercises can be done on a training (exercise) mat, on the floor, on a table or on a bed. Use whatever works the best and is most comfortable for you.    Use music or television while you are exercising so that the exercises are a pleasant break in your day. This will make your life better with the exercises acting as a break in your routine that you can look forward to.   Perform all exercises about fifteen times, three times per day or as directed.  You should exercise both the operative leg and the other leg as well.   Exercises include:   Quad Sets - Tighten up the muscle on the front of the thigh (Quad) and hold for 5-10 seconds.   Straight Leg Raises - With your knee straight (if you were given a brace, keep it on), lift the leg to 60 degrees, hold for 3  seconds, and slowly lower the leg.  Perform this exercise against resistance later as your leg gets stronger.  Leg Slides: Lying on your back, slowly slide your foot toward your buttocks, bending your knee up off the floor (only go as far as is comfortable). Then slowly slide your foot back down until your leg is flat on the floor again.  Angel Wings: Lying on your back spread your legs to the side as far apart as you can without causing discomfort.  Hamstring Strength:  Lying on your back, push your heel against the floor  with your leg straight by tightening up the muscles of your buttocks.  Repeat, but this time bend your knee to a comfortable angle, and push your heel against the floor.  You may put a pillow under the heel to make it more comfortable if necessary.   A rehabilitation program following joint replacement surgery can speed recovery and prevent re-injury in the future due to weakened muscles. Contact your doctor or a physical therapist for more information on knee rehabilitation.    CONSTIPATION  Constipation is defined medically as fewer than three stools per week and severe constipation as less than one stool per week.  Even if you have a regular bowel pattern at home, your normal regimen is likely to be disrupted due to multiple reasons following surgery.  Combination of anesthesia, postoperative narcotics, change in appetite and fluid intake all can affect your bowels.   YOU MUST use at least one of the following options; they are listed in order of increasing strength to get the job done.  They are all available over the counter, and you may need to use some, POSSIBLY even all of these options:    Drink plenty of fluids (prune juice may be helpful) and high fiber foods Colace 100 mg by mouth twice a day  Senokot for constipation as directed and as needed Dulcolax (bisacodyl), take with full glass of water  Miralax (polyethylene glycol) once or twice a day as needed.  If you  have tried all these things and are unable to have a bowel movement in the first 3-4 days after surgery call either your surgeon or your primary doctor.    If you experience loose stools or diarrhea, hold the medications until you stool forms back up.  If your symptoms do not get better within 1 week or if they get worse, check with your doctor.  If you experience "the worst abdominal pain ever" or develop nausea or vomiting, please contact the office immediately for further recommendations for treatment.   ITCHING:  If you experience itching with your medications, try taking only a single pain pill, or even half a pain pill at a time.  You can also use Benadryl over the counter for itching or also to help with sleep.   TED HOSE STOCKINGS:  Use stockings on both legs until for at least 2 weeks or as directed by physician office. They may be removed at night for sleeping.  MEDICATIONS:  See your medication summary on the "After Visit Summary" that nursing will review with you.  You may have some home medications which will be placed on hold until you complete the course of blood thinner medication.  It is important for you to complete the blood thinner medication as prescribed.  PRECAUTIONS:  If you experience chest pain or shortness of breath - call 911 immediately for transfer to the hospital emergency department.   If you develop a fever greater that 101 F, purulent drainage from wound, increased redness or drainage from wound, foul odor from the wound/dressing, or calf pain - CONTACT YOUR SURGEON.                                                   FOLLOW-UP APPOINTMENTS:  If you do not already have a post-op appointment, please call the office for an appointment to be seen  by your surgeon.  Guidelines for how soon to be seen are listed in your "After Visit Summary", but are typically between 1-4 weeks after surgery.  OTHER INSTRUCTIONS:   Knee Replacement:  Do not place pillow under knee, focus  on keeping the knee straight while resting. CPM instructions: 0-90 degrees, 2 hours in the morning, 2 hours in the afternoon, and 2 hours in the evening. Place foam block, curve side up under heel at all times except when in CPM or when walking.  DO NOT modify, tear, cut, or change the foam block in any way.  MAKE SURE YOU:  Understand these instructions.  Get help right away if you are not doing well or get worse.    Thank you for letting us be a part of your medical care team.  It is a privilege we respect greatly.  We hope these instructions will help you stay on track for a fast and full recovery!     Do not put a pillow under the knee. Place it under the heel.    Complete by:  As directed   Place gray foam block, curve side up under heel at all times except when in CPM or when walking.  DO NOT modify, tear, cut, or change in any way the gray foam block.     Increase activity slowly as tolerated    Complete by:  As directed      TED hose    Complete by:  As directed   Use stockings (TED hose) for 2 weeks on both leg(s).  You may remove them at night for sleeping.           Follow-up Information    Follow up with [facility].   Why:  They will contact you to schedule home therapy visits.   Contact information:   [address] [place] [address] [phone]        Signed: Pascal Lux 11/08/2015, 3:48 PM

## 2015-11-25 ENCOUNTER — Ambulatory Visit (HOSPITAL_COMMUNITY): Payer: Medicare Other | Attending: Orthopedic Surgery

## 2015-11-25 ENCOUNTER — Encounter (HOSPITAL_COMMUNITY): Payer: Self-pay

## 2015-11-25 DIAGNOSIS — R269 Unspecified abnormalities of gait and mobility: Secondary | ICD-10-CM

## 2015-11-25 DIAGNOSIS — M25661 Stiffness of right knee, not elsewhere classified: Secondary | ICD-10-CM

## 2015-11-25 DIAGNOSIS — M25561 Pain in right knee: Secondary | ICD-10-CM

## 2015-11-25 DIAGNOSIS — M6281 Muscle weakness (generalized): Secondary | ICD-10-CM | POA: Diagnosis present

## 2015-11-25 DIAGNOSIS — Z96651 Presence of right artificial knee joint: Secondary | ICD-10-CM

## 2015-11-25 NOTE — Therapy (Signed)
[facility] ([facility]) [address] [place], [place], [address] Phone: [phone]   Fax:  [phone]  Physical Therapy Evaluation  Patient Details  Name: [name] MRN: [id number] Date of Birth: 1961-02-24 Referring Provider: Dr. Salvatore Marvel  Encounter Date: 11/25/2015      PT End of Session - 11/25/15 1818    Visit Number 1   Number of Visits 18   Date for PT Re-Evaluation 12/24/15   Authorization Type Medicare/ BCBS   Authorization Time Period 11/25/2015 to 01/24/2016   PT Start Time 1430   PT Stop Time 1515   PT Time Calculation (min) 45 min   Activity Tolerance Patient tolerated treatment well   Behavior During Therapy [facility] for tasks assessed/performed      Past Medical History  Diagnosis Date  . IBS (irritable bowel syndrome)     mixed  . Allergic rhinitis     uses Flonase daily as needed and takes CLaritin daily  . Eczema   . Plaque psoriasis   . Cerebral vascular malformation     sees dr Lovell Sheehan for monitoring as needed, sees dr lewitt for headaches every 4 months  . Lactose intolerance   . Anxiety     takes Xanax daily as needed  . Asthma     Albuterol inhaler prn;SYmbicort daily  . Depression     takes Citalopram daily  . Rheumatoid arthritis(714.0) 2010    oa and ra;Rhemicade IV every 6wks and Metotrexate weekly  . Nausea     takes Zofran daily as needed  . GERD (gastroesophageal reflux disease)     takes Protonix daily  . Hyperlipidemia     takes Pravastatin daily  . PONV (postoperative nausea and vomiting)   . Shortness of breath     with exertion  . Pneumonia 13yrs ago    hx of  . History of bronchitis as a child   . History of migraine     last one 10+yrs ago  . Stroke (HCC) 03/26/2013    left sided weakness  . Joint pain   . Joint swelling   . Fibromyalgia   . Diverticulitis at age 2  . History of kidney stones   . Thyroid cyst   . Insomnia   . History of staph infection 48yrs ago  . Ventricular  septal defect     2014 TEE lists large right to left shunt ASD/PFO (NO VSD mentioned)  . Sleep apnea     study done >31yrs ago;uses CPAP nightly  . PFO (patent foramen ovale)   . Seizures (HCC) 106months ago 03/21/14    takes Depakote daily  . Primary localized osteoarthritis of right knee 11/03/2015    Past Surgical History  Procedure Laterality Date  . Incontinence surgery  2010    sling done   . Knee arthroscopy  1992    left  . Foot surgery  1999    right ankle  . Kidney stone surgery  2001  . Appendectomy  1981  . Cholecystectomy  1981  . Tubal ligation  1990  . Total abdominal hysterectomy  2001  . Hernia repair  2006  . Left foot plating and scarping for arthritis  2011  . Right ear tube insertion  2011  . Incisional hernia repair  05/20/2012    Procedure: HERNIA REPAIR INCISIONAL;  Surgeon: Clovis Pu. Cornett, MD;  Location: [facility] ORS;  Service: General;  Laterality: N/A;  . Tee without cardioversion N/A 05/15/2013  Procedure: TRANSESOPHAGEAL ECHOCARDIOGRAM (TEE);  Surgeon: Pamella Pert, MD;  Location: [facility] ENDOSCOPY;  Service: Cardiovascular;  Laterality: N/A;  . Cardiac catheterization  2004  . Colonoscopy    . Thryoid biopsy    . Tonsillectomy and adenoidectomy  04/01/2014  . Tonsillectomy and adenoidectomy Bilateral 04/01/2014    Procedure: BILATERAL TONSILLECTOMY AND ADENOIDECTOMY;  Surgeon: Darletta Moll, MD;  Location: [facility] OR;  Service: ENT;  Laterality: Bilateral;  . Total knee arthroplasty Right 11/03/2015  . Total knee arthroplasty Right 11/03/2015    Procedure: RIGHT TOTAL KNEE ARTHROPLASTY/RIGHT;  Surgeon: Salvatore Marvel, MD;  Location: [facility] OR;  Service: Orthopedics;  Laterality: Right;    There were no vitals filed for this visit.  Visit Diagnosis:  Status post total right knee replacement - Plan: PT plan of care cert/re-cert  Pain in joint of right knee - Plan: PT plan of care cert/re-cert  Stiffness of knee joint, right - Plan: PT plan of care  cert/re-cert  Muscle weakness - Plan: PT plan of care cert/re-cert  Abnormality of gait - Plan: PT plan of care cert/re-cert      Subjective Assessment - 11/25/15 1439    Subjective [name] is a 55 yo female who c/o R knee tightness and pain s/p R TKA completed on 11/03/2015 by Dr. Salvatore Marvel. Pt reports that her pain has been managed well with rest and R LE elevation, but at times her pain may elevate to a 9/10 on a VAS.  R knee pain ranges between 0-9/10 on a VAS. Pt noted that she experienced an adverse reaction to her surgical dressing, which led to an infection on her R lower leg; however, she noted that her infection has been treated with antibiotics and has fully resolved.      Patient is accompained by: Family member   Pertinent History CVA, DJD, RA, chest pain, and seizures    Limitations Standing;Walking;Other (comment)  sleeping    How long can you sit comfortably? R knee pain >30 minutes    How long can you stand comfortably? R knee pain >30 minutes    How long can you walk comfortably? fatigue with > 10 minutes    Diagnostic tests N/A   Patient Stated Goals Pt's main goal is to improve her strength, improve her general mobility, and return to leisure activities including hiking and swimming   Currently in Pain? Yes   Pain Score 2    Pain Location Knee   Pain Orientation Right   Pain Descriptors / Indicators Aching   Pain Type Surgical pain   Pain Onset 1 to 4 weeks ago   Pain Frequency Intermittent   Aggravating Factors  sleeping, max R knee flexion,    Pain Relieving Factors change position, and prolonged sitting    Effect of Pain on Daily Activities cooking, leisure activities, and long distance ambulation    Multiple Pain Sites No            [facility] PT Assessment - 11/25/15 0001    Assessment   Medical Diagnosis s/p R TKA   Referring Provider Dr. Salvatore Marvel   Onset Date/Surgical Date 11/03/15   Hand Dominance Right   Next MD Visit End of Feb 2017    Prior Therapy Home health physical therapy post op    Precautions   Precautions Fall   Restrictions   Weight Bearing Restrictions No   Balance Screen   Has the patient fallen in the past 6 months No  Has the patient had a decrease in activity level because of a fear of falling?  Yes   Is the patient reluctant to leave their home because of a fear of falling?  Yes   Home Environment   Living Environment Private residence   Living Arrangements Spouse/significant other   Available Help at Discharge Family   Type of Home House   Home Access Stairs to enter   Entrance Stairs-Number of Steps 3   Entrance Stairs-Rails None   Home Layout One level   Home Equipment Walker - 2 wheels;Cane - single point;Other (comment);Bedside commode;Shower seat  CPM   Prior Function   Level of Independence Needs assistance with ADLs   Vocation On disability   Leisure hiking, walking, and swimming    Cognition   Overall Cognitive Status Within Functional Limits for tasks assessed   Attention Focused   Focused Attention Appears intact   Observation/Other Assessments-Edema    Edema Circumferential  Joint line= R knee edema= 40.5 cm ; L knee edema= 39.0 cm    AROM   Right Knee Extension 8   Right Knee Flexion 98   PROM   Right Knee Extension 7   Right Knee Flexion 100   Strength   Right Hip Flexion 4-/5   Right Hip Extension 4-/5   Right Hip ABduction 4-/5   Left Hip Flexion 4+/5   Left Hip Extension 4+/5   Left Hip ABduction 4+/5   Right Knee Flexion 3+/5   Right Knee Extension 3+/5   Left Knee Flexion 5/5   Left Knee Extension 5/5   Right Ankle Dorsiflexion 5/5   Right Ankle Plantar Flexion 4/5   Left Ankle Dorsiflexion 5/5   Left Ankle Plantar Flexion 4/5   Flexibility   Soft Tissue Assessment /Muscle Length yes   Hamstrings minor limitations, bilateral    Palpation   Patella mobility Minor to moderate limitations with R patellar mobility in S<>I direction    Ambulation/Gait    Ambulation/Gait Yes   Ambulation/Gait Assistance 5: Supervision   Ambulation Distance (Feet) 110 Feet   Assistive device Straight cane   Gait Pattern Decreased arm swing - right;Decreased arm swing - left;Decreased step length - left;Decreased stance time - right;Right flexed knee in stance  Limited R TKA at initial contact    Timed Up and Go Test   Normal TUG (seconds) 18  with single point cane                    [facility] Adult PT Treatment/Exercise - 11/25/15 0001    Knee/Hip Exercises: Supine   Quad Sets AAROM;1 set;10 reps;Other (comment)  with 5 sec hold    Short Arc Quad Sets Strengthening;Right;1 set;10 reps   Heel Slides AROM;AAROM;1 set;10 reps   Heel Slides Limitations with strap   Straight Leg Raises Strengthening;Right;1 set;10 reps   Other Supine Knee/Hip Exercises R hip abduction x 1 set of 10 reps                 PT Education - 11/25/15 1817    Education provided Yes   Education Details Educated pt on 5/5 fall precautions, initial HEP, R LE elevation to manage edema, and use of SPC    Person(s) Educated Patient   Methods Explanation;Demonstration;Handout;Verbal cues;Tactile cues   Comprehension Verbalized understanding;Returned demonstration;Need further instruction          PT Short Term Goals - 11/25/15 1834    PT SHORT TERM GOAL #1  Title Patient will be independent with TKA protocol HEP in order to further progress with ROM and strength at home.   Time 2   Period Weeks   Status New   PT SHORT TERM GOAL #2   Title Patient will report reduced max R knee pain to 6/10 on a VAS in order to improve tolerance with ther ex and ambulation.    Time 3   Period Weeks   Status New   PT SHORT TERM GOAL #3   Title Patient will demo improved R knee AAROM to 0-105 degrees in order to improve R knee mobility and ROM with ambulation and transfers.    Time 3   Period Weeks   Status New   PT SHORT TERM GOAL #4   Title Patient will independently  verbalize pain management strategies including use of cryotherapy in order to manage pain levels with ADLs and functional mobility activities.    Time 2   Period Weeks   Status New           PT Long Term Goals - 11/25/15 1837    PT LONG TERM GOAL #1   Title Patient will report reduced R knee pain to 0-4/10 on a VAS in order to improve tolerance with ther ex and ambulation.    Time 7   Period Weeks   Status New   PT LONG TERM GOAL #2   Title Patient will demo improved R knee AAROM to 0-120 degrees in order to improve R knee mobility and ROM with stair climbing and leisure activities.    Time 7   Period Weeks   Status New   PT LONG TERM GOAL #3   Title Patient will demo improved R quad and HS strength to >4/5 MMT in order to improve performance with outdoor ambulation and transfers.    Time 7   Period Weeks   Status New   PT LONG TERM GOAL #4   Title TUG time will improve to <14 seconds without the use of a SPC in order to reduce the risk for falls.   Time 7   Period Weeks   Status New   PT LONG TERM GOAL #5   Title Patient will independently ambulate on unlevel terrain for 15 minutes without the use of an AD with improved heel to toe sequence and improved R TKE at initial contact in order to progress towards a typical gait pattern.    Time 7   Period Weeks   Status New               Plan - 11/25/15 1822    Clinical Impression Statement [name] is a 55 yo female who was referred to outpatient PT to continue with post-op rehab s/p R TKA completed on 11/03/2015 by Dr. Thurston Hole. The pt presents with impairments and limitations that are consistent s/p R TKA including R knee pain, impaired strength of R quad/HS, impaired R patellar mobility in S<>I direction, impaired R knee AROM/PROM, impaired R quad/HS flexibility, abnormal gait pattern, R knee edema, and impaired dynamic balance. Palpable tenderness assessed of the lateral joint line and t/o the R quad muscle. Discussed  the importance of pain management and remaining compliant with her provided HEP in order to progress towards improved function, ROM, and strength. Pt verbalized full understanding. Pt had poor recall of her HEP that was provided by her homecare physical therapist and noted that she was unsure if she had a HEP handout at home.  Pt was provided a HEP handout this visit with further instructions provided on parameters and technique. The pt would benefit from skilled PT in order to improve R knee AROM/PROM, R quad/HS strength, R hip strength, R quad/HS/ITB/flexibility, patellar mobility, reduce edema, and reduce pain. Her surgical incision over the R knee is healing well with no signs or symptoms of infection assessed.     Pt will benefit from skilled therapeutic intervention in order to improve on the following deficits Abnormal gait;Decreased range of motion;Difficulty walking;Pain;Decreased balance;Hypomobility;Impaired flexibility;Increased edema;Decreased strength;Decreased mobility   Rehab Potential Good   PT Frequency Other (comment)  3x/week x 3 weeks and 2x/week x 4 weeks   PT Duration Other (comment)  7 weeks   PT Treatment/Interventions ADLs/Self Care Home Management;Cryotherapy;Electrical Stimulation;Moist Heat;Balance training;Therapeutic exercise;Therapeutic activities;Functional mobility training;Stair training;Gait training;DME Instruction;Neuromuscular re-education;Patient/family education;Manual techniques;Taping;Passive range of motion;Scar mobilization   PT Next Visit Plan Next visit to focus on R knee AAROM/AROM ther ex in supine, patellar mobs using grades I-III, gait training with walker, SLR into flexion and abduction, and R quad/HS strengthening   PT Home Exercise Plan Initiated basic R knee TKA HEP including quad sets, ankle pumps, SLR, SAQ, and hip abduction in supine. Review and update at next visit as needed.    Recommended Other Services None at this time    Consulted and Agree  with Plan of Care Patient          G-Codes - Dec 03, 2015 1842    Functional Assessment Tool Used Clinical findings including MMT, ROM, pain levels, TUG, and edema measurements    Functional Limitation Mobility: Walking and moving around   Mobility: Walking and Moving Around Current Status [phone]) At least 40 percent but less than 60 percent impaired, limited or restricted   Mobility: Walking and Moving Around Goal Status [phone]) At least 20 percent but less than 40 percent impaired, limited or restricted       Problem List Patient Active Problem List   Diagnosis Date Noted  . Primary localized osteoarthritis of right knee 11/03/2015  . DJD (degenerative joint disease) of knee 11/03/2015  . Rheumatoid arthritis (HCC)   . PONV (postoperative nausea and vomiting)   . Seizures (HCC)   . Fibromyalgia   . Sleep apnea   . Ventricular septal defect   . History of staph infection   . Refractory migraine 08/10/2014  . Complex partial epilepsy (HCC) 08/10/2014  . S/P tonsillectomy and adenoidectomy 04/01/2014  . Panic anxiety syndrome 06/03/2013  . Positive depression screening 06/03/2013  . Headache(784.0) 06/03/2013  . Unspecified cerebral artery occlusion with cerebral infarction 06/03/2013  . Headache 05/29/2013  . Patent foramen ovale 05/29/2013  . Chest pain 05/29/2013  . Other convulsions 05/29/2013  . Dysphagia, unspecified(787.20) 04/17/2013  . Acute bronchitis 03/27/2013  . Persistent headaches 03/26/2013  . CVA (cerebral infarction) history 03/26/2013  . Hypokalemia 03/26/2013  . Leukocytosis 03/26/2013  . Arthritis 03/26/2013  . QT prolongation 03/26/2013  . Stroke (HCC) 03/26/2013  . Recurrent ventral incisional hernia 04/19/2012  . Obstructive sleep apnea 08/22/2010  . RESTLESS LEGS SYNDROME 08/22/2010  . Moderate intermittent asthma without complication 08/22/2010  . ECZEMA 08/22/2010  . Irritable bowel syndrome 06/23/2010  . DIARRHEA 06/23/2010  . WEIGHT LOSS  11/01/2009  . NAUSEA WITH VOMITING 11/01/2009  . ABDOMINAL PAIN, LOWER 11/01/2009  . KNEE, ARTHRITIS, DEGEN./OSTEO 11/18/2008  . DERANGEMENT MENISCUS 11/02/2008  . JOINT EFFUSION, KNEE 11/02/2008  . KNEE PAIN 11/02/2008    Bonnee Quin, PT, DPT 03-Dec-2015, 7:02  PM  [facility] [address] [place], [place], [address] Phone: [phone]   Fax:  [phone]  Name: [name] MRN: [id number] Date of Birth: 28-Jan-1961

## 2015-11-25 NOTE — Patient Instructions (Signed)
   Quad Sets  Sit or lie on your back with leg straight.  Tighten your quadriceps muscle on the front of the thigh.  This movement should press the back of your knee downward and the knee cap to move toward your hip slightly.  Attempt to slightly lift the heel off the floor at the peak of the contraction. Complete 1 set of 10-15 reps with a 5 sec hold, 2-3x/day       SHORT ARC QUAD  - SAQ   Place a rolled up towel or object under your knee and slowly straighten your knee as your raise up  your foot. Complete 1 set of 10-15 reps with a 5 sec hold, 2-3x/day      SUPINE HIP ABDUCTION  While lying on your back, slowly bring your leg out to the side. Keep  your knee straight the entire time. Complete 1 set of 10-15 reps with a 5 sec hold, 2-3x/day     HEEL SLIDES - SUPINE  Lying on your back with knees straight, slide the affected heel towards your buttock as you bend your knee.   Hold a gentle stretch in this position and then return to original position. Complete 1 set of 10-15 reps with a 5 sec hold, 2-3x/day     STRAIGHT LEG RAISE - SLR  While lying or sitting, raise up your leg with a straight knee.  Keep the opposite knee bent with the foot planted to the ground. Complete 1 set of 10-15 reps with a 5 sec hold, 2-3x/day

## 2015-11-26 ENCOUNTER — Ambulatory Visit (HOSPITAL_COMMUNITY): Payer: Medicare Other

## 2015-11-26 ENCOUNTER — Encounter (HOSPITAL_COMMUNITY): Payer: Self-pay

## 2015-11-26 DIAGNOSIS — M25661 Stiffness of right knee, not elsewhere classified: Secondary | ICD-10-CM

## 2015-11-26 DIAGNOSIS — Z96651 Presence of right artificial knee joint: Secondary | ICD-10-CM | POA: Diagnosis not present

## 2015-11-26 DIAGNOSIS — R269 Unspecified abnormalities of gait and mobility: Secondary | ICD-10-CM

## 2015-11-26 DIAGNOSIS — M25561 Pain in right knee: Secondary | ICD-10-CM

## 2015-11-26 DIAGNOSIS — M6281 Muscle weakness (generalized): Secondary | ICD-10-CM

## 2015-11-26 NOTE — Therapy (Signed)
[facility] [address] [place], [place], [address] Phone: [phone]   Fax:  [phone]  Physical Therapy Treatment  Patient Details  Name: [name] MRN: [id number] Date of Birth: [birth year] Referring Provider: Dr. Salvatore Marvel  Encounter Date: 11/26/2015      PT End of Session - 11/26/15 1513    Visit Number 2   Number of Visits 18   Date for PT Re-Evaluation 12/24/15   Authorization Type Medicare/ BCBS   Authorization Time Period 11/25/2015 to 01/24/2016   PT Start Time 1432   PT Stop Time 1532   PT Time Calculation (min) 60 min   Activity Tolerance Patient tolerated treatment well   Behavior During Therapy [facility] for tasks assessed/performed      Past Medical History  Diagnosis Date  . IBS (irritable bowel syndrome)     mixed  . Allergic rhinitis     uses Flonase daily as needed and takes CLaritin daily  . Eczema   . Plaque psoriasis   . Cerebral vascular malformation     sees dr Lovell Sheehan for monitoring as needed, sees dr lewitt for headaches every 4 months  . Lactose intolerance   . Anxiety     takes Xanax daily as needed  . Asthma     Albuterol inhaler prn;SYmbicort daily  . Depression     takes Citalopram daily  . Rheumatoid arthritis(714.0) 2010    oa and ra;Rhemicade IV every 6wks and Metotrexate weekly  . Nausea     takes Zofran daily as needed  . GERD (gastroesophageal reflux disease)     takes Protonix daily  . Hyperlipidemia     takes Pravastatin daily  . PONV (postoperative nausea and vomiting)   . Shortness of breath     with exertion  . Pneumonia 41yrs ago    hx of  . History of bronchitis as a child   . History of migraine     last one 10+yrs ago  . Stroke (HCC) 03/26/2013    left sided weakness  . Joint pain   . Joint swelling   . Fibromyalgia   . Diverticulitis at age 14  . History of kidney stones   . Thyroid cyst   . Insomnia   . History of staph infection 5yrs ago  . Ventricular  septal defect     2014 TEE lists large right to left shunt ASD/PFO (NO VSD mentioned)  . Sleep apnea     study done >79yrs ago;uses CPAP nightly  . PFO (patent foramen ovale)   . Seizures (HCC) 9months ago 03/21/14    takes Depakote daily  . Primary localized osteoarthritis of right knee 11/03/2015    Past Surgical History  Procedure Laterality Date  . Incontinence surgery  2010    sling done   . Knee arthroscopy  1992    left  . Foot surgery  1999    right ankle  . Kidney stone surgery  2001  . Appendectomy  1981  . Cholecystectomy  1981  . Tubal ligation  1990  . Total abdominal hysterectomy  2001  . Hernia repair  2006  . Left foot plating and scarping for arthritis  2011  . Right ear tube insertion  2011  . Incisional hernia repair  05/20/2012    Procedure: HERNIA REPAIR INCISIONAL;  Surgeon: Clovis Pu. Cornett, MD;  Location: [facility] ORS;  Service: General;  Laterality: N/A;  . Tee without cardioversion N/A 05/15/2013  Procedure: TRANSESOPHAGEAL ECHOCARDIOGRAM (TEE);  Surgeon: Pamella Pert, MD;  Location: [facility] ENDOSCOPY;  Service: Cardiovascular;  Laterality: N/A;  . Cardiac catheterization  2004  . Colonoscopy    . Thryoid biopsy    . Tonsillectomy and adenoidectomy  04/01/2014  . Tonsillectomy and adenoidectomy Bilateral 04/01/2014    Procedure: BILATERAL TONSILLECTOMY AND ADENOIDECTOMY;  Surgeon: Darletta Moll, MD;  Location: [facility] OR;  Service: ENT;  Laterality: Bilateral;  . Total knee arthroplasty Right 11/03/2015  . Total knee arthroplasty Right 11/03/2015    Procedure: RIGHT TOTAL KNEE ARTHROPLASTY/RIGHT;  Surgeon: Salvatore Marvel, MD;  Location: [facility] OR;  Service: Orthopedics;  Laterality: Right;    There were no vitals filed for this visit.  Visit Diagnosis:  Status post total right knee replacement  Pain in joint of right knee  Stiffness of knee joint, right  Muscle weakness  Abnormality of gait      Subjective Assessment - 11/26/15 1438    Subjective R knee pain  rated a 5/10 on a VAS upon arrival. Pt noted that she was tired after yesterday's PT eval and is a "bit more sore". today. Pt reported that she was able to complete one more set of her HEP last night with poor to fair tolerance.    Pertinent History CVA, DJD, RA, chest pain, and seizures    Limitations Standing;Walking;Other (comment)   How long can you sit comfortably? R knee pain >30 minutes    How long can you stand comfortably? R knee pain >30 minutes    How long can you walk comfortably? fatigue with > 10 minutes    Diagnostic tests N/A   Patient Stated Goals Pt's main goal is to improve her strength, improve her general mobility, and return to leisure activities including hiking and swimming   Currently in Pain? Yes   Pain Score 5    Pain Location Knee   Pain Orientation Right   Pain Descriptors / Indicators Aching   Pain Type Surgical pain   Pain Onset 1 to 4 weeks ago   Pain Frequency Intermittent   Aggravating Factors  sleeping, max R knee flexion    Pain Relieving Factors change in position and prolonged sitting    Effect of Pain on Daily Activities cooking, leisure activities, and long distance ambulation    Multiple Pain Sites No                         [facility] Adult PT Treatment/Exercise - 11/26/15 0001    Ambulation/Gait   Ambulation/Gait Yes   Ambulation/Gait Assistance 5: Supervision  Gait Training completed    Ambulation Distance (Feet) 226 Feet   Assistive device Straight cane   Gait Pattern Decreased arm swing - right;Decreased arm swing - left;Decreased step length - left;Decreased stance time - right   Ambulation Surface Level   Gait Comments Gait training was focused on heel to toe gait pattern, increase R knee flexion during swing phase, and to increase R TKE at initial contact    Knee/Hip Exercises: Seated   Other Seated Knee/Hip Exercises short-sit position: R knee AAROM flexion with overpressure from contralateral LE x 10 reps with 3 sec hold    Knee/Hip Exercises: Supine   Quad Sets AAROM;1 set;10 reps;Other (comment) with 5 sec hold   Short Arc Quad Sets Strengthening;Right;10 reps;2 sets   Heel Slides AROM;AAROM;1 set;15 reps    Heel Slides Limitations with strap   Heel Prop for Knee Extension 5  minutes  with use of Zero Knee foam    Straight Leg Raises Strengthening;Right;2 sets;10 reps;Other (comment)  2nd set completed with min assist    Knee Extension AAROM   Knee Extension Limitations 5 degrees    Knee Flexion AAROM 102 degrees   Other Supine Knee/Hip Exercises R hip abduction x 2 set of 10 reps    Manual Therapy   Joint Mobilization R patellar mobs in S<>I direction using grade I-III in supine position    Soft tissue mobilization     PROM  R knee scar mobs     Into R knee flexion and extension x 10 reps                 PT Education - 11/26/15 1512    Education provided Yes   Education Details Educated pt on HEP, PT eval findings, R LE elevation to address edema, and use of SPC    Person(s) Educated Patient   Methods Explanation;Demonstration;Verbal cues;Tactile cues   Comprehension Verbalized understanding;Returned demonstration          PT Short Term Goals - 11/25/15 1834    PT SHORT TERM GOAL #1   Title Patient will be independent with TKA protocol HEP in order to further progress with ROM and strength at home.   Time 2   Period Weeks   Status New   PT SHORT TERM GOAL #2   Title Patient will report reduced max R knee pain to 6/10 on a VAS in order to improve tolerance with ther ex and ambulation.    Time 3   Period Weeks   Status New   PT SHORT TERM GOAL #3   Title Patient will demo improved R knee AAROM to 0-105 degrees in order to improve R knee mobility and ROM with ambulation and transfers.    Time 3   Period Weeks   Status New   PT SHORT TERM GOAL #4   Title Patient will independently verbalize pain management strategies including use of cryotherapy in order to manage pain  levels with ADLs and functional mobility activities.    Time 2   Period Weeks   Status New           PT Long Term Goals - 11/25/15 1837    PT LONG TERM GOAL #1   Title Patient will report reduced R knee pain to 0-4/10 on a VAS in order to improve tolerance with ther ex and ambulation.    Time 7   Period Weeks   Status New   PT LONG TERM GOAL #2   Title Patient will demo improved R knee AAROM to 0-120 degrees in order to improve R knee mobility and ROM with stair climbing and leisure activities.    Time 7   Period Weeks   Status New   PT LONG TERM GOAL #3   Title Patient will demo improved R quad and HS strength to >4/5 MMT in order to improve performance with outdoor ambulation and transfers.    Time 7   Period Weeks   Status New   PT LONG TERM GOAL #4   Title TUG time will improve to <14 seconds without the use of a SPC in order to reduce the risk for falls.   Time 7   Period Weeks   Status New   PT LONG TERM GOAL #5   Title Patient will independently ambulate on unlevel terrain for 15 minutes without the use of an AD with  improved heel to toe sequence and improved R TKE at initial contact in order to progress towards a typical gait pattern.    Time 7   Period Weeks   Status New               Plan - 11/26/15 1517    Clinical Impression Statement Reviewed and completed TKA protocol ther ex as documented with improved technique and understanding demo this visit. Pt had significant pain with initial reps with quad sets, but her symptoms quickly subsided with subsequent reps. Added reps/sets to ther ex with good tolerance reported. Pt required verbal and tactile cues for proper technique and parameters. Therapist thoroughly reviewed and instructed the pt on her current HEP. Pt brought in homecare booklet to today's PT visit, which included all post op TKA ther ex. Written instructions provided on parameters. Initiated patellar mobs and scar mobs this visit with good  tolerance reported. Improved patellar mobility in all directions assessed post manual techniques. Educated pt on proper scar mob technique with good understanding demo and verbalized with teach back method. R knee AAROM was measured to 5-102 degrees. Ended tx with gait training with SPC with focus on heel to toe sequence and to avoid R hip circumduction. Improved R TKE assessed since yesterday's PT visit. R knee pain was rated a 3-4/10 on a VAS at the completion of tx. Pt would continue to benefit from skilled PT tx in order to address current deficits and impairments.    Pt will benefit from skilled therapeutic intervention in order to improve on the following deficits Abnormal gait;Decreased range of motion;Difficulty walking;Pain;Decreased balance;Hypomobility;Impaired flexibility;Increased edema;Decreased strength;Decreased mobility   Rehab Potential Good   PT Frequency Other (comment)  3x/week for 3 weeks and 2x/week for 4 weeks    PT Duration Other (comment)  7 weeks    PT Treatment/Interventions ADLs/Self Care Home Management;Cryotherapy;Electrical Stimulation;Moist Heat;Balance training;Therapeutic exercise;Therapeutic activities;Functional mobility training;Stair training;Gait training;DME Instruction;Neuromuscular re-education;Patient/family education;Manual techniques;Taping;Passive range of motion;Scar mobilization   PT Next Visit Plan Next visit to focus on R knee AAROM/AROM ther ex in supine, patellar mobs using grades I-III, gait training with walker, SLR into flexion and abduction, and R quad/HS strengthening   PT Home Exercise Plan Reviewed HEP booklet that was provided from homecare PT with further writen and verbal instructions provided for proper technique and parameters    Recommended Other Services None at this time    Consulted and Agree with Plan of Care Patient          G-Codes - 11/28/2015 1842    Functional Assessment Tool Used Clinical findings including MMT, ROM, pain  levels, TUG, and edema measurements    Functional Limitation Mobility: Walking and moving around   Mobility: Walking and Moving Around Current Status [phone]) At least 40 percent but less than 60 percent impaired, limited or restricted   Mobility: Walking and Moving Around Goal Status [phone]) At least 20 percent but less than 40 percent impaired, limited or restricted      Problem List Patient Active Problem List   Diagnosis Date Noted  . Primary localized osteoarthritis of right knee 11/03/2015  . DJD (degenerative joint disease) of knee 11/03/2015  . Rheumatoid arthritis (HCC)   . PONV (postoperative nausea and vomiting)   . Seizures (HCC)   . Fibromyalgia   . Sleep apnea   . Ventricular septal defect   . History of staph infection   . Refractory migraine 08/10/2014  . Complex partial epilepsy (HCC) 08/10/2014  .  S/P tonsillectomy and adenoidectomy 04/01/2014  . Panic anxiety syndrome 06/03/2013  . Positive depression screening 06/03/2013  . Headache(784.0) 06/03/2013  . Unspecified cerebral artery occlusion with cerebral infarction 06/03/2013  . Headache 05/29/2013  . Patent foramen ovale 05/29/2013  . Chest pain 05/29/2013  . Other convulsions 05/29/2013  . Dysphagia, unspecified(787.20) 04/17/2013  . Acute bronchitis 03/27/2013  . Persistent headaches 03/26/2013  . CVA (cerebral infarction) history 03/26/2013  . Hypokalemia 03/26/2013  . Leukocytosis 03/26/2013  . Arthritis 03/26/2013  . QT prolongation 03/26/2013  . Stroke (HCC) 03/26/2013  . Recurrent ventral incisional hernia 04/19/2012  . Obstructive sleep apnea 08/22/2010  . RESTLESS LEGS SYNDROME 08/22/2010  . Moderate intermittent asthma without complication 08/22/2010  . ECZEMA 08/22/2010  . Irritable bowel syndrome 06/23/2010  . DIARRHEA 06/23/2010  . WEIGHT LOSS 11/01/2009  . NAUSEA WITH VOMITING 11/01/2009  . ABDOMINAL PAIN, LOWER 11/01/2009  . KNEE, ARTHRITIS, DEGEN./OSTEO 11/18/2008  . DERANGEMENT  MENISCUS 11/02/2008  . JOINT EFFUSION, KNEE 11/02/2008  . KNEE PAIN 11/02/2008    Bonnee Quin, PT, DPT  11/26/2015, 3:48 PM  [facility] [address] [place], [place], [address] Phone: [phone]   Fax:  [phone]  Name: [name] MRN: [id number] Date of Birth: 01-12-1961

## 2015-11-30 ENCOUNTER — Ambulatory Visit (HOSPITAL_COMMUNITY): Payer: Medicare Other | Admitting: Physical Therapy

## 2015-11-30 DIAGNOSIS — Z96651 Presence of right artificial knee joint: Secondary | ICD-10-CM

## 2015-11-30 DIAGNOSIS — M25661 Stiffness of right knee, not elsewhere classified: Secondary | ICD-10-CM

## 2015-11-30 DIAGNOSIS — M6281 Muscle weakness (generalized): Secondary | ICD-10-CM

## 2015-11-30 DIAGNOSIS — M25561 Pain in right knee: Secondary | ICD-10-CM

## 2015-11-30 DIAGNOSIS — R269 Unspecified abnormalities of gait and mobility: Secondary | ICD-10-CM

## 2015-11-30 NOTE — Therapy (Signed)
[facility] [address] [place], [place], [address] Phone: [phone]   Fax:  [phone]  Physical Therapy Treatment  Patient Details  Name: [name] MRN: [id number] Date of Birth: [birth year] Referring Provider: Dr. Salvatore Marvel  Encounter Date: 11/30/2015      PT End of Session - 11/30/15 1441    Visit Number 3   Number of Visits 18   Date for PT Re-Evaluation 12/24/15   Authorization Type Medicare/ BCBS   Authorization Time Period 11/25/2015 to 01/24/2016   PT Start Time 1351   PT Stop Time 1431   PT Time Calculation (min) 40 min   Activity Tolerance Patient tolerated treatment well   Behavior During Therapy [facility] for tasks assessed/performed      Past Medical History  Diagnosis Date  . IBS (irritable bowel syndrome)     mixed  . Allergic rhinitis     uses Flonase daily as needed and takes CLaritin daily  . Eczema   . Plaque psoriasis   . Cerebral vascular malformation     sees dr Lovell Sheehan for monitoring as needed, sees dr lewitt for headaches every 4 months  . Lactose intolerance   . Anxiety     takes Xanax daily as needed  . Asthma     Albuterol inhaler prn;SYmbicort daily  . Depression     takes Citalopram daily  . Rheumatoid arthritis(714.0) 2010    oa and ra;Rhemicade IV every 6wks and Metotrexate weekly  . Nausea     takes Zofran daily as needed  . GERD (gastroesophageal reflux disease)     takes Protonix daily  . Hyperlipidemia     takes Pravastatin daily  . PONV (postoperative nausea and vomiting)   . Shortness of breath     with exertion  . Pneumonia 51yrs ago    hx of  . History of bronchitis as a child   . History of migraine     last one 10+yrs ago  . Stroke (HCC) 03/26/2013    left sided weakness  . Joint pain   . Joint swelling   . Fibromyalgia   . Diverticulitis at age 50  . History of kidney stones   . Thyroid cyst   . Insomnia   . History of staph infection 80yrs ago  . Ventricular  septal defect     2014 TEE lists large right to left shunt ASD/PFO (NO VSD mentioned)  . Sleep apnea     study done >89yrs ago;uses CPAP nightly  . PFO (patent foramen ovale)   . Seizures (HCC) 71months ago 03/21/14    takes Depakote daily  . Primary localized osteoarthritis of right knee 11/03/2015    Past Surgical History  Procedure Laterality Date  . Incontinence surgery  2010    sling done   . Knee arthroscopy  1992    left  . Foot surgery  1999    right ankle  . Kidney stone surgery  2001  . Appendectomy  1981  . Cholecystectomy  1981  . Tubal ligation  1990  . Total abdominal hysterectomy  2001  . Hernia repair  2006  . Left foot plating and scarping for arthritis  2011  . Right ear tube insertion  2011  . Incisional hernia repair  05/20/2012    Procedure: HERNIA REPAIR INCISIONAL;  Surgeon: Clovis Pu. Cornett, MD;  Location: [facility] ORS;  Service: General;  Laterality: N/A;  . Tee without cardioversion N/A 05/15/2013  Procedure: TRANSESOPHAGEAL ECHOCARDIOGRAM (TEE);  Surgeon: Pamella Pert, MD;  Location: [facility] ENDOSCOPY;  Service: Cardiovascular;  Laterality: N/A;  . Cardiac catheterization  2004  . Colonoscopy    . Thryoid biopsy    . Tonsillectomy and adenoidectomy  04/01/2014  . Tonsillectomy and adenoidectomy Bilateral 04/01/2014    Procedure: BILATERAL TONSILLECTOMY AND ADENOIDECTOMY;  Surgeon: Darletta Moll, MD;  Location: [facility] OR;  Service: ENT;  Laterality: Bilateral;  . Total knee arthroplasty Right 11/03/2015  . Total knee arthroplasty Right 11/03/2015    Procedure: RIGHT TOTAL KNEE ARTHROPLASTY/RIGHT;  Surgeon: Salvatore Marvel, MD;  Location: [facility] OR;  Service: Orthopedics;  Laterality: Right;    There were no vitals filed for this visit.  Visit Diagnosis:  Status post total right knee replacement  Pain in joint of right knee  Stiffness of knee joint, right  Muscle weakness  Abnormality of gait      Subjective Assessment - 11/30/15 1354    Subjective Patient  reports she had quite a bit of pain last night, has been taking benadryl at night to try and help her sleep.    Pertinent History CVA, DJD, RA, chest pain, and seizures    Currently in Pain? Yes   Pain Score 5    Pain Location Knee  and calf    Pain Orientation Right   Pain Descriptors / Indicators Aching;Sharp   Pain Type Surgical pain   Pain Onset 1 to 4 weeks ago   Pain Frequency Intermittent                         [facility] Adult PT Treatment/Exercise - 11/30/15 0001    Knee/Hip Exercises: Stretches   Active Hamstring Stretch Right;3 reps;30 seconds   Active Hamstring Stretch Limitations 12 inch box    Quad Stretch Right;3 reps;30 seconds   Knee: Self-Stretch to increase Flexion Right   Knee: Self-Stretch Limitations 10 reps, 5 second holds    Theme park manager Both;3 reps;30 seconds   Gastroc Stretch Limitations slantboard    Knee/Hip Exercises: Engineer, technical sales 2 minutes   Rocker Board Limitations lateral B HHA    Knee/Hip Exercises: Seated   Heel Slides Right;2 sets;10 reps   Heel Slides Limitations 3 second holds, AAROM from L LE    Knee/Hip Exercises: Supine   Quad Sets Right;15 reps   [employer] Limitations 3 second holds, AAROM    Short Arc [employer] sets;10 reps   Short Arc Quad Sets Limitations 2 second holds                 PT Education - 11/30/15 1441    Education provided Yes   Education Details reviewed initial eval and goals    Person(s) Educated Patient   Methods Explanation   Comprehension Verbalized understanding          PT Short Term Goals - 11/25/15 1834    PT SHORT TERM GOAL #1   Title Patient will be independent with TKA protocol HEP in order to further progress with ROM and strength at home.   Time 2   Period Weeks   Status New   PT SHORT TERM GOAL #2   Title Patient will report reduced max R knee pain to 6/10 on a VAS in order to improve tolerance with ther ex and ambulation.    Time 3   Period  Weeks   Status New   PT SHORT TERM GOAL #3  Title Patient will demo improved R knee AAROM to 0-105 degrees in order to improve R knee mobility and ROM with ambulation and transfers.    Time 3   Period Weeks   Status New   PT SHORT TERM GOAL #4   Title Patient will independently verbalize pain management strategies including use of cryotherapy in order to manage pain levels with ADLs and functional mobility activities.    Time 2   Period Weeks   Status New           PT Long Term Goals - 11/25/15 1837    PT LONG TERM GOAL #1   Title Patient will report reduced R knee pain to 0-4/10 on a VAS in order to improve tolerance with ther ex and ambulation.    Time 7   Period Weeks   Status New   PT LONG TERM GOAL #2   Title Patient will demo improved R knee AAROM to 0-120 degrees in order to improve R knee mobility and ROM with stair climbing and leisure activities.    Time 7   Period Weeks   Status New   PT LONG TERM GOAL #3   Title Patient will demo improved R quad and HS strength to >4/5 MMT in order to improve performance with outdoor ambulation and transfers.    Time 7   Period Weeks   Status New   PT LONG TERM GOAL #4   Title TUG time will improve to <14 seconds without the use of a SPC in order to reduce the risk for falls.   Time 7   Period Weeks   Status New   PT LONG TERM GOAL #5   Title Patient will independently ambulate on unlevel terrain for 15 minutes without the use of an AD with improved heel to toe sequence and improved R TKE at initial contact in order to progress towards a typical gait pattern.    Time 7   Period Weeks   Status New               Plan - 11/30/15 1423    Clinical Impression Statement Focused on functional stretching and ROM based activities today with activities and exercises paced to patient toleerance and with rest breaks incorporated as needed due to tenderness in knee today. Introduced rockerboard today to assist with gait  pattern.. Reviewed initial eval and goals at end of session.    Pt will benefit from skilled therapeutic intervention in order to improve on the following deficits Abnormal gait;Decreased range of motion;Difficulty walking;Pain;Decreased balance;Hypomobility;Impaired flexibility;Increased edema;Decreased strength;Decreased mobility   Rehab Potential Good   PT Frequency Other (comment)   PT Duration Other (comment)   PT Treatment/Interventions ADLs/Self Care Home Management;Cryotherapy;Electrical Stimulation;Moist Heat;Balance training;Therapeutic exercise;Therapeutic activities;Functional mobility training;Stair training;Gait training;DME Instruction;Neuromuscular re-education;Patient/family education;Manual techniques;Taping;Passive range of motion;Scar mobilization   PT Next Visit Plan Next visit to focus on R knee AAROM/AROM ther ex in supine, patellar mobs using grades I-III, gait training with walker, SLR into flexion and abduction, and R quad/HS strengthening   PT Home Exercise Plan Reviewed HEP booklet that was provided from homecare PT with further writen and verbal instructions provided for proper technique and parameters    Consulted and Agree with Plan of Care Patient        Problem List Patient Active Problem List   Diagnosis Date Noted  . Primary localized osteoarthritis of right knee 11/03/2015  . DJD (degenerative joint disease) of knee 11/03/2015  . Rheumatoid arthritis (  HCC)   . PONV (postoperative nausea and vomiting)   . Seizures (HCC)   . Fibromyalgia   . Sleep apnea   . Ventricular septal defect   . History of staph infection   . Refractory migraine 08/10/2014  . Complex partial epilepsy (HCC) 08/10/2014  . S/P tonsillectomy and adenoidectomy 04/01/2014  . Panic anxiety syndrome 06/03/2013  . Positive depression screening 06/03/2013  . Headache(784.0) 06/03/2013  . Unspecified cerebral artery occlusion with cerebral infarction 06/03/2013  . Headache 05/29/2013   . Patent foramen ovale 05/29/2013  . Chest pain 05/29/2013  . Other convulsions 05/29/2013  . Dysphagia, unspecified(787.20) 04/17/2013  . Acute bronchitis 03/27/2013  . Persistent headaches 03/26/2013  . CVA (cerebral infarction) history 03/26/2013  . Hypokalemia 03/26/2013  . Leukocytosis 03/26/2013  . Arthritis 03/26/2013  . QT prolongation 03/26/2013  . Stroke (HCC) 03/26/2013  . Recurrent ventral incisional hernia 04/19/2012  . Obstructive sleep apnea 08/22/2010  . RESTLESS LEGS SYNDROME 08/22/2010  . Moderate intermittent asthma without complication 08/22/2010  . ECZEMA 08/22/2010  . Irritable bowel syndrome 06/23/2010  . DIARRHEA 06/23/2010  . WEIGHT LOSS 11/01/2009  . NAUSEA WITH VOMITING 11/01/2009  . ABDOMINAL PAIN, LOWER 11/01/2009  . KNEE, ARTHRITIS, DEGEN./OSTEO 11/18/2008  . DERANGEMENT MENISCUS 11/02/2008  . JOINT EFFUSION, KNEE 11/02/2008  . KNEE PAIN 11/02/2008    Nedra Hai PT, DPT [phone]  [facility] [address] [place], [place], [address] Phone: [phone]   Fax:  [phone]  Name: ASHLY YEPEZ MRN: [id number] Date of Birth: 11-27-1960

## 2015-12-06 ENCOUNTER — Ambulatory Visit (HOSPITAL_COMMUNITY): Payer: Medicare Other

## 2015-12-06 ENCOUNTER — Encounter (HOSPITAL_COMMUNITY): Payer: Self-pay

## 2015-12-06 DIAGNOSIS — M6281 Muscle weakness (generalized): Secondary | ICD-10-CM

## 2015-12-06 DIAGNOSIS — M25561 Pain in right knee: Secondary | ICD-10-CM

## 2015-12-06 DIAGNOSIS — Z96651 Presence of right artificial knee joint: Secondary | ICD-10-CM

## 2015-12-06 DIAGNOSIS — M25661 Stiffness of right knee, not elsewhere classified: Secondary | ICD-10-CM

## 2015-12-06 DIAGNOSIS — R269 Unspecified abnormalities of gait and mobility: Secondary | ICD-10-CM

## 2015-12-06 NOTE — Therapy (Signed)
[facility] [address] [place], [place], [address] Phone: [phone]   Fax:  [phone]  Physical Therapy Treatment  Patient Details  Name: [name] MRN: [id number] Date of Birth: 05/09/1961 Referring Provider: Dr. Salvatore Marvel  Encounter Date: 12/06/2015      PT End of Session - 12/06/15 1200    Visit Number 4   Number of Visits 18   Date for PT Re-Evaluation 12/24/15   Authorization Type Medicare/ BCBS   Authorization Time Period 11/25/2015 to 01/24/2016   PT Start Time 1147   PT Stop Time 1236   PT Time Calculation (min) 49 min   Activity Tolerance Patient tolerated treatment well;Patient limited by pain   Behavior During Therapy [facility] for tasks assessed/performed      Past Medical History  Diagnosis Date  . IBS (irritable bowel syndrome)     mixed  . Allergic rhinitis     uses Flonase daily as needed and takes CLaritin daily  . Eczema   . Plaque psoriasis   . Cerebral vascular malformation     sees dr Lovell Sheehan for monitoring as needed, sees dr lewitt for headaches every 4 months  . Lactose intolerance   . Anxiety     takes Xanax daily as needed  . Asthma     Albuterol inhaler prn;SYmbicort daily  . Depression     takes Citalopram daily  . Rheumatoid arthritis(714.0) 2010    oa and ra;Rhemicade IV every 6wks and Metotrexate weekly  . Nausea     takes Zofran daily as needed  . GERD (gastroesophageal reflux disease)     takes Protonix daily  . Hyperlipidemia     takes Pravastatin daily  . PONV (postoperative nausea and vomiting)   . Shortness of breath     with exertion  . Pneumonia 30yrs ago    hx of  . History of bronchitis as a child   . History of migraine     last one 10+yrs ago  . Stroke (HCC) 03/26/2013    left sided weakness  . Joint pain   . Joint swelling   . Fibromyalgia   . Diverticulitis at age 78  . History of kidney stones   . Thyroid cyst   . Insomnia   . History of staph infection  52yrs ago  . Ventricular septal defect     2014 TEE lists large right to left shunt ASD/PFO (NO VSD mentioned)  . Sleep apnea     study done >34yrs ago;uses CPAP nightly  . PFO (patent foramen ovale)   . Seizures (HCC) 31months ago 03/21/14    takes Depakote daily  . Primary localized osteoarthritis of right knee 11/03/2015    Past Surgical History  Procedure Laterality Date  . Incontinence surgery  2010    sling done   . Knee arthroscopy  1992    left  . Foot surgery  1999    right ankle  . Kidney stone surgery  2001  . Appendectomy  1981  . Cholecystectomy  1981  . Tubal ligation  1990  . Total abdominal hysterectomy  2001  . Hernia repair  2006  . Left foot plating and scarping for arthritis  2011  . Right ear tube insertion  2011  . Incisional hernia repair  05/20/2012    Procedure: HERNIA REPAIR INCISIONAL;  Surgeon: Clovis Pu. Cornett, MD;  Location: [facility] ORS;  Service: General;  Laterality: N/A;  . Tee without cardioversion N/A  05/15/2013    Procedure: TRANSESOPHAGEAL ECHOCARDIOGRAM (TEE);  Surgeon: Pamella Pert, MD;  Location: [facility] ENDOSCOPY;  Service: Cardiovascular;  Laterality: N/A;  . Cardiac catheterization  2004  . Colonoscopy    . Thryoid biopsy    . Tonsillectomy and adenoidectomy  04/01/2014  . Tonsillectomy and adenoidectomy Bilateral 04/01/2014    Procedure: BILATERAL TONSILLECTOMY AND ADENOIDECTOMY;  Surgeon: Darletta Moll, MD;  Location: [facility] OR;  Service: ENT;  Laterality: Bilateral;  . Total knee arthroplasty Right 11/03/2015  . Total knee arthroplasty Right 11/03/2015    Procedure: RIGHT TOTAL KNEE ARTHROPLASTY/RIGHT;  Surgeon: Salvatore Marvel, MD;  Location: [facility] OR;  Service: Orthopedics;  Laterality: Right;    There were no vitals filed for this visit.  Visit Diagnosis:  Status post total right knee replacement  Stiffness of knee joint, right  Muscle weakness  Abnormality of gait  Pain in joint of right knee      Subjective Assessment - 12/06/15 1151     Subjective Pt reports that her R knee "is doing much better" in regards to mobility and pain. R knee pain was rated a 2/10 on a VAS upon arrival. Pt further noted that she is able to complete shower and car transfers with less difficulty.   Patient is accompained by: Family member   Pertinent History CVA, DJD, RA, chest pain, and seizures    Limitations Standing;Walking   How long can you sit comfortably? R knee pain >30 minutes    How long can you stand comfortably? R knee pain >30 minutes    How long can you walk comfortably? fatigue with > 15 minutes    Diagnostic tests N/A   Patient Stated Goals Pt's main goal is to improve her strength, improve her general mobility, and return to leisure activities including hiking and swimming   Currently in Pain? Yes   Pain Score 2   R knee pain has ranged between a 0-8/10 on a VAS since last PT visit   Pain Location Knee   Pain Orientation Right   Pain Descriptors / Indicators Aching;Burning   Pain Type Surgical pain   Pain Onset More than a month ago   Pain Frequency Intermittent   Aggravating Factors  sleeping and max R knee flexion   Pain Relieving Factors change in position and pain meds   Effect of Pain on Daily Activities cooking, leisure activities, and long distance ambulation    Multiple Pain Sites No                         [facility] Adult PT Treatment/Exercise - 12/06/15 0001    Ambulation/Gait   Ambulation/Gait Yes   Ambulation/Gait Assistance 5: Supervision   Ambulation/Gait Assistance Details SPC   Ambulation Distance (Feet) 226 Feet   Assistive device Straight cane   Gait Pattern Decreased arm swing - right;Decreased arm swing - left;Decreased step length - left;Decreased stance time - right   Ambulation Surface Level   Gait Comments Gait training was focused on heel to toe gait pattern, increase R knee flexion during swing phase, and to increase R TKE at initial contact    Knee/Hip Exercises: Stretches   Active  Hamstring Stretch Right;3 reps;30 seconds   Active Hamstring Stretch Limitations supine with strap    Knee/Hip Exercises: Standing   Wall Squat 1 set;5 reps   Wall Squat Limitations with use of physio ball on wall   discontinued due to L knee pain from hx  of OA   Knee/Hip Exercises: Supine   Quad Sets Right;15 reps;2 sets   Quad Sets Limitations 3 seconds    Short Arc [employer] Right;2 sets;15 reps   Short Arc Quad Sets Limitations 2 seconds   Heel Slides AROM;AAROM;1 set;15 reps   Heel Slides Limitations with strap   Heel Prop for Knee Extension 3 minutes;Other (comment)  with Zero Knee foam    Straight Leg Raises Strengthening;Right;2 sets;10 reps;Other (comment)   Knee Extension AAROM   Knee Extension Limitations 5 degrees    Knee Flexion AAROM   Knee Flexion Limitations 110 degrees   Knee/Hip Exercises: Sidelying   Hip ABduction Strengthening;Right;2 sets;10 reps   Manual Therapy   Joint Mobilization R patellar mobs in S<>I direction using grade I-III in supine position    Soft tissue mobilization R knee scar mobs   Passive ROM R knee into extension and flexion x 10 reps                 PT Education - 12/06/15 1159    Education provided Yes   Education Details Reviewed HEP, proper heel to toe sequence with use of SPC, and R LE elevation to manage edema   Person(s) Educated Patient   Methods Explanation;Demonstration;Verbal cues   Comprehension Verbalized understanding;Returned demonstration          PT Short Term Goals - 11/25/15 1834    PT SHORT TERM GOAL #1   Title Patient will be independent with TKA protocol HEP in order to further progress with ROM and strength at home.   Time 2   Period Weeks   Status New   PT SHORT TERM GOAL #2   Title Patient will report reduced max R knee pain to 6/10 on a VAS in order to improve tolerance with ther ex and ambulation.    Time 3   Period Weeks   Status New   PT SHORT TERM GOAL #3   Title Patient will demo  improved R knee AAROM to 0-105 degrees in order to improve R knee mobility and ROM with ambulation and transfers.    Time 3   Period Weeks   Status New   PT SHORT TERM GOAL #4   Title Patient will independently verbalize pain management strategies including use of cryotherapy in order to manage pain levels with ADLs and functional mobility activities.    Time 2   Period Weeks   Status New           PT Long Term Goals - 11/25/15 1837    PT LONG TERM GOAL #1   Title Patient will report reduced R knee pain to 0-4/10 on a VAS in order to improve tolerance with ther ex and ambulation.    Time 7   Period Weeks   Status New   PT LONG TERM GOAL #2   Title Patient will demo improved R knee AAROM to 0-120 degrees in order to improve R knee mobility and ROM with stair climbing and leisure activities.    Time 7   Period Weeks   Status New   PT LONG TERM GOAL #3   Title Patient will demo improved R quad and HS strength to >4/5 MMT in order to improve performance with outdoor ambulation and transfers.    Time 7   Period Weeks   Status New   PT LONG TERM GOAL #4   Title TUG time will improve to <14 seconds without the use of a SPC  in order to reduce the risk for falls.   Time 7   Period Weeks   Status New   PT LONG TERM GOAL #5   Title Patient will independently ambulate on unlevel terrain for 15 minutes without the use of an AD with improved heel to toe sequence and improved R TKE at initial contact in order to progress towards a typical gait pattern.    Time 7   Period Weeks   Status New               Plan - 12/06/15 1202    Clinical Impression Statement PT tx focused on R knee AAROM/PROM/AROM ther ex, progressed hip/knee strengthening ther ex, manual therapy techniques, and gait training with SPC. Reviewed current HEP with improved understanding demo and verbalized. Reviewed proper technique and parameters with each ther ex. Progressed hip abduction from supine to S/L  position with good tolerance reported. Minor R LE fatigue reported with all ther ex with brief rest breaks required between sets. Added mini squats with use of physio ball against wall. Verbal cues required for proper knee alignment and to avoid valgus stress on knee. Mini squats were discontinued due to increased L knee pain from hx of OA. R knee pain rated a 2/10 on a VAS at the completion of PT tx. Improved gait pattern and sequence assessed with use of SPC. Less verbal cues required for heel to toe sequence. She continues to present with limited R TKE at initial contact and frequently presents with R flexed knee posture in standing.  Pt is making steady progress towards stated PT goals with improved R knee ROM and improved R LE strength. Continue with current POC and progress per post-op protocol.    Pt will benefit from skilled therapeutic intervention in order to improve on the following deficits Abnormal gait;Decreased range of motion;Difficulty walking;Pain;Decreased balance;Hypomobility;Impaired flexibility;Increased edema;Decreased strength;Decreased mobility   Rehab Potential Good   PT Frequency Other (comment)  3x/week x 3 weeks and 2x/week x 4 weeks   PT Duration Other (comment)  7 weeks   PT Treatment/Interventions ADLs/Self Care Home Management;Cryotherapy;Electrical Stimulation;Moist Heat;Balance training;Therapeutic exercise;Therapeutic activities;Functional mobility training;Stair training;Gait training;DME Instruction;Neuromuscular re-education;Patient/family education;Manual techniques;Taping;Passive range of motion;Scar mobilization   PT Next Visit Plan Next visit to focus on R knee AAROM/AROM ther ex in supine, patellar mobs using grades I-III,  and R quad TKE in standing.    PT Home Exercise Plan Reviewed HEP with no changes made this visit    Recommended Other Services None at this time    Consulted and Agree with Plan of Care Patient        Problem List Patient Active  Problem List   Diagnosis Date Noted  . Primary localized osteoarthritis of right knee 11/03/2015  . DJD (degenerative joint disease) of knee 11/03/2015  . Rheumatoid arthritis (HCC)   . PONV (postoperative nausea and vomiting)   . Seizures (HCC)   . Fibromyalgia   . Sleep apnea   . Ventricular septal defect   . History of staph infection   . Refractory migraine 08/10/2014  . Complex partial epilepsy (HCC) 08/10/2014  . S/P tonsillectomy and adenoidectomy 04/01/2014  . Panic anxiety syndrome 06/03/2013  . Positive depression screening 06/03/2013  . Headache(784.0) 06/03/2013  . Unspecified cerebral artery occlusion with cerebral infarction 06/03/2013  . Headache 05/29/2013  . Patent foramen ovale 05/29/2013  . Chest pain 05/29/2013  . Other convulsions 05/29/2013  . Dysphagia, unspecified(787.20) 04/17/2013  . Acute bronchitis 03/27/2013  .  Persistent headaches 03/26/2013  . CVA (cerebral infarction) history 03/26/2013  . Hypokalemia 03/26/2013  . Leukocytosis 03/26/2013  . Arthritis 03/26/2013  . QT prolongation 03/26/2013  . Stroke (HCC) 03/26/2013  . Recurrent ventral incisional hernia 04/19/2012  . Obstructive sleep apnea 08/22/2010  . RESTLESS LEGS SYNDROME 08/22/2010  . Moderate intermittent asthma without complication 08/22/2010  . ECZEMA 08/22/2010  . Irritable bowel syndrome 06/23/2010  . DIARRHEA 06/23/2010  . WEIGHT LOSS 11/01/2009  . NAUSEA WITH VOMITING 11/01/2009  . ABDOMINAL PAIN, LOWER 11/01/2009  . KNEE, ARTHRITIS, DEGEN./OSTEO 11/18/2008  . DERANGEMENT MENISCUS 11/02/2008  . JOINT EFFUSION, KNEE 11/02/2008  . KNEE PAIN 11/02/2008    Bonnee Quin, PT, DPT  12/06/2015, 12:39 PM  [facility] [address] [place], [place], [address] Phone: [phone]   Fax:  [phone]  Name: ROSEZELL LOVEL MRN: [id number] Date of Birth: [birth year]

## 2015-12-08 ENCOUNTER — Ambulatory Visit (HOSPITAL_COMMUNITY): Payer: Medicare Other

## 2015-12-10 ENCOUNTER — Ambulatory Visit (HOSPITAL_COMMUNITY): Payer: Medicare Other | Admitting: Physical Therapy

## 2015-12-10 DIAGNOSIS — Z96651 Presence of right artificial knee joint: Secondary | ICD-10-CM

## 2015-12-10 DIAGNOSIS — M25561 Pain in right knee: Secondary | ICD-10-CM

## 2015-12-10 DIAGNOSIS — M6281 Muscle weakness (generalized): Secondary | ICD-10-CM

## 2015-12-10 DIAGNOSIS — R269 Unspecified abnormalities of gait and mobility: Secondary | ICD-10-CM

## 2015-12-10 DIAGNOSIS — M25661 Stiffness of right knee, not elsewhere classified: Secondary | ICD-10-CM

## 2015-12-10 NOTE — Therapy (Signed)
[facility] [address] [place], [place], [address] Phone: [phone]   Fax:  [phone]  Physical Therapy Treatment  Patient Details  Name: [name] MRN: [id number] Date of Birth: September 17, 1961 Referring Provider: Dr. Salvatore Marvel  Encounter Date: 12/10/2015      PT End of Session - 12/10/15 1218    Visit Number 5   Number of Visits 18   Date for PT Re-Evaluation 12/24/15   Authorization Type Medicare/ BCBS   Authorization Time Period 11/25/2015 to 01/24/2016   PT Start Time 1145   PT Stop Time 1228   PT Time Calculation (min) 43 min   Activity Tolerance Patient tolerated treatment well   Behavior During Therapy [facility] for tasks assessed/performed      Past Medical History  Diagnosis Date  . IBS (irritable bowel syndrome)     mixed  . Allergic rhinitis     uses Flonase daily as needed and takes CLaritin daily  . Eczema   . Plaque psoriasis   . Cerebral vascular malformation     sees dr Lovell Sheehan for monitoring as needed, sees dr lewitt for headaches every 4 months  . Lactose intolerance   . Anxiety     takes Xanax daily as needed  . Asthma     Albuterol inhaler prn;SYmbicort daily  . Depression     takes Citalopram daily  . Rheumatoid arthritis(714.0) 2010    oa and ra;Rhemicade IV every 6wks and Metotrexate weekly  . Nausea     takes Zofran daily as needed  . GERD (gastroesophageal reflux disease)     takes Protonix daily  . Hyperlipidemia     takes Pravastatin daily  . PONV (postoperative nausea and vomiting)   . Shortness of breath     with exertion  . Pneumonia 66yrs ago    hx of  . History of bronchitis as a child   . History of migraine     last one 10+yrs ago  . Stroke (HCC) 03/26/2013    left sided weakness  . Joint pain   . Joint swelling   . Fibromyalgia   . Diverticulitis at age 60  . History of kidney stones   . Thyroid cyst   . Insomnia   . History of staph infection 82yrs ago  . Ventricular  septal defect     2014 TEE lists large right to left shunt ASD/PFO (NO VSD mentioned)  . Sleep apnea     study done >40yrs ago;uses CPAP nightly  . PFO (patent foramen ovale)   . Seizures (HCC) 2months ago 03/21/14    takes Depakote daily  . Primary localized osteoarthritis of right knee 11/03/2015    Past Surgical History  Procedure Laterality Date  . Incontinence surgery  2010    sling done   . Knee arthroscopy  1992    left  . Foot surgery  1999    right ankle  . Kidney stone surgery  2001  . Appendectomy  1981  . Cholecystectomy  1981  . Tubal ligation  1990  . Total abdominal hysterectomy  2001  . Hernia repair  2006  . Left foot plating and scarping for arthritis  2011  . Right ear tube insertion  2011  . Incisional hernia repair  05/20/2012    Procedure: HERNIA REPAIR INCISIONAL;  Surgeon: Clovis Pu. Cornett, MD;  Location: [facility] ORS;  Service: General;  Laterality: N/A;  . Tee without cardioversion N/A 05/15/2013  Procedure: TRANSESOPHAGEAL ECHOCARDIOGRAM (TEE);  Surgeon: Pamella Pert, MD;  Location: [facility] ENDOSCOPY;  Service: Cardiovascular;  Laterality: N/A;  . Cardiac catheterization  2004  . Colonoscopy    . Thryoid biopsy    . Tonsillectomy and adenoidectomy  04/01/2014  . Tonsillectomy and adenoidectomy Bilateral 04/01/2014    Procedure: BILATERAL TONSILLECTOMY AND ADENOIDECTOMY;  Surgeon: Darletta Moll, MD;  Location: [facility] OR;  Service: ENT;  Laterality: Bilateral;  . Total knee arthroplasty Right 11/03/2015  . Total knee arthroplasty Right 11/03/2015    Procedure: RIGHT TOTAL KNEE ARTHROPLASTY/RIGHT;  Surgeon: Salvatore Marvel, MD;  Location: [facility] OR;  Service: Orthopedics;  Laterality: Right;    There were no vitals filed for this visit.  Visit Diagnosis:  Status post total right knee replacement  Stiffness of knee joint, right  Muscle weakness  Abnormality of gait  Pain in joint of right knee      Subjective Assessment - 12/10/15 1144    Subjective Pt states that  first thing in the morning she is still very stiff.  Walking is gettng better.    Currently in Pain? Yes   Pain Score 3    Pain Location Knee   Pain Orientation Right   Pain Descriptors / Indicators Aching;Burning   Pain Type Surgical pain                         [facility] Adult PT Treatment/Exercise - 12/10/15 0001    Exercises   Exercises Knee/Hip   Knee/Hip Exercises: Stretches   Active Hamstring Stretch Right;3 reps;30 seconds   Active Hamstring Stretch Limitations supine with strap    Knee: Self-Stretch to increase Flexion Right;3 reps;30 seconds   Gastroc Stretch Both;3 reps;30 seconds   Gastroc Stretch Limitations slantboard    Knee/Hip Exercises: Standing   Heel Raises Both;10 reps   Knee Flexion Right;10 reps   Terminal Knee Extension Strengthening;Right;10 reps   Forward Step Up Right;10 reps;Step Height: 4"   Functional Squat 10 reps   Rocker Board 2 minutes   Knee/Hip Exercises: Supine   Quad Sets 10 reps   Short Arc Quad Sets Strengthening;10 reps   Heel Slides 10 reps   Terminal Knee Extension Strengthening;10 reps   Knee Extension Limitations 5   Knee Flexion Limitations 115   Manual Therapy   Joint Mobilization R patellar mobs in S<>I direction using grade I-III in supine position    Soft tissue mobilization R knee scar mobs   Passive ROM R knee into extension and flexion x 3 reps for 30"                  PT Short Term Goals - 11/25/15 1834    PT SHORT TERM GOAL #1   Title Patient will be independent with TKA protocol HEP in order to further progress with ROM and strength at home.   Time 2   Period Weeks   Status New   PT SHORT TERM GOAL #2   Title Patient will report reduced max R knee pain to 6/10 on a VAS in order to improve tolerance with ther ex and ambulation.    Time 3   Period Weeks   Status New   PT SHORT TERM GOAL #3   Title Patient will demo improved R knee AAROM to 0-105 degrees in order to improve R knee mobility  and ROM with ambulation and transfers.    Time 3   Period Weeks   Status  New   PT SHORT TERM GOAL #4   Title Patient will independently verbalize pain management strategies including use of cryotherapy in order to manage pain levels with ADLs and functional mobility activities.    Time 2   Period Weeks   Status New           PT Long Term Goals - 11/25/15 1837    PT LONG TERM GOAL #1   Title Patient will report reduced R knee pain to 0-4/10 on a VAS in order to improve tolerance with ther ex and ambulation.    Time 7   Period Weeks   Status New   PT LONG TERM GOAL #2   Title Patient will demo improved R knee AAROM to 0-120 degrees in order to improve R knee mobility and ROM with stair climbing and leisure activities.    Time 7   Period Weeks   Status New   PT LONG TERM GOAL #3   Title Patient will demo improved R quad and HS strength to >4/5 MMT in order to improve performance with outdoor ambulation and transfers.    Time 7   Period Weeks   Status New   PT LONG TERM GOAL #4   Title TUG time will improve to <14 seconds without the use of a SPC in order to reduce the risk for falls.   Time 7   Period Weeks   Status New   PT LONG TERM GOAL #5   Title Patient will independently ambulate on unlevel terrain for 15 minutes without the use of an AD with improved heel to toe sequence and improved R TKE at initial contact in order to progress towards a typical gait pattern.    Time 7   Period Weeks   Status New               Plan - 12/10/15 1219    Clinical Impression Statement PT progressed in closed chain exercises with minimal cuing neeed for proper technique.  Discontinues ball squats due ot pt complaint of it bothering her other knee which will soon need a knee replacement.Manual techniques used to decrease swelling for decreased pain.  Pt demonstrating improved gait pattern .    Pt will benefit from skilled therapeutic intervention in order to improve on the  following deficits Abnormal gait;Decreased range of motion;Difficulty walking;Pain;Decreased balance;Hypomobility;Impaired flexibility;Increased edema;Decreased strength;Decreased mobility   Rehab Potential Good   PT Next Visit Plan begin lateral step ups and Single leg stance next visit         Problem List Patient Active Problem List   Diagnosis Date Noted  . Primary localized osteoarthritis of right knee 11/03/2015  . DJD (degenerative joint disease) of knee 11/03/2015  . Rheumatoid arthritis (HCC)   . PONV (postoperative nausea and vomiting)   . Seizures (HCC)   . Fibromyalgia   . Sleep apnea   . Ventricular septal defect   . History of staph infection   . Refractory migraine 08/10/2014  . Complex partial epilepsy (HCC) 08/10/2014  . S/P tonsillectomy and adenoidectomy 04/01/2014  . Panic anxiety syndrome 06/03/2013  . Positive depression screening 06/03/2013  . Headache(784.0) 06/03/2013  . Unspecified cerebral artery occlusion with cerebral infarction 06/03/2013  . Headache 05/29/2013  . Patent foramen ovale 05/29/2013  . Chest pain 05/29/2013  . Other convulsions 05/29/2013  . Dysphagia, unspecified(787.20) 04/17/2013  . Acute bronchitis 03/27/2013  . Persistent headaches 03/26/2013  . CVA (cerebral infarction) history 03/26/2013  . Hypokalemia 03/26/2013  .  Leukocytosis 03/26/2013  . Arthritis 03/26/2013  . QT prolongation 03/26/2013  . Stroke (HCC) 03/26/2013  . Recurrent ventral incisional hernia 04/19/2012  . Obstructive sleep apnea 08/22/2010  . RESTLESS LEGS SYNDROME 08/22/2010  . Moderate intermittent asthma without complication 08/22/2010  . ECZEMA 08/22/2010  . Irritable bowel syndrome 06/23/2010  . DIARRHEA 06/23/2010  . WEIGHT LOSS 11/01/2009  . NAUSEA WITH VOMITING 11/01/2009  . ABDOMINAL PAIN, LOWER 11/01/2009  . KNEE, ARTHRITIS, DEGEN./OSTEO 11/18/2008  . DERANGEMENT MENISCUS 11/02/2008  . JOINT EFFUSION, KNEE 11/02/2008  . KNEE PAIN  11/02/2008   Virgina Organ, PT CLT [phone] 12/10/2015, 12:31 PM   [facility] [address] [place], [place], [address] Phone: [phone]   Fax:  [phone]  Name: [name] MRN: [id number] Date of Birth: [birth year]

## 2015-12-13 ENCOUNTER — Ambulatory Visit (HOSPITAL_COMMUNITY): Payer: Medicare Other

## 2015-12-13 DIAGNOSIS — R269 Unspecified abnormalities of gait and mobility: Secondary | ICD-10-CM

## 2015-12-13 DIAGNOSIS — M6281 Muscle weakness (generalized): Secondary | ICD-10-CM

## 2015-12-13 DIAGNOSIS — Z96651 Presence of right artificial knee joint: Secondary | ICD-10-CM | POA: Diagnosis not present

## 2015-12-13 DIAGNOSIS — M25561 Pain in right knee: Secondary | ICD-10-CM

## 2015-12-13 DIAGNOSIS — M25661 Stiffness of right knee, not elsewhere classified: Secondary | ICD-10-CM

## 2015-12-13 NOTE — Therapy (Signed)
[place] [facility] [address] [place], [place], [address] Phone: [phone]   Fax:  [phone]  Physical Therapy Treatment  Patient Details  Name: [name] MRN: [id number] Date of Birth: [birth year] Referring Provider: Dr. Salvatore Marvel  Encounter Date: 12/13/2015      PT End of Session - 12/13/15 1020    Visit Number 6   Number of Visits 18   Date for PT Re-Evaluation 12/24/15   Authorization Type Medicare/ BCBS   Authorization Time Period 11/25/2015 to 01/24/2016   PT Start Time 1018   PT Stop Time 1100   PT Time Calculation (min) 42 min   Activity Tolerance Patient tolerated treatment well   Behavior During Therapy [facility] for tasks assessed/performed      Past Medical History  Diagnosis Date  . IBS (irritable bowel syndrome)     mixed  . Allergic rhinitis     uses Flonase daily as needed and takes CLaritin daily  . Eczema   . Plaque psoriasis   . Cerebral vascular malformation     sees dr Lovell Sheehan for monitoring as needed, sees dr lewitt for headaches every 4 months  . Lactose intolerance   . Anxiety     takes Xanax daily as needed  . Asthma     Albuterol inhaler prn;SYmbicort daily  . Depression     takes Citalopram daily  . Rheumatoid arthritis(714.0) 2010    oa and ra;Rhemicade IV every 6wks and Metotrexate weekly  . Nausea     takes Zofran daily as needed  . GERD (gastroesophageal reflux disease)     takes Protonix daily  . Hyperlipidemia     takes Pravastatin daily  . PONV (postoperative nausea and vomiting)   . Shortness of breath     with exertion  . Pneumonia 37yrs ago    hx of  . History of bronchitis as a child   . History of migraine     last one 10+yrs ago  . Stroke (HCC) 03/26/2013    left sided weakness  . Joint pain   . Joint swelling   . Fibromyalgia   . Diverticulitis at age 28  . History of kidney stones   . Thyroid cyst   . Insomnia   . History of staph infection 38yrs ago  . Ventricular  septal defect     2014 TEE lists large right to left shunt ASD/PFO (NO VSD mentioned)  . Sleep apnea     study done >25yrs ago;uses CPAP nightly  . PFO (patent foramen ovale)   . Seizures (HCC) 38months ago 03/21/14    takes Depakote daily  . Primary localized osteoarthritis of right knee 11/03/2015    Past Surgical History  Procedure Laterality Date  . Incontinence surgery  2010    sling done   . Knee arthroscopy  1992    left  . Foot surgery  1999    right ankle  . Kidney stone surgery  2001  . Appendectomy  1981  . Cholecystectomy  1981  . Tubal ligation  1990  . Total abdominal hysterectomy  2001  . Hernia repair  2006  . Left foot plating and scarping for arthritis  2011  . Right ear tube insertion  2011  . Incisional hernia repair  05/20/2012    Procedure: HERNIA REPAIR INCISIONAL;  Surgeon: Clovis Pu. Cornett, MD;  Location: [facility] ORS;  Service: General;  Laterality: N/A;  . Tee without cardioversion N/A 05/15/2013  Procedure: TRANSESOPHAGEAL ECHOCARDIOGRAM (TEE);  Surgeon: Pamella Pert, MD;  Location: [facility] ENDOSCOPY;  Service: Cardiovascular;  Laterality: N/A;  . Cardiac catheterization  2004  . Colonoscopy    . Thryoid biopsy    . Tonsillectomy and adenoidectomy  04/01/2014  . Tonsillectomy and adenoidectomy Bilateral 04/01/2014    Procedure: BILATERAL TONSILLECTOMY AND ADENOIDECTOMY;  Surgeon: Darletta Moll, MD;  Location: [facility] OR;  Service: ENT;  Laterality: Bilateral;  . Total knee arthroplasty Right 11/03/2015  . Total knee arthroplasty Right 11/03/2015    Procedure: RIGHT TOTAL KNEE ARTHROPLASTY/RIGHT;  Surgeon: Salvatore Marvel, MD;  Location: [facility] OR;  Service: Orthopedics;  Laterality: Right;    There were no vitals filed for this visit.  Visit Diagnosis:  Status post total right knee replacement  Stiffness of knee joint, right  Muscle weakness  Abnormality of gait  Pain in joint of right knee      Subjective Assessment - 12/13/15 1024    Subjective Pt reported  minor R knee pain that was rated a 2/10 on a VAS upon arrival. Pt noted that her R knee "is moving along", but her R hip has been sore. pt noted that she is able to "walk a whole lot better" and stand for longer periods of time with less soreness. Pt noted that she has a f/u appt with her surgeon later today.     Pertinent History CVA, DJD, RA, chest pain, and seizures    Limitations Standing;Walking   Diagnostic tests N/A   Patient Stated Goals Pt's main goal is to improve her strength, improve her general mobility, and return to leisure activities including hiking and swimming   Currently in Pain? Yes   Pain Score 2   R knee pain has ranged between a 2-5/10 on a VAS since last PT visit    Pain Location Knee   Pain Orientation Right   Pain Descriptors / Indicators Aching;Burning   Pain Type Surgical pain   Pain Onset More than a month ago   Pain Frequency Intermittent   Aggravating Factors  max R knee flexion and prolonged sitting/standing     Pain Relieving Factors change in position and pain meds    Effect of Pain on Daily Activities cooking, leisure activities, and long distance ambulation             [facility] PT Assessment - 12/13/15 0001    AROM   Right Knee Extension 5   Right Knee Flexion 117   PROM   Right Knee Extension 5   Right Knee Flexion 120   Strength   Right Knee Flexion 3+/5   Right Knee Extension 4-/5                     [facility] Adult PT Treatment/Exercise - 12/13/15 0001    Exercises   Exercises Knee/Hip   Knee/Hip Exercises: Stretches   Active Hamstring Stretch Right;3 reps;30 seconds   Active Hamstring Stretch Limitations supine with strap    Gastroc Stretch Both;3 reps;30 seconds   Gastroc Stretch Limitations slantboard    Knee/Hip Exercises: Standing   Heel Raises Both;15 reps   Knee Flexion Right;15 reps   Terminal Knee Extension Strengthening;Right;15 reps   Lateral Step Up Right;10 reps;Step Height: 4";Hand Hold: 1   Forward Step Up  Right;10 reps;Step Height: 4"   Functional Squat 15 reps   Functional Squat Limitations partial squat with B UE support on counter    Rocker Board --   SLS  R SLS x 4 sets for 10 seconds with minor L UE support    Knee/Hip Exercises: Supine   Quad Sets Right;15 reps;2 sets   Short Arc Quad Sets Strengthening;10 reps   Heel Slides AROM;AAROM;1 set;10 reps  1 set without strap    Heel Slides Limitations with strap   Manual Therapy   Joint Mobilization R patellar mobs in S<>I direction using grade I-III in supine position    Soft tissue mobilization R knee scar mobs   Passive ROM R knee into extension and flexion x 15 reps with 5 sec hold                PT Education - 12/13/15 1036    Education provided Yes   Education Details Educated pt on current HEP and R knee/LE pain management    Person(s) Educated Patient   Methods Explanation   Comprehension Verbalized understanding;Returned demonstration          PT Short Term Goals - 11/25/15 1834    PT SHORT TERM GOAL #1   Title Patient will be independent with TKA protocol HEP in order to further progress with ROM and strength at home.   Time 2   Period Weeks   Status New   PT SHORT TERM GOAL #2   Title Patient will report reduced max R knee pain to 6/10 on a VAS in order to improve tolerance with ther ex and ambulation.    Time 3   Period Weeks   Status New   PT SHORT TERM GOAL #3   Title Patient will demo improved R knee AAROM to 0-105 degrees in order to improve R knee mobility and ROM with ambulation and transfers.    Time 3   Period Weeks   Status New   PT SHORT TERM GOAL #4   Title Patient will independently verbalize pain management strategies including use of cryotherapy in order to manage pain levels with ADLs and functional mobility activities.    Time 2   Period Weeks   Status New           PT Long Term Goals - 11/25/15 1837    PT LONG TERM GOAL #1   Title Patient will report reduced R knee pain  to 0-4/10 on a VAS in order to improve tolerance with ther ex and ambulation.    Time 7   Period Weeks   Status New   PT LONG TERM GOAL #2   Title Patient will demo improved R knee AAROM to 0-120 degrees in order to improve R knee mobility and ROM with stair climbing and leisure activities.    Time 7   Period Weeks   Status New   PT LONG TERM GOAL #3   Title Patient will demo improved R quad and HS strength to >4/5 MMT in order to improve performance with outdoor ambulation and transfers.    Time 7   Period Weeks   Status New   PT LONG TERM GOAL #4   Title TUG time will improve to <14 seconds without the use of a SPC in order to reduce the risk for falls.   Time 7   Period Weeks   Status New   PT LONG TERM GOAL #5   Title Patient will independently ambulate on unlevel terrain for 15 minutes without the use of an AD with improved heel to toe sequence and improved R TKE at initial contact in order to progress towards a typical gait  pattern.    Time 7   Period Weeks   Status New               Plan - 12/13/15 1021    Clinical Impression Statement PT tx focused on R knee AAROM/PROM/AROM ther ex, patellar mobs to reduce pain and improve mobility with knee extension, and standing CKC strengthening ther ex. Added lateral step-ups and R SLS with fair to good tolerance reported per pt. Occasional cues required for proper knee alignment and technique with step-ups. Improved R knee AAROM assessed since last PT visit, which was measured 5-120 degrees at the end of PT tx. Pt is making steady progress towards stated PT goals with improved R knee ROM, strength, and reduce pain levels. Pt would benefit from continued skilled PT to further progress with R knee AROM into extension, improve hip/knee strength, improve dynamic balance, and improve gait pattern.   Pt will benefit from skilled therapeutic intervention in order to improve on the following deficits Abnormal gait;Decreased range of  motion;Difficulty walking;Pain;Decreased balance;Hypomobility;Impaired flexibility;Increased edema;Decreased strength;Decreased mobility   Rehab Potential Good   PT Frequency --  3x/week x 3 weeks and 2x/week x 4 weeks   PT Duration Other (comment)  7 weeks    PT Treatment/Interventions ADLs/Self Care Home Management;Cryotherapy;Electrical Stimulation;Moist Heat;Balance training;Therapeutic exercise;Therapeutic activities;Functional mobility training;Stair training;Gait training;DME Instruction;Neuromuscular re-education;Patient/family education;Manual techniques;Taping;Passive range of motion;Scar mobilization   PT Next Visit Plan Review and assess tolerance with lateral step ups and single leg stance next visit. Continue with R knee AAROM/AROM ther ex, R quad/HS CKC strengthening, and dynamic balance training    PT Home Exercise Plan Reviewed HEP with no changes made this visit    Consulted and Agree with Plan of Care Patient        Problem List Patient Active Problem List   Diagnosis Date Noted  . Primary localized osteoarthritis of right knee 11/03/2015  . DJD (degenerative joint disease) of knee 11/03/2015  . Rheumatoid arthritis (HCC)   . PONV (postoperative nausea and vomiting)   . Seizures (HCC)   . Fibromyalgia   . Sleep apnea   . Ventricular septal defect   . History of staph infection   . Refractory migraine 08/10/2014  . Complex partial epilepsy (HCC) 08/10/2014  . S/P tonsillectomy and adenoidectomy 04/01/2014  . Panic anxiety syndrome 06/03/2013  . Positive depression screening 06/03/2013  . Headache(784.0) 06/03/2013  . Unspecified cerebral artery occlusion with cerebral infarction 06/03/2013  . Headache 05/29/2013  . Patent foramen ovale 05/29/2013  . Chest pain 05/29/2013  . Other convulsions 05/29/2013  . Dysphagia, unspecified(787.20) 04/17/2013  . Acute bronchitis 03/27/2013  . Persistent headaches 03/26/2013  . CVA (cerebral infarction) history 03/26/2013   . Hypokalemia 03/26/2013  . Leukocytosis 03/26/2013  . Arthritis 03/26/2013  . QT prolongation 03/26/2013  . Stroke (HCC) 03/26/2013  . Recurrent ventral incisional hernia 04/19/2012  . Obstructive sleep apnea 08/22/2010  . RESTLESS LEGS SYNDROME 08/22/2010  . Moderate intermittent asthma without complication 08/22/2010  . ECZEMA 08/22/2010  . Irritable bowel syndrome 06/23/2010  . DIARRHEA 06/23/2010  . WEIGHT LOSS 11/01/2009  . NAUSEA WITH VOMITING 11/01/2009  . ABDOMINAL PAIN, LOWER 11/01/2009  . KNEE, ARTHRITIS, DEGEN./OSTEO 11/18/2008  . DERANGEMENT MENISCUS 11/02/2008  . JOINT EFFUSION, KNEE 11/02/2008  . KNEE PAIN 11/02/2008    Bonnee Quin, PT,DPT  12/13/2015, 10:59 AM  [facility] [address] [place], [place], [address] Phone: [phone]   Fax:  [phone]  Name:  [name] MRN: [id number] Date of Birth: March 18, 1961

## 2015-12-15 ENCOUNTER — Ambulatory Visit (HOSPITAL_COMMUNITY): Payer: Medicare Other | Attending: Orthopedic Surgery | Admitting: Physical Therapy

## 2015-12-15 DIAGNOSIS — M25661 Stiffness of right knee, not elsewhere classified: Secondary | ICD-10-CM

## 2015-12-15 DIAGNOSIS — M6281 Muscle weakness (generalized): Secondary | ICD-10-CM | POA: Diagnosis present

## 2015-12-15 DIAGNOSIS — M25561 Pain in right knee: Secondary | ICD-10-CM

## 2015-12-15 DIAGNOSIS — Z96651 Presence of right artificial knee joint: Secondary | ICD-10-CM | POA: Diagnosis not present

## 2015-12-15 DIAGNOSIS — R269 Unspecified abnormalities of gait and mobility: Secondary | ICD-10-CM

## 2015-12-15 NOTE — Therapy (Signed)
[place] [facility] [address] [place], [place], [address] Phone: [phone]   Fax:  [phone]  Physical Therapy Treatment  Patient Details  Name: [name] MRN: [id number] Date of Birth: 02-02-1961 Referring Provider: Dr. Salvatore Marvel  Encounter Date: 12/15/2015      PT End of Session - 12/15/15 1151    Visit Number 7   Number of Visits 18   Date for PT Re-Evaluation 12/24/15   Authorization Type Medicare/ BCBS   Authorization Time Period 11/25/2015 to 01/24/2016   Authorization - Visit Number 7   Authorization - Number of Visits 10   PT Start Time 1104   PT Stop Time 1145   PT Time Calculation (min) 41 min   Activity Tolerance Patient tolerated treatment well   Behavior During Therapy [facility] for tasks assessed/performed      Past Medical History  Diagnosis Date  . IBS (irritable bowel syndrome)     mixed  . Allergic rhinitis     uses Flonase daily as needed and takes CLaritin daily  . Eczema   . Plaque psoriasis   . Cerebral vascular malformation     sees dr Lovell Sheehan for monitoring as needed, sees dr lewitt for headaches every 4 months  . Lactose intolerance   . Anxiety     takes Xanax daily as needed  . Asthma     Albuterol inhaler prn;SYmbicort daily  . Depression     takes Citalopram daily  . Rheumatoid arthritis(714.0) 2010    oa and ra;Rhemicade IV every 6wks and Metotrexate weekly  . Nausea     takes Zofran daily as needed  . GERD (gastroesophageal reflux disease)     takes Protonix daily  . Hyperlipidemia     takes Pravastatin daily  . PONV (postoperative nausea and vomiting)   . Shortness of breath     with exertion  . Pneumonia 74yrs ago    hx of  . History of bronchitis as a child   . History of migraine     last one 10+yrs ago  . Stroke (HCC) 03/26/2013    left sided weakness  . Joint pain   . Joint swelling   . Fibromyalgia   . Diverticulitis at age 64  . History of kidney stones   . Thyroid  cyst   . Insomnia   . History of staph infection 30yrs ago  . Ventricular septal defect     2014 TEE lists large right to left shunt ASD/PFO (NO VSD mentioned)  . Sleep apnea     study done >58yrs ago;uses CPAP nightly  . PFO (patent foramen ovale)   . Seizures (HCC) 78months ago 03/21/14    takes Depakote daily  . Primary localized osteoarthritis of right knee 11/03/2015    Past Surgical History  Procedure Laterality Date  . Incontinence surgery  2010    sling done   . Knee arthroscopy  1992    left  . Foot surgery  1999    right ankle  . Kidney stone surgery  2001  . Appendectomy  1981  . Cholecystectomy  1981  . Tubal ligation  1990  . Total abdominal hysterectomy  2001  . Hernia repair  2006  . Left foot plating and scarping for arthritis  2011  . Right ear tube insertion  2011  . Incisional hernia repair  05/20/2012    Procedure: HERNIA REPAIR INCISIONAL;  Surgeon: Clovis Pu. Cornett, MD;  Location: [facility] ORS;  Service: General;  Laterality: N/A;  . Tee without cardioversion N/A 05/15/2013    Procedure: TRANSESOPHAGEAL ECHOCARDIOGRAM (TEE);  Surgeon: Pamella Pert, MD;  Location: [facility] ENDOSCOPY;  Service: Cardiovascular;  Laterality: N/A;  . Cardiac catheterization  2004  . Colonoscopy    . Thryoid biopsy    . Tonsillectomy and adenoidectomy  04/01/2014  . Tonsillectomy and adenoidectomy Bilateral 04/01/2014    Procedure: BILATERAL TONSILLECTOMY AND ADENOIDECTOMY;  Surgeon: Darletta Moll, MD;  [facility];  Service: ENT;  Laterality: Bilateral;  . Total knee arthroplasty Right 11/03/2015  . Total knee arthroplasty Right 11/03/2015    Procedure: RIGHT TOTAL KNEE ARTHROPLASTY/RIGHT;  Surgeon: Salvatore Marvel, MD;  Location: [facility] OR;  Service: Orthopedics;  Laterality: Right;    There were no vitals filed for this visit.  Visit Diagnosis:  Status post total right knee replacement  Stiffness of knee joint, right  Muscle weakness  Pain in joint of right knee  Abnormality of  gait      Subjective Assessment - 12/15/15 1106    Subjective Went to the doctor on Monday and they are planning to proceed with a lt TKA 02/07/16. Rt knee is doing well and is overall pleased with her progress.    Currently in Pain? Yes   Pain Score 1    Pain Location Knee   Pain Orientation Right   Pain Descriptors / Indicators Aching   Pain Type Surgical pain                         [facility] Adult PT Treatment/Exercise - 12/15/15 0001    Knee/Hip Exercises: Standing   Lateral Step Up Right;10 reps;Step Height: 6";Hand Hold: 2   Forward Step Up Right;10 reps;Step Height: 4"   Functional Squat 1 set;10 reps   Functional Squat Limitations standard height chair, encouraging weight shift to Rt LE.    Knee/Hip Exercises: Supine   Quad Sets Right;15 reps;2 sets   Short Arc Quad Sets Strengthening;15 reps   Heel Slides AAROM;Right;1 set;10 reps   Heel Slides Limitations assist at end range   Knee Extension AAROM   Knee Extension Limitations 4 degrees supine with overpressure   Knee Flexion AAROM   Knee Flexion Limitations 115   Manual Therapy   Joint Mobilization R patellar mobs in S<>I direction using grade I-III in supine position    Soft tissue mobilization STM to hamstring and calf musculature                PT Education - 12/15/15 1150    Education provided Yes   Education Details reinforced need or knee extension stretch at home and increasing focus on Rt LE with sit/stand during the day.    Person(s) Educated Patient   Methods Explanation;Demonstration;Tactile cues;Verbal cues   Comprehension Verbalized understanding;Returned demonstration          PT Short Term Goals - 11/25/15 1834    PT SHORT TERM GOAL #1   Title Patient will be independent with TKA protocol HEP in order to further progress with ROM and strength at home.   Time 2   Period Weeks   Status New   PT SHORT TERM GOAL #2   Title Patient will report reduced max R knee pain to  6/10 on a VAS in order to improve tolerance with ther ex and ambulation.    Time 3   Period Weeks   Status New   PT SHORT TERM GOAL #3  Title Patient will demo improved R knee AAROM to 0-105 degrees in order to improve R knee mobility and ROM with ambulation and transfers.    Time 3   Period Weeks   Status New   PT SHORT TERM GOAL #4   Title Patient will independently verbalize pain management strategies including use of cryotherapy in order to manage pain levels with ADLs and functional mobility activities.    Time 2   Period Weeks   Status New           PT Long Term Goals - 11/25/15 1837    PT LONG TERM GOAL #1   Title Patient will report reduced R knee pain to 0-4/10 on a VAS in order to improve tolerance with ther ex and ambulation.    Time 7   Period Weeks   Status New   PT LONG TERM GOAL #2   Title Patient will demo improved R knee AAROM to 0-120 degrees in order to improve R knee mobility and ROM with stair climbing and leisure activities.    Time 7   Period Weeks   Status New   PT LONG TERM GOAL #3   Title Patient will demo improved R quad and HS strength to >4/5 MMT in order to improve performance with outdoor ambulation and transfers.    Time 7   Period Weeks   Status New   PT LONG TERM GOAL #4   Title TUG time will improve to <14 seconds without the use of a SPC in order to reduce the risk for falls.   Time 7   Period Weeks   Status New   PT LONG TERM GOAL #5   Title Patient will independently ambulate on unlevel terrain for 15 minutes without the use of an AD with improved heel to toe sequence and improved R TKE at initial contact in order to progress towards a typical gait pattern.    Time 7   Period Weeks   Status New               Plan - 12/15/15 1152    Clinical Impression Statement Patient presenting with decreased ROM, especially with knee extension. Attempting to progress during session but pain limiting amount of extension tolerated.  ROM today 4-115 degrees with assistance. The patient is also having difficulty with strength progression but advancing gradually. Will need to increase her strengh in anticipation of Lt TKA in April. Will continue with skilled PT sessions for strength, ROM and mobility.    PT Next Visit Plan Continue with ROM/strength progression, focus on knee extension and Rt LE strength (sit/stand with primarily Rt LE).    PT Home Exercise Plan Encourage knee extension   Consulted and Agree with Plan of Care Patient        Problem List Patient Active Problem List   Diagnosis Date Noted  . Primary localized osteoarthritis of right knee 11/03/2015  . DJD (degenerative joint disease) of knee 11/03/2015  . Rheumatoid arthritis (HCC)   . PONV (postoperative nausea and vomiting)   . Seizures (HCC)   . Fibromyalgia   . Sleep apnea   . Ventricular septal defect   . History of staph infection   . Refractory migraine 08/10/2014  . Complex partial epilepsy (HCC) 08/10/2014  . S/P tonsillectomy and adenoidectomy 04/01/2014  . Panic anxiety syndrome 06/03/2013  . Positive depression screening 06/03/2013  . Headache(784.0) 06/03/2013  . Unspecified cerebral artery occlusion with cerebral infarction 06/03/2013  . Headache  05/29/2013  . Patent foramen ovale 05/29/2013  . Chest pain 05/29/2013  . Other convulsions 05/29/2013  . Dysphagia, unspecified(787.20) 04/17/2013  . Acute bronchitis 03/27/2013  . Persistent headaches 03/26/2013  . CVA (cerebral infarction) history 03/26/2013  . Hypokalemia 03/26/2013  . Leukocytosis 03/26/2013  . Arthritis 03/26/2013  . QT prolongation 03/26/2013  . Stroke (HCC) 03/26/2013  . Recurrent ventral incisional hernia 04/19/2012  . Obstructive sleep apnea 08/22/2010  . RESTLESS LEGS SYNDROME 08/22/2010  . Moderate intermittent asthma without complication 08/22/2010  . ECZEMA 08/22/2010  . Irritable bowel syndrome 06/23/2010  . DIARRHEA 06/23/2010  . WEIGHT LOSS  11/01/2009  . NAUSEA WITH VOMITING 11/01/2009  . ABDOMINAL PAIN, LOWER 11/01/2009  . KNEE, ARTHRITIS, DEGEN./OSTEO 11/18/2008  . DERANGEMENT MENISCUS 11/02/2008  . JOINT EFFUSION, KNEE 11/02/2008  . KNEE PAIN 11/02/2008    Christiane Ha, PT, CSCS Pager [phone]  12/15/2015, 11:59 AM  [facility] [address] [place], [place], [address] Phone: [phone]   Fax:  [phone]  Name: [name] MRN: [id number] Date of Birth: 1960-11-19

## 2015-12-17 ENCOUNTER — Ambulatory Visit (HOSPITAL_COMMUNITY): Payer: Medicare Other | Admitting: Physical Therapy

## 2015-12-17 DIAGNOSIS — Z96651 Presence of right artificial knee joint: Secondary | ICD-10-CM

## 2015-12-17 DIAGNOSIS — M6281 Muscle weakness (generalized): Secondary | ICD-10-CM

## 2015-12-17 DIAGNOSIS — M25561 Pain in right knee: Secondary | ICD-10-CM

## 2015-12-17 DIAGNOSIS — R269 Unspecified abnormalities of gait and mobility: Secondary | ICD-10-CM

## 2015-12-17 DIAGNOSIS — M25661 Stiffness of right knee, not elsewhere classified: Secondary | ICD-10-CM

## 2015-12-17 NOTE — Therapy (Signed)
[facility] [address] [place], [place], [address] Phone: [phone]   Fax:  [phone]  Physical Therapy Treatment  Patient Details  Name: [name] MRN: [id number] Date of Birth: 03/20/1961 Referring Provider: Dr. Robert Wainer  Encounter Date: 12/17/2015      PT End of Session - 12/17/15 1149    Visit Number 8   Number of Visits 18   Date for PT Re-Evaluation 12/24/15   Authorization Type Medicare/ BCBS   Authorization Time Period 11/25/2015 to 01/24/2016   Authorization - Visit Number 8   Authorization - Number of Visits 10   PT Start Time 1102   PT Stop Time 1147   PT Time Calculation (min) 45 min   Activity Tolerance Patient tolerated treatment well  knee extension stretch limted by pain   Behavior During Therapy WFL for tasks assessed/performed      Past Medical History  Diagnosis Date  . IBS (irritable bowel syndrome)     mixed  . Allergic rhinitis     uses Flonase daily as needed and takes CLaritin daily  . Eczema   . Plaque psoriasis   . Cerebral vascular malformation     sees dr jenkins for monitoring as needed, sees dr lewitt for headaches every 4 months  . Lactose intolerance   . Anxiety     takes Xanax daily as needed  . Asthma     Albuterol inhaler prn;SYmbicort daily  . Depression     takes Citalopram daily  . Rheumatoid arthritis(714.0) 2010    oa and ra;Rhemicade IV every 6wks and Metotrexate weekly  . Nausea     takes Zofran daily as needed  . GERD (gastroesophageal reflux disease)     takes Protonix daily  . Hyperlipidemia     takes Pravastatin daily  . PONV (postoperative nausea and vomiting)   . Shortness of breath     with exertion  . Pneumonia 20yrs ago    hx of  . History of bronchitis as a child   . History of migraine     last one 10+yrs ago  . Stroke (HCC) 03/26/2013    left sided weakness  . Joint pain   . Joint swelling   . Fibromyalgia   . Diverticulitis at age 19  .  History of kidney stones   . Thyroid cyst   . Insomnia   . History of staph infection 5yrs ago  . Ventricular septal defect     2014 TEE lists large right to left shunt ASLauBritta MccCorB295enAdvanced78ShPhMarland KitchenLMarlandLauBritta MccCorB2Advanced7PhMarland KitchenBMarland KitchenaMarland KiLauBritta MccCorB295enMiAdvanced78SMarland KitchenhMarland KitchenPMarland KitLauBrittaAdvanced78ShawniMarland KitcheneMarland KitchenJMarlaLauBritta MccCoAdvanced78ShaPhKMarland KitcheniMarland KitchenlMarland KitLauBritta AdvaPhWMarland KitchenhMarland KiLauBrittAdvancedPhMarland KitchenMMarland KitchenaMarland KiLauBritta MccCorB295enPresence [facility] DbaAdvanced78ShawnieBathPhWMarland KitcheneMarland KitchensMarland KitcCurLauBritta MccCoAdvanced78ShaMarland KitchenPMarland KitchenhMarlaLauBritta MccCorB295enMid - Jefferson ExtenAdvanced7Marland KitchenPMarland KitchenhMarland KitcCurtSouthwest Medical Associates Inc Dba LauBritta MccCorB295enAdvanced78ShawnieOhPhDMarland KitcheneMarland KitchenrMarland KitcCurtCinLauBritta MccCorB295enSt. Luke'S CornwaAdvanced78ShawnieHumboldMarland KitchenPMarland KitLauBritta MccCorB295enCAdvanced78ShawnieUnMarland KitcheniMarland KitchenvMarlandLauBritta MccCorB2Advanced78ShawnieVPhEckhMarland KitchenaMarland KitchenrMarland KitcCurtHealth AllLauBritta MccCorB295enSurgAdvanced78ShawPhBurMarland KitchenfMarland KitchenoMarlandLauBritta MccCorB295enFonAdvanced78ShawnieHolmPhLakeMarland Kitchen Marland KitLauBritta Advanced78ShawnieHoMarland KitchenlMarland KitchenyMLauBritta MccCAdvanced78ShawnieStony PhSMarland KitchenaMarland KitchennMarland KLauBritta MccCorB295enWAdvanced78ShaPhPeoriMarland KitchenaMarland Kitchen MaLauBritta MccCorB29Advanced78ShawnPMarland KitchenhMarland KitchenHMarland KLauBritta MccCorB295enAdvanced78ShawnieWheaton FranciscaPhLemonMarland KitchentMarland Kitchen LauBritta MccCorB295enEAdvanced78ShawnieWilliam BPhHancocMarland KitchenkMarland KitchensMarlanLauBritta MccCorB295enUniversity OAdvanced78ShawnieSt Joseph CMarland KitchenoMarland KitchenPMarlanLauBritta MccAdvancPhFoMarland KitchenrMarland KitcheneMarland KitcCurtProvidenceLauBritta MccCorB29Advanced78ShawnieAdvanced SurPhMarland KitchenEMarland KitchenlMarland KitcCurtDoctors Outpatient Center For SurgeryKorea Inci[address] ManhattanJDW[phone] LLCrd2Ecologistnteri261 CarriageJaroBurmDavonnaSantiago916mo 7    Past Surgical History  Procedure Laterality Date  . Incontinence surgery  2010    sling done   . Knee arthroscopy  1992    left  . Foot surgery  1999    right ankle  . Kidney stone surgery  2001  . Appendectomy  1981  . Cholecystectomy  1981  . Tubal ligation  1990  . Total abdominal hysterectomy  2001  . Hernia repair  2006  . Left foot plating and scarping for arthritis  2011  . Right ear tube insertion  2011  . Incisional hernia repair  05/20/2012    Procedure: HERNIA REPAIR INCISIONAL;  Surgeon: Thomas  Liberty Handy, MD;  Location: [facility] ORS;  Service: General;  Laterality: N/A;  . Tee without cardioversion N/A 05/15/2013    Procedure: TRANSESOPHAGEAL ECHOCARDIOGRAM (TEE);  Surgeon: Pamella Pert, MD;  Location: [facility] ENDOSCOPY;  Service: Cardiovascular;  Laterality: N/A;  . Cardiac catheterization  2004  . Colonoscopy    . Thryoid biopsy    . Tonsillectomy and adenoidectomy  04/01/2014  . Tonsillectomy and adenoidectomy Bilateral 04/01/2014    Procedure: BILATERAL TONSILLECTOMY AND ADENOIDECTOMY;  Surgeon: Darletta Moll, MD;  Location: [facility] OR;  Service: ENT;  Laterality: Bilateral;  . Total knee arthroplasty Right 11/03/2015  . Total knee arthroplasty Right 11/03/2015    Procedure: RIGHT TOTAL KNEE ARTHROPLASTY/RIGHT;  Surgeon: Salvatore Marvel, MD;  Location: [facility] OR;  Service: Orthopedics;  Laterality: Right;    There were no vitals filed for this visit.  Visit Diagnosis:  Status post total right knee replacement  Stiffness of knee joint, right  Muscle weakness  Pain in joint  of right knee  Abnormality of gait      Subjective Assessment - 12/17/15 1105    Subjective Was in the car for a long time yesterday and the knee is more stiff and sore today now.    Currently in Pain? Yes   Pain Score 3    Pain Location Knee   Pain Orientation Right   Pain Descriptors / Indicators Aching  stinging   Pain Type Surgical pain   Pain Onset More than a month ago                         [facility] Adult PT Treatment/Exercise - 12/17/15 0001    Knee/Hip Exercises: Stretches   Passive Hamstring Stretch Right;3 reps;30 seconds   Knee/Hip Exercises: Aerobic   Recumbent Bike L0, X6 min forward/backward - seat 11-8   Knee/Hip Exercises: Standing   Heel Raises Both;15 reps   Lateral Step Up 1 set;15 reps;Step Height: 6"   Forward Step Up Right;15 reps;Step Height: 6"   Knee/Hip Exercises: Supine   Quad Sets Right;15 reps;2 sets   Short Arc Quad Sets Strengthening;Right;1 set;15 reps   Knee Extension AAROM   Knee Extension Limitations 4 degrees supine with overpressure   Knee Flexion AAROM   Knee Flexion Limitations 120 seated knee flexion with overpressure   Manual Therapy   Joint Mobilization R patellar mobs in S<>I direction using grade 2-3 in long sitting                PT Education - 12/17/15 1148    Education provided Yes   Education Details continue to encourage knee extension stretch at home. Described prone ext. stretch off edge of bed.    Person(s) Educated Patient   Methods Explanation;Demonstration   Comprehension Verbalized understanding          PT Short Term Goals - 11/25/15 1834    PT SHORT TERM GOAL #1   Title Patient will be independent with TKA protocol HEP in order to further progress with ROM and strength at home.   Time 2   Period Weeks   Status New   PT SHORT TERM GOAL #2   Title Patient will report reduced max R knee pain to 6/10 on a VAS in order to improve tolerance with ther ex and ambulation.    Time 3    Period Weeks   Status New   PT SHORT TERM GOAL #3   Title Patient will demo improved  R knee AAROM to 0-105 degrees in order to improve R knee mobility and ROM with ambulation and transfers.    Time 3   Period Weeks   Status New   PT SHORT TERM GOAL #4   Title Patient will independently verbalize pain management strategies including use of cryotherapy in order to manage pain levels with ADLs and functional mobility activities.    Time 2   Period Weeks   Status New           PT Long Term Goals - 11/25/15 1837    PT LONG TERM GOAL #1   Title Patient will report reduced R knee pain to 0-4/10 on a VAS in order to improve tolerance with ther ex and ambulation.    Time 7   Period Weeks   Status New   PT LONG TERM GOAL #2   Title Patient will demo improved R knee AAROM to 0-120 degrees in order to improve R knee mobility and ROM with stair climbing and leisure activities.    Time 7   Period Weeks   Status New   PT LONG TERM GOAL #3   Title Patient will demo improved R quad and HS strength to >4/5 MMT in order to improve performance with outdoor ambulation and transfers.    Time 7   Period Weeks   Status New   PT LONG TERM GOAL #4   Title TUG time will improve to <14 seconds without the use of a SPC in order to reduce the risk for falls.   Time 7   Period Weeks   Status New   PT LONG TERM GOAL #5   Title Patient will independently ambulate on unlevel terrain for 15 minutes without the use of an AD with improved heel to toe sequence and improved R TKE at initial contact in order to progress towards a typical gait pattern.    Time 7   Period Weeks   Status New               Plan - 12/17/15 1150    Clinical Impression Statement Patient is making steady progress with strength and knee flexion. Continues to struggle with knee extension. Attempting multiple exercises and techniques to encourage knee extension progress. Patient able to achieve 4 degrees from extension with  overpressure. Will continue to advance as tolerated.    PT Next Visit Plan Continue with ROM/strength progression, focus on knee extension.   PT Home Exercise Plan Attempt prone knee extension stretch off edge of bed.    Consulted and Agree with Plan of Care Patient        Problem List Patient Active Problem List   Diagnosis Date Noted  . Primary localized osteoarthritis of right knee 11/03/2015  . DJD (degenerative joint disease) of knee 11/03/2015  . Rheumatoid arthritis (HCC)   . PONV (postoperative nausea and vomiting)   . Seizures (HCC)   . Fibromyalgia   . Sleep apnea   . Ventricular septal defect   . History of staph infection   . Refractory migraine 08/10/2014  . Complex partial epilepsy (HCC) 08/10/2014  . S/P tonsillectomy and adenoidectomy 04/01/2014  . Panic anxiety syndrome 06/03/2013  . Positive depression screening 06/03/2013  . Headache(784.0) 06/03/2013  . Unspecified cerebral artery occlusion with cerebral infarction 06/03/2013  . Headache 05/29/2013  . Patent foramen ovale 05/29/2013  . Chest pain 05/29/2013  . Other convulsions 05/29/2013  . Dysphagia, unspecified(787.20) 04/17/2013  . Acute bronchitis 03/27/2013  .  Persistent headaches 03/26/2013  . CVA (cerebral infarction) history 03/26/2013  . Hypokalemia 03/26/2013  . Leukocytosis 03/26/2013  . Arthritis 03/26/2013  . QT prolongation 03/26/2013  . Stroke (HCC) 03/26/2013  . Recurrent ventral incisional hernia 04/19/2012  . Obstructive sleep apnea 08/22/2010  . RESTLESS LEGS SYNDROME 08/22/2010  . Moderate intermittent asthma without complication 08/22/2010  . ECZEMA 08/22/2010  . Irritable bowel syndrome 06/23/2010  . DIARRHEA 06/23/2010  . WEIGHT LOSS 11/01/2009  . NAUSEA WITH VOMITING 11/01/2009  . ABDOMINAL PAIN, LOWER 11/01/2009  . KNEE, ARTHRITIS, DEGEN./OSTEO 11/18/2008  . DERANGEMENT MENISCUS 11/02/2008  . JOINT EFFUSION, KNEE 11/02/2008  . KNEE PAIN 11/02/2008    Christiane Ha, PT, CSCS Pager [phone]  12/17/2015, 11:54 AM  [place] [facility] [address] [place], [place], [address] Phone: [phone]   Fax:  [phone]  Name: [name] MRN: [id number] Date of Birth: 08-10-1961

## 2015-12-20 ENCOUNTER — Ambulatory Visit (HOSPITAL_COMMUNITY): Payer: Medicare Other | Admitting: Physical Therapy

## 2015-12-20 DIAGNOSIS — M25661 Stiffness of right knee, not elsewhere classified: Secondary | ICD-10-CM

## 2015-12-20 DIAGNOSIS — Z96651 Presence of right artificial knee joint: Secondary | ICD-10-CM | POA: Diagnosis not present

## 2015-12-20 DIAGNOSIS — M25561 Pain in right knee: Secondary | ICD-10-CM

## 2015-12-20 DIAGNOSIS — M6281 Muscle weakness (generalized): Secondary | ICD-10-CM

## 2015-12-20 DIAGNOSIS — R269 Unspecified abnormalities of gait and mobility: Secondary | ICD-10-CM

## 2015-12-20 NOTE — Therapy (Signed)
[facility] [address] [place], [place], [address] Phone: [phone]   Fax:  [phone]  Physical Therapy Treatment  Patient Details  Name: [name] MRN: [id number] Date of Birth: 1960/12/29 Referring Provider: Dr. Salvatore Marvel  Encounter Date: 12/20/2015      PT End of Session - 12/20/15 1223    Visit Number 9   Number of Visits 18   Date for PT Re-Evaluation 12/24/15   Authorization Type Medicare/ BCBS   Authorization Time Period 11/25/2015 to 01/24/2016   Authorization - Visit Number 9   Authorization - Number of Visits 10   PT Start Time 1018   PT Stop Time 1106   PT Time Calculation (min) 48 min   Activity Tolerance Patient tolerated treatment well  knee extension stretch limted by pain   Behavior During Therapy [facility] for tasks assessed/performed      Past Medical History  Diagnosis Date  . IBS (irritable bowel syndrome)     mixed  . Allergic rhinitis     uses Flonase daily as needed and takes CLaritin daily  . Eczema   . Plaque psoriasis   . Cerebral vascular malformation     sees dr Lovell Sheehan for monitoring as needed, sees dr lewitt for headaches every 4 months  . Lactose intolerance   . Anxiety     takes Xanax daily as needed  . Asthma     Albuterol inhaler prn;SYmbicort daily  . Depression     takes Citalopram daily  . Rheumatoid arthritis(714.0) 2010    oa and ra;Rhemicade IV every 6wks and Metotrexate weekly  . Nausea     takes Zofran daily as needed  . GERD (gastroesophageal reflux disease)     takes Protonix daily  . Hyperlipidemia     takes Pravastatin daily  . PONV (postoperative nausea and vomiting)   . Shortness of breath     with exertion  . Pneumonia 46yrs ago    hx of  . History of bronchitis as a child   . History of migraine     last one 10+yrs ago  . Stroke (HCC) 03/26/2013    left sided weakness  . Joint pain   . Joint swelling   . Fibromyalgia   . Diverticulitis at age 71  .  History of kidney stones   . Thyroid cyst   . Insomnia   . History of staph infection 78yrs ago  . Ventricular septal defect     2014 TEE lists large right to left shunt ASD/PFO (NO VSD mentioned)  . Sleep apnea     study done >33yrs ago;uses CPAP nightly  . PFO (patent foramen ovale)   . Seizures (HCC) 6months ago 03/21/14    takes Depakote daily  . Primary localized osteoarthritis of right knee 11/03/2015    Past Surgical History  Procedure Laterality Date  . Incontinence surgery  2010    sling done   . Knee arthroscopy  1992    left  . Foot surgery  1999    right ankle  . Kidney stone surgery  2001  . Appendectomy  1981  . Cholecystectomy  1981  . Tubal ligation  1990  . Total abdominal hysterectomy  2001  . Hernia repair  2006  . Left foot plating and scarping for arthritis  2011  . Right ear tube insertion  2011  . Incisional hernia repair  05/20/2012    Procedure: HERNIA REPAIR INCISIONAL;  Surgeon: Maisie Fus  Liberty Handy, MD;  Location: [facility] ORS;  Service: General;  Laterality: N/A;  . Tee without cardioversion N/A 05/15/2013    Procedure: TRANSESOPHAGEAL ECHOCARDIOGRAM (TEE);  Surgeon: Pamella Pert, MD;  Location: [facility] ENDOSCOPY;  Service: Cardiovascular;  Laterality: N/A;  . Cardiac catheterization  2004  . Colonoscopy    . Thryoid biopsy    . Tonsillectomy and adenoidectomy  04/01/2014  . Tonsillectomy and adenoidectomy Bilateral 04/01/2014    Procedure: BILATERAL TONSILLECTOMY AND ADENOIDECTOMY;  Surgeon: Darletta Moll, MD;  Location: [facility] OR;  Service: ENT;  Laterality: Bilateral;  . Total knee arthroplasty Right 11/03/2015  . Total knee arthroplasty Right 11/03/2015    Procedure: RIGHT TOTAL KNEE ARTHROPLASTY/RIGHT;  Surgeon: Salvatore Marvel, MD;  Location: [facility] OR;  Service: Orthopedics;  Laterality: Right;    There were no vitals filed for this visit.  Visit Diagnosis:  Status post total right knee replacement  Stiffness of knee joint, right  Muscle weakness  Pain in joint  of right knee  Abnormality of gait      Subjective Assessment - 12/20/15 1022    Subjective PT states she has no pain in her knee, only in her Rt hip.   Currently in Pain? No/denies                         [facility] Adult PT Treatment/Exercise - 12/20/15 1023    Knee/Hip Exercises: Stretches   Passive Hamstring Stretch Right;3 reps;30 seconds   Passive Hamstring Stretch Limitations standing 12" box   Knee: Self-Stretch to increase Flexion Right;10 seconds   Knee: Self-Stretch Limitations 10 reps on 12" box   Gastroc Stretch Both;3 reps;30 seconds   Gastroc Stretch Limitations slantboard    Knee/Hip Exercises: Aerobic   Recumbent Bike L0, X8 min forward/backward - seat 11-8   Knee/Hip Exercises: Standing   Heel Raises Both;15 reps   Forward Lunges Both;10 reps   Lateral Step Up Right;15 reps;Step Height: 4";Hand Hold: 1   Forward Step Up Right;15 reps;Step Height: 6";Hand Hold: 1   Knee/Hip Exercises: Supine   Quad Sets Right;15 reps;2 sets   Heel Slides AAROM;Right;1 set;10 reps   Heel Slides Limitations assist at end range   Knee Extension AAROM   Knee Extension Limitations 4 degrees supine with overpressure   Knee Flexion AAROM   Knee Flexion Limitations 122 supine knee flexion with overpressure   Manual Therapy   Manual Therapy Myofascial release   Manual therapy comments completed at end of session following therex.  Prone to posterior knee with knee hangs   Joint Mobilization R patellar mobs in S<>I direction using grade 2-3 in long sitting   Passive ROM R knee into extension and flexion x 3 reps with 5 sec hold                  PT Short Term Goals - 11/25/15 1834    PT SHORT TERM GOAL #1   Title Patient will be independent with TKA protocol HEP in order to further progress with ROM and strength at home.   Time 2   Period Weeks   Status New   PT SHORT TERM GOAL #2   Title Patient will report reduced max R knee pain to 6/10 on a VAS in order  to improve tolerance with ther ex and ambulation.    Time 3   Period Weeks   Status New   PT SHORT TERM GOAL #3   Title Patient will  demo improved R knee AAROM to 0-105 degrees in order to improve R knee mobility and ROM with ambulation and transfers.    Time 3   Period Weeks   Status New   PT SHORT TERM GOAL #4   Title Patient will independently verbalize pain management strategies including use of cryotherapy in order to manage pain levels with ADLs and functional mobility activities.    Time 2   Period Weeks   Status New           PT Long Term Goals - 11/25/15 1837    PT LONG TERM GOAL #1   Title Patient will report reduced R knee pain to 0-4/10 on a VAS in order to improve tolerance with ther ex and ambulation.    Time 7   Period Weeks   Status New   PT LONG TERM GOAL #2   Title Patient will demo improved R knee AAROM to 0-120 degrees in order to improve R knee mobility and ROM with stair climbing and leisure activities.    Time 7   Period Weeks   Status New   PT LONG TERM GOAL #3   Title Patient will demo improved R quad and HS strength to >4/5 MMT in order to improve performance with outdoor ambulation and transfers.    Time 7   Period Weeks   Status New   PT LONG TERM GOAL #4   Title TUG time will improve to <14 seconds without the use of a SPC in order to reduce the risk for falls.   Time 7   Period Weeks   Status New   PT LONG TERM GOAL #5   Title Patient will independently ambulate on unlevel terrain for 15 minutes without the use of an AD with improved heel to toe sequence and improved R TKE at initial contact in order to progress towards a typical gait pattern.    Time 7   Period Weeks   Status New               Plan - 12/20/15 1224    Clinical Impression Statement Patient continues to make gains with improving ROM.  Added prone knee hang wtih manual completed posterior LE.  AROM for extension today 4 degrees and flexion to 122 degrees.  Pt  encouraged to complete prone knee hang at home as well to improve extension.  Decreased lateral step up to 4" due to hip substitution.  Also added forward step downs to work on improving eccentric strength.     PT Next Visit Plan Continue with ROM/strength progression, focus on knee extension.  Gcodes next session.   PT Home Exercise Plan Attempt prone knee extension stretch off edge of bed.    Consulted and Agree with Plan of Care Patient        Problem List Patient Active Problem List   Diagnosis Date Noted  . Primary localized osteoarthritis of right knee 11/03/2015  . DJD (degenerative joint disease) of knee 11/03/2015  . Rheumatoid arthritis (HCC)   . PONV (postoperative nausea and vomiting)   . Seizures (HCC)   . Fibromyalgia   . Sleep apnea   . Ventricular septal defect   . History of staph infection   . Refractory migraine 08/10/2014  . Complex partial epilepsy (HCC) 08/10/2014  . S/P tonsillectomy and adenoidectomy 04/01/2014  . Panic anxiety syndrome 06/03/2013  . Positive depression screening 06/03/2013  . Headache(784.0) 06/03/2013  . Unspecified cerebral artery occlusion with cerebral infarction  06/03/2013  . Headache 05/29/2013  . Patent foramen ovale 05/29/2013  . Chest pain 05/29/2013  . Other convulsions 05/29/2013  . Dysphagia, unspecified(787.20) 04/17/2013  . Acute bronchitis 03/27/2013  . Persistent headaches 03/26/2013  . CVA (cerebral infarction) history 03/26/2013  . Hypokalemia 03/26/2013  . Leukocytosis 03/26/2013  . Arthritis 03/26/2013  . QT prolongation 03/26/2013  . Stroke (HCC) 03/26/2013  . Recurrent ventral incisional hernia 04/19/2012  . Obstructive sleep apnea 08/22/2010  . RESTLESS LEGS SYNDROME 08/22/2010  . Moderate intermittent asthma without complication 08/22/2010  . ECZEMA 08/22/2010  . Irritable bowel syndrome 06/23/2010  . DIARRHEA 06/23/2010  . WEIGHT LOSS 11/01/2009  . NAUSEA WITH VOMITING 11/01/2009  . ABDOMINAL PAIN,  LOWER 11/01/2009  . KNEE, ARTHRITIS, DEGEN./OSTEO 11/18/2008  . DERANGEMENT MENISCUS 11/02/2008  . JOINT EFFUSION, KNEE 11/02/2008  . KNEE PAIN 11/02/2008    Lurena Nida, PTA/CLT [phone]  12/20/2015, 12:29 PM  [place] [facility] [address] [place], [place], [address] Phone: [phone]   Fax:  [phone]  Name: [name] MRN: [id number] Date of Birth: August 14, 1961

## 2015-12-22 ENCOUNTER — Ambulatory Visit (HOSPITAL_COMMUNITY): Payer: Medicare Other

## 2015-12-22 DIAGNOSIS — Z96651 Presence of right artificial knee joint: Secondary | ICD-10-CM

## 2015-12-22 DIAGNOSIS — M25661 Stiffness of right knee, not elsewhere classified: Secondary | ICD-10-CM

## 2015-12-22 DIAGNOSIS — M6281 Muscle weakness (generalized): Secondary | ICD-10-CM

## 2015-12-22 DIAGNOSIS — R269 Unspecified abnormalities of gait and mobility: Secondary | ICD-10-CM

## 2015-12-22 DIAGNOSIS — M25561 Pain in right knee: Secondary | ICD-10-CM

## 2015-12-22 NOTE — Therapy (Signed)
[place], [place], [address] Phone: [phone]   Fax:  [phone]  Physical Therapy Treatment/ 10-day Progress Note   Patient Details  Name: [name] MRN: [id number] Date of Birth: December 07, 1960 Referring Provider: Dr. Elsie Saas   Encounter Date: 12/22/2015      PT End of Session - 12/22/15 1122    Visit Number 10   Number of Visits 18   Date for PT Re-Evaluation 01/21/16   Authorization Type Medicare/ BCBS ( G-codes updated on November 29, 2022 visit)   Authorization Time Period 11/25/2015 to 01/24/2016   Authorization - Visit Number 10   Authorization - Number of Visits 10   PT Start Time 4492   PT Stop Time 1149   PT Time Calculation (min) 46 min   Activity Tolerance Patient limited by pain;Patient tolerated treatment well   Behavior During Therapy [facility] for tasks assessed/performed      Past Medical History  Diagnosis Date  . IBS (irritable bowel syndrome)     mixed  . Allergic rhinitis     uses Flonase daily as needed and takes CLaritin daily  . Eczema   . Plaque psoriasis   . Cerebral vascular malformation     sees dr Arnoldo Morale for monitoring as needed, sees dr lewitt for headaches every 4 months  . Lactose intolerance   . Anxiety     takes Xanax daily as needed  . Asthma     Albuterol inhaler prn;SYmbicort daily  . Depression     takes Citalopram daily  . Rheumatoid arthritis(714.0) 2010    oa and ra;Rhemicade IV every 6wks and Metotrexate weekly  . Nausea     takes Zofran daily as needed  . GERD (gastroesophageal reflux disease)     takes Protonix daily  . Hyperlipidemia     takes Pravastatin daily  . PONV (postoperative nausea and vomiting)   . Shortness of breath     with exertion  . Pneumonia 29yr ago    hx of  . History of bronchitis as a child   . History of migraine     last one 10+yrs ago  . [place]    left sided weakness  . Joint pain   . Joint swelling   .  Fibromyalgia   . Diverticulitis at age 55 . History of kidney stones   . Thyroid cyst   . Insomnia   . History of staph infection [place]yrago  . Ventricular septal defect     2014 TEE lists large right to left shunt ASD/PFO (NO VSD mentioned)  . Sleep apnea     study done >5y25yrgo;uses CPAP nightly  . PFO (patent foramen ovale)   . [place] [place] 03[place]21[place]    takes Depakote daily  . Primary localized osteoarthritis of right knee 11/03/2015    Past Surgical History  Procedure Laterality Date  . Incontinence surgery  2010    sling done   . Knee arthroscopy  1992    left  . Foot surgery  1999    right ankle  . Kidney stone surgery  2001  . Appendectomy  1981  . Cholecystectomy  1981  . Tubal ligation  1990  . Total abdominal hysterectomy  2001  . Hernia repair  2006  . Left foot plating and scarping for arthritis  2011  . Right ear tube insertion  2011  . Incisional hernia repair  05/20/2012  Procedure: HERNIA REPAIR INCISIONAL;  Surgeon: Joyice Faster. Cornett, MD;  Location: [facility];  Service: General;  Laterality: N/A;  . Tee without cardioversion N/A 05/15/2013    Procedure: TRANSESOPHAGEAL ECHOCARDIOGRAM (TEE);  Surgeon: Laverda Page, MD;  Location: [place];  Service: Cardiovascular;  Laterality: N/A;  . Cardiac catheterization  2004  . Colonoscopy    . Thryoid biopsy    . Tonsillectomy and adenoidectomy  04/01/2014  . Tonsillectomy and adenoidectomy Bilateral 04/01/2014    Procedure: BILATERAL TONSILLECTOMY AND ADENOIDECTOMY;  Surgeon: Ascencion Dike, MD;  Location: [place];  Service: ENT;  Laterality: Bilateral;  . Total knee arthroplasty Right 11/03/2015  . Total knee arthroplasty Right 11/03/2015    Procedure: RIGHT TOTAL KNEE ARTHROPLASTY/RIGHT;  Surgeon: Elsie Saas, MD;  Location: [place];  Service: Orthopedics;  Laterality: Right;    There were no vitals filed for this visit.  Visit Diagnosis:  Status post total right knee replacement  Stiffness of knee  joint, right  Muscle weakness  Abnormality of gait  Pain in joint of right knee      Subjective Assessment - 12/22/15 1116    Subjective Pt noted that her R knee is "doing much better" with less severe pain levels reported. R knee pain was rated a 1/10 on a VAS upon arrival. Pt reported an 85% improvement since beginning with PT. She continues to c/o R knee pain with R TKE.     Pertinent History CVA, DJD, RA, chest pain, and seizures    Limitations Standing;Walking   How long can you sit comfortably? fatigue with > 15 minutes    How long can you stand comfortably? fatigue with > 15 minutes    How long can you walk comfortably? fatigue with > 15 minutes    Diagnostic tests N/A   Patient Stated Goals Pt's main goal is to improve her strength, improve her general mobility, and return to leisure activities including hiking and swimming   Currently in Pain? Yes   Pain Score 1   R knee pain has ranged between a 0-8/10 on a VAS within the last week    Pain Location Knee   Pain Orientation Right   Pain Descriptors / Indicators Aching   Pain Type Surgical pain   Pain Onset More than a month ago   Pain Frequency Intermittent   Aggravating Factors  max R knee flexion and extension, prolonged standing, and while sleeping    Pain Relieving Factors change in position and R LE elevation   Effect of Pain on Daily Activities long distance ambulation    Multiple Pain Sites No            [facility] PT Assessment - 12/22/15 0001    Assessment   Medical Diagnosis s/p R TKA   Referring Provider Dr. Elsie Saas    Onset Date/Surgical Date 11/03/15   Hand Dominance Right   Next MD Visit January 29, 2016   Prior Therapy Home health physical therapy post op    Precautions   Precautions Fall   Restrictions   Weight Bearing Restrictions No   [place] residence   Living Arrangements Spouse/significant other   Available Help at Discharge Family   Type of [place] to enter   Entrance Stairs-Number of Steps 3   [place] level   [place] - 2 wheels;Cane - single point;Other (comment);Bedside commode;Shower seat  CPM   Prior Function       Vocation On disability   Leisure hiking, walking, and swimming    Cognition   Overall Cognitive Status Within Functional Limits for tasks assessed   Attention Focused   Focused Attention Appears intact   Observation/Other Assessments-Edema    Edema Circumferential  Joint line= R knee= 40.0 cm ; L knee= 39.0 cm    AROM   Right Knee Extension 7   Right Knee Flexion 119   PROM   Right Knee Extension 5   Right Knee Flexion 124   Strength   Right Hip Flexion 4/5   Right Hip Extension 4/5   Right Hip ABduction 4/5   Left Hip Flexion 4+/5   Left Hip Extension 4+/5   Left Hip ABduction 4+/5   Right Knee Flexion 4-/5   Right Knee Extension 4/5   Left Knee Flexion 5/5   Left Knee Extension 5/5   Right Ankle Dorsiflexion 5/5   Right Ankle Plantar Flexion 4/5   Left Ankle Dorsiflexion 5/5   Left Ankle Plantar Flexion 4/5   Flexibility   Soft Tissue Assessment /Muscle Length yes   Hamstrings minor limitations, bilateral    Palpation   Patella mobility Minor to moderate limitations with R patellar mobility in S<>I direction    Ambulation/Gait   Ambulation/Gait Yes   Ambulation/Gait Assistance 6: Modified independent (Device/Increase time)   Ambulation Distance (Feet) 250 Feet   Assistive device Straight cane   Gait Pattern Step-through pattern;Antalgic  Limited R TKE at initial contact    Ambulation Surface Level   Timed Up and Go Test   Normal TUG (seconds) 14  with single point cane                      [facility] Adult PT Treatment/Exercise - 12/22/15 0001    Knee/Hip Exercises: Stretches   Passive Hamstring Stretch Right;3 reps;30 seconds   Passive Hamstring Stretch Limitations supine- manually    Knee:  Self-Stretch to increase Flexion Right;10 seconds   Knee: Self-Stretch Limitations 10 reps on 12" box   Knee/Hip Exercises: Aerobic   Recumbent Bike L0, X8 min forward- seat 11   Knee/Hip Exercises: Seated   Long Arc Quad Strengthening;Right;20 reps   Knee/Hip Exercises: Supine   Quad Sets Right;15 reps;2 sets   Heel Slides AAROM;Right;1 set;10 reps   Heel Slides Limitations assist at end range   Knee Extension AAROM   Knee Extension Limitations 5 deg   Knee Flexion AAROM   Knee Flexion Limitations 124 deg   Knee/Hip Exercises: Prone   Prone Knee Hang 4 minutes                PT Education - 12/22/15 1121    Education provided Yes   Education Details Educated/instructed pt on updated MMT/ROM measurements, HEP, and prone hang to improve R TKE   Person(s) Educated Patient   Methods Explanation   Comprehension Verbalized understanding;Returned demonstration          PT Short Term Goals - 12/22/15 1139    PT SHORT TERM GOAL #1   Title Patient will be independent with TKA protocol HEP in order to further progress with ROM and strength at home.   Time 2   Period Weeks   Status Achieved   PT SHORT TERM GOAL #2   Title Patient will report reduced max R knee pain to 6/10 on a VAS in order to improve tolerance with ther ex  and ambulation.    Baseline R knee pain ranges between a 0-8/10 on a VAS    Time 3   Period Weeks   Status On-going   PT SHORT TERM GOAL #3   Title Patient will demo improved R knee AAROM to 0-105 degrees in order to improve R knee mobility and ROM with ambulation and transfers.    Baseline R knee AAROM=5-124 degrees    Time 3   Period Weeks   Status On-going   PT SHORT TERM GOAL #4   Title Patient will independently verbalize pain management strategies including use of cryotherapy in order to manage pain levels with ADLs and functional mobility activities.    Time 2   Period Weeks   Status Achieved           PT Long Term Goals - 12/22/15  1141    PT LONG TERM GOAL #1   Title Patient will report reduced R knee pain to 0-4/10 on a VAS in order to improve tolerance with ther ex and ambulation.    Baseline R knee pain ranges between a 0-8/10 on a VAS, but pain levels are mainly exacerbated with max R knee flexion and extension;    Time 7   Period Weeks   Status On-going   PT LONG TERM GOAL #2   Title Patient will demo improved R knee AAROM to 0-120 degrees in order to improve R knee mobility and ROM with stair climbing and leisure activities.    Time 7   Period Weeks   Status On-going   PT LONG TERM GOAL #3   Title Patient will demo improved R quad and HS strength to >4/5 MMT in order to improve performance with outdoor ambulation and transfers.    Baseline R HS strength=4-/5 MMT; R quad strength= 4/5 MMT    Time 7   Period Weeks   Status On-going   PT LONG TERM GOAL #4   Title TUG time will improve to <14 seconds without the use of a SPC in order to reduce the risk for falls.   Baseline TUG with SPC=14 seconds    Time 7   Period Weeks   Status Achieved   PT LONG TERM GOAL #5   Title Patient will independently ambulate on unlevel terrain for 15 minutes without the use of an AD with improved heel to toe sequence and improved R TKE at initial contact in order to progress towards a typical gait pattern.    Time 7   Period Weeks   Status On-going               Plan - 12/22/15 1123    Clinical Impression Statement Completed 10-day progress note this visit. Mrs. Newsom is a 55 yo female who has been seen for a total of 10 PT visits s/p R TKA. The pt has made steady progress towards stated goals with improved R knee ROM, strength, decreased R knee pain, improved balance, and improved heel to toe gait pattern with and without an AD. The pt has met 2/4 STG and 1/5 LTG. Goals continue to be appropriate for desired outcomes with no goals added this visit. Pt continues to present with limited R knee extension AROM/PROM,  which was measured to 5-124 degrees. In addition, she continues to present with strength deficits of the R quad and HS as documented. TUG time improved to 14 sec with good balance assessed. Pt is able to ambulate without an AD, but continues to  present with a minor antalgic gait pattern with limited R TKE observed at initial contact. Her pain levels are well managed at this time, but she continues to experience moderate to high pain levels with max R knee extension and flexion. The pt would benefit from continued skilled PT with current POC to address remaining deficits. The pt is in agreement with continued skilled PT. Today's PT tx was focused on R knee AROM/AAROM ther ex in order to improve R knee mobility.  Fair tolerance reported with ther ex with increased R knee pain reported with prone knee hang ther ex. Symptoms were reduced to baseline level at the completion of PT tx. Reviewed current HEP with good understanding verbalized and demo.    Pt will benefit from skilled therapeutic intervention in order to improve on the following deficits Abnormal gait;Decreased range of motion;Difficulty walking;Pain;Decreased balance;Hypomobility;Impaired flexibility;Increased edema;Decreased strength;Decreased mobility   Rehab Potential Good   PT Frequency Other (comment)  3x/week for 3 weeks and 2x/week for 4 weeks   PT Duration Other (comment)  3x/week for 3 weeks and 2x/week for 4 weeks   PT Treatment/Interventions ADLs/Self Care Home Management;Cryotherapy;Electrical Stimulation;Moist Heat;Balance training;Therapeutic exercise;Therapeutic activities;Functional mobility training;Stair training;Gait training;DME Instruction;Neuromuscular re-education;Patient/family education;Manual techniques;Taping;Passive range of motion;Scar mobilization   PT Next Visit Plan Continue with ROM/strength progression with focus on R TKE   PT Home Exercise Plan Reviewed HEP with addition of prone knee hang    Consulted and Agree  with Plan of Care Patient          G-Codes - 01/04/16 1146    Functional Assessment Tool Used Clinical findings including MMT, ROM, pain levels, TUG, and edema measurements    Functional Limitation Mobility: Walking and moving around   Mobility: Walking and Moving Around Current Status [phone]) At least 20 percent but less than 40 percent impaired, limited or restricted   Mobility: Walking and Moving Around Goal Status [phone]) At least 20 percent but less than 40 percent impaired, limited or restricted      Problem List Patient Active Problem List   Diagnosis Date Noted  . Primary localized osteoarthritis of right knee 11/03/2015  . DJD (degenerative joint disease) of knee 11/03/2015  . Rheumatoid arthritis ([place])   . PONV (postoperative nausea and vomiting)   . Seizures ([place])   . Fibromyalgia   . Sleep apnea   . Ventricular septal defect   . History of staph infection   . Refractory migraine 08/10/2014  . Complex partial epilepsy ([place]) 08/10/2014  . S/P tonsillectomy and adenoidectomy 04/01/2014  . Panic anxiety syndrome 06/03/2013  . Positive depression screening 06/03/2013  . Headache(784.0) 06/03/2013  . Unspecified cerebral artery occlusion with cerebral infarction 06/03/2013  . Headache 05/29/2013  . Patent foramen ovale 05/29/2013  . Chest pain 05/29/2013  . Other convulsions 05/29/2013  . Dysphagia, unspecified(787.20) 04/17/2013  . Acute bronchitis 03/27/2013  . Persistent headaches 03/26/2013  . CVA (cerebral infarction) history 03/26/2013  . Hypokalemia 03/26/2013  . Leukocytosis 03/26/2013  . Arthritis 03/26/2013  . QT prolongation 03/26/2013  . Stroke ([place]) 03/26/2013  . Recurrent ventral incisional hernia 04/19/2012  . Obstructive sleep apnea 08/22/2010  . RESTLESS LEGS SYNDROME 08/22/2010  . Moderate intermittent asthma without complication 27/12/5007  . ECZEMA 08/22/2010  . Irritable bowel syndrome 06/23/2010  . DIARRHEA 06/23/2010  . WEIGHT LOSS  11/01/2009  . NAUSEA WITH VOMITING 11/01/2009  . ABDOMINAL PAIN, LOWER 11/01/2009  . KNEE, ARTHRITIS, DEGEN./OSTEO 11/18/2008  . DERANGEMENT MENISCUS 11/02/2008  . JOINT EFFUSION,  KNEE 11/02/2008  . KNEE PAIN 11/02/2008    Garen Lah, PT, DPT  12/22/2015, 12:06 PM  [place] [address] [place], [place], [address] Phone: [phone]   Fax:  [phone]  Name: [name] MRN: [id number] Date of Birth: [birth year]

## 2015-12-24 ENCOUNTER — Encounter (HOSPITAL_COMMUNITY): Payer: BC Managed Care – PPO | Admitting: Physical Therapy

## 2015-12-27 ENCOUNTER — Ambulatory Visit (HOSPITAL_COMMUNITY): Payer: Medicare Other | Admitting: Physical Therapy

## 2015-12-27 DIAGNOSIS — M25561 Pain in right knee: Secondary | ICD-10-CM

## 2015-12-27 DIAGNOSIS — M25661 Stiffness of right knee, not elsewhere classified: Secondary | ICD-10-CM

## 2015-12-27 DIAGNOSIS — Z96651 Presence of right artificial knee joint: Secondary | ICD-10-CM

## 2015-12-27 DIAGNOSIS — R269 Unspecified abnormalities of gait and mobility: Secondary | ICD-10-CM

## 2015-12-27 DIAGNOSIS — M6281 Muscle weakness (generalized): Secondary | ICD-10-CM

## 2015-12-27 NOTE — Therapy (Signed)
[facility] [address] [place], [place], [address] Phone: [phone]   Fax:  [phone]  Physical Therapy Treatment  Patient Details  Name: [name] MRN: [id number] Date of Birth: [birth year] Referring Provider: Dr. Salvatore Marvel   Encounter Date: 12/27/2015      PT End of Session - 12/27/15 1202    Visit Number 11   Number of Visits 18   Date for PT Re-Evaluation 01/21/16   Authorization Type Medicare/ BCBS ( G-codes updated on 2023-02-11 visit)   Authorization Time Period 11/25/2015 to 11-Feb-2016   Authorization - Visit Number 11   Authorization - Number of Visits 20   PT Start Time 1102   PT Stop Time 1148   PT Time Calculation (min) 46 min   Activity Tolerance Patient tolerated treatment well   Behavior During Therapy [facility] for tasks assessed/performed      Past Medical History  Diagnosis Date  . IBS (irritable bowel syndrome)     mixed  . Allergic rhinitis     uses Flonase daily as needed and takes CLaritin daily  . Eczema   . Plaque psoriasis   . Cerebral vascular malformation     sees dr Lovell Sheehan for monitoring as needed, sees dr lewitt for headaches every 4 months  . Lactose intolerance   . Anxiety     takes Xanax daily as needed  . Asthma     Albuterol inhaler prn;SYmbicort daily  . Depression     takes Citalopram daily  . Rheumatoid arthritis(714.0) 2010    oa and ra;Rhemicade IV every 6wks and Metotrexate weekly  . Nausea     takes Zofran daily as needed  . GERD (gastroesophageal reflux disease)     takes Protonix daily  . Hyperlipidemia     takes Pravastatin daily  . PONV (postoperative nausea and vomiting)   . Shortness of breath     with exertion  . Pneumonia 39yrs ago    hx of  . History of bronchitis as a child   . History of migraine     last one 10+yrs ago  . Stroke (HCC) 03/26/2013    left sided weakness  . Joint pain   . Joint swelling   . Fibromyalgia   . Diverticulitis at age 73  .  History of kidney stones   . Thyroid cyst   . Insomnia   . History of staph infection 80yrs ago  . Ventricular septal defect     2014 TEE lists large right to left shunt ASD/PFO (NO VSD mentioned)  . Sleep apnea     study done >53yrs ago;uses CPAP nightly  . PFO (patent foramen ovale)   . Seizures (HCC) 23months ago 03/21/14    takes Depakote daily  . Primary localized osteoarthritis of right knee 11/03/2015    Past Surgical History  Procedure Laterality Date  . Incontinence surgery  2010    sling done   . Knee arthroscopy  1992    left  . Foot surgery  1999    right ankle  . Kidney stone surgery  2001  . Appendectomy  1981  . Cholecystectomy  1981  . Tubal ligation  1990  . Total abdominal hysterectomy  2001  . Hernia repair  2006  . Left foot plating and scarping for arthritis  2011  . Right ear tube insertion  2011  . Incisional hernia repair  05/20/2012    Procedure: HERNIA REPAIR INCISIONAL;  Surgeon: Maisie Fus  Liberty Handy, MD;  Location: [facility] ORS;  Service: General;  Laterality: N/A;  . Tee without cardioversion N/A 05/15/2013    Procedure: TRANSESOPHAGEAL ECHOCARDIOGRAM (TEE);  Surgeon: Pamella Pert, MD;  Location[facility] ENDOSCOPY;  Service: Cardiovascular;  Laterality: N/A;  . Cardiac catheterization  2004  . Colonoscopy    . Thryoid biopsy    . Tonsillectomy and adenoidectomy  04/01/2014  . Tonsillectomy and adenoidectomy Bilateral 04/01/2014    Procedure: BILATERAL TONSILLECTOMY AND ADENOIDECTOMY;  Surgeon: Darletta Moll, MD;  Location: [facility] OR;  Service: ENT;  Laterality: Bilateral;  . Total knee arthroplasty Right 11/03/2015  . Total knee arthroplasty Right 11/03/2015    Procedure: RIGHT TOTAL KNEE ARTHROPLASTY/RIGHT;  Surgeon: Salvatore Marvel, MD;  Location: [facility] OR;  Service: Orthopedics;  Laterality: Right;    There were no vitals filed for this visit.  Visit Diagnosis:  Status post total right knee replacement  Stiffness of knee joint, right  Muscle weakness  Abnormality  of gait  Pain in joint of right knee      Subjective Assessment - 12/27/15 1106    Subjective Had a rough weekend with having to go to [place]. Doing better today but was really exhausted yesterday. Feeling like her knee is getting better.    Currently in Pain? No/denies   Aggravating Factors  riding in the car for long periods of time, being up on it too long.    Pain Relieving Factors laying down. heat on hip                         [facility] Adult PT Treatment/Exercise - 12/27/15 0001    Ambulation/Gait   Stairs Yes   Stairs Assistance 5: Supervision   Height of Stairs 6   Gait Comments using bilateral rails, up 4 inch steps, down 6 inch steps   Knee/Hip Exercises: Stretches   Passive Hamstring Stretch Right;3 reps;30 seconds   Passive Hamstring Stretch Limitations standing with 12 inch step   Knee/Hip Exercises: Aerobic   Recumbent Bike L0-3 X83min   Knee/Hip Exercises: Standing   Hip ADduction Strengthening;Both;1 set;10 reps   Lateral Step Up Right;Step Height: 4";1 set;10 reps   Lateral Step Up Limitations cues to avoid compensations   Forward Step Up Right;1 set;10 reps;Step Height: 6";Hand Hold: 1   Knee/Hip Exercises: Seated   Long Arc Quad Strengthening;Right;2 sets;10 reps   Long Arc Quad Limitations manual resistance to pt tolerance   Heel Slides AROM;Right;1 set;10 reps   Knee/Hip Exercises: Supine   Quad Sets Strengthening;Right;10 reps   Quad Sets Limitations pressing into max knee extension   Heel Slides AAROM;Right;1 set;10 reps   Knee Extension AAROM   Knee Extension Limitations 4 degrees   Knee Flexion AAROM   Knee Flexion Limitations 121 degrees (supine)   Manual Therapy   Manual Therapy Myofascial release   Manual therapy comments STM to hamstring, calf, adductor musculature.                 PT Education - 12/27/15 1201    Education provided Yes   Education Details self hamstring and adductor massage for improving knee  extension   Person(s) Educated Patient   Methods Explanation;Demonstration;Verbal cues;Tactile cues   Comprehension Verbalized understanding  declined handout          PT Short Term Goals - 12/22/15 1139    PT SHORT TERM GOAL #1   Title Patient will be independent with TKA protocol HEP in  order to further progress with ROM and strength at home.   Time 2   Period Weeks   Status Achieved   PT SHORT TERM GOAL #2   Title Patient will report reduced max R knee pain to 6/10 on a VAS in order to improve tolerance with ther ex and ambulation.    Baseline R knee pain ranges between a 0-8/10 on a VAS    Time 3   Period Weeks   Status On-going   PT SHORT TERM GOAL #3   Title Patient will demo improved R knee AAROM to 0-105 degrees in order to improve R knee mobility and ROM with ambulation and transfers.    Baseline R knee AAROM=5-124 degrees    Time 3   Period Weeks   Status On-going   PT SHORT TERM GOAL #4   Title Patient will independently verbalize pain management strategies including use of cryotherapy in order to manage pain levels with ADLs and functional mobility activities.    Time 2   Period Weeks   Status Achieved           PT Long Term Goals - 12/22/15 1141    PT LONG TERM GOAL #1   Title Patient will report reduced R knee pain to 0-4/10 on a VAS in order to improve tolerance with ther ex and ambulation.    Baseline R knee pain ranges between a 0-8/10 on a VAS, but pain levels are mainly exacerbated with max R knee flexion and extension;    Time 7   Period Weeks   Status On-going   PT LONG TERM GOAL #2   Title Patient will demo improved R knee AAROM to 0-120 degrees in order to improve R knee mobility and ROM with stair climbing and leisure activities.    Time 7   Period Weeks   Status On-going   PT LONG TERM GOAL #3   Title Patient will demo improved R quad and HS strength to >4/5 MMT in order to improve performance with outdoor ambulation and transfers.     Baseline R HS strength=4-/5 MMT; R quad strength= 4/5 MMT    Time 7   Period Weeks   Status On-going   PT LONG TERM GOAL #4   Title TUG time will improve to <14 seconds without the use of a SPC in order to reduce the risk for falls.   Baseline TUG with SPC=14 seconds    Time 7   Period Weeks   Status Achieved   PT LONG TERM GOAL #5   Title Patient will independently ambulate on unlevel terrain for 15 minutes without the use of an AD with improved heel to toe sequence and improved R TKE at initial contact in order to progress towards a typical gait pattern.    Time 7   Period Weeks   Status On-going               Plan - 12/27/15 1203    Clinical Impression Statement Pt progressing with PT sessions. Continued difficulty with full knee extension. Noted significant muscle tension and guarding along medial hamstring and adductor musculature. Compensations noted as well with stepups and standing isolated strengthening activities. Including hip abductor strenthening as well for improved Rt LE stabilization with gait and single leg activities. Continue with skilled PT at this time.      PT Next Visit Plan Continue with ROM/strength progression with focus on R TKE, include hip abductor strengthening and knee extension.  PT Home Exercise Plan check standing hip abduction, pt declined handout for reference.    Consulted and Agree with Plan of Care Patient        Problem List Patient Active Problem List   Diagnosis Date Noted  . Primary localized osteoarthritis of right knee 11/03/2015  . DJD (degenerative joint disease) of knee 11/03/2015  . Rheumatoid arthritis (HCC)   . PONV (postoperative nausea and vomiting)   . Seizures (HCC)   . Fibromyalgia   . Sleep apnea   . Ventricular septal defect   . History of staph infection   . Refractory migraine 08/10/2014  . Complex partial epilepsy (HCC) 08/10/2014  . S/P tonsillectomy and adenoidectomy 04/01/2014  . Panic anxiety syndrome  06/03/2013  . Positive depression screening 06/03/2013  . Headache(784.0) 06/03/2013  . Unspecified cerebral artery occlusion with cerebral infarction 06/03/2013  . Headache 05/29/2013  . Patent foramen ovale 05/29/2013  . Chest pain 05/29/2013  . Other convulsions 05/29/2013  . Dysphagia, unspecified(787.20) 04/17/2013  . Acute bronchitis 03/27/2013  . Persistent headaches 03/26/2013  . CVA (cerebral infarction) history 03/26/2013  . Hypokalemia 03/26/2013  . Leukocytosis 03/26/2013  . Arthritis 03/26/2013  . QT prolongation 03/26/2013  . Stroke (HCC) 03/26/2013  . Recurrent ventral incisional hernia 04/19/2012  . Obstructive sleep apnea 08/22/2010  . RESTLESS LEGS SYNDROME 08/22/2010  . Moderate intermittent asthma without complication 08/22/2010  . ECZEMA 08/22/2010  . Irritable bowel syndrome 06/23/2010  . DIARRHEA 06/23/2010  . WEIGHT LOSS 11/01/2009  . NAUSEA WITH VOMITING 11/01/2009  . ABDOMINAL PAIN, LOWER 11/01/2009  . KNEE, ARTHRITIS, DEGEN./OSTEO 11/18/2008  . DERANGEMENT MENISCUS 11/02/2008  . JOINT EFFUSION, KNEE 11/02/2008  . KNEE PAIN 11/02/2008    Christiane Ha, PT, CSCS Pager [phone]  12/27/2015, 12:08 PM  [facility] [address] [place], [place], [address] Phone: [phone]   Fax:  [phone]  Name: [name] MRN: [id number] Date of Birth: Jan 25, 1961

## 2015-12-30 ENCOUNTER — Encounter (HOSPITAL_COMMUNITY): Payer: BC Managed Care – PPO

## 2016-01-03 ENCOUNTER — Ambulatory Visit (HOSPITAL_COMMUNITY): Payer: Medicare Other

## 2016-01-03 DIAGNOSIS — Z96651 Presence of right artificial knee joint: Secondary | ICD-10-CM | POA: Diagnosis not present

## 2016-01-03 DIAGNOSIS — R269 Unspecified abnormalities of gait and mobility: Secondary | ICD-10-CM

## 2016-01-03 DIAGNOSIS — M25661 Stiffness of right knee, not elsewhere classified: Secondary | ICD-10-CM

## 2016-01-03 DIAGNOSIS — M25561 Pain in right knee: Secondary | ICD-10-CM

## 2016-01-03 DIAGNOSIS — M6281 Muscle weakness (generalized): Secondary | ICD-10-CM

## 2016-01-03 NOTE — Therapy (Signed)
[place] [facility] [address] [place], [place], [address] Phone: [phone]   Fax:  [phone]  Physical Therapy Treatment  Patient Details  Name: [name] MRN: [id number] Date of Birth: 1960/12/06 Referring Provider: Dr. Salvatore Marvel   Encounter Date: 01/03/2016      PT End of Session - 01/03/16 1114    Visit Number 12   Number of Visits 18   Date for PT Re-Evaluation 01/21/16   Authorization Type Medicare/ BCBS ( G-codes updated on 01-27-23 visit)   Authorization Time Period 11/25/2015 to 01/27/16   Authorization - Visit Number 12   Authorization - Number of Visits 20   PT Start Time 1101   PT Stop Time 1150   PT Time Calculation (min) 49 min   Activity Tolerance Patient tolerated treatment well   Behavior During Therapy [facility] for tasks assessed/performed      Past Medical History  Diagnosis Date  . IBS (irritable bowel syndrome)     mixed  . Allergic rhinitis     uses Flonase daily as needed and takes CLaritin daily  . Eczema   . Plaque psoriasis   . Cerebral vascular malformation     sees dr Lovell Sheehan for monitoring as needed, sees dr lewitt for headaches every 4 months  . Lactose intolerance   . Anxiety     takes Xanax daily as needed  . Asthma     Albuterol inhaler prn;SYmbicort daily  . Depression     takes Citalopram daily  . Rheumatoid arthritis(714.0) 2010    oa and ra;Rhemicade IV every 6wks and Metotrexate weekly  . Nausea     takes Zofran daily as needed  . GERD (gastroesophageal reflux disease)     takes Protonix daily  . Hyperlipidemia     takes Pravastatin daily  . PONV (postoperative nausea and vomiting)   . Shortness of breath     with exertion  . Pneumonia 80yrs ago    hx of  . History of bronchitis as a child   . History of migraine     last one 10+yrs ago  . Stroke (HCC) 03/26/2013    left sided weakness  . Joint pain   . Joint swelling   . Fibromyalgia   . Diverticulitis at age 60  .  History of kidney stones   . Thyroid cyst   . Insomnia   . History of staph infection 8yrs ago  . Ventricular septal defect     2014 TEE lists large right to left shunt ASD/PFO (NO VSD mentioned)  . Sleep apnea     study done >62yrs ago;uses CPAP nightly  . PFO (patent foramen ovale)   . Seizures (HCC) 42months ago 03/21/14    takes Depakote daily  . Primary localized osteoarthritis of right knee 11/03/2015    Past Surgical History  Procedure Laterality Date  . Incontinence surgery  2010    sling done   . Knee arthroscopy  1992    left  . Foot surgery  1999    right ankle  . Kidney stone surgery  2001  . Appendectomy  1981  . Cholecystectomy  1981  . Tubal ligation  1990  . Total abdominal hysterectomy  2001  . Hernia repair  2006  . Left foot plating and scarping for arthritis  2011  . Right ear tube insertion  2011  . Incisional hernia repair  05/20/2012    Procedure: HERNIA REPAIR INCISIONAL;  Surgeon: Maisie Fus  Gwendolyn Handy, MD;  Location: [facility] ORS;  Service: General;  Laterality: N/A;  . Tee without cardioversion N/A 05/15/2013    Procedure: TRANSESOPHAGEAL ECHOCARDIOGRAM (TEE);  Surgeon: Pamella Pert, MD;  Location: [facility] ENDOSCOPY;  Service: Cardiovascular;  Laterality: N/A;  . Cardiac catheterization  2004  . Colonoscopy    . Thryoid biopsy    . Tonsillectomy and adenoidectomy  04/01/2014  . Tonsillectomy and adenoidectomy Bilateral 04/01/2014    Procedure: BILATERAL TONSILLECTOMY AND ADENOIDECTOMY;  Surgeon: Darletta Moll, MD;  Location: [facility] OR;  Service: ENT;  Laterality: Bilateral;  . Total knee arthroplasty Right 11/03/2015  . Total knee arthroplasty Right 11/03/2015    Procedure: RIGHT TOTAL KNEE ARTHROPLASTY/RIGHT;  Surgeon: Salvatore Marvel, MD;  Location: [facility] OR;  Service: Orthopedics;  Laterality: Right;    There were no vitals filed for this visit.  Visit Diagnosis:  Status post total right knee replacement  Stiffness of knee joint, right  Muscle weakness  Abnormality  of gait  Pain in joint of right knee      Subjective Assessment - 01/03/16 1102    Subjective Pt stated that she had "a really busy weekend" and was out all day yesterday. She further noted that her R knee is sore today that was rated a 2-3/10 on a VAS. Pt noted that she has noticed less R knee stiffness since last week and is able to better tolerate R TKE ther ex and positions.   Pertinent History CVA, DJD, RA, chest pain, and seizures    Limitations Standing;Walking   How long can you sit comfortably? 15 minutes    How long can you stand comfortably? fatigue with > 15 minutes    How long can you walk comfortably? fatigue with > 15 minutes    Diagnostic tests N/A   Patient Stated Goals Pt's main goal is to improve her strength, improve her general mobility, and return to leisure activities including hiking and swimming   Currently in Pain? Yes   Pain Score 3   R lateral knee pain has ranged between a 0-6/10 on a VAS   Pain Location Knee   Pain Orientation Right;Lateral   Pain Descriptors / Indicators Aching   Pain Type Surgical pain   Pain Radiating Towards None    Pain Onset More than a month ago   Pain Frequency Intermittent   Aggravating Factors  prolonged sitting, long distance ambulation, and R TKE    Pain Relieving Factors lying down, rest, and elevaton of the R LE    Effect of Pain on Daily Activities long distance community ambulation and prolonged sitting while riding in the car   Multiple Pain Sites No                         [facility] Adult PT Treatment/Exercise - 01/03/16 0001    Knee/Hip Exercises: Stretches   Passive Hamstring Stretch Right;3 reps;30 seconds   Passive Hamstring Stretch Limitations supine in 90/90 position   Quad Stretch Right;3 reps;30 seconds   Quad Stretch Limitations with strap in [place] position    Knee: Self-Stretch to increase Flexion Right;10 seconds   Knee: Self-Stretch Limitations 10 reps on 12" box   Gastroc Stretch Both;3  reps;30 seconds   Gastroc Stretch Limitations slantboard    Knee/Hip Exercises: Aerobic   Recumbent Bike L0-3 x 6 min   Knee/Hip Exercises: Standing   Heel Raises Both;20 reps   Lateral Step Up Right;1 set;Step Height: 6";15 reps  Forward Step Up Right;1 set;Step Height: 6";Hand Hold: 1;15 reps   Functional Squat 1 set;15 reps   Functional Squat Limitations with UE support    Knee/Hip Exercises: Seated   Long Arc Quad Strengthening;Right;2 sets;10 reps   Long Arc Quad Limitations red thera-band   Hamstring Curl Strengthening;Right;2 sets;10 reps   Hamstring Limitations red thera-band   Knee/Hip Exercises: Supine   Quad Sets Strengthening;Right;15 reps   Heel Slides AAROM;Right;1 set;15 reps   Heel Slides Limitations assist at end range   Knee Extension AAROM   Knee Extension Limitations 5 degrees   Knee Flexion AAROM   Knee Flexion Limitations 125 degrees (supine)   Knee/Hip Exercises: Prone   Prone Knee Hang 4 minutes   Manual Therapy   Manual Therapy Myofascial release   Manual therapy comments STM to hamstring and calf musculature with use of palmar spreading    Joint Mobilization R patellar mobs in S<>I direction using grade 2-3    Passive ROM R knee into extension and flexion x 10 reps with 5 sec hold                PT Education - 01/03/16 1113    Education provided Yes   Education Details Current HEP, self-scar massage, and gait sequence/pattern    Person(s) Educated Patient   Methods Explanation;Demonstration   Comprehension Verbalized understanding          PT Short Term Goals - 12/22/15 1139    PT SHORT TERM GOAL #1   Title Patient will be independent with TKA protocol HEP in order to further progress with ROM and strength at home.   Time 2   Period Weeks   Status Achieved   PT SHORT TERM GOAL #2   Title Patient will report reduced max R knee pain to 6/10 on a VAS in order to improve tolerance with ther ex and ambulation.    Baseline R knee pain  ranges between a 0-8/10 on a VAS    Time 3   Period Weeks   Status On-going   PT SHORT TERM GOAL #3   Title Patient will demo improved R knee AAROM to 0-105 degrees in order to improve R knee mobility and ROM with ambulation and transfers.    Baseline R knee AAROM=5-124 degrees    Time 3   Period Weeks   Status On-going   PT SHORT TERM GOAL #4   Title Patient will independently verbalize pain management strategies including use of cryotherapy in order to manage pain levels with ADLs and functional mobility activities.    Time 2   Period Weeks   Status Achieved           PT Long Term Goals - 12/22/15 1141    PT LONG TERM GOAL #1   Title Patient will report reduced R knee pain to 0-4/10 on a VAS in order to improve tolerance with ther ex and ambulation.    Baseline R knee pain ranges between a 0-8/10 on a VAS, but pain levels are mainly exacerbated with max R knee flexion and extension;    Time 7   Period Weeks   Status On-going   PT LONG TERM GOAL #2   Title Patient will demo improved R knee AAROM to 0-120 degrees in order to improve R knee mobility and ROM with stair climbing and leisure activities.    Time 7   Period Weeks   Status On-going   PT LONG TERM GOAL #3  Title Patient will demo improved R quad and HS strength to >4/5 MMT in order to improve performance with outdoor ambulation and transfers.    Baseline R HS strength=4-/5 MMT; R quad strength= 4/5 MMT    Time 7   Period Weeks   Status On-going   PT LONG TERM GOAL #4   Title TUG time will improve to <14 seconds without the use of a SPC in order to reduce the risk for falls.   Baseline TUG with SPC=14 seconds    Time 7   Period Weeks   Status Achieved   PT LONG TERM GOAL #5   Title Patient will independently ambulate on unlevel terrain for 15 minutes without the use of an AD with improved heel to toe sequence and improved R TKE at initial contact in order to progress towards a typical gait pattern.    Time 7    Period Weeks   Status On-going               Plan - 01/03/16 1115    Clinical Impression Statement Palpable tenderness assessed t/o the R distal HS and proximal gastoc muscle. Completed STM to R HS and gastoc with trigger points reduced. Improved patellar mobility palpated post patellar mobs. Added red thera-band resistance to LAQ and HS curls in short sit position. In addition, progressed step-ups to 6" step with additional reps. Good tolerance reported with progressed ther ex. Occasional verbal cues required for proper knee alignment and technique with squats and step-ups. R knee pain was rated a 2/10 on a VAS at the end of PT tx. R knee AAROM measured 5-125 degrees. Pt continues to progress towards stated goals with improved R knee ROM and less severe R knee pain. Continue with current POC with focus on R knee AROM/AAROM ther ex into TKE, quad/HS strengthening, and manual therapy techniques to reduce trigger points and improve patellar mobility.   Pt will benefit from skilled therapeutic intervention in order to improve on the following deficits Abnormal gait;Decreased range of motion;Difficulty walking;Pain;Decreased balance;Hypomobility;Impaired flexibility;Increased edema;Decreased strength;Decreased mobility   Rehab Potential Good   PT Frequency Other (comment)  3x/week x 3 weeks and 2x/week for 4 weeks    PT Duration Other (comment)  3x/week x 3 weeks and 2x/week for 4 weeks   PT Treatment/Interventions ADLs/Self Care Home Management;Cryotherapy;Electrical Stimulation;Moist Heat;Balance training;Therapeutic exercise;Therapeutic activities;Functional mobility training;Stair training;Gait training;DME Instruction;Neuromuscular re-education;Patient/family education;Manual techniques;Taping;Passive range of motion;Scar mobilization   PT Next Visit Plan Continue with ROM/strength progression with focus on R TKE, include hip abductor strengthening and knee extension.    PT Home Exercise  Plan Reviewed HEP with no changes made this visit    Recommended Other Services None at this time   Consulted and Agree with Plan of Care Patient        Problem List Patient Active Problem List   Diagnosis Date Noted  . Primary localized osteoarthritis of right knee 11/03/2015  . DJD (degenerative joint disease) of knee 11/03/2015  . Rheumatoid arthritis (HCC)   . PONV (postoperative nausea and vomiting)   . Seizures (HCC)   . Fibromyalgia   . Sleep apnea   . Ventricular septal defect   . History of staph infection   . Refractory migraine 08/10/2014  . Complex partial epilepsy (HCC) 08/10/2014  . S/P tonsillectomy and adenoidectomy 04/01/2014  . Panic anxiety syndrome 06/03/2013  . Positive depression screening 06/03/2013  . Headache(784.0) 06/03/2013  . Unspecified cerebral artery occlusion with cerebral infarction 06/03/2013  .  Headache 05/29/2013  . Patent foramen ovale 05/29/2013  . Chest pain 05/29/2013  . Other convulsions 05/29/2013  . Dysphagia, unspecified(787.20) 04/17/2013  . Acute bronchitis 03/27/2013  . Persistent headaches 03/26/2013  . CVA (cerebral infarction) history 03/26/2013  . Hypokalemia 03/26/2013  . Leukocytosis 03/26/2013  . Arthritis 03/26/2013  . QT prolongation 03/26/2013  . Stroke (HCC) 03/26/2013  . Recurrent ventral incisional hernia 04/19/2012  . Obstructive sleep apnea 08/22/2010  . RESTLESS LEGS SYNDROME 08/22/2010  . Moderate intermittent asthma without complication 08/22/2010  . ECZEMA 08/22/2010  . Irritable bowel syndrome 06/23/2010  . DIARRHEA 06/23/2010  . WEIGHT LOSS 11/01/2009  . NAUSEA WITH VOMITING 11/01/2009  . ABDOMINAL PAIN, LOWER 11/01/2009  . KNEE, ARTHRITIS, DEGEN./OSTEO 11/18/2008  . DERANGEMENT MENISCUS 11/02/2008  . JOINT EFFUSION, KNEE 11/02/2008  . KNEE PAIN 11/02/2008    Bonnee Quin, PT, DPT   01/03/2016, 11:58 AM  [place] [facility] [address] [place], [place],  [address] Phone: [phone]   Fax:  [phone]  Name: [name] MRN: [id number] Date of Birth: May 01, 1961

## 2016-01-05 ENCOUNTER — Ambulatory Visit (HOSPITAL_COMMUNITY): Payer: Medicare Other | Admitting: Physical Therapy

## 2016-01-05 DIAGNOSIS — M6281 Muscle weakness (generalized): Secondary | ICD-10-CM

## 2016-01-05 DIAGNOSIS — Z96651 Presence of right artificial knee joint: Secondary | ICD-10-CM

## 2016-01-05 DIAGNOSIS — M25561 Pain in right knee: Secondary | ICD-10-CM

## 2016-01-05 DIAGNOSIS — R269 Unspecified abnormalities of gait and mobility: Secondary | ICD-10-CM

## 2016-01-05 DIAGNOSIS — M25661 Stiffness of right knee, not elsewhere classified: Secondary | ICD-10-CM

## 2016-01-05 NOTE — Therapy (Signed)
[place] [facility] [address] [place], [place], [address] Phone: [phone]   Fax:  [phone]  Physical Therapy Treatment  Patient Details  Name: [name] MRN: [id number] Date of Birth: 02/26/1961 Referring Provider: Dr. Salvatore Marvel   Encounter Date: 01/05/2016      PT End of Session - 01/05/16 1519    Visit Number 13   Number of Visits 18   Date for PT Re-Evaluation 01/21/16   Authorization Type Medicare/ BCBS ( G-codes updated on 02-04-2023 visit)   Authorization Time Period 11/25/2015 to 02-04-16   Authorization - Visit Number 13   Authorization - Number of Visits 20   PT Start Time 1430   PT Stop Time 1515   PT Time Calculation (min) 45 min   Activity Tolerance Patient tolerated treatment well   Behavior During Therapy [facility] for tasks assessed/performed      Past Medical History  Diagnosis Date  . IBS (irritable bowel syndrome)     mixed  . Allergic rhinitis     uses Flonase daily as needed and takes CLaritin daily  . Eczema   . Plaque psoriasis   . Cerebral vascular malformation     sees dr Lovell Sheehan for monitoring as needed, sees dr lewitt for headaches every 4 months  . Lactose intolerance   . Anxiety     takes Xanax daily as needed  . Asthma     Albuterol inhaler prn;SYmbicort daily  . Depression     takes Citalopram daily  . Rheumatoid arthritis(714.0) 2010    oa and ra;Rhemicade IV every 6wks and Metotrexate weekly  . Nausea     takes Zofran daily as needed  . GERD (gastroesophageal reflux disease)     takes Protonix daily  . Hyperlipidemia     takes Pravastatin daily  . PONV (postoperative nausea and vomiting)   . Shortness of breath     with exertion  . Pneumonia 60yrs ago    hx of  . History of bronchitis as a child   . History of migraine     last one 10+yrs ago  . Stroke (HCC) 03/26/2013    left sided weakness  . Joint pain   . Joint swelling   . Fibromyalgia   . Diverticulitis at age 54  .  History of kidney stones   . Thyroid cyst   . Insomnia   . History of staph infection 43yrs ago  . Ventricular septal defect     2014 TEE lists large right to left shunt ASD/PFO (NO VSD mentioned)  . Sleep apnea     study done >14yrs ago;uses CPAP nightly  . PFO (patent foramen ovale)   . Seizures (HCC) 6months ago 03/21/14    takes Depakote daily  . Primary localized osteoarthritis of right knee 11/03/2015    Past Surgical History  Procedure Laterality Date  . Incontinence surgery  2010    sling done   . Knee arthroscopy  1992    left  . Foot surgery  1999    right ankle  . Kidney stone surgery  2001  . Appendectomy  1981  . Cholecystectomy  1981  . Tubal ligation  1990  . Total abdominal hysterectomy  2001  . Hernia repair  2006  . Left foot plating and scarping for arthritis  2011  . Right ear tube insertion  2011  . Incisional hernia repair  05/20/2012    Procedure: HERNIA REPAIR INCISIONAL;  Surgeon: Maisie Fus  Nonie HoOrlie [id number] MitchellAbbey ChattersK[B14. hamAbbey ChattersKirke CorinShaune Pollack ankerarolan Shiver [phone] Del58Kristopher Oppenheim  [[id number] [id number] DakinNonie HoyerAram BeechamAbbey ChattersKirke CorinShaune Pollack BankerCarolan Shiver [phone] Del85Kristopher Oppenheim  [[id number] [id number] DakinNonie HoyerAram BeechamAbbey ChattersKirke CorinShaune Pollack Banker  and ambulation.    Baseline R knee pain ranges between a 0-8/10 on a VAS    Time 3   Period Weeks   Status On-going   PT SHORT TERM GOAL #3   Title Patient will demo improved R knee AAROM to 0-105 degrees in order to improve R knee mobility and ROM with ambulation and transfers.    Baseline R knee AAROM=5-124 degrees    Time 3   Period Weeks   Status On-going   PT SHORT TERM GOAL #4   Title Patient will independently verbalize pain management strategies including use of cryotherapy in order to manage pain levels with ADLs and functional mobility activities.    Time 2   Period Weeks   Status Achieved           PT Long Term Goals - 12/22/15 1141    PT LONG TERM GOAL #1   Title Patient will report reduced R knee pain to 0-4/10 on a VAS in order to improve tolerance with ther ex and ambulation.    Baseline R knee pain ranges between a 0-8/10 on a VAS, but pain levels are mainly exacerbated with max R knee flexion and extension;    Time 7   Period Weeks   Status On-going   PT LONG TERM GOAL #2   Title Patient will demo improved R knee AAROM to 0-120 degrees in order to improve R knee mobility and ROM with stair climbing and leisure activities.    Time 7   Period Weeks   Status On-going   PT LONG TERM GOAL #3   Title Patient will demo improved R quad and HS strength to >4/5 MMT in order to improve performance with outdoor ambulation and transfers.    Baseline R HS strength=4-/5 MMT; R quad strength= 4/5 MMT    Time 7   Period Weeks   Status On-going   PT LONG TERM GOAL #4   Title TUG time will improve to <14 seconds without the use of a SPC in order to reduce the risk for falls.   Baseline TUG with SPC=14 seconds    Time 7   Period Weeks   Status Achieved    PT LONG TERM GOAL #5   Title Patient will independently ambulate on unlevel terrain for 15 minutes without the use of an AD with improved heel to toe sequence and improved R TKE at initial contact in order to progress towards a typical gait pattern.    Time 7   Period Weeks   Status On-going               Plan - 01/05/16 1520    Clinical Impression Statement Pt continues to present with trigger points throughout her proximal gastroc and distal hamstring/quad. Therapist performed soft tissue massage which resulted in improved pain report and decreased trigger points. She continues to demonstrate limited quad strength with activity, however this is improving with the addition of red TB for her LAQ ther ex without too much difficulty. Pt's knee pain subsided by the end of today's session and her HEP was updated without any questions/concerns. Will continue with current POC targeting knee ROM, quad/hamstring strengthening and manual therapy techniques to improve pain and relaxation.   Pt will benefit from skilled therapeutic intervention in order to improve on the following deficits Abnormal gait;Decreased range of motion;Difficulty walking;Pain;Decreased balance;Hypomobility;Impaired flexibility;Increased edema;Decreased strength;Decreased mobility   Rehab Potential Good   PT Frequency Other (comment)  3x/week x 3 weeks  and 2x/week for 4 weeks    PT Duration Other (comment)  3x/week x 3 weeks and 2x/week for 4 weeks   PT Treatment/Interventions ADLs/Self Care Home Management;Cryotherapy;Electrical Stimulation;Moist Heat;Balance training;Therapeutic exercise;Therapeutic activities;Functional mobility training;Stair training;Gait training;DME Instruction;Neuromuscular re-education;Patient/family education;Manual techniques;Taping;Passive range of motion;Scar mobilization   PT Next Visit Plan Continue with ROM/strength progression with focus on R TKE, include hip abductor strengthening and knee  extension.    PT Home Exercise Plan Reviewed HEP and updated with red TB for LAQ and hamstring curls.    Consulted and Agree with Plan of Care Patient        Problem List Patient Active Problem List   Diagnosis Date Noted  . Primary localized osteoarthritis of right knee 11/03/2015  . DJD (degenerative joint disease) of knee 11/03/2015  . Rheumatoid arthritis (HCC)   . PONV (postoperative nausea and vomiting)   . Seizures (HCC)   . Fibromyalgia   . Sleep apnea   . Ventricular septal defect   . History of staph infection   . Refractory migraine 08/10/2014  . Complex partial epilepsy (HCC) 08/10/2014  . S/P tonsillectomy and adenoidectomy 04/01/2014  . Panic anxiety syndrome 06/03/2013  . Positive depression screening 06/03/2013  . Headache(784.0) 06/03/2013  . Unspecified cerebral artery occlusion with cerebral infarction 06/03/2013  . Headache 05/29/2013  . Patent foramen ovale 05/29/2013  . Chest pain 05/29/2013  . Other convulsions 05/29/2013  . Dysphagia, unspecified(787.20) 04/17/2013  . Acute bronchitis 03/27/2013  . Persistent headaches 03/26/2013  . CVA (cerebral infarction) history 03/26/2013  . Hypokalemia 03/26/2013  . Leukocytosis 03/26/2013  . Arthritis 03/26/2013  . QT prolongation 03/26/2013  . Stroke (HCC) 03/26/2013  . Recurrent ventral incisional hernia 04/19/2012  . Obstructive sleep apnea 08/22/2010  . RESTLESS LEGS SYNDROME 08/22/2010  . Moderate intermittent asthma without complication 08/22/2010  . ECZEMA 08/22/2010  . Irritable bowel syndrome 06/23/2010  . DIARRHEA 06/23/2010  . WEIGHT LOSS 11/01/2009  . NAUSEA WITH VOMITING 11/01/2009  . ABDOMINAL PAIN, LOWER 11/01/2009  . KNEE, ARTHRITIS, DEGEN./OSTEO 11/18/2008  . DERANGEMENT MENISCUS 11/02/2008  . JOINT EFFUSION, KNEE 11/02/2008  . KNEE PAIN 11/02/2008    3:31 PM,01/05/2016 Marylyn Ishihara PT, DPT [facility] [phone]   [facility] [address] [place], [place], [address] Phone: [phone]   Fax:  [phone]  Name: JANEANN PAISLEY MRN: [id number] Date of Birth: 1961-02-28

## 2016-01-06 ENCOUNTER — Encounter (HOSPITAL_COMMUNITY): Payer: BC Managed Care – PPO

## 2016-01-10 ENCOUNTER — Encounter (HOSPITAL_COMMUNITY): Payer: Self-pay

## 2016-01-10 ENCOUNTER — Ambulatory Visit (HOSPITAL_COMMUNITY): Payer: Medicare Other

## 2016-01-10 DIAGNOSIS — Z96651 Presence of right artificial knee joint: Secondary | ICD-10-CM | POA: Diagnosis not present

## 2016-01-10 DIAGNOSIS — M25561 Pain in right knee: Secondary | ICD-10-CM

## 2016-01-10 DIAGNOSIS — R269 Unspecified abnormalities of gait and mobility: Secondary | ICD-10-CM

## 2016-01-10 DIAGNOSIS — M25661 Stiffness of right knee, not elsewhere classified: Secondary | ICD-10-CM

## 2016-01-10 DIAGNOSIS — M6281 Muscle weakness (generalized): Secondary | ICD-10-CM

## 2016-01-10 NOTE — Therapy (Signed)
[facility] [address] [place], [place], [address] Phone: [phone]   Fax:  [phone]  Physical Therapy Treatment  Patient Details  Name: [name] MRN: [id number] Date of Birth: 26-Mar-1961 Referring Provider: Dr. Salvatore Marvel   Encounter Date: 01/10/2016      PT End of Session - 01/10/16 1111    Visit Number 14   Number of Visits 18   Date for PT Re-Evaluation 01/21/16   Authorization Type Medicare/ BCBS ( G-codes updated on 02/10/2023 visit)   Authorization Time Period 11/25/2015 to 02-10-2016   Authorization - Visit Number 14   Authorization - Number of Visits 20   PT Start Time 1100   PT Stop Time 1155   PT Time Calculation (min) 55 min   Activity Tolerance Patient tolerated treatment well   Behavior During Therapy [facility] for tasks assessed/performed      Past Medical History  Diagnosis Date  . IBS (irritable bowel syndrome)     mixed  . Allergic rhinitis     uses Flonase daily as needed and takes CLaritin daily  . Eczema   . Plaque psoriasis   . Cerebral vascular malformation     sees dr Lovell Sheehan for monitoring as needed, sees dr lewitt for headaches every 4 months  . Lactose intolerance   . Anxiety     takes Xanax daily as needed  . Asthma     Albuterol inhaler prn;SYmbicort daily  . Depression     takes Citalopram daily  . Rheumatoid arthritis(714.0) 2010    oa and ra;Rhemicade IV every 6wks and Metotrexate weekly  . Nausea     takes Zofran daily as needed  . GERD (gastroesophageal reflux disease)     takes Protonix daily  . Hyperlipidemia     takes Pravastatin daily  . PONV (postoperative nausea and vomiting)   . Shortness of breath     with exertion  . Pneumonia 72yrs ago    hx of  . History of bronchitis as a child   . History of migraine     last one 10+yrs ago  . Stroke (HCC) 03/26/2013    left sided weakness  . Joint pain   . Joint swelling   . Fibromyalgia   . Diverticulitis at age 42  .  History of kidney stones   . Thyroid cyst   . Insomnia   . History of staph infection 60yrs ago  . Ventricular septal defect     2014 TEE lists large right to left shunt ASD/PFO (NO VSD mentioned)  . Sleep apnea     study done >42yrs ago;uses CPAP nightly  . PFO (patent foramen ovale)   . Seizures (HCC) 6months ago 03/21/14    takes Depakote daily  . Primary localized osteoarthritis of right knee 11/03/2015    Past Surgical History  Procedure Laterality Date  . Incontinence surgery  2010    sling done   . Knee arthroscopy  1992    left  . Foot surgery  1999    right ankle  . Kidney stone surgery  2001  . Appendectomy  1981  . Cholecystectomy  1981  . Tubal ligation  1990  . Total abdominal hysterectomy  2001  . Hernia repair  2006  . Left foot plating and scarping for arthritis  2011  . Right ear tube insertion  2011  . Incisional hernia repair  05/20/2012    Procedure: HERNIA REPAIR INCISIONAL;  Surgeon: Maisie Fus  Liberty Handy, MD;  Location: [facility] ORS;  Service: General;  Laterality: N/A;  . Tee without cardioversion N/A 05/15/2013    Procedure: TRANSESOPHAGEAL ECHOCARDIOGRAM (TEE);  Surgeon: Pamella Pert, MD;  Location: [facility] ENDOSCOPY;  Service: Cardiovascular;  Laterality: N/A;  . Cardiac catheterization  2004  . Colonoscopy    . Thryoid biopsy    . Tonsillectomy and adenoidectomy  04/01/2014  . Tonsillectomy and adenoidectomy Bilateral 04/01/2014    Procedure: BILATERAL TONSILLECTOMY AND ADENOIDECTOMY;  Surgeon: Darletta Moll, MD;  Location: [facility] OR;  Service: ENT;  Laterality: Bilateral;  . Total knee arthroplasty Right 11/03/2015  . Total knee arthroplasty Right 11/03/2015    Procedure: RIGHT TOTAL KNEE ARTHROPLASTY/RIGHT;  Surgeon: Salvatore Marvel, MD;  Location: [facility] OR;  Service: Orthopedics;  Laterality: Right;    There were no vitals filed for this visit.  Visit Diagnosis:  Status post total right knee replacement  Stiffness of knee joint, right  Muscle weakness  Abnormality  of gait  Pain in joint of right knee      Subjective Assessment - 01/10/16 1105    Subjective Pt rated her R knee pain a 2/10 on a VAS upon arrival to clinic. Pt stated "I'm doing great today". Pt continues to c/o difficulty with prolonged standing and walking for >15 minutes. Pt reports that her R knee pain has ranged between a 0-4/10 on a VAS since last week.   Pertinent History CVA, DJD, RA, chest pain, and seizures    How long can you stand comfortably? fatigue with > 15 minutes    How long can you walk comfortably? fatigue with > 15 minutes    Diagnostic tests N/A   Patient Stated Goals Pt's main goal is to improve her strength, improve her general mobility, and return to leisure activities including hiking and swimming   Currently in Pain? Yes   Pain Score 2    Pain Location Knee   Pain Orientation Right;Lateral   Pain Descriptors / Indicators Aching;Sore   Pain Type Surgical pain   Pain Radiating Towards None    Pain Onset More than a month ago   Pain Frequency Intermittent   Aggravating Factors  max R knee flexion, prolonged standing, and long distance ambulation   Pain Relieving Factors lying down, rest, prescribed pain meds, and elevation of the R LE    Effect of Pain on Daily Activities long distance community ambulation    Multiple Pain Sites No                         [facility] Adult PT Treatment/Exercise - 01/10/16 0001    Knee/Hip Exercises: Stretches   Passive Hamstring Stretch Right;3 reps;30 seconds   Passive Hamstring Stretch Limitations supine    Quad Stretch Right;3 reps;30 seconds   Quad Stretch Limitations with strap in [employer] position    Theme park manager Both;3 reps;30 seconds   Gastroc Stretch Limitations standing   Knee/Hip Exercises: Aerobic   Recumbent Bike L3 x  Seat 10-11   Knee/Hip Exercises: Standing   Lateral Step Up Right;2 sets;10 reps;Hand Hold: 1;Step Height: 6"   Forward Step Up Right;Step Height: 6";Hand Hold: 1;2  sets;10 reps   Functional Squat 2 sets;15 reps   Functional Squat Limitations with UE support    Knee/Hip Exercises: Supine   Heel Slides AAROM;Right;1 set;15 reps   Heel Slides Limitations assist at end range   Knee Extension AAROM   Knee Extension Limitations 4 degrees  Knee Flexion AAROM   Knee Flexion Limitations 125 degrees (supine)   Manual Therapy   Manual Therapy Soft tissue mobilization;Joint mobilization   Manual therapy comments Performed separately from all other interventions   Joint Mobilization R patellar mobs in S<>I and M<>L using grades I-IV; grade I-III PA femoral jt mobs with pt in supine   Soft tissue mobilization trigger point release and palmar spreading technique to distal quad/hamstring, proximal gastroc   Passive ROM R knee into extension and flexion x 10 reps with 5 sec hold                PT Education - 01/10/16 1110    Education provided Yes   Education Details HEP   Person(s) Educated Patient   Methods Explanation;Demonstration   Comprehension Verbalized understanding          PT Short Term Goals - 12/22/15 1139    PT SHORT TERM GOAL #1   Title Patient will be independent with TKA protocol HEP in order to further progress with ROM and strength at home.   Time 2   Period Weeks   Status Achieved   PT SHORT TERM GOAL #2   Title Patient will report reduced max R knee pain to 6/10 on a VAS in order to improve tolerance with ther ex and ambulation.    Baseline R knee pain ranges between a 0-8/10 on a VAS    Time 3   Period Weeks   Status On-going   PT SHORT TERM GOAL #3   Title Patient will demo improved R knee AAROM to 0-105 degrees in order to improve R knee mobility and ROM with ambulation and transfers.    Baseline R knee AAROM=5-124 degrees    Time 3   Period Weeks   Status On-going   PT SHORT TERM GOAL #4   Title Patient will independently verbalize pain management strategies including use of cryotherapy in order to manage pain  levels with ADLs and functional mobility activities.    Time 2   Period Weeks   Status Achieved           PT Long Term Goals - 12/22/15 1141    PT LONG TERM GOAL #1   Title Patient will report reduced R knee pain to 0-4/10 on a VAS in order to improve tolerance with ther ex and ambulation.    Baseline R knee pain ranges between a 0-8/10 on a VAS, but pain levels are mainly exacerbated with max R knee flexion and extension;    Time 7   Period Weeks   Status On-going   PT LONG TERM GOAL #2   Title Patient will demo improved R knee AAROM to 0-120 degrees in order to improve R knee mobility and ROM with stair climbing and leisure activities.    Time 7   Period Weeks   Status On-going   PT LONG TERM GOAL #3   Title Patient will demo improved R quad and HS strength to >4/5 MMT in order to improve performance with outdoor ambulation and transfers.    Baseline R HS strength=4-/5 MMT; R quad strength= 4/5 MMT    Time 7   Period Weeks   Status On-going   PT LONG TERM GOAL #4   Title TUG time will improve to <14 seconds without the use of a SPC in order to reduce the risk for falls.   Baseline TUG with SPC=14 seconds    Time 7   Period Weeks  Status Achieved   PT LONG TERM GOAL #5   Title Patient will independently ambulate on unlevel terrain for 15 minutes without the use of an AD with improved heel to toe sequence and improved R TKE at initial contact in order to progress towards a typical gait pattern.    Time 7   Period Weeks   Status On-going               Plan - 01/10/16 1111    Clinical Impression Statement Initiated PT tx with warm-up on recumbent bike and indoor ambulation. Pt continues to present with limited R TKE at initial contact and wide BOS. Instructed pt on proper heel to toe sequence and to allow the R knee to flex during swing phase and R TKE at initial contact. Improved gait pattern assessed once cues provided. Added an additional set with front  step-ups, lateral step-ups, and partial squats. Pt tolerated additional sets with ther ex, but required a minimal rest break between sets secondary to R LE fatigue. Verbal cues required to avoid compensatory movements with improved technique assessed. Pt is making steady progress towards stated PT goals with less severe R knee pain and improved R knee ROM. R knee AAROM was measured 4-125 degrees. Completed R tibiofemoral and patellar mobs with improved mobility assessed and reported. Minor tenderness to palpation assessed of the proximal quad, gastroc, and hamstrings muscles that was reduce post STM. Pt would continue to benefit from skilled PT to address current gait deficits, R knee TKE AROM deficits, and remaining strength deficits. Continue with current POC.    Pt will benefit from skilled therapeutic intervention in order to improve on the following deficits Abnormal gait;Decreased range of motion;Difficulty walking;Pain;Decreased balance;Hypomobility;Impaired flexibility;Increased edema;Decreased strength;Decreased mobility   Rehab Potential Good   PT Frequency Other (comment)  3x/week for 3 weeks and 2x/week for 4 weeks    PT Duration Other (comment)  3x/week for 3 weeks and 2x/week for 4 weeks   PT Treatment/Interventions ADLs/Self Care Home Management;Cryotherapy;Electrical Stimulation;Moist Heat;Balance training;Therapeutic exercise;Therapeutic activities;Functional mobility training;Stair training;Gait training;DME Instruction;Neuromuscular re-education;Patient/family education;Manual techniques;Taping;Passive range of motion;Scar mobilization   PT Next Visit Plan Continue with ROM/strength progression with focus on R TKE, include hip abductor strengthening and knee extension. Continue with manual therapy techniques to imporve R TKE.    PT Home Exercise Plan Reviewed HEP with good understanding demo with resisted LAQ and hamstring curls.    Consulted and Agree with Plan of Care Patient         Problem List Patient Active Problem List   Diagnosis Date Noted  . Primary localized osteoarthritis of right knee 11/03/2015  . DJD (degenerative joint disease) of knee 11/03/2015  . Rheumatoid arthritis (HCC)   . PONV (postoperative nausea and vomiting)   . Seizures (HCC)   . Fibromyalgia   . Sleep apnea   . Ventricular septal defect   . History of staph infection   . Refractory migraine 08/10/2014  . Complex partial epilepsy (HCC) 08/10/2014  . S/P tonsillectomy and adenoidectomy 04/01/2014  . Panic anxiety syndrome 06/03/2013  . Positive depression screening 06/03/2013  . Headache(784.0) 06/03/2013  . Unspecified cerebral artery occlusion with cerebral infarction 06/03/2013  . Headache 05/29/2013  . Patent foramen ovale 05/29/2013  . Chest pain 05/29/2013  . Other convulsions 05/29/2013  . Dysphagia, unspecified(787.20) 04/17/2013  . Acute bronchitis 03/27/2013  . Persistent headaches 03/26/2013  . CVA (cerebral infarction) history 03/26/2013  . Hypokalemia 03/26/2013  . Leukocytosis 03/26/2013  . Arthritis  03/26/2013  . QT prolongation 03/26/2013  . Stroke (HCC) 03/26/2013  . Recurrent ventral incisional hernia 04/19/2012  . Obstructive sleep apnea 08/22/2010  . RESTLESS LEGS SYNDROME 08/22/2010  . Moderate intermittent asthma without complication 08/22/2010  . ECZEMA 08/22/2010  . Irritable bowel syndrome 06/23/2010  . DIARRHEA 06/23/2010  . WEIGHT LOSS 11/01/2009  . NAUSEA WITH VOMITING 11/01/2009  . ABDOMINAL PAIN, LOWER 11/01/2009  . KNEE, ARTHRITIS, DEGEN./OSTEO 11/18/2008  . DERANGEMENT MENISCUS 11/02/2008  . JOINT EFFUSION, KNEE 11/02/2008  . KNEE PAIN 11/02/2008    Bonnee Quin, PT, DPT  01/10/2016, 12:07 PM  [facility] [address] [place], [place], [address] Phone: [phone]   Fax:  [phone]  Name: [name] MRN: [id number] Date of Birth: August 25, 1961

## 2016-01-12 ENCOUNTER — Ambulatory Visit (HOSPITAL_COMMUNITY): Payer: Medicare Other | Admitting: Physical Therapy

## 2016-01-12 DIAGNOSIS — M25661 Stiffness of right knee, not elsewhere classified: Secondary | ICD-10-CM

## 2016-01-12 DIAGNOSIS — M25561 Pain in right knee: Secondary | ICD-10-CM

## 2016-01-12 DIAGNOSIS — M6281 Muscle weakness (generalized): Secondary | ICD-10-CM

## 2016-01-12 DIAGNOSIS — Z96651 Presence of right artificial knee joint: Secondary | ICD-10-CM

## 2016-01-12 DIAGNOSIS — R269 Unspecified abnormalities of gait and mobility: Secondary | ICD-10-CM

## 2016-01-12 NOTE — Therapy (Signed)
[facility] [address] [place], [place], [address] Phone: [phone]   Fax:  [phone]  Physical Therapy Treatment  Patient Details  Name: [name] MRN: [id number] Date of Birth: February 05, 1961 Referring Provider: Dr. Salvatore Marvel   Encounter Date: 01/12/2016      PT End of Session - 01/12/16 1024    Visit Number 15   Number of Visits 18   Date for PT Re-Evaluation 01/21/16   Authorization Type Medicare/ BCBS ( G-codes updated on 2023-01-30 visit)   Authorization Time Period 11/25/2015 to 2016-01-30   Authorization - Visit Number 15   Authorization - Number of Visits 20   PT Start Time 0932   PT Stop Time 1016   PT Time Calculation (min) 44 min   Activity Tolerance Patient tolerated treatment well   Behavior During Therapy [facility] for tasks assessed/performed      Past Medical History  Diagnosis Date  . IBS (irritable bowel syndrome)     mixed  . Allergic rhinitis     uses Flonase daily as needed and takes CLaritin daily  . Eczema   . Plaque psoriasis   . Cerebral vascular malformation     sees dr Lovell Sheehan for monitoring as needed, sees dr lewitt for headaches every 4 months  . Lactose intolerance   . Anxiety     takes Xanax daily as needed  . Asthma     Albuterol inhaler prn;SYmbicort daily  . Depression     takes Citalopram daily  . Rheumatoid arthritis(714.0) 2010    oa and ra;Rhemicade IV every 6wks and Metotrexate weekly  . Nausea     takes Zofran daily as needed  . GERD (gastroesophageal reflux disease)     takes Protonix daily  . Hyperlipidemia     takes Pravastatin daily  . PONV (postoperative nausea and vomiting)   . Shortness of breath     with exertion  . Pneumonia 57yrs ago    hx of  . History of bronchitis as a child   . History of migraine     last one 10+yrs ago  . Stroke (HCC) 03/26/2013    left sided weakness  . Joint pain   . Joint swelling   . Fibromyalgia   . Diverticulitis at age 55  .  History of kidney stones   . Thyroid cyst   . Insomnia   . History of staph infection 57yrs ago  . Ventricular septal defect     2014 TEE lists large right to left shunt ASD/PFO (NO VSD mentioned)  . Sleep apnea     study done >57yrs ago;uses CPAP nightly  . PFO (patent foramen ovale)   . Seizures (HCC) 57months ago 03/21/14    takes Depakote daily  . Primary localized osteoarthritis of right knee 11/03/2015    Past Surgical History  Procedure Laterality Date  . Incontinence surgery  2010    sling done   . Knee arthroscopy  1992    left  . Foot surgery  1999    right ankle  . Kidney stone surgery  2001  . Appendectomy  1981  . Cholecystectomy  1981  . Tubal ligation  1990  . Total abdominal hysterectomy  2001  . Hernia repair  2006  . Left foot plating and scarping for arthritis  2011  . Right ear tube insertion  2011  . Incisional hernia repair  05/20/2012    Procedure: HERNIA REPAIR INCISIONAL;  Surgeon: Maisie Fus  Liberty Handy, MD;  Location: [facility] ORS;  Service: General;  Laterality: N/A;  . Tee without cardioversion N/A 05/15/2013    Procedure: TRANSESOPHAGEAL ECHOCARDIOGRAM (TEE);  Surgeon: Pamella Pert, MD;  Location: [facility] ENDOSCOPY;  Service: Cardiovascular;  Laterality: N/A;  . Cardiac catheterization  2004  . Colonoscopy    . Thryoid biopsy    . Tonsillectomy and adenoidectomy  04/01/2014  . Tonsillectomy and adenoidectomy Bilateral 04/01/2014    Procedure: BILATERAL TONSILLECTOMY AND ADENOIDECTOMY;  Surgeon: Darletta Moll, MD;  Location: [facility] OR;  Service: ENT;  Laterality: Bilateral;  . Total knee arthroplasty Right 11/03/2015  . Total knee arthroplasty Right 11/03/2015    Procedure: RIGHT TOTAL KNEE ARTHROPLASTY/RIGHT;  Surgeon: Salvatore Marvel, MD;  Location: [facility] OR;  Service: Orthopedics;  Laterality: Right;    There were no vitals filed for this visit.  Visit Diagnosis:  Status post total right knee replacement  Stiffness of knee joint, right  Abnormality of gait  Pain in  joint of right knee  Muscle weakness      Subjective Assessment - 01/12/16 0939    Subjective Pt notes she is not experiencing any pain today, but her Rt glute region is a little sore. She states she is performing her exercises at home without difficutly   Pertinent History CVA, DJD, RA, chest pain, and seizures    Limitations Standing;Walking   Currently in Pain? No/denies  just mild discomfort noted in glute region with walking                          [facility] Adult PT Treatment/Exercise - 01/12/16 0001    Knee/Hip Exercises: Stretches   Passive Hamstring Stretch Right;3 reps;30 seconds   Passive Hamstring Stretch Limitations seated   Knee/Hip Exercises: Aerobic   Nustep L1 x72min   Knee/Hip Exercises: Standing   Other Standing Knee Exercises retrostepping 2x20 reps with UE support   Knee/Hip Exercises: Supine   Heel Slides AAROM;Right;1 set;15 reps   Heel Slides Limitations assist at end range   Bridges with Beacher May Strengthening;Both;1 set;15 reps   Knee Extension AAROM   Knee Extension Limitations 5 degrees    Knee Flexion AAROM   Knee Flexion Limitations 122 degrees    Manual Therapy   Manual Therapy Joint mobilization   Manual therapy comments Performed separately from all other interventions   Joint Mobilization R patellar mobs in S<>I and M<>L using grades I-IV; grade I-III PA femoral jt mobs with pt in supine; Rt fibular mobs grade I-IV   Soft tissue mobilization trigger point release Rt piriformis                PT Education - 01/12/16 1022    Education provided Yes   Education Details Updated HEP with addition of bridge; discussed importance of equal wt shift during sit to stand and squat activities, encouraging pt to refrain from picking up her 57 granddaughter if unable to maintain proper technique to prevent fall   Person(s) Educated Patient   Methods Explanation;Demonstration   Comprehension Verbalized understanding;Returned  demonstration          PT Short Term Goals - 12/22/15 1139    PT SHORT TERM GOAL #1   Title Patient will be independent with TKA protocol HEP in order to further progress with ROM and strength at home.   Time 2   Period Weeks   Status Achieved   PT SHORT TERM GOAL #2   Title Patient  will report reduced max R knee pain to 6/10 on a VAS in order to improve tolerance with ther ex and ambulation.    Baseline R knee pain ranges between a 0-8/10 on a VAS    Time 3   Period Weeks   Status On-going   PT SHORT TERM GOAL #3   Title Patient will demo improved R knee AAROM to 0-105 degrees in order to improve R knee mobility and ROM with ambulation and transfers.    Baseline R knee AAROM=5-124 degrees    Time 3   Period Weeks   Status On-going   PT SHORT TERM GOAL #4   Title Patient will independently verbalize pain management strategies including use of cryotherapy in order to manage pain levels with ADLs and functional mobility activities.    Time 2   Period Weeks   Status Achieved           PT Long Term Goals - 12/22/15 1141    PT LONG TERM GOAL #1   Title Patient will report reduced R knee pain to 0-4/10 on a VAS in order to improve tolerance with ther ex and ambulation.    Baseline R knee pain ranges between a 0-8/10 on a VAS, but pain levels are mainly exacerbated with max R knee flexion and extension;    Time 7   Period Weeks   Status On-going   PT LONG TERM GOAL #2   Title Patient will demo improved R knee AAROM to 0-120 degrees in order to improve R knee mobility and ROM with stair climbing and leisure activities.    Time 7   Period Weeks   Status On-going   PT LONG TERM GOAL #3   Title Patient will demo improved R quad and HS strength to >4/5 MMT in order to improve performance with outdoor ambulation and transfers.    Baseline R HS strength=4-/5 MMT; R quad strength= 4/5 MMT    Time 7   Period Weeks   Status On-going   PT LONG TERM GOAL #4   Title TUG time  will improve to <14 seconds without the use of a SPC in order to reduce the risk for falls.   Baseline TUG with SPC=14 seconds    Time 7   Period Weeks   Status Achieved   PT LONG TERM GOAL #5   Title Patient will independently ambulate on unlevel terrain for 15 minutes without the use of an AD with improved heel to toe sequence and improved R TKE at initial contact in order to progress towards a typical gait pattern.    Time 7   Period Weeks   Status On-going               Plan - 01/12/16 1024    Clinical Impression Statement Pt continues to present with limited R TKE in both open and closed chain positions. Session focusing on manual techniques to improve knee ROM and pain relief. She responded well to manual treatment with decreased pain in her R glute region. Also completed tibiofemoral/patellar/fibular mobs with improved mobility reported and assessed. Will continue current POC to address gait deficits, R knee ROM and strength.   Pt will benefit from skilled therapeutic intervention in order to improve on the following deficits Abnormal gait;Decreased range of motion;Difficulty walking;Pain;Decreased balance;Hypomobility;Impaired flexibility;Increased edema;Decreased strength;Decreased mobility   Rehab Potential Good   PT Frequency Other (comment)  3x/week for 3 weeks and 2x/week for 4 weeks    PT Duration  Other (comment)  3x/week for 3 weeks and 2x/week for 4 weeks   PT Treatment/Interventions ADLs/Self Care Home Management;Cryotherapy;Electrical Stimulation;Moist Heat;Balance training;Therapeutic exercise;Therapeutic activities;Functional mobility training;Stair training;Gait training;DME Instruction;Neuromuscular re-education;Patient/family education;Manual techniques;Taping;Passive range of motion;Scar mobilization   PT Next Visit Plan Continue with ROM/strength progression with focus on R TKE, include functional hip/knee strengthening. Continue with manual therapy techniques to  imporve R TKE.    PT Home Exercise Plan updated with bridge   Consulted and Agree with Plan of Care Patient        Problem List Patient Active Problem List   Diagnosis Date Noted  . Primary localized osteoarthritis of right knee 11/03/2015  . DJD (degenerative joint disease) of knee 11/03/2015  . Rheumatoid arthritis (HCC)   . PONV (postoperative nausea and vomiting)   . Seizures (HCC)   . Fibromyalgia   . Sleep apnea   . Ventricular septal defect   . History of staph infection   . Refractory migraine 08/10/2014  . Complex partial epilepsy (HCC) 08/10/2014  . S/P tonsillectomy and adenoidectomy 04/01/2014  . Panic anxiety syndrome 06/03/2013  . Positive depression screening 06/03/2013  . Headache(784.0) 06/03/2013  . Unspecified cerebral artery occlusion with cerebral infarction 06/03/2013  . Headache 05/29/2013  . Patent foramen ovale 05/29/2013  . Chest pain 05/29/2013  . Other convulsions 05/29/2013  . Dysphagia, unspecified(787.20) 04/17/2013  . Acute bronchitis 03/27/2013  . Persistent headaches 03/26/2013  . CVA (cerebral infarction) history 03/26/2013  . Hypokalemia 03/26/2013  . Leukocytosis 03/26/2013  . Arthritis 03/26/2013  . QT prolongation 03/26/2013  . Stroke (HCC) 03/26/2013  . Recurrent ventral incisional hernia 04/19/2012  . Obstructive sleep apnea 08/22/2010  . RESTLESS LEGS SYNDROME 08/22/2010  . Moderate intermittent asthma without complication 08/22/2010  . ECZEMA 08/22/2010  . Irritable bowel syndrome 06/23/2010  . DIARRHEA 06/23/2010  . WEIGHT LOSS 11/01/2009  . NAUSEA WITH VOMITING 11/01/2009  . ABDOMINAL PAIN, LOWER 11/01/2009  . KNEE, ARTHRITIS, DEGEN./OSTEO 11/18/2008  . DERANGEMENT MENISCUS 11/02/2008  . JOINT EFFUSION, KNEE 11/02/2008  . KNEE PAIN 11/02/2008    10:35 AM,01/12/2016 Marylyn Ishihara PT, DPT [facility] [phone]  [facility] [address]  [place], [place], [address] Phone: [phone]   Fax:  [phone]  Name: YOALI CONRY MRN: [id number] Date of Birth: Oct 11, 1961

## 2016-01-13 ENCOUNTER — Encounter (HOSPITAL_COMMUNITY): Payer: BC Managed Care – PPO

## 2016-01-17 ENCOUNTER — Encounter (HOSPITAL_COMMUNITY): Payer: Self-pay

## 2016-01-17 ENCOUNTER — Ambulatory Visit (HOSPITAL_COMMUNITY): Payer: Medicare Other | Attending: Orthopedic Surgery

## 2016-01-17 DIAGNOSIS — M25661 Stiffness of right knee, not elsewhere classified: Secondary | ICD-10-CM | POA: Insufficient documentation

## 2016-01-17 DIAGNOSIS — R269 Unspecified abnormalities of gait and mobility: Secondary | ICD-10-CM | POA: Diagnosis present

## 2016-01-17 DIAGNOSIS — Z96651 Presence of right artificial knee joint: Secondary | ICD-10-CM | POA: Insufficient documentation

## 2016-01-17 DIAGNOSIS — M6281 Muscle weakness (generalized): Secondary | ICD-10-CM | POA: Diagnosis present

## 2016-01-17 DIAGNOSIS — M25561 Pain in right knee: Secondary | ICD-10-CM | POA: Diagnosis present

## 2016-01-17 NOTE — Therapy (Signed)
[facility] [address] [place], [place], [address] Phone: [phone]   Fax:  [phone]  Physical Therapy Treatment  Patient Details  Name: [name] MRN: [id number] Date of Birth: [birth year] Referring Provider: Dr. Salvatore Marvel   Encounter Date: 01/17/2016      PT End of Session - 01/17/16 1114    Visit Number 16   Number of Visits 18   Date for PT Re-Evaluation 01/21/16   Authorization Type Medicare/ BCBS ( G-codes updated on 02-15-23 visit)   Authorization Time Period 11/25/2015 to 2016/02/15   Authorization - Visit Number 16   Authorization - Number of Visits 20   PT Start Time 1102   PT Stop Time 1151   PT Time Calculation (min) 49 min   Activity Tolerance Patient tolerated treatment well   Behavior During Therapy [facility] for tasks assessed/performed      Past Medical History  Diagnosis Date  . IBS (irritable bowel syndrome)     mixed  . Allergic rhinitis     uses Flonase daily as needed and takes CLaritin daily  . Eczema   . Plaque psoriasis   . Cerebral vascular malformation     sees dr Lovell Sheehan for monitoring as needed, sees dr lewitt for headaches every 4 months  . Lactose intolerance   . Anxiety     takes Xanax daily as needed  . Asthma     Albuterol inhaler prn;SYmbicort daily  . Depression     takes Citalopram daily  . Rheumatoid arthritis(714.0) 2010    oa and ra;Rhemicade IV every 6wks and Metotrexate weekly  . Nausea     takes Zofran daily as needed  . GERD (gastroesophageal reflux disease)     takes Protonix daily  . Hyperlipidemia     takes Pravastatin daily  . PONV (postoperative nausea and vomiting)   . Shortness of breath     with exertion  . Pneumonia 4yrs ago    hx of  . History of bronchitis as a child   . History of migraine     last one 10+yrs ago  . Stroke (HCC) 03/26/2013    left sided weakness  . Joint pain   . Joint swelling   . Fibromyalgia   . Diverticulitis at age 5  .  History of kidney stones   . Thyroid cyst   . Insomnia   . History of staph infection 24yrs ago  . Ventricular septal defect     2014 TEE lists large right to left shunt ASD/PFO (NO VSD mentioned)  . Sleep apnea     study done >31yrs ago;uses CPAP nightly  . PFO (patent foramen ovale)   . Seizures (HCC) 49months ago 03/21/14    takes Depakote daily  . Primary localized osteoarthritis of right knee 11/03/2015    Past Surgical History  Procedure Laterality Date  . Incontinence surgery  2010    sling done   . Knee arthroscopy  1992    left  . Foot surgery  1999    right ankle  . Kidney stone surgery  2001  . Appendectomy  1981  . Cholecystectomy  1981  . Tubal ligation  1990  . Total abdominal hysterectomy  2001  . Hernia repair  2006  . Left foot plating and scarping for arthritis  2011  . Right ear tube insertion  2011  . Incisional hernia repair  05/20/2012    Procedure: HERNIA REPAIR INCISIONAL;  Surgeon: Maisie Fus  Liberty Handy, MD;  Location: [facility] ORS;  Service: General;  Laterality: N/A;  . Tee without cardioversion N/A 05/15/2013    Procedure: TRANSESOPHAGEAL ECHOCARDIOGRAM (TEE);  Surgeon: Pamella Pert, MD;  Location: [facility] ENDOSCOPY;  Service: Cardiovascular;  Laterality: N/A;  . Cardiac catheterization  2004  . Colonoscopy    . Thryoid biopsy    . Tonsillectomy and adenoidectomy  04/01/2014  . Tonsillectomy and adenoidectomy Bilateral 04/01/2014    Procedure: BILATERAL TONSILLECTOMY AND ADENOIDECTOMY;  Surgeon: Darletta Moll, MD;  Location: [facility] OR;  Service: ENT;  Laterality: Bilateral;  . Total knee arthroplasty Right 11/03/2015  . Total knee arthroplasty Right 11/03/2015    Procedure: RIGHT TOTAL KNEE ARTHROPLASTY/RIGHT;  Surgeon: Salvatore Marvel, MD;  Location: [facility] OR;  Service: Orthopedics;  Laterality: Right;    There were no vitals filed for this visit.  Visit Diagnosis:  Status post total right knee replacement  Stiffness of knee joint, right  Abnormality of gait  Pain in  joint of right knee  Muscle weakness      Subjective Assessment - 01/17/16 1107    Subjective Pt reported that her R knee "keeps getting better" with R knee pain rated a 0/10 on a VAS upon arrival. Pt reports that sleeping continues to be difficult and limited due to increased R knee stiffness and pain at night.    Pertinent History CVA, DJD, RA, chest pain, and seizures    Limitations Standing;Walking   Patient Stated Goals Pt's main goal is to improve her strength, improve her general mobility, and return to leisure activities including hiking and swimming   Currently in Pain? No/denies   Pain Score --  R knee pain has ranged between a 0-3/10 on a VAS   Pain Location Knee   Pain Orientation Right;Lateral   Pain Descriptors / Indicators Aching   Pain Type Surgical pain   Pain Onset More than a month ago   Pain Frequency Intermittent   Aggravating Factors  while sleeping and with max R knee flexion/extension    Pain Relieving Factors walking, changing position, pain meds    Effect of Pain on Daily Activities long distance ambulation and prolonged standing                          [facility] Adult PT Treatment/Exercise - 01/17/16 0001    Knee/Hip Exercises: Stretches   Passive Hamstring Stretch Right;3 reps;30 seconds   Passive Hamstring Stretch Limitations standing with 12 inch step   Knee: Self-Stretch to increase Flexion Right;10 seconds   Knee: Self-Stretch Limitations 10 reps on second step   Knee/Hip Exercises: Aerobic   Recumbent Bike L3 x   Knee/Hip Exercises: Standing   Heel Raises Both;20 reps;1 set   Lateral Step Up Right;2 sets;10 reps;Hand Hold: 1;Step Height: 6"   Forward Step Up Right;Step Height: 6";Hand Hold: 1;2 sets;10 reps   Functional Squat 1 set;10 reps   Functional Squat Limitations with UE support   limited by L knee pain    Knee/Hip Exercises: Seated   Long Arc Quad Strengthening;10 reps;2 sets;Right   Long Arc Quad Limitations 1 set  with red TB; 1 set with green TB    Hamstring Curl Strengthening;Right;10 reps;2 sets   Hamstring Limitations red TB   Knee/Hip Exercises: Supine   Bridges with [employer] Strengthening;Both;1 set;15 reps   Knee Extension AAROM   Knee Extension Limitations 5 degrees    Knee Flexion AAROM   Knee  Flexion Limitations 125 degrees    Manual Therapy   Manual Therapy Joint mobilization   Manual therapy comments Performed separately from all other interventions   Joint Mobilization R patellar mobs in S<>I and M<>L using grades I-IV; grade I-III PA femoral jt mobs with pt in supine;                 PT Education - 01/17/16 1113    Education provided Yes   Education Details Current HEP and heel to toe gait seq/pattern   Person(s) Educated Patient   Methods Explanation;Demonstration   Comprehension Verbalized understanding;Returned demonstration          PT Short Term Goals - 12/22/15 1139    PT SHORT TERM GOAL #1   Title Patient will be independent with TKA protocol HEP in order to further progress with ROM and strength at home.   Time 2   Period Weeks   Status Achieved   PT SHORT TERM GOAL #2   Title Patient will report reduced max R knee pain to 6/10 on a VAS in order to improve tolerance with ther ex and ambulation.    Baseline R knee pain ranges between a 0-8/10 on a VAS    Time 3   Period Weeks   Status On-going   PT SHORT TERM GOAL #3   Title Patient will demo improved R knee AAROM to 0-105 degrees in order to improve R knee mobility and ROM with ambulation and transfers.    Baseline R knee AAROM=5-124 degrees    Time 3   Period Weeks   Status On-going   PT SHORT TERM GOAL #4   Title Patient will independently verbalize pain management strategies including use of cryotherapy in order to manage pain levels with ADLs and functional mobility activities.    Time 2   Period Weeks   Status Achieved           PT Long Term Goals - 12/22/15 1141    PT LONG TERM  GOAL #1   Title Patient will report reduced R knee pain to 0-4/10 on a VAS in order to improve tolerance with ther ex and ambulation.    Baseline R knee pain ranges between a 0-8/10 on a VAS, but pain levels are mainly exacerbated with max R knee flexion and extension;    Time 7   Period Weeks   Status On-going   PT LONG TERM GOAL #2   Title Patient will demo improved R knee AAROM to 0-120 degrees in order to improve R knee mobility and ROM with stair climbing and leisure activities.    Time 7   Period Weeks   Status On-going   PT LONG TERM GOAL #3   Title Patient will demo improved R quad and HS strength to >4/5 MMT in order to improve performance with outdoor ambulation and transfers.    Baseline R HS strength=4-/5 MMT; R quad strength= 4/5 MMT    Time 7   Period Weeks   Status On-going   PT LONG TERM GOAL #4   Title TUG time will improve to <14 seconds without the use of a SPC in order to reduce the risk for falls.   Baseline TUG with SPC=14 seconds    Time 7   Period Weeks   Status Achieved   PT LONG TERM GOAL #5   Title Patient will independently ambulate on unlevel terrain for 15 minutes without the use of an AD with improved heel to  toe sequence and improved R TKE at initial contact in order to progress towards a typical gait pattern.    Time 7   Period Weeks   Status On-going               Plan - 01/17/16 1114    Clinical Impression Statement PT tx was focused on R knee AAROM/AROM ther ex into extension, quad/HS/hip strengthening, patellar/tibiofemoral mobs, and LE stretches. R knee AAROM measured 5-125 degrees post manual therapy techniques. Patellar mobility continues to be limited in S<>I direction, which slightly improved once mobs completed. Reviewed proper heel to toe gait pattern with focus on R TKE at initial contact. Pt was able to demo improved gait pattern, but is inconsistent from visit to visit. Progressed resistance with R LAQ from red to green TB with  fair tolerance reported. Pt was limited with squats secondary to increased L knee pain. Pt is progressing towards stated goals with improved R knee mobility/ROM and less severe R knee pain. R knee pain was rated a 2/10 on a VAS at the end of PT tx. Pt continues to present with decreased R knee mobility into extension and minor strength deficits assessed of R quad/HS. Continue with current POC with focus on R knee ROM/mobility ther ex, quad/HS/hip strengthening, gait training, and manual therapy techniques.    Pt will benefit from skilled therapeutic intervention in order to improve on the following deficits Abnormal gait;Decreased range of motion;Difficulty walking;Pain;Decreased balance;Hypomobility;Impaired flexibility;Increased edema;Decreased strength;Decreased mobility   Rehab Potential Good   PT Frequency Other (comment)  3x/week for 4 weeks and 2x/week for 4 weeks   PT Duration Other (comment)  3x/week for 4 weeks and 2x/week for 4 weeks   PT Treatment/Interventions ADLs/Self Care Home Management;Cryotherapy;Electrical Stimulation;Moist Heat;Balance training;Therapeutic exercise;Therapeutic activities;Functional mobility training;Stair training;Gait training;DME Instruction;Neuromuscular re-education;Patient/family education;Manual techniques;Taping;Passive range of motion;Scar mobilization   PT Next Visit Plan Complete Reassessment at next PT visit. Continue with ROM/strength progression with focus on R TKE, include functional hip/knee strengthening. Continue with manual therapy techniques to imporve R TKE.    PT Home Exercise Plan Reviewed HEP with no changes made this visit    Consulted and Agree with Plan of Care Patient        Problem List Patient Active Problem List   Diagnosis Date Noted  . Primary localized osteoarthritis of right knee 11/03/2015  . DJD (degenerative joint disease) of knee 11/03/2015  . Rheumatoid arthritis (HCC)   . PONV (postoperative nausea and vomiting)   .  Seizures (HCC)   . Fibromyalgia   . Sleep apnea   . Ventricular septal defect   . History of staph infection   . Refractory migraine 08/10/2014  . Complex partial epilepsy (HCC) 08/10/2014  . S/P tonsillectomy and adenoidectomy 04/01/2014  . Panic anxiety syndrome 06/03/2013  . Positive depression screening 06/03/2013  . Headache(784.0) 06/03/2013  . Unspecified cerebral artery occlusion with cerebral infarction 06/03/2013  . Headache 05/29/2013  . Patent foramen ovale 05/29/2013  . Chest pain 05/29/2013  . Other convulsions 05/29/2013  . Dysphagia, unspecified(787.20) 04/17/2013  . Acute bronchitis 03/27/2013  . Persistent headaches 03/26/2013  . CVA (cerebral infarction) history 03/26/2013  . Hypokalemia 03/26/2013  . Leukocytosis 03/26/2013  . Arthritis 03/26/2013  . QT prolongation 03/26/2013  . Stroke (HCC) 03/26/2013  . Recurrent ventral incisional hernia 04/19/2012  . Obstructive sleep apnea 08/22/2010  . RESTLESS LEGS SYNDROME 08/22/2010  . Moderate intermittent asthma without complication 08/22/2010  . ECZEMA 08/22/2010  . Irritable bowel  syndrome 06/23/2010  . DIARRHEA 06/23/2010  . WEIGHT LOSS 11/01/2009  . NAUSEA WITH VOMITING 11/01/2009  . ABDOMINAL PAIN, LOWER 11/01/2009  . KNEE, ARTHRITIS, DEGEN./OSTEO 11/18/2008  . DERANGEMENT MENISCUS 11/02/2008  . JOINT EFFUSION, KNEE 11/02/2008  . KNEE PAIN 11/02/2008    Bonnee Quin, PT,DPT   01/17/2016, 11:52 AM  [place] [facility] [address] [place], [place], [address] Phone: [phone]   Fax:  [phone]  Name: [name] MRN: [id number] Date of Birth: [birth year]

## 2016-01-19 ENCOUNTER — Ambulatory Visit (HOSPITAL_COMMUNITY): Payer: Medicare Other

## 2016-01-19 ENCOUNTER — Encounter (HOSPITAL_COMMUNITY): Payer: Self-pay

## 2016-01-19 DIAGNOSIS — R269 Unspecified abnormalities of gait and mobility: Secondary | ICD-10-CM

## 2016-01-19 DIAGNOSIS — Z96651 Presence of right artificial knee joint: Secondary | ICD-10-CM | POA: Diagnosis not present

## 2016-01-19 DIAGNOSIS — M25661 Stiffness of right knee, not elsewhere classified: Secondary | ICD-10-CM

## 2016-01-19 DIAGNOSIS — M6281 Muscle weakness (generalized): Secondary | ICD-10-CM

## 2016-01-19 DIAGNOSIS — M25561 Pain in right knee: Secondary | ICD-10-CM

## 2016-01-19 NOTE — Therapy (Signed)
[facility] [address] [place], [place], [address] Phone: [phone]   Fax:  [phone]  Physical Therapy Treatment/ Discharge Summary   Patient Details  Name: [name] MRN: [id number] Date of Birth: September 12, 1961 Referring Provider: Dr. Salvatore Marvel   Encounter Date: 01/19/2016      PT End of Session - 01/19/16 1021    Visit Number 17   Number of Visits 18   Authorization Type Medicare/ BCBS   Authorization Time Period 11/25/2015 to 01/24/2016   Authorization - Visit Number 17   Authorization - Number of Visits 20   PT Start Time 1020   PT Stop Time 1104   PT Time Calculation (min) 44 min   Activity Tolerance Patient tolerated treatment well   Behavior During Therapy [facility] for tasks assessed/performed      Past Medical History  Diagnosis Date  . IBS (irritable bowel syndrome)     mixed  . Allergic rhinitis     uses Flonase daily as needed and takes CLaritin daily  . Eczema   . Plaque psoriasis   . Cerebral vascular malformation     sees dr Lovell Sheehan for monitoring as needed, sees dr lewitt for headaches every 4 months  . Lactose intolerance   . Anxiety     takes Xanax daily as needed  . Asthma     Albuterol inhaler prn;SYmbicort daily  . Depression     takes Citalopram daily  . Rheumatoid arthritis(714.0) 2010    oa and ra;Rhemicade IV every 6wks and Metotrexate weekly  . Nausea     takes Zofran daily as needed  . GERD (gastroesophageal reflux disease)     takes Protonix daily  . Hyperlipidemia     takes Pravastatin daily  . PONV (postoperative nausea and vomiting)   . Shortness of breath     with exertion  . Pneumonia 41yrs ago    hx of  . History of bronchitis as a child   . History of migraine     last one 10+yrs ago  . Stroke (HCC) 03/26/2013    left sided weakness  . Joint pain   . Joint swelling   . Fibromyalgia   . Diverticulitis at age 29  . History of kidney stones   . Thyroid cyst   . Insomnia    . History of staph infection 35yrs ago  . Ventricular septal defect     2014 TEE lists large right to left shunt ASD/PFO (NO VSD mentioned)  . Sleep apnea     study done >48yrs ago;uses CPAP nightly  . PFO (patent foramen ovale)   . Seizures (HCC) 90months ago 03/21/14    takes Depakote daily  . Primary localized osteoarthritis of right knee 11/03/2015    Past Surgical History  Procedure Laterality Date  . Incontinence surgery  2010    sling done   . Knee arthroscopy  1992    left  . Foot surgery  1999    right ankle  . Kidney stone surgery  2001  . Appendectomy  1981  . Cholecystectomy  1981  . Tubal ligation  1990  . Total abdominal hysterectomy  2001  . Hernia repair  2006  . Left foot plating and scarping for arthritis  2011  . Right ear tube insertion  2011  . Incisional hernia repair  05/20/2012    Procedure: HERNIA REPAIR INCISIONAL;  Surgeon: Clovis Pu. Cornett, MD;  Location: [facility];  Service: General;  Laterality: N/A;  . Tee without cardioversion N/A 05/15/2013    Procedure: TRANSESOPHAGEAL ECHOCARDIOGRAM (TEE);  Surgeon: Laverda Page, MD;  Location: [place];  Service: Cardiovascular;  Laterality: N/A;  . Cardiac catheterization  2004  . Colonoscopy    . Thryoid biopsy    . Tonsillectomy and adenoidectomy  04/01/2014  . Tonsillectomy and adenoidectomy Bilateral 04/01/2014    Procedure: BILATERAL TONSILLECTOMY AND ADENOIDECTOMY;  Surgeon: Ascencion Dike, MD;  Location: [facility];  Service: ENT;  Laterality: Bilateral;  . Total knee arthroplasty Right 11/03/2015  . Total knee arthroplasty Right 11/03/2015    Procedure: RIGHT TOTAL KNEE ARTHROPLASTY/RIGHT;  Surgeon: Elsie Saas, MD;  Location: [facility];  Service: Orthopedics;  Laterality: Right;    There were no vitals filed for this visit.  Visit Diagnosis:  Muscle weakness  Pain in joint of right knee  Abnormality of gait  Stiffness of knee joint, right  Status post total right knee replacement      Subjective  Assessment - 01/19/16 1129    Subjective Patient reported an 80-85% improvement since beginning with outpatient PT. Patient reported that she is happy with her progress and is motivated to continue independently with her HEP; therefore, patient requested to be discharged from outpatient PT as of today. R knee pain was rated a 0/10 on a VAS. She reports that ambulating long distances is easier and that her R knee "is not as stiff anymore".   Pertinent History CVA, DJD, RA, chest pain, and seizures    Limitations Standing;Walking   How long can you sit comfortably? 20 minutes    How long can you stand comfortably? fatigue with > 18 minutes    How long can you walk comfortably? fatigue with > 18 minutes    Diagnostic tests N/A   Patient Stated Goals Pt's main goal is to improve her strength, improve her general mobility, and return to leisure activities including hiking and swimming   Currently in Pain? No/denies   Pain Score --  R knee pain currently ranges between a 0-5/10 on a VAS   Pain Location Knee   Pain Orientation Right;Anterior;Lateral   Pain Descriptors / Indicators Aching   Pain Type Surgical pain   Pain Radiating Towards None   Pain Onset More than a month ago   Pain Frequency Intermittent   Aggravating Factors  max R knee flexion/extension    Pain Relieving Factors ice, pain meds, prescribed stretches, and ROM ther ex with avoidance of end-range    Effect of Pain on Daily Activities long distance ambulation    Multiple Pain Sites No            [facility] PT Assessment - 01/19/16 0001    Assessment   Medical Diagnosis s/p R TKA   Referring Provider Dr. Elsie Saas    Onset Date/Surgical Date 11/03/15   Hand Dominance Right   Next MD Visit January 29, 2016   Prior Therapy Home health physical therapy post op    Precautions   Precautions Fall   Restrictions   Weight Bearing Restrictions No   [place] residence   Living Arrangements  Spouse/significant other   Available Help at Discharge Family   Type of [place] to enter   Entrance Stairs-Number of Steps 3   [place] One level   [place] - 2 wheels;Cane - single point;Other (comment);Bedside commode;Shower seat  Prior Function   Vocation On disability   Leisure hiking, walking, and swimming    Cognition   Overall Cognitive Status Within Functional Limits for tasks assessed   Attention Focused   Focused Attention Appears intact   Observation/Other Assessments-Edema    Edema Circumferential  Joint line= R knee edema= 39.5 cm ; L knee edema= 39.0 cm    AROM   Right Knee Extension 5   Right Knee Flexion 120   PROM   Right Knee Extension 5   Right Knee Flexion 125   Strength   Right Hip Flexion 4/5   Right Hip Extension 4/5   Right Hip ABduction 4/5   Left Hip Flexion 4+/5   Left Hip Extension 4+/5   Left Hip ABduction 4+/5   Right Knee Flexion 4+/5   Right Knee Extension 4+/5   Left Knee Flexion 5/5   Left Knee Extension 5/5   Right Ankle Dorsiflexion 5/5   Right Ankle Plantar Flexion 5/5   Left Ankle Dorsiflexion 5/5   Left Ankle Plantar Flexion 5/5   Palpation   Patella mobility Minor limitations with R patellar mobility in S<>I direction    Ambulation/Gait   Ambulation/Gait Yes   Ambulation/Gait Assistance 7: Independent   Ambulation Distance (Feet) 250 Feet   Assistive device None   Gait Pattern Step-through pattern  Limited R TKE at initial contact    Stairs Yes   Stairs Assistance 6: Modified independent (Device/Increase time)   Stair Management Technique Two rails;Other (comment)  reciprocal pattern    Height of Stairs 6   Timed Up and Go Test   Normal TUG (seconds) 13  without an AD            [facility] Adult PT Treatment/Exercise - 01/19/16 0001    Knee/Hip Exercises: Stretches   Passive Hamstring Stretch Right;3 reps;30 seconds   Passive Hamstring Stretch  Limitations standing with 12 inch step   Knee: Self-Stretch to increase Flexion Right;10 seconds   Knee: Self-Stretch Limitations 10 reps on second step   Knee/Hip Exercises: Aerobic   Recumbent Bike L3 x 58mn   Knee/Hip Exercises: Standing   Heel Raises Both;20 reps;1 set   Forward Lunges Both;10 reps   Forward Lunges Limitations with UE support    Lateral Step Up Right;10 reps;Hand Hold: 1;Step Height: 6";1 set   Forward Step Up Right;Step Height: 6";Hand Hold: 1;10 reps;1 set   Functional Squat 2 sets;10 reps   Functional Squat Limitations with UE support    Knee/Hip Exercises: Supine   Quad Sets Limitations Reviewed   Heel Slides AROM;1 set;20 reps   Heel Prop for Knee Extension Limitations Reviewed   Knee/Hip Exercises: Prone   Prone Knee Hang Limitations Reviewed                PT Education - 01/19/16 1030    Education provided Yes   Education Details Current/updated HEP, heel to toe gait pattern, and pain management strategies    Person(s) Educated Patient   Methods Explanation;Demonstration;Handout;Verbal cues   Comprehension Verbalized understanding;Returned demonstration          PT Short Term Goals - 01/19/16 1028    PT SHORT TERM GOAL #1   Title Patient will be independent with TKA protocol HEP in order to further progress with ROM and strength at home.   Time 2   Period Weeks   Status Achieved   PT SHORT TERM GOAL #2   Title Patient will report reduced max R knee  pain to 6/10 on a VAS in order to improve tolerance with ther ex and ambulation.    Baseline R knee pain ranges between a 0-5/10 on a VAS    Time 3   Period Weeks   Status Partially Met   PT SHORT TERM GOAL #3   Title Patient will demo improved R knee AAROM to 0-105 degrees in order to improve R knee mobility and ROM with ambulation and transfers.    Baseline R knee AAROM=5-125 degrees; R knee AROM=5-120 degrees    Time 3   Period Weeks   Status Partially Met   PT SHORT TERM GOAL #4    Title Patient will independently verbalize pain management strategies including use of cryotherapy in order to manage pain levels with ADLs and functional mobility activities.    Time 2   Period Weeks   Status Achieved           PT Long Term Goals - 01/19/16 1028    PT LONG TERM GOAL #1   Title Patient will report reduced R knee pain to 0-4/10 on a VAS in order to improve tolerance with ther ex and ambulation.    Baseline R knee pain ranges between a 0-5/10 on a VAS    Time 7   Period Weeks   Status Partially Met   PT LONG TERM GOAL #2   Title Patient will demo improved R knee AAROM to 0-120 degrees in order to improve R knee mobility and ROM with stair climbing and leisure activities.    Baseline R knee AAROM=5-125 degrees; R knee AROM=5-120 degrees    Time 7   Period Weeks   Status Partially Met   PT LONG TERM GOAL #3   Title Patient will demo improved R quad and HS strength to >4/5 MMT in order to improve performance with outdoor ambulation and transfers.    Baseline R HS strength=4+/5 MMT; R quad strength= 4+/5 MMT    Time 7   Period Weeks   Status Achieved   PT LONG TERM GOAL #4   Title TUG time will improve to <14 seconds without the use of a SPC in order to reduce the risk for falls.   Baseline TUG without AD=13 seconds    Time 7   Period Weeks   Status Achieved   PT LONG TERM GOAL #5   Title Patient will independently ambulate on unlevel terrain for 15 minutes without the use of an AD with improved heel to toe sequence and improved R TKE at initial contact in order to progress towards a typical gait pattern.    Baseline Patient is able to ambulate independently without an AD for 15 minutes with improved heel to toe sequence and improved cadence; however, she continues to present with minor deficits with R TKE at initial contact.   Time 7   Period Weeks   Status Achieved               Plan - 01/19/16 1021    Clinical Impression Statement [name] is a  55 yo female who has been seen for a total of 17 PT visits s/p R TKA. The patient has made excellent progress towards stated goals with improved R knee ROM, improved strength, decreased R knee pain, improved balance, and improved heel to toe gait pattern without an AD. Patient continues to present with limited R knee extension AROM/PROM, which was measured to 5-125 degrees. TUG time improved to 13 sec with good balance  assessed with ambulation. Patient is able to ambulate without an AD, but continues to present with decreased R knee flexion during swing phase and R TKE at initial contact. Patient is able to demo an improved gait pattern once cues provided. Her pain levels are well managed at this time, but she continues to experience minor to moderate pain levels with max R knee extension and flexion. Patient is discharged from outpatient PT at this time per patient request secondary to improved functional status and being happy with her progress. The patient is scheduled to have her L TKA completed on 02/07/2016 by Dr. Elsie Saas. Therapist thoroughly reviewed previous HEP and updated HEP with a new handout provided this visit. Patient demo good understanding with previous and updated HEP.    Pt will benefit from skilled therapeutic intervention in order to improve on the following deficits Abnormal gait;Decreased range of motion;Difficulty walking;Pain;Decreased balance;Hypomobility;Impaired flexibility;Increased edema;Decreased strength;Decreased mobility   Rehab Potential Good   PT Frequency  Patient DC from PT per patient request secondary to improved functional status and being happy with her progress   PT Duration  Patient DC from PT per patient request secondary to improved functional status and being happy with her progress   PT Treatment/Interventions ADLs/Self Care Home Management;Cryotherapy;Electrical Stimulation;Moist Heat;Balance training;Therapeutic exercise;Therapeutic activities;Functional  mobility training;Stair training;Gait training;DME Instruction;Neuromuscular re-education;Patient/family education;Manual techniques;Taping;Passive range of motion;Scar mobilization   PT Next Visit Plan Patient DC from PT per patient request secondary to improved functional status and being happy with her progress;    PT Home Exercise Plan Reviewed current and updated HEP with new handout provided: Updated HEP includes squats, ant/lat step-ups, forward lunges, and standing HS stretch   Consulted and Agree with Plan of Care Patient          G-Codes - 02-03-2016 1118    Functional Assessment Tool Used Clinical findings including MMT, ROM, pain levels, TUG, and edema measurements    Functional Limitation Mobility: Walking and moving around   Mobility: Walking and Moving Around Goal Status [phone]) At least 20 percent but less than 40 percent impaired, limited or restricted   Mobility: Walking and Moving Around Discharge Status [phone]) At least 20 percent but less than 40 percent impaired, limited or restricted      Problem List Patient Active Problem List   Diagnosis Date Noted  . Primary localized osteoarthritis of right knee 11/03/2015  . DJD (degenerative joint disease) of knee 11/03/2015  . Rheumatoid arthritis ([place])   . PONV (postoperative nausea and vomiting)   . Seizures ([place])   . Fibromyalgia   . Sleep apnea   . Ventricular septal defect   . History of staph infection   . Refractory migraine 08/10/2014  . Complex partial epilepsy ([place]) 08/10/2014  . S/P tonsillectomy and adenoidectomy 04/01/2014  . Panic anxiety syndrome 06/03/2013  . Positive depression screening 06/03/2013  . Headache(784.0) 06/03/2013  . Unspecified cerebral artery occlusion with cerebral infarction 06/03/2013  . Headache 05/29/2013  . Patent foramen ovale 05/29/2013  . Chest pain 05/29/2013  . Other convulsions 05/29/2013  . Dysphagia, unspecified(787.20) 04/17/2013  . Acute bronchitis 03/27/2013  .  Persistent headaches 03/26/2013  . CVA (cerebral infarction) history 03/26/2013  . Hypokalemia 03/26/2013  . Leukocytosis 03/26/2013  . Arthritis 03/26/2013  . QT prolongation 03/26/2013  . Stroke ([place]) 03/26/2013  . Recurrent ventral incisional hernia 04/19/2012  . Obstructive sleep apnea 08/22/2010  . RESTLESS LEGS SYNDROME 08/22/2010  . Moderate intermittent asthma without complication 01/77/9390  . ECZEMA  08/22/2010  . Irritable bowel syndrome 06/23/2010  . DIARRHEA 06/23/2010  . WEIGHT LOSS 11/01/2009  . NAUSEA WITH VOMITING 11/01/2009  . ABDOMINAL PAIN, LOWER 11/01/2009  . KNEE, ARTHRITIS, DEGEN./OSTEO 11/18/2008  . DERANGEMENT MENISCUS 11/02/2008  . JOINT EFFUSION, KNEE 11/02/2008  . KNEE PAIN 11/02/2008   PHYSICAL THERAPY DISCHARGE SUMMARY  Visits from Start of Care: 17  Current functional level related to goals / functional outcomes: See above    Remaining deficits: See above    Education / Equipment: See above   Plan: Patient agrees to discharge.  Patient goals were partially met. Patient is being discharged due to the patient's request.  ?????       Garen Lah, PT, DPT   01/19/2016, 11:33 AM  [facility] [place], [place], [address] Phone: [phone]   Fax:  [phone]  Name: [name] MRN: [id number] Date of Birth: 10/11/1961

## 2016-01-20 ENCOUNTER — Ambulatory Visit (HOSPITAL_COMMUNITY): Payer: BC Managed Care – PPO

## 2016-01-23 IMAGING — DX DG CHEST 2V
2 series · 2 of 2 positions shown · non-contrast
Comparison: 03/20/2014

CLINICAL DATA: Shortness of breath.

EXAM:
CHEST  2 VIEW

[chest pa]
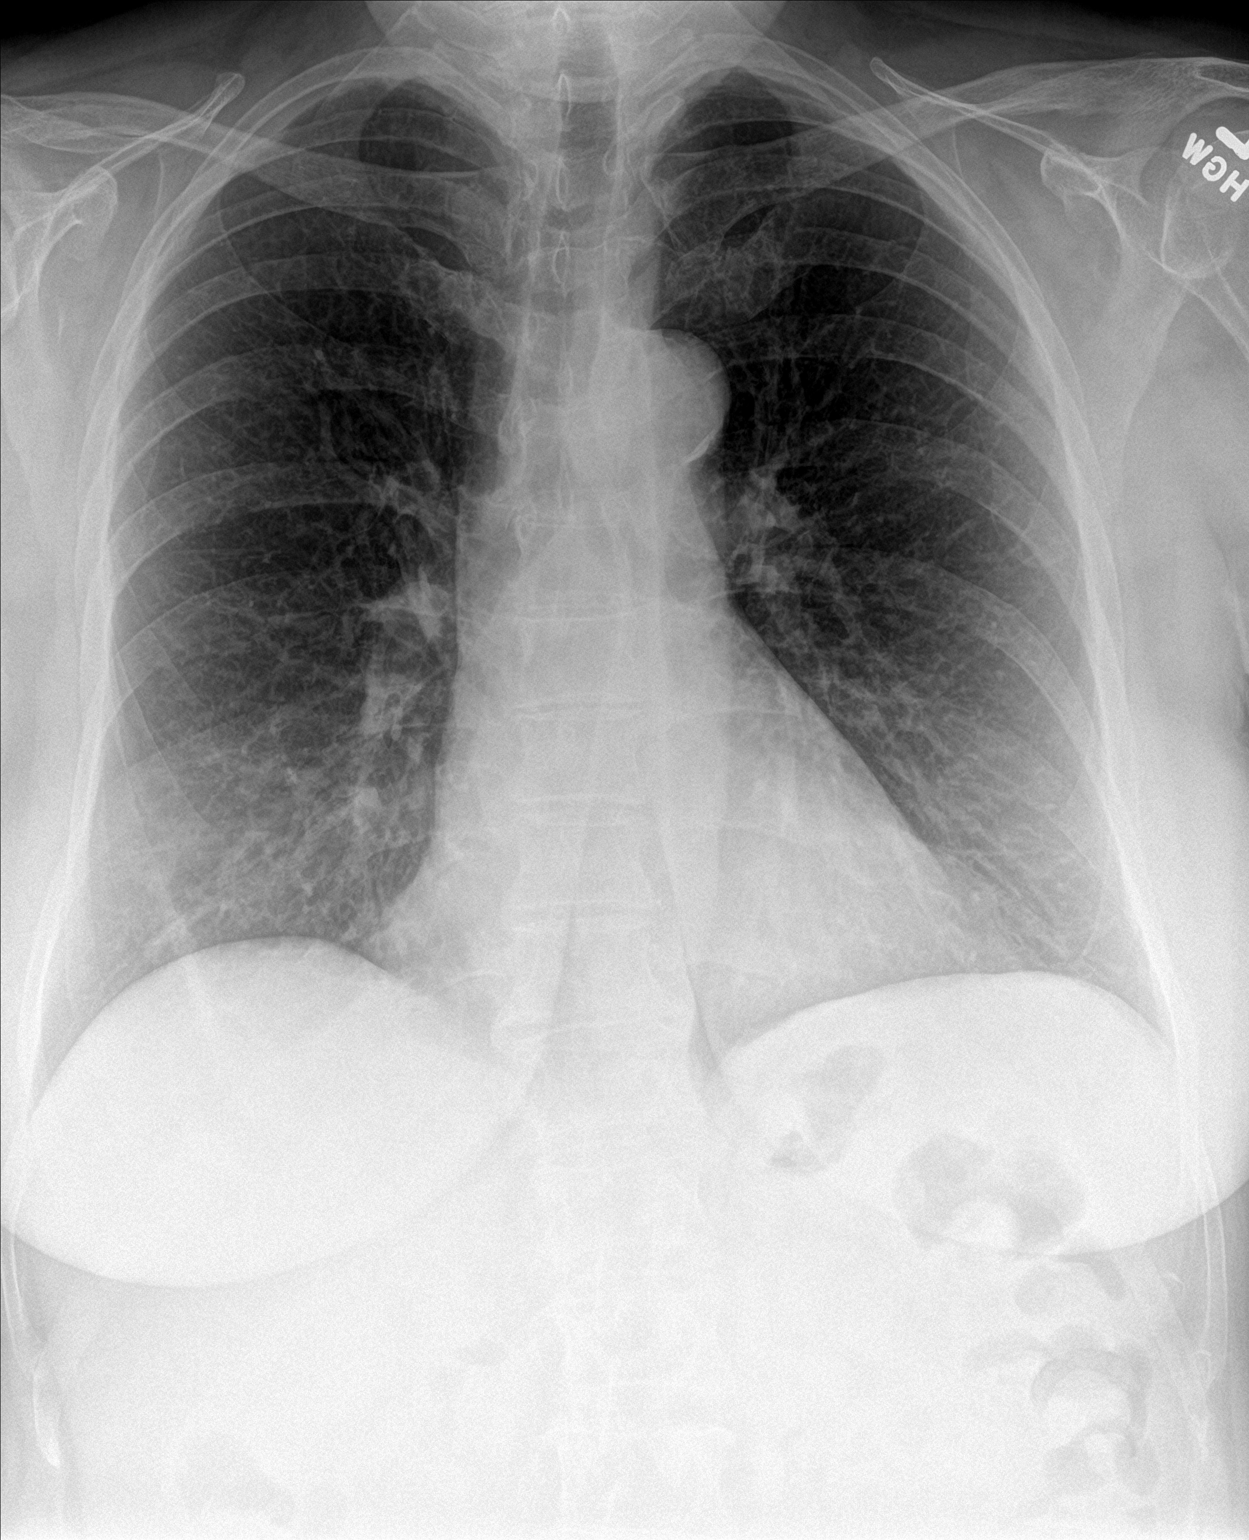

[chest lat]
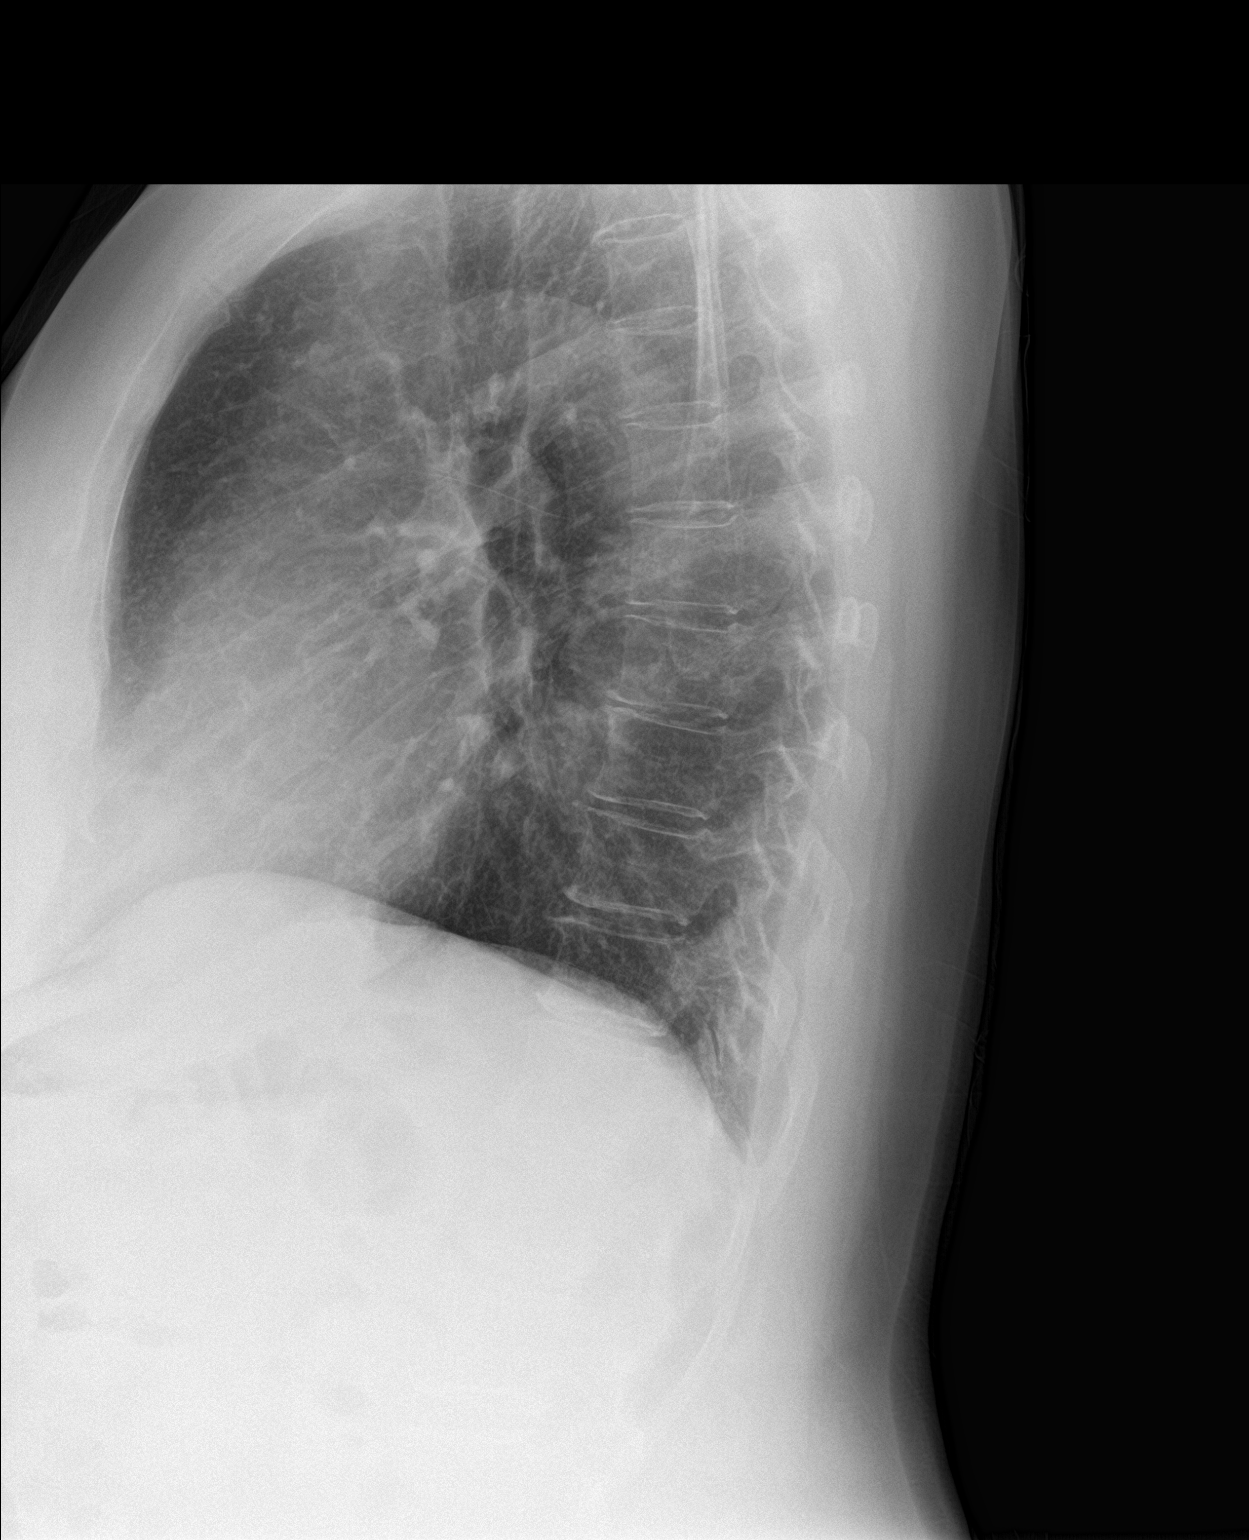

[2 of 2 positions shown; findings below may reference images not displayed]

FINDINGS: The heart size and mediastinal contours are within normal limits.
Both lungs are clear. The visualized skeletal structures are
unremarkable.
IMPRESSION: No active cardiopulmonary disease.

## 2016-01-26 ENCOUNTER — Encounter (HOSPITAL_COMMUNITY): Payer: Self-pay | Admitting: Physician Assistant

## 2016-01-26 DIAGNOSIS — M1712 Unilateral primary osteoarthritis, left knee: Secondary | ICD-10-CM | POA: Diagnosis present

## 2016-01-26 NOTE — H&P (Signed)
TOTAL KNEE ADMISSION H&P  Patient is being admitted for left total knee arthroplasty.  Subjective:  Chief Complaint:left knee pain.  HPI: [name], 55 y.o. female, has a history of pain and functional disability in the left knee due to arthritis and has failed non-surgical conservative treatments for greater than 12 weeks to includeNSAID's and/or analgesics, corticosteriod injections, viscosupplementation injections, flexibility and strengthening excercises, supervised PT with diminished ADL's post treatment, use of assistive devices, weight reduction as appropriate and activity modification.  Onset of symptoms was gradual, starting 10 years ago with gradually worsening course since that time. The patient noted prior procedures on the knee to include  arthroscopy and menisectomy on the left knee(s).  Patient currently rates pain in the left knee(s) at 10 out of 10 with activity. Patient has night pain, worsening of pain with activity and weight bearing, pain that interferes with activities of daily living, crepitus and joint swelling.  Patient has evidence of subchondral sclerosis, periarticular osteophytes and joint space narrowing by imaging studies.  There is no active infection.  Patient Active Problem List   Diagnosis Date Noted  . Primary localized osteoarthritis of left knee   . Primary localized osteoarthritis of right knee 11/03/2015  . DJD (degenerative joint disease) of knee 11/03/2015  . Rheumatoid arthritis (HCC)   . PONV (postoperative nausea and vomiting)   . Seizures (HCC)   . Fibromyalgia   . Sleep apnea   . Ventricular septal defect   . History of staph infection   . Refractory migraine 08/10/2014  . Complex partial epilepsy (HCC) 08/10/2014  . S/P tonsillectomy and adenoidectomy 04/01/2014  . Panic anxiety syndrome 06/03/2013  . Positive depression screening 06/03/2013  . Headache(784.0) 06/03/2013  . Unspecified cerebral artery occlusion with cerebral infarction  06/03/2013  . Headache 05/29/2013  . Patent foramen ovale 05/29/2013  . Chest pain 05/29/2013  . Other convulsions 05/29/2013  . Dysphagia, unspecified(787.20) 04/17/2013  . Acute bronchitis 03/27/2013  . Persistent headaches 03/26/2013  . CVA (cerebral infarction) history 03/26/2013  . Hypokalemia 03/26/2013  . Leukocytosis 03/26/2013  . Arthritis 03/26/2013  . QT prolongation 03/26/2013  . Stroke (HCC) 03/26/2013  . Recurrent ventral incisional hernia 04/19/2012  . Obstructive sleep apnea 08/22/2010  . RESTLESS LEGS SYNDROME 08/22/2010  . Moderate intermittent asthma without complication 08/22/2010  . ECZEMA 08/22/2010  . Irritable bowel syndrome 06/23/2010  . DIARRHEA 06/23/2010  . NAUSEA WITH VOMITING 11/01/2009  . ABDOMINAL PAIN, LOWER 11/01/2009  . KNEE, ARTHRITIS, DEGEN./OSTEO 11/18/2008  . DERANGEMENT MENISCUS 11/02/2008  . JOINT EFFUSION, KNEE 11/02/2008  . KNEE PAIN 11/02/2008   Past Medical History  Diagnosis Date  . IBS (irritable bowel syndrome)     mixed  . Allergic rhinitis     uses Flonase daily as needed and takes CLaritin daily  . Eczema   . Plaque psoriasis   . Cerebral vascular malformation     sees dr Lovell Sheehan for monitoring as needed, sees dr lewitt for headaches every 4 months  . Lactose intolerance   . Anxiety     takes Xanax daily as needed  . Asthma     Albuterol inhaler prn;SYmbicort daily  . Depression     takes Citalopram daily  . Rheumatoid arthritis(714.0) 2010    oa and ra;Rhemicade IV every 6wks and Metotrexate weekly  . Nausea     takes Zofran daily as needed  . GERD (gastroesophageal reflux disease)     takes Protonix daily  . Hyperlipidemia  takes Pravastatin daily  . PONV (postoperative nausea and vomiting)   . Shortness of breath     with exertion  . Pneumonia 40yrs ago    hx of  . History of bronchitis as a child   . History of migraine     last one 10+yrs ago  . Stroke (HCC) 03/26/2013    left sided weakness  .  Joint pain   . Joint swelling   . Fibromyalgia   . Diverticulitis at age 89  . History of kidney stones   . Thyroid cyst   . Insomnia   . History of staph infection 79yrs ago  . Ventricular septal defect     2014 TEE lists large right to left shunt ASD/PFO (NO VSD mentioned)  . Sleep apnea     study done >56yrs ago;uses CPAP nightly  . PFO (patent foramen ovale)   . Seizures (HCC) 82months ago 03/21/14    takes Depakote daily  . Primary localized osteoarthritis of right knee 11/03/2015  . Primary localized osteoarthritis of left knee     Past Surgical History  Procedure Laterality Date  . Incontinence surgery  2010    sling done   . Knee arthroscopy  1992    left  . Foot surgery  1999    right ankle  . Kidney stone surgery  2001  . Appendectomy  1981  . Cholecystectomy  1981  . Tubal ligation  1990  . Total abdominal hysterectomy  2001  . Hernia repair  2006  . Left foot plating and scarping for arthritis  2011  . Right ear tube insertion  2011  . Incisional hernia repair  05/20/2012    Procedure: HERNIA REPAIR INCISIONAL;  Surgeon: Clovis Pu. Cornett, MD;  Location: [facility] ORS;  Service: General;  Laterality: N/A;  . Tee without cardioversion N/A 05/15/2013    Procedure: TRANSESOPHAGEAL ECHOCARDIOGRAM (TEE);  Surgeon: Pamella Pert, MD;  Location: [facility] ENDOSCOPY;  Service: Cardiovascular;  Laterality: N/A;  . Cardiac catheterization  2004  . Colonoscopy    . Thryoid biopsy    . Tonsillectomy and adenoidectomy  04/01/2014  . Tonsillectomy and adenoidectomy Bilateral 04/01/2014    Procedure: BILATERAL TONSILLECTOMY AND ADENOIDECTOMY;  Surgeon: Darletta Moll, MD;  Location: [facility] OR;  Service: ENT;  Laterality: Bilateral;  . Total knee arthroplasty Right 11/03/2015  . Total knee arthroplasty Right 11/03/2015    Procedure: RIGHT TOTAL KNEE ARTHROPLASTY/RIGHT;  Surgeon: Salvatore Marvel, MD;  Location: [facility] OR;  Service: Orthopedics;  Laterality: Right;    No prescriptions prior to admission    Allergies  Allergen Reactions  . Aquacel [Carboxymethylcellulose] Other (See Comments)    Blisters   . Nickel   . Sulfonamide Derivatives Anaphylaxis, Hives and Swelling    REACTION: swelling, tongue and hives  . Aspirin Nausea And Vomiting    REACTION: severe gi upset.  Pt states she can tolerate 81 mg asa only.   . Ciprofloxacin Itching and Swelling    REACTION: angioedema, urticaria  . Cosyntropin Hives and Swelling    REACTION: swelling, hives  . Adhesive [Tape] Other (See Comments)    Blisters skin  . Amoxicillin Nausea And Vomiting    REACTION: GI upset  . Guaifenesin Rash    REACTION: head rash  . Moxifloxacin Other (See Comments)    gi upset  . Nsaids Other (See Comments)    Gi upset    Social History  Substance Use Topics  . Smoking status: Former Smoker --  2.00 packs/day for 30 years    Types: Cigarettes    Quit date: 10/20/1999  . Smokeless tobacco: Never Used     Comment: quit smoking in 2002  . Alcohol Use: No    Family History  Problem Relation Age of Onset  . Diabetes Maternal Grandmother   . Arthritis Maternal Grandmother   . Diabetes Brother   . Diabetes Paternal Grandmother   . Diabetes Maternal Grandfather   . Diabetes Paternal Grandfather   . Heart disease Brother   . Heart disease Other     Uncle  . Breast cancer Maternal Aunt   . Ovarian cancer Mother      Review of Systems  Constitutional: Negative.   HENT: Negative.   Eyes: Negative.   Respiratory: Negative.   Cardiovascular: Negative.   Gastrointestinal: Negative.   Genitourinary: Negative.   Musculoskeletal: Positive for back pain and joint pain.  Skin: Negative.   Neurological: Negative.   Endo/Heme/Allergies: Negative.   Psychiatric/Behavioral: Negative.     Objective:  Physical Exam  Constitutional: She is oriented to person, place, and time. She appears well-developed and well-nourished.  HENT:  Head: Normocephalic and atraumatic.  Eyes: Conjunctivae are normal.  Pupils are equal, round, and reactive to light.  Neck: Neck supple.  Cardiovascular: Normal rate and regular rhythm.   Murmur heard. Respiratory: Effort normal and breath sounds normal.  GI: Bowel sounds are normal.  Genitourinary:  Not pertinent to current symptomatology therefore not examined.  Musculoskeletal:    She is independently ambulatory with the assistance of a cane.  The right leg surgical wound is well approximated and healed.  She has no redness, minimal swelling, and is neurovascularly intact with excellent range of motion 0 to 90 degrees.  Left knee has -5 to 120 degrees.  Varus deformity, 2+ crepitus, 2+ synovitis.  Medial joint line tenderness.  Distal neurovascularly intact.   Neurological: She is alert and oriented to person, place, and time.  Skin: Skin is warm and dry.  Psychiatric: She has a normal mood and affect. Her behavior is normal.    Vital signs in last 24 hours: Temp:  [98.3 F (36.8 C)] 98.3 F (36.8 C) (04/12 1500) Pulse Rate:  [66] 66 (04/12 1500) BP: (132)/(78) 132/78 mmHg (04/12 1500) SpO2:  [96 %] 96 % (04/12 1500) Weight:  [72.576 kg (160 lb)] 72.576 kg (160 lb) (04/12 1500)  Labs:   Estimated body mass index is 26.63 kg/(m^2) as calculated from the following:   Height as of this encounter: 5\' 5"  (1.651 m).   Weight as of this encounter: 72.576 kg (160 lb).   Imaging Review Plain radiographs demonstrate severe degenerative joint disease of the left knee(s). The overall alignment ismild varus. The bone quality appears to be good for age and reported activity level.  Assessment/Plan:  End stage arthritis, left knee  Principal Problem:   Primary localized osteoarthritis of left knee Active Problems:   Obstructive sleep apnea   RESTLESS LEGS SYNDROME   Moderate intermittent asthma without complication   Irritable bowel syndrome   CVA (cerebral infarction) history   QT prolongation   Patent foramen ovale   Panic anxiety syndrome    Complex partial epilepsy (HCC)   Rheumatoid arthritis (HCC)   Ventricular septal defect   The patient history, physical examination, clinical judgment of the provider and imaging studies are consistent with end stage degenerative joint disease of the left knee(s) and total knee arthroplasty is deemed medically necessary. The treatment options including  medical management, injection therapy arthroscopy and arthroplasty were discussed at length. The risks and benefits of total knee arthroplasty were presented and reviewed. The risks due to aseptic loosening, infection, stiffness, patella tracking problems, thromboembolic complications and other imponderables were discussed. The patient acknowledged the explanation, agreed to proceed with the plan and consent was signed. Patient is being admitted for inpatient treatment for surgery, pain control, PT, OT, prophylactic antibiotics, VTE prophylaxis, progressive ambulation and ADL's and discharge planning. The patient is planning to be discharged home with home health services

## 2016-01-27 ENCOUNTER — Encounter (HOSPITAL_COMMUNITY): Payer: Self-pay

## 2016-01-27 ENCOUNTER — Encounter (HOSPITAL_COMMUNITY)
Admission: RE | Admit: 2016-01-27 | Discharge: 2016-01-27 | Disposition: A | Discharge status: Home / Self Care | Payer: Medicare Other | Source: Ambulatory Visit | Attending: Orthopedic Surgery | Admitting: Orthopedic Surgery

## 2016-01-27 DIAGNOSIS — R569 Unspecified convulsions: Secondary | ICD-10-CM | POA: Diagnosis not present

## 2016-01-27 DIAGNOSIS — Z79899 Other long term (current) drug therapy: Secondary | ICD-10-CM | POA: Diagnosis not present

## 2016-01-27 DIAGNOSIS — F329 Major depressive disorder, single episode, unspecified: Secondary | ICD-10-CM | POA: Insufficient documentation

## 2016-01-27 DIAGNOSIS — E785 Hyperlipidemia, unspecified: Secondary | ICD-10-CM | POA: Diagnosis not present

## 2016-01-27 DIAGNOSIS — M797 Fibromyalgia: Secondary | ICD-10-CM | POA: Diagnosis not present

## 2016-01-27 DIAGNOSIS — M1712 Unilateral primary osteoarthritis, left knee: Secondary | ICD-10-CM | POA: Insufficient documentation

## 2016-01-27 DIAGNOSIS — G4730 Sleep apnea, unspecified: Secondary | ICD-10-CM | POA: Diagnosis not present

## 2016-01-27 DIAGNOSIS — G4733 Obstructive sleep apnea (adult) (pediatric): Secondary | ICD-10-CM | POA: Diagnosis not present

## 2016-01-27 DIAGNOSIS — M069 Rheumatoid arthritis, unspecified: Secondary | ICD-10-CM | POA: Diagnosis not present

## 2016-01-27 DIAGNOSIS — J45909 Unspecified asthma, uncomplicated: Secondary | ICD-10-CM | POA: Insufficient documentation

## 2016-01-27 DIAGNOSIS — Z01812 Encounter for preprocedural laboratory examination: Secondary | ICD-10-CM | POA: Insufficient documentation

## 2016-01-27 DIAGNOSIS — K219 Gastro-esophageal reflux disease without esophagitis: Secondary | ICD-10-CM | POA: Diagnosis not present

## 2016-01-27 DIAGNOSIS — Z0183 Encounter for blood typing: Secondary | ICD-10-CM | POA: Diagnosis not present

## 2016-01-27 DIAGNOSIS — Z8673 Personal history of transient ischemic attack (TIA), and cerebral infarction without residual deficits: Secondary | ICD-10-CM | POA: Insufficient documentation

## 2016-01-27 HISTORY — DX: Chronic obstructive pulmonary disease, unspecified: J44.9

## 2016-01-27 HISTORY — DX: Stress incontinence (female) (male): N39.3

## 2016-01-27 HISTORY — DX: Unspecified hearing loss, unspecified ear: H91.90

## 2016-01-27 HISTORY — DX: Other reaction to spinal and lumbar puncture: G97.1

## 2016-01-27 LAB — CBC WITH DIFFERENTIAL/PLATELET
Basophils Absolute: 0 10*3/uL (ref 0.0–0.1)
Basophils Relative: 0 %
EOS ABS: 0.1 10*3/uL (ref 0.0–0.7)
EOS PCT: 2 %
HCT: 43.2 % (ref 36.0–46.0)
HEMOGLOBIN: 14.7 g/dL (ref 12.0–15.0)
LYMPHS ABS: 2.9 10*3/uL (ref 0.7–4.0)
LYMPHS PCT: 39 %
MCH: 33.5 pg (ref 26.0–34.0)
MCHC: 34 g/dL (ref 30.0–36.0)
MCV: 98.4 fL (ref 78.0–100.0)
MONO ABS: 1 10*3/uL (ref 0.1–1.0)
MONOS PCT: 14 %
Neutro Abs: 3.3 10*3/uL (ref 1.7–7.7)
Neutrophils Relative %: 45 %
PLATELETS: 172 10*3/uL (ref 150–400)
RBC: 4.39 MIL/uL (ref 3.87–5.11)
RDW: 13.3 % (ref 11.5–15.5)
WBC: 7.3 10*3/uL (ref 4.0–10.5)

## 2016-01-27 LAB — COMPREHENSIVE METABOLIC PANEL
ALT: 10 U/L — AB (ref 14–54)
AST: 15 U/L (ref 15–41)
Albumin: 3.6 g/dL (ref 3.5–5.0)
Alkaline Phosphatase: 65 U/L (ref 38–126)
Anion gap: 13 (ref 5–15)
BUN: 7 mg/dL (ref 6–20)
CHLORIDE: 109 mmol/L (ref 101–111)
CO2: 21 mmol/L — AB (ref 22–32)
CREATININE: 0.56 mg/dL (ref 0.44–1.00)
Calcium: 9.2 mg/dL (ref 8.9–10.3)
Glucose, Bld: 93 mg/dL (ref 65–99)
POTASSIUM: 3.4 mmol/L — AB (ref 3.5–5.1)
SODIUM: 143 mmol/L (ref 135–145)
Total Bilirubin: 0.3 mg/dL (ref 0.3–1.2)
Total Protein: 6.9 g/dL (ref 6.5–8.1)

## 2016-01-27 LAB — APTT: APTT: 29 s (ref 24–37)

## 2016-01-27 LAB — PROTIME-INR
INR: 1.04 (ref 0.00–1.49)
PROTHROMBIN TIME: 13.8 s (ref 11.6–15.2)

## 2016-01-27 LAB — SURGICAL PCR SCREEN
MRSA, PCR: NEGATIVE
Staphylococcus aureus: NEGATIVE

## 2016-01-27 NOTE — Pre-Procedure Instructions (Signed)
    [name]  01/27/2016      [facility] - [place], [place] - [address] [place] [address] Phone: [phone] Fax: [phone]  [facility] - [place], [place] - [address] [place] [address] Phone: [phone] Fax: [phone]    Your procedure is scheduled on Monday, April 24th, 2017.  Report to [facility] Admitting at 7:00 A.M.   Call this number if you have problems the morning of surgery:  [phone]   Remember:  Do not eat food or drink liquids after midnight.   Take these medicines the morning of surgery with A SIP OF WATER: Albuterol inhaler if needed (please bring with you), Alprazolam (Xanax) if needed, Citalopram (Celexa), Divalproex (Depakote), Fluticasone (Flonase), Hydrocodone-Acetaminophen (Norco) if needed, Loratadine (Claritin), Metoclopramide (Reglan), Pantoprazole (Protonix), Topamax.  7 days prior to surgery, stop taking: Aspirin, NSAIDS, Aleve, Naproxen, Ibuprofen, Advil, Motrin, BC's, Goody's, Fish oil, all herbal medications, and all vitamins.     Do not wear jewelry, make-up or nail polish.  Do not wear lotions, powders, or perfumes.  You may wear deodorant.  Do not shave 48 hours prior to surgery.    Do not bring valuables to the hospital.   [facility] is not responsible for any belongings or valuables.  Contacts, dentures or bridgework may not be worn into surgery.  Leave your suitcase in the car.  After surgery it may be brought to your room.  For patients admitted to the hospital, discharge time will be determined by your treatment team.  Patients discharged the day of surgery will not be allowed to drive home.   Special instructions:  See attached.   Please read over the following fact sheets that you were given. Pain Booklet, Coughing and Deep Breathing, Blood Transfusion Information, Total Joint Packet, MRSA Information and Surgical Site  Infection Prevention

## 2016-01-27 NOTE — Progress Notes (Signed)
PCP- Dr. Benedetto Goad Cardiologist - Dr. Jacinto Halim Neurologist - Dr. Pearlean Brownie Pulmonologist - Dr. Maple Hudson  EKG - requested from Dr. Jacinto Halim CXR - 09/2015 Echo- 2014 Cath - 2004  Patient denies chest pain and shortness of breath at PAT appointment.

## 2016-01-28 NOTE — Progress Notes (Addendum)
Anesthesia Chart Review: Patient is a 55 year old female scheduled for left TKR (using oxinium prosthesis by Katrinka Blazing and Nephew due to metal allergy on 02/07/16 by Dr. Thurston Hole. She is s/p right TKR (adductor canal block with GA) on 11/03/15.   History includes former smoker, post-operative N/V, IBS, allergic rhinitis, eczema, asthma, plaque psoriasis, RA, cerebral vascular malformation (right frontal cavernoma; has seen neurosurgeon Dr. Lovell Sheehan), seizures '14, anxiety, depression, lactose intolerance, GERD, HLD, exertional dyspnea, right parietal ischemic CVA with left hemiparesis 03/2013, OSA with CPAP (Dr. Jetty Duhamel), fibromyalgia, migraines, thyroid cyst, cholecystectomy, T&A '15, incisional hernia repair '13, right TKR 11/01/15. History lists VSD but TEE only mentions a large PFO (per cardiology Dr. Jacinto Halim records, patient is a part of a randomized to the medical therapy arm of the "REDUCE" secondary prevention trial of CVA versus PFO closure. BMI 26.98.Marland Kitchen PCP is Dr. Benedetto Goad.  Prior to her 11/01/15 right TKA, she had pulmonology clearance from Dr. Maple Hudson and cardiac clearance from Dr. Jacinto Halim. He last saw her on 09/06/15 with one year follow-up recommended.   Meds include albuterol, Xanax, Celexa, Depakote, Flonase, Folvite, Norco, Claritin, methotrexate, Reglan PRN, Protonix, pravastatin, Topamax.  09/06/15 EKG [facility] CV): SB, diffuse non-specific T wave abnormality (most notable in inferior leads and anterolateral leads V3-6).   05/26/13 Nuclear stress test [facility] CV): Conclusions: Resting EKG NSR, poor r wave progression. Stess EKG non-diagnostic. Stress symptoms included stomach pain, lightheadedness, chest pain, headache, and nausea. Perfusion study demonstrated mild gut uptake artifact in both at rest and stress. No evidence of ischemia or scar. Dynamic gated images reveal normal wall motion and endocardial thickening. LVEF estimated 70%. Low risk study.  05/15/13 TEE: Study Conclusions - Left  ventricle: Systolic function was normal. Wall motion was normal; there were no regional wall motion abnormalities. - Left atrium: No evidence of thrombus in the atrial cavity or appendage. - Right atrium: No evidence of thrombus in the atrial cavity or appendage. - Atrial septum: There was a patent foramen ovale. Agitated saline contrast study showed a large right-to-left atrial level shunt, in the baseline state. Agitated saline contrast study showed a large right-to-left atrial level shunt, following an increase in RA pressure induced by provocative maneuvers. The PFO tunnel length was 1.5cm. About 25-30 bubbles noted with shunting crossing the PFO.  05/14/13 Transcranial Doppler with Bubble Study: Impression: This TCD bubble study is positive for the presence of a medium intracardiac or intrapulmonary right to left shunt. TEE ordered for verification.  03/27/13 Carotid U/S: Summary: - The vertebral arteries appear patent with antegrade flow. - Findings consistent with less than 39 percent stenosis involving the right internal carotid artery and the left internal carotid artery.  05/30/13 EEG: Impression: this is a normal awake and drowsy EEG. Please, be aware that a normal EEG does not exclude the possibility of epilepsy. Clinical correlation is advised.  10/05/15 CXR: IMPRESSION: No active cardiopulmonary disease.  11/03/13 PFT: Mild obstructive airways disease with significant response to bronchodilator, normal lung volumes, mild diffusion defect. FVC 3.02/87%, FEV1 2.60/96%, FEV1/FVC 0.86, FEF 25-75% 4.50/174%, TLC 98%, DLCO 69%.  Preoperative labs noted. Urine culture is still pending.      Patient with known large PFO and is the medical arm of the "reduce" study. She had pulmonology and cardiology clearances for similar surgery three months ago. If no acute changes then it is anticipated that she can proceed as planned.   Velna Ochs [facility]  Short Stay Center/Anesthesiology Phone [phone] 01/28/2016 1:07  PM  Addendum: Urine culture showed 40,000 colonies of Staphylococcus species (coagulase negative) (A). I left a voice message for Sherri at Dr. Sherene Sires office regarding results. Defer treatment recommendations to surgeon.  Velna Ochs [facility] Short Stay Center/Anesthesiology Phone [phone] 01/31/2016 5:31 PM

## 2016-01-29 LAB — URINE CULTURE: Culture: 40000 — AB

## 2016-02-06 MED ORDER — VANCOMYCIN HCL IN DEXTROSE 1-5 GM/200ML-% IV SOLN
1000.0000 mg | INTRAVENOUS | Status: AC
  Filled 2016-02-06: qty 200

## 2016-02-07 ENCOUNTER — Encounter (HOSPITAL_COMMUNITY): Payer: Self-pay | Admitting: Anesthesiology

## 2016-02-07 ENCOUNTER — Inpatient Hospital Stay (HOSPITAL_COMMUNITY): Payer: Medicare Other | Admitting: Anesthesiology

## 2016-02-07 ENCOUNTER — Inpatient Hospital Stay (HOSPITAL_COMMUNITY)
Admission: RE | Admit: 2016-02-07 | Discharge: 2016-02-10 | DRG: 470 | Disposition: A | Discharge status: Home Health | Payer: Medicare Other | Source: Ambulatory Visit | Attending: Orthopedic Surgery | Admitting: Orthopedic Surgery

## 2016-02-07 ENCOUNTER — Encounter (HOSPITAL_COMMUNITY)
Admission: RE | Disposition: A | Discharge status: Home Health | Payer: Self-pay | Source: Ambulatory Visit | Attending: Orthopedic Surgery

## 2016-02-07 ENCOUNTER — Inpatient Hospital Stay (HOSPITAL_COMMUNITY): Payer: Medicare Other | Admitting: Emergency Medicine

## 2016-02-07 DIAGNOSIS — J309 Allergic rhinitis, unspecified: Secondary | ICD-10-CM | POA: Diagnosis present

## 2016-02-07 DIAGNOSIS — G4733 Obstructive sleep apnea (adult) (pediatric): Secondary | ICD-10-CM | POA: Diagnosis present

## 2016-02-07 DIAGNOSIS — Q211 Atrial septal defect: Secondary | ICD-10-CM | POA: Diagnosis not present

## 2016-02-07 DIAGNOSIS — J4520 Mild intermittent asthma, uncomplicated: Secondary | ICD-10-CM | POA: Diagnosis present

## 2016-02-07 DIAGNOSIS — Q210 Ventricular septal defect: Secondary | ICD-10-CM

## 2016-02-07 DIAGNOSIS — E739 Lactose intolerance, unspecified: Secondary | ICD-10-CM | POA: Diagnosis present

## 2016-02-07 DIAGNOSIS — K589 Irritable bowel syndrome without diarrhea: Secondary | ICD-10-CM | POA: Diagnosis present

## 2016-02-07 DIAGNOSIS — J449 Chronic obstructive pulmonary disease, unspecified: Secondary | ICD-10-CM | POA: Diagnosis present

## 2016-02-07 DIAGNOSIS — Z8041 Family history of malignant neoplasm of ovary: Secondary | ICD-10-CM

## 2016-02-07 DIAGNOSIS — Z96651 Presence of right artificial knee joint: Secondary | ICD-10-CM | POA: Diagnosis present

## 2016-02-07 DIAGNOSIS — H9190 Unspecified hearing loss, unspecified ear: Secondary | ICD-10-CM | POA: Diagnosis present

## 2016-02-07 DIAGNOSIS — K582 Mixed irritable bowel syndrome: Secondary | ICD-10-CM | POA: Diagnosis present

## 2016-02-07 DIAGNOSIS — G2581 Restless legs syndrome: Secondary | ICD-10-CM | POA: Diagnosis present

## 2016-02-07 DIAGNOSIS — G40209 Localization-related (focal) (partial) symptomatic epilepsy and epileptic syndromes with complex partial seizures, not intractable, without status epilepticus: Secondary | ICD-10-CM | POA: Diagnosis present

## 2016-02-07 DIAGNOSIS — D62 Acute posthemorrhagic anemia: Secondary | ICD-10-CM | POA: Diagnosis not present

## 2016-02-07 DIAGNOSIS — Q048 Other specified congenital malformations of brain: Secondary | ICD-10-CM | POA: Diagnosis not present

## 2016-02-07 DIAGNOSIS — E785 Hyperlipidemia, unspecified: Secondary | ICD-10-CM | POA: Diagnosis present

## 2016-02-07 DIAGNOSIS — Z87891 Personal history of nicotine dependence: Secondary | ICD-10-CM

## 2016-02-07 DIAGNOSIS — F410 Panic disorder [episodic paroxysmal anxiety] without agoraphobia: Secondary | ICD-10-CM | POA: Diagnosis present

## 2016-02-07 DIAGNOSIS — M797 Fibromyalgia: Secondary | ICD-10-CM | POA: Diagnosis present

## 2016-02-07 DIAGNOSIS — Z803 Family history of malignant neoplasm of breast: Secondary | ICD-10-CM

## 2016-02-07 DIAGNOSIS — M1712 Unilateral primary osteoarthritis, left knee: Secondary | ICD-10-CM | POA: Diagnosis present

## 2016-02-07 DIAGNOSIS — Z8673 Personal history of transient ischemic attack (TIA), and cerebral infarction without residual deficits: Secondary | ICD-10-CM

## 2016-02-07 DIAGNOSIS — J4540 Moderate persistent asthma, uncomplicated: Secondary | ICD-10-CM | POA: Diagnosis present

## 2016-02-07 DIAGNOSIS — R9431 Abnormal electrocardiogram [ECG] [EKG]: Secondary | ICD-10-CM | POA: Diagnosis present

## 2016-02-07 DIAGNOSIS — K219 Gastro-esophageal reflux disease without esophagitis: Secondary | ICD-10-CM | POA: Diagnosis present

## 2016-02-07 DIAGNOSIS — Q2112 Patent foramen ovale: Secondary | ICD-10-CM

## 2016-02-07 DIAGNOSIS — M069 Rheumatoid arthritis, unspecified: Secondary | ICD-10-CM | POA: Diagnosis present

## 2016-02-07 DIAGNOSIS — I639 Cerebral infarction, unspecified: Secondary | ICD-10-CM | POA: Diagnosis present

## 2016-02-07 HISTORY — DX: Unilateral primary osteoarthritis, left knee: M17.12

## 2016-02-07 HISTORY — DX: Acute posthemorrhagic anemia: D62

## 2016-02-07 HISTORY — PX: TOTAL KNEE ARTHROPLASTY: SHX125

## 2016-02-07 LAB — CBC
HEMATOCRIT: 38.7 % (ref 36.0–46.0)
Hemoglobin: 12.7 g/dL (ref 12.0–15.0)
MCH: 32.5 pg (ref 26.0–34.0)
MCHC: 32.8 g/dL (ref 30.0–36.0)
MCV: 99 fL (ref 78.0–100.0)
Platelets: 119 10*3/uL — ABNORMAL LOW (ref 150–400)
RBC: 3.91 MIL/uL (ref 3.87–5.11)
RDW: 13.9 % (ref 11.5–15.5)
WBC: 5.4 10*3/uL (ref 4.0–10.5)

## 2016-02-07 LAB — CREATININE, SERUM: Creatinine, Ser: 0.55 mg/dL (ref 0.44–1.00)

## 2016-02-07 SURGERY — ARTHROPLASTY, KNEE, TOTAL
Anesthesia: General | Laterality: Left

## 2016-02-07 MED ORDER — LORATADINE 10 MG PO TABS
10.0000 mg | ORAL_TABLET | Freq: Every day | ORAL | Status: DC
  Administered 2016-02-07 – 2016-02-10 (×4): 10 mg via ORAL
  Filled 2016-02-07 (×4): qty 1

## 2016-02-07 MED ORDER — TOPIRAMATE 25 MG PO TABS
50.0000 mg | ORAL_TABLET | Freq: Two times a day (BID) | ORAL | Status: DC
  Administered 2016-02-07 – 2016-02-10 (×6): 50 mg via ORAL
  Filled 2016-02-07 (×6): qty 2

## 2016-02-07 MED ORDER — VANCOMYCIN HCL IN DEXTROSE 1-5 GM/200ML-% IV SOLN
1000.0000 mg | Freq: Two times a day (BID) | INTRAVENOUS | Status: AC
  Administered 2016-02-07: 1000 mg via INTRAVENOUS
  Filled 2016-02-07: qty 200

## 2016-02-07 MED ORDER — DIVALPROEX SODIUM ER 500 MG PO TB24
1000.0000 mg | ORAL_TABLET | Freq: Two times a day (BID) | ORAL | Status: DC
  Administered 2016-02-07 – 2016-02-10 (×6): 1000 mg via ORAL
  Filled 2016-02-07 (×7): qty 2

## 2016-02-07 MED ORDER — DEXAMETHASONE SODIUM PHOSPHATE 10 MG/ML IJ SOLN
INTRAMUSCULAR | Status: AC
  Filled 2016-02-07: qty 1

## 2016-02-07 MED ORDER — HYDROXYZINE HCL 25 MG PO TABS
25.0000 mg | ORAL_TABLET | ORAL | Status: DC | PRN
  Administered 2016-02-07 – 2016-02-09 (×5): 25 mg via ORAL
  Filled 2016-02-07 (×5): qty 1

## 2016-02-07 MED ORDER — LACTATED RINGERS IV SOLN: INTRAVENOUS | Status: DC

## 2016-02-07 MED ORDER — BUPIVACAINE-EPINEPHRINE (PF) 0.25% -1:200000 IJ SOLN
INTRAMUSCULAR | Status: AC
Start: 2016-02-07 — End: 2016-02-07
  Filled 2016-02-07: qty 30

## 2016-02-07 MED ORDER — POLYETHYLENE GLYCOL 3350 17 G PO PACK
17.0000 g | PACK | Freq: Two times a day (BID) | ORAL | Status: DC
  Administered 2016-02-07 – 2016-02-10 (×4): 17 g via ORAL
  Filled 2016-02-07 (×6): qty 1

## 2016-02-07 MED ORDER — ACETAMINOPHEN 325 MG PO TABS: 650.0000 mg | ORAL_TABLET | Freq: Four times a day (QID) | ORAL | Status: DC | PRN

## 2016-02-07 MED ORDER — ONDANSETRON HCL 4 MG/2ML IJ SOLN
INTRAMUSCULAR | Status: DC | PRN
Start: 2016-02-07 — End: 2016-02-07
  Administered 2016-02-07: 4 mg via INTRAVENOUS

## 2016-02-07 MED ORDER — 0.9 % SODIUM CHLORIDE (POUR BTL) OPTIME
TOPICAL | Status: DC | PRN
  Administered 2016-02-07: 1000 mL

## 2016-02-07 MED ORDER — OXYCODONE HCL 5 MG PO TABS
5.0000 mg | ORAL_TABLET | ORAL | Status: DC | PRN
  Administered 2016-02-07 (×2): 10 mg via ORAL
  Administered 2016-02-08 (×2): 5 mg via ORAL
  Administered 2016-02-08 – 2016-02-09 (×2): 10 mg via ORAL
  Administered 2016-02-09 – 2016-02-10 (×3): 5 mg via ORAL
  Administered 2016-02-10: 10 mg via ORAL
  Administered 2016-02-10: 5 mg via ORAL
  Filled 2016-02-07: qty 1
  Filled 2016-02-07: qty 2
  Filled 2016-02-07 (×3): qty 1
  Filled 2016-02-07 (×3): qty 2
  Filled 2016-02-07: qty 1
  Filled 2016-02-07 (×2): qty 2

## 2016-02-07 MED ORDER — ONDANSETRON HCL 4 MG/2ML IJ SOLN: 4.0000 mg | Freq: Four times a day (QID) | INTRAMUSCULAR | Status: DC | PRN

## 2016-02-07 MED ORDER — METOCLOPRAMIDE HCL 5 MG/ML IJ SOLN
10.0000 mg | Freq: Once | INTRAMUSCULAR | Status: AC | PRN
  Administered 2016-02-07: 10 mg via INTRAVENOUS

## 2016-02-07 MED ORDER — POVIDONE-IODINE 7.5 % EX SOLN
Freq: Once | CUTANEOUS | Status: DC
  Filled 2016-02-07: qty 118

## 2016-02-07 MED ORDER — LACTATED RINGERS IV SOLN
INTRAVENOUS | Status: DC | PRN
  Administered 2016-02-07 (×2): via INTRAVENOUS

## 2016-02-07 MED ORDER — DOCUSATE SODIUM 100 MG PO CAPS
100.0000 mg | ORAL_CAPSULE | Freq: Two times a day (BID) | ORAL | Status: DC
  Administered 2016-02-07 – 2016-02-10 (×6): 100 mg via ORAL
  Filled 2016-02-07 (×6): qty 1

## 2016-02-07 MED ORDER — SACCHAROMYCES BOULARDII 250 MG PO CAPS
250.0000 mg | ORAL_CAPSULE | Freq: Two times a day (BID) | ORAL | Status: DC
Start: 2016-02-07 — End: 2016-02-10
  Administered 2016-02-07 – 2016-02-10 (×6): 250 mg via ORAL
  Filled 2016-02-07 (×6): qty 1

## 2016-02-07 MED ORDER — ONDANSETRON HCL 4 MG/2ML IJ SOLN
INTRAMUSCULAR | Status: AC
  Filled 2016-02-07: qty 2

## 2016-02-07 MED ORDER — METOCLOPRAMIDE HCL 5 MG/ML IJ SOLN: 5.0000 mg | Freq: Three times a day (TID) | INTRAMUSCULAR | Status: DC | PRN

## 2016-02-07 MED ORDER — VANCOMYCIN HCL IN DEXTROSE 1-5 GM/200ML-% IV SOLN
1000.0000 mg | INTRAVENOUS | Status: AC
  Administered 2016-02-07: 1000 mg via INTRAVENOUS

## 2016-02-07 MED ORDER — LIDOCAINE HCL (CARDIAC) 20 MG/ML IV SOLN
INTRAVENOUS | Status: DC | PRN
  Administered 2016-02-07: 50 mg via INTRAVENOUS

## 2016-02-07 MED ORDER — BUPIVACAINE-EPINEPHRINE 0.25% -1:200000 IJ SOLN
INTRAMUSCULAR | Status: DC | PRN
  Administered 2016-02-07: 30 mL

## 2016-02-07 MED ORDER — SUCCINYLCHOLINE CHLORIDE 20 MG/ML IJ SOLN
INTRAMUSCULAR | Status: AC
  Filled 2016-02-07: qty 1

## 2016-02-07 MED ORDER — FENTANYL CITRATE (PF) 250 MCG/5ML IJ SOLN
INTRAMUSCULAR | Status: AC
  Filled 2016-02-07: qty 5

## 2016-02-07 MED ORDER — METOCLOPRAMIDE HCL 5 MG PO TABS: 5.0000 mg | ORAL_TABLET | Freq: Three times a day (TID) | ORAL | Status: DC | PRN

## 2016-02-07 MED ORDER — MIDAZOLAM HCL 5 MG/5ML IJ SOLN
INTRAMUSCULAR | Status: DC | PRN
  Administered 2016-02-07: 2 mg via INTRAVENOUS

## 2016-02-07 MED ORDER — FENTANYL CITRATE (PF) 100 MCG/2ML IJ SOLN
25.0000 ug | INTRAMUSCULAR | Status: DC | PRN
  Administered 2016-02-07 (×2): 50 ug via INTRAVENOUS

## 2016-02-07 MED ORDER — PRAVASTATIN SODIUM 20 MG PO TABS
20.0000 mg | ORAL_TABLET | Freq: Every day | ORAL | Status: DC
  Administered 2016-02-08 – 2016-02-10 (×3): 20 mg via ORAL
  Filled 2016-02-07 (×3): qty 1

## 2016-02-07 MED ORDER — OXYCODONE HCL 5 MG/5ML PO SOLN: 5.0000 mg | Freq: Once | ORAL | Status: DC | PRN

## 2016-02-07 MED ORDER — PHENOL 1.4 % MT LIQD: 1.0000 | OROMUCOSAL | Status: DC | PRN

## 2016-02-07 MED ORDER — SODIUM CHLORIDE 0.9 % IR SOLN
Status: DC | PRN
  Administered 2016-02-07 (×3): 1000 mL

## 2016-02-07 MED ORDER — CEFUROXIME SODIUM 1.5 G IJ SOLR
INTRAMUSCULAR | Status: AC
  Filled 2016-02-07: qty 1.5

## 2016-02-07 MED ORDER — ALBUTEROL SULFATE (2.5 MG/3ML) 0.083% IN NEBU: 2.5000 mg | INHALATION_SOLUTION | Freq: Four times a day (QID) | RESPIRATORY_TRACT | Status: DC | PRN

## 2016-02-07 MED ORDER — DEXAMETHASONE SODIUM PHOSPHATE 10 MG/ML IJ SOLN
INTRAMUSCULAR | Status: DC | PRN
  Administered 2016-02-07: 10 mg via INTRAVENOUS

## 2016-02-07 MED ORDER — DIPHENHYDRAMINE HCL 12.5 MG/5ML PO ELIX
12.5000 mg | ORAL_SOLUTION | ORAL | Status: DC | PRN
  Administered 2016-02-07 – 2016-02-08 (×3): 25 mg via ORAL
  Filled 2016-02-07 (×4): qty 10

## 2016-02-07 MED ORDER — PHENYLEPHRINE HCL 10 MG/ML IJ SOLN
10.0000 mg | INTRAVENOUS | Status: DC | PRN
  Administered 2016-02-07: 50 ug/min via INTRAVENOUS

## 2016-02-07 MED ORDER — CITALOPRAM HYDROBROMIDE 40 MG PO TABS
40.0000 mg | ORAL_TABLET | Freq: Every day | ORAL | Status: DC
  Administered 2016-02-08 – 2016-02-10 (×3): 40 mg via ORAL
  Filled 2016-02-07 (×3): qty 1

## 2016-02-07 MED ORDER — OXYCODONE HCL 5 MG PO TABS: 5.0000 mg | ORAL_TABLET | Freq: Once | ORAL | Status: DC | PRN

## 2016-02-07 MED ORDER — MORPHINE SULFATE ER 15 MG PO TBCR
15.0000 mg | EXTENDED_RELEASE_TABLET | Freq: Two times a day (BID) | ORAL | Status: DC
  Administered 2016-02-07 – 2016-02-08 (×4): 15 mg via ORAL
  Filled 2016-02-07 (×4): qty 1

## 2016-02-07 MED ORDER — LACTATED RINGERS IV SOLN
INTRAVENOUS | Status: DC
  Administered 2016-02-07: 08:00:00 via INTRAVENOUS

## 2016-02-07 MED ORDER — MIDAZOLAM HCL 2 MG/2ML IJ SOLN
INTRAMUSCULAR | Status: AC
  Administered 2016-02-07: 2 mg
  Filled 2016-02-07: qty 2

## 2016-02-07 MED ORDER — CHLORHEXIDINE GLUCONATE 4 % EX LIQD: 60.0000 mL | Freq: Once | CUTANEOUS | Status: DC

## 2016-02-07 MED ORDER — METOCLOPRAMIDE HCL 5 MG/ML IJ SOLN
INTRAMUSCULAR | Status: AC
  Filled 2016-02-07: qty 2

## 2016-02-07 MED ORDER — MENTHOL 3 MG MT LOZG
1.0000 | LOZENGE | OROMUCOSAL | Status: DC | PRN
Start: 2016-02-07 — End: 2016-02-10

## 2016-02-07 MED ORDER — DEXAMETHASONE SODIUM PHOSPHATE 10 MG/ML IJ SOLN
10.0000 mg | Freq: Three times a day (TID) | INTRAMUSCULAR | Status: AC
  Administered 2016-02-07 – 2016-02-08 (×4): 10 mg via INTRAVENOUS
  Filled 2016-02-07 (×4): qty 1

## 2016-02-07 MED ORDER — ONDANSETRON HCL 4 MG PO TABS: 4.0000 mg | ORAL_TABLET | Freq: Four times a day (QID) | ORAL | Status: DC | PRN

## 2016-02-07 MED ORDER — HYDROMORPHONE HCL 1 MG/ML IJ SOLN
1.0000 mg | INTRAMUSCULAR | Status: DC | PRN
  Administered 2016-02-07 – 2016-02-09 (×4): 1 mg via INTRAVENOUS
  Filled 2016-02-07 (×3): qty 1

## 2016-02-07 MED ORDER — ACETAMINOPHEN 650 MG RE SUPP: 650.0000 mg | Freq: Four times a day (QID) | RECTAL | Status: DC | PRN

## 2016-02-07 MED ORDER — ENOXAPARIN SODIUM 30 MG/0.3ML ~~LOC~~ SOLN
30.0000 mg | Freq: Two times a day (BID) | SUBCUTANEOUS | Status: DC
  Administered 2016-02-08 – 2016-02-10 (×5): 30 mg via SUBCUTANEOUS
  Filled 2016-02-07 (×5): qty 0.3

## 2016-02-07 MED ORDER — LIDOCAINE HCL (CARDIAC) 20 MG/ML IV SOLN
INTRAVENOUS | Status: AC
  Filled 2016-02-07: qty 5

## 2016-02-07 MED ORDER — FENTANYL CITRATE (PF) 100 MCG/2ML IJ SOLN
INTRAMUSCULAR | Status: AC
  Administered 2016-02-07: 50 ug via INTRAVENOUS
  Filled 2016-02-07: qty 2

## 2016-02-07 MED ORDER — PANTOPRAZOLE SODIUM 40 MG PO TBEC
40.0000 mg | DELAYED_RELEASE_TABLET | Freq: Every day | ORAL | Status: DC
  Administered 2016-02-07 – 2016-02-10 (×4): 40 mg via ORAL
  Filled 2016-02-07 (×4): qty 1

## 2016-02-07 MED ORDER — POTASSIUM CHLORIDE IN NACL 20-0.9 MEQ/L-% IV SOLN
INTRAVENOUS | Status: DC
  Administered 2016-02-07 – 2016-02-08 (×2): via INTRAVENOUS
  Filled 2016-02-07 (×2): qty 1000

## 2016-02-07 MED ORDER — ALPRAZOLAM 0.5 MG PO TABS
0.5000 mg | ORAL_TABLET | Freq: Three times a day (TID) | ORAL | Status: DC | PRN
  Administered 2016-02-09: 0.5 mg via ORAL
  Filled 2016-02-07: qty 1

## 2016-02-07 MED ORDER — PROPOFOL 500 MG/50ML IV EMUL
INTRAVENOUS | Status: DC | PRN
  Administered 2016-02-07: 75 ug/kg/min via INTRAVENOUS

## 2016-02-07 MED ORDER — FLUTICASONE PROPIONATE 50 MCG/ACT NA SUSP: 2.0000 | Freq: Every day | NASAL | Status: DC | PRN

## 2016-02-07 MED ORDER — CLOBETASOL PROPIONATE 0.05 % EX OINT: 1.0000 "application " | TOPICAL_OINTMENT | Freq: Two times a day (BID) | CUTANEOUS | Status: DC | PRN

## 2016-02-07 MED ORDER — FENTANYL CITRATE (PF) 100 MCG/2ML IJ SOLN
INTRAMUSCULAR | Status: AC
  Filled 2016-02-07: qty 2

## 2016-02-07 MED ORDER — ALUM & MAG HYDROXIDE-SIMETH 200-200-20 MG/5ML PO SUSP: 30.0000 mL | ORAL | Status: DC | PRN

## 2016-02-07 MED ORDER — FOLIC ACID 1 MG PO TABS
1.0000 mg | ORAL_TABLET | Freq: Every day | ORAL | Status: DC
  Administered 2016-02-07 – 2016-02-10 (×4): 1 mg via ORAL
  Filled 2016-02-07 (×4): qty 1

## 2016-02-07 MED ORDER — CYCLOSPORINE 0.05 % OP EMUL
4.0000 [drp] | Freq: Two times a day (BID) | OPHTHALMIC | Status: DC
  Administered 2016-02-07 – 2016-02-10 (×6): 4 [drp] via OPHTHALMIC
  Filled 2016-02-07 (×8): qty 1

## 2016-02-07 MED ORDER — FENTANYL CITRATE (PF) 100 MCG/2ML IJ SOLN
INTRAMUSCULAR | Status: DC | PRN
  Administered 2016-02-07: 50 ug via INTRAVENOUS

## 2016-02-07 MED ORDER — MIDAZOLAM HCL 2 MG/2ML IJ SOLN
INTRAMUSCULAR | Status: AC
  Filled 2016-02-07: qty 2

## 2016-02-07 MED ORDER — FENTANYL CITRATE (PF) 100 MCG/2ML IJ SOLN
100.0000 ug | Freq: Once | INTRAMUSCULAR | Status: AC
  Administered 2016-02-07: 100 ug via INTRAVENOUS

## 2016-02-07 MED ORDER — HYDROMORPHONE HCL 1 MG/ML IJ SOLN
INTRAMUSCULAR | Status: AC
  Administered 2016-02-07: 1 mg via INTRAVENOUS
  Filled 2016-02-07: qty 1

## 2016-02-07 MED ORDER — MIDAZOLAM HCL 2 MG/2ML IJ SOLN: 2.0000 mg | Freq: Once | INTRAMUSCULAR | Status: DC

## 2016-02-07 MED ORDER — PROPOFOL 10 MG/ML IV BOLUS
INTRAVENOUS | Status: AC
  Filled 2016-02-07: qty 20

## 2016-02-07 SURGICAL SUPPLY — 80 items
APL SKNCLS STERI-STRIP NONHPOA (GAUZE/BANDAGES/DRESSINGS) ×1
BANDAGE ACE 6X5 VEL STRL LF (GAUZE/BANDAGES/DRESSINGS) ×2 IMPLANT
BANDAGE ESMARK 6X9 LF (GAUZE/BANDAGES/DRESSINGS) ×1 IMPLANT
BENZOIN TINCTURE PRP APPL 2/3 (GAUZE/BANDAGES/DRESSINGS) ×3 IMPLANT
BLADE SAGITTAL 25.0X1.19X90 (BLADE) ×2 IMPLANT
BLADE SAGITTAL 25.0X1.19X90MM (BLADE) ×1
BLADE SAW RECIP 87.9 MT (BLADE) ×1 IMPLANT
BLADE SAW SGTL 13X75X1.27 (BLADE) ×3 IMPLANT
BLADE SURG 10 STRL SS (BLADE) ×6 IMPLANT
BNDG CMPR 9X6 STRL LF SNTH (GAUZE/BANDAGES/DRESSINGS) ×1
BNDG CMPR MED 15X6 ELC VLCR LF (GAUZE/BANDAGES/DRESSINGS) ×1
BNDG ELASTIC 6X15 VLCR STRL LF (GAUZE/BANDAGES/DRESSINGS) ×3 IMPLANT
BNDG ESMARK 6X9 LF (GAUZE/BANDAGES/DRESSINGS) ×3
BNDG GAUZE ELAST 4 BULKY (GAUZE/BANDAGES/DRESSINGS) ×2 IMPLANT
BOWL SMART MIX CTS (DISPOSABLE) ×3 IMPLANT
CAP KNEE TOTAL 3 ×2 IMPLANT
CATH FOLEY LATEX FREE 14FR (CATHETERS) ×3
CATH FOLEY LF 14FR (CATHETERS) IMPLANT
CEMENT HV SMART SET (Cement) ×6 IMPLANT
CLOSURE WOUND 1/2 X4 (GAUZE/BANDAGES/DRESSINGS) ×1
COVER SURGICAL LIGHT HANDLE (MISCELLANEOUS) ×3 IMPLANT
CUFF TOURNIQUET SINGLE 34IN LL (TOURNIQUET CUFF) ×3 IMPLANT
CUFF TOURNIQUET SINGLE 44IN (TOURNIQUET CUFF) IMPLANT
DECANTER SPIKE VIAL GLASS SM (MISCELLANEOUS) ×1 IMPLANT
DRAPE EXTREMITY T 121X128X90 (DRAPE) ×3 IMPLANT
DRAPE INCISE IOBAN 66X45 STRL (DRAPES) ×3 IMPLANT
DRAPE PROXIMA HALF (DRAPES) ×3 IMPLANT
DRAPE U-SHAPE 47X51 STRL (DRAPES) ×3 IMPLANT
DRSG ADAPTIC 3X8 NADH LF (GAUZE/BANDAGES/DRESSINGS) ×2 IMPLANT
DRSG AQUACEL AG ADV 3.5X14 (GAUZE/BANDAGES/DRESSINGS) ×1 IMPLANT
DRSG PAD ABDOMINAL 8X10 ST (GAUZE/BANDAGES/DRESSINGS) ×2 IMPLANT
DURAPREP 26ML APPLICATOR (WOUND CARE) ×6 IMPLANT
ELECT CAUTERY BLADE 6.4 (BLADE) ×3 IMPLANT
ELECT REM PT RETURN 9FT ADLT (ELECTROSURGICAL) ×3
ELECTRODE REM PT RTRN 9FT ADLT (ELECTROSURGICAL) ×1 IMPLANT
FACESHIELD WRAPAROUND (MASK) ×3 IMPLANT
FACESHIELD WRAPAROUND OR TEAM (MASK) ×1 IMPLANT
GLOVE BIO SURGEON STRL SZ7 (GLOVE) ×1 IMPLANT
GLOVE BIOGEL PI IND STRL 7.0 (GLOVE) ×1 IMPLANT
GLOVE BIOGEL PI IND STRL 7.5 (GLOVE) ×1 IMPLANT
GLOVE BIOGEL PI INDICATOR 7.0 (GLOVE) ×2
GLOVE BIOGEL PI INDICATOR 7.5 (GLOVE) ×2
GLOVE SS BIOGEL STRL SZ 7.5 (GLOVE) ×1 IMPLANT
GLOVE SUPERSENSE BIOGEL SZ 7.5 (GLOVE)
GOWN STRL REUS W/ TWL LRG LVL3 (GOWN DISPOSABLE) ×1 IMPLANT
GOWN STRL REUS W/ TWL XL LVL3 (GOWN DISPOSABLE) ×2 IMPLANT
GOWN STRL REUS W/TWL LRG LVL3 (GOWN DISPOSABLE) ×3
GOWN STRL REUS W/TWL XL LVL3 (GOWN DISPOSABLE) ×6
HANDPIECE INTERPULSE COAX TIP (DISPOSABLE) ×3
HOOD PEEL AWAY FACE SHEILD DIS (HOOD) ×8 IMPLANT
IMMOBILIZER KNEE 22 UNIV (SOFTGOODS) ×3 IMPLANT
KIT BASIN OR (CUSTOM PROCEDURE TRAY) ×3 IMPLANT
KIT ROOM TURNOVER OR (KITS) ×3 IMPLANT
MANIFOLD NEPTUNE II (INSTRUMENTS) ×3 IMPLANT
MARKER SKIN DUAL TIP RULER LAB (MISCELLANEOUS) ×3 IMPLANT
NDL 18GX1X1/2 (RX/OR ONLY) (NEEDLE) ×1 IMPLANT
NEEDLE 18GX1X1/2 (RX/OR ONLY) (NEEDLE) ×3 IMPLANT
NS IRRIG 1000ML POUR BTL (IV SOLUTION) ×3 IMPLANT
PACK TOTAL JOINT (CUSTOM PROCEDURE TRAY) ×3 IMPLANT
PAD ARMBOARD 7.5X6 YLW CONV (MISCELLANEOUS) ×6 IMPLANT
PADDING CAST COTTON 6X4 STRL (CAST SUPPLIES) ×2 IMPLANT
SET HNDPC FAN SPRY TIP SCT (DISPOSABLE) ×1 IMPLANT
SPONGE GAUZE 4X4 12PLY STER LF (GAUZE/BANDAGES/DRESSINGS) ×2 IMPLANT
STRIP CLOSURE SKIN 1/2X4 (GAUZE/BANDAGES/DRESSINGS) ×2 IMPLANT
SUCTION FRAZIER HANDLE 10FR (MISCELLANEOUS) ×2
SUCTION TUBE FRAZIER 10FR DISP (MISCELLANEOUS) ×1 IMPLANT
SUT MNCRL AB 3-0 PS2 18 (SUTURE) ×3 IMPLANT
SUT VIC AB 0 CT1 27 (SUTURE) ×6
SUT VIC AB 0 CT1 27XBRD ANBCTR (SUTURE) ×2 IMPLANT
SUT VIC AB 1 CT1 27 (SUTURE) ×6
SUT VIC AB 1 CT1 27XBRD ANBCTR (SUTURE) ×1 IMPLANT
SUT VIC AB 2-0 CT1 27 (SUTURE) ×6
SUT VIC AB 2-0 CT1 TAPERPNT 27 (SUTURE) ×2 IMPLANT
SYR 30ML LL (SYRINGE) ×3 IMPLANT
TOWEL OR 17X24 6PK STRL BLUE (TOWEL DISPOSABLE) ×3 IMPLANT
TOWEL OR 17X26 10 PK STRL BLUE (TOWEL DISPOSABLE) ×3 IMPLANT
TRAY FOLEY CATH 16FR SILVER (SET/KITS/TRAYS/PACK) ×1 IMPLANT
TUBE CONNECTING 12'X1/4 (SUCTIONS) ×1
TUBE CONNECTING 12X1/4 (SUCTIONS) ×2 IMPLANT
YANKAUER SUCT BULB TIP NO VENT (SUCTIONS) ×3 IMPLANT

## 2016-02-07 NOTE — Progress Notes (Signed)
Pt will place self on/off cpap.   Rt will monitor

## 2016-02-07 NOTE — Anesthesia Procedure Notes (Addendum)
Spinal Patient location during procedure: OR Start time: 02/07/2016 9:10 AM End time: 02/07/2016 9:15 AM Staffing Performed by: anesthesiologist  Preanesthetic Checklist Completed: patient identified, site marked, surgical consent, pre-op evaluation, timeout performed, IV checked, risks and benefits discussed and monitors and equipment checked Spinal Block Patient position: sitting Prep: ChloraPrep Patient monitoring: heart rate, cardiac monitor, continuous pulse ox and blood pressure Approach: right paramedian Location: L3-4 Injection technique: single-shot Needle Needle type: Tuohy  Needle gauge: 22 G Needle length: 9 cm Assessment Sensory level: T6 Additional Notes 10 mg 0.75% bupivacaine injected easily  Anesthesia Regional Block:  Adductor canal block  Pre-Anesthetic Checklist: ,, timeout performed, Correct Patient, Correct Site, Correct Laterality, Correct Procedure, Correct Position, site marked, Risks and benefits discussed,  Surgical consent,  Pre-op evaluation,  At surgeon's request and post-op pain management  Laterality: Left  Prep: chloraprep       Needles:  Injection technique: Single-shot  Needle Type: Echogenic Stimulator Needle     Needle Length: 10cm 10 cm Needle Gauge: 21 and 21 G    Additional Needles:  Procedures: ultrasound guided (picture in chart) Adductor canal block Narrative:  Start time: 02/07/2016 8:45 AM End time: 02/07/2016 8:50 AM Injection made incrementally with aspirations every 5 mL.  Performed by: Personally   Additional Notes: 30 cc 0.5% Bupivacaine with 1:200 epi injected easily

## 2016-02-07 NOTE — Interval H&P Note (Signed)
History and Physical Interval Note:  02/07/2016 6:23 AM  [name]  has presented today for surgery, with the diagnosis of primary localized OA left knee  The various methods of treatment have been discussed with the patient and family. After consideration of risks, benefits and other options for treatment, the patient has consented to  Procedure(s): TOTAL KNEE ARTHROPLASTY (Left) as a surgical intervention .  The patient's history has been reviewed, patient examined, no change in status, stable for surgery.  I have reviewed the patient's chart and labs.  Questions were answered to the patient's satisfaction.     Salvatore Marvel A

## 2016-02-07 NOTE — Progress Notes (Signed)
Orthopedic Tech Progress Note Patient Details:  [name] 1961/05/04 [id number]  CPM Left Knee CPM Left Knee: On Left Knee Flexion (Degrees): 90 Left Knee Extension (Degrees): 0 Additional Comments: trapeze bar patient helper Viewed order from doctor's order list  Nikki Dom 02/07/2016, 12:29 PM

## 2016-02-07 NOTE — Progress Notes (Signed)
Utilization review completed.  

## 2016-02-07 NOTE — Progress Notes (Signed)
Orthopedic Tech Progress Note Patient Details:  [name] 1961/09/16 [id number] Ortho visit put on cpm at 1910 Patient ID: [name], female   DOB: 1961-05-07, 55 y.o.   MRN: [id number]   [name] 02/07/2016, 7:14 PM

## 2016-02-07 NOTE — Transfer of Care (Signed)
Immediate Anesthesia Transfer of Care Note  Patient: [name]  Procedure(s) Performed: Procedure(s): TOTAL KNEE ARTHROPLASTY (Left)  Patient Location: PACU  Anesthesia Type:Spinal  Level of Consciousness: awake  Airway & Oxygen Therapy: Patient Spontanous Breathing  Post-op Assessment: Report given to RN and Post -op Vital signs reviewed and stable  Post vital signs: Reviewed and stable  Last Vitals:  Filed Vitals:   02/07/16 0858 02/07/16 0903  BP: 102/65   Pulse: 68 70  Temp:    Resp: 14 15    Complications: No apparent anesthesia complications

## 2016-02-07 NOTE — Anesthesia Preprocedure Evaluation (Signed)
Anesthesia Evaluation  Patient identified by MRN, date of birth, ID band Patient awake    Reviewed: Allergy & Precautions, NPO status , Patient's Chart, lab work & pertinent test results  Airway Mallampati: II  TM Distance: >3 FB Neck ROM: Full    Dental  (+) Teeth Intact, Dental Advisory Given   Pulmonary former smoker,    breath sounds clear to auscultation       Cardiovascular  Rhythm:Regular Rate:Normal     Neuro/Psych    GI/Hepatic   Endo/Other    Renal/GU      Musculoskeletal   Abdominal   Peds  Hematology   Anesthesia Other Findings   Reproductive/Obstetrics                             Anesthesia Physical Anesthesia Plan  ASA: II  Anesthesia Plan: MAC and Spinal   Post-op Pain Management:    Induction: Intravenous  Airway Management Planned: Natural Airway and Simple Face Mask  Additional Equipment:   Intra-op Plan:   Post-operative Plan: Extubation in OR  Informed Consent: I have reviewed the patients History and Physical, chart, labs and discussed the procedure including the risks, benefits and alternatives for the proposed anesthesia with the patient or authorized representative who has indicated his/her understanding and acceptance.   Dental advisory given  Plan Discussed with: CRNA and Anesthesiologist  Anesthesia Plan Comments:         Anesthesia Quick Evaluation

## 2016-02-07 NOTE — Evaluation (Signed)
Physical Therapy Evaluation Patient Details Name: [name] MRN: [id number] DOB: 1961/07/27 Today's Date: 02/07/2016   History of Present Illness  Pt admitted 4/24 for elective L TKA.  Clinical Impression  Pt is s/p TKA resulting in the deficits listed below (see PT Problem List). Pt whoozy and sleepy from recently receiving pain medication however was able to get up and transfer to chair. Pt will benefit from skilled PT to increase their independence and safety with mobility to allow discharge to the venue listed below.      Follow Up Recommendations Home health PT;Supervision/Assistance - 24 hour    Equipment Recommendations  None recommended by PT    Recommendations for Other Services       Precautions / Restrictions Precautions Precautions: Knee Precaution Booklet Issued: Yes (comment) Precaution Comments: pt with verbal understanding however very sleepy Required Braces or Orthoses: Knee Immobilizer - Left Knee Immobilizer - Left: On when out of bed or walking Restrictions Weight Bearing Restrictions: Yes LLE Weight Bearing: Weight bearing as tolerated      Mobility  Bed Mobility Overal bed mobility: Needs Assistance Bed Mobility: Supine to Sit     Supine to sit: Min assist     General bed mobility comments: pt used UEs to assist LEs off EOB, HOB elevated, used bed rails  Transfers Overall transfer level: Needs assistance Equipment used: Rolling walker (2 wheeled) Transfers: Sit to/from [employer] Sit to Stand: Min assist Stand pivot transfers: Min assist       General transfer comment: pt mildly dizziness most likely from recently receiving pain meds, v/c's for sequencing but able to place wegiht through L LE in KI  Ambulation/Gait                Stairs            Wheelchair Mobility    Modified Rankin (Stroke Patients Only)       Balance Overall balance assessment: Needs assistance (recent knee surgery,  requires RW for safe amb)                                           Pertinent Vitals/Pain Pain Assessment: 0-10 Pain Score: 4  Pain Location: L knee Pain Descriptors / Indicators: Operative site guarding Pain Intervention(s): Premedicated before session    Home Living Family/patient expects to be discharged to:: Private residence Living Arrangements: Spouse/significant other Available Help at Discharge: Family;Available 24 hours/day Type of Home: House Home Access: Stairs to enter Entrance Stairs-Rails: None Entrance Stairs-Number of Steps: 3 Home Layout: One level Home Equipment: Walker - 2 wheels;Cane - single point;Shower seat;Bedside commode      Prior Function Level of Independence: Needs assistance   Gait / Transfers Assistance Needed: occasionally uses a cane  ADL's / Homemaking Assistance Needed: inep        Hand Dominance   Dominant Hand: Right    Extremity/Trunk Assessment   Upper Extremity Assessment: Overall WFL for tasks assessed           Lower Extremity Assessment: LLE deficits/detail   LLE Deficits / Details: able to initiate quad set and active L knee flexion  Cervical / Trunk Assessment: Normal  Communication   Communication: No difficulties  Cognition Arousal/Alertness: Lethargic;Suspect due to medications (RN reports just giving her "a ton of pain medicine") Behavior During Therapy: WFL for tasks assessed/performed Overall Cognitive Status:  Within Functional Limits for tasks assessed                      General Comments      Exercises Total Joint Exercises Ankle Circles/Pumps: AROM;Left;10 reps;Supine Quad Sets: AROM;Left;10 reps;Supine Heel Slides: AROM;Left;10 reps;Supine      Assessment/Plan    PT Assessment Patient needs continued PT services  PT Diagnosis Difficulty walking;Acute pain   PT Problem List Decreased strength;Decreased range of motion;Decreased activity tolerance;Decreased  balance;Decreased mobility;Decreased knowledge of use of DME  PT Treatment Interventions DME instruction;Gait training;Stair training;Functional mobility training;Therapeutic activities;Therapeutic exercise;Balance training   PT Goals (Current goals can be found in the Care Plan section) Acute Rehab PT Goals Patient Stated Goal: home PT Goal Formulation: With patient Time For Goal Achievement: 02/11/16 Potential to Achieve Goals: Fair    Frequency 7X/week   Barriers to discharge        Co-evaluation               End of Session Equipment Utilized During Treatment: Gait belt Activity Tolerance: Patient tolerated treatment well Patient left: in chair;with call bell/phone within reach;with family/visitor present Nurse Communication: Mobility status         Time: 1610-9604 PT Time Calculation (min) (ACUTE ONLY): 21 min   Charges:   PT Evaluation $PT Eval Moderate Complexity: 1 Procedure     PT G CodesMarcene Brawn 02/07/2016, 3:22 PM   Lewis Shock, PT, DPT Pager #: [phone] Office #: [phone]

## 2016-02-07 NOTE — Progress Notes (Signed)
Patient arrived to room from PACU. Safety precautions and orders reviewed. ICS encourage. CPMopn at this time. Will continue to monitor.   Sim Boast, RN

## 2016-02-07 NOTE — Anesthesia Postprocedure Evaluation (Signed)
Anesthesia Post Note  Patient: [name]  Procedure(s) Performed: Procedure(s) (LRB): TOTAL KNEE ARTHROPLASTY (Left)  Patient location during evaluation: PACU Anesthesia Type: MAC and Spinal Level of consciousness: awake, awake and alert and oriented Pain management: pain level controlled Vital Signs Assessment: post-procedure vital signs reviewed and stable Respiratory status: spontaneous breathing, nonlabored ventilation and respiratory function stable Cardiovascular status: blood pressure returned to baseline Postop Assessment: spinal receding Anesthetic complications: no    Last Vitals:  Filed Vitals:   02/07/16 1245 02/07/16 1400  BP: 103/72 108/63  Pulse: 67 68  Temp: 36.4 C   Resp: 15 16    Last Pain:  Filed Vitals:   02/07/16 1450  PainSc: 7                  Johnanna Bakke COKER

## 2016-02-08 ENCOUNTER — Encounter (HOSPITAL_COMMUNITY): Payer: Self-pay | Admitting: Physician Assistant

## 2016-02-08 DIAGNOSIS — D62 Acute posthemorrhagic anemia: Secondary | ICD-10-CM

## 2016-02-08 HISTORY — DX: Acute posthemorrhagic anemia: D62

## 2016-02-08 LAB — BASIC METABOLIC PANEL
ANION GAP: 10 (ref 5–15)
BUN: <5 mg/dL — ABNORMAL LOW (ref 6–20)
CHLORIDE: 110 mmol/L (ref 101–111)
CO2: 19 mmol/L — AB (ref 22–32)
Calcium: 8.5 mg/dL — ABNORMAL LOW (ref 8.9–10.3)
Creatinine, Ser: 0.57 mg/dL (ref 0.44–1.00)
GFR calc non Af Amer: >60 mL/min (ref >60)
GLUCOSE: 136 mg/dL — AB (ref 65–99)
Potassium: 4.4 mmol/L (ref 3.5–5.1)
Sodium: 139 mmol/L (ref 135–145)

## 2016-02-08 LAB — CBC
HEMATOCRIT: 28 % — AB (ref 36.0–46.0)
HEMOGLOBIN: 7.9 g/dL — AB (ref 12.0–15.0)
MCH: 28 pg (ref 26.0–34.0)
MCHC: 28.2 g/dL — ABNORMAL LOW (ref 30.0–36.0)
MCV: 99.3 fL (ref 78.0–100.0)
Platelets: 99 10*3/uL — ABNORMAL LOW (ref 150–400)
RBC: 2.82 MIL/uL — ABNORMAL LOW (ref 3.87–5.11)
RDW: 14 % (ref 11.5–15.5)
WBC: 10.1 10*3/uL (ref 4.0–10.5)

## 2016-02-08 LAB — PREPARE RBC (CROSSMATCH)

## 2016-02-08 MED ORDER — SODIUM CHLORIDE 0.9 % IV SOLN
Freq: Once | INTRAVENOUS | Status: AC
  Administered 2016-02-08: 12:00:00 via INTRAVENOUS

## 2016-02-08 NOTE — Progress Notes (Signed)
Placed patient on CPAP set at 10cm for the night. Patient used her home mask.

## 2016-02-08 NOTE — Progress Notes (Signed)
Physical Therapy Treatment Patient Details Name: [name] MRN: [id number] DOB: 03-Feb-1961 Today's Date: 02/08/2016    History of Present Illness Pt admitted 4/24 for elective L TKA.    PT Comments    C/o dizziness remain after 1 unit of blood.  Pt to receive second unit.  Pt will require chair follow for safety.  Will plan on stair training and complete review of HEP.    Follow Up Recommendations  Home health PT;Supervision/Assistance - 24 hour     Equipment Recommendations  None recommended by PT    Recommendations for Other Services       Precautions / Restrictions Precautions Precautions: Knee Precaution Booklet Issued: Yes (comment) Precaution Comments: pt with verbal understanding however very sleepy Required Braces or Orthoses: Knee Immobilizer - Left Knee Immobilizer - Left: On when out of bed or walking Restrictions Weight Bearing Restrictions: Yes LLE Weight Bearing: Weight bearing as tolerated    Mobility  Bed Mobility Overal bed mobility: Needs Assistance Bed Mobility: Supine to Sit     Supine to sit: Min guard     General bed mobility comments: pt used UEs to assist LEs off EOB, HOB elevated, used bed rails  Transfers Overall transfer level: Needs assistance Equipment used: Rolling walker (2 wheeled);None Transfers: Sit to/from Stand Sit to Stand: Min guard Stand pivot transfers: Min guard       General transfer comment: Cues for hand placement pt impulsive to stand without RW to transfer from bed to commode.    Ambulation/Gait Ambulation/Gait assistance: Min assist Ambulation Distance (Feet): 160 Feet Assistive device: Rolling walker (2 wheeled) Gait Pattern/deviations: Step-through pattern;Antalgic;Decreased stride length   Gait velocity interpretation: Below normal speed for age/gender General Gait Details: Cues for upper trunk control, forward gaze, L heel strike and L knee extension in stance phase.  Pt remains to c/o dizziness  requiring termination of tx immediately but able to advance gait distance before symptoms appeared.  Required chair follow for safety.     Stairs            Wheelchair Mobility    Modified Rankin (Stroke Patients Only)       Balance                                    Cognition Arousal/Alertness: Awake/alert Behavior During Therapy: WFL for tasks assessed/performed Overall Cognitive Status: Within Functional Limits for tasks assessed                      Exercises Total Joint Exercises Ankle Circles/Pumps: AROM;Left;10 reps;Supine Quad Sets: AROM;Left;10 reps;Supine Gluteal Sets: AROM;Left;10 reps;Supine Heel Slides: Left;10 reps;Supine;AAROM Hip ABduction/ADduction: AAROM;Left;10 reps;Supine Straight Leg Raises: AAROM;Left;10 reps;Supine    General Comments        Pertinent Vitals/Pain Pain Assessment: 0-10 Pain Score: 5  Pain Location: L knee Pain Descriptors / Indicators: Guarding;Throbbing;Tender;Tightness Pain Intervention(s): Monitored during session;Ice applied;Repositioned    Home Living                      Prior Function            PT Goals (current goals can now be found in the care plan section) Acute Rehab PT Goals Patient Stated Goal: home Potential to Achieve Goals: Fair Progress towards PT goals: Progressing toward goals    Frequency  7X/week    PT Plan  Co-evaluation             End of Session Equipment Utilized During Treatment: Gait belt Activity Tolerance: Patient tolerated treatment well Patient left: in chair;with call bell/phone within reach;with family/visitor present     Time: 1031-5945 PT Time Calculation (min) (ACUTE ONLY): 29 min  Charges:  $Gait Training: 8-22 mins $Therapeutic Exercise: 8-22 mins                    G Codes:      Florestine Avers Mar 07, 2016, 4:57 PM  Joycelyn Rua, PTA pager [phone]

## 2016-02-08 NOTE — Progress Notes (Signed)
Physical Therapy Treatment Patient Details Name: [name] MRN: [id number] DOB: Apr 01, 1961 Today's Date: 02/08/2016    History of Present Illness Pt admitted 4/24 for elective L TKA.    PT Comments    Pt to receive 2 units of blood to improve HGB.  Pt c/o dizziness during gait and requiring recliner to sit.  Pt vitals 127/76 and HR 91 bpm.  Pt reports improvement in reclined position.  RN aware.    Follow Up Recommendations  Home health PT;Supervision/Assistance - 24 hour     Equipment Recommendations  None recommended by PT    Recommendations for Other Services       Precautions / Restrictions Precautions Precautions: Knee Precaution Booklet Issued: Yes (comment) Precaution Comments: pt with verbal understanding however very sleepy Required Braces or Orthoses: Knee Immobilizer - Left Knee Immobilizer - Left: On when out of bed or walking Restrictions Weight Bearing Restrictions: Yes LLE Weight Bearing: Weight bearing as tolerated    Mobility  Bed Mobility Overal bed mobility: Needs Assistance Bed Mobility: Supine to Sit     Supine to sit: Min assist     General bed mobility comments: pt used UEs to assist LEs off EOB, HOB elevated, used bed rails  Transfers Overall transfer level: Needs assistance Equipment used: Rolling walker (2 wheeled) Transfers: Sit to/from Stand Sit to Stand: Min guard Stand pivot transfers: Min guard       General transfer comment: Pt c/o dizziness post gait training required return to seated position.  Pt required cues for hand placement and LLE foot placement.  Ambulation/Gait Ambulation/Gait assistance: Min guard;Min assist Ambulation Distance (Feet): 52 Feet Assistive device: Rolling walker (2 wheeled) Gait Pattern/deviations: Step-to pattern;Step-through pattern;Antalgic;Decreased stride length   Gait velocity interpretation: Below normal speed for age/gender General Gait Details: Cues for upper trunk control, forward  gaze, L heel strike and L knee extension in stance phase.  Pt c/o dizziness requiring termination of tx immediately.  Required chair to be brought to patient.     Stairs            Wheelchair Mobility    Modified Rankin (Stroke Patients Only)       Balance                                    Cognition Arousal/Alertness: Awake/alert Behavior During Therapy: WFL for tasks assessed/performed Overall Cognitive Status: Within Functional Limits for tasks assessed                      Exercises Total Joint Exercises Ankle Circles/Pumps: AROM;Left;10 reps;Supine Quad Sets: AROM;Left;10 reps;Supine Gluteal Sets: AROM;Left;10 reps;Supine Heel Slides: Left;10 reps;Supine;AAROM Hip ABduction/ADduction: AAROM;Left;10 reps;Supine Straight Leg Raises: AAROM;Left;10 reps;Supine    General Comments        Pertinent Vitals/Pain Pain Assessment: 0-10 Pain Score: 4  Pain Location: L knee Pain Descriptors / Indicators: Guarding;Tightness Pain Intervention(s): Monitored during session;Repositioned    Home Living                      Prior Function            PT Goals (current goals can now be found in the care plan section) Acute Rehab PT Goals Patient Stated Goal: home Potential to Achieve Goals: Fair Progress towards PT goals: Progressing toward goals    Frequency  7X/week    PT Plan  Co-evaluation             End of Session Equipment Utilized During Treatment: Gait belt Activity Tolerance: Patient tolerated treatment well Patient left: in chair;with call bell/phone within reach;with family/visitor present     Time: 3532-9924 PT Time Calculation (min) (ACUTE ONLY): 25 min  Charges:  $Gait Training: 8-22 mins $Therapeutic Exercise: 8-22 mins                    G Codes:      Florestine Avers 2016-02-12, 4:51 PM  Joycelyn Rua, PTA pager [phone]

## 2016-02-08 NOTE — Op Note (Signed)
[name], [name]               ACCOUNT NO.:  [id number]  MEDICAL RECORD NO.:  [id number]  LOCATION:  [id number]                        FACILITY:  [facility]  PHYSICIAN:  Roshell Brigham A. Thurston [name], M.D. DATE OF BIRTH:  1961/04/08  DATE OF PROCEDURE:  02/07/2016 DATE OF DISCHARGE:                              OPERATIVE REPORT   PREOPERATIVE DIAGNOSIS:  Left knee primary localized osteoarthritis.  POSTOPERATIVE DIAGNOSIS:  Left knee primary localized osteoarthritis.  PROCEDURE:  Left total knee replacement using [employer] Oxinium total knee system with #6 cemented femur, #5 cemented tibia with 9 mm polyethylene tibial spacer with 32 mm polyethylene cemented patella.  SURGEON:  Elana Alm. Thurston [name], M.D.  ASSISTANT:  Julien Girt, PA.  ANESTHESIA:  Spinal.  OPERATIVE TIME:  80 minutes.  COMPLICATIONS:  None.  DESCRIPTION OF PROCEDURE:  [name] was brought to the operating room on February 07, 2016, placed on operative table in supine position.  After undergoing spinal anesthesia by anesthesiologist without complications, she had a Foley catheter placed under sterile conditions.  Her left knee was examined.  Range of motion from -5 to 125 degrees, moderate valgus deformity.  Knee stable ligamentous exam with normal patellar tracking. She received antibiotics preoperatively for prophylaxis.  Left leg was prepped, used sterile DuraPrep, and draped using sterile technique. Time-out procedure was called and the correct left knee identified. Left leg was exsanguinated and tourniquet elevated 350 mm.  Initially through an 8 cm longitudinal incision based over the patella, initial exposure was made.  The underlying subcutaneous tissues were incised along with skin incision.  Median arthrotomy was performed revealing an excessive amount of normal-appearing joint fluid.  The articular surfaces were inspected.  She had grade 4 changes laterally, grade 3 changes medially and grade 3 and 4  changes in the patellofemoral joint. The medial and lateral meniscal remnants were removed as well as the anterior cruciate ligament.  An intramedullary drill was then drilled up to the femoral canal for placement of the distal femoral cutting jig, which was placed using the appropriate amount of rotation and a distal 9 mm cut was made.  Proximal tibia was then exposed.  The proximal tibial cutting jig was then placed with appropriate amount of rotation and a proximal tibial cut was made referencing of the lateral or lower side, resecting 4 mm off the lateral or lower side and 8 mm off the medial side for balancing.  After this was done, spacer blocks were placed in extension and a 9 mm block gave excellent balancing.  At this point, the distal femur was sized and a #6 was found to be the appropriate size. Then, the #6 cutting jig was placed in the appropriate amount of rotation and then anterior-posterior and chamfer cuts were then made using the #6 cutting block.  After this was done, spacer blocks placed in flexion and extension, 9 mm block gave excellent balancing.  At this point, the proximal tibia was exposed and a #5 tibial base plate trial was placed with excellent fit and the keel cut was made.  The PCL box cutter was then placed on the distal femur and these cuts were made.  At this point,  the trial components were then placed, the knee reduced, taken through a full range of motion, found to be stable with excellent correction of her flexion and valgus deformities and normal patellar tracking.  A resurfacing 9 mm cut was then made on the patella and 3 locking holes were placed for the 32 mm patellar button trial, which was placed, gave an excellent fit and again patellofemoral tracking was evaluated and found to be normal.  At this point, I felt that all the trial components were of excellent size, fit, and stability.  They were then removed.  The knee was then jet lavaged,  irrigated with 3 L of saline and then the proximal tibia was exposed and a #5 tibial base plate with cement backing was hammered in position with an excellent fit with excess cement being removed from around the edges.  #6 femoral component with cement backing was hammered into position also with an excellent fit with excess cement being removed from around the edges. The 10 mm polyethylene spacer was then locked on tibial baseplate.  The knee reduced, taken through a full range of motion, found to be stable with excellent correction of her flexion and valgus deformities.  The 32 mm polyethylene cement backed patella was then placed in its position and held there with a clamp.  After the cement hardened, again patellofemoral tracking was evaluated and found to be normal.  At this point, it was felt that all components were of excellent size, fit, and stability.  The wound was further irrigated with saline and then the arthrotomy was closed with 0 and #1 running Vicryl suture.  Subcutaneous tissues were closed with 2-0 Vicryl, subcuticular layer closed with 4-0 Monocryl, sterile dressings were applied and a long-leg splint.  The patient then awakened after the tourniquet was released and taken to recovery room in stable condition.  Needle and sponge counts correct x2 at the end of the case.     [name], M.D.     RAW/MEDQ  D:  02/08/2016  T:  02/08/2016  Job:  [id number]

## 2016-02-08 NOTE — Progress Notes (Signed)
Subjective: 1 Day Post-Op Procedure(s) (LRB): TOTAL KNEE ARTHROPLASTY (Left) Patient reports pain as 2 on 0-10 scale and 8 on 0-10 scale.    Objective: Vital signs in last 24 hours: Temp:  [97.5 F (36.4 C)-98.4 F (36.9 C)] 98.4 F (36.9 C) (04/25 0449) Pulse Rate:  [58-78] 71 (04/25 0449) Resp:  [13-22] 18 (04/25 0449) BP: (87-110)/(51-82) 106/66 mmHg (04/25 0449) SpO2:  [94 %-100 %] 94 % (04/25 0449)  Intake/Output from previous day: 04/24 0701 - 04/25 0700 In: 2948.3 [P.O.:360; I.V.:2388.3; IV Piggyback:200] Out: 1750 [Urine:1700; Blood:50] Intake/Output this shift:     Recent Labs  02/07/16 1339 02/08/16 0403  HGB 12.7 7.9*    Recent Labs  02/07/16 1339 02/08/16 0403  WBC 5.4 10.1  RBC 3.91 2.82*  HCT 38.7 28.0*  PLT 119* 99*    Recent Labs  02/07/16 1339 02/08/16 0403  NA  --  139  K  --  4.4  CL  --  110  CO2  --  19*  BUN  --  <5*  CREATININE 0.55 0.57  GLUCOSE  --  136*  CALCIUM  --  8.5*   No results for input(s): LABPT, INR in the last 72 hours.  ABD soft Neurovascular intact Sensation intact distally Intact pulses distally Dorsiflexion/Plantar flexion intact Incision: dressing C/D/I  Assessment/Plan: 1 Day Post-Op Procedure(s) (LRB): TOTAL KNEE ARTHROPLASTY (Left)  Principal Problem:   Primary localized osteoarthritis of left knee Active Problems:   Obstructive sleep apnea   RESTLESS LEGS SYNDROME   Moderate intermittent asthma without complication   Irritable bowel syndrome   CVA (cerebral infarction) history   QT prolongation   Patent foramen ovale   Panic anxiety syndrome   Complex partial epilepsy (HCC)   Rheumatoid arthritis (HCC)   Ventricular septal defect   Postoperative anemia due to acute blood loss  Advance diet Up with therapy  Transfuse 2 units of packed red cells with 20 mg lasix IV between units.  Riccardo Holeman J 02/08/2016, 8:46 AM

## 2016-02-08 NOTE — Care Management Note (Signed)
Case Management Note  Patient Details  Name: [name] MRN: [id number] Date of Birth: Sep 24, 1961  Subjective/Objective:         S/p left total knee arthroplasty           Action/Plan: Set up with [facility] for HHPT by MD office. Spoke with patient, no change in discharge plan. [employer] will deliver CPM to home, patient has a rolling walker and 3N1 from previous surery. Patient stated that her husband will be assisting her after discharge.    Expected Discharge Date:                  Expected Discharge Plan:  Home w Home Health Services  In-House Referral:  NA  Discharge planning Services  CM Consult  Post Acute Care Choice:  Durable Medical Equipment, Home Health Choice offered to:  Patient  DME Arranged:  CPM DME Agency:  [employer]  HH Arranged:  PT HH Agency:  [facility]  Status of Service:  Completed, signed off  Medicare Important Message Given:    Date Medicare IM Given:    Medicare IM give by:    Date Additional Medicare IM Given:    Additional Medicare Important Message give by:     If discussed at Long Length of Stay Meetings, dates discussed:    Additional Comments:  Monica Becton, RN 02/08/2016, 2:59 PM

## 2016-02-09 LAB — TYPE AND SCREEN
ABO/RH(D): O NEG
ANTIBODY SCREEN: NEGATIVE
UNIT DIVISION: 0
UNIT DIVISION: 0

## 2016-02-09 LAB — BASIC METABOLIC PANEL
ANION GAP: 13 (ref 5–15)
BUN: 5 mg/dL — AB (ref 6–20)
CALCIUM: 8.8 mg/dL — AB (ref 8.9–10.3)
CO2: 23 mmol/L (ref 22–32)
CREATININE: 0.61 mg/dL (ref 0.44–1.00)
Chloride: 106 mmol/L (ref 101–111)
GFR calc Af Amer: >60 mL/min (ref >60)
GLUCOSE: 114 mg/dL — AB (ref 65–99)
Potassium: 3.6 mmol/L (ref 3.5–5.1)
Sodium: 142 mmol/L (ref 135–145)

## 2016-02-09 LAB — CBC
HEMATOCRIT: 33.6 % — AB (ref 36.0–46.0)
Hemoglobin: 11.4 g/dL — ABNORMAL LOW (ref 12.0–15.0)
MCH: 31.2 pg (ref 26.0–34.0)
MCHC: 33.9 g/dL (ref 30.0–36.0)
MCV: 92.1 fL (ref 78.0–100.0)
PLATELETS: 102 10*3/uL — AB (ref 150–400)
RBC: 3.65 MIL/uL — ABNORMAL LOW (ref 3.87–5.11)
RDW: 16.8 % — AB (ref 11.5–15.5)
WBC: 11 10*3/uL — AB (ref 4.0–10.5)

## 2016-02-09 MED ORDER — ACETAMINOPHEN 650 MG RE SUPP: 650.0000 mg | RECTAL | Status: DC

## 2016-02-09 MED ORDER — ACETAMINOPHEN 325 MG PO TABS
650.0000 mg | ORAL_TABLET | ORAL | Status: DC
  Administered 2016-02-09 – 2016-02-10 (×6): 650 mg via ORAL
  Filled 2016-02-09 (×6): qty 2

## 2016-02-09 MED ORDER — CEPHALEXIN 500 MG PO CAPS
500.0000 mg | ORAL_CAPSULE | Freq: Four times a day (QID) | ORAL | Status: DC
  Administered 2016-02-09 – 2016-02-10 (×6): 500 mg via ORAL
  Filled 2016-02-09 (×7): qty 1

## 2016-02-09 NOTE — Progress Notes (Signed)
Placed patient on CPAP set at 10cm. SP02 at this time is 97%

## 2016-02-09 NOTE — Progress Notes (Signed)
Orthopedic Tech Progress Note Patient Details:  [name] Apr 21, 1961 [id number]  Patient ID: [name], female   DOB: 03/07/1961, 55 y.o.   MRN: [id number] Placed pt's lle on cpm @0 -80 degrees @1500   02/09/2016, 3:02 PM

## 2016-02-09 NOTE — Progress Notes (Signed)
Physical Therapy Treatment Patient Details Name: [name] MRN: [id number] DOB: February 11, 1961 Today's Date: 02/09/2016    History of Present Illness Pt admitted 4/24 for elective L TKA.    PT Comments    Pt presents sleepy and lethargic.  Pt found sitting with HOB 90 degrees in CPM and setting 0-90 degrees.  Bruising noted on L medial and posterior thigh.  Informed PA and nurse of bruising.  Pt tx limited likely to increased pain medicine on board.  Pt sleeping intermittently during tx.  B buckling noted in standing.  Pt not ready for d/c in this state.    Follow Up Recommendations  Home health PT;Supervision/Assistance - 24 hour     Equipment Recommendations  None recommended by PT    Recommendations for Other Services       Precautions / Restrictions Precautions Precautions: Knee Precaution Booklet Issued: Yes (comment) Precaution Comments: pt with verbal understanding however very sleepy Required Braces or Orthoses: Knee Immobilizer - Left Knee Immobilizer - Left: On when out of bed or walking Restrictions Weight Bearing Restrictions: Yes LLE Weight Bearing: Weight bearing as tolerated    Mobility  Bed Mobility Overal bed mobility: Needs Assistance Bed Mobility: Supine to Sit     Supine to sit: Mod assist     General bed mobility comments: Pt required assist to negotiate LLE OOB.  Pt performed with increased assistance and demonstrated poor trunk control initally.    Transfers Overall transfer level: Needs assistance Equipment used: Rolling walker (2 wheeled) Transfers: Sit to/from Stand Sit to Stand: Mod assist Stand pivot transfers: Mod assist       General transfer comment: Cues for hand placement, strong buckling noted in standing returned to seated position and donned KI for safety.  Pt required increased assistance due to lethargic presentation.    Ambulation/Gait Ambulation/Gait assistance: Min assist Ambulation Distance (Feet): 6 Feet (+50 ft,  with increased gait distance on second trial.  ) Assistive device: Rolling walker (2 wheeled) Gait Pattern/deviations: Step-to pattern     General Gait Details: Cues for upper trunk control, forward gaze, L heel strike and L knee extension in stance phase.  Pt remains to c/o dizziness but vitals stable.  Lethragic presentation require chair follow and  increased assistance.     Stairs            Wheelchair Mobility    Modified Rankin (Stroke Patients Only)       Balance Overall balance assessment: Needs assistance   Sitting balance-Leahy Scale: Poor       Standing balance-Leahy Scale: Poor                      Cognition Arousal/Alertness: Lethargic;Suspect due to medications Behavior During Therapy: Flat affect (Lethargic and sleepy.) Overall Cognitive Status: Impaired/Different from baseline Area of Impairment: Following commands;Safety/judgement;Awareness;Problem solving;Memory       Following Commands: Follows one step commands inconsistently Safety/Judgement: Decreased awareness of safety;Decreased awareness of deficits   Problem Solving: Slow processing      Exercises Total Joint Exercises Ankle Circles/Pumps: AROM;Left;10 reps;Supine Quad Sets: AROM;Left;10 reps;Supine Gluteal Sets: AROM;Left;10 reps;Supine Heel Slides: Left;10 reps;Supine;AAROM Hip ABduction/ADduction: AAROM;Left;10 reps;Supine Straight Leg Raises: AAROM;Left;10 reps;Supine    General Comments        Pertinent Vitals/Pain Pain Assessment: Faces Pain Score: 2  (Pt falling asleep during during intervention.  ) Pain Location: L knee Pain Descriptors / Indicators: Grimacing;Guarding Pain Intervention(s): Monitored during session;Repositioned;Ice applied    Home Living  Prior Function            PT Goals (current goals can now be found in the care plan section) Acute Rehab PT Goals Patient Stated Goal: home Potential to Achieve Goals:  Fair Progress towards PT goals: Progressing toward goals    Frequency  7X/week    PT Plan      Co-evaluation             End of Session Equipment Utilized During Treatment: Gait belt Activity Tolerance: Patient tolerated treatment well Patient left: in chair;with call bell/phone within reach;with chair alarm set     Time: 6754-4920 PT Time Calculation (min) (ACUTE ONLY): 41 min  Charges:  $Gait Training: 8-22 mins $Therapeutic Exercise: 8-22 mins                    G Codes:      Florestine Avers 02-17-16, 10:31 AM  Joycelyn Rua, PTA pager [phone]

## 2016-02-09 NOTE — Progress Notes (Signed)
Occupational Therapy Evaluation Patient Details Name: [name] MRN: [id number] DOB: 1960-10-17 Today's Date: 02/09/2016    History of Present Illness Pt admitted 4/24 for elective L TKA.   Clinical Impression   PTA, pt independent with ADL and mobility. Pt currently requires min A with mobility @ RW level with L knee buckling in KI at times and min A with ADL. Pt lethargic on arrival. Increased alertness once EOB but continued to require assistance with safety (began walking with underwear around her ankles). Ambulated @ 100 ft during functional mobility. Will see in am to complete education and facilitate safe D/C home with initial S for mobility and ADL.     Follow Up Recommendations  No OT follow up;Supervision/Assistance - 24 hour (with all mobility and ADL)    Equipment Recommendations  None recommended by OT    Recommendations for Other Services       Precautions / Restrictions Precautions Precautions: Knee Required Braces or Orthoses: Knee Immobilizer - Left Knee Immobilizer - Left: On when out of bed or walking Restrictions LLE Weight Bearing: Weight bearing as tolerated      Mobility Bed Mobility                  Transfers Overall transfer level: Needs assistance Equipment used: Rolling walker (2 wheeled) Transfers: Sit to/from Stand Sit to Stand: Min guard Stand pivot transfers: Min assist       General transfer comment: L knee buckles at times    Balance     Sitting balance-Leahy Scale: Good       Standing balance-Leahy Scale: Fair                              ADL Overall ADL's : Needs assistance/impaired         Upper Body Bathing: Set up;Supervision/ safety;Sitting   Lower Body Bathing: Minimal assistance;Sit to/from stand   Upper Body Dressing : Set up;Supervision/safety;Sitting   Lower Body Dressing: Minimal assistance;Sit to/from stand   Toilet Transfer: Minimal assistance;RW;Ambulation;BSC (over  toilet)   Toileting- Clothing Manipulation and Hygiene: Minimal assistance Toileting - Clothing Manipulation Details (indicate cue type and reason): Began to ambulate with panties around ankles     Functional mobility during ADLs: Minimal assistance;Rolling walker;Cueing for safety;Cueing for sequencing General ADL Comments: Began educating fmaily on compensatory techniques. Pt unable to retain informationi this date due to meds apparently     Vision     Perception     Praxis      Pertinent Vitals/Pain Pain Assessment: Faces Faces Pain Scale: Hurts little more Pain Location: L knee Pain Descriptors / Indicators: Discomfort;Grimacing;Moaning Pain Intervention(s): Limited activity within patient's tolerance;Repositioned;Ice applied     Hand Dominance     Extremity/Trunk Assessment Upper Extremity Assessment Upper Extremity Assessment: Overall WFL for tasks assessed   Lower Extremity Assessment Lower Extremity Assessment: Defer to PT evaluation LLE Deficits / Details:  (L knee continues to buckle when ambulating)   Cervical / Trunk Assessment Cervical / Trunk Assessment: Normal   Communication Communication Communication: Other (comment) (kept repeating self)   Cognition Arousal/Alertness: Lethargic Behavior During Therapy: Flat affect Overall Cognitive Status: Impaired/Different from baseline Area of Impairment: Orientation;Attention;Memory;Following commands;Safety/judgement;Awareness;Problem solving Orientation Level: Disoriented to;Time Current Attention Level: Sustained Memory: Decreased recall of precautions Following Commands: Follows one step commands with increased time Safety/Judgement: Decreased awareness of safety;Decreased awareness of deficits   Problem Solving: Slow processing  General Comments       Exercises       Shoulder Instructions      Home Living Family/patient expects to be discharged to:: Private residence Living Arrangements:  Spouse/significant other Available Help at Discharge: Family;Available 24 hours/day Type of Home: House Home Access: Stairs to enter [employer] of Steps: 3 Entrance Stairs-Rails: None Home Layout: One level     Bathroom Shower/Tub: Producer, television/film/video: Standard Bathroom Accessibility: Yes How Accessible: Accessible via walker Home Equipment: Walker - 2 wheels;Cane - single point;Shower seat;Bedside commode   Additional Comments: unsure. Pt unable to give accurate information. Kept repeating herself      Prior Functioning/Environment Level of Independence: Needs assistance  Gait / Transfers Assistance Needed: occasionally uses a cane ADL's / Homemaking Assistance Needed: independent        OT Diagnosis: Generalized weakness;Acute pain;Altered mental status   OT Problem List: Decreased strength;Decreased range of motion;Decreased activity tolerance;Impaired balance (sitting and/or standing);Decreased safety awareness;Decreased knowledge of use of DME or AE;Decreased knowledge of precautions;Pain   OT Treatment/Interventions: Self-care/ADL training;DME and/or AE instruction;Therapeutic activities;Patient/family education    OT Goals(Current goals can be found in the care plan section) Acute Rehab OT Goals Patient Stated Goal: home OT Goal Formulation: With patient/family Time For Goal Achievement: 02/16/16 Potential to Achieve Goals: Good ADL Goals Pt Will Perform Lower Body Bathing: with set-up;with supervision;with caregiver independent in assisting;sit to/from stand Pt Will Perform Lower Body Dressing: with set-up;with supervision;with caregiver independent in assisting;sit to/from stand Pt Will Transfer to Toilet: with supervision;ambulating;bedside commode (with caregiver independent in assisting) Pt Will Perform Toileting - Clothing Manipulation and hygiene: with supervision;sit to/from stand Pt Will Perform Tub/Shower Transfer: with min guard  assist;with caregiver independent in assisting;rolling walker;3 in 1;ambulating  OT Frequency: Min 2X/week   Barriers to D/C:            Co-evaluation              End of Session Equipment Utilized During Treatment: Gait belt;Rolling walker CPM Left Knee CPM Left Knee: Off Nurse Communication: Mobility status;Other (comment) (need for chair alarm if family leaves room)  Activity Tolerance: Patient tolerated treatment well Patient left: in chair;with call bell/phone within reach;with family/visitor present   Time: 1640-1705 OT Time Calculation (min): 25 min Charges:  OT General Charges $OT Visit: 1 Procedure OT Evaluation $OT Eval Moderate Complexity: 1 Procedure OT Treatments $Self Care/Home Management : 8-22 mins G-Codes:    Dalaysia Harms,HILLARY Feb 29, 2016, 5:09 PM   [facility], OTR/L  [phone] 2016/02/29

## 2016-02-09 NOTE — Progress Notes (Signed)
Subjective: 2 Days Post-Op Procedure(s) (LRB): TOTAL KNEE ARTHROPLASTY (Left) Patient reports pain as 3 on 0-10 scale.   Patient very sedated but arouses easily  Objective: Vital signs in last 24 hours: Temp:  [97.9 F (36.6 C)-99.1 F (37.3 C)] 99.1 F (37.3 C) (04/26 0336) Pulse Rate:  [79-102] 83 (04/26 0336) Resp:  [18] 18 (04/26 0336) BP: (138-159)/(79-91) 154/85 mmHg (04/26 0336) SpO2:  [95 %-98 %] 96 % (04/26 0336)  Intake/Output from previous day: 04/25 0701 - 04/26 0700 In: 1421 [P.O.:730; Blood:691] Out: 300 [Urine:300] Intake/Output this shift:     Recent Labs  02/07/16 1339 02/08/16 0403 02/09/16 0355  HGB 12.7 7.9* 11.4*    Recent Labs  02/08/16 0403 02/09/16 0355  WBC 10.1 11.0*  RBC 2.82* 3.65*  HCT 28.0* 33.6*  PLT 99* 102*    Recent Labs  02/08/16 0403 02/09/16 0355  NA 139 142  K 4.4 3.6  CL 110 106  CO2 19* 23  BUN <5* 5*  CREATININE 0.57 0.61  GLUCOSE 136* 114*  CALCIUM 8.5* 8.8*   No results for input(s): LABPT, INR in the last 72 hours.  ABD soft Neurovascular intact Sensation intact distally Dorsiflexion/Plantar flexion intact Incision: scant drainage  Assessment/Plan: 2 Days Post-Op Procedure(s) (LRB): TOTAL KNEE ARTHROPLASTY (Left)  Principal Problem:   Primary localized osteoarthritis of left knee Active Problems:   Obstructive sleep apnea   RESTLESS LEGS SYNDROME   Moderate intermittent asthma without complication   Irritable bowel syndrome   CVA (cerebral infarction) history   QT prolongation   Patent foramen ovale   Panic anxiety syndrome   Complex partial epilepsy (HCC)   Rheumatoid arthritis (HCC)   Ventricular septal defect   Postoperative anemia due to acute blood loss  Advance diet Up with therapy Plan for discharge tomorrow  Will start Keflex for the redness on inner thigh.  Will keep one more day due to knee buckling with ambulation.  Will D/C MScontin and start around the clock tylenol with  oxycodone for break through pain.  Rilan Eiland J 02/09/2016, 1:06 PM

## 2016-02-09 NOTE — Progress Notes (Addendum)
   02/09/16 1600  PT Visit Information  Last PT Received On 02/09/16  Assistance Needed +2  Reason Eval/Treat Not Completed Pain limiting ability to participate;Fatigue/lethargy limiting ability to participate (Pt remains lethargic refusing tx this pm.  )  History of Present Illness Pt admitted 4/24 for elective L TKA.  Joycelyn Rua, PTA pager [phone]   Pt observed walking in halls with PA earlier then refused PT after.    Joycelyn Rua, PTA pager [phone]

## 2016-02-10 LAB — BASIC METABOLIC PANEL
Anion gap: 9 (ref 5–15)
BUN: 8 mg/dL (ref 6–20)
CO2: 28 mmol/L (ref 22–32)
CREATININE: 0.6 mg/dL (ref 0.44–1.00)
Calcium: 8.5 mg/dL — ABNORMAL LOW (ref 8.9–10.3)
Chloride: 105 mmol/L (ref 101–111)
Glucose, Bld: 94 mg/dL (ref 65–99)
POTASSIUM: 3.8 mmol/L (ref 3.5–5.1)
SODIUM: 142 mmol/L (ref 135–145)

## 2016-02-10 LAB — CBC
HCT: 32.5 % — ABNORMAL LOW (ref 36.0–46.0)
HEMOGLOBIN: 10.7 g/dL — AB (ref 12.0–15.0)
MCH: 30.7 pg (ref 26.0–34.0)
MCHC: 32.9 g/dL (ref 30.0–36.0)
MCV: 93.1 fL (ref 78.0–100.0)
Platelets: 110 10*3/uL — ABNORMAL LOW (ref 150–400)
RBC: 3.49 MIL/uL — AB (ref 3.87–5.11)
RDW: 16.8 % — ABNORMAL HIGH (ref 11.5–15.5)
WBC: 9.1 10*3/uL (ref 4.0–10.5)

## 2016-02-10 MED ORDER — DOCUSATE SODIUM 100 MG PO CAPS: ORAL_CAPSULE | ORAL | Status: DC

## 2016-02-10 MED ORDER — CEPHALEXIN 500 MG PO CAPS: 500.0000 mg | ORAL_CAPSULE | Freq: Four times a day (QID) | ORAL | Status: DC

## 2016-02-10 MED ORDER — ACETAMINOPHEN 325 MG PO TABS
650.0000 mg | ORAL_TABLET | ORAL | Status: DC
Start: 2016-02-10 — End: 2016-10-05

## 2016-02-10 MED ORDER — ENOXAPARIN SODIUM 30 MG/0.3ML ~~LOC~~ SOLN: SUBCUTANEOUS | Status: DC

## 2016-02-10 MED ORDER — OXYCODONE HCL 5 MG PO TABS: ORAL_TABLET | ORAL | Status: DC

## 2016-02-10 NOTE — Discharge Summary (Signed)
Patient ID: RANIE CHINCHILLA MRN: [id number] DOB/AGE: [birth year] 55 y.o.  Admit date: 02/07/2016 Discharge date: 02/10/2016  Admission Diagnoses:  Principal Problem:   Primary localized osteoarthritis of left knee Active Problems:   Obstructive sleep apnea   RESTLESS LEGS SYNDROME   Moderate intermittent asthma without complication   Irritable bowel syndrome   CVA (cerebral infarction) history   QT prolongation   Patent foramen ovale   Panic anxiety syndrome   Complex partial epilepsy (HCC)   Rheumatoid arthritis (HCC)   Ventricular septal defect   Postoperative anemia due to acute blood loss   Discharge Diagnoses:  Same  Past Medical History  Diagnosis Date  . IBS (irritable bowel syndrome)     mixed  . Allergic rhinitis     uses Flonase daily as needed and takes CLaritin daily  . Eczema   . Plaque psoriasis   . Cerebral vascular malformation     sees dr Lovell Sheehan for monitoring as needed, sees dr lewitt for headaches every 4 months  . Lactose intolerance   . Anxiety     takes Xanax daily as needed  . Asthma     Albuterol inhaler prn;SYmbicort daily  . Depression     takes Citalopram daily  . Rheumatoid arthritis(714.0) 2010    oa and ra;Rhemicade IV every 6wks and Metotrexate weekly  . Nausea     takes Zofran daily as needed  . GERD (gastroesophageal reflux disease)     takes Protonix daily  . Hyperlipidemia     takes Pravastatin daily  . PONV (postoperative nausea and vomiting)   . Shortness of breath     with exertion  . Pneumonia 75yrs ago    hx of  . History of bronchitis as a child   . History of migraine     last one 10+yrs ago  . Stroke (HCC) 03/26/2013    left sided weakness  . Joint pain   . Joint swelling   . Fibromyalgia   . Diverticulitis at age 67  . History of kidney stones   . Thyroid cyst   . Insomnia   . History of staph infection 74yrs ago  . Ventricular septal defect     2014 TEE lists large right to left shunt ASD/PFO (NO VSD  mentioned)  . Sleep apnea     study done >31yrs ago;uses CPAP nightly  . PFO (patent foramen ovale)   . Primary localized osteoarthritis of right knee 11/03/2015  . Primary localized osteoarthritis of left knee   . Spinal headache     patient states that she thinks she had a spinal headache a long time ago  . Seizures (HCC) 23months ago 03/21/14    takes Depakote daily  . COPD (chronic obstructive pulmonary disease) (HCC)   . Stress incontinence   . Hard of hearing   . Postoperative anemia due to acute blood loss 02/08/2016    Surgeries: Procedure(s): TOTAL KNEE ARTHROPLASTY on 02/07/2016   Consultants:    Discharged Condition: Improved  Hospital Course: ADDALYNE VANDEHEI is an 55 y.o. female who was admitted 02/07/2016 for operative treatment ofPrimary localized osteoarthritis of left knee. Patient has severe unremitting pain that affects sleep, daily activities, and work/hobbies. After pre-op clearance the patient was taken to the operating room on 02/07/2016 and underwent  Procedure(s): TOTAL KNEE ARTHROPLASTY.    Patient was given perioperative antibiotics:      Anti-infectives    Start     Dose/Rate Route Frequency Ordered Stop  02/10/16 0000  cephALEXin (KEFLEX) 500 MG capsule     500 mg Oral 4 times daily 02/10/16 1314     02/09/16 1400  cephALEXin (KEFLEX) capsule 500 mg     500 mg Oral Every 6 hours 02/09/16 1305     02/07/16 2000  vancomycin (VANCOCIN) IVPB 1000 mg/200 mL premix     1,000 mg 200 mL/hr over 60 Minutes Intravenous Every 12 hours 02/07/16 1316 02/07/16 2121   02/07/16 0830  vancomycin (VANCOCIN) IVPB 1000 mg/200 mL premix     1,000 mg 200 mL/hr over 60 Minutes Intravenous On call to O.R. 02/07/16 [id number] 02/07/16 0915   02/06/16 0707  vancomycin (VANCOCIN) IVPB 1000 mg/200 mL premix     1,000 mg 200 mL/hr over 60 Minutes Intravenous On call to O.R. 02/06/16 2841 02/07/16 0559       Patient was given sequential compression devices, early ambulation, and  chemoprophylaxis to prevent DVT.  Post op day one hemoglobin was 7.4 so she was transfused 2 units of PRBCs with 20 mg lasix between units.  Post op day 2 patient was significantly somnolent and a big fall risk as knee is buckling.  Due to her sedation we started tylenol 650 mg q 4 hrs and only giving 1 oxycodone 5mg  when she needed it for breakthrough pain.   Post op day two patient also developed thigh redness that appeared to be early cellulitis.  She was start on Keflex 500mg  QID.  Patient benefited maximally from hospital stay and there were no other complications.    Recent vital signs:  Patient Vitals for the past 24 hrs:  BP Temp Temp src Pulse Resp SpO2  02/10/16 0500 (!) 150/62 mmHg 99 F (37.2 C) Oral 79 20 98 %  02/09/16 2148 (!) 152/72 mmHg 99.1 F (37.3 C) Oral 75 20 98 %  02/09/16 2144 - - - 71 19 98 %  02/09/16 1500 (!) 145/78 mmHg 99.1 F (37.3 C) Oral 76 20 98 %     Recent laboratory studies:   Recent Labs  02/09/16 0355 02/10/16 0522  WBC 11.0* 9.1  HGB 11.4* 10.7*  HCT 33.6* 32.5*  PLT 102* 110*  NA 142 142  K 3.6 3.8  CL 106 105  CO2 23 28  BUN 5* 8  CREATININE 0.61 0.60  GLUCOSE 114* 94  CALCIUM 8.8* 8.5*     Discharge Medications:     Medication List    STOP taking these medications        HYDROcodone-acetaminophen 5-325 MG tablet  Commonly known as:  NORCO/VICODIN     methotrexate 1 g injection  Commonly known as:  50 mg/ml     metoCLOPramide 5 MG tablet  Commonly known as:  REGLAN      TAKE these medications        acetaminophen 325 MG tablet  Commonly known as:  TYLENOL  Take 2 tablets (650 mg total) by mouth every 4 (four) hours.     albuterol 108 (90 Base) MCG/ACT inhaler  Commonly known as:  PROVENTIL HFA;VENTOLIN HFA  Inhale 2 puffs into the lungs every 6 (six) hours as needed for wheezing or shortness of breath.     ALPRAZolam 0.5 MG tablet  Commonly known as:  XANAX  Take 0.5 mg by mouth 2 (two) times daily as needed for  anxiety.     cephALEXin 500 MG capsule  Commonly known as:  KEFLEX  Take 1 capsule (500 mg total) by mouth 4 (four)  times daily.     citalopram 20 MG tablet  Commonly known as:  CELEXA  TAKE TWO TABLETS BY MOUTH DAILY     clobetasol ointment 0.05 %  Commonly known as:  TEMOVATE  Apply 1 application topically 2 (two) times daily as needed (inflammation on feet.).     cycloSPORINE 0.05 % ophthalmic emulsion  Commonly known as:  RESTASIS  Place 4 drops into both eyes 2 (two) times daily.     divalproex 500 MG 24 hr tablet  Commonly known as:  DEPAKOTE ER  TAKE TWO TABLETS BY MOUTH TWICE DAILY     docusate sodium 100 MG capsule  Commonly known as:  COLACE  1 tab 2 times a day while on narcotics.  STOOL SOFTENER     enoxaparin 30 MG/0.3ML injection  Commonly known as:  LOVENOX  Inject 0.30ml every 12 hrs to prevent blood clots    BLOOD THINNER     fluticasone 50 MCG/ACT nasal spray  Commonly known as:  FLONASE  Place 2 sprays into both nostrils daily as needed for allergies.     folic acid 1 MG tablet  Commonly known as:  FOLVITE  Take 1 mg by mouth daily.     loratadine 10 MG tablet  Commonly known as:  CLARITIN  Take 10 mg by mouth daily.     multivitamin capsule  Take 1 capsule by mouth daily.     NON FORMULARY  Place 1 spray into the nose 2 (two) times daily as needed. Compounded Lidocaine Spray.  Use as directed for headaches. ([facility])     oxyCODONE 5 MG immediate release tablet  Commonly known as:  Oxy IR/ROXICODONE  1 tablets every 4-6 hrs as needed for pain     pantoprazole 40 MG tablet  Commonly known as:  PROTONIX  Take 40 mg by mouth daily.     polyethylene glycol packet  Commonly known as:  MIRALAX / GLYCOLAX  17grams in 16 oz of water twice a day until bowel movement.  LAXITIVE.  Restart if two days since last bowel movement     pravastatin 20 MG tablet  Commonly known as:  PRAVACHOL  Take 20 mg by mouth daily.     PROBIOTIC DAILY  PO  Take 1 capsule by mouth daily.     topiramate 50 MG tablet  Commonly known as:  TOPAMAX  TAKE ONE TABLET BY MOUTH TWICE DAILY        Diagnostic Studies: No results found.  Disposition: 06-Home-Health Care Svc  Discharge Instructions    CPM    Complete by:  As directed   Continuous passive motion machine (CPM):      Use the CPM from 0 to 90 for 6 hours per day.       You may break it up into 2 or 3 sessions per day.      Use CPM for 2 weeks or until you are told to stop.  WHEN NOT IN CPM  USE ZERO KNEE GRAY BLOCK AT ALL TIMES     Call MD / Call 911    Complete by:  As directed   If you experience chest pain or shortness of breath, CALL 911 and be transported to the hospital emergency room.  If you develope a fever above 101 F, pus (white drainage) or increased drainage or redness at the wound, or calf pain, call your surgeon's office.     Change dressing    Complete by:  As directed   Change the gauze dressing daily with sterile 4 x 4 inch gauze and apply TED hose.  DO NOT REMOVE BANDAGE OVER SURGICAL INCISION.  WASH WHOLE LEG INCLUDING OVER THE WATERPROOF BANDAGE WITH SOAP AND WATER EVERY DAY.     Constipation Prevention    Complete by:  As directed   Drink plenty of fluids.  Prune juice may be helpful.  You may use a stool softener, such as Colace (over the counter) 100 mg twice a day.  Use MiraLax (over the counter) for constipation as needed.     Diet - low sodium heart healthy    Complete by:  As directed      Discharge instructions    Complete by:  As directed   INSTRUCTIONS AFTER JOINT REPLACEMENT   Remove items at home which could result in a fall. This includes throw rugs or furniture in walking pathways ICE to the affected joint every three hours while awake for 30 minutes at a time, for at least the first 3-5 days, and then as needed for pain and swelling.  Continue to use ice for pain and swelling. You may notice swelling that will progress down to the foot and  ankle.  This is normal after surgery.  Elevate your leg when you are not up walking on it.   Continue to use the breathing machine you got in the hospital (incentive spirometer) which will help keep your temperature down.  It is common for your temperature to cycle up and down following surgery, especially at night when you are not up moving around and exerting yourself.  The breathing machine keeps your lungs expanded and your temperature down.   DIET:  As you were doing prior to hospitalization, we recommend a well-balanced diet.  DRESSING / WOUND CARE / SHOWERING  Keep the surgical dressing until follow up.  The dressing is water proof, so you can shower without any extra covering.  IF THE DRESSING FALLS OFF or the wound gets wet inside, change the dressing with sterile gauze.  Please use good hand washing techniques before changing the dressing.  Do not use any lotions or creams on the incision until instructed by your surgeon.    ACTIVITY  Increase activity slowly as tolerated, but follow the weight bearing instructions below.   No driving for 6 weeks or until further direction given by your physician.  You cannot drive while taking narcotics.  No lifting or carrying greater than 10 lbs. until further directed by your surgeon. Avoid periods of inactivity such as sitting longer than an hour when not asleep. This helps prevent blood clots.  You may return to work once you are authorized by your doctor.     WEIGHT BEARING   Weight bearing as tolerated with assist device (walker, cane, etc) as directed, use it as long as suggested by your surgeon or therapist, typically at least 2-3 weeks.   EXERCISES  Results after joint replacement surgery are often greatly improved when you follow the exercise, range of motion and muscle strengthening exercises prescribed by your doctor. Safety measures are also important to protect the joint from further injury. Any time any of these exercises cause  you to have increased pain or swelling, decrease what you are doing until you are comfortable again and then slowly increase them. If you have problems or questions, call your caregiver or physical therapist for advice.   Rehabilitation is important following a joint replacement. After just a few  days of immobilization, the muscles of the leg can become weakened and shrink (atrophy).  These exercises are designed to build up the tone and strength of the thigh and leg muscles and to improve motion. Often times heat used for twenty to thirty minutes before working out will loosen up your tissues and help with improving the range of motion but do not use heat for the first two weeks following surgery (sometimes heat can increase post-operative swelling).   These exercises can be done on a training (exercise) mat, on the floor, on a table or on a bed. Use whatever works the best and is most comfortable for you.    Use music or television while you are exercising so that the exercises are a pleasant break in your day. This will make your life better with the exercises acting as a break in your routine that you can look forward to.   Perform all exercises about fifteen times, three times per day or as directed.  You should exercise both the operative leg and the other leg as well.   Exercises include:  Quad Sets - Tighten up the muscle on the front of the thigh (Quad) and hold for 5-10 seconds.   Straight Leg Raises - With your knee straight (if you were given a brace, keep it on), lift the leg to 60 degrees, hold for 3 seconds, and slowly lower the leg.  Perform this exercise against resistance later as your leg gets stronger.  Leg Slides: Lying on your back, slowly slide your foot toward your buttocks, bending your knee up off the floor (only go as far as is comfortable). Then slowly slide your foot back down until your leg is flat on the floor again.  Angel Wings: Lying on your back spread your legs to the  side as far apart as you can without causing discomfort.  Hamstring Strength:  Lying on your back, push your heel against the floor with your leg straight by tightening up the muscles of your buttocks.  Repeat, but this time bend your knee to a comfortable angle, and push your heel against the floor.  You may put a pillow under the heel to make it more comfortable if necessary.   A rehabilitation program following joint replacement surgery can speed recovery and prevent re-injury in the future due to weakened muscles. Contact your doctor or a physical therapist for more information on knee rehabilitation.    CONSTIPATION  Constipation is defined medically as fewer than three stools per week and severe constipation as less than one stool per week.  Even if you have a regular bowel pattern at home, your normal regimen is likely to be disrupted due to multiple reasons following surgery.  Combination of anesthesia, postoperative narcotics, change in appetite and fluid intake all can affect your bowels.   YOU MUST use at least one of the following options; they are listed in order of increasing strength to get the job done.  They are all available over the counter, and you may need to use some, POSSIBLY even all of these options:    Drink plenty of fluids (prune juice may be helpful) and high fiber foods Colace 100 mg by mouth twice a day  Senokot for constipation as directed and as needed Dulcolax (bisacodyl), take with full glass of water  Miralax (polyethylene glycol) once or twice a day as needed.  If you have tried all these things and are unable to have a bowel movement  in the first 3-4 days after surgery call either your surgeon or your primary doctor.    If you experience loose stools or diarrhea, hold the medications until you stool forms back up.  If your symptoms do not get better within 1 week or if they get worse, check with your doctor.  If you experience "the worst abdominal pain ever"  or develop nausea or vomiting, please contact the office immediately for further recommendations for treatment.   ITCHING:  If you experience itching with your medications, try taking only a single pain pill, or even half a pain pill at a time.  You can also use Benadryl over the counter for itching or also to help with sleep.   TED HOSE STOCKINGS:  Use stockings on both legs until for at least 2 weeks or as directed by physician office. They may be removed at night for sleeping.  MEDICATIONS:  See your medication summary on the "After Visit Summary" that nursing will review with you.  You may have some home medications which will be placed on hold until you complete the course of blood thinner medication.  It is important for you to complete the blood thinner medication as prescribed.  PRECAUTIONS:  If you experience chest pain or shortness of breath - call 911 immediately for transfer to the hospital emergency department.   If you develop a fever greater that 101 F, purulent drainage from wound, increased redness or drainage from wound, foul odor from the wound/dressing, or calf pain - CONTACT YOUR SURGEON.                                                   FOLLOW-UP APPOINTMENTS:  If you do not already have a post-op appointment, please call the office for an appointment to be seen by your surgeon.  Guidelines for how soon to be seen are listed in your "After Visit Summary", but are typically between 1-4 weeks after surgery.  OTHER INSTRUCTIONS:   Knee Replacement:  Do not place pillow under knee, focus on keeping the knee straight while resting. CPM instructions: 0-90 degrees, 2 hours in the morning, 2 hours in the afternoon, and 2 hours in the evening. Place foam block, curve side up under heel at all times except when in CPM or when walking.  DO NOT modify, tear, cut, or change the foam block in any way.  MAKE SURE YOU:  Understand these instructions.  Get help right away if you are not  doing well or get worse.    Thank you for letting us be a part of your medical care team.  It is a privilege we respect greatly.  We hope these instructions will help you stay on track for a fast and full recovery!     Do not put a pillow under the knee. Place it under the heel.    Complete by:  As directed   Place gray foam block, curve side up under heel at all times except when in CPM or when walking.  DO NOT modify, tear, cut, or change in any way the gray foam block.     Increase activity slowly as tolerated    Complete by:  As directed      Patient may shower    Complete by:  As directed   Aquacel dressing is water  proof    Wash over it and the whole leg with soap and water at the end of your shower     TED hose    Complete by:  As directed   Use stockings (TED hose) for 2 weeks on both leg(s).  You may remove them at night for sleeping.           Follow-up Information    Follow up with Nilda Simmer, MD On 02/21/2016.   Specialty:  Orthopedic Surgery   Why:  APPT TIME 2:15 PM   Contact information:   [address] [place] [address] [phone]       Follow up with [facility].   Why:  They will contact you to schedule home therapy visits.   Contact information:   [address] [place] [address] [phone]        Signed: Pascal Lux 02/10/2016, 1:20 PM

## 2016-02-10 NOTE — Progress Notes (Signed)
Patient's left inner thigh near groin area is red, swollen and warm to touch. Placed ice pack on area. Will continue to monitor

## 2016-02-10 NOTE — Progress Notes (Signed)
Patient discharge teaching given, including activity, diet, follow-up appoints, and medications. Patient verbalized understanding of all discharge instructions. IV access was d/c'd. Vitals are stable. Skin is intact except as charted in most recent assessments. Pt given 5 oxy prn for ride home. Pt to be escorted out by RN, to be driven home by husband.

## 2016-02-10 NOTE — Progress Notes (Signed)
Physical Therapy Treatment Patient Details Name: [name] MRN: [id number] DOB: 15-Sep-1961 Today's Date: 02/10/2016    History of Present Illness Pt admitted 4/24 for elective L TKA.    PT Comments    Pt alert, largest deficit is pain.  Advances gait, will require stair training and review of HEP this pm before d/c home.    Follow Up Recommendations  Home health PT;Supervision/Assistance - 24 hour     Equipment Recommendations  None recommended by PT    Recommendations for Other Services       Precautions / Restrictions Precautions Precautions: Knee Precaution Booklet Issued: Yes (comment) Precaution Comments: pt with verbal understanding however very sleepy Required Braces or Orthoses: Knee Immobilizer - Left Knee Immobilizer - Left: On when out of bed or walking Restrictions Weight Bearing Restrictions: Yes LLE Weight Bearing: Weight bearing as tolerated    Mobility  Bed Mobility               General bed mobility comments: Pt received in recliner chair.    Transfers Overall transfer level: Needs assistance Equipment used: Rolling walker (2 wheeled) Transfers: Sit to/from Stand Sit to Stand: Supervision Stand pivot transfers: Supervision       General transfer comment: Cues for hand placement and advancement of LLE forward to ease pain.    Ambulation/Gait Ambulation/Gait assistance: Min guard Ambulation Distance (Feet): 140 Feet Assistive device: Rolling walker (2 wheeled)     Gait velocity interpretation: Below normal speed for age/gender General Gait Details: Cues for upper trunk control, forward gaze, L heel strike and L knee extension in stance phase.  Pain limits distance required standing rest periods x4.     Stairs            Wheelchair Mobility    Modified Rankin (Stroke Patients Only)       Balance     Sitting balance-Leahy Scale: Good       Standing balance-Leahy Scale: Fair                       Cognition Arousal/Alertness: Awake/alert Behavior During Therapy: WFL for tasks assessed/performed Overall Cognitive Status: Within Functional Limits for tasks assessed   Orientation Level: Disoriented to;Time                  Exercises Total Joint Exercises Ankle Circles/Pumps: AROM;Left;10 reps;Supine Quad Sets: AROM;Left;10 reps;Supine Gluteal Sets: AROM;Left;10 reps;Supine Heel Slides: Left;10 reps;Supine;AAROM Hip ABduction/ADduction: AAROM;Left;10 reps;Supine Straight Leg Raises: AAROM;Left;10 reps;Supine Goniometric ROM: 10-61 degrees.      General Comments        Pertinent Vitals/Pain Pain Assessment: 0-10 Pain Score: 6  Pain Location: L knee and L groin.   Pain Descriptors / Indicators: Aching Pain Intervention(s): Limited activity within patient's tolerance;Repositioned    Home Living                      Prior Function            PT Goals (current goals can now be found in the care plan section) Acute Rehab PT Goals Patient Stated Goal: home Potential to Achieve Goals: Fair Progress towards PT goals: Progressing toward goals    Frequency  7X/week    PT Plan      Co-evaluation             ,Joycelyn Rua, PTA pager [phone] End of Session Equipment Utilized During Treatment: Gait belt Activity Tolerance: Patient tolerated treatment well Patient  left: in chair;with call bell/phone within reach;with chair alarm set     Time: 0630-1601 PT Time Calculation (min) (ACUTE ONLY): 43 min  Charges:  $Gait Training: 8-22 mins $Therapeutic Exercise: 8-22 mins $Therapeutic Activity: 8-22 mins                    G Codes:      Florestine Avers Feb 20, 2016, 3:09 PM  Joycelyn Rua, PTA pager [phone]

## 2016-02-10 NOTE — Progress Notes (Signed)
Occupational Therapy Treatment Patient Details Name: [name] MRN: [id number] DOB: January 11, 1961 Today's Date: 02/10/2016    History of present illness Pt admitted 4/24 for elective L TKA.   OT comments  Completed education with pt/family. Improved alertness and participation this am. Pt mobilizing for ADL tasks using L KI without knee buckling today. Pt safe to D/C home with initial 24/7 S. OT goals met.   Follow Up Recommendations  No OT follow up;Supervision/Assistance - 24 hour    Equipment Recommendations  None recommended by OT    Recommendations for Other Services      Precautions / Restrictions Precautions Precautions: Knee Required Braces or Orthoses: Knee Immobilizer - Left Knee Immobilizer - Left: On when out of bed or walking Restrictions LLE Weight Bearing: Weight bearing as tolerated       Mobility Bed Mobility Overal bed mobility: Needs Assistance       Supine to sit: Supervision     General bed mobility comments: Educated on techique to use RLE to mobilize LLE out of bed  Transfers Overall transfer level: Needs assistance Equipment used: Rolling walker (2 wheeled) Transfers: Sit to/from [facility] Sit to Stand: Min guard Stand pivot transfers: Min assist       General transfer comment: L knee buckles at times    Balance     Sitting balance-Leahy Scale: Good       Standing balance-Leahy Scale: Fair                     ADL               Lower Body Bathing: Minimal assistance       Lower Body Dressing: Minimal assistance       Toileting- Clothing Manipulation and Hygiene: Supervision/safety       Functional mobility during ADLs: Minimal assistance;Rolling walker;Cueing for safety;Cueing for sequencing General ADL Comments: Completed education with pt/husband regarding compensatory techniques for ADL with DME. Educated on walk in shower transfer technique and need to have assistnace when stepping  over threshold. Rec using KI for shower transfer until L knee does not buckle. husband verbalized understadning. Educated on need to remove throw rugs, uuse appropriate lighting at night and recommended using BSC at night by bed until pt is safer wtih mobility. husband verbalized understading. also discussed importance of proper positioning of LLE in zero form and not putting a towel roll under the knee to "make it more comfortable"      Vision                     Perception     Praxis      Cognition   Behavior During Therapy: [facility] for tasks assessed/performed Overall Cognitive Status: Within Functional Limits for tasks assessed                       Extremity/Trunk Assessment               Exercises     Shoulder Instructions       General Comments      Pertinent Vitals/ Pain       Pain Assessment: 0-10 Pain Score: 5  Pain Location: L knee Pain Descriptors / Indicators: Aching Pain Intervention(s): Limited activity within patient's tolerance  Home Living  Prior Functioning/Environment              Frequency Min 2X/week     Progress Toward Goals  OT Goals(current goals can now be found in the care plan section)  Progress towards OT goals: Goals met/education completed, patient discharged from OT  Acute Rehab OT Goals Patient Stated Goal: home OT Goal Formulation: With patient/family Time For Goal Achievement: 02/16/16 Potential to Achieve Goals: Good ADL Goals Pt Will Perform Lower Body Bathing: with set-up;with supervision;with caregiver independent in assisting;sit to/from stand Pt Will Perform Lower Body Dressing: with set-up;with supervision;with caregiver independent in assisting;sit to/from stand Pt Will Transfer to Toilet: with supervision;ambulating;bedside commode Pt Will Perform Toileting - Clothing Manipulation and hygiene: with supervision;sit to/from stand Pt Will  Perform Tub/Shower Transfer: with min guard assist;with caregiver independent in assisting;rolling walker;3 in 1;ambulating  Plan Discharge plan remains appropriate    Co-evaluation                 End of Session     Activity Tolerance     Patient Left     Nurse Communication          Time: 8343-7357 OT Time Calculation (min): 30 min  Charges: OT General Charges $OT Visit: 1 Procedure OT Treatments $Self Care/Home Management : 23-37 mins  Perl Folmar,HILLARY 02/10/2016, 10:42 AM   Maurie Boettcher, OTR/L  [phone] 02/10/2016

## 2016-02-10 NOTE — Progress Notes (Addendum)
Physical Therapy Treatment Patient Details Name: [name] MRN: [id number] DOB: [birth year] Today's Date: 02/10/2016    History of Present Illness Pt admitted 4/24 for elective L TKA.    PT Comments    Pt performed with improved gait pattern progressing to step through sequencing.  Pt performed stair training.  Will f/u in am to review HEP for d/c.    Follow Up Recommendations  Home health PT;Supervision/Assistance - 24 hour     Equipment Recommendations  None recommended by PT    Recommendations for Other Services       Precautions / Restrictions Precautions Precautions: Knee Precaution Booklet Issued: Yes (comment) Precaution Comments: pt with verbal understanding however very sleepy Required Braces or Orthoses: Knee Immobilizer - Left Knee Immobilizer - Left: On when out of bed or walking Restrictions Weight Bearing Restrictions: Yes LLE Weight Bearing: Weight bearing as tolerated    Mobility  Bed Mobility Overal bed mobility: Needs Assistance Bed Mobility: Supine to Sit;Sit to Supine     Supine to sit: Supervision Sit to supine: Supervision   General bed mobility comments: Max VCs.    Transfers Overall transfer level: Needs assistance Equipment used: Rolling walker (2 wheeled) Transfers: Sit to/from Stand Sit to Stand: Supervision Stand pivot transfers: Supervision       General transfer comment: Cues for hand placement and advancement of LLE forward to ease pain.    Ambulation/Gait Ambulation/Gait assistance: Supervision;Min guard Ambulation Distance (Feet): 340 Feet Assistive device: Rolling walker (2 wheeled) Gait Pattern/deviations: Step-through pattern;Decreased stride length;Decreased stance time - left   Gait velocity interpretation: Below normal speed for age/gender General Gait Details: Cues for upper trunk control, forward gaze, L heel strike and L knee extension in stance phase.  Pain limits distance required standing rest periods x4.     Pt performed stair training x3 stairs backwards with RW and min assist.  Pt required cues for sequencing and RW placement to ensure safe entry into home.   Stairs            Wheelchair Mobility    Modified Rankin (Stroke Patients Only)       Balance Overall balance assessment: Needs assistance   Sitting balance-Leahy Scale: Good       Standing balance-Leahy Scale: Fair                      Cognition Arousal/Alertness: Awake/alert Behavior During Therapy: WFL for tasks assessed/performed Overall Cognitive Status: Within Functional Limits for tasks assessed   Orientation Level: Disoriented to;Time                  Exercises Total Joint Exercises Ankle Circles/Pumps: AROM;Left;10 reps;Supine Quad Sets: AROM;Left;10 reps;Supine Gluteal Sets: AROM;Left;10 reps;Supine Heel Slides: Left;10 reps;Supine;AAROM Hip ABduction/ADduction: AAROM;Left;10 reps;Supine Straight Leg Raises: AAROM;Left;10 reps;Supine Goniometric ROM: 10-61 degrees.      General Comments        Pertinent Vitals/Pain Pain Assessment: 0-10 Pain Score: 6  Pain Location: L groin and knee.   Pain Descriptors / Indicators: Aching Pain Intervention(s): Limited activity within patient's tolerance    Home Living                      Prior Function            PT Goals (current goals can now be found in the care plan section) Acute Rehab PT Goals Patient Stated Goal: home Potential to Achieve Goals: Fair Progress towards PT goals: Progressing toward  goals    Frequency  7X/week    PT Plan      Co-evaluation             End of Session Equipment Utilized During Treatment: Gait belt Activity Tolerance: Patient tolerated treatment well Patient left: with call bell/phone within reach;in bed;with bed alarm set     Time: 1510-1535 PT Time Calculation (min) (ACUTE ONLY): 25 min  Charges:  Charges should read gait and therapeutic exercise.                       G Codes:      Florestine Avers 02-29-16, 4:51 PM  Joycelyn Rua, PTA pager [phone]

## 2016-02-10 NOTE — Care Management Important Message (Signed)
Important Message  Patient Details  Name: [name] MRN: [id number] Date of Birth: [birth year]   Medicare Important Message Given:  Yes    Elzabeth Mcquerry P Welford Christmas 02/10/2016, 3:06 PM

## 2016-02-14 ENCOUNTER — Other Ambulatory Visit: Payer: Self-pay | Admitting: Neurology

## 2016-02-17 ENCOUNTER — Telehealth (HOSPITAL_COMMUNITY): Payer: Self-pay

## 2016-02-17 NOTE — Telephone Encounter (Signed)
Patient have a MD appt  on Monday

## 2016-02-21 ENCOUNTER — Ambulatory Visit (HOSPITAL_COMMUNITY): Payer: Medicare Other | Admitting: Physical Therapy

## 2016-02-24 ENCOUNTER — Ambulatory Visit (HOSPITAL_COMMUNITY): Payer: Medicare Other | Attending: Orthopedic Surgery

## 2016-02-24 DIAGNOSIS — M25562 Pain in left knee: Secondary | ICD-10-CM

## 2016-02-24 DIAGNOSIS — M6281 Muscle weakness (generalized): Secondary | ICD-10-CM | POA: Insufficient documentation

## 2016-02-24 DIAGNOSIS — M25662 Stiffness of left knee, not elsewhere classified: Secondary | ICD-10-CM | POA: Insufficient documentation

## 2016-02-24 DIAGNOSIS — Z96651 Presence of right artificial knee joint: Secondary | ICD-10-CM | POA: Insufficient documentation

## 2016-02-24 DIAGNOSIS — R262 Difficulty in walking, not elsewhere classified: Secondary | ICD-10-CM

## 2016-02-24 DIAGNOSIS — M25661 Stiffness of right knee, not elsewhere classified: Secondary | ICD-10-CM | POA: Diagnosis present

## 2016-02-24 DIAGNOSIS — R269 Unspecified abnormalities of gait and mobility: Secondary | ICD-10-CM | POA: Diagnosis present

## 2016-02-24 DIAGNOSIS — M25561 Pain in right knee: Secondary | ICD-10-CM | POA: Insufficient documentation

## 2016-02-25 NOTE — Therapy (Addendum)
[facility] [address] [place], [place], [address] Phone: [phone]   Fax:  [phone]  Physical Therapy Evaluation  Patient Details  Name: [name] MRN: [id number] Date of Birth: 1961-06-20 Referring Provider: Dr. Salvatore Marvel   Encounter Date: 02/24/2016      PT End of Session - 02/24/16 1252    Visit Number 1   Number of Visits 24   Date for PT Re-Evaluation 03/26/16   Authorization Type Medicare/ BCBS   Authorization Time Period 02/24/2016-04/25/2016   Authorization - Visit Number 1   Authorization - Number of Visits 24   PT Start Time 1036   PT Stop Time 1114   PT Time Calculation (min) 38 min   Activity Tolerance Patient tolerated treatment well;Patient limited by fatigue;Patient limited by pain   Behavior During Therapy [facility] for tasks assessed/performed      Past Medical History  Diagnosis Date  . IBS (irritable bowel syndrome)     mixed  . Allergic rhinitis     uses Flonase daily as needed and takes CLaritin daily  . Eczema   . Plaque psoriasis   . Cerebral vascular malformation     sees dr Lovell Sheehan for monitoring as needed, sees dr lewitt for headaches every 4 months  . Lactose intolerance   . Anxiety     takes Xanax daily as needed  . Asthma     Albuterol inhaler prn;SYmbicort daily  . Depression     takes Citalopram daily  . Rheumatoid arthritis(714.0) 2010    oa and ra;Rhemicade IV every 6wks and Metotrexate weekly  . Nausea     takes Zofran daily as needed  . GERD (gastroesophageal reflux disease)     takes Protonix daily  . Hyperlipidemia     takes Pravastatin daily  . PONV (postoperative nausea and vomiting)   . Shortness of breath     with exertion  . Pneumonia 66yrs ago    hx of  . History of bronchitis as a child   . History of migraine     last one 10+yrs ago  . Stroke (HCC) 03/26/2013    left sided weakness  . Joint pain   . Joint swelling   . Fibromyalgia   . Diverticulitis  at age 35  . History of kidney stones   . Thyroid cyst   . Insomnia   . History of staph infection 40yrs ago  . Ventricular septal defect     2014 TEE lists large right to left shunt ASD/PFO (NO VSD mentioned)  . Sleep apnea     study done >50yrs ago;uses CPAP nightly  . PFO (patent foramen ovale)   . Primary localized osteoarthritis of right knee 11/03/2015  . Primary localized osteoarthritis of left knee   . Spinal headache     patient states that she thinks she had a spinal headache a long time ago  . Seizures (HCC) 1months ago 03/21/14    takes Depakote daily  . COPD (chronic obstructive pulmonary disease) (HCC)   . Stress incontinence   . Hard of hearing   . Postoperative anemia due to acute blood loss 02/08/2016    Past Surgical History  Procedure Laterality Date  . Incontinence surgery  2010    sling done   . Knee arthroscopy  1992    left  . Foot surgery  1999    right ankle  . Kidney stone surgery  2001  . Appendectomy  1981  .  Cholecystectomy  1981  . Tubal ligation  1990  . Total abdominal hysterectomy  2001  . Left foot plating and scarping for arthritis  2011  . Right ear tube insertion  2011  . Incisional hernia repair  05/20/2012    Procedure: HERNIA REPAIR INCISIONAL;  Surgeon: Clovis Pu. Cornett, MD;  Location: [facility] ORS;  Service: General;  Laterality: N/A;  . Tee without cardioversion N/A 05/15/2013    Procedure: TRANSESOPHAGEAL ECHOCARDIOGRAM (TEE);  Surgeon: Pamella Pert, MD;  Location: [facility] ENDOSCOPY;  Service: Cardiovascular;  Laterality: N/A;  . Cardiac catheterization  2004  . Colonoscopy    . Thryoid biopsy    . Tonsillectomy and adenoidectomy  04/01/2014  . Tonsillectomy and adenoidectomy Bilateral 04/01/2014    Procedure: BILATERAL TONSILLECTOMY AND ADENOIDECTOMY;  Surgeon: Darletta Moll, MD;  Location: [facility] OR;  Service: ENT;  Laterality: Bilateral;  . Total knee arthroplasty Right 11/03/2015  . Total knee arthroplasty Right 11/03/2015    Procedure: RIGHT  TOTAL KNEE ARTHROPLASTY/RIGHT;  Surgeon: Salvatore Marvel, MD;  Location: [facility] OR;  Service: Orthopedics;  Laterality: Right;  . Hernia repair  2006    x2  . Esophagogastroduodenoscopy    . Total knee arthroplasty Left 02/07/2016    Procedure: TOTAL KNEE ARTHROPLASTY;  Surgeon: Salvatore Marvel, MD;  Location: [facility] OR;  Service: Orthopedics;  Laterality: Left;    There were no vitals filed for this visit.       Subjective Assessment - 02/25/16 1021    Subjective Left TKA in second week April, History of R TKA on 1/18, DC to home with HHPT getting  4 sessions and now DC.  Pt reports lots of aching, throbbing. Pt has been trying to ambulate frequently at home. Pt reports 5/10 pain currently, on medication as prescribed. Has been attempting icing at home, but it is too painful.     Patient is accompained by: Family member   Pertinent History CVA, DJD, RA, chest pain, and seizures    Limitations Standing;Walking   How long can you sit comfortably? 3-4 minutes then needs to stand and walk some.    How long can you stand comfortably? 2-3 minutes   How long can you walk comfortably? 2-3 minutes   Diagnostic tests N/A   Patient Stated Goals Pt's main goal is to improve her strength, improve her general mobility, and return to leisure activities including hiking and swimming   Currently in Pain? Yes   Pain Score 5    Pain Location Knee   Pain Orientation Left   Pain Descriptors / Indicators Aching;Sore;Throbbing            [facility] PT Assessment - 02/25/16 0001    Assessment   Medical Diagnosis s/p L TKA    Onset Date/Surgical Date 01/22/16   Hand Dominance Left   Next MD Visit unsure, maybe June 6   Prior Therapy Home health physical therapy post op    Restrictions   Weight Bearing Restrictions Yes   LLE Weight Bearing Weight bearing as tolerated   Home Nurse, mental health Private residence   Living Arrangements Spouse/significant other   Available Help at Discharge Family    Type of Home House   Home Access Stairs to enter   Entrance Stairs-Number of Steps 3   Entrance Stairs-Rails None   Home Layout One level   Home Equipment Walker - 2 wheels;Cane - single point;Other (comment);Bedside commode;Shower seat   Prior Function   Level of Independence Needs assistance  with homemaking;Independent with household mobility with device;Independent with basic ADLs   PROM   Left Knee Extension 12   Left Knee Flexion 98   Timed Up and Go Test   Normal TUG (seconds) 18.45                   [facility] Adult PT Treatment/Exercise - 02/25/16 0001    Knee/Hip Exercises: Supine   Quad Sets Strengthening;Left;1 set;15 reps  3sec hold   Heel Slides AROM;1 set;15 reps  3sec hold   Bridges Limitations 1x15 knees at 65*  gentle isometrics,, very painful   Straight Leg Raises 1 set;15 reps   Knee Extension Limitations 12   Knee Flexion Limitations 98                PT Education - 02/24/16 1250    Education provided Yes   Education Details Asked the patient to bring balance between HEP and pain threshold, rather than working so hard that her pain remains high. Reviewed HEP.    Person(s) Educated Patient   Methods Explanation;Demonstration;Handout   Comprehension Verbalized understanding          PT Short Term Goals - 02/25/16 1024    PT SHORT TERM GOAL #1   Title After 2 weeks patient will be independent with TKA protocol HEP in order to further progress with ROM and strength at home.   PT SHORT TERM GOAL #2   Title After 4 weeks, patient will report L Knee pain < 4/10 on a VAS 75% of time in order to improve tolerance with ther ex and ambulation.    PT SHORT TERM GOAL #3   Title After 4 weeks patient will demo improved L knee AAROM to 5-110 degrees in order to improve L knee mobility during funcitonal activities.            PT Long Term Goals - 02/25/16 1025    PT LONG TERM GOAL #1   Title After 8 weeks, patient will report reduced L knee  pain to <3/10 on a VAS in order to improve tolerance with ther ex and ambulation.    PT LONG TERM GOAL #2   Title After 8 weeks, patient will demo improved L knee AAROM to 0-120 degrees in order to improve stair climbing and leisure activities.    PT LONG TERM GOAL #3   Title After 8 weeks, patient will demo improved L quad and HS strength to >4/5 MMT in order to improve performance with outdoor ambulation and transfers.    PT LONG TERM GOAL #4   Title After 8 weks, pt will score <11s on TUG Test to semondtrat eimproved funcitonal strength and activity tolerance to improve safety at home.                Plan - 02/24/16 1253    Clinical Impression Statement Pt presenting ~3weeks postop after L TKA. Pt just recently completed therapy at this clinic for R TKA, familiar to this clinic. Pt demonstrating impairment of stregnth, skin integrity, edema, pain, balance, activity tolerance, and joint mobility, limiting ability to perform IADL, housework, and household distance AMB. Pt will benefit from skilled PT intervention to address the above defciits to return to PLOF, improve functional indep in IADL, patient safety, and returnt o community distance AMB.    Rehab Potential Good   PT Frequency 3x / week   PT Duration 8 weeks   PT Treatment/Interventions ADLs/Self Care Home Management;Cryotherapy;Electrical Stimulation;Moist Heat;Balance training;Therapeutic exercise;Therapeutic  activities;Functional mobility training;Stair training;Gait training;DME Instruction;Neuromuscular re-education;Patient/family education;Manual techniques;Taping;Passive range of motion;Scar mobilization   PT Next Visit Plan Review HEP; add in supine marching for AAROM kee flexion, hamstring bridges, short arc quads, and standing TKE. Assess if pt tolerates. Consider Warmup on NuStep.   PT Home Exercise Plan Issued: Heel slides, quad sets, SLR, LAQ, bridging, and Abd heel slides.    Consulted and Agree with Plan of  Care Patient      Patient will benefit from skilled therapeutic intervention in order to improve the following deficits and impairments:  Abnormal gait, Decreased range of motion, Difficulty walking, Pain, Decreased balance, Hypomobility, Impaired flexibility, Increased edema, Decreased strength, Decreased mobility, Decreased skin integrity, Decreased activity tolerance, Decreased scar mobility  G-Codes - 2016-03-03 1025    Functional Assessment Tool Used Clinical judgement based on ROM, functional strength and mobility   Functional Limitation Mobility: Walking and moving around   Mobility: Walking and Moving Around Current Status [phone]) At least 40 percent but less than 60 percent impaired, limited or restricted   Mobility: Walking and Moving Around Goal Status [phone]) At least 20 percent but less than 40 percent impaired, limited or restricted             Visit Diagnosis: Pain in left knee - Plan: PT plan of care cert/re-cert  Stiffness of left knee, not elsewhere classified - Plan: PT plan of care cert/re-cert  Difficulty in walking, not elsewhere classified - Plan: PT plan of care cert/re-cert     Problem List Patient Active Problem List   Diagnosis Date Noted  . Postoperative anemia due to acute blood loss 02/08/2016  . Primary localized osteoarthritis of left knee   . Primary localized osteoarthritis of right knee 11/03/2015  . DJD (degenerative joint disease) of knee 11/03/2015  . Rheumatoid arthritis (HCC)   . PONV (postoperative nausea and vomiting)   . Seizures (HCC)   . Fibromyalgia   . Sleep apnea   . Ventricular septal defect   . History of staph infection   . Refractory migraine 08/10/2014  . Complex partial epilepsy (HCC) 08/10/2014  . S/P tonsillectomy and adenoidectomy 04/01/2014  . Panic anxiety syndrome 06/03/2013  . Positive depression screening 06/03/2013  . Headache(784.0) 06/03/2013  . Unspecified cerebral artery occlusion with  cerebral infarction 06/03/2013  . Headache 05/29/2013  . Patent foramen ovale 05/29/2013  . Chest pain 05/29/2013  . Other convulsions 05/29/2013  . Dysphagia, unspecified(787.20) 04/17/2013  . Acute bronchitis 03/27/2013  . Persistent headaches 03/26/2013  . CVA (cerebral infarction) history 03/26/2013  . Hypokalemia 03/26/2013  . Leukocytosis 03/26/2013  . Arthritis 03/26/2013  . QT prolongation 03/26/2013  . Stroke (HCC) 03/26/2013  . Recurrent ventral incisional hernia 04/19/2012  . Obstructive sleep apnea 08/22/2010  . RESTLESS LEGS SYNDROME 08/22/2010  . Moderate intermittent asthma without complication 08/22/2010  . ECZEMA 08/22/2010  . Irritable bowel syndrome 06/23/2010  . DIARRHEA 06/23/2010  . NAUSEA WITH VOMITING 11/01/2009  . ABDOMINAL PAIN, LOWER 11/01/2009  . KNEE, ARTHRITIS, DEGEN./OSTEO 11/18/2008  . DERANGEMENT MENISCUS 11/02/2008  . JOINT EFFUSION, KNEE 11/02/2008  . KNEE PAIN 11/02/2008    10:31 AM, 02/25/2016 Rosamaria Lints, PT, DPT PRN Physical Therapist - [place] License # [id number] [phone] (mobile)   [facility] [address] [place], [place], [address] Phone: [phone]   Fax:  [phone]  Name: [name] MRN: [id number] Date of Birth: 1960/10/31  *Addendum made to add  G-codes based on examination findings on 02/24/16.   2:00 PM, 04/10/2016 Rosamaria Lints, PT, DPT Physical Therapist at [facility] [phone] (office)

## 2016-02-28 ENCOUNTER — Ambulatory Visit (HOSPITAL_COMMUNITY): Payer: Medicare Other | Admitting: Physical Therapy

## 2016-02-28 DIAGNOSIS — M25562 Pain in left knee: Secondary | ICD-10-CM

## 2016-02-28 DIAGNOSIS — R262 Difficulty in walking, not elsewhere classified: Secondary | ICD-10-CM

## 2016-02-28 DIAGNOSIS — M25662 Stiffness of left knee, not elsewhere classified: Secondary | ICD-10-CM

## 2016-02-28 DIAGNOSIS — M6281 Muscle weakness (generalized): Secondary | ICD-10-CM

## 2016-02-28 NOTE — Therapy (Signed)
[facility] [address] [place], [place], [address] Phone: [phone]   Fax:  [phone]  Physical Therapy Treatment  Patient Details  Name: [name] MRN: [id number] Date of Birth: 02-06-1961 Referring Provider: Dr. Salvatore Marvel   Encounter Date: 02/28/2016      PT End of Session - 02/28/16 1410    Visit Number 2   Number of Visits 24   Date for PT Re-Evaluation 03/26/16   Authorization Type Medicare/ BCBS   Authorization Time Period 02/24/2016-04/25/2016   Authorization - Visit Number 2   Authorization - Number of Visits 24   PT Start Time 1347   PT Stop Time 1425   PT Time Calculation (min) 38 min   Activity Tolerance Patient tolerated treatment well;Patient limited by fatigue   Behavior During Therapy [facility] for tasks assessed/performed      Past Medical History  Diagnosis Date  . IBS (irritable bowel syndrome)     mixed  . Allergic rhinitis     uses Flonase daily as needed and takes CLaritin daily  . Eczema   . Plaque psoriasis   . Cerebral vascular malformation     sees dr Lovell Sheehan for monitoring as needed, sees dr lewitt for headaches every 4 months  . Lactose intolerance   . Anxiety     takes Xanax daily as needed  . Asthma     Albuterol inhaler prn;SYmbicort daily  . Depression     takes Citalopram daily  . Rheumatoid arthritis(714.0) 2010    oa and ra;Rhemicade IV every 6wks and Metotrexate weekly  . Nausea     takes Zofran daily as needed  . GERD (gastroesophageal reflux disease)     takes Protonix daily  . Hyperlipidemia     takes Pravastatin daily  . PONV (postoperative nausea and vomiting)   . Shortness of breath     with exertion  . Pneumonia 39yrs ago    hx of  . History of bronchitis as a child   . History of migraine     last one 10+yrs ago  . Stroke (HCC) 03/26/2013    left sided weakness  . Joint pain   . Joint swelling   . Fibromyalgia   . Diverticulitis at age 22  . History of  kidney stones   . Thyroid cyst   . Insomnia   . History of staph infection 82yrs ago  . Ventricular septal defect     2014 TEE lists large right to left shunt ASD/PFO (NO VSD mentioned)  . Sleep apnea     study done >21yrs ago;uses CPAP nightly  . PFO (patent foramen ovale)   . Primary localized osteoarthritis of right knee 11/03/2015  . Primary localized osteoarthritis of left knee   . Spinal headache     patient states that she thinks she had a spinal headache a long time ago  . Seizures (HCC) 16months ago 03/21/14    takes Depakote daily  . COPD (chronic obstructive pulmonary disease) (HCC)   . Stress incontinence   . Hard of hearing   . Postoperative anemia due to acute blood loss 02/08/2016    Past Surgical History  Procedure Laterality Date  . Incontinence surgery  2010    sling done   . Knee arthroscopy  1992    left  . Foot surgery  1999    right ankle  . Kidney stone surgery  2001  . Appendectomy  1981  . Cholecystectomy  1981  . Tubal ligation  1990  . Total abdominal hysterectomy  2001  . Left foot plating and scarping for arthritis  2011  . Right ear tube insertion  2011  . Incisional hernia repair  05/20/2012    Procedure: HERNIA REPAIR INCISIONAL;  Surgeon: Clovis Pu. Cornett, MD;  Location: [facility] ORS;  Service: General;  Laterality: N/A;  . Tee without cardioversion N/A 05/15/2013    Procedure: TRANSESOPHAGEAL ECHOCARDIOGRAM (TEE);  Surgeon: Pamella Pert, MD;  Location: [facility] ENDOSCOPY;  Service: Cardiovascular;  Laterality: N/A;  . Cardiac catheterization  2004  . Colonoscopy    . Thryoid biopsy    . Tonsillectomy and adenoidectomy  04/01/2014  . Tonsillectomy and adenoidectomy Bilateral 04/01/2014    Procedure: BILATERAL TONSILLECTOMY AND ADENOIDECTOMY;  Surgeon: Darletta Moll, MD;  Location: [facility] OR;  Service: ENT;  Laterality: Bilateral;  . Total knee arthroplasty Right 11/03/2015  . Total knee arthroplasty Right 11/03/2015    Procedure: RIGHT TOTAL KNEE  ARTHROPLASTY/RIGHT;  Surgeon: Salvatore Marvel, MD;  Location: [facility] OR;  Service: Orthopedics;  Laterality: Right;  . Hernia repair  2006    x2  . Esophagogastroduodenoscopy    . Total knee arthroplasty Left 02/07/2016    Procedure: TOTAL KNEE ARTHROPLASTY;  Surgeon: Salvatore Marvel, MD;  Location: [facility] OR;  Service: Orthopedics;  Laterality: Left;    There were no vitals filed for this visit.      Subjective Assessment - 02/28/16 1348    Subjective Pt reports she had a rough night last night with increased soreness reported. She has been doing her HEP without much difficulty and walking as much as she can. She rates 6/10 pain currently deep in the knee.    Pertinent History CVA, DJD, RA, chest pain, and seizures    Currently in Pain? Yes   Pain Score 6    Pain Location Knee   Pain Orientation Left   Pain Descriptors / Indicators Aching;Sore   Pain Type Surgical pain            [facility] PT Assessment - 02/28/16 0001    PROM   Left Knee Extension 11   Left Knee Flexion 110                     [facility] Adult PT Treatment/Exercise - 02/28/16 0001    Ambulation/Gait   Stairs Yes   Stairs Assistance 6: Modified independent (Device/Increase time)   Stair Management Technique Two rails;Other (comment)   Number of Stairs 4  x3 RT   Height of Stairs 6   Gait Comments ascend descend with step to pattern and increased use of UE for support, decreased L knee flexion with verbal cues to correct   Knee/Hip Exercises: Standing   Other Standing Knee Exercises retrostepping with BUe support in // bars x2 min    Knee/Hip Exercises: Supine   Quad Sets Left;1 set;10 reps  5 sec hold   Heel Slides AAROM;Left;1 set;15 reps   Knee/Hip Exercises: Sidelying   Clams x15 reps with red TB each side   Manual Therapy   Manual Therapy Passive ROM   Manual therapy comments Performed separately from all other interventions   Passive ROM terminal knee extension 8x10 sec hold                 PT Education - 02/28/16 1408    Education provided Yes   Education Details Reviewed/updated HEP; discussed importance of getting knee extension early on; encouraged icing  for edema management   Person(s) Educated Patient   Methods Explanation;Demonstration;Handout   Comprehension Verbalized understanding;Returned demonstration          PT Short Term Goals - 02/25/16 1024    PT SHORT TERM GOAL #1   Title After 2 weeks patient will be independent with TKA protocol HEP in order to further progress with ROM and strength at home.   PT SHORT TERM GOAL #2   Title After 4 weeks, patient will report L Knee pain < 4/10 on a VAS 75% of time in order to improve tolerance with ther ex and ambulation.    PT SHORT TERM GOAL #3   Title After 4 weeks patient will demo improved L knee AAROM to 5-110 degrees in order to improve L knee mobility during funcitonal activities.            PT Long Term Goals - 02/25/16 1025    PT LONG TERM GOAL #1   Title After 8 weeks, patient will report reduced L knee pain to <3/10 on a VAS in order to improve tolerance with ther ex and ambulation.    PT LONG TERM GOAL #2   Title After 8 weeks, patient will demo improved L knee AAROM to 0-120 degrees in order to improve stair climbing and leisure activities.    PT LONG TERM GOAL #3   Title After 8 weeks, patient will demo improved L quad and HS strength to >4/5 MMT in order to improve performance with outdoor ambulation and transfers.    PT LONG TERM GOAL #4   Title After 8 weks, pt will score <11s on TUG Test to semondtrat eimproved funcitonal strength and activity tolerance to improve safety at home.                Plan - 02/28/16 1428    Clinical Impression Statement Today's session focused on therex to improve LLE ROM and strength. Pt with some swelling and increased pain at the start of this session. Also reporting increased fear when performing stair negotiation. Therapist provided minimal cues for  increased L knee flexion during, which pt was able to correct by the end of today's session. No increased pain reported post manual treatment. Will continue with current POC.    Rehab Potential Good   PT Frequency 3x / week   PT Duration 8 weeks   PT Treatment/Interventions ADLs/Self Care Home Management;Cryotherapy;Electrical Stimulation;Moist Heat;Balance training;Therapeutic exercise;Therapeutic activities;Functional mobility training;Stair training;Gait training;DME Instruction;Neuromuscular re-education;Patient/family education;Manual techniques;Taping;Passive range of motion;Scar mobilization   PT Next Visit Plan Review HEP; add in supine marching for AAROM kee flexion, hamstring bridges, short arc quads, and standing TKE. Assess if pt tolerates. Consider Warmup on NuStep.   PT Home Exercise Plan Issued: Heel slides, quad sets, SLR, LAQ, bridging, and Abd heel slides; udpated with knee extension ROM hold   Consulted and Agree with Plan of Care Patient      Patient will benefit from skilled therapeutic intervention in order to improve the following deficits and impairments:  Abnormal gait, Decreased range of motion, Difficulty walking, Pain, Decreased balance, Hypomobility, Impaired flexibility, Increased edema, Decreased strength, Decreased mobility, Decreased skin integrity, Decreased activity tolerance, Decreased scar mobility  Visit Diagnosis: Pain in left knee  Stiffness of left knee, not elsewhere classified  Difficulty in walking, not elsewhere classified  Muscle weakness     Problem List Patient Active Problem List   Diagnosis Date Noted  . Postoperative anemia due to acute blood loss 02/08/2016  .  Primary localized osteoarthritis of left knee   . Primary localized osteoarthritis of right knee 11/03/2015  . DJD (degenerative joint disease) of knee 11/03/2015  . Rheumatoid arthritis (HCC)   . PONV (postoperative nausea and vomiting)   . Seizures (HCC)   .  Fibromyalgia   . Sleep apnea   . Ventricular septal defect   . History of staph infection   . Refractory migraine 08/10/2014  . Complex partial epilepsy (HCC) 08/10/2014  . S/P tonsillectomy and adenoidectomy 04/01/2014  . Panic anxiety syndrome 06/03/2013  . Positive depression screening 06/03/2013  . Headache(784.0) 06/03/2013  . Unspecified cerebral artery occlusion with cerebral infarction 06/03/2013  . Headache 05/29/2013  . Patent foramen ovale 05/29/2013  . Chest pain 05/29/2013  . Other convulsions 05/29/2013  . Dysphagia, unspecified(787.20) 04/17/2013  . Acute bronchitis 03/27/2013  . Persistent headaches 03/26/2013  . CVA (cerebral infarction) history 03/26/2013  . Hypokalemia 03/26/2013  . Leukocytosis 03/26/2013  . Arthritis 03/26/2013  . QT prolongation 03/26/2013  . Stroke (HCC) 03/26/2013  . Recurrent ventral incisional hernia 04/19/2012  . Obstructive sleep apnea 08/22/2010  . RESTLESS LEGS SYNDROME 08/22/2010  . Moderate intermittent asthma without complication 08/22/2010  . ECZEMA 08/22/2010  . Irritable bowel syndrome 06/23/2010  . DIARRHEA 06/23/2010  . NAUSEA WITH VOMITING 11/01/2009  . ABDOMINAL PAIN, LOWER 11/01/2009  . KNEE, ARTHRITIS, DEGEN./OSTEO 11/18/2008  . DERANGEMENT MENISCUS 11/02/2008  . JOINT EFFUSION, KNEE 11/02/2008  . KNEE PAIN 11/02/2008   2:33 PM,02/28/2016 Marylyn Ishihara PT, DPT [facility] [phone]  [facility] [address] [place], [place], [address] Phone: [phone]   Fax:  [phone]  Name: [name] MRN: [id number] Date of Birth: 02/17/1961

## 2016-03-02 ENCOUNTER — Ambulatory Visit (HOSPITAL_COMMUNITY): Payer: Medicare Other | Admitting: Physical Therapy

## 2016-03-02 DIAGNOSIS — M25662 Stiffness of left knee, not elsewhere classified: Secondary | ICD-10-CM

## 2016-03-02 DIAGNOSIS — M25562 Pain in left knee: Secondary | ICD-10-CM

## 2016-03-02 DIAGNOSIS — R262 Difficulty in walking, not elsewhere classified: Secondary | ICD-10-CM

## 2016-03-02 NOTE — Therapy (Signed)
[facility] [address] [place], [place], [address] Phone: [phone]   Fax:  [phone]  Physical Therapy Treatment  Patient Details  Name: [name] MRN: [id number] Date of Birth: [birth year] Referring Provider: Dr. Salvatore Marvel   Encounter Date: 03/02/2016      PT End of Session - 03/02/16 1616    Visit Number 3   Number of Visits 24   Date for PT Re-Evaluation 03/26/16   Authorization Type Medicare/ BCBS   Authorization Time Period 02/24/2016-04/25/2016   Authorization - Visit Number 3   Authorization - Number of Visits 24   PT Start Time 1345   PT Stop Time 1425  not including on bike   PT Time Calculation (min) 40 min   Activity Tolerance Patient tolerated treatment well   Behavior During Therapy [facility] for tasks assessed/performed      Past Medical History  Diagnosis Date  . IBS (irritable bowel syndrome)     mixed  . Allergic rhinitis     uses Flonase daily as needed and takes CLaritin daily  . Eczema   . Plaque psoriasis   . Cerebral vascular malformation     sees dr Lovell Sheehan for monitoring as needed, sees dr lewitt for headaches every 4 months  . Lactose intolerance   . Anxiety     takes Xanax daily as needed  . Asthma     Albuterol inhaler prn;SYmbicort daily  . Depression     takes Citalopram daily  . Rheumatoid arthritis(714.0) 2010    oa and ra;Rhemicade IV every 6wks and Metotrexate weekly  . Nausea     takes Zofran daily as needed  . GERD (gastroesophageal reflux disease)     takes Protonix daily  . Hyperlipidemia     takes Pravastatin daily  . PONV (postoperative nausea and vomiting)   . Shortness of breath     with exertion  . Pneumonia 73yrs ago    hx of  . History of bronchitis as a child   . History of migraine     last one 10+yrs ago  . Stroke (HCC) 03/26/2013    left sided weakness  . Joint pain   . Joint swelling   . Fibromyalgia   . Diverticulitis at age 60  . History of  kidney stones   . Thyroid cyst   . Insomnia   . History of staph infection 56yrs ago  . Ventricular septal defect     2014 TEE lists large right to left shunt ASD/PFO (NO VSD mentioned)  . Sleep apnea     study done >40yrs ago;uses CPAP nightly  . PFO (patent foramen ovale)   . Primary localized osteoarthritis of right knee 11/03/2015  . Primary localized osteoarthritis of left knee   . Spinal headache     patient states that she thinks she had a spinal headache a long time ago  . Seizures (HCC) 64months ago 03/21/14    takes Depakote daily  . COPD (chronic obstructive pulmonary disease) (HCC)   . Stress incontinence   . Hard of hearing   . Postoperative anemia due to acute blood loss 02/08/2016    Past Surgical History  Procedure Laterality Date  . Incontinence surgery  2010    sling done   . Knee arthroscopy  1992    left  . Foot surgery  1999    right ankle  . Kidney stone surgery  2001  . Appendectomy  1981  .  Cholecystectomy  1981  . Tubal ligation  1990  . Total abdominal hysterectomy  2001  . Left foot plating and scarping for arthritis  2011  . Right ear tube insertion  2011  . Incisional hernia repair  05/20/2012    Procedure: HERNIA REPAIR INCISIONAL;  Surgeon: Clovis Pu. Cornett, MD;  Location: [facility] ORS;  Service: General;  Laterality: N/A;  . Tee without cardioversion N/A 05/15/2013    Procedure: TRANSESOPHAGEAL ECHOCARDIOGRAM (TEE);  Surgeon: Pamella Pert, MD;  Location: [facility] ENDOSCOPY;  Service: Cardiovascular;  Laterality: N/A;  . Cardiac catheterization  2004  . Colonoscopy    . Thryoid biopsy    . Tonsillectomy and adenoidectomy  04/01/2014  . Tonsillectomy and adenoidectomy Bilateral 04/01/2014    Procedure: BILATERAL TONSILLECTOMY AND ADENOIDECTOMY;  Surgeon: Darletta Moll, MD;  Location: [facility] OR;  Service: ENT;  Laterality: Bilateral;  . Total knee arthroplasty Right 11/03/2015  . Total knee arthroplasty Right 11/03/2015    Procedure: RIGHT TOTAL KNEE  ARTHROPLASTY/RIGHT;  Surgeon: Salvatore Marvel, MD;  Location: [facility] OR;  Service: Orthopedics;  Laterality: Right;  . Hernia repair  2006    x2  . Esophagogastroduodenoscopy    . Total knee arthroplasty Left 02/07/2016    Procedure: TOTAL KNEE ARTHROPLASTY;  Surgeon: Salvatore Marvel, MD;  Location: [facility] OR;  Service: Orthopedics;  Laterality: Left;    There were no vitals filed for this visit.      Subjective Assessment - 03/02/16 1349    Subjective Pt states she is working on her HEP consistently at home. She reports cramps at night in her calf taht she is able to alleviate some with massage. She is currently experiencing 3/10 pain.   Patient is accompained by: Family member   Pertinent History CVA, DJD, RA, chest pain, and seizures    Patient Stated Goals Pt's main goal is to improve her strength, improve her general mobility, and return to leisure activities including hiking and swimming   Currently in Pain? Yes   Pain Score 3    Pain Location Knee   Pain Orientation Left   Pain Descriptors / Indicators Aching;Sore   Pain Type Surgical pain   Pain Radiating Towards none    Pain Onset More than a month ago   Pain Frequency Constant   Aggravating Factors  standing/sitting too long   Pain Relieving Factors movement, elevation   Effect of Pain on Daily Activities ADLs, other activity   Multiple Pain Sites No            [facility] PT Assessment - 03/02/16 0001    PROM   Left Knee Extension 3   Left Knee Flexion 110                     [facility] Adult PT Treatment/Exercise - 03/02/16 0001    Knee/Hip Exercises: Stretches   Gastroc Stretch Left;3 reps;30 seconds   Gastroc Stretch Limitations block and against wall   Knee/Hip Exercises: Aerobic   Stationary Bike Seat 11, L1 x79min at end of session   Knee/Hip Exercises: Supine   Heel Slides 10 reps;Left   Straight Leg Raises Left;2 sets;10 reps   Knee/Hip Exercises: Prone   Hip Extension Both;2 sets;5 reps   Hip Extension  Limitations with contralateral    Manual Therapy   Manual Therapy Passive ROM   Manual therapy comments Performed separately from all other interventions   Soft tissue mobilization trigger point release proximal lateral gastroc/popliteus   Passive ROM  terminal knee extension 10x10 sec hold                PT Education - 03/02/16 1615    Education provided Yes   Education Details discussed warning signs of DVT including redness, warmth and swelling; updated HEP with gastroc stretch; discussed progress made so far   Person(s) Educated Patient   Methods Explanation;Demonstration   Comprehension Verbalized understanding;Returned demonstration          PT Short Term Goals - 02/25/16 1024    PT SHORT TERM GOAL #1   Title After 2 weeks patient will be independent with TKA protocol HEP in order to further progress with ROM and strength at home.   PT SHORT TERM GOAL #2   Title After 4 weeks, patient will report L Knee pain < 4/10 on a VAS 75% of time in order to improve tolerance with ther ex and ambulation.    PT SHORT TERM GOAL #3   Title After 4 weeks patient will demo improved L knee AAROM to 5-110 degrees in order to improve L knee mobility during funcitonal activities.            PT Long Term Goals - 02/25/16 1025    PT LONG TERM GOAL #1   Title After 8 weeks, patient will report reduced L knee pain to <3/10 on a VAS in order to improve tolerance with ther ex and ambulation.    PT LONG TERM GOAL #2   Title After 8 weeks, patient will demo improved L knee AAROM to 0-120 degrees in order to improve stair climbing and leisure activities.    PT LONG TERM GOAL #3   Title After 8 weeks, patient will demo improved L quad and HS strength to >4/5 MMT in order to improve performance with outdoor ambulation and transfers.    PT LONG TERM GOAL #4   Title After 8 weks, pt will score <11s on TUG Test to semondtrat eimproved funcitonal strength and activity tolerance to improve  safety at home.                Plan - 03/02/16 1617    Clinical Impression Statement Pt is making good progress towards her goals with noted improvement in Lt knee ROM this visit to 3-11 degrees. She continues to regularly perform her HEP without any issues. She tolerated all progressions and new additions to therex without too much difficulty, however therapist noting decreased hip flexor length which is limiting gluteal activation with activity. She tolerated manual treatment well without increase in pain by the end of the session. Will continue with current POC.   Rehab Potential Good   PT Frequency 3x / week   PT Duration 8 weeks   PT Treatment/Interventions ADLs/Self Care Home Management;Cryotherapy;Electrical Stimulation;Moist Heat;Balance training;Therapeutic exercise;Therapeutic activities;Functional mobility training;Stair training;Gait training;DME Instruction;Neuromuscular re-education;Patient/family education;Manual techniques;Taping;Passive range of motion;Scar mobilization   PT Next Visit Plan addition of more hip strengthening; progress LE strengthening with resistance; gait training    PT Home Exercise Plan Issued: Heel slides, quad sets, SLR, LAQ, bridging, and Abd heel slides; udpated with knee extension ROM hold; updated 03/02/16: gastroc stretch   Consulted and Agree with Plan of Care Patient      Patient will benefit from skilled therapeutic intervention in order to improve the following deficits and impairments:  Abnormal gait, Decreased range of motion, Difficulty walking, Pain, Decreased balance, Hypomobility, Impaired flexibility, Increased edema, Decreased strength, Decreased mobility, Decreased skin integrity, Decreased activity tolerance, Decreased  scar mobility  Visit Diagnosis: Pain in left knee  Stiffness of left knee, not elsewhere classified  Difficulty in walking, not elsewhere classified     Problem List Patient Active Problem List   Diagnosis  Date Noted  . Postoperative anemia due to acute blood loss 02/08/2016  . Primary localized osteoarthritis of left knee   . Primary localized osteoarthritis of right knee 11/03/2015  . DJD (degenerative joint disease) of knee 11/03/2015  . Rheumatoid arthritis (HCC)   . PONV (postoperative nausea and vomiting)   . Seizures (HCC)   . Fibromyalgia   . Sleep apnea   . Ventricular septal defect   . History of staph infection   . Refractory migraine 08/10/2014  . Complex partial epilepsy (HCC) 08/10/2014  . S/P tonsillectomy and adenoidectomy 04/01/2014  . Panic anxiety syndrome 06/03/2013  . Positive depression screening 06/03/2013  . Headache(784.0) 06/03/2013  . Unspecified cerebral artery occlusion with cerebral infarction 06/03/2013  . Headache 05/29/2013  . Patent foramen ovale 05/29/2013  . Chest pain 05/29/2013  . Other convulsions 05/29/2013  . Dysphagia, unspecified(787.20) 04/17/2013  . Acute bronchitis 03/27/2013  . Persistent headaches 03/26/2013  . CVA (cerebral infarction) history 03/26/2013  . Hypokalemia 03/26/2013  . Leukocytosis 03/26/2013  . Arthritis 03/26/2013  . QT prolongation 03/26/2013  . Stroke (HCC) 03/26/2013  . Recurrent ventral incisional hernia 04/19/2012  . Obstructive sleep apnea 08/22/2010  . RESTLESS LEGS SYNDROME 08/22/2010  . Moderate intermittent asthma without complication 08/22/2010  . ECZEMA 08/22/2010  . Irritable bowel syndrome 06/23/2010  . DIARRHEA 06/23/2010  . NAUSEA WITH VOMITING 11/01/2009  . ABDOMINAL PAIN, LOWER 11/01/2009  . KNEE, ARTHRITIS, DEGEN./OSTEO 11/18/2008  . DERANGEMENT MENISCUS 11/02/2008  . JOINT EFFUSION, KNEE 11/02/2008  . KNEE PAIN 11/02/2008   4:29 PM,03/02/2016 Marylyn Ishihara PT, DPT [facility] [phone]  [facility] [address] [place], [place], [address] Phone: [phone]   Fax:  [phone]  Name: [name] MRN:  [id number] Date of Birth: [birth year]

## 2016-03-03 ENCOUNTER — Ambulatory Visit (HOSPITAL_COMMUNITY): Payer: Medicare Other | Admitting: Physical Therapy

## 2016-03-03 ENCOUNTER — Telehealth (HOSPITAL_COMMUNITY): Payer: Self-pay

## 2016-03-03 NOTE — Telephone Encounter (Signed)
03/03/16 cx - unable to find a ride

## 2016-03-06 ENCOUNTER — Encounter (HOSPITAL_COMMUNITY): Payer: Self-pay | Admitting: Physical Therapy

## 2016-03-06 ENCOUNTER — Ambulatory Visit (HOSPITAL_COMMUNITY): Payer: Medicare Other | Admitting: Physical Therapy

## 2016-03-06 DIAGNOSIS — M6281 Muscle weakness (generalized): Secondary | ICD-10-CM

## 2016-03-06 DIAGNOSIS — M25662 Stiffness of left knee, not elsewhere classified: Secondary | ICD-10-CM

## 2016-03-06 DIAGNOSIS — R262 Difficulty in walking, not elsewhere classified: Secondary | ICD-10-CM

## 2016-03-06 DIAGNOSIS — M25562 Pain in left knee: Secondary | ICD-10-CM | POA: Diagnosis not present

## 2016-03-06 NOTE — Therapy (Signed)
[place] [facility] [address] [place], [place], [address] Phone: [phone]   Fax:  [phone]  Physical Therapy Treatment  Patient Details  Name: [name] MRN: [id number] Date of Birth: 14-Jul-1961 Referring Provider: Dr. Salvatore Marvel   Encounter Date: 03/06/2016      PT End of Session - 03/06/16 1358    Visit Number 4   Number of Visits 24   Date for PT Re-Evaluation 03/26/16   Authorization Type Medicare/ BCBS   Authorization Time Period 02/24/2016-04/25/2016   Authorization - Visit Number 4   Authorization - Number of Visits 24   PT Start Time 1301   PT Stop Time 1345   PT Time Calculation (min) 44 min   Activity Tolerance Patient tolerated treatment well   Behavior During Therapy [facility] for tasks assessed/performed      Past Medical History  Diagnosis Date  . IBS (irritable bowel syndrome)     mixed  . Allergic rhinitis     uses Flonase daily as needed and takes CLaritin daily  . Eczema   . Plaque psoriasis   . Cerebral vascular malformation     sees dr Lovell Sheehan for monitoring as needed, sees dr lewitt for headaches every 4 months  . Lactose intolerance   . Anxiety     takes Xanax daily as needed  . Asthma     Albuterol inhaler prn;SYmbicort daily  . Depression     takes Citalopram daily  . Rheumatoid arthritis(714.0) 2010    oa and ra;Rhemicade IV every 6wks and Metotrexate weekly  . Nausea     takes Zofran daily as needed  . GERD (gastroesophageal reflux disease)     takes Protonix daily  . Hyperlipidemia     takes Pravastatin daily  . PONV (postoperative nausea and vomiting)   . Shortness of breath     with exertion  . Pneumonia 82yrs ago    hx of  . History of bronchitis as a child   . History of migraine     last one 10+yrs ago  . Stroke (HCC) 03/26/2013    left sided weakness  . Joint pain   . Joint swelling   . Fibromyalgia   . Diverticulitis at age 52  . History of kidney stones   . Thyroid  cyst   . Insomnia   . History of staph infection 27yrs ago  . Ventricular septal defect     2014 TEE lists large right to left shunt ASD/PFO (NO VSD mentioned)  . Sleep apnea     study done >64yrs ago;uses CPAP nightly  . PFO (patent foramen ovale)   . Primary localized osteoarthritis of right knee 11/03/2015  . Primary localized osteoarthritis of left knee   . Spinal headache     patient states that she thinks she had a spinal headache a long time ago  . Seizures (HCC) 6months ago 03/21/14    takes Depakote daily  . COPD (chronic obstructive pulmonary disease) (HCC)   . Stress incontinence   . Hard of hearing   . Postoperative anemia due to acute blood loss 02/08/2016    Past Surgical History  Procedure Laterality Date  . Incontinence surgery  2010    sling done   . Knee arthroscopy  1992    left  . Foot surgery  1999    right ankle  . Kidney stone surgery  2001  . Appendectomy  1981  . Cholecystectomy  1981  .  Tubal ligation  1990  . Total abdominal hysterectomy  2001  . Left foot plating and scarping for arthritis  2011  . Right ear tube insertion  2011  . Incisional hernia repair  05/20/2012    Procedure: HERNIA REPAIR INCISIONAL;  Surgeon: Clovis Pu. Cornett, MD;  Location: [facility] ORS;  Service: General;  Laterality: N/A;  . Tee without cardioversion N/A 05/15/2013    Procedure: TRANSESOPHAGEAL ECHOCARDIOGRAM (TEE);  Surgeon: Pamella Pert, MD;  Location: [facility] ENDOSCOPY;  Service: Cardiovascular;  Laterality: N/A;  . Cardiac catheterization  2004  . Colonoscopy    . Thryoid biopsy    . Tonsillectomy and adenoidectomy  04/01/2014  . Tonsillectomy and adenoidectomy Bilateral 04/01/2014    Procedure: BILATERAL TONSILLECTOMY AND ADENOIDECTOMY;  Surgeon: Darletta Moll, MD;  Location[facility] OR;  Service: ENT;  Laterality: Bilateral;  . Total knee arthroplasty Right 11/03/2015  . Total knee arthroplasty Right 11/03/2015    Procedure: RIGHT TOTAL KNEE ARTHROPLASTY/RIGHT;  Surgeon: Salvatore Marvel, MD;  Location: [facility] OR;  Service: Orthopedics;  Laterality: Right;  . Hernia repair  2006    x2  . Esophagogastroduodenoscopy    . Total knee arthroplasty Left 02/07/2016    Procedure: TOTAL KNEE ARTHROPLASTY;  Surgeon: Salvatore Marvel, MD;  Location: [facility] OR;  Service: Orthopedics;  Laterality: Left;    There were no vitals filed for this visit.      Subjective Assessment - 03/06/16 1304    Subjective Pt states she is doing good today. She continues to have cramps in her calf and it seems to really bother her at night. No other issues.    Pertinent History CVA, DJD, RA, chest pain, and seizures    Patient Stated Goals Pt's main goal is to improve her strength, improve her general mobility, and return to leisure activities including hiking and swimming   Currently in Pain? Yes   Pain Score 2    Pain Location Knee   Pain Orientation Left   Pain Descriptors / Indicators Aching   Pain Type Surgical pain   Pain Onset More than a month ago                         [facility] Adult PT Treatment/Exercise - 03/06/16 0001    Ambulation/Gait   Ambulation/Gait Yes   Ambulation/Gait Assistance 6: Modified independent (Device/Increase time)   Ambulation Distance (Feet) 680 Feet   Assistive device None   Gait Comments verbal/visual cues for increased Lt knee flexion, heel initial contact and increased step length   Knee/Hip Exercises: Seated   Long Arc Quad Left;1 set;15 reps   Long Arc Quad Limitations Green TB   Heel Slides Left;1 set;10 reps   Heel Slides Limitations Green TB   Knee/Hip Exercises: Supine   Bridges with Clamshell Both;2 sets;10 reps   Manual Therapy   Manual Therapy Joint mobilization;Soft tissue mobilization   Manual therapy comments Performed separately from all other interventions   Joint Mobilization grade I-III Superior Lt patellar mobs   Soft tissue mobilization scar massage                 PT Education - 03/06/16 1354    Education  provided Yes   Education Details updated HEP; gait training; discussed signs and symptoms to watch for and pt instructed to call referring MD for any concerns of DVT   Person(s) Educated Patient   Methods Explanation;Demonstration;Handout   Comprehension Returned demonstration;Verbalized understanding;Verbal cues required;Need  further instruction          PT Short Term Goals - 02/25/16 1024    PT SHORT TERM GOAL #1   Title After 2 weeks patient will be independent with TKA protocol HEP in order to further progress with ROM and strength at home.   PT SHORT TERM GOAL #2   Title After 4 weeks, patient will report L Knee pain < 4/10 on a VAS 75% of time in order to improve tolerance with ther ex and ambulation.    PT SHORT TERM GOAL #3   Title After 4 weeks patient will demo improved L knee AAROM to 5-110 degrees in order to improve L knee mobility during funcitonal activities.            PT Long Term Goals - 02/25/16 1025    PT LONG TERM GOAL #1   Title After 8 weeks, patient will report reduced L knee pain to <3/10 on a VAS in order to improve tolerance with ther ex and ambulation.    PT LONG TERM GOAL #2   Title After 8 weeks, patient will demo improved L knee AAROM to 0-120 degrees in order to improve stair climbing and leisure activities.    PT LONG TERM GOAL #3   Title After 8 weeks, patient will demo improved L quad and HS strength to >4/5 MMT in order to improve performance with outdoor ambulation and transfers.    PT LONG TERM GOAL #4   Title After 8 weks, pt will score <11s on TUG Test to semondtrat eimproved funcitonal strength and activity tolerance to improve safety at home.                Plan - 03/06/16 1358    Clinical Impression Statement Pt's strength continues to improve with good tolerance to increased reps/resistance without report of pain. Noting pt continues to walk with antalgic pattern throughout her sessions and therapist reviewed correct technique,  providing moderate verbal cues to improved heel contact/knee flexion during ambulation. Pt demonstrated improved pattern by the end of today's session, however she will need further cues to address consistency. Will continue with current POC.   Rehab Potential Good   PT Frequency 3x / week   PT Duration 8 weeks   PT Treatment/Interventions ADLs/Self Care Home Management;Cryotherapy;Electrical Stimulation;Moist Heat;Balance training;Therapeutic exercise;Therapeutic activities;Functional mobility training;Stair training;Gait training;DME Instruction;Neuromuscular re-education;Patient/family education;Manual techniques;Taping;Passive range of motion;Scar mobilization   PT Next Visit Plan addition of more hip strengthening; progress LE strengthening with resistance; gait training    PT Home Exercise Plan Issued: Heel slides, quad sets, SLR, LAQ, bridging, and Abd heel slides; udpated with knee extension ROM hold; updated 03/02/16: gastroc stretch, updated 03/06/16: knee ext/flex with green TB, bridge, walking    Recommended Other Services None   Consulted and Agree with Plan of Care Patient      Patient will benefit from skilled therapeutic intervention in order to improve the following deficits and impairments:  Abnormal gait, Decreased range of motion, Difficulty walking, Pain, Decreased balance, Hypomobility, Impaired flexibility, Increased edema, Decreased strength, Decreased mobility, Decreased skin integrity, Decreased activity tolerance, Decreased scar mobility  Visit Diagnosis: Pain in left knee  Stiffness of left knee, not elsewhere classified  Difficulty in walking, not elsewhere classified  Muscle weakness     Problem List Patient Active Problem List   Diagnosis Date Noted  . Postoperative anemia due to acute blood loss 02/08/2016  . Primary localized osteoarthritis of left knee   . Primary  localized osteoarthritis of right knee 11/03/2015  . DJD (degenerative joint disease) of  knee 11/03/2015  . Rheumatoid arthritis (HCC)   . PONV (postoperative nausea and vomiting)   . Seizures (HCC)   . Fibromyalgia   . Sleep apnea   . Ventricular septal defect   . History of staph infection   . Refractory migraine 08/10/2014  . Complex partial epilepsy (HCC) 08/10/2014  . S/P tonsillectomy and adenoidectomy 04/01/2014  . Panic anxiety syndrome 06/03/2013  . Positive depression screening 06/03/2013  . Headache(784.0) 06/03/2013  . Unspecified cerebral artery occlusion with cerebral infarction 06/03/2013  . Headache 05/29/2013  . Patent foramen ovale 05/29/2013  . Chest pain 05/29/2013  . Other convulsions 05/29/2013  . Dysphagia, unspecified(787.20) 04/17/2013  . Acute bronchitis 03/27/2013  . Persistent headaches 03/26/2013  . CVA (cerebral infarction) history 03/26/2013  . Hypokalemia 03/26/2013  . Leukocytosis 03/26/2013  . Arthritis 03/26/2013  . QT prolongation 03/26/2013  . Stroke (HCC) 03/26/2013  . Recurrent ventral incisional hernia 04/19/2012  . Obstructive sleep apnea 08/22/2010  . RESTLESS LEGS SYNDROME 08/22/2010  . Moderate intermittent asthma without complication 08/22/2010  . ECZEMA 08/22/2010  . Irritable bowel syndrome 06/23/2010  . DIARRHEA 06/23/2010  . NAUSEA WITH VOMITING 11/01/2009  . ABDOMINAL PAIN, LOWER 11/01/2009  . KNEE, ARTHRITIS, DEGEN./OSTEO 11/18/2008  . DERANGEMENT MENISCUS 11/02/2008  . JOINT EFFUSION, KNEE 11/02/2008  . KNEE PAIN 11/02/2008    2:03 PM,03/06/2016 [name] PT, DPT [name] [phone]  [facility] [address] [place], [place], [address] Phone: [phone]   Fax:  [phone]  Name: [name] MRN: [id number] Date of Birth: 12/16/1960

## 2016-03-07 ENCOUNTER — Ambulatory Visit (HOSPITAL_COMMUNITY): Payer: Medicare Other

## 2016-03-07 DIAGNOSIS — M25661 Stiffness of right knee, not elsewhere classified: Secondary | ICD-10-CM

## 2016-03-07 DIAGNOSIS — M25561 Pain in right knee: Secondary | ICD-10-CM

## 2016-03-07 DIAGNOSIS — M6281 Muscle weakness (generalized): Secondary | ICD-10-CM

## 2016-03-07 DIAGNOSIS — R269 Unspecified abnormalities of gait and mobility: Secondary | ICD-10-CM

## 2016-03-07 DIAGNOSIS — M25562 Pain in left knee: Secondary | ICD-10-CM

## 2016-03-07 DIAGNOSIS — M25662 Stiffness of left knee, not elsewhere classified: Secondary | ICD-10-CM

## 2016-03-07 DIAGNOSIS — Z96651 Presence of right artificial knee joint: Secondary | ICD-10-CM

## 2016-03-07 DIAGNOSIS — R262 Difficulty in walking, not elsewhere classified: Secondary | ICD-10-CM

## 2016-03-07 NOTE — Therapy (Signed)
[facility] [address] [place], [place], [address] Phone: [phone]   Fax:  [phone]  Physical Therapy Treatment  Patient Details  Name: [name] MRN: [id number] Date of Birth: 1961/08/26 Referring Provider: Dr. Salvatore Marvel   Encounter Date: 03/07/2016      PT End of Session - 03/07/16 1344    Visit Number 5   Number of Visits 24   Date for PT Re-Evaluation 03/26/16   Authorization Type Medicare/ BCBS   Authorization Time Period 02/24/2016-04/25/2016   Authorization - Visit Number 5   Authorization - Number of Visits 24   PT Start Time 1302   PT Stop Time 1346   PT Time Calculation (min) 44 min   Activity Tolerance Patient tolerated treatment well   Behavior During Therapy [facility] for tasks assessed/performed      Past Medical History  Diagnosis Date  . IBS (irritable bowel syndrome)     mixed  . Allergic rhinitis     uses Flonase daily as needed and takes CLaritin daily  . Eczema   . Plaque psoriasis   . Cerebral vascular malformation     sees dr Lovell Sheehan for monitoring as needed, sees dr lewitt for headaches every 4 months  . Lactose intolerance   . Anxiety     takes Xanax daily as needed  . Asthma     Albuterol inhaler prn;SYmbicort daily  . Depression     takes Citalopram daily  . Rheumatoid arthritis(714.0) 2010    oa and ra;Rhemicade IV every 6wks and Metotrexate weekly  . Nausea     takes Zofran daily as needed  . GERD (gastroesophageal reflux disease)     takes Protonix daily  . Hyperlipidemia     takes Pravastatin daily  . PONV (postoperative nausea and vomiting)   . Shortness of breath     with exertion  . Pneumonia 75yrs ago    hx of  . History of bronchitis as a child   . History of migraine     last one 10+yrs ago  . Stroke (HCC) 03/26/2013    left sided weakness  . Joint pain   . Joint swelling   . Fibromyalgia   . Diverticulitis at age 28  . History of kidney stones   . Thyroid  cyst   . Insomnia   . History of staph infection 28yrs ago  . Ventricular septal defect     2014 TEE lists large right to left shunt ASD/PFO (NO VSD mentioned)  . Sleep apnea     study done >24yrs ago;uses CPAP nightly  . PFO (patent foramen ovale)   . Primary localized osteoarthritis of right knee 11/03/2015  . Primary localized osteoarthritis of left knee   . Spinal headache     patient states that she thinks she had a spinal headache a long time ago  . Seizures (HCC) 19months ago 03/21/14    takes Depakote daily  . COPD (chronic obstructive pulmonary disease) (HCC)   . Stress incontinence   . Hard of hearing   . Postoperative anemia due to acute blood loss 02/08/2016    Past Surgical History  Procedure Laterality Date  . Incontinence surgery  2010    sling done   . Knee arthroscopy  1992    left  . Foot surgery  1999    right ankle  . Kidney stone surgery  2001  . Appendectomy  1981  . Cholecystectomy  1981  .  Tubal ligation  1990  . Total abdominal hysterectomy  2001  . Left foot plating and scarping for arthritis  2011  . Right ear tube insertion  2011  . Incisional hernia repair  05/20/2012    Procedure: HERNIA REPAIR INCISIONAL;  Surgeon: Clovis Pu. Cornett, MD;  Location: [facility] ORS;  Service: General;  Laterality: N/A;  . Tee without cardioversion N/A 05/15/2013    Procedure: TRANSESOPHAGEAL ECHOCARDIOGRAM (TEE);  Surgeon: Pamella Pert, MD;  Location: [facility] ENDOSCOPY;  Service: Cardiovascular;  Laterality: N/A;  . Cardiac catheterization  2004  . Colonoscopy    . Thryoid biopsy    . Tonsillectomy and adenoidectomy  04/01/2014  . Tonsillectomy and adenoidectomy Bilateral 04/01/2014    Procedure: BILATERAL TONSILLECTOMY AND ADENOIDECTOMY;  Surgeon: Darletta Moll, MD;  Location: [facility] OR;  Service: ENT;  Laterality: Bilateral;  . Total knee arthroplasty Right 11/03/2015  . Total knee arthroplasty Right 11/03/2015    Procedure: RIGHT TOTAL KNEE ARTHROPLASTY/RIGHT;  Surgeon: Salvatore Marvel, MD;  Location: [facility] OR;  Service: Orthopedics;  Laterality: Right;  . Hernia repair  2006    x2  . Esophagogastroduodenoscopy    . Total knee arthroplasty Left 02/07/2016    Procedure: TOTAL KNEE ARTHROPLASTY;  Surgeon: Salvatore Marvel, MD;  Location: [facility] OR;  Service: Orthopedics;  Laterality: Left;    There were no vitals filed for this visit.      Subjective Assessment - 03/07/16 1306    Subjective Pt had PT session yesterday and is still sore today. She still has some cramping and pain in left medial compartment, mostly at night, but its getting better.    Pertinent History CVA, DJD, RA, chest pain, and seizures    Limitations Standing;Walking   Currently in Pain? Yes   Pain Score 1    Pain Location Knee   Pain Orientation Left   Pain Descriptors / Indicators Aching   Pain Type Surgical pain                         [facility] Adult PT Treatment/Exercise - 03/07/16 0001    Ambulation/Gait   Ambulation/Gait Assistance 4: Min assist   Ambulation Distance (Feet) 250 Feet   Assistive device None   Gait Comments First lap looked equal in step length, with moderate LLE abduction, facilitated by guarded weight bearing.   Practiced twp addition short bouts of tandem gait with minA    Knee/Hip Exercises: Standing   Heel Raises Both;20 reps;1 set   Step Down 2 sets  2x8, anterior step (2") down, with BUE supoort. Left only   Knee/Hip Exercises: Seated   Long Arc Quad Left;2 sets;10 reps  4# weight "feels good"    Knee/Hip Exercises: Supine   Quad Sets 1 set;15 reps  1x15x3sec   Heel Slides Left;15 reps  3-5s hold, over pressure c rope   Bridges Limitations hamstring bridges   2x10    Bridges with Clamshell Both;2 sets;10 reps                PT Education - 03/07/16 1343    Education provided Yes   Education Details asked to try compression hose for night to assist with upper gastroc fluid accumulation, and try a gallon bag with coolwater for swelling,  pain management.    Person(s) Educated Patient   Methods Explanation;Demonstration   Comprehension Verbalized understanding;Returned demonstration;Verbal cues required          PT Short Term Goals -  02/25/16 1024    PT SHORT TERM GOAL #1   Title After 2 weeks patient will be independent with TKA protocol HEP in order to further progress with ROM and strength at home.   PT SHORT TERM GOAL #2   Title After 4 weeks, patient will report L Knee pain < 4/10 on a VAS 75% of time in order to improve tolerance with ther ex and ambulation.    PT SHORT TERM GOAL #3   Title After 4 weeks patient will demo improved L knee AAROM to 5-110 degrees in order to improve L knee mobility during funcitonal activities.            PT Long Term Goals - 02/25/16 1025    PT LONG TERM GOAL #1   Title After 8 weeks, patient will report reduced L knee pain to <3/10 on a VAS in order to improve tolerance with ther ex and ambulation.    PT LONG TERM GOAL #2   Title After 8 weeks, patient will demo improved L knee AAROM to 0-120 degrees in order to improve stair climbing and leisure activities.    PT LONG TERM GOAL #3   Title After 8 weeks, patient will demo improved L quad and HS strength to >4/5 MMT in order to improve performance with outdoor ambulation and transfers.    PT LONG TERM GOAL #4   Title After 8 weks, pt will score <11s on TUG Test to semondtrat eimproved funcitonal strength and activity tolerance to improve safety at home.                Plan - 03/07/16 1354    Clinical Impression Statement Pt doing well today, makig progress toward all goals, and tolerating advancment of HEP. Patellar mobility is close to WNL med/lat and caudal/cranial. Additional loading of quads reported to feel good by patient. Noted additional fluid accumulation behind knee, localized bogginess, recommieding patient to return to compression hose, especially at night to see if some pain and swelling reduction can be  achieved. Left knee flexion range assessed today 10-112* in supine.    Rehab Potential Good   PT Frequency 3x / week   PT Duration 8 weeks   PT Treatment/Interventions ADLs/Self Care Home Management;Cryotherapy;Electrical Stimulation;Moist Heat;Balance training;Therapeutic exercise;Therapeutic activities;Functional mobility training;Stair training;Gait training;DME Instruction;Neuromuscular re-education;Patient/family education;Manual techniques;Taping;Passive range of motion;Scar mobilization   PT Next Visit Plan addition of more hip strengthening; progress LE strengthening with resistance; gait training    PT Home Exercise Plan Offered patient to transition SAQ at home to standing anteriro step down on 2" step.    Consulted and Agree with Plan of Care Patient      Patient will benefit from skilled therapeutic intervention in order to improve the following deficits and impairments:  Abnormal gait, Decreased range of motion, Difficulty walking, Pain, Decreased balance, Hypomobility, Impaired flexibility, Increased edema, Decreased strength, Decreased mobility, Decreased skin integrity, Decreased activity tolerance, Decreased scar mobility  Visit Diagnosis: Pain in left knee  Stiffness of left knee, not elsewhere classified  Difficulty in walking, not elsewhere classified  Muscle weakness  Pain in joint of right knee  Abnormality of gait  Stiffness of knee joint, right  Status post total right knee replacement     Problem List Patient Active Problem List   Diagnosis Date Noted  . Postoperative anemia due to acute blood loss 02/08/2016  . Primary localized osteoarthritis of left knee   . Primary localized osteoarthritis of right knee 11/03/2015  .  DJD (degenerative joint disease) of knee 11/03/2015  . Rheumatoid arthritis (HCC)   . PONV (postoperative nausea and vomiting)   . Seizures (HCC)   . Fibromyalgia   . Sleep apnea   . Ventricular septal defect   . History of staph  infection   . Refractory migraine 08/10/2014  . Complex partial epilepsy (HCC) 08/10/2014  . S/P tonsillectomy and adenoidectomy 04/01/2014  . Panic anxiety syndrome 06/03/2013  . Positive depression screening 06/03/2013  . Headache(784.0) 06/03/2013  . Unspecified cerebral artery occlusion with cerebral infarction 06/03/2013  . Headache 05/29/2013  . Patent foramen ovale 05/29/2013  . Chest pain 05/29/2013  . Other convulsions 05/29/2013  . Dysphagia, unspecified(787.20) 04/17/2013  . Acute bronchitis 03/27/2013  . Persistent headaches 03/26/2013  . CVA (cerebral infarction) history 03/26/2013  . Hypokalemia 03/26/2013  . Leukocytosis 03/26/2013  . Arthritis 03/26/2013  . QT prolongation 03/26/2013  . Stroke (HCC) 03/26/2013  . Recurrent ventral incisional hernia 04/19/2012  . Obstructive sleep apnea 08/22/2010  . RESTLESS LEGS SYNDROME 08/22/2010  . Moderate intermittent asthma without complication 08/22/2010  . ECZEMA 08/22/2010  . Irritable bowel syndrome 06/23/2010  . DIARRHEA 06/23/2010  . NAUSEA WITH VOMITING 11/01/2009  . ABDOMINAL PAIN, LOWER 11/01/2009  . KNEE, ARTHRITIS, DEGEN./OSTEO 11/18/2008  . DERANGEMENT MENISCUS 11/02/2008  . JOINT EFFUSION, KNEE 11/02/2008  . KNEE PAIN 11/02/2008   2:02 PM, 03/07/2016 Rosamaria Lints, PT, DPT PRN Physical Therapist at [facility] [place] License # [id number] [phone] (office)      [facility] [address] [place], [place], [address] Phone: [phone]   Fax:  [phone]  Name: [name] MRN: [id number] Date of Birth: Mar 12, 1961

## 2016-03-10 ENCOUNTER — Ambulatory Visit (HOSPITAL_COMMUNITY): Payer: Medicare Other | Admitting: Physical Therapy

## 2016-03-10 DIAGNOSIS — M25562 Pain in left knee: Secondary | ICD-10-CM | POA: Diagnosis not present

## 2016-03-10 DIAGNOSIS — R262 Difficulty in walking, not elsewhere classified: Secondary | ICD-10-CM

## 2016-03-10 DIAGNOSIS — M25662 Stiffness of left knee, not elsewhere classified: Secondary | ICD-10-CM

## 2016-03-10 DIAGNOSIS — M6281 Muscle weakness (generalized): Secondary | ICD-10-CM

## 2016-03-10 NOTE — Therapy (Signed)
[facility] [address] [place], [place], [address] Phone: [phone]   Fax:  [phone]  Physical Therapy Treatment  Patient Details  Name: [name] MRN: [id number] Date of Birth: 09-Nov-1960 Referring Provider: Dr. Salvatore Marvel   Encounter Date: 03/10/2016      PT End of Session - 03/10/16 1504    Visit Number 6   Number of Visits 24   Authorization Type Medicare/ BCBS   Authorization - Visit Number 6   Authorization - Number of Visits 24   PT Start Time 1430   PT Stop Time 1515   PT Time Calculation (min) 45 min      Past Medical History  Diagnosis Date  . IBS (irritable bowel syndrome)     mixed  . Allergic rhinitis     uses Flonase daily as needed and takes CLaritin daily  . Eczema   . Plaque psoriasis   . Cerebral vascular malformation     sees dr Lovell Sheehan for monitoring as needed, sees dr lewitt for headaches every 4 months  . Lactose intolerance   . Anxiety     takes Xanax daily as needed  . Asthma     Albuterol inhaler prn;SYmbicort daily  . Depression     takes Citalopram daily  . Rheumatoid arthritis(714.0) 2010    oa and ra;Rhemicade IV every 6wks and Metotrexate weekly  . Nausea     takes Zofran daily as needed  . GERD (gastroesophageal reflux disease)     takes Protonix daily  . Hyperlipidemia     takes Pravastatin daily  . PONV (postoperative nausea and vomiting)   . Shortness of breath     with exertion  . Pneumonia 16yrs ago    hx of  . History of bronchitis as a child   . History of migraine     last one 10+yrs ago  . Stroke (HCC) 03/26/2013    left sided weakness  . Joint pain   . Joint swelling   . Fibromyalgia   . Diverticulitis at age 74  . History of kidney stones   . Thyroid cyst   . Insomnia   . History of staph infection 37yrs ago  . Ventricular septal defect     2014 TEE lists large right to left shunt ASD/PFO (NO VSD mentioned)  . Sleep apnea     study done >55yrs  ago;uses CPAP nightly  . PFO (patent foramen ovale)   . Primary localized osteoarthritis of right knee 11/03/2015  . Primary localized osteoarthritis of left knee   . Spinal headache     patient states that she thinks she had a spinal headache a long time ago  . Seizures (HCC) 53months ago 03/21/14    takes Depakote daily  . COPD (chronic obstructive pulmonary disease) (HCC)   . Stress incontinence   . Hard of hearing   . Postoperative anemia due to acute blood loss 02/08/2016    Past Surgical History  Procedure Laterality Date  . Incontinence surgery  2010    sling done   . Knee arthroscopy  1992    left  . Foot surgery  1999    right ankle  . Kidney stone surgery  2001  . Appendectomy  1981  . Cholecystectomy  1981  . Tubal ligation  1990  . Total abdominal hysterectomy  2001  . Left foot plating and scarping for arthritis  2011  . Right ear tube insertion  2011  .  Incisional hernia repair  05/20/2012    Procedure: HERNIA REPAIR INCISIONAL;  Surgeon: Clovis Pu. Cornett, MD;  Location: [facility] ORS;  Service: General;  Laterality: N/A;  . Tee without cardioversion N/A 05/15/2013    Procedure: TRANSESOPHAGEAL ECHOCARDIOGRAM (TEE);  Surgeon: Pamella Pert, MD;  Location: [facility] ENDOSCOPY;  Service: Cardiovascular;  Laterality: N/A;  . Cardiac catheterization  2004  . Colonoscopy    . Thryoid biopsy    . Tonsillectomy and adenoidectomy  04/01/2014  . Tonsillectomy and adenoidectomy Bilateral 04/01/2014    Procedure: BILATERAL TONSILLECTOMY AND ADENOIDECTOMY;  Surgeon: Darletta Moll, MD;  Location: [facility] OR;  Service: ENT;  Laterality: Bilateral;  . Total knee arthroplasty Right 11/03/2015  . Total knee arthroplasty Right 11/03/2015    Procedure: RIGHT TOTAL KNEE ARTHROPLASTY/RIGHT;  Surgeon: Salvatore Marvel, MD;  Location: [facility] OR;  Service: Orthopedics;  Laterality: Right;  . Hernia repair  2006    x2  . Esophagogastroduodenoscopy    . Total knee arthroplasty Left 02/07/2016    Procedure: TOTAL KNEE  ARTHROPLASTY;  Surgeon: Salvatore Marvel, MD;  Location: [facility] OR;  Service: Orthopedics;  Laterality: Left;    There were no vitals filed for this visit.      Subjective Assessment - 03/10/16 1432    Subjective Pt states that she put her compression gament back on and it has made her knee feel better.    Pertinent History CVA, DJD, RA, chest pain, and seizures    Patient Stated Goals Pt's main goal is to improve her strength, improve her general mobility, and return to leisure activities including hiking and swimming   Currently in Pain? Yes   Pain Score 2    Pain Location Knee   Pain Orientation Left   Pain Descriptors / Indicators Aching   Pain Type Surgical pain   Pain Onset More than a month ago   Pain Frequency Constant   Aggravating Factors  sitting    Pain Relieving Factors elevating                          [facility] Adult PT Treatment/Exercise - 03/10/16 0001    Knee/Hip Exercises: Stretches   Active Hamstring Stretch 2 reps;30 seconds   Knee: Self-Stretch to increase Flexion Left;3 reps;30 seconds   Gastroc Stretch Left;3 reps;30 seconds   Gastroc Stretch Limitations slant board    Knee/Hip Exercises: Standing   Knee Flexion Strengthening;Left;10 reps;Limitations   Knee Flexion Limitations 4#   Terminal Knee Extension Left;10 reps   Lateral Step Up 10 reps;Step Height: 4"   Step Down Left;Step Height: 4";10 reps   Functional Squat 10 reps   Rocker Board 2 minutes   SLS 3 x max 31 seconds   Knee/Hip Exercises: Seated   Long Arc Quad Left;Strengthening;10 reps   Long Arc Quad Weight 4 lbs.   Manual Therapy   Manual Therapy Edema management   Manual therapy comments Patella mobilization   Edema Management decongestive techniques to decrease edema                 PT Education - 03/10/16 1503    Education provided Yes   Education Details HEP   Person(s) Educated Patient   Methods Explanation   Comprehension Verbalized understanding           PT Short Term Goals - 02/25/16 1024    PT SHORT TERM GOAL #1   Title After 2 weeks patient will be independent with TKA  protocol HEP in order to further progress with ROM and strength at home.   PT SHORT TERM GOAL #2   Title After 4 weeks, patient will report L Knee pain < 4/10 on a VAS 75% of time in order to improve tolerance with ther ex and ambulation.    PT SHORT TERM GOAL #3   Title After 4 weeks patient will demo improved L knee AAROM to 5-110 degrees in order to improve L knee mobility during funcitonal activities.            PT Long Term Goals - 02/25/16 1025    PT LONG TERM GOAL #1   Title After 8 weeks, patient will report reduced L knee pain to <3/10 on a VAS in order to improve tolerance with ther ex and ambulation.    PT LONG TERM GOAL #2   Title After 8 weeks, patient will demo improved L knee AAROM to 0-120 degrees in order to improve stair climbing and leisure activities.    PT LONG TERM GOAL #3   Title After 8 weeks, patient will demo improved L quad and HS strength to >4/5 MMT in order to improve performance with outdoor ambulation and transfers.    PT LONG TERM GOAL #4   Title After 8 weks, pt will score <11s on TUG Test to semondtrat eimproved funcitonal strength and activity tolerance to improve safety at home.                Plan - 03/10/16 1515    Clinical Impression Statement Pt continues to make gains with ROM 4-115. Pt advanced to higher level closed chain exercises.  Pt knee continues to be warm and swollen pt does not like to ice.  Therapist emphasised the importance and benefits of icing.    Rehab Potential Good   PT Frequency 3x / week   PT Duration 8 weeks   PT Next Visit Plan begin vector stances       Patient will benefit from skilled therapeutic intervention in order to improve the following deficits and impairments:  Abnormal gait, Decreased range of motion, Difficulty walking, Pain, Decreased balance, Hypomobility, Impaired  flexibility, Increased edema, Decreased strength, Decreased mobility, Decreased skin integrity, Decreased activity tolerance, Decreased scar mobility  Visit Diagnosis: Pain in left knee  Stiffness of left knee, not elsewhere classified  Difficulty in walking, not elsewhere classified  Muscle weakness     Problem List Patient Active Problem List   Diagnosis Date Noted  . Postoperative anemia due to acute blood loss 02/08/2016  . Primary localized osteoarthritis of left knee   . Primary localized osteoarthritis of right knee 11/03/2015  . DJD (degenerative joint disease) of knee 11/03/2015  . Rheumatoid arthritis (HCC)   . PONV (postoperative nausea and vomiting)   . Seizures (HCC)   . Fibromyalgia   . Sleep apnea   . Ventricular septal defect   . History of staph infection   . Refractory migraine 08/10/2014  . Complex partial epilepsy (HCC) 08/10/2014  . S/P tonsillectomy and adenoidectomy 04/01/2014  . Panic anxiety syndrome 06/03/2013  . Positive depression screening 06/03/2013  . Headache(784.0) 06/03/2013  . Unspecified cerebral artery occlusion with cerebral infarction 06/03/2013  . Headache 05/29/2013  . Patent foramen ovale 05/29/2013  . Chest pain 05/29/2013  . Other convulsions 05/29/2013  . Dysphagia, unspecified(787.20) 04/17/2013  . Acute bronchitis 03/27/2013  . Persistent headaches 03/26/2013  . CVA (cerebral infarction) history 03/26/2013  . Hypokalemia 03/26/2013  . Leukocytosis 03/26/2013  .  Arthritis 03/26/2013  . QT prolongation 03/26/2013  . Stroke (HCC) 03/26/2013  . Recurrent ventral incisional hernia 04/19/2012  . Obstructive sleep apnea 08/22/2010  . RESTLESS LEGS SYNDROME 08/22/2010  . Moderate intermittent asthma without complication 08/22/2010  . ECZEMA 08/22/2010  . Irritable bowel syndrome 06/23/2010  . DIARRHEA 06/23/2010  . NAUSEA WITH VOMITING 11/01/2009  . ABDOMINAL PAIN, LOWER 11/01/2009  . KNEE, ARTHRITIS, DEGEN./OSTEO  11/18/2008  . DERANGEMENT MENISCUS 11/02/2008  . JOINT EFFUSION, KNEE 11/02/2008  . KNEE PAIN 11/02/2008   Virgina Organ, PT CLT [phone] 03/10/2016, 3:19 PM  [facility] [address] [place], [place], [address] Phone: [phone]   Fax:  [phone]  Name: [name] MRN: [id number] Date of Birth: 1961-03-10

## 2016-03-10 NOTE — Patient Instructions (Signed)
Balance: Unilateral    Attempt to balance on left leg, eyes open. Hold __30__ seconds. Repeat _3___ times per set. Do ___1_ sets per session. Do _2___ sessions per day. Perform exercise with eyes closed.  http://orth.exer.us/28   Copyright  VHI. All rights reserved.  Heel Raise: Bilateral (Standing)    Rise on balls of feet. Repeat _10___ times per set. Do __1__ sets per session. Do __2__ sessions per day.  http://orth.exer.us/38   Copyright  VHI. All rights reserved.  Functional Quadriceps: Chair Squat    Keeping feet flat on floor, shoulder width apart, squat as low as is comfortable. Use support as necessary. Repeat ___10_ times per set. Do __1__ sets per session. Do __3__ sessions per day.  http://orth.exer.us/736   Copyright  VHI. All rights reserved.  Step-Down / Step-Up    Stand on stair step or _6___ inch stool. Slowly bend left leg, lowering other foot to floor. Return by straightening front leg. Repeat __10__ times per set. Do 1____ sets per session. Do _3___ sessions per day.  http://orth.exer.us/684   Copyright  VHI. All rights reserved.

## 2016-03-14 ENCOUNTER — Ambulatory Visit (HOSPITAL_COMMUNITY): Payer: Medicare Other | Admitting: Physical Therapy

## 2016-03-14 DIAGNOSIS — R262 Difficulty in walking, not elsewhere classified: Secondary | ICD-10-CM

## 2016-03-14 DIAGNOSIS — M25562 Pain in left knee: Secondary | ICD-10-CM | POA: Diagnosis not present

## 2016-03-14 DIAGNOSIS — M25662 Stiffness of left knee, not elsewhere classified: Secondary | ICD-10-CM

## 2016-03-14 NOTE — Therapy (Signed)
[facility] [address] [place], [place], [address] Phone: [phone]   Fax:  [phone]  Physical Therapy Treatment  Patient Details  Name: [name] MRN: [id number] Date of Birth: 12-Jan-1961 Referring Provider: Dr. Salvatore Marvel   Encounter Date: 03/14/2016      PT End of Session - 03/14/16 1616    Visit Number 7   Number of Visits 24   Date for PT Re-Evaluation 03/26/16   Authorization Type Medicare/ BCBS   Authorization Time Period 02/24/2016-04/25/2016   Authorization - Visit Number 7   Authorization - Number of Visits 24   PT Start Time 1352  pt arrived late   PT Stop Time 1430   PT Time Calculation (min) 38 min   Activity Tolerance Patient tolerated treatment well   Behavior During Therapy [facility] for tasks assessed/performed      Past Medical History  Diagnosis Date  . IBS (irritable bowel syndrome)     mixed  . Allergic rhinitis     uses Flonase daily as needed and takes CLaritin daily  . Eczema   . Plaque psoriasis   . Cerebral vascular malformation     sees dr Lovell Sheehan for monitoring as needed, sees dr lewitt for headaches every 4 months  . Lactose intolerance   . Anxiety     takes Xanax daily as needed  . Asthma     Albuterol inhaler prn;SYmbicort daily  . Depression     takes Citalopram daily  . Rheumatoid arthritis(714.0) 2010    oa and ra;Rhemicade IV every 6wks and Metotrexate weekly  . Nausea     takes Zofran daily as needed  . GERD (gastroesophageal reflux disease)     takes Protonix daily  . Hyperlipidemia     takes Pravastatin daily  . PONV (postoperative nausea and vomiting)   . Shortness of breath     with exertion  . Pneumonia 38yrs ago    hx of  . History of bronchitis as a child   . History of migraine     last one 10+yrs ago  . Stroke (HCC) 03/26/2013    left sided weakness  . Joint pain   . Joint swelling   . Fibromyalgia   . Diverticulitis at age 29  . History of kidney  stones   . Thyroid cyst   . Insomnia   . History of staph infection 20yrs ago  . Ventricular septal defect     2014 TEE lists large right to left shunt ASD/PFO (NO VSD mentioned)  . Sleep apnea     study done >49yrs ago;uses CPAP nightly  . PFO (patent foramen ovale)   . Primary localized osteoarthritis of right knee 11/03/2015  . Primary localized osteoarthritis of left knee   . Spinal headache     patient states that she thinks she had a spinal headache a long time ago  . Seizures (HCC) 58months ago 03/21/14    takes Depakote daily  . COPD (chronic obstructive pulmonary disease) (HCC)   . Stress incontinence   . Hard of hearing   . Postoperative anemia due to acute blood loss 02/08/2016    Past Surgical History  Procedure Laterality Date  . Incontinence surgery  2010    sling done   . Knee arthroscopy  1992    left  . Foot surgery  1999    right ankle  . Kidney stone surgery  2001  . Appendectomy  1981  . Cholecystectomy  1981  . Tubal ligation  1990  . Total abdominal hysterectomy  2001  . Left foot plating and scarping for arthritis  2011  . Right ear tube insertion  2011  . Incisional hernia repair  05/20/2012    Procedure: HERNIA REPAIR INCISIONAL;  Surgeon: Clovis Pu. Cornett, MD;  Location: [facility] ORS;  Service: General;  Laterality: N/A;  . Tee without cardioversion N/A 05/15/2013    Procedure: TRANSESOPHAGEAL ECHOCARDIOGRAM (TEE);  Surgeon: Pamella Pert, MD;  Location: [facility] ENDOSCOPY;  Service: Cardiovascular;  Laterality: N/A;  . Cardiac catheterization  2004  . Colonoscopy    . Thryoid biopsy    . Tonsillectomy and adenoidectomy  04/01/2014  . Tonsillectomy and adenoidectomy Bilateral 04/01/2014    Procedure: BILATERAL TONSILLECTOMY AND ADENOIDECTOMY;  Surgeon: Darletta Moll, MD;  Location: [facility] OR;  Service: ENT;  Laterality: Bilateral;  . Total knee arthroplasty Right 11/03/2015  . Total knee arthroplasty Right 11/03/2015    Procedure: RIGHT TOTAL KNEE ARTHROPLASTY/RIGHT;   Surgeon: Salvatore Marvel, MD;  Location: [facility] OR;  Service: Orthopedics;  Laterality: Right;  . Hernia repair  2006    x2  . Esophagogastroduodenoscopy    . Total knee arthroplasty Left 02/07/2016    Procedure: TOTAL KNEE ARTHROPLASTY;  Surgeon: Salvatore Marvel, MD;  Location: [facility] OR;  Service: Orthopedics;  Laterality: Left;    There were no vitals filed for this visit.      Subjective Assessment - 03/14/16 1356    Subjective Pt states she is doing good today. She went to the grocery store and walked through the store without too much difficulty. She went to church and a funeral where she stood for long periods of time. She reported some pain at the end, but nothing too bad.   Pertinent History CVA, DJD, RA, chest pain, and seizures    Patient Stated Goals Pt's main goal is to improve her strength, improve her general mobility, and return to leisure activities including hiking and swimming   Currently in Pain? Yes   Pain Score 2    Pain Location Knee   Pain Orientation Left   Pain Descriptors / Indicators Aching   Pain Type Surgical pain   Pain Onset More than a month ago                         [facility] Adult PT Treatment/Exercise - 03/14/16 0001    Knee/Hip Exercises: Machines for Strengthening   Total Gym Leg Press L27 2x15 with LLE focus (RLE toe assist)    Knee/Hip Exercises: Standing   Forward Step Up Hand Hold: 2;2 sets;10 reps   Forward Step Up Limitations 6' step   Manual Therapy   Manual Therapy Edema management;Joint mobilization;Soft tissue mobilization   Edema Management decongestive techniques to decrease edema    Joint Mobilization Grade III Lt fibular mobs   Soft tissue mobilization scar massage, TrP release lateral gastroc/popliteus                PT Education - 03/14/16 1614    Education provided Yes   Education Details use of roller to decrease lateral thigh restrictions and increase blood flow to the tissue   Person(s) Educated Patient    Methods Explanation;Demonstration   Comprehension Verbalized understanding          PT Short Term Goals - 02/25/16 1024    PT SHORT TERM GOAL #1   Title After 2 weeks patient will be independent with  TKA protocol HEP in order to further progress with ROM and strength at home.   PT SHORT TERM GOAL #2   Title After 4 weeks, patient will report L Knee pain < 4/10 on a VAS 75% of time in order to improve tolerance with ther ex and ambulation.    PT SHORT TERM GOAL #3   Title After 4 weeks patient will demo improved L knee AAROM to 5-110 degrees in order to improve L knee mobility during funcitonal activities.            PT Long Term Goals - 02/25/16 1025    PT LONG TERM GOAL #1   Title After 8 weeks, patient will report reduced L knee pain to <3/10 on a VAS in order to improve tolerance with ther ex and ambulation.    PT LONG TERM GOAL #2   Title After 8 weeks, patient will demo improved L knee AAROM to 0-120 degrees in order to improve stair climbing and leisure activities.    PT LONG TERM GOAL #3   Title After 8 weeks, patient will demo improved L quad and HS strength to >4/5 MMT in order to improve performance with outdoor ambulation and transfers.    PT LONG TERM GOAL #4   Title After 8 weks, pt will score <11s on TUG Test to semondtrat eimproved funcitonal strength and activity tolerance to improve safety at home.                Plan - 03/14/16 1617    Clinical Impression Statement Pt is progressing towards her goals of improved LLE strength as she was able to tolerate increased reps and resistance without too much difficulty during today's session. Manual treatment was performed to decrease knee swelling and alleviate soft tissue/joint restrictions and therapist demonstrated rolling technique to address limited ITB/quad/hamstring mobility at home. Pt with no complaints of pain throughout session. Will continue with current POC.   Rehab Potential Good   PT Frequency 3x /  week   PT Duration 8 weeks   PT Treatment/Interventions ADLs/Self Care Home Management;Cryotherapy;Electrical Stimulation;Moist Heat;Balance training;Therapeutic exercise;Therapeutic activities;Functional mobility training;Stair training;Gait training;DME Instruction;Neuromuscular re-education;Patient/family education;Manual techniques;Taping;Passive range of motion;Scar mobilization   PT Next Visit Plan continue to progress open chain strengthening and begin with graded closed chain strengthening of LLE; manual to address soft tissue/joint restriction   PT Home Exercise Plan next visit update HEP with increased reps/resistance LAQ, introduce more functional strengthening    Recommended Other Services none   Consulted and Agree with Plan of Care Patient      Patient will benefit from skilled therapeutic intervention in order to improve the following deficits and impairments:  Abnormal gait, Decreased range of motion, Difficulty walking, Pain, Decreased balance, Hypomobility, Impaired flexibility, Increased edema, Decreased strength, Decreased mobility, Decreased skin integrity, Decreased activity tolerance, Decreased scar mobility  Visit Diagnosis: Pain in left knee  Stiffness of left knee, not elsewhere classified  Difficulty in walking, not elsewhere classified     Problem List Patient Active Problem List   Diagnosis Date Noted  . Postoperative anemia due to acute blood loss 02/08/2016  . Primary localized osteoarthritis of left knee   . Primary localized osteoarthritis of right knee 11/03/2015  . DJD (degenerative joint disease) of knee 11/03/2015  . Rheumatoid arthritis (HCC)   . PONV (postoperative nausea and vomiting)   . Seizures (HCC)   . Fibromyalgia   . Sleep apnea   . Ventricular septal defect   . History  of staph infection   . Refractory migraine 08/10/2014  . Complex partial epilepsy (HCC) 08/10/2014  . S/P tonsillectomy and adenoidectomy 04/01/2014  . Panic  anxiety syndrome 06/03/2013  . Positive depression screening 06/03/2013  . Headache(784.0) 06/03/2013  . Unspecified cerebral artery occlusion with cerebral infarction 06/03/2013  . Headache 05/29/2013  . Patent foramen ovale 05/29/2013  . Chest pain 05/29/2013  . Other convulsions 05/29/2013  . Dysphagia, unspecified(787.20) 04/17/2013  . Acute bronchitis 03/27/2013  . Persistent headaches 03/26/2013  . CVA (cerebral infarction) history 03/26/2013  . Hypokalemia 03/26/2013  . Leukocytosis 03/26/2013  . Arthritis 03/26/2013  . QT prolongation 03/26/2013  . Stroke (HCC) 03/26/2013  . Recurrent ventral incisional hernia 04/19/2012  . Obstructive sleep apnea 08/22/2010  . RESTLESS LEGS SYNDROME 08/22/2010  . Moderate intermittent asthma without complication 08/22/2010  . ECZEMA 08/22/2010  . Irritable bowel syndrome 06/23/2010  . DIARRHEA 06/23/2010  . NAUSEA WITH VOMITING 11/01/2009  . ABDOMINAL PAIN, LOWER 11/01/2009  . KNEE, ARTHRITIS, DEGEN./OSTEO 11/18/2008  . DERANGEMENT MENISCUS 11/02/2008  . JOINT EFFUSION, KNEE 11/02/2008  . KNEE PAIN 11/02/2008   4:24 PM,03/14/2016 Marylyn Ishihara PT, DPT [facility] [phone]  [facility] [address] [place], [place], [address] Phone: [phone]   Fax:  [phone]  Name: [name] MRN: [id number] Date of Birth: Aug 15, 1961

## 2016-03-16 ENCOUNTER — Ambulatory Visit (HOSPITAL_COMMUNITY): Payer: Medicare Other | Attending: Orthopedic Surgery

## 2016-03-16 DIAGNOSIS — M25562 Pain in left knee: Secondary | ICD-10-CM | POA: Diagnosis present

## 2016-03-16 DIAGNOSIS — M25561 Pain in right knee: Secondary | ICD-10-CM | POA: Diagnosis present

## 2016-03-16 DIAGNOSIS — M25662 Stiffness of left knee, not elsewhere classified: Secondary | ICD-10-CM | POA: Diagnosis present

## 2016-03-16 DIAGNOSIS — R269 Unspecified abnormalities of gait and mobility: Secondary | ICD-10-CM | POA: Insufficient documentation

## 2016-03-16 DIAGNOSIS — Z96651 Presence of right artificial knee joint: Secondary | ICD-10-CM | POA: Insufficient documentation

## 2016-03-16 DIAGNOSIS — M6281 Muscle weakness (generalized): Secondary | ICD-10-CM | POA: Diagnosis present

## 2016-03-16 DIAGNOSIS — M25661 Stiffness of right knee, not elsewhere classified: Secondary | ICD-10-CM | POA: Diagnosis present

## 2016-03-16 DIAGNOSIS — R262 Difficulty in walking, not elsewhere classified: Secondary | ICD-10-CM | POA: Diagnosis present

## 2016-03-16 NOTE — Therapy (Signed)
[facility] [address] [place], [place], [address] Phone: [phone]   Fax:  [phone]  Physical Therapy Treatment  Patient Details  Name: [name] MRN: [id number] Date of Birth: 1961/02/28 Referring Provider: Dr. Salvatore Marvel   Encounter Date: 03/16/2016      PT End of Session - 03/16/16 1501    Visit Number 8   Number of Visits 24   Date for PT Re-Evaluation 03/26/16   Authorization Type Medicare/ BCBS   Authorization Time Period 02/24/2016-04/25/2016   Authorization - Visit Number 8   Authorization - Number of Visits 24   PT Start Time 1308   PT Stop Time 1346   PT Time Calculation (min) 38 min   Activity Tolerance Patient tolerated treatment well   Behavior During Therapy [facility] for tasks assessed/performed      Past Medical History  Diagnosis Date  . IBS (irritable bowel syndrome)     mixed  . Allergic rhinitis     uses Flonase daily as needed and takes CLaritin daily  . Eczema   . Plaque psoriasis   . Cerebral vascular malformation     sees dr Lovell Sheehan for monitoring as needed, sees dr lewitt for headaches every 4 months  . Lactose intolerance   . Anxiety     takes Xanax daily as needed  . Asthma     Albuterol inhaler prn;SYmbicort daily  . Depression     takes Citalopram daily  . Rheumatoid arthritis(714.0) 2010    oa and ra;Rhemicade IV every 6wks and Metotrexate weekly  . Nausea     takes Zofran daily as needed  . GERD (gastroesophageal reflux disease)     takes Protonix daily  . Hyperlipidemia     takes Pravastatin daily  . PONV (postoperative nausea and vomiting)   . Shortness of breath     with exertion  . Pneumonia 57yrs ago    hx of  . History of bronchitis as a child   . History of migraine     last one 10+yrs ago  . Stroke (HCC) 03/26/2013    left sided weakness  . Joint pain   . Joint swelling   . Fibromyalgia   . Diverticulitis at age 19  . History of kidney stones   . Thyroid  cyst   . Insomnia   . History of staph infection 31yrs ago  . Ventricular septal defect     2014 TEE lists large right to left shunt ASD/PFO (NO VSD mentioned)  . Sleep apnea     study done >26yrs ago;uses CPAP nightly  . PFO (patent foramen ovale)   . Primary localized osteoarthritis of right knee 11/03/2015  . Primary localized osteoarthritis of left knee   . Spinal headache     patient states that she thinks she had a spinal headache a long time ago  . Seizures (HCC) 80months ago 03/21/14    takes Depakote daily  . COPD (chronic obstructive pulmonary disease) (HCC)   . Stress incontinence   . Hard of hearing   . Postoperative anemia due to acute blood loss 02/08/2016    Past Surgical History  Procedure Laterality Date  . Incontinence surgery  2010    sling done   . Knee arthroscopy  1992    left  . Foot surgery  1999    right ankle  . Kidney stone surgery  2001  . Appendectomy  1981  . Cholecystectomy  1981  .  Tubal ligation  1990  . Total abdominal hysterectomy  2001  . Left foot plating and scarping for arthritis  2011  . Right ear tube insertion  2011  . Incisional hernia repair  05/20/2012    Procedure: HERNIA REPAIR INCISIONAL;  Surgeon: Clovis Pu. Cornett, MD;  Location: [facility]S;  Service: General;  Laterality: N/A;  . Tee without cardioversion N/A 05/15/2013    Procedure: TRANSESOPHAGEAL ECHOCARDIOGRAM (TEE);  Surgeon: Pamella Pert, MD;  Location: [facility] ENDOSCOPY;  Service: Cardiovascular;  Laterality: N/A;  . Cardiac catheterization  2004  . Colonoscopy    . Thryoid biopsy    . Tonsillectomy and adenoidectomy  04/01/2014  . Tonsillectomy and adenoidectomy Bilateral 04/01/2014    Procedure: BILATERAL TONSILLECTOMY AND ADENOIDECTOMY;  Surgeon: Darletta Moll, MD;  Location: [facility] OR;  Service: ENT;  Laterality: Bilateral;  . Total knee arthroplasty Right 11/03/2015  . Total knee arthroplasty Right 11/03/2015    Procedure: RIGHT TOTAL KNEE ARTHROPLASTY/RIGHT;  Surgeon: Salvatore Marvel, MD;  Location: [facility] OR;  Service: Orthopedics;  Laterality: Right;  . Hernia repair  2006    x2  . Esophagogastroduodenoscopy    . Total knee arthroplasty Left 02/07/2016    Procedure: TOTAL KNEE ARTHROPLASTY;  Surgeon: Salvatore Marvel, MD;  Location: [facility] OR;  Service: Orthopedics;  Laterality: Left;    There were no vitals filed for this visit.      Subjective Assessment - 03/16/16 1312    Subjective Pt reports shes been doing well at home. She still hurts after the lateral manual therapy since last session. She is out of pain pills until the 3rd so a bit more pain.    Patient is accompained by: Family member   Pertinent History CVA, DJD, RA, chest pain, and seizures    Limitations Standing;Walking   Diagnostic tests N/A   Patient Stated Goals Pt's main goal is to improve her strength, improve her general mobility, and return to leisure activities including hiking and swimming   Currently in Pain? Yes   Pain Score 2    Pain Location Knee   Pain Orientation Left   Pain Descriptors / Indicators Aching   Pain Type Surgical pain                         [facility] Adult PT Treatment/Exercise - 03/16/16 0001    Knee/Hip Exercises: Seated   Long Arc Quad 1 set;10 reps   Long Arc Quad Weight --  avooidaing weight due to range retrictions.    Knee/Hip Exercises: Supine   Short Arc Quad Sets 10 reps;1 set   Knee Extension Limitations 6 degrees   Other Supine Knee/Hip Exercises hamstrings bridges on 8" box + 2 inch foam    Manual Therapy   Joint Mobilization Seated 10# traction knee at 80-90* x 10 miutes  Posterior tibiofemoral glides GrIV: 3x30sec   Passive ROM seated tibial rotation 15x med/lat at 90*, knee in traction             Balance Exercises - 03/16/16 1337    Balance Exercises: Standing   Standing Eyes Opened Narrow base of support (BOS);Foam/compliant surface;3 reps  trunk rot, 3x10 alt bilat c orange ball, 10 minutes total.              PT  Short Term Goals - 02/25/16 1024    PT SHORT TERM GOAL #1   Title After 2 weeks patient will be independent with TKA protocol HEP  in order to further progress with ROM and strength at home.   PT SHORT TERM GOAL #2   Title After 4 weeks, patient will report L Knee pain < 4/10 on a VAS 75% of time in order to improve tolerance with ther ex and ambulation.    PT SHORT TERM GOAL #3   Title After 4 weeks patient will demo improved L knee AAROM to 5-110 degrees in order to improve L knee mobility during funcitonal activities.            PT Long Term Goals - 02/25/16 1025    PT LONG TERM GOAL #1   Title After 8 weeks, patient will report reduced L knee pain to <3/10 on a VAS in order to improve tolerance with ther ex and ambulation.    PT LONG TERM GOAL #2   Title After 8 weeks, patient will demo improved L knee AAROM to 0-120 degrees in order to improve stair climbing and leisure activities.    PT LONG TERM GOAL #3   Title After 8 weeks, patient will demo improved L quad and HS strength to >4/5 MMT in order to improve performance with outdoor ambulation and transfers.    PT LONG TERM GOAL #4   Title After 8 weks, pt will score <11s on TUG Test to semondtrat eimproved funcitonal strength and activity tolerance to improve safety at home.                Plan - 03/16/16 1502    Clinical Impression Statement Pt making progress toward all goals evidenced by improved tolerance to knee ranging, manual therapy, and balance activity progression. The patient contnues to demonstrate soem difficulty with swelling in the joint, but has found greater relief throughout use of compression hose at home, as well as icing. Progression of HEP is also going well per patient report.    Rehab Potential Good   PT Frequency 3x / week   PT Duration 8 weeks   PT Treatment/Interventions ADLs/Self Care Home Management;Cryotherapy;Electrical Stimulation;Moist Heat;Balance training;Therapeutic exercise;Therapeutic  activities;Functional mobility training;Stair training;Gait training;DME Instruction;Neuromuscular re-education;Patient/family education;Manual techniques;Taping;Passive range of motion;Scar mobilization   PT Next Visit Plan Progress open chain strengthening and begin with graded closed chain strengthening of LLE; Continue with narrow stance balance, and hamstrings bridging to help with knee extension.    PT Home Exercise Plan Added a lunge, knee driver stretch.    Consulted and Agree with Plan of Care Patient      Patient will benefit from skilled therapeutic intervention in order to improve the following deficits and impairments:  Abnormal gait, Decreased range of motion, Difficulty walking, Pain, Decreased balance, Hypomobility, Impaired flexibility, Increased edema, Decreased strength, Decreased mobility, Decreased skin integrity, Decreased activity tolerance, Decreased scar mobility  Visit Diagnosis: Pain in left knee  Stiffness of left knee, not elsewhere classified  Difficulty in walking, not elsewhere classified  Muscle weakness  Pain in joint of right knee  Abnormality of gait  Stiffness of knee joint, right  Status post total right knee replacement     Problem List Patient Active Problem List   Diagnosis Date Noted  . Postoperative anemia due to acute blood loss 02/08/2016  . Primary localized osteoarthritis of left knee   . Primary localized osteoarthritis of right knee 11/03/2015  . DJD (degenerative joint disease) of knee 11/03/2015  . Rheumatoid arthritis (HCC)   . PONV (postoperative nausea and vomiting)   . Seizures (HCC)   . Fibromyalgia   . Sleep  apnea   . Ventricular septal defect   . History of staph infection   . Refractory migraine 08/10/2014  . Complex partial epilepsy (HCC) 08/10/2014  . S/P tonsillectomy and adenoidectomy 04/01/2014  . Panic anxiety syndrome 06/03/2013  . Positive depression screening 06/03/2013  . Headache(784.0) 06/03/2013   . Unspecified cerebral artery occlusion with cerebral infarction 06/03/2013  . Headache 05/29/2013  . Patent foramen ovale 05/29/2013  . Chest pain 05/29/2013  . Other convulsions 05/29/2013  . Dysphagia, unspecified(787.20) 04/17/2013  . Acute bronchitis 03/27/2013  . Persistent headaches 03/26/2013  . CVA (cerebral infarction) history 03/26/2013  . Hypokalemia 03/26/2013  . Leukocytosis 03/26/2013  . Arthritis 03/26/2013  . QT prolongation 03/26/2013  . Stroke (HCC) 03/26/2013  . Recurrent ventral incisional hernia 04/19/2012  . Obstructive sleep apnea 08/22/2010  . RESTLESS LEGS SYNDROME 08/22/2010  . Moderate intermittent asthma without complication 08/22/2010  . ECZEMA 08/22/2010  . Irritable bowel syndrome 06/23/2010  . DIARRHEA 06/23/2010  . NAUSEA WITH VOMITING 11/01/2009  . ABDOMINAL PAIN, LOWER 11/01/2009  . KNEE, ARTHRITIS, DEGEN./OSTEO 11/18/2008  . DERANGEMENT MENISCUS 11/02/2008  . JOINT EFFUSION, KNEE 11/02/2008  . KNEE PAIN 11/02/2008   3:06 PM, 03/16/2016 Rosamaria Lints, PT, DPT PRN Physical Therapist at [facility]  License # [id number] [phone] (office)     [facility] [address] [place], [place], [address] Phone: [phone]   Fax:  [phone]  Name: [name] MRN: [id number] Date of Birth: [birth year]

## 2016-03-17 ENCOUNTER — Ambulatory Visit (HOSPITAL_COMMUNITY): Payer: Medicare Other | Admitting: Physical Therapy

## 2016-03-17 DIAGNOSIS — M25562 Pain in left knee: Secondary | ICD-10-CM

## 2016-03-17 DIAGNOSIS — R262 Difficulty in walking, not elsewhere classified: Secondary | ICD-10-CM

## 2016-03-17 DIAGNOSIS — M25662 Stiffness of left knee, not elsewhere classified: Secondary | ICD-10-CM

## 2016-03-17 DIAGNOSIS — M6281 Muscle weakness (generalized): Secondary | ICD-10-CM

## 2016-03-17 NOTE — Therapy (Signed)
[facility] [address] [place], [place], [address] Phone: [phone]   Fax:  [phone]  Physical Therapy Treatment  Patient Details  Name: [name] MRN: [id number] Date of Birth: 1960-12-10 Referring Provider: Dr. Salvatore Marvel   Encounter Date: 03/17/2016      PT End of Session - 03/17/16 1356    Visit Number 9   Number of Visits 24   Date for PT Re-Evaluation 03/26/16   Authorization Type Medicare/ BCBS   Authorization Time Period 02/24/2016-04/25/2016   Authorization - Visit Number 9   Authorization - Number of Visits 24   PT Start Time 1305   PT Stop Time 1345   PT Time Calculation (min) 40 min   Activity Tolerance Patient tolerated treatment well   Behavior During Therapy [facility] for tasks assessed/performed      Past Medical History  Diagnosis Date  . IBS (irritable bowel syndrome)     mixed  . Allergic rhinitis     uses Flonase daily as needed and takes CLaritin daily  . Eczema   . Plaque psoriasis   . Cerebral vascular malformation     sees dr Lovell Sheehan for monitoring as needed, sees dr lewitt for headaches every 4 months  . Lactose intolerance   . Anxiety     takes Xanax daily as needed  . Asthma     Albuterol inhaler prn;SYmbicort daily  . Depression     takes Citalopram daily  . Rheumatoid arthritis(714.0) 2010    oa and ra;Rhemicade IV every 6wks and Metotrexate weekly  . Nausea     takes Zofran daily as needed  . GERD (gastroesophageal reflux disease)     takes Protonix daily  . Hyperlipidemia     takes Pravastatin daily  . PONV (postoperative nausea and vomiting)   . Shortness of breath     with exertion  . Pneumonia 27yrs ago    hx of  . History of bronchitis as a child   . History of migraine     last one 10+yrs ago  . Stroke (HCC) 03/26/2013    left sided weakness  . Joint pain   . Joint swelling   . Fibromyalgia   . Diverticulitis at age 54  . History of kidney stones   . Thyroid  cyst   . Insomnia   . History of staph infection 77yrs ago  . Ventricular septal defect     2014 TEE lists large right to left shunt ASD/PFO (NO VSD mentioned)  . Sleep apnea     study done >67yrs ago;uses CPAP nightly  . PFO (patent foramen ovale)   . Primary localized osteoarthritis of right knee 11/03/2015  . Primary localized osteoarthritis of left knee   . Spinal headache     patient states that she thinks she had a spinal headache a long time ago  . Seizures (HCC) 66months ago 03/21/14    takes Depakote daily  . COPD (chronic obstructive pulmonary disease) (HCC)   . Stress incontinence   . Hard of hearing   . Postoperative anemia due to acute blood loss 02/08/2016    Past Surgical History  Procedure Laterality Date  . Incontinence surgery  2010    sling done   . Knee arthroscopy  1992    left  . Foot surgery  1999    right ankle  . Kidney stone surgery  2001  . Appendectomy  1981  . Cholecystectomy  1981  .  Tubal ligation  1990  . Total abdominal hysterectomy  2001  . Left foot plating and scarping for arthritis  2011  . Right ear tube insertion  2011  . Incisional hernia repair  05/20/2012    Procedure: HERNIA REPAIR INCISIONAL;  Surgeon: Clovis Pu. Cornett, MD;  Location: [facility] ORS;  Service: General;  Laterality: N/A;  . Tee without cardioversion N/A 05/15/2013    Procedure: TRANSESOPHAGEAL ECHOCARDIOGRAM (TEE);  Surgeon: Pamella Pert, MD;  Location: [facility] ENDOSCOPY;  Service: Cardiovascular;  Laterality: N/A;  . Cardiac catheterization  2004  . Colonoscopy    . Thryoid biopsy    . Tonsillectomy and adenoidectomy  04/01/2014  . Tonsillectomy and adenoidectomy Bilateral 04/01/2014    Procedure: BILATERAL TONSILLECTOMY AND ADENOIDECTOMY;  Surgeon: Darletta Moll, MD;  Location: [facility] OR;  Service: ENT;  Laterality: Bilateral;  . Total knee arthroplasty Right 11/03/2015  . Total knee arthroplasty Right 11/03/2015    Procedure: RIGHT TOTAL KNEE ARTHROPLASTY/RIGHT;  Surgeon: Salvatore Marvel, MD;  Location: [facility] OR;  Service: Orthopedics;  Laterality: Right;  . Hernia repair  2006    x2  . Esophagogastroduodenoscopy    . Total knee arthroplasty Left 02/07/2016    Procedure: TOTAL KNEE ARTHROPLASTY;  Surgeon: Salvatore Marvel, MD;  Location: [facility] OR;  Service: Orthopedics;  Laterality: Left;    There were no vitals filed for this visit.      Subjective Assessment - 03/17/16 1314    Subjective Pt states she is very sore from her session yesterday. She has attempted to ice her knee and it helped alleviate some of her pain. She is going to get more pain medicine after her session today.   Patient is accompained by: Family member   Pertinent History CVA, DJD, RA, chest pain, and seizures    Limitations Standing;Walking   Diagnostic tests N/A   Patient Stated Goals Pt's main goal is to improve her strength, improve her general mobility, and return to leisure activities including hiking and swimming   Currently in Pain? Yes  L knee and lateral thigh   Pain Score 4   to pain meds prior to session                         [facility] Adult PT Treatment/Exercise - 03/17/16 0001    Bed Mobility   Bed Mobility    Knee/Hip Exercises: Standing   Forward Step Up Left;1 set;15 reps;Hand Hold: 1;Step Height: 6"   Knee/Hip Exercises: Seated   Long Arc Quad Left;Strengthening;2 sets;15 reps   Long Arc Quad Weight 5 lbs.   Knee/Hip Exercises: Supine   Other Supine Knee/Hip Exercises hamstrings bridges on 8" box + 2 inch foam 2x10 reps             Balance Exercises - 03/16/16 1337    Balance Exercises: Standing   Standing Eyes Opened Narrow base of support (BOS);Foam/compliant surface;3 reps  trunk rot, 3x10 alt bilat c orange ball, 10 minutes total.            PT Education - 03/17/16 1355    Education provided Yes   Education Details discussed reasoning for soreness after increased reps/resistance with exercise; encouraged continued icing to alleviate swelling    Person(s) Educated Patient   Methods Explanation;Demonstration   Comprehension Verbalized understanding;Returned demonstration          PT Short Term Goals - 02/25/16 1024    PT SHORT TERM GOAL #1  Title After 2 weeks patient will be independent with TKA protocol HEP in order to further progress with ROM and strength at home.   PT SHORT TERM GOAL #2   Title After 4 weeks, patient will report L Knee pain < 4/10 on a VAS 75% of time in order to improve tolerance with ther ex and ambulation.    PT SHORT TERM GOAL #3   Title After 4 weeks patient will demo improved L knee AAROM to 5-110 degrees in order to improve L knee mobility during funcitonal activities.            PT Long Term Goals - 02/25/16 1025    PT LONG TERM GOAL #1   Title After 8 weeks, patient will report reduced L knee pain to <3/10 on a VAS in order to improve tolerance with ther ex and ambulation.    PT LONG TERM GOAL #2   Title After 8 weeks, patient will demo improved L knee AAROM to 0-120 degrees in order to improve stair climbing and leisure activities.    PT LONG TERM GOAL #3   Title After 8 weeks, patient will demo improved L quad and HS strength to >4/5 MMT in order to improve performance with outdoor ambulation and transfers.    PT LONG TERM GOAL #4   Title After 8 weks, pt will score <11s on TUG Test to semondtrat eimproved funcitonal strength and activity tolerance to improve safety at home.                Plan - 03/17/16 1358    Clinical Impression Statement Pt with increased soreness this session, however she continues to perform her HEP regularly at home. She recently trialed ice and felt relief of pain. Session focused on open and closed chain therex to improve Lt knee strength. Pt was able to perform with increased reps without any report of pain. Encouraged her to notify MD next week concerning pain in her posterior knee, otherwise will continue with current POC.   Rehab Potential Good    PT Frequency 3x / week   PT Duration 8 weeks   PT Treatment/Interventions ADLs/Self Care Home Management;Cryotherapy;Electrical Stimulation;Moist Heat;Balance training;Therapeutic exercise;Therapeutic activities;Functional mobility training;Stair training;Gait training;DME Instruction;Neuromuscular re-education;Patient/family education;Manual techniques;Taping;Passive range of motion;Scar mobilization   PT Next Visit Plan teach hamstring bridge on physioball; Progress open chain strengthening and begin with graded closed chain strengthening of LLE; Continue with narrow stance balance, and hamstrings bridging to help with knee extension.    PT Home Exercise Plan Added a lunge, knee driver stretch.    Consulted and Agree with Plan of Care Patient      Patient will benefit from skilled therapeutic intervention in order to improve the following deficits and impairments:  Abnormal gait, Decreased range of motion, Difficulty walking, Pain, Decreased balance, Hypomobility, Impaired flexibility, Increased edema, Decreased strength, Decreased mobility, Decreased skin integrity, Decreased activity tolerance, Decreased scar mobility  Visit Diagnosis: Pain in left knee  Stiffness of left knee, not elsewhere classified  Difficulty in walking, not elsewhere classified  Muscle weakness     Problem List Patient Active Problem List   Diagnosis Date Noted  . Postoperative anemia due to acute blood loss 02/08/2016  . Primary localized osteoarthritis of left knee   . Primary localized osteoarthritis of right knee 11/03/2015  . DJD (degenerative joint disease) of knee 11/03/2015  . Rheumatoid arthritis (HCC)   . PONV (postoperative nausea and vomiting)   . Seizures (HCC)   .  Fibromyalgia   . Sleep apnea   . Ventricular septal defect   . History of staph infection   . Refractory migraine 08/10/2014  . Complex partial epilepsy (HCC) 08/10/2014  . S/P tonsillectomy and adenoidectomy 04/01/2014  .  Panic anxiety syndrome 06/03/2013  . Positive depression screening 06/03/2013  . Headache(784.0) 06/03/2013  . Unspecified cerebral artery occlusion with cerebral infarction 06/03/2013  . Headache 05/29/2013  . Patent foramen ovale 05/29/2013  . Chest pain 05/29/2013  . Other convulsions 05/29/2013  . Dysphagia, unspecified(787.20) 04/17/2013  . Acute bronchitis 03/27/2013  . Persistent headaches 03/26/2013  . CVA (cerebral infarction) history 03/26/2013  . Hypokalemia 03/26/2013  . Leukocytosis 03/26/2013  . Arthritis 03/26/2013  . QT prolongation 03/26/2013  . Stroke (HCC) 03/26/2013  . Recurrent ventral incisional hernia 04/19/2012  . Obstructive sleep apnea 08/22/2010  . RESTLESS LEGS SYNDROME 08/22/2010  . Moderate intermittent asthma without complication 08/22/2010  . ECZEMA 08/22/2010  . Irritable bowel syndrome 06/23/2010  . DIARRHEA 06/23/2010  . NAUSEA WITH VOMITING 11/01/2009  . ABDOMINAL PAIN, LOWER 11/01/2009  . KNEE, ARTHRITIS, DEGEN./OSTEO 11/18/2008  . DERANGEMENT MENISCUS 11/02/2008  . JOINT EFFUSION, KNEE 11/02/2008  . KNEE PAIN 11/02/2008   2:10 PM,03/17/2016 Marylyn Ishihara PT, DPT [facility] [phone]  [facility] [address] [place], [place], [address] Phone: [phone]   Fax:  [phone]  Name: KALENA MANDER MRN: [id number] Date of Birth: 1960/11/17

## 2016-03-20 ENCOUNTER — Ambulatory Visit (HOSPITAL_COMMUNITY): Payer: Medicare Other | Admitting: Physical Therapy

## 2016-03-20 ENCOUNTER — Other Ambulatory Visit (HOSPITAL_COMMUNITY): Payer: Self-pay | Admitting: Orthopedic Surgery

## 2016-03-20 DIAGNOSIS — R609 Edema, unspecified: Secondary | ICD-10-CM

## 2016-03-20 DIAGNOSIS — M79662 Pain in left lower leg: Secondary | ICD-10-CM

## 2016-03-20 DIAGNOSIS — M6281 Muscle weakness (generalized): Secondary | ICD-10-CM

## 2016-03-20 DIAGNOSIS — M25662 Stiffness of left knee, not elsewhere classified: Secondary | ICD-10-CM

## 2016-03-20 DIAGNOSIS — M25562 Pain in left knee: Secondary | ICD-10-CM

## 2016-03-20 DIAGNOSIS — R262 Difficulty in walking, not elsewhere classified: Secondary | ICD-10-CM

## 2016-03-20 NOTE — Therapy (Signed)
[facility] [address] [place], [place], [address] Phone: [phone]   Fax:  [phone]  Physical Therapy Treatment  Patient Details  Name: [name] MRN: [id number] Date of Birth: January 14, 1961 Referring Provider: Dr. Salvatore Marvel   Encounter Date: 03/20/2016      PT End of Session - 03/20/16 1510    Visit Number 10   Number of Visits 24   Date for PT Re-Evaluation 03/26/16   Authorization Type Medicare/ BCBS   Authorization Time Period 02/24/2016-04/25/2016   Authorization - Visit Number 9   Authorization - Number of Visits 24   PT Start Time 1315  Pt arriving 15 min late   PT Stop Time 1345   PT Time Calculation (min) 30 min   Activity Tolerance Patient tolerated treatment well   Behavior During Therapy [facility] for tasks assessed/performed      Past Medical History  Diagnosis Date  . IBS (irritable bowel syndrome)     mixed  . Allergic rhinitis     uses Flonase daily as needed and takes CLaritin daily  . Eczema   . Plaque psoriasis   . Cerebral vascular malformation     sees dr Lovell Sheehan for monitoring as needed, sees dr lewitt for headaches every 4 months  . Lactose intolerance   . Anxiety     takes Xanax daily as needed  . Asthma     Albuterol inhaler prn;SYmbicort daily  . Depression     takes Citalopram daily  . Rheumatoid arthritis(714.0) 2010    oa and ra;Rhemicade IV every 6wks and Metotrexate weekly  . Nausea     takes Zofran daily as needed  . GERD (gastroesophageal reflux disease)     takes Protonix daily  . Hyperlipidemia     takes Pravastatin daily  . PONV (postoperative nausea and vomiting)   . Shortness of breath     with exertion  . Pneumonia 90yrs ago    hx of  . History of bronchitis as a child   . History of migraine     last one 10+yrs ago  . Stroke (HCC) 03/26/2013    left sided weakness  . Joint pain   . Joint swelling   . Fibromyalgia   . Diverticulitis at age 60  . History of  kidney stones   . Thyroid cyst   . Insomnia   . History of staph infection 55yrs ago  . Ventricular septal defect     2014 TEE lists large right to left shunt ASD/PFO (NO VSD mentioned)  . Sleep apnea     study done >84yrs ago;uses CPAP nightly  . PFO (patent foramen ovale)   . Primary localized osteoarthritis of right knee 11/03/2015  . Primary localized osteoarthritis of left knee   . Spinal headache     patient states that she thinks she had a spinal headache a long time ago  . Seizures (HCC) 30months ago 03/21/14    takes Depakote daily  . COPD (chronic obstructive pulmonary disease) (HCC)   . Stress incontinence   . Hard of hearing   . Postoperative anemia due to acute blood loss 02/08/2016    Past Surgical History  Procedure Laterality Date  . Incontinence surgery  2010    sling done   . Knee arthroscopy  1992    left  . Foot surgery  1999    right ankle  . Kidney stone surgery  2001  . Appendectomy  1981  .  Cholecystectomy  1981  . Tubal ligation  1990  . Total abdominal hysterectomy  2001  . Left foot plating and scarping for arthritis  2011  . Right ear tube insertion  2011  . Incisional hernia repair  05/20/2012    Procedure: HERNIA REPAIR INCISIONAL;  Surgeon: Clovis Pu. Cornett, MD;  Location: [facility] ORS;  Service: General;  Laterality: N/A;  . Tee without cardioversion N/A 05/15/2013    Procedure: TRANSESOPHAGEAL ECHOCARDIOGRAM (TEE);  Surgeon: Pamella Pert, MD;  Location: [facility] ENDOSCOPY;  Service: Cardiovascular;  Laterality: N/A;  . Cardiac catheterization  2004  . Colonoscopy    . Thryoid biopsy    . Tonsillectomy and adenoidectomy  04/01/2014  . Tonsillectomy and adenoidectomy Bilateral 04/01/2014    Procedure: BILATERAL TONSILLECTOMY AND ADENOIDECTOMY;  Surgeon: Darletta Moll, MD;  Location: [facility] OR;  Service: ENT;  Laterality: Bilateral;  . Total knee arthroplasty Right 11/03/2015  . Total knee arthroplasty Right 11/03/2015    Procedure: RIGHT TOTAL KNEE  ARTHROPLASTY/RIGHT;  Surgeon: Salvatore Marvel, MD;  Location: [facility] OR;  Service: Orthopedics;  Laterality: Right;  . Hernia repair  2006    x2  . Esophagogastroduodenoscopy    . Total knee arthroplasty Left 02/07/2016    Procedure: TOTAL KNEE ARTHROPLASTY;  Surgeon: Salvatore Marvel, MD;  Location: [facility] OR;  Service: Orthopedics;  Laterality: Left;    There were no vitals filed for this visit.      Subjective Assessment - 03/20/16 1323    Subjective Pt states she continues to do her exercises and just can't get over the pain in the back of her knee. She is going to see Dr Thurston Hole after her session.    Patient Stated Goals Pt's main goal is to improve her strength, improve her general mobility, and return to leisure activities including hiking and swimming   Currently in Pain? Yes  R: 5/10, L: 3/10 posterolateral knee   Pain Location Knee   Pain Orientation Right;Left   Pain Descriptors / Indicators Aching;Sharp   Pain Type Surgical pain   Pain Radiating Towards none   Pain Onset 1 to 4 weeks ago   Pain Frequency Constant   Aggravating Factors  weight bearing   Pain Relieving Factors compression, tylenol   Effect of Pain on Daily Activities ADLs, other activity                              Balance Exercises - 03/20/16 1341    Balance Exercises: Standing   Standing Eyes Closed Narrow base of support (BOS);3 reps  7-30sec   Tandem Stance Eyes open;3 reps;20 secs  R: 10-13 sec max, L: 30 sec max   SLS Eyes open;3 reps;20 secs  L: 30 sec, R: 5-12 sec           PT Education - 03/20/16 1508    Education provided Yes   Education Details discussed reasoning for getting clearance from MD to rule out DVT before proceeding with manual treatment to address Lt knee pain; discussed proprioception and changes after knee replacements; discussed reassessment coming up in the next few visits   Methods Explanation;Demonstration   Comprehension Verbalized understanding           PT Short Term Goals - 02/25/16 1024    PT SHORT TERM GOAL #1   Title After 2 weeks patient will be independent with TKA protocol HEP in order to further progress with ROM and strength at  home.   PT SHORT TERM GOAL #2   Title After 4 weeks, patient will report L Knee pain < 4/10 on a VAS 75% of time in order to improve tolerance with ther ex and ambulation.    PT SHORT TERM GOAL #3   Title After 4 weeks patient will demo improved L knee AAROM to 5-110 degrees in order to improve L knee mobility during funcitonal activities.            PT Long Term Goals - 02/25/16 1025    PT LONG TERM GOAL #1   Title After 8 weeks, patient will report reduced L knee pain to <3/10 on a VAS in order to improve tolerance with ther ex and ambulation.    PT LONG TERM GOAL #2   Title After 8 weeks, patient will demo improved L knee AAROM to 0-120 degrees in order to improve stair climbing and leisure activities.    PT LONG TERM GOAL #3   Title After 8 weeks, patient will demo improved L quad and HS strength to >4/5 MMT in order to improve performance with outdoor ambulation and transfers.    PT LONG TERM GOAL #4   Title After 8 weks, pt will score <11s on TUG Test to semondtrat eimproved funcitonal strength and activity tolerance to improve safety at home.                Plan - 04/07/2016 1511    Clinical Impression Statement Pt arrived 15 minutes late to today's appointment so the remainder of the session focused on balance activity within pt's pain tolerance. She continues to report Lt posterior knee pain and swelling and therapist encouraged her to notify surgeon during today's visit in order to rule out possibility of DVT. Otherwise she is demonstrating improved knee ROM and strength as well as decreased edema. Will follow up with pt tomorrow concerning her follow up appointment.    Rehab Potential Good   PT Frequency 3x / week   PT Duration 8 weeks   PT Treatment/Interventions ADLs/Self Care  Home Management;Cryotherapy;Electrical Stimulation;Moist Heat;Balance training;Therapeutic exercise;Therapeutic activities;Functional mobility training;Stair training;Gait training;DME Instruction;Neuromuscular re-education;Patient/family education;Manual techniques;Taping;Passive range of motion;Scar mobilization   PT Next Visit Plan teach hamstring bridge on physioball; Progress open chain strengthening and begin with graded closed chain strengthening of LLE; Continue with narrow stance balance, and hamstrings bridging to help with knee extension.    PT Home Exercise Plan no updates this visit   Consulted and Agree with Plan of Care Patient      Patient will benefit from skilled therapeutic intervention in order to improve the following deficits and impairments:  Abnormal gait, Decreased range of motion, Difficulty walking, Pain, Decreased balance, Hypomobility, Impaired flexibility, Increased edema, Decreased strength, Decreased mobility, Decreased skin integrity, Decreased activity tolerance, Decreased scar mobility  Visit Diagnosis: Pain in left knee  Stiffness of left knee, not elsewhere classified  Difficulty in walking, not elsewhere classified  Muscle weakness       G-Codes - 2016/04/07 1518    Functional Assessment Tool Used Clinical judgement based on ROM, functional strength and mobility   Functional Limitation Mobility: Walking and moving around   Mobility: Walking and Moving Around Current Status ([id number]) At least 40 percent but less than 60 percent impaired, limited or restricted   Mobility: Walking and Moving Around Goal Status [phone]) At least 20 percent but less than 40 percent impaired, limited or restricted      Problem List Patient Active Problem List  Diagnosis Date Noted  . Postoperative anemia due to acute blood loss 02/08/2016  . Primary localized osteoarthritis of left knee   . Primary localized osteoarthritis of right knee 11/03/2015  . DJD (degenerative  joint disease) of knee 11/03/2015  . Rheumatoid arthritis (HCC)   . PONV (postoperative nausea and vomiting)   . Seizures (HCC)   . Fibromyalgia   . Sleep apnea   . Ventricular septal defect   . History of staph infection   . Refractory migraine 08/10/2014  . Complex partial epilepsy (HCC) 08/10/2014  . S/P tonsillectomy and adenoidectomy 04/01/2014  . Panic anxiety syndrome 06/03/2013  . Positive depression screening 06/03/2013  . Headache(784.0) 06/03/2013  . Unspecified cerebral artery occlusion with cerebral infarction 06/03/2013  . Headache 05/29/2013  . Patent foramen ovale 05/29/2013  . Chest pain 05/29/2013  . Other convulsions 05/29/2013  . Dysphagia, unspecified(787.20) 04/17/2013  . Acute bronchitis 03/27/2013  . Persistent headaches 03/26/2013  . CVA (cerebral infarction) history 03/26/2013  . Hypokalemia 03/26/2013  . Leukocytosis 03/26/2013  . Arthritis 03/26/2013  . QT prolongation 03/26/2013  . Stroke (HCC) 03/26/2013  . Recurrent ventral incisional hernia 04/19/2012  . Obstructive sleep apnea 08/22/2010  . RESTLESS LEGS SYNDROME 08/22/2010  . Moderate intermittent asthma without complication 08/22/2010  . ECZEMA 08/22/2010  . Irritable bowel syndrome 06/23/2010  . DIARRHEA 06/23/2010  . NAUSEA WITH VOMITING 11/01/2009  . ABDOMINAL PAIN, LOWER 11/01/2009  . KNEE, ARTHRITIS, DEGEN./OSTEO 11/18/2008  . DERANGEMENT MENISCUS 11/02/2008  . JOINT EFFUSION, KNEE 11/02/2008  . KNEE PAIN 11/02/2008   3:23 PM,03/20/2016 Marylyn Ishihara PT, DPT [facility] [phone]  [facility] [address] [place], [place], [address] Phone: [phone]   Fax:  [phone]  Name: [name] MRN: [id number] Date of Birth: 1960-12-25

## 2016-03-21 ENCOUNTER — Encounter (HOSPITAL_COMMUNITY): Payer: BC Managed Care – PPO | Admitting: Physical Therapy

## 2016-03-21 ENCOUNTER — Ambulatory Visit (HOSPITAL_COMMUNITY)
Admission: RE | Admit: 2016-03-21 | Discharge: 2016-03-21 | Disposition: A | Discharge status: Home / Self Care | Payer: Medicare Other | Source: Ambulatory Visit | Attending: Orthopedic Surgery | Admitting: Orthopedic Surgery

## 2016-03-21 DIAGNOSIS — M79662 Pain in left lower leg: Secondary | ICD-10-CM | POA: Diagnosis present

## 2016-03-21 DIAGNOSIS — R609 Edema, unspecified: Secondary | ICD-10-CM | POA: Insufficient documentation

## 2016-03-23 ENCOUNTER — Encounter (HOSPITAL_COMMUNITY): Payer: BC Managed Care – PPO | Admitting: Physical Therapy

## 2016-03-27 ENCOUNTER — Ambulatory Visit (HOSPITAL_COMMUNITY): Payer: Medicare Other | Admitting: Physical Therapy

## 2016-03-27 DIAGNOSIS — M25662 Stiffness of left knee, not elsewhere classified: Secondary | ICD-10-CM

## 2016-03-27 DIAGNOSIS — M25562 Pain in left knee: Secondary | ICD-10-CM

## 2016-03-27 DIAGNOSIS — R262 Difficulty in walking, not elsewhere classified: Secondary | ICD-10-CM

## 2016-03-27 NOTE — Therapy (Signed)
[place], [place], [address] Phone: [phone]   Fax:  [phone]  Physical Therapy Treatment/Reassessment  Patient Details  Name: [name] MRN: [id number] Date of Birth: 1961/05/02 Referring Provider: Elsie Saas, MD  Encounter Date: 03/27/2016      PT End of Session - 03/27/16 1518    Visit Number 11   Number of Visits 24   Date for PT Re-Evaluation 03/26/16   Authorization Type Medicare/ BCBS   Authorization Time Period 02/24/2016-04/25/2016   Authorization - Visit Number 9   Authorization - Number of Visits 24   PT Start Time 0938   PT Stop Time 1429   PT Time Calculation (min) 42 min   Activity Tolerance Patient tolerated treatment well   Behavior During Therapy [facility] for tasks assessed/performed      Past Medical History  Diagnosis Date  . IBS (irritable bowel syndrome)     mixed  . Allergic rhinitis     uses Flonase daily as needed and takes CLaritin daily  . Eczema   . Plaque psoriasis   . Cerebral vascular malformation     sees dr Arnoldo Morale for monitoring as needed, sees dr lewitt for headaches every 4 months  . Lactose intolerance   . Anxiety     takes Xanax daily as needed  . Asthma     Albuterol inhaler prn;SYmbicort daily  . Depression     takes Citalopram daily  . Rheumatoid arthritis(714.0) 2010    oa and ra;Rhemicade IV every 6wks and Metotrexate weekly  . Nausea     takes Zofran daily as needed  . GERD (gastroesophageal reflux disease)     takes Protonix daily  . Hyperlipidemia     takes Pravastatin daily  . PONV (postoperative nausea and vomiting)   . Shortness of breath     with exertion  . Pneumonia 27yr ago    hx of  . History of bronchitis as a child   . History of migraine     last one 10+yrs ago  . Stroke [place] 03[place]    left sided weakness  . Joint pain   . Joint swelling   . Fibromyalgia   . Diverticulitis at age 55 . History of kidney stones    . Thyroid cyst   . Insomnia   . History of staph infection 57yrago  . Ventricular septal defect     2014 TEE lists large right to left shunt ASD/PFO (NO VSD mentioned)  . Sleep apnea     study done >5y57yrgo;uses CPAP nightly  . PFO (patent foramen ovale)   . Primary localized osteoarthritis of right knee 11/03/2015  . Primary localized osteoarthritis of left knee   . Spinal headache     patient states that she thinks she had a spinal headache a long time ago  . Seizures ([id number] 03/21/14    takes Depakote daily  . COPD (chronic obstructive pulmonary disease) (HCC)[place]. Stress incontinence   . Hard of hearing   . Postoperative anemia due to acute blood loss 02/08/2016    Past Surgical History  Procedure Laterality Date  . Incontinence surgery  2010    sling done   . Knee arthroscopy  1992    left  . Foot surgery  1999    right ankle  . Kidney stone surgery  2001  . Appendectomy  1981  . Cholecystectomy  1981  . Tubal  ligation  1990  . Total abdominal hysterectomy  2001  . Left foot plating and scarping for arthritis  2011  . Right ear tube insertion  2011  . Incisional hernia repair  05/20/2012    Procedure: HERNIA REPAIR INCISIONAL;  Surgeon: Joyice Faster. Cornett, MD;  Location: [facility];  Service: General;  Laterality: N/A;  . Tee without cardioversion N/A 05/15/2013    Procedure: TRANSESOPHAGEAL ECHOCARDIOGRAM (TEE);  Surgeon: Laverda Page, MD;  Location: [place];  Service: Cardiovascular;  Laterality: N/A;  . Cardiac catheterization  2004  . Colonoscopy    . Thryoid biopsy    . Tonsillectomy and adenoidectomy  04/01/2014  . Tonsillectomy and adenoidectomy Bilateral 04/01/2014    Procedure: BILATERAL TONSILLECTOMY AND ADENOIDECTOMY;  Surgeon: Ascencion Dike, MD;  Location: [place];  Service: ENT;  Laterality: Bilateral;  . Total knee arthroplasty Right 11/03/2015  . Total knee arthroplasty Right 11/03/2015    Procedure: RIGHT TOTAL KNEE ARTHROPLASTY/RIGHT;  Surgeon:  Elsie Saas, MD;  Location: [place];  Service: Orthopedics;  Laterality: Right;  . Hernia repair  2006    x2  . Esophagogastroduodenoscopy    . Total knee arthroplasty Left 02/07/2016    Procedure: TOTAL KNEE ARTHROPLASTY;  Surgeon: Elsie Saas, MD;  Location: [place];  Service: Orthopedics;  Laterality: Left;    There were no vitals filed for this visit.      Subjective Assessment - 03/27/16 1349    Subjective Pt states her knee has been doing good. Noting 3-4/10 pain which is mostly at night. She has been doing alot of walking and keeping up with her exercises without much difficulty.   Pertinent History CVA, DJD, RA, chest pain, and seizures    Limitations Walking;Other (comment)  steps    How long can you sit comfortably? no more than 1-2 horus   How long can you stand comfortably? 20-30 minutes    How long can you walk comfortably? 30 minutes    Patient Stated Goals Pt's main goal is to improve her strength, improve her general mobility, and return to leisure activities including hiking and swimming   Currently in Pain? Yes   Pain Score 1    Pain Location Knee   Pain Orientation Left   Pain Descriptors / Indicators Aching   Pain Type Surgical pain   Pain Radiating Towards None    Pain Onset More than a month ago   Pain Frequency Intermittent   Aggravating Factors  walking and standing for long periods of time   Pain Relieving Factors compression, moving it, sleeping with pillow between knees    Effect of Pain on Daily Activities limited long participation in activity   Multiple Pain Sites No            [facility] PT Assessment - 03/27/16 0001    Assessment   Medical Diagnosis s/p L TKA    Referring Provider Elsie Saas, MD   Onset Date/Surgical Date 01/22/16   Hand Dominance Left   Next MD Visit April 20, 2016   Prior Therapy Home health physical therapy post op    Restrictions   Weight Bearing Restrictions No   Balance Screen   Has the patient fallen in the past 6  months No   Has the patient had a decrease in activity level because of a fear of falling?  No   Is the patient reluctant to leave their home because of a fear of falling?  No   Home Environment  Living Environment Private residence   Living Arrangements Spouse/significant other   Available Help at Discharge Family   Type of [place] to enter   Entrance Stairs-Number of Steps 3   [place] One level   [place] - 2 wheels;Cane - single point;Other (comment);Bedside commode;Shower seat   Prior Function   Level of Independence Needs assistance with homemaking;Independent with household mobility with device;Independent with basic ADLs   Observation/Other Assessments-Edema    Edema --  None noted   Sensation   Light Touch Appears Intact   PROM   Left Knee Extension 5   Left Knee Flexion 110   Ambulation/Gait   Gait Comments ascend with increased time to complete and with reciprocal pattern using 2 handrails; descends with Lt step to pattern, 2 handrails and increased time to complete   Timed Up and Go Test   Normal TUG (seconds) 9.4                     [facility] Adult PT Treatment/Exercise - 03/27/16 0001    Knee/Hip Exercises: Standing   Functional Squat 15 reps  cues for improved eccentric control   Knee/Hip Exercises: Supine   Hip Adduction Isometric Strengthening  attempted but pt unable to complete secondary abdomen pain   Knee/Hip Exercises: Sidelying   Hip ABduction Left;1 set;10 reps   Hip ABduction Limitations Against wall    Hip ADduction --             Balance Exercises - 03/27/16 1414    Balance Exercises: Standing   Tandem Stance Eyes open;2 reps  max 30 sec each   SLS Eyes open;2 reps  R: max 5-6 sec, L: max 15-20 sec           PT Education - 03/27/16 1458    Education provided Yes   Education Details reviewed/updated HEP; discussed goals/POC   Person(s) Educated  Patient   Methods Explanation;Demonstration;Handout   Comprehension Verbalized understanding;Returned demonstration          PT Short Term Goals - 03/27/16 1359    PT SHORT TERM GOAL #1   Title After 2 weeks patient will be independent with TKA protocol HEP in order to further progress with ROM and strength at home.   Status Achieved   PT SHORT TERM GOAL #2   Title After 4 weeks, patient will report L Knee pain < 4/10 on a VAS 75% of time in order to improve tolerance with ther ex and ambulation.    Baseline 7/10 when she rolls over in the bed or riding in the car for long period of time, all other 3-4 or less   Status Partially Met   PT SHORT TERM GOAL #3   Title After 4 weeks patient will demo improved L knee AAROM to 5-110 degrees in order to improve L knee mobility during funcitonal activities.    Status Achieved           PT Long Term Goals - 03/27/16 1401    PT LONG TERM GOAL #1   Title After 8 weeks, patient will report reduced L knee pain to <3/10 on a VAS in order to improve tolerance with ther ex and ambulation.    Status Not Met   PT LONG TERM GOAL #2   Title After 8 weeks, patient will demo improved L knee AAROM to 0-120 degrees in order to improve stair climbing and  leisure activities.    Status Partially Met   PT LONG TERM GOAL #3   Title After 8 weeks, patient will demo improved L quad and HS strength to >4/5 MMT in order to improve performance with outdoor ambulation and transfers.    PT LONG TERM GOAL #4   Title After 8 weks, pt will score <11s on TUG Test to semondtrat eimproved funcitonal strength and activity tolerance to improve safety at home.    Baseline 03/27/16: 9.46 sec without AD   Status Partially Met   PT LONG TERM GOAL #5   Title Pt will demo improved strength evident by their ability to ascend/descend 6" steps with reciprocal pattern and no handrails with no more than supervision, x3 trials.   Time 2   Period Weeks   Status New                Plan - 03/27/16 1518    Clinical Impression Statement Pt was reassessed this visit having shown progress towards all goals. She has minimal swelling, improved LE strength, ROM and functional performance with TUG and single leg balance. She occasionally experiences 7/10 pain however it usually does not reach higher than 3 or 4/10. Current limitations are pain and altered gait mechanics secondary to remaining deficits in knee extension ROM and functional strength. Therapist updated HEP and discussed goals/POC. At this time she continues to benefit from skilled PT to address her limitations, but her frequency will be decreased to 2x/week with focus on improving knee ROM, functional strength and balance.   Rehab Potential Good   PT Frequency 2x / week   PT Duration 4 weeks   PT Treatment/Interventions ADLs/Self Care Home Management;Cryotherapy;Electrical Stimulation;Moist Heat;Balance training;Therapeutic exercise;Therapeutic activities;Functional mobility training;Stair training;Gait training;DME Instruction;Neuromuscular re-education;Patient/family education;Manual techniques;Taping;Passive range of motion;Scar mobilization   PT Next Visit Plan teach hamstring bridge on physioball; Progress open chain strengthening and begin with graded closed chain strengthening of LLE; Continue with narrow stance balance, and hamstrings bridging to help with knee extension.    PT Home Exercise Plan updated with heel slides, LAQ with blue TB, sit to stand, tandem stance, SLS, sidelying hip abduction   Consulted and Agree with Plan of Care Patient      Patient will benefit from skilled therapeutic intervention in order to improve the following deficits and impairments:  Abnormal gait, Decreased range of motion, Difficulty walking, Pain, Decreased balance, Hypomobility, Impaired flexibility, Increased edema, Decreased strength, Decreased mobility, Decreased skin integrity, Decreased activity tolerance,  Decreased scar mobility  Visit Diagnosis: Pain in left knee  Stiffness of left knee, not elsewhere classified  Difficulty in walking, not elsewhere classified     Problem List Patient Active Problem List   Diagnosis Date Noted  . Postoperative anemia due to acute blood loss 02/08/2016  . Primary localized osteoarthritis of left knee   . Primary localized osteoarthritis of right knee 11/03/2015  . DJD (degenerative joint disease) of knee 11/03/2015  . Rheumatoid arthritis ([place])   . PONV (postoperative nausea and vomiting)   . Seizures ([place])   . Fibromyalgia   . Sleep apnea   . Ventricular septal defect   . History of staph infection   . Refractory migraine 08/10/2014  . Complex partial epilepsy ([place]) 08/10/2014  . S/P tonsillectomy and adenoidectomy 04/01/2014  . Panic anxiety syndrome 06/03/2013  . Positive depression screening 06/03/2013  . Headache(784.0) 06/03/2013  . Unspecified cerebral artery occlusion with cerebral infarction 06/03/2013  . Headache 05/29/2013  . Patent foramen ovale  05/29/2013  . Chest pain 05/29/2013  . Other convulsions 05/29/2013  . Dysphagia, unspecified(787.20) 04/17/2013  . Acute bronchitis 03/27/2013  . Persistent headaches 03/26/2013  . CVA (cerebral infarction) history 03/26/2013  . Hypokalemia 03/26/2013  . Leukocytosis 03/26/2013  . Arthritis 03/26/2013  . QT prolongation 03/26/2013  . Stroke ([place]) 03/26/2013  . Recurrent ventral incisional hernia 04/19/2012  . Obstructive sleep apnea 08/22/2010  . RESTLESS LEGS SYNDROME 08/22/2010  . Moderate intermittent asthma without complication 94/00/0505  . ECZEMA 08/22/2010  . Irritable bowel syndrome 06/23/2010  . DIARRHEA 06/23/2010  . NAUSEA WITH VOMITING 11/01/2009  . ABDOMINAL PAIN, LOWER 11/01/2009  . KNEE, ARTHRITIS, DEGEN./OSTEO 11/18/2008  . DERANGEMENT MENISCUS 11/02/2008  . JOINT EFFUSION, KNEE 11/02/2008  . KNEE PAIN 11/02/2008   6:18 PM,03/27/2016 Elly Modena PT,  DPT [facility] [place] [address] [place], [place], [address] Phone: [phone]   Fax:  [phone]  Name: MARIACHRISTINA HOLLE MRN: [id number] Date of Birth: [birth year]

## 2016-03-28 ENCOUNTER — Encounter (HOSPITAL_COMMUNITY): Payer: BC Managed Care – PPO | Admitting: Physical Therapy

## 2016-03-28 DIAGNOSIS — Q210 Ventricular septal defect: Secondary | ICD-10-CM | POA: Insufficient documentation

## 2016-03-29 ENCOUNTER — Ambulatory Visit (HOSPITAL_COMMUNITY): Payer: Medicare Other | Admitting: Physical Therapy

## 2016-03-29 DIAGNOSIS — M25562 Pain in left knee: Secondary | ICD-10-CM | POA: Diagnosis not present

## 2016-03-29 DIAGNOSIS — M25662 Stiffness of left knee, not elsewhere classified: Secondary | ICD-10-CM

## 2016-03-29 DIAGNOSIS — R262 Difficulty in walking, not elsewhere classified: Secondary | ICD-10-CM

## 2016-03-29 NOTE — Therapy (Signed)
[place], [place], [address] Phone: [phone]   Fax:  [phone]  Physical Therapy Treatment  Patient Details  Name: [name] MRN: [id number] Date of Birth: Jan 06, 1961 Referring Provider: Elsie Saas, MD  Encounter Date: 03/29/2016      PT End of Session - 03/29/16 1734    Visit Number 12   Number of Visits 24   Date for PT Re-Evaluation 04/16/16   Authorization Type Medicare/ BCBS   Authorization Time Period 02/24/2016-04/25/2016   Authorization - Visit Number 10   Authorization - Number of Visits 24   PT Start Time 0768   PT Stop Time 1342   PT Time Calculation (min) 34 min   Activity Tolerance Patient tolerated treatment well   Behavior During Therapy [facility] for tasks assessed/performed      Past Medical History  Diagnosis Date  . IBS (irritable bowel syndrome)     mixed  . Allergic rhinitis     uses Flonase daily as needed and takes CLaritin daily  . Eczema   . Plaque psoriasis   . Cerebral vascular malformation     sees dr Arnoldo Morale for monitoring as needed, sees dr lewitt for headaches every 4 months  . Lactose intolerance   . Anxiety     takes Xanax daily as needed  . Asthma     Albuterol inhaler prn;SYmbicort daily  . Depression     takes Citalopram daily  . Rheumatoid arthritis(714.0) 2010    oa and ra;Rhemicade IV every 6wks and Metotrexate weekly  . Nausea     takes Zofran daily as needed  . GERD (gastroesophageal reflux disease)     takes Protonix daily  . Hyperlipidemia     takes Pravastatin daily  . PONV (postoperative nausea and vomiting)   . Shortness of breath     with exertion  . Pneumonia 55yr ago    hx of  . History of bronchitis as a child   . History of migraine     last one 10+yrs ago  . Stroke ([place] 03/26/2013    left sided weakness  . Joint pain   . Joint swelling   . Fibromyalgia   . Diverticulitis at age 55 . History of kidney stones   . Thyroid  cyst   . Insomnia   . History of staph infection 571yrago  . Ventricular septal defect     2014 TEE lists large right to left shunt ASD/PFO (NO VSD mentioned)  . Sleep apnea     study done >5y57yrgo;uses CPAP nightly  . PFO (patent foramen ovale)   . Primary localized osteoarthritis of right knee 11/03/2015  . Primary localized osteoarthritis of left knee   . Spinal headache     patient states that she thinks she had a spinal headache a long time ago  . Seizures ([id number] 03/21/14    takes Depakote daily  . COPD (chronic obstructive pulmonary disease) (HCC)[place]. Stress incontinence   . Hard of hearing   . Postoperative anemia due to acute blood loss 02/08/2016    Past Surgical History  Procedure Laterality Date  . Incontinence surgery  2010    sling done   . Knee arthroscopy  1992    left  . Foot surgery  1999    right ankle  . Kidney stone surgery  2001  . Appendectomy  1981  . Cholecystectomy  1981  . Tubal  ligation  1990  . Total abdominal hysterectomy  2001  . Left foot plating and scarping for arthritis  2011  . Right ear tube insertion  2011  . Incisional hernia repair  05/20/2012    Procedure: HERNIA REPAIR INCISIONAL;  Surgeon: Joyice Faster. Cornett, MD;  Location: [facility];  Service: General;  Laterality: N/A;  . Tee without cardioversion N/A 05/15/2013    Procedure: TRANSESOPHAGEAL ECHOCARDIOGRAM (TEE);  Surgeon: Laverda Page, MD;  Location: [place];  Service: Cardiovascular;  Laterality: N/A;  . Cardiac catheterization  2004  . Colonoscopy    . Thryoid biopsy    . Tonsillectomy and adenoidectomy  04/01/2014  . Tonsillectomy and adenoidectomy Bilateral 04/01/2014    Procedure: BILATERAL TONSILLECTOMY AND ADENOIDECTOMY;  Surgeon: Ascencion Dike, MD;  Location: [place];  Service: ENT;  Laterality: Bilateral;  . Total knee arthroplasty Right 11/03/2015  . Total knee arthroplasty Right 11/03/2015    Procedure: RIGHT TOTAL KNEE ARTHROPLASTY/RIGHT;  Surgeon: Elsie Saas, MD;  Location: [place];  Service: Orthopedics;  Laterality: Right;  . Hernia repair  2006    x2  . Esophagogastroduodenoscopy    . Total knee arthroplasty Left 02/07/2016    Procedure: TOTAL KNEE ARTHROPLASTY;  Surgeon: Elsie Saas, MD;  Location: [place];  Service: Orthopedics;  Laterality: Left;    There were no vitals filed for this visit.      Subjective Assessment - 03/29/16 1312    Subjective Patient arrives a few minutes alte today, reports that she is feeling good with only a silght ache in her knee.    Currently in Pain? Yes   Pain Score 1    Pain Location Knee   Pain Orientation Left   Pain Descriptors / Indicators Aching                         [facility] Adult PT Treatment/Exercise - 03/29/16 0001    Knee/Hip Exercises: Stretches   Active Hamstring Stretch 2 reps;30 seconds   Active Hamstring Stretch Limitations stairs    Knee/Hip Exercises: Seated   Long Arc Quad Left;Strengthening;2 sets;15 reps   Long Arc Quad Weight 5 lbs.   Manual Therapy   Manual Therapy Joint mobilization;Soft tissue mobilization   Manual therapy comments performed separately from all other skilled services    Joint Mobilization patella mobilization superior and inferior directions, grade 3 AP tibia on femur, extension ROM in supine    Soft tissue mobilization hamstring medial and lateral heads                 PT Education - 03/29/16 1734    Education provided No          PT Short Term Goals - 03/27/16 1359    PT SHORT TERM GOAL #1   Title After 2 weeks patient will be independent with TKA protocol HEP in order to further progress with ROM and strength at home.   Status Achieved   PT SHORT TERM GOAL #2   Title After 4 weeks, patient will report L Knee pain < 4/10 on a VAS 75% of time in order to improve tolerance with ther ex and ambulation.    Baseline 7/10 when she rolls over in the bed or riding in the car for long period of time, all other 3-4 or less    Status Partially Met   PT SHORT TERM GOAL #3   Title After 4 weeks patient will demo improved L  knee AAROM to 5-110 degrees in order to improve L knee mobility during funcitonal activities.    Status Achieved           PT Long Term Goals - 03/27/16 1401    PT LONG TERM GOAL #1   Title After 8 weeks, patient will report reduced L knee pain to <3/10 on a VAS in order to improve tolerance with ther ex and ambulation.    Status Not Met   PT LONG TERM GOAL #2   Title After 8 weeks, patient will demo improved L knee AAROM to 0-120 degrees in order to improve stair climbing and leisure activities.    Status Partially Met   PT LONG TERM GOAL #3   Title After 8 weeks, patient will demo improved L quad and HS strength to >4/5 MMT in order to improve performance with outdoor ambulation and transfers.    PT LONG TERM GOAL #4   Title After 8 weks, pt will score <11s on TUG Test to semondtrat eimproved funcitonal strength and activity tolerance to improve safety at home.    Baseline 03/27/16: 9.46 sec without AD   Status Partially Met   PT LONG TERM GOAL #5   Title Pt will demo improved strength evident by their ability to ascend/descend 6" steps with reciprocal pattern and no handrails with no more than supervision, x3 trials.   Time 2   Period Weeks   Status New               Plan - 03/29/16 1735    Clinical Impression Statement Performed quite a bit of manual to patient's knee today, including patella mobilizations, moblizations for knee flexion and extension, and extensive work to hamstrings today to assist in reducing knotting and acheiving greater extension. Continued with [place] eexercise at edge of table today as well, re-introduced hamstring stretches. Patient tolerated session well and reports improved comfort of knee at the end of today's session, however she did remain stiff.    Rehab Potential Good   PT Frequency 2x / week   PT Duration 4 weeks   PT Treatment/Interventions  ADLs/Self Care Home Management;Cryotherapy;Electrical Stimulation;Moist Heat;Balance training;Therapeutic exercise;Therapeutic activities;Functional mobility training;Stair training;Gait training;DME Instruction;Neuromuscular re-education;Patient/family education;Manual techniques;Taping;Passive range of motion;Scar mobilization   PT Next Visit Plan teach hamstring bridge on physioball; Progress open chain strengthening and begin with graded closed chain strengthening of LLE; Continue with narrow stance balance, and hamstrings bridging to help with knee extension.    Consulted and Agree with Plan of Care Patient      Patient will benefit from skilled therapeutic intervention in order to improve the following deficits and impairments:  Abnormal gait, Decreased range of motion, Difficulty walking, Pain, Decreased balance, Hypomobility, Impaired flexibility, Increased edema, Decreased strength, Decreased mobility, Decreased skin integrity, Decreased activity tolerance, Decreased scar mobility  Visit Diagnosis: Pain in left knee  Stiffness of left knee, not elsewhere classified  Difficulty in walking, not elsewhere classified     Problem List Patient Active Problem List   Diagnosis Date Noted  . Postoperative anemia due to acute blood loss 02/08/2016  . Primary localized osteoarthritis of left knee   . Primary localized osteoarthritis of right knee 11/03/2015  . DJD (degenerative joint disease) of knee 11/03/2015  . Rheumatoid arthritis ([place])   . PONV (postoperative nausea and vomiting)   . Seizures ([place])   . Fibromyalgia   . Sleep apnea   . Ventricular septal defect   . History of staph infection   .  Refractory migraine 08/10/2014  . Complex partial epilepsy ([place]) 08/10/2014  . S/P tonsillectomy and adenoidectomy 04/01/2014  . Panic anxiety syndrome 06/03/2013  . Positive depression screening 06/03/2013  . Headache(784.0) 06/03/2013  . Unspecified cerebral artery occlusion with  cerebral infarction 06/03/2013  . Headache 05/29/2013  . Patent foramen ovale 05/29/2013  . Chest pain 05/29/2013  . Other convulsions 05/29/2013  . Dysphagia, unspecified(787.20) 04/17/2013  . Acute bronchitis 03/27/2013  . Persistent headaches 03/26/2013  . CVA (cerebral infarction) history 03/26/2013  . Hypokalemia 03/26/2013  . Leukocytosis 03/26/2013  . Arthritis 03/26/2013  . QT prolongation 03/26/2013  . Stroke ([place]) 03/26/2013  . Recurrent ventral incisional hernia 04/19/2012  . Obstructive sleep apnea 08/22/2010  . RESTLESS LEGS SYNDROME 08/22/2010  . Moderate intermittent asthma without complication 44/73/9584  . ECZEMA 08/22/2010  . Irritable bowel syndrome 06/23/2010  . DIARRHEA 06/23/2010  . NAUSEA WITH VOMITING 11/01/2009  . ABDOMINAL PAIN, LOWER 11/01/2009  . KNEE, ARTHRITIS, DEGEN./OSTEO 11/18/2008  . DERANGEMENT MENISCUS 11/02/2008  . JOINT EFFUSION, KNEE 11/02/2008  . KNEE PAIN 11/02/2008    Deniece Ree PT, DPT [place] [address] [place], [place], [address] Phone: [phone]   Fax:  [phone]  Name: MILLIANA REDDOCH MRN: [id number] Date of Birth: Nov 19, 1960

## 2016-03-30 ENCOUNTER — Encounter (HOSPITAL_COMMUNITY): Payer: BC Managed Care – PPO | Admitting: Physical Therapy

## 2016-04-03 ENCOUNTER — Other Ambulatory Visit: Payer: Self-pay | Admitting: Neurology

## 2016-04-03 ENCOUNTER — Ambulatory Visit (HOSPITAL_COMMUNITY): Payer: Medicare Other | Admitting: Physical Therapy

## 2016-04-03 DIAGNOSIS — M25662 Stiffness of left knee, not elsewhere classified: Secondary | ICD-10-CM

## 2016-04-03 DIAGNOSIS — M25562 Pain in left knee: Secondary | ICD-10-CM

## 2016-04-03 DIAGNOSIS — R262 Difficulty in walking, not elsewhere classified: Secondary | ICD-10-CM

## 2016-04-03 NOTE — Therapy (Signed)
[place], [place], [address] Phone: [phone]   Fax:  [phone]  Physical Therapy Treatment  Patient Details  Name: [name] MRN: [id number] Date of Birth: Dec 18, 1960 Referring Provider: Elsie Saas, MD  Encounter Date: 04/03/2016      PT End of Session - 04/03/16 1438    Visit Number 13   Number of Visits 24   Date for PT Re-Evaluation 04/16/16   Authorization Type Medicare/ BCBS   Authorization Time Period 02/24/2016-04/25/2016   Authorization - Visit Number 13   Authorization - Number of Visits 24   PT Start Time 6269   PT Stop Time 1430   PT Time Calculation (min) 43 min   Activity Tolerance Patient tolerated treatment well   Behavior During Therapy [facility] for tasks assessed/performed      Past Medical History  Diagnosis Date  . IBS (irritable bowel syndrome)     mixed  . Allergic rhinitis     uses Flonase daily as needed and takes CLaritin daily  . Eczema   . Plaque psoriasis   . Cerebral vascular malformation     sees dr Arnoldo Morale for monitoring as needed, sees dr lewitt for headaches every 4 months  . Lactose intolerance   . Anxiety     takes Xanax daily as needed  . Asthma     Albuterol inhaler prn;SYmbicort daily  . Depression     takes Citalopram daily  . Rheumatoid arthritis(714.0) 2010    oa and ra;Rhemicade IV every 6wks and Metotrexate weekly  . Nausea     takes Zofran daily as needed  . GERD (gastroesophageal reflux disease)     takes Protonix daily  . Hyperlipidemia     takes Pravastatin daily  . PONV (postoperative nausea and vomiting)   . Shortness of breath     with exertion  . Pneumonia 62yr ago    hx of  . History of bronchitis as a child   . History of migraine     last one 10+yrs ago  . Stroke ([place] 03/26/2013    left sided weakness  . Joint pain   . Joint swelling   . Fibromyalgia   . Diverticulitis at age 55 . History of kidney stones   . Thyroid  cyst   . Insomnia   . History of staph infection 574yrago  . Ventricular septal defect     2014 TEE lists large right to left shunt ASD/PFO (NO VSD mentioned)  . Sleep apnea     study done >5y35yrgo;uses CPAP nightly  . PFO (patent foramen ovale)   . Primary localized osteoarthritis of right knee 11/03/2015  . Primary localized osteoarthritis of left knee   . Spinal headache     patient states that she thinks she had a spinal headache a long time ago  . Seizures ([id number] 03/21/14    takes Depakote daily  . COPD (chronic obstructive pulmonary disease) (HCC)[place]. Stress incontinence   . Hard of hearing   . Postoperative anemia due to acute blood loss 02/08/2016    Past Surgical History  Procedure Laterality Date  . Incontinence surgery  2010    sling done   . Knee arthroscopy  1992    left  . Foot surgery  1999    right ankle  . Kidney stone surgery  2001  . Appendectomy  1981  . Cholecystectomy  1981  . Tubal  ligation  1990  . Total abdominal hysterectomy  2001  . Left foot plating and scarping for arthritis  2011  . Right ear tube insertion  2011  . Incisional hernia repair  05/20/2012    Procedure: HERNIA REPAIR INCISIONAL;  Surgeon: Joyice Faster. Cornett, MD;  [facility];  Service: General;  Laterality: N/A;  . Tee without cardioversion N/A 05/15/2013    Procedure: TRANSESOPHAGEAL ECHOCARDIOGRAM (TEE);  Surgeon: Laverda Page, MD;  Location: [place];  Service: Cardiovascular;  Laterality: N/A;  . Cardiac catheterization  2004  . Colonoscopy    . Thryoid biopsy    . Tonsillectomy and adenoidectomy  04/01/2014  . Tonsillectomy and adenoidectomy Bilateral 04/01/2014    Procedure: BILATERAL TONSILLECTOMY AND ADENOIDECTOMY;  Surgeon: Ascencion Dike, MD;  Location: [place];  Service: ENT;  Laterality: Bilateral;  . Total knee arthroplasty Right 11/03/2015  . Total knee arthroplasty Right 11/03/2015    Procedure: RIGHT TOTAL KNEE ARTHROPLASTY/RIGHT;  Surgeon: Elsie Saas, MD;  Location: [place];  Service: Orthopedics;  Laterality: Right;  . Hernia repair  2006    x2  . Esophagogastroduodenoscopy    . Total knee arthroplasty Left 02/07/2016    Procedure: TOTAL KNEE ARTHROPLASTY;  Surgeon: Elsie Saas, MD;  Location: [place];  Service: Orthopedics;  Laterality: Left;    There were no vitals filed for this visit.      Subjective Assessment - 04/03/16 1348    Subjective Pt states she is doing good. She forgot her HEP printout at her last session with me and would like another copy. She has minor pain in the back of her knee with full extension, but no other complaints.    Patient Stated Goals Pt's main goal is to improve her strength, improve her general mobility, and return to leisure activities including hiking and swimming   Currently in Pain? No/denies                         [facility] Adult PT Treatment/Exercise - 04/03/16 0001    Knee/Hip Exercises: Stretches   Gastroc Stretch Both;2 reps;30 seconds   Soleus Stretch Both;2 reps;30 seconds   Knee/Hip Exercises: Machines for Strengthening   Total Gym Leg Press L 32 x15 with BLE, x10 each with single leg    Knee/Hip Exercises: Seated   Long Arc Quad 20 reps;Left;2 sets   [employer] Weight 5 lbs.   Knee/Hip Exercises: Supine   Other Supine Knee/Hip Exercises staight leg bridge on physioball x20 reps                PT Education - 04/03/16 1434    Education provided Yes   Education Details updated HEP; encouraged daily walking program   Person(s) Educated Patient   Methods Explanation;Demonstration;Handout   Comprehension Verbalized understanding;Returned demonstration          PT Short Term Goals - 03/27/16 1359    PT SHORT TERM GOAL #1   Title After 2 weeks patient will be independent with TKA protocol HEP in order to further progress with ROM and strength at home.   Status Achieved   PT SHORT TERM GOAL #2   Title After 4 weeks, patient will report L Knee pain  < 4/10 on a VAS 75% of time in order to improve tolerance with ther ex and ambulation.    Baseline 7/10 when she rolls over in the bed or riding in the car for long period of  time, all other 3-4 or less   Status Partially Met   PT SHORT TERM GOAL #3   Title After 4 weeks patient will demo improved L knee AAROM to 5-110 degrees in order to improve L knee mobility during funcitonal activities.    Status Achieved           PT Long Term Goals - 03/27/16 1401    PT LONG TERM GOAL #1   Title After 8 weeks, patient will report reduced L knee pain to <3/10 on a VAS in order to improve tolerance with ther ex and ambulation.    Status Not Met   PT LONG TERM GOAL #2   Title After 8 weeks, patient will demo improved L knee AAROM to 0-120 degrees in order to improve stair climbing and leisure activities.    Status Partially Met   PT LONG TERM GOAL #3   Title After 8 weeks, patient will demo improved L quad and HS strength to >4/5 MMT in order to improve performance with outdoor ambulation and transfers.    PT LONG TERM GOAL #4   Title After 8 weks, pt will score <11s on TUG Test to semondtrat eimproved funcitonal strength and activity tolerance to improve safety at home.    Baseline 03/27/16: 9.46 sec without AD   Status Partially Met   PT LONG TERM GOAL #5   Title Pt will demo improved strength evident by their ability to ascend/descend 6" steps with reciprocal pattern and no handrails with no more than supervision, x3 trials.   Time 2   Period Weeks   Status New               Plan - 04/03/16 1440    Clinical Impression Statement Today's session focused on therex to address LLE flexibility and strength. Pt reporting she was able to go for a long walk this weekend with reported soreness. She continues to demonstrate quad weakness and increased difficulty with eccentric control of her knee, in both the Rt and LLE. Will continue with current POC.   Rehab Potential Good   PT Frequency 2x  / week   PT Duration 4 weeks   PT Treatment/Interventions ADLs/Self Care Home Management;Cryotherapy;Electrical Stimulation;Moist Heat;Balance training;Therapeutic exercise;Therapeutic activities;Functional mobility training;Stair training;Gait training;DME Instruction;Neuromuscular re-education;Patient/family education;Manual techniques;Taping;Passive range of motion;Scar mobilization   PT Next Visit Plan Progress open chain strengthening and graded closed chain therex; balance activity    PT Home Exercise Plan updated with heel slides, LAQ with blue TB, sit to stand, tandem stance, SLS, sidelying hip abduction, gastroc/soleus stretch   Consulted and Agree with Plan of Care Patient      Patient will benefit from skilled therapeutic intervention in order to improve the following deficits and impairments:  Abnormal gait, Decreased range of motion, Difficulty walking, Pain, Decreased balance, Hypomobility, Impaired flexibility, Increased edema, Decreased strength, Decreased mobility, Decreased skin integrity, Decreased activity tolerance, Decreased scar mobility  Visit Diagnosis: Pain in left knee  Stiffness of left knee, not elsewhere classified  Difficulty in walking, not elsewhere classified     Problem List Patient Active Problem List   Diagnosis Date Noted  . Postoperative anemia due to acute blood loss 02/08/2016  . Primary localized osteoarthritis of left knee   . Primary localized osteoarthritis of right knee 11/03/2015  . DJD (degenerative joint disease) of knee 11/03/2015  . Rheumatoid arthritis ([place])   . PONV (postoperative nausea and vomiting)   . Seizures ([place])   . Fibromyalgia   .  Sleep apnea   . Ventricular septal defect   . History of staph infection   . Refractory migraine 08/10/2014  . Complex partial epilepsy ([place]) 08/10/2014  . S/P tonsillectomy and adenoidectomy 04/01/2014  . Panic anxiety syndrome 06/03/2013  . Positive depression screening 06/03/2013  .  Headache(784.0) 06/03/2013  . Unspecified cerebral artery occlusion with cerebral infarction 06/03/2013  . Headache 05/29/2013  . Patent foramen ovale 05/29/2013  . Chest pain 05/29/2013  . Other convulsions 05/29/2013  . Dysphagia, unspecified(787.20) 04/17/2013  . Acute bronchitis 03/27/2013  . Persistent headaches 03/26/2013  . CVA (cerebral infarction) history 03/26/2013  . Hypokalemia 03/26/2013  . Leukocytosis 03/26/2013  . Arthritis 03/26/2013  . QT prolongation 03/26/2013  . Stroke ([place]) 03/26/2013  . Recurrent ventral incisional hernia 04/19/2012  . Obstructive sleep apnea 08/22/2010  . RESTLESS LEGS SYNDROME 08/22/2010  . Moderate intermittent asthma without complication 48/47/2072  . ECZEMA 08/22/2010  . Irritable bowel syndrome 06/23/2010  . DIARRHEA 06/23/2010  . NAUSEA WITH VOMITING 11/01/2009  . ABDOMINAL PAIN, LOWER 11/01/2009  . KNEE, ARTHRITIS, DEGEN./OSTEO 11/18/2008  . DERANGEMENT MENISCUS 11/02/2008  . JOINT EFFUSION, KNEE 11/02/2008  . KNEE PAIN 11/02/2008   3:07 PM,04/03/2016 Elly Modena PT, DPT [facility] [place] [address] [place], [place], [address] Phone: [phone]   Fax:  [phone]  Name: [name] MRN: [id number] Date of Birth: 11-10-1960

## 2016-04-04 ENCOUNTER — Ambulatory Visit (HOSPITAL_COMMUNITY): Payer: Medicare Other

## 2016-04-04 DIAGNOSIS — M6281 Muscle weakness (generalized): Secondary | ICD-10-CM

## 2016-04-04 DIAGNOSIS — M25562 Pain in left knee: Secondary | ICD-10-CM

## 2016-04-04 DIAGNOSIS — M25662 Stiffness of left knee, not elsewhere classified: Secondary | ICD-10-CM

## 2016-04-04 DIAGNOSIS — R262 Difficulty in walking, not elsewhere classified: Secondary | ICD-10-CM

## 2016-04-04 NOTE — Therapy (Signed)
[facility] [place], [place], [address] Phone: [phone]   Fax:  [phone]  Physical Therapy Treatment  Patient Details  Name: [name] MRN: [id number] Date of Birth: 04/15/1961 Referring Provider: Elsie Saas, MD  Encounter Date: 04/04/2016      PT End of Session - 04/04/16 1348    Visit Number 14   Number of Visits 24   Date for PT Re-Evaluation 04/16/16   Authorization Type Medicare/ BCBS   Authorization Time Period 02/24/2016-04/25/2016   Authorization - Visit Number 14   Authorization - Number of Visits 24   PT Start Time 6659   PT Stop Time 1400   PT Time Calculation (min) 45 min   Activity Tolerance Patient tolerated treatment well      Past Medical History  Diagnosis Date  . IBS (irritable bowel syndrome)     mixed  . Allergic rhinitis     uses Flonase daily as needed and takes CLaritin daily  . Eczema   . Plaque psoriasis   . Cerebral vascular malformation     sees dr Arnoldo Morale for monitoring as needed, sees dr lewitt for headaches every 4 months  . Lactose intolerance   . Anxiety     takes Xanax daily as needed  . Asthma     Albuterol inhaler prn;SYmbicort daily  . Depression     takes Citalopram daily  . Rheumatoid arthritis(714.0) 2010    oa and ra;Rhemicade IV every 6wks and Metotrexate weekly  . Nausea     takes Zofran daily as needed  . GERD (gastroesophageal reflux disease)     takes Protonix daily  . Hyperlipidemia     takes Pravastatin daily  . PONV (postoperative nausea and vomiting)   . Shortness of breath     with exertion  . Pneumonia 52yr ago    hx of  . History of bronchitis as a child   . History of migraine     last one 10+yrs ago  . [place]    left sided weakness  . Joint pain   . Joint swelling   . Fibromyalgia   . Diverticulitis at age 55 . History of kidney stones   . Thyroid cyst   . Insomnia   . History of staph infection 573yrago   . Ventricular septal defect     2014 TEE lists large right to left shunt ASD/PFO (NO VSD mentioned)  . Sleep apnea     study done >5y79yrgo;uses CPAP nightly  . PFO (patent foramen ovale)   . Primary localized osteoarthritis of right knee 11/03/2015  . Primary localized osteoarthritis of left knee   . Spinal headache     patient states that she thinks she had a spinal headache a long time ago  . [id number] 03/21/14    takes Depakote daily  . COPD (chronic obstructive pulmonary disease) (HCC)[place]. Stress incontinence   . Hard of hearing   . Postoperative anemia due to acute blood loss 02/08/2016    Past Surgical History  Procedure Laterality Date  . Incontinence surgery  2010    sling done   . Knee arthroscopy  1992    left  . Foot surgery  1999    right ankle  . Kidney stone surgery  2001  . Appendectomy  1981  . Cholecystectomy  1981  . Tubal ligation  1990  . Total abdominal hysterectomy  2001  . Left foot plating and scarping for arthritis  2011  . Right ear tube insertion  2011  . Incisional hernia repair  05/20/2012    Procedure: HERNIA REPAIR INCISIONAL;  Surgeon: Joyice Faster. Cornett, MD;  [facility]: [facility];  Service: General;  Laterality: N/A;  . Tee without cardioversion N/A 05/15/2013    Procedure: TRANSESOPHAGEAL ECHOCARDIOGRAM (TEE);  Surgeon: Laverda Page, MD;  Location: [facility];  Service: Cardiovascular;  Laterality: N/A;  . Cardiac catheterization  2004  . Colonoscopy    . Thryoid biopsy    . Tonsillectomy and adenoidectomy  04/01/2014  . Tonsillectomy and adenoidectomy Bilateral 04/01/2014    Procedure: BILATERAL TONSILLECTOMY AND ADENOIDECTOMY;  Surgeon: Ascencion Dike, MD;  Location: [facility];  Service: ENT;  Laterality: Bilateral;  . Total knee arthroplasty Right 11/03/2015  . Total knee arthroplasty Right 11/03/2015    Procedure: RIGHT TOTAL KNEE ARTHROPLASTY/RIGHT;  Surgeon: Elsie Saas, MD;  Location: [facility];  Service: Orthopedics;  Laterality:  Right;  . Hernia repair  2006    x2  . Esophagogastroduodenoscopy    . Total knee arthroplasty Left 02/07/2016    Procedure: TOTAL KNEE ARTHROPLASTY;  Surgeon: Elsie Saas, MD;  Location: [facility];  Service: Orthopedics;  Laterality: Left;    There were no vitals filed for this visit.      Subjective Assessment - 04/04/16 1323    Subjective Pt reports she is doing well today, feeling ok after her session yesterday. No other updates at this time.    Currently in Pain? No/denies  just soreness.                          [facility] Adult PT Treatment/Exercise - 04/04/16 0001    Knee/Hip Exercises: Stretches   Other Knee/Hip Stretches Lunge stretch in chair, towel under foot for DF stretch  1x20 bilat   Knee/Hip Exercises: Seated   Long Arc Quad 3 sets;Weights;Left  3x8 c7.5lb   Long Arc Quad Weight --  7.5lbs   Knee/Hip Exercises: Supine   Bridges with Clamshell 10 reps;3 sets  straight knee HS bridge on Bosu, 3x8 narrow   Heel Prop for Knee Extension Other (comment)  HS bridge on Green physio ball, 2x10   Other Supine Knee/Hip Exercises staight leg bridge on physioball x20 reps             Balance Exercises - 04/04/16 1408    Balance Exercises: Standing   Tandem Stance Eyes open;2 reps;10 secs  bilat, limited due to fatigue           PT Education - 04/03/16 1434    Education provided Yes   Education Details updated HEP; encouraged daily walking program   Person(s) Educated Patient   Methods Explanation;Demonstration;Handout   Comprehension Verbalized understanding;Returned demonstration          PT Short Term Goals - 03/27/16 1359    PT SHORT TERM GOAL #1   Title After 2 weeks patient will be independent with TKA protocol HEP in order to further progress with ROM and strength at home.   Status Achieved   PT SHORT TERM GOAL #2   Title After 4 weeks, patient will report L Knee pain < 4/10 on a VAS 75% of time in order to improve tolerance with  ther ex and ambulation.    Baseline 7/10 when she rolls over in the bed or riding in the car for long period of time, all  other 3-4 or less   Status Partially Met   PT SHORT TERM GOAL #3   Title After 4 weeks patient will demo improved L knee AAROM to 5-110 degrees in order to improve L knee mobility during funcitonal activities.    Status Achieved           PT Long Term Goals - 03/27/16 1401    PT LONG TERM GOAL #1   Title After 8 weeks, patient will report reduced L knee pain to <3/10 on a VAS in order to improve tolerance with ther ex and ambulation.    Status Not Met   PT LONG TERM GOAL #2   Title After 8 weeks, patient will demo improved L knee AAROM to 0-120 degrees in order to improve stair climbing and leisure activities.    Status Partially Met   PT LONG TERM GOAL #3   Title After 8 weeks, patient will demo improved L quad and HS strength to >4/5 MMT in order to improve performance with outdoor ambulation and transfers.    PT LONG TERM GOAL #4   Title After 8 weks, pt will score <11s on TUG Test to semondtrat eimproved funcitonal strength and activity tolerance to improve safety at home.    Baseline 03/27/16: 9.46 sec without AD   Status Partially Met   PT LONG TERM GOAL #5   Title Pt will demo improved strength evident by their ability to ascend/descend 6" steps with reciprocal pattern and no handrails with no more than supervision, x3 trials.   Time 2   Period Weeks   Status New               Plan - 04/04/16 1409    Clinical Impression Statement Pt making excellent progress toward goals. She continues to improve tolerance to HS bridges as adduction range is increased and surface stability is decreased. Pt and PT reviewed addtional stepping exercises she can perform at home to progress closed chain strengthening. Left knee if now less painful and more stable than rigt during all functional and balance activity.    Rehab Potential Good   PT Frequency 2x / week    PT Duration 4 weeks   PT Treatment/Interventions ADLs/Self Care Home Management;Cryotherapy;Electrical Stimulation;Moist Heat;Balance training;Therapeutic exercise;Therapeutic activities;Functional mobility training;Stair training;Gait training;DME Instruction;Neuromuscular re-education;Patient/family education;Manual techniques;Taping;Passive range of motion;Scar mobilization   PT Next Visit Plan Progress open chain strengthening and graded closed chain therex; continue to progress tandem stance balance activity static progressing to dynamic.    Consulted and Agree with Plan of Care Patient      Patient will benefit from skilled therapeutic intervention in order to improve the following deficits and impairments:  Abnormal gait, Decreased range of motion, Difficulty walking, Pain, Decreased balance, Hypomobility, Impaired flexibility, Increased edema, Decreased strength, Decreased mobility, Decreased skin integrity, Decreased activity tolerance, Decreased scar mobility  Visit Diagnosis: Pain in left knee  Stiffness of left knee, not elsewhere classified  Difficulty in walking, not elsewhere classified  Muscle weakness     Problem List Patient Active Problem List   Diagnosis Date Noted  . Postoperative anemia due to acute blood loss 02/08/2016  . Primary localized osteoarthritis of left knee   . Primary localized osteoarthritis of right knee 11/03/2015  . DJD (degenerative joint disease) of knee 11/03/2015  . Rheumatoid arthritis ([place])   . PONV (postoperative nausea and vomiting)   . Seizures ([place])   . Fibromyalgia   . Sleep apnea   . Ventricular septal defect   .  History of staph infection   . Refractory migraine 08/10/2014  . Complex partial epilepsy ([place]) 08/10/2014  . S/P tonsillectomy and adenoidectomy 04/01/2014  . Panic anxiety syndrome 06/03/2013  . Positive depression screening 06/03/2013  . Headache(784.0) 06/03/2013  . Unspecified cerebral artery occlusion with  cerebral infarction 06/03/2013  . Headache 05/29/2013  . Patent foramen ovale 05/29/2013  . Chest pain 05/29/2013  . Other convulsions 05/29/2013  . Dysphagia, unspecified(787.20) 04/17/2013  . Acute bronchitis 03/27/2013  . Persistent headaches 03/26/2013  . CVA (cerebral infarction) history 03/26/2013  . Hypokalemia 03/26/2013  . Leukocytosis 03/26/2013  . Arthritis 03/26/2013  . QT prolongation 03/26/2013  . Stroke ([place]) 03/26/2013  . Recurrent ventral incisional hernia 04/19/2012  . Obstructive sleep apnea 08/22/2010  . RESTLESS LEGS SYNDROME 08/22/2010  . Moderate intermittent asthma without complication 39/35/9409  . ECZEMA 08/22/2010  . Irritable bowel syndrome 06/23/2010  . DIARRHEA 06/23/2010  . NAUSEA WITH VOMITING 11/01/2009  . ABDOMINAL PAIN, LOWER 11/01/2009  . KNEE, ARTHRITIS, DEGEN./OSTEO 11/18/2008  . DERANGEMENT MENISCUS 11/02/2008  . JOINT EFFUSION, KNEE 11/02/2008  . KNEE PAIN 11/02/2008    2:13 PM, 04/04/2016 Etta Grandchild, PT, DPT PRN Physical Therapist at [facility] # [id number] [phone] (office)     [facility] [place], [place], [address] Phone: [phone]   Fax:  [phone]  Name: [name] MRN: [id number] Date of Birth: 02/10/1961

## 2016-04-05 NOTE — Progress Notes (Signed)
Patient sent letter to schedule appt with [facility] to continue receiving refills.

## 2016-04-06 ENCOUNTER — Ambulatory Visit (HOSPITAL_COMMUNITY): Payer: Medicare Other | Admitting: Physical Therapy

## 2016-04-06 DIAGNOSIS — M25562 Pain in left knee: Secondary | ICD-10-CM | POA: Diagnosis not present

## 2016-04-06 DIAGNOSIS — R262 Difficulty in walking, not elsewhere classified: Secondary | ICD-10-CM

## 2016-04-06 DIAGNOSIS — M25662 Stiffness of left knee, not elsewhere classified: Secondary | ICD-10-CM

## 2016-04-06 DIAGNOSIS — M6281 Muscle weakness (generalized): Secondary | ICD-10-CM

## 2016-04-06 NOTE — Therapy (Signed)
[facility] [place], [place], [address] Phone: [phone]   Fax:  [phone]  Physical Therapy Treatment  Patient Details  Name: [name] MRN: [id number] Date of Birth: 1961/09/12 Referring Provider: Elsie Saas, MD  Encounter Date: 04/06/2016      PT End of Session - 04/06/16 1317    Visit Number 15   Number of Visits 24   Date for PT Re-Evaluation 04/16/16   Authorization Type Medicare/ BCBS   Authorization Time Period 02/24/2016-04/25/2016   Authorization - Visit Number 15   Authorization - Number of Visits 24   PT Start Time 1497   PT Stop Time 1346   PT Time Calculation (min) 40 min   Equipment Utilized During Treatment Gait belt   Activity Tolerance Patient tolerated treatment well   Behavior During Therapy [facility] for tasks assessed/performed      Past Medical History  Diagnosis Date  . IBS (irritable bowel syndrome)     mixed  . Allergic rhinitis     uses Flonase daily as needed and takes CLaritin daily  . Eczema   . Plaque psoriasis   . Cerebral vascular malformation     sees Gwendolyn [name] for monitoring as needed, sees [name] for headaches every 4 months  . Lactose intolerance   . Anxiety     takes Xanax daily as needed  . Asthma     Albuterol inhaler prn;SYmbicort daily  . Depression     takes Citalopram daily  . Rheumatoid arthritis(714.0) 2010    oa and ra;Rhemicade IV every 6wks and Metotrexate weekly  . Nausea     takes Zofran daily as needed  . GERD (gastroesophageal reflux disease)     takes Protonix daily  . Hyperlipidemia     takes Pravastatin daily  . PONV (postoperative nausea and vomiting)   . Shortness of breath     with exertion  . Pneumonia 37yr ago    hx of  . History of bronchitis as a child   . History of migraine     last one 10+yrs ago  . Stroke ([name]    left sided weakness  . Joint pain   . Joint swelling   . Fibromyalgia   . Diverticulitis at  age 55 . History of kidney stones   . Thyroid cyst   . Insomnia   . History of staph infection 566yrago  . Ventricular septal defect     2014 TEE lists large right to left shunt ASD/PFO (NO VSD mentioned)  . Sleep apnea     study done >5y53yrgo;uses CPAP nightly  . PFO (patent foramen ovale)   . Primary localized osteoarthritis of right knee 11/03/2015  . Primary localized osteoarthritis of left knee   . Spinal headache     patient states that she thinks she had a spinal headache a long time ago  . Seizures ([name]    takes Depakote daily  . COPD (chronic obstructive pulmonary disease) (HCC)[place]. Stress incontinence   . Hard of hearing   . Postoperative anemia due to acute blood loss 02/08/2016    Past Surgical History  Procedure Laterality Date  . Incontinence surgery  2010    sling done   . Knee arthroscopy  1992    left  . Foot surgery  1999    right ankle  . Kidney stone surgery  2001  . Appendectomy  1981  .  Cholecystectomy  1981  . Tubal ligation  1990  . Total abdominal hysterectomy  2001  . Left foot plating and scarping for arthritis  2011  . Right ear tube insertion  2011  . Incisional hernia repair  05/20/2012    Procedure: HERNIA REPAIR INCISIONAL;  Surgeon: Gwendolyn Faster. Cornett, MD;  Location[facility];  Service: General;  Laterality: N/A;  . Tee without cardioversion N/A 05/15/2013    Procedure: TRANSESOPHAGEAL ECHOCARDIOGRAM (TEE);  Surgeon: Gwendolyn Page, MD;  Location: [facility];  Service: Cardiovascular;  Laterality: N/A;  . Cardiac catheterization  2004  . Colonoscopy    . Thryoid biopsy    . Tonsillectomy and adenoidectomy  04/01/2014  . Tonsillectomy and adenoidectomy Bilateral 04/01/2014    Procedure: BILATERAL TONSILLECTOMY AND ADENOIDECTOMY;  Surgeon: Ascencion Dike, MD;  Location: [facility];  Service: ENT;  Laterality: Bilateral;  . Total knee arthroplasty Right 11/03/2015  . Total knee arthroplasty Right 11/03/2015    Procedure: RIGHT  TOTAL KNEE ARTHROPLASTY/RIGHT;  Surgeon: Gwendolyn Saas, MD;  Location: [facility];  Service: Orthopedics;  Laterality: Right;  . Hernia repair  2006    x2  . Esophagogastroduodenoscopy    . Total knee arthroplasty Left 02/07/2016    Procedure: TOTAL KNEE ARTHROPLASTY;  Surgeon: Gwendolyn Saas, MD;  Location: [facility];  Service: Orthopedics;  Laterality: Left;    There were no vitals filed for this visit.      Subjective Assessment - 04/06/16 1309    Subjective Pt reports she is doing good. She has been doing her HEP regularly. Notices minor aching in her Rt glute region.   Currently in Pain? No/denies                         [facility] Adult PT Treatment/Exercise - 04/06/16 0001    Ambulation/Gait   Ambulation/Gait Yes   Ambulation Distance (Feet) 226 Feet   Gait Comments visual/verbal cues to increase LLE dorsiflexion, and decrease circumduction   Knee/Hip Exercises: Machines for Strengthening   Total Gym Leg Press L34 x20 reps with BLE, SL squat x15 each   tactile cues to prevent knee valgus   Knee/Hip Exercises: Seated   Long Arc Quad Both;3 sets;20 reps   Long Arc Quad Weight 10 lbs.             Balance Exercises - 04/06/16 1333    Balance Exercises: Standing   Tandem Stance Eyes open;Foam/compliant surface;2 reps;10 secs;15 secs  increased difficulty noted with RLE forward           PT Education - 04/06/16 1314    Education provided Yes   Education Details encouraged proper heel/toe pattern during ambulation; updated HEP   Person(s) Educated Patient   Methods Explanation;Demonstration   Comprehension Verbalized understanding;Returned demonstration          PT Short Term Goals - 03/27/16 1359    PT SHORT TERM GOAL #1   Title After 2 weeks patient will be independent with TKA protocol HEP in order to further progress with ROM and strength at home.   Status Achieved   PT SHORT TERM GOAL #2   Title After 4 weeks, patient will report L Knee pain < 4/10  on a VAS 75% of time in order to improve tolerance with ther ex and ambulation.    Baseline 7/10 when she rolls over in the bed or riding in the car for long period of time, all other 3-4 or less  Status Partially Met   PT SHORT TERM GOAL #3   Title After 4 weeks patient will demo improved L knee AAROM to 5-110 degrees in order to improve L knee mobility during funcitonal activities.    Status Achieved           PT Long Term Goals - 03/27/16 1401    PT LONG TERM GOAL #1   Title After 8 weeks, patient will report reduced L knee pain to <3/10 on a VAS in order to improve tolerance with ther ex and ambulation.    Status Not Met   PT LONG TERM GOAL #2   Title After 8 weeks, patient will demo improved L knee AAROM to 0-120 degrees in order to improve stair climbing and leisure activities.    Status Partially Met   PT LONG TERM GOAL #3   Title After 8 weeks, patient will demo improved L quad and HS strength to >4/5 MMT in order to improve performance with outdoor ambulation and transfers.    PT LONG TERM GOAL #4   Title After 8 weks, pt will score <11s on TUG Test to semondtrat eimproved funcitonal strength and activity tolerance to improve safety at home.    Baseline 03/27/16: 9.46 sec without AD   Status Partially Met   PT LONG TERM GOAL #5   Title Pt will demo improved strength evident by their ability to ascend/descend 6" steps with reciprocal pattern and no handrails with no more than supervision, x3 trials.   Time 2   Period Weeks   Status New               Plan - 04/06/16 1355    Clinical Impression Statement Today's session focused on continued therex to target LE strength. Pt was able to tolerate all progressions to exercise with increased reps and resistance without report of pain. Ended the session reviewing gait mechanics with verbal cues to improve toe off/heel contact portions of her gait. She was able to demonstrate improvement in pattern by the end of the  session, however this will need to be further addressed at her next session.   Rehab Potential Good   PT Frequency 2x / week   PT Duration 4 weeks   PT Treatment/Interventions ADLs/Self Care Home Management;Cryotherapy;Electrical Stimulation;Moist Heat;Balance training;Therapeutic exercise;Therapeutic activities;Functional mobility training;Stair training;Gait training;DME Instruction;Neuromuscular re-education;Patient/family education;Manual techniques;Taping;Passive range of motion;Scar mobilization   PT Next Visit Plan progress closed chain strengthening; continue to progress tandem stance balance activity static progressing to dynamic.    PT Home Exercise Plan updated with heel slides, LAQ with blue TB, sit to stand, tandem stance, SLS, sidelying hip abduction, gastroc/soleus stretch: need to update next visit   Recommended Other Services None   Consulted and Agree with Plan of Care Patient      Patient will benefit from skilled therapeutic intervention in order to improve the following deficits and impairments:  Abnormal gait, Decreased range of motion, Difficulty walking, Pain, Decreased balance, Hypomobility, Impaired flexibility, Increased edema, Decreased strength, Decreased mobility, Decreased skin integrity, Decreased activity tolerance, Decreased scar mobility  Visit Diagnosis: Stiffness of left knee, not elsewhere classified  Difficulty in walking, not elsewhere classified  Pain in left knee  Muscle weakness     Problem List Patient Active Problem List   Diagnosis Date Noted  . Postoperative anemia due to acute blood loss 02/08/2016  . Primary localized osteoarthritis of left knee   . Primary localized osteoarthritis of right knee 11/03/2015  . DJD (degenerative  joint disease) of knee 11/03/2015  . Rheumatoid arthritis ([place])   . PONV (postoperative nausea and vomiting)   . Seizures ([place])   . Fibromyalgia   . Sleep apnea   . Ventricular septal defect   . History of  staph infection   . Refractory migraine 08/10/2014  . Complex partial epilepsy ([place]) 08/10/2014  . S/P tonsillectomy and adenoidectomy 04/01/2014  . Panic anxiety syndrome 06/03/2013  . Positive depression screening 06/03/2013  . Headache(784.0) 06/03/2013  . Unspecified cerebral artery occlusion with cerebral infarction 06/03/2013  . Headache 05/29/2013  . Patent foramen ovale 05/29/2013  . Chest pain 05/29/2013  . Other convulsions 05/29/2013  . Dysphagia, unspecified(787.20) 04/17/2013  . Acute bronchitis 03/27/2013  . Persistent headaches 03/26/2013  . CVA (cerebral infarction) history 03/26/2013  . Hypokalemia 03/26/2013  . Leukocytosis 03/26/2013  . Arthritis 03/26/2013  . QT prolongation 03/26/2013  . Stroke ([place]) 03/26/2013  . Recurrent ventral incisional hernia 04/19/2012  . Obstructive sleep apnea 08/22/2010  . RESTLESS LEGS SYNDROME 08/22/2010  . Moderate intermittent asthma without complication 69/43/7005  . ECZEMA 08/22/2010  . Irritable bowel syndrome 06/23/2010  . DIARRHEA 06/23/2010  . NAUSEA WITH VOMITING 11/01/2009  . ABDOMINAL PAIN, LOWER 11/01/2009  . KNEE, ARTHRITIS, DEGEN./OSTEO 11/18/2008  . DERANGEMENT MENISCUS 11/02/2008  . JOINT EFFUSION, KNEE 11/02/2008  . KNEE PAIN 11/02/2008    2:01 PM,04/06/2016 Elly Modena PT, DPT [facility] [address] [place], [place], [address] Phone: [phone]   Fax:  [phone]  Name: [name] MRN: [id number] Date of Birth: 10/27/1960

## 2016-04-10 ENCOUNTER — Ambulatory Visit (HOSPITAL_COMMUNITY): Payer: Medicare Other | Admitting: Physical Therapy

## 2016-04-10 ENCOUNTER — Telehealth (HOSPITAL_COMMUNITY): Payer: Self-pay | Admitting: Physical Therapy

## 2016-04-10 NOTE — Telephone Encounter (Signed)
She just hurt her back by lifting her foot to high and she is in a lot of pain. She will rest and do heat today, if you have any other suggestions call her and let her know. Nf

## 2016-04-11 ENCOUNTER — Encounter (HOSPITAL_COMMUNITY): Payer: BC Managed Care – PPO

## 2016-04-14 ENCOUNTER — Encounter (HOSPITAL_COMMUNITY): Payer: BC Managed Care – PPO

## 2016-04-17 ENCOUNTER — Other Ambulatory Visit: Payer: Self-pay | Admitting: Surgery

## 2016-04-17 ENCOUNTER — Ambulatory Visit (HOSPITAL_COMMUNITY): Payer: Medicare Other | Attending: Orthopedic Surgery | Admitting: Physical Therapy

## 2016-04-17 DIAGNOSIS — M25662 Stiffness of left knee, not elsewhere classified: Secondary | ICD-10-CM | POA: Insufficient documentation

## 2016-04-17 DIAGNOSIS — M25562 Pain in left knee: Secondary | ICD-10-CM | POA: Diagnosis present

## 2016-04-17 DIAGNOSIS — R109 Unspecified abdominal pain: Secondary | ICD-10-CM

## 2016-04-17 DIAGNOSIS — R262 Difficulty in walking, not elsewhere classified: Secondary | ICD-10-CM | POA: Diagnosis present

## 2016-04-17 DIAGNOSIS — M6281 Muscle weakness (generalized): Secondary | ICD-10-CM | POA: Insufficient documentation

## 2016-04-17 NOTE — Therapy (Signed)
[place], [place], [address] Phone: [phone]   Fax:  [phone]  Physical Therapy Treatment  Patient Details  Name: [name] MRN: [id number] Date of Birth: 13-Mar-1961 Referring Provider: Elsie Saas, MD  Encounter Date: 04/17/2016      PT End of Session - 04/17/16 1433    Visit Number 16   Number of Visits 24   Date for PT Re-Evaluation 04/16/16   Authorization Type Medicare/ BCBS   Authorization Time Period 02/24/2016-04/25/2016   Authorization - Visit Number 16   Authorization - Number of Visits 24   PT Start Time 6295   PT Stop Time 1430   PT Time Calculation (min) 43 min   Equipment Utilized During Treatment Gait belt   Activity Tolerance Patient tolerated treatment well   Behavior During Therapy [facility] for tasks assessed/performed      Past Medical History  Diagnosis Date  . IBS (irritable bowel syndrome)     mixed  . Allergic rhinitis     uses Flonase daily as needed and takes CLaritin daily  . Eczema   . Plaque psoriasis   . Cerebral vascular malformation     sees dr Arnoldo Morale for monitoring as needed, sees dr lewitt for headaches every 4 months  . Lactose intolerance   . Anxiety     takes Xanax daily as needed  . Asthma     Albuterol inhaler prn;SYmbicort daily  . Depression     takes Citalopram daily  . Rheumatoid arthritis(714.0) 2010    oa and ra;Rhemicade IV every 6wks and Metotrexate weekly  . Nausea     takes Zofran daily as needed  . GERD (gastroesophageal reflux disease)     takes Protonix daily  . Hyperlipidemia     takes Pravastatin daily  . PONV (postoperative nausea and vomiting)   . Shortness of breath     with exertion  . Pneumonia 38yr ago    hx of  . History of bronchitis as a child   . History of migraine     last one 10+yrs ago  . Stroke ([place] 03/26/2013    left sided weakness  . Joint pain   . Joint swelling   . Fibromyalgia   . Diverticulitis at  age 55 . History of kidney stones   . Thyroid cyst   . Insomnia   . History of staph infection 573yrago  . Ventricular septal defect     2014 TEE lists large right to left shunt ASD/PFO (NO VSD mentioned)  . Sleep apnea     study done >5y59yrgo;uses CPAP nightly  . PFO (patent foramen ovale)   . Primary localized osteoarthritis of right knee 11/03/2015  . Primary localized osteoarthritis of left knee   . Spinal headache     patient states that she thinks she had a spinal headache a long time ago  . Seizures ([facility]21month[facility] 03/21/14    takes Depakote daily  . COPD (chronic obstructive pulmonary disease) (HCC)[place]. Stress incontinence   . Hard of hearing   . Postoperative anemia due to acute blood loss 02/08/2016    Past Surgical History  Procedure Laterality Date  . Incontinence surgery  2010    sling done   . Knee arthroscopy  1992    left  . Foot surgery  1999    right ankle  . Kidney stone surgery  2001  . Appendectomy  1981  .  Cholecystectomy  1981  . Tubal ligation  1990  . Total abdominal hysterectomy  2001  . Left foot plating and scarping for arthritis  2011  . Right ear tube insertion  2011  . Incisional hernia repair  05/20/2012    Procedure: HERNIA REPAIR INCISIONAL;  Surgeon: Joyice Faster. Cornett, MD;  Location: [facility];  Service: General;  Laterality: N/A;  . Tee without cardioversion N/A 05/15/2013    Procedure: TRANSESOPHAGEAL ECHOCARDIOGRAM (TEE);  Surgeon: Laverda Page, MD;  Location: [place];  Service: Cardiovascular;  Laterality: N/A;  . Cardiac catheterization  2004  . Colonoscopy    . Thryoid biopsy    . Tonsillectomy and adenoidectomy  04/01/2014  . Tonsillectomy and adenoidectomy Bilateral 04/01/2014    Procedure: BILATERAL TONSILLECTOMY AND ADENOIDECTOMY;  Surgeon: Ascencion Dike, MD;  Location: [place];  Service: ENT;  Laterality: Bilateral;  . Total knee arthroplasty Right 11/03/2015  . Total knee arthroplasty Right 11/03/2015    Procedure: RIGHT  TOTAL KNEE ARTHROPLASTY/RIGHT;  Surgeon: Elsie Saas, MD;  Location: [place];  Service: Orthopedics;  Laterality: Right;  . Hernia repair  2006    x2  . Esophagogastroduodenoscopy    . Total knee arthroplasty Left 02/07/2016    Procedure: TOTAL KNEE ARTHROPLASTY;  Surgeon: Elsie Saas, MD;  Location: [place];  Service: Orthopedics;  Laterality: Left;    There were no vitals filed for this visit.      Subjective Assessment - 04/17/16 1350    Subjective Pt states thigns are going well. She bought tennis shoes and feels they fit pretty good. Overall, she has minimal pain. She hasn't been taking too much pain medicine lately.   Pertinent History CVA, DJD, RA, chest pain, and seizures    Patient Stated Goals Pt's main goal is to improve her strength, improve her general mobility, and return to leisure activities including hiking and swimming                         [facility] Adult PT Treatment/Exercise - 04/17/16 0001    Knee/Hip Exercises: Machines for Strengthening   Total Gym Leg Press L34, x20 reps, increased depth of squat to 90deg   Knee/Hip Exercises: Standing   Forward Step Up Both;2 sets;Step Height: 6";Hand Hold: 2;20 reps;Hand Hold: 1   Wall Squat 15 reps  decreased hip/knee flexion noted    Knee/Hip Exercises: Supine   Bridges with Clamshell 15 reps;Both             Balance Exercises - 04/17/16 1421    Balance Exercises: Standing   Tandem Stance Eyes open;Foam/compliant surface;2 reps;30 secs  trunk rotation with blue weight ball x5 reps each   SLS Eyes open;2 reps;15 secs;Solid surface           PT Education - 04/17/16 1432    Education provided Yes   Education Details updated HEP   Person(s) Educated Patient   Methods Explanation;Demonstration;Handout   Comprehension Verbalized understanding;Returned demonstration          PT Short Term Goals - 03/27/16 1359    PT SHORT TERM GOAL #1   Title After 2 weeks patient will be independent with  TKA protocol HEP in order to further progress with ROM and strength at home.   Status Achieved   PT SHORT TERM GOAL #2   Title After 4 weeks, patient will report L Knee pain < 4/10 on a VAS 75% of time in order to improve tolerance  with ther ex and ambulation.    Baseline 7/10 when she rolls over in the bed or riding in the car for long period of time, all other 3-4 or less   Status Partially Met   PT SHORT TERM GOAL #3   Title After 4 weeks patient will demo improved L knee AAROM to 5-110 degrees in order to improve L knee mobility during funcitonal activities.    Status Achieved           PT Long Term Goals - 03/27/16 1401    PT LONG TERM GOAL #1   Title After 8 weeks, patient will report reduced L knee pain to <3/10 on a VAS in order to improve tolerance with ther ex and ambulation.    Status Not Met   PT LONG TERM GOAL #2   Title After 8 weeks, patient will demo improved L knee AAROM to 0-120 degrees in order to improve stair climbing and leisure activities.    Status Partially Met   PT LONG TERM GOAL #3   Title After 8 weeks, patient will demo improved L quad and HS strength to >4/5 MMT in order to improve performance with outdoor ambulation and transfers.    PT LONG TERM GOAL #4   Title After 8 weks, pt will score <11s on TUG Test to semondtrat eimproved funcitonal strength and activity tolerance to improve safety at home.    Baseline 03/27/16: 9.46 sec without AD   Status Partially Met   PT LONG TERM GOAL #5   Title Pt will demo improved strength evident by their ability to ascend/descend 6" steps with reciprocal pattern and no handrails with no more than supervision, x3 trials.   Time 2   Period Weeks   Status New               Plan - 04/17/16 1434    Clinical Impression Statement Pt has made good progress towards her goals. She has been performing her HEP regularly and has noticed less pain throughout the day. Noted improved technique with stair negotiation and  increased difficulty with eccentric control of LLE. Focused on closed chain strengthening and balance activity with good tolerance and no report of pain by the end of the session.   Rehab Potential Good   PT Frequency 2x / week   PT Duration 4 weeks   PT Treatment/Interventions ADLs/Self Care Home Management;Cryotherapy;Electrical Stimulation;Moist Heat;Balance training;Therapeutic exercise;Therapeutic activities;Functional mobility training;Stair training;Gait training;DME Instruction;Neuromuscular re-education;Patient/family education;Manual techniques;Taping;Passive range of motion;Scar mobilization   PT Next Visit Plan progress closed chain strengthening; continue to progress tandem stance balance activity static progressing to dynamic.    PT Home Exercise Plan updated with step ups, heel raises, calf stretch, wall squats and straight leg bridge   Recommended Other Services none    Consulted and Agree with Plan of Care Patient      Patient will benefit from skilled therapeutic intervention in order to improve the following deficits and impairments:  Abnormal gait, Decreased range of motion, Difficulty walking, Pain, Decreased balance, Hypomobility, Impaired flexibility, Increased edema, Decreased strength, Decreased mobility, Decreased skin integrity, Decreased activity tolerance, Decreased scar mobility  Visit Diagnosis: Stiffness of left knee, not elsewhere classified  Difficulty in walking, not elsewhere classified  Pain in left knee  Muscle weakness     Problem List Patient Active Problem List   Diagnosis Date Noted  . Postoperative anemia due to acute blood loss 02/08/2016  . Primary localized osteoarthritis of left knee   .  Primary localized osteoarthritis of right knee 11/03/2015  . DJD (degenerative joint disease) of knee 11/03/2015  . Rheumatoid arthritis ([place])   . PONV (postoperative nausea and vomiting)   . Seizures ([place])   . Fibromyalgia   . Sleep apnea   .  Ventricular septal defect   . History of staph infection   . Refractory migraine 08/10/2014  . Complex partial epilepsy ([place]) 08/10/2014  . S/P tonsillectomy and adenoidectomy 04/01/2014  . Panic anxiety syndrome 06/03/2013  . Positive depression screening 06/03/2013  . Headache(784.0) 06/03/2013  . Unspecified cerebral artery occlusion with cerebral infarction 06/03/2013  . Headache 05/29/2013  . Patent foramen ovale 05/29/2013  . Chest pain 05/29/2013  . Other convulsions 05/29/2013  . Dysphagia, unspecified(787.20) 04/17/2013  . Acute bronchitis 03/27/2013  . Persistent headaches 03/26/2013  . CVA (cerebral infarction) history 03/26/2013  . Hypokalemia 03/26/2013  . Leukocytosis 03/26/2013  . Arthritis 03/26/2013  . QT prolongation 03/26/2013  . Stroke ([place]) 03/26/2013  . Recurrent ventral incisional hernia 04/19/2012  . Obstructive sleep apnea 08/22/2010  . RESTLESS LEGS SYNDROME 08/22/2010  . Moderate intermittent asthma without complication 83/37/4451  . ECZEMA 08/22/2010  . Irritable bowel syndrome 06/23/2010  . DIARRHEA 06/23/2010  . NAUSEA WITH VOMITING 11/01/2009  . ABDOMINAL PAIN, LOWER 11/01/2009  . KNEE, ARTHRITIS, DEGEN./OSTEO 11/18/2008  . DERANGEMENT MENISCUS 11/02/2008  . JOINT EFFUSION, KNEE 11/02/2008  . KNEE PAIN 11/02/2008   2:38 PM,04/17/2016 Elly Modena PT, DPT [facility] [place] [address] [place], [place], [address] Phone: [phone]   Fax:  [phone]  Name: [name] MRN: [id number] Date of Birth: 1960-11-25

## 2016-04-19 ENCOUNTER — Ambulatory Visit (HOSPITAL_COMMUNITY): Payer: Medicare Other | Admitting: Physical Therapy

## 2016-04-20 ENCOUNTER — Encounter (HOSPITAL_COMMUNITY): Payer: BC Managed Care – PPO | Admitting: Physical Therapy

## 2016-04-21 ENCOUNTER — Encounter (HOSPITAL_COMMUNITY): Payer: BC Managed Care – PPO | Admitting: Physical Therapy

## 2016-04-24 ENCOUNTER — Ambulatory Visit
Admission: RE | Admit: 2016-04-24 | Discharge: 2016-04-24 | Disposition: A | Discharge status: Home / Self Care | Payer: Medicare Other | Source: Ambulatory Visit | Attending: Surgery | Admitting: Surgery

## 2016-04-24 ENCOUNTER — Ambulatory Visit (HOSPITAL_COMMUNITY): Payer: Medicare Other | Admitting: Physical Therapy

## 2016-04-24 DIAGNOSIS — M25662 Stiffness of left knee, not elsewhere classified: Secondary | ICD-10-CM | POA: Diagnosis not present

## 2016-04-24 DIAGNOSIS — R109 Unspecified abdominal pain: Secondary | ICD-10-CM

## 2016-04-24 DIAGNOSIS — M25562 Pain in left knee: Secondary | ICD-10-CM

## 2016-04-24 DIAGNOSIS — R262 Difficulty in walking, not elsewhere classified: Secondary | ICD-10-CM

## 2016-04-24 MED ORDER — IOPAMIDOL (ISOVUE-300) INJECTION 61%
100.0000 mL | Freq: Once | INTRAVENOUS | Status: AC | PRN
  Administered 2016-04-24: 100 mL via INTRAVENOUS

## 2016-04-24 NOTE — Therapy (Signed)
[place], [place], [address] Phone: [phone]   Fax:  [phone]  Physical Therapy Treatment/Reassessment  Patient Details  Name: [name] MRN: [id number] Date of Birth: 06-24-1961 Referring Provider: Elsie Saas, MD  Encounter Date: 04/24/2016      PT End of Session - 04/24/16 1556    Visit Number 17  g codes done 17th visit   Number of Visits 25   Date for PT Re-Evaluation 04/16/16   Authorization Type Medicare/ BCBS   Authorization Time Period 02/24/2016-04/25/2016 NEW: 04/26/16 to 05/26/16   Authorization - Visit Number 32   Authorization - Number of Visits 25   PT Start Time 5597   PT Stop Time 1600   PT Time Calculation (min) 43 min   Equipment Utilized During Treatment Gait belt   Activity Tolerance Patient tolerated treatment well   Behavior During Therapy [facility] for tasks assessed/performed      Past Medical History  Diagnosis Date  . IBS (irritable bowel syndrome)     mixed  . Allergic rhinitis     uses Flonase daily as needed and takes CLaritin daily  . Eczema   . Plaque psoriasis   . Cerebral vascular malformation     sees dr Arnoldo Morale for monitoring as needed, sees dr lewitt for headaches every 4 months  . Lactose intolerance   . Anxiety     takes Xanax daily as needed  . Asthma     Albuterol inhaler prn;SYmbicort daily  . Depression     takes Citalopram daily  . Rheumatoid arthritis(714.0) 2010    oa and ra;Rhemicade IV every 6wks and Metotrexate weekly  . Nausea     takes Zofran daily as needed  . GERD (gastroesophageal reflux disease)     takes Protonix daily  . Hyperlipidemia     takes Pravastatin daily  . PONV (postoperative nausea and vomiting)   . Shortness of breath     with exertion  . Pneumonia 57yr ago    hx of  . History of bronchitis as a child   . History of migraine     last one 10+yrs ago  . [place]    left sided weakness  . Joint  pain   . Joint swelling   . Fibromyalgia   . Diverticulitis at age 55 . History of kidney stones   . Thyroid cyst   . Insomnia   . History of staph infection 564yrago  . Ventricular septal defect     2014 TEE lists large right to left shunt ASD/PFO (NO VSD mentioned)  . Sleep apnea     study done >5y19yrgo;uses CPAP nightly  . PFO (patent foramen ovale)   . Primary localized osteoarthritis of right knee 11/03/2015  . Primary localized osteoarthritis of left knee   . Spinal headache     patient states that she thinks she had a spinal headache a long time ago  . Seizures (HCCHoltmo31montho 03/21/14    takes Depakote daily  . COPD (chronic obstructive pulmonary disease) (HCC)[place]. Stress incontinence   . Hard of hearing   . Postoperative anemia due to acute blood loss 02/08/2016    Past Surgical History  Procedure Laterality Date  . Incontinence surgery  2010    sling done   . Knee arthroscopy  1992    left  . Foot surgery  1999    right ankle  .  Kidney stone surgery  2001  . Appendectomy  1981  . Cholecystectomy  1981  . Tubal ligation  1990  . Total abdominal hysterectomy  2001  . Left foot plating and scarping for arthritis  2011  . Right ear tube insertion  2011  . Incisional hernia repair  05/20/2012    Procedure: HERNIA REPAIR INCISIONAL;  Surgeon: Joyice Faster. Cornett, MD;  [facility];  Service: General;  Laterality: N/A;  . Tee without cardioversion N/A 05/15/2013    Procedure: TRANSESOPHAGEAL ECHOCARDIOGRAM (TEE);  Surgeon: Laverda Page, MD;  Location: [place];  Service: Cardiovascular;  Laterality: N/A;  . Cardiac catheterization  2004  . Colonoscopy    . Thryoid biopsy    . Tonsillectomy and adenoidectomy  04/01/2014  . Tonsillectomy and adenoidectomy Bilateral 04/01/2014    Procedure: BILATERAL TONSILLECTOMY AND ADENOIDECTOMY;  Surgeon: Ascencion Dike, MD;  Location: [place];  Service: ENT;  Laterality: Bilateral;  . Total knee arthroplasty Right 11/03/2015  .  Total knee arthroplasty Right 11/03/2015    Procedure: RIGHT TOTAL KNEE ARTHROPLASTY/RIGHT;  Surgeon: Elsie Saas, MD;  Location: [place];  Service: Orthopedics;  Laterality: Right;  . Hernia repair  2006    x2  . Esophagogastroduodenoscopy    . Total knee arthroplasty Left 02/07/2016    Procedure: TOTAL KNEE ARTHROPLASTY;  Surgeon: Elsie Saas, MD;  Location: [place];  Service: Orthopedics;  Laterality: Left;    There were no vitals filed for this visit.      Subjective Assessment - 04/24/16 1524    Subjective Pt states things are going good. She feels she has made 75% improvement since beginning PT. She was able to work on stairs alot this past week going up and down. Her knees don't usually bother her too much, but occasionally she notices they ache.    Patient Stated Goals Pt's main goal is to improve her strength, improve her general mobility, and return to leisure activities including hiking and swimming   Currently in Pain? No/denies            [facility] PT Assessment - 04/24/16 0001    Assessment   Medical Diagnosis s/p L TKA    Referring Provider Elsie Saas, MD   Onset Date/Surgical Date 01/22/16   Hand Dominance Left   Next MD Visit unsure   Prior Therapy Home health physical therapy post op    Restrictions   Weight Bearing Restrictions No   Balance Screen   Has the patient fallen in the past 6 months No   Has the patient had a decrease in activity level because of a fear of falling?  No   Is the patient reluctant to leave their home because of a fear of falling?  No   Home [place] residence   Living Arrangements Spouse/significant other   Available Help at Discharge Family   Type of [place] to enter   Entrance Stairs-Number of Steps 3   [place] One level   [place] - 2 wheels;Cane - single point;Other (comment);Bedside commode;Shower seat   Prior Function    Level of Independence Needs assistance with homemaking;Independent with household mobility with device;Independent with basic ADLs   Observation/Other Assessments-Edema    Edema --  None noted   Sensation   Light Touch Appears Intact   PROM   Left Knee Extension 3   Left Knee Flexion 115  Timed Up and Go Test   Normal TUG (seconds) 9.4                     [facility] Adult PT Treatment/Exercise - 04/24/16 0001    Knee/Hip Exercises: Machines for Strengthening   Total Gym Leg Press L34, BLE with red TB around knees x15 reps; SL squat                PT Education - 04/24/16 1555    Education provided Yes   Education Details discussed goals/POC; shift of focus to balance and strengthening progression   Person(s) Educated Patient   Methods Explanation   Comprehension Verbalized understanding          PT Short Term Goals - 04/24/16 1536    PT SHORT TERM GOAL #1   Title After 2 weeks patient will be independent with TKA protocol HEP in order to further progress with ROM and strength at home.   Status Achieved   PT SHORT TERM GOAL #2   Title After 4 weeks, patient will report L Knee pain < 4/10 on a VAS 75% of time in order to improve tolerance with ther ex and ambulation.    Baseline 7/10 when she rolls over in the bed or riding in the car for long period of time, all other 3-4 or less; 5/10 max, but not enough for her to take pain meds   Status Partially Met   PT SHORT TERM GOAL #3   Title After 4 weeks patient will demo improved L knee AAROM to 5-110 degrees in order to improve L knee mobility during funcitonal activities.    Status Achieved           PT Long Term Goals - 04/24/16 1538    PT LONG TERM GOAL #1   Title After 8 weeks, patient will report reduced L knee pain to <3/10 on a VAS in order to improve tolerance with ther ex and ambulation.    Status Not Met   PT LONG TERM GOAL #2   Title After 8 weeks, patient will demo improved L knee AAROM to  0-120 degrees in order to improve stair climbing and leisure activities.    Baseline 3-115 deg   Status Partially Met   PT LONG TERM GOAL #3   Title After 8 weeks, patient will demo improved L quad and HS strength to >4/5 MMT in order to improve performance with outdoor ambulation and transfers.    Baseline flexion 4/5, extension 5/5   Status Achieved   PT LONG TERM GOAL #4   Title After 8 weks, pt will score <11s on TUG Test to demonstrate improved funcitonal strength and activity tolerance to improve safety at home.    Baseline 03/27/16: 9.46 sec without AD   Status Achieved   PT LONG TERM GOAL #5   Title Pt will demo improved strength evident by their ability to ascend/descend 6" steps with reciprocal pattern and no handrails with no more than supervision, x3 trials.   Baseline 1 handrail and reciprocal pattern (+) knee valgus and poor eccentric control   Time 2   Period Weeks   Status New               Plan - 04/24/16 1612    Clinical Impression Statement Pt was reassessed this visit having met several of her goals and progressed towards others. She is lacking less than 5 deg knee extension and is now ambulating with  much more symmetrical steps and heel strike. She is consistently performing her HEP and feels she has made ~75% improvement in function since beginning PT. Currently her biggest limitation is her LE strength evident by poor eccentric control when descending steps and balance. She would benefit from several more weeks of skilled PT to address her remaining limitations.    Rehab Potential Good   PT Frequency 2x / week   PT Duration 4 weeks   PT Treatment/Interventions ADLs/Self Care Home Management;Cryotherapy;Electrical Stimulation;Moist Heat;Balance training;Therapeutic exercise;Therapeutic activities;Functional mobility training;Stair training;Gait training;DME Instruction;Neuromuscular re-education;Patient/family education;Manual techniques;Taping;Passive range of  motion;Scar mobilization   PT Next Visit Plan progress closed chain strengthening; continue to progress tandem stance balance activity static progressing to dynamic.    PT Home Exercise Plan updated with step ups, heel raises, calf stretch, wall squats and straight leg bridge   Recommended Other Services none    Consulted and Agree with Plan of Care Patient      Patient will benefit from skilled therapeutic intervention in order to improve the following deficits and impairments:  Abnormal gait, Decreased range of motion, Difficulty walking, Pain, Decreased balance, Hypomobility, Impaired flexibility, Increased edema, Decreased strength, Decreased mobility, Decreased skin integrity, Decreased activity tolerance, Decreased scar mobility  Visit Diagnosis: Stiffness of left knee, not elsewhere classified  Difficulty in walking, not elsewhere classified  Pain in left knee       G-Codes - 04/29/2016 1558    Functional Assessment Tool Used Clinical judgement based on ROM, functional strength and mobility   Functional Limitation Mobility: Walking and moving around   Mobility: Walking and Moving Around Current Status [phone]) At least 20 percent but less than 40 percent impaired, limited or restricted   Mobility: Walking and Moving Around Goal Status [phone]) At least 20 percent but less than 40 percent impaired, limited or restricted      Problem List Patient Active Problem List   Diagnosis Date Noted  . Postoperative anemia due to acute blood loss 02/08/2016  . Primary localized osteoarthritis of left knee   . Primary localized osteoarthritis of right knee 11/03/2015  . DJD (degenerative joint disease) of knee 11/03/2015  . Rheumatoid arthritis ([place])   . PONV (postoperative nausea and vomiting)   . Seizures ([place])   . Fibromyalgia   . Sleep apnea   . Ventricular septal defect   . History of staph infection   . Refractory migraine 08/10/2014  . Complex partial epilepsy ([place]) 08/10/2014   . S/P tonsillectomy and adenoidectomy 04/01/2014  . Panic anxiety syndrome 06/03/2013  . Positive depression screening 06/03/2013  . Headache(784.0) 06/03/2013  . Unspecified cerebral artery occlusion with cerebral infarction 06/03/2013  . Headache 05/29/2013  . Patent foramen ovale 05/29/2013  . Chest pain 05/29/2013  . Other convulsions 05/29/2013  . Dysphagia, unspecified(787.20) 04/17/2013  . Acute bronchitis 03/27/2013  . Persistent headaches 03/26/2013  . CVA (cerebral infarction) history 03/26/2013  . Hypokalemia 03/26/2013  . Leukocytosis 03/26/2013  . Arthritis 03/26/2013  . QT prolongation 03/26/2013  . Stroke ([place]) 03/26/2013  . Recurrent ventral incisional hernia 04/19/2012  . Obstructive sleep apnea 08/22/2010  . RESTLESS LEGS SYNDROME 08/22/2010  . Moderate intermittent asthma without complication 15/72/6203  . ECZEMA 08/22/2010  . Irritable bowel syndrome 06/23/2010  . DIARRHEA 06/23/2010  . NAUSEA WITH VOMITING 11/01/2009  . ABDOMINAL PAIN, LOWER 11/01/2009  . KNEE, ARTHRITIS, DEGEN./OSTEO 11/18/2008  . DERANGEMENT MENISCUS 11/02/2008  . JOINT EFFUSION, KNEE 11/02/2008  . KNEE PAIN 11/02/2008   4:32 PM,2016-04-29 Clarise Cruz  Honor Junes, DPT [facility] [place] [address] [place], [place], [address] Phone: [phone]   Fax:  [phone]  Name: VERNIDA MCNICHOLAS MRN: [id number] Date of Birth: [birth year]

## 2016-04-27 ENCOUNTER — Telehealth (HOSPITAL_COMMUNITY): Payer: Self-pay

## 2016-04-27 ENCOUNTER — Ambulatory Visit (HOSPITAL_COMMUNITY): Payer: Medicare Other | Admitting: Physical Therapy

## 2016-04-27 NOTE — Telephone Encounter (Signed)
04/27/16 cx because she said she had to go into court

## 2016-05-01 ENCOUNTER — Ambulatory Visit (HOSPITAL_COMMUNITY): Payer: Medicare Other | Admitting: Physical Therapy

## 2016-05-01 DIAGNOSIS — M25662 Stiffness of left knee, not elsewhere classified: Secondary | ICD-10-CM | POA: Diagnosis not present

## 2016-05-01 DIAGNOSIS — M25562 Pain in left knee: Secondary | ICD-10-CM

## 2016-05-01 DIAGNOSIS — R262 Difficulty in walking, not elsewhere classified: Secondary | ICD-10-CM

## 2016-05-01 NOTE — Therapy (Signed)
[place], [place], [address] Phone: [phone]   Fax:  [phone]  Physical Therapy Treatment  Patient Details  Name: [name] MRN: [id number] Date of Birth: 29-Apr-1961 Referring Provider: Elsie Saas, MD  Encounter Date: 05/01/2016      PT End of Session - 05/01/16 1607    Visit Number 18  g codes done 17th visit   Number of Visits 25   Date for PT Re-Evaluation 04/16/16   Authorization Type Medicare/ BCBS   Authorization Time Period 02/24/2016-04/25/2016 NEW: 04/26/16 to 05/26/16   Authorization - Visit Number 18   Authorization - Number of Visits 25   PT Start Time 1600   PT Stop Time 1643   PT Time Calculation (min) 43 min   Equipment Utilized During Treatment Gait belt   Activity Tolerance Patient tolerated treatment well   Behavior During Therapy [facility] for tasks assessed/performed      Past Medical History  Diagnosis Date  . IBS (irritable bowel syndrome)     mixed  . Allergic rhinitis     uses Flonase daily as needed and takes CLaritin daily  . Eczema   . Plaque psoriasis   . Cerebral vascular malformation     sees dr Arnoldo Morale for monitoring as needed, sees dr lewitt for headaches every 4 months  . Lactose intolerance   . Anxiety     takes Xanax daily as needed  . Asthma     Albuterol inhaler prn;SYmbicort daily  . Depression     takes Citalopram daily  . Rheumatoid arthritis(714.0) 2010    oa and ra;Rhemicade IV every 6wks and Metotrexate weekly  . Nausea     takes Zofran daily as needed  . GERD (gastroesophageal reflux disease)     takes Protonix daily  . Hyperlipidemia     takes Pravastatin daily  . PONV (postoperative nausea and vomiting)   . Shortness of breath     with exertion  . Pneumonia 51yr ago    hx of  . History of bronchitis as a child   . History of migraine     last one 10+yrs ago  . [place]    left sided weakness  . Joint pain   . Joint  swelling   . Fibromyalgia   . Diverticulitis at age 55 . History of kidney stones   . Thyroid cyst   . Insomnia   . History of staph infection 555yrago  . Ventricular septal defect     2014 TEE lists large right to left shunt ASD/PFO (NO VSD mentioned)  . Sleep apnea     study done >5y5yrgo;uses CPAP nightly  . PFO (patent foramen ovale)   . Primary localized osteoarthritis of right knee 11/03/2015  . Primary localized osteoarthritis of left knee   . Spinal headache     patient states that she thinks she had a spinal headache a long time ago  . Seizures ([facility] 03/21/14    takes Depakote daily  . COPD (chronic obstructive pulmonary disease) (HCC)[place]. Stress incontinence   . Hard of hearing   . Postoperative anemia due to acute blood loss 02/08/2016    Past Surgical History  Procedure Laterality Date  . Incontinence surgery  2010    sling done   . Knee arthroscopy  1992    left  . Foot surgery  1999    right ankle  .  Kidney stone surgery  2001  . Appendectomy  1981  . Cholecystectomy  1981  . Tubal ligation  1990  . Total abdominal hysterectomy  2001  . Left foot plating and scarping for arthritis  2011  . Right ear tube insertion  2011  . Incisional hernia repair  05/20/2012    Procedure: HERNIA REPAIR INCISIONAL;  Surgeon: Joyice Faster. Cornett, MD;  [facility];  Service: General;  Laterality: N/A;  . Tee without cardioversion N/A 05/15/2013    Procedure: TRANSESOPHAGEAL ECHOCARDIOGRAM (TEE);  Surgeon: Laverda Page, MD;  Location: [place];  Service: Cardiovascular;  Laterality: N/A;  . Cardiac catheterization  2004  . Colonoscopy    . Thryoid biopsy    . Tonsillectomy and adenoidectomy  04/01/2014  . Tonsillectomy and adenoidectomy Bilateral 04/01/2014    Procedure: BILATERAL TONSILLECTOMY AND ADENOIDECTOMY;  Surgeon: Ascencion Dike, MD;  Location: [place];  Service: ENT;  Laterality: Bilateral;  . Total knee arthroplasty Right 11/03/2015  . Total knee  arthroplasty Right 11/03/2015    Procedure: RIGHT TOTAL KNEE ARTHROPLASTY/RIGHT;  Surgeon: Elsie Saas, MD;  Location: [place];  Service: Orthopedics;  Laterality: Right;  . Hernia repair  2006    x2  . Esophagogastroduodenoscopy    . Total knee arthroplasty Left 02/07/2016    Procedure: TOTAL KNEE ARTHROPLASTY;  Surgeon: Elsie Saas, MD;  Location: [place];  Service: Orthopedics;  Laterality: Left;    There were no vitals filed for this visit.      Subjective Assessment - 05/01/16 1601    Subjective Pt states things are going well. She has no pain, just soreness in her hips.    Patient Stated Goals Pt's main goal is to improve her strength, improve her general mobility, and return to leisure activities including hiking and swimming   Currently in Pain? No/denies                         [facility] Adult PT Treatment/Exercise - 05/01/16 0001    Knee/Hip Exercises: Machines for Strengthening   Total Gym Leg Press L34 x10 reps BLE, x10 reps each (Single leg only)   Knee/Hip Exercises: Standing   Heel Raises 2 sets;10 reps;Right;Left   Heel Raises Limitations single leg    Forward Step Up Both;1 set;15 reps;Hand Hold: 2;Step Height: 8"  contralat hip flexion   Step Down Both;1 set;10 reps;Hand Hold: 2;Step Height: 4"  lateral step down; (+) trendelenburg noted L>R   Wall Squat 10 reps  knee 90 deg, 3 sec hold              Balance Exercises - 05/01/16 1638    Balance Exercises: Standing   Tandem Stance Eyes open;Foam/compliant surface;30 secs;3 reps  Rt: 17 sec, Lt: 23 sec   SLS Eyes open;Solid surface;2 reps;20 secs  Lt: 15 sec max, Rt: 8 sec max           PT Education - 05/01/16 1607    Education provided Yes   Education Details reviewed/updated HEP   Person(s) Educated Patient   Methods Explanation;Demonstration;Handout   Comprehension Verbalized understanding;Returned demonstration          PT Short Term Goals - 04/24/16 1536    PT SHORT TERM  GOAL #1   Title After 2 weeks patient will be independent with TKA protocol HEP in order to further progress with ROM and strength at home.   Status Achieved   PT SHORT TERM GOAL #2  Title After 4 weeks, patient will report L Knee pain < 4/10 on a VAS 75% of time in order to improve tolerance with ther ex and ambulation.    Baseline 7/10 when she rolls over in the bed or riding in the car for long period of time, all other 3-4 or less; 5/10 max, but not enough for her to take pain meds   Status Partially Met   PT SHORT TERM GOAL #3   Title After 4 weeks patient will demo improved L knee AAROM to 5-110 degrees in order to improve L knee mobility during funcitonal activities.    Status Achieved           PT Long Term Goals - 04/24/16 1538    PT LONG TERM GOAL #1   Title After 8 weeks, patient will report reduced L knee pain to <3/10 on a VAS in order to improve tolerance with ther ex and ambulation.    Status Not Met   PT LONG TERM GOAL #2   Title After 8 weeks, patient will demo improved L knee AAROM to 0-120 degrees in order to improve stair climbing and leisure activities.    Baseline 3-115 deg   Status Partially Met   PT LONG TERM GOAL #3   Title After 8 weeks, patient will demo improved L quad and HS strength to >4/5 MMT in order to improve performance with outdoor ambulation and transfers.    Baseline flexion 4/5, extension 5/5   Status Achieved   PT LONG TERM GOAL #4   Title After 8 weks, pt will score <11s on TUG Test to demonstrate improved funcitonal strength and activity tolerance to improve safety at home.    Baseline 03/27/16: 9.46 sec without AD   Status Achieved   PT LONG TERM GOAL #5   Title Pt will demo improved strength evident by their ability to ascend/descend 6" steps with reciprocal pattern and no handrails with no more than supervision, x3 trials.   Baseline 1 handrail and reciprocal pattern (+) knee valgus and poor eccentric control   Time 2   Period Weeks    Status New               Plan - 05/01/16 1647    Clinical Impression Statement Today' session focused on increased reps and resistance with closed chain strengthening. Pt was able to tolerate all increases in therex without report of pain. She is progressing towards her goals with improved SLS up to 15 sec on her LLE. Updated HEP with return of correct demonstration.   Rehab Potential Good   PT Frequency 2x / week   PT Duration 4 weeks   PT Treatment/Interventions ADLs/Self Care Home Management;Cryotherapy;Electrical Stimulation;Moist Heat;Balance training;Therapeutic exercise;Therapeutic activities;Functional mobility training;Stair training;Gait training;DME Instruction;Neuromuscular re-education;Patient/family education;Manual techniques;Taping;Passive range of motion;Scar mobilization   PT Next Visit Plan progress closed chain strengthening; continue to progress tandem stance balance activity static progressing to dynamic.    PT Home Exercise Plan updated with step ups, single leg heel raises, wall squats, tandem/SLS   Recommended Other Services none    Consulted and Agree with Plan of Care Patient      Patient will benefit from skilled therapeutic intervention in order to improve the following deficits and impairments:  Abnormal gait, Decreased range of motion, Difficulty walking, Pain, Decreased balance, Hypomobility, Impaired flexibility, Increased edema, Decreased strength, Decreased mobility, Decreased skin integrity, Decreased activity tolerance, Decreased scar mobility  Visit Diagnosis: Stiffness of left knee, not elsewhere classified  Difficulty in walking, not elsewhere classified  Pain in left knee     Problem List Patient Active Problem List   Diagnosis Date Noted  . Postoperative anemia due to acute blood loss 02/08/2016  . Primary localized osteoarthritis of left knee   . Primary localized osteoarthritis of right knee 11/03/2015  . DJD (degenerative joint  disease) of knee 11/03/2015  . Rheumatoid arthritis ([place])   . PONV (postoperative nausea and vomiting)   . Seizures ([place])   . Fibromyalgia   . Sleep apnea   . Ventricular septal defect   . History of staph infection   . Refractory migraine 08/10/2014  . Complex partial epilepsy ([place]) 08/10/2014  . S/P tonsillectomy and adenoidectomy 04/01/2014  . Panic anxiety syndrome 06/03/2013  . Positive depression screening 06/03/2013  . Headache(784.0) 06/03/2013  . Unspecified cerebral artery occlusion with cerebral infarction 06/03/2013  . Headache 05/29/2013  . Patent foramen ovale 05/29/2013  . Chest pain 05/29/2013  . Other convulsions 05/29/2013  . Dysphagia, unspecified(787.20) 04/17/2013  . Acute bronchitis 03/27/2013  . Persistent headaches 03/26/2013  . CVA (cerebral infarction) history 03/26/2013  . Hypokalemia 03/26/2013  . Leukocytosis 03/26/2013  . Arthritis 03/26/2013  . QT prolongation 03/26/2013  . Stroke ([place]) 03/26/2013  . Recurrent ventral incisional hernia 04/19/2012  . Obstructive sleep apnea 08/22/2010  . RESTLESS LEGS SYNDROME 08/22/2010  . Moderate intermittent asthma without complication 81/59/4707  . ECZEMA 08/22/2010  . Irritable bowel syndrome 06/23/2010  . DIARRHEA 06/23/2010  . NAUSEA WITH VOMITING 11/01/2009  . ABDOMINAL PAIN, LOWER 11/01/2009  . KNEE, ARTHRITIS, DEGEN./OSTEO 11/18/2008  . DERANGEMENT MENISCUS 11/02/2008  . JOINT EFFUSION, KNEE 11/02/2008  . KNEE PAIN 11/02/2008   5:32 PM,05/01/2016 Elly Modena PT, DPT [facility] [place] [address] [place], [place], [address] Phone: [phone]   Fax:  [phone]  Name: [name] MRN: [id number] Date of Birth: [birth year]

## 2016-05-04 ENCOUNTER — Ambulatory Visit (HOSPITAL_COMMUNITY): Payer: Medicare Other | Admitting: Physical Therapy

## 2016-05-04 DIAGNOSIS — M25662 Stiffness of left knee, not elsewhere classified: Secondary | ICD-10-CM

## 2016-05-04 DIAGNOSIS — R262 Difficulty in walking, not elsewhere classified: Secondary | ICD-10-CM

## 2016-05-04 DIAGNOSIS — M25562 Pain in left knee: Secondary | ICD-10-CM

## 2016-05-04 NOTE — Therapy (Signed)
[place], [place], [address] Phone: [phone]   Fax:  [phone]  Physical Therapy Treatment  Patient Details  Name: [name] MRN: [id number] Date of Birth: August 22, 1961 Referring Provider: Elsie Saas, MD  Encounter Date: 05/04/2016      PT End of Session - 05/04/16 1630    Visit Number 19  g codes done 17th visit   Number of Visits 25   Date for PT Re-Evaluation 05/26/16   Authorization Type Medicare/ BCBS   Authorization Time Period 02/24/2016-04/25/2016 NEW: 04/26/16 to 05/26/16   Authorization - Visit Number 35   Authorization - Number of Visits 25   PT Start Time 1627   PT Stop Time 1650   PT Time Calculation (min) 23 min   Equipment Utilized During Treatment Gait belt   Activity Tolerance Patient tolerated treatment well   Behavior During Therapy [facility] for tasks assessed/performed      Past Medical History  Diagnosis Date  . IBS (irritable bowel syndrome)     mixed  . Allergic rhinitis     uses Flonase daily as needed and takes CLaritin daily  . Eczema   . Plaque psoriasis   . Cerebral vascular malformation     sees dr Arnoldo Morale for monitoring as needed, sees dr lewitt for headaches every 4 months  . Lactose intolerance   . Anxiety     takes Xanax daily as needed  . Asthma     Albuterol inhaler prn;SYmbicort daily  . Depression     takes Citalopram daily  . Rheumatoid arthritis(714.0) 2010    oa and ra;Rhemicade IV every 6wks and Metotrexate weekly  . Nausea     takes Zofran daily as needed  . GERD (gastroesophageal reflux disease)     takes Protonix daily  . Hyperlipidemia     takes Pravastatin daily  . PONV (postoperative nausea and vomiting)   . Shortness of breath     with exertion  . Pneumonia 31yr ago    hx of  . History of bronchitis as a child   . History of migraine     last one 10+yrs ago  . [place]    left sided weakness  . Joint pain   . Joint  swelling   . Fibromyalgia   . Diverticulitis at age 55 . History of kidney stones   . Thyroid cyst   . Insomnia   . History of staph infection 533yrago  . Ventricular septal defect     2014 TEE lists large right to left shunt ASD/PFO (NO VSD mentioned)  . Sleep apnea     study done >5y37yrgo;uses CPAP nightly  . PFO (patent foramen ovale)   . Primary localized osteoarthritis of right knee 11/03/2015  . Primary localized osteoarthritis of left knee   . Spinal headache     patient states that she thinks she had a spinal headache a long time ago  . Seizures ([facility] 03/21/14    takes Depakote daily  . COPD (chronic obstructive pulmonary disease) (HCC)[place]. Stress incontinence   . Hard of hearing   . Postoperative anemia due to acute blood loss 02/08/2016    Past Surgical History  Procedure Laterality Date  . Incontinence surgery  2010    sling done   . Knee arthroscopy  1992    left  . Foot surgery  1999    right ankle  .  Kidney stone surgery  2001  . Appendectomy  1981  . Cholecystectomy  1981  . Tubal ligation  1990  . Total abdominal hysterectomy  2001  . Left foot plating and scarping for arthritis  2011  . Right ear tube insertion  2011  . Incisional hernia repair  05/20/2012    Procedure: HERNIA REPAIR INCISIONAL;  Surgeon: Joyice Faster. Cornett, MD;  [facility];  Service: General;  Laterality: N/A;  . Tee without cardioversion N/A 05/15/2013    Procedure: TRANSESOPHAGEAL ECHOCARDIOGRAM (TEE);  Surgeon: Laverda Page, MD;  Location: [place];  Service: Cardiovascular;  Laterality: N/A;  . Cardiac catheterization  2004  . Colonoscopy    . Thryoid biopsy    . Tonsillectomy and adenoidectomy  04/01/2014  . Tonsillectomy and adenoidectomy Bilateral 04/01/2014    Procedure: BILATERAL TONSILLECTOMY AND ADENOIDECTOMY;  Surgeon: Ascencion Dike, MD;  Location: [place];  Service: ENT;  Laterality: Bilateral;  . Total knee arthroplasty Right 11/03/2015  . Total knee  arthroplasty Right 11/03/2015    Procedure: RIGHT TOTAL KNEE ARTHROPLASTY/RIGHT;  Surgeon: Elsie Saas, MD;  Location: [place];  Service: Orthopedics;  Laterality: Right;  . Hernia repair  2006    x2  . Esophagogastroduodenoscopy    . Total knee arthroplasty Left 02/07/2016    Procedure: TOTAL KNEE ARTHROPLASTY;  Surgeon: Elsie Saas, MD;  Location: [place];  Service: Orthopedics;  Laterality: Left;    There were no vitals filed for this visit.      Subjective Assessment - 05/04/16 1628    Subjective Pt states that things are great. She has been working on her exercises without much difficutly                          [place] Adult PT Treatment/Exercise - 05/04/16 0001    Knee/Hip Exercises: Standing   Forward Step Up Both;1 set;15 reps;Hand Hold: 1;Step Height: 8"   Other Standing Knee Exercises squatting in // bars 2x10 reps              Balance Exercises - 05/04/16 1750    Balance Exercises: Standing   Tandem Stance Eyes open;2 reps;20 secs  Lt forward: 32 sec max, Rt forward: 17 sec max   SLS 2 reps;Eyes open;20 secs  x15 sec each   Tandem Gait Forward;Foam/compliant surface;Intermittent upper extremity support;3 reps  CGA   Sidestepping Foam/compliant support;2 reps  several LOB backwards           PT Education - 05/04/16 1752    Education provided Yes   Education Details encouraged pt to continue with adherence to HEP during her vacation to prevent any loss of strength/balance   Person(s) Educated Patient   Methods Explanation   Comprehension Verbalized understanding          PT Short Term Goals - 04/24/16 1536    PT SHORT TERM GOAL #1   Title After 2 weeks patient will be independent with TKA protocol HEP in order to further progress with ROM and strength at home.   Status Achieved   PT SHORT TERM GOAL #2   Title After 4 weeks, patient will report L Knee pain < 4/10 on a VAS 75% of time in order to improve tolerance with ther ex and  ambulation.    Baseline 7/10 when she rolls over in the bed or riding in the car for long period of time, all other 3-4 or less; 5/10 max, but  not enough for her to take pain meds   Status Partially Met   PT SHORT TERM GOAL #3   Title After 4 weeks patient will demo improved L knee AAROM to 5-110 degrees in order to improve L knee mobility during funcitonal activities.    Status Achieved           PT Long Term Goals - 04/24/16 1538    PT LONG TERM GOAL #1   Title After 8 weeks, patient will report reduced L knee pain to <3/10 on a VAS in order to improve tolerance with ther ex and ambulation.    Status Not Met   PT LONG TERM GOAL #2   Title After 8 weeks, patient will demo improved L knee AAROM to 0-120 degrees in order to improve stair climbing and leisure activities.    Baseline 3-115 deg   Status Partially Met   PT LONG TERM GOAL #3   Title After 8 weeks, patient will demo improved L quad and HS strength to >4/5 MMT in order to improve performance with outdoor ambulation and transfers.    Baseline flexion 4/5, extension 5/5   Status Achieved   PT LONG TERM GOAL #4   Title After 8 weks, pt will score <11s on TUG Test to demonstrate improved funcitonal strength and activity tolerance to improve safety at home.    Baseline 03/27/16: 9.46 sec without AD   Status Achieved   PT LONG TERM GOAL #5   Title Pt will demo improved strength evident by their ability to ascend/descend 6" steps with reciprocal pattern and no handrails with no more than supervision, x3 trials.   Baseline 1 handrail and reciprocal pattern (+) knee valgus and poor eccentric control   Time 2   Period Weeks   Status New               Plan - 05/04/16 1633    Clinical Impression Statement Pt arrived late to today's session. Focused on functional therex and balance activity on unstable surfaces. Pt with increased difficulty during tandem gait on foam pad with several LOB. She demonstrates improved squat  technique and depth this visit. She is going out of town for the next week, so I encouraged her to continue with HEP.   Rehab Potential Good   PT Frequency 2x / week   PT Duration 4 weeks   PT Treatment/Interventions ADLs/Self Care Home Management;Cryotherapy;Electrical Stimulation;Moist Heat;Balance training;Therapeutic exercise;Therapeutic activities;Functional mobility training;Stair training;Gait training;DME Instruction;Neuromuscular re-education;Patient/family education;Manual techniques;Taping;Passive range of motion;Scar mobilization   PT Next Visit Plan progress closed chain strengthening; continue to progress tandem stance balance activity static progressing to dynamic.    PT Home Exercise Plan updated with step ups, single leg heel raises, wall squats, tandem/SLS   Consulted and Agree with Plan of Care Patient      Patient will benefit from skilled therapeutic intervention in order to improve the following deficits and impairments:  Abnormal gait, Decreased range of motion, Difficulty walking, Pain, Decreased balance, Hypomobility, Impaired flexibility, Increased edema, Decreased strength, Decreased mobility, Decreased skin integrity, Decreased activity tolerance, Decreased scar mobility  Visit Diagnosis: Stiffness of left knee, not elsewhere classified  Difficulty in walking, not elsewhere classified  Pain in left knee     Problem List Patient Active Problem List   Diagnosis Date Noted  . Postoperative anemia due to acute blood loss 02/08/2016  . Primary localized osteoarthritis of left knee   . Primary localized osteoarthritis of right knee 11/03/2015  .  DJD (degenerative joint disease) of knee 11/03/2015  . Rheumatoid arthritis ([place])   . PONV (postoperative nausea and vomiting)   . Seizures ([place])   . Fibromyalgia   . Sleep apnea   . Ventricular septal defect   . History of staph infection   . Refractory migraine 08/10/2014  . Complex partial epilepsy ([place])  08/10/2014  . S/P tonsillectomy and adenoidectomy 04/01/2014  . Panic anxiety syndrome 06/03/2013  . Positive depression screening 06/03/2013  . Headache(784.0) 06/03/2013  . Unspecified cerebral artery occlusion with cerebral infarction 06/03/2013  . Headache 05/29/2013  . Patent foramen ovale 05/29/2013  . Chest pain 05/29/2013  . Other convulsions 05/29/2013  . Dysphagia, unspecified(787.20) 04/17/2013  . Acute bronchitis 03/27/2013  . Persistent headaches 03/26/2013  . CVA (cerebral infarction) history 03/26/2013  . Hypokalemia 03/26/2013  . Leukocytosis 03/26/2013  . Arthritis 03/26/2013  . QT prolongation 03/26/2013  . Stroke ([place]) 03/26/2013  . Recurrent ventral incisional hernia 04/19/2012  . Obstructive sleep apnea 08/22/2010  . RESTLESS LEGS SYNDROME 08/22/2010  . Moderate intermittent asthma without complication 16/07/9603  . ECZEMA 08/22/2010  . Irritable bowel syndrome 06/23/2010  . DIARRHEA 06/23/2010  . NAUSEA WITH VOMITING 11/01/2009  . ABDOMINAL PAIN, LOWER 11/01/2009  . KNEE, ARTHRITIS, DEGEN./OSTEO 11/18/2008  . DERANGEMENT MENISCUS 11/02/2008  . JOINT EFFUSION, KNEE 11/02/2008  . KNEE PAIN 11/02/2008   5:55 PM,05/04/2016 Elly Modena PT, DPT [facility] [place] [address] [place], [place], [address] Phone: [phone]   Fax:  [phone]  Name: [name] MRN: [id number] Date of Birth: 05-Jul-1961

## 2016-05-05 ENCOUNTER — Other Ambulatory Visit: Payer: Self-pay | Admitting: Neurology

## 2016-05-08 ENCOUNTER — Encounter (HOSPITAL_COMMUNITY): Payer: BC Managed Care – PPO | Admitting: Physical Therapy

## 2016-05-09 NOTE — Telephone Encounter (Signed)
LVM informing patient she needs to call and schedule follow up so her medications can be refilled.  Left name, number.

## 2016-05-10 NOTE — Telephone Encounter (Signed)
No she needs to be seen or get her refills elsewhere

## 2016-05-10 NOTE — Telephone Encounter (Signed)
LVM #2 requesting patient call back to schedule follow up in order to get medication refills. Left name, number.

## 2016-05-11 ENCOUNTER — Encounter (HOSPITAL_COMMUNITY): Payer: BC Managed Care – PPO | Admitting: Physical Therapy

## 2016-05-11 NOTE — Telephone Encounter (Signed)
Per Dr. Pearlean Brownie she needs to make an appt with research to continue refills. Pt was sent a letter in JUne 2017 about her making an appt.

## 2016-05-11 NOTE — Telephone Encounter (Signed)
See.Dr. Pearlean Brownie note.

## 2016-05-15 ENCOUNTER — Ambulatory Visit (HOSPITAL_COMMUNITY): Payer: Medicare Other | Admitting: Physical Therapy

## 2016-05-15 DIAGNOSIS — M6281 Muscle weakness (generalized): Secondary | ICD-10-CM

## 2016-05-15 DIAGNOSIS — R262 Difficulty in walking, not elsewhere classified: Secondary | ICD-10-CM

## 2016-05-15 DIAGNOSIS — M25562 Pain in left knee: Secondary | ICD-10-CM

## 2016-05-15 DIAGNOSIS — M25662 Stiffness of left knee, not elsewhere classified: Secondary | ICD-10-CM

## 2016-05-15 NOTE — Therapy (Signed)
[place], [place], [address] Phone: [phone]   Fax:  [phone]  Physical Therapy Treatment  Patient Details  Name: [name] MRN: [id number] Date of Birth: Jan 31, 1961 Referring Provider: Elsie Saas, MD  Encounter Date: 05/15/2016      PT End of Session - 05/15/16 1732    Visit Number 20  g codes done 17th visit   Number of Visits 25   Date for PT Re-Evaluation 05/26/16   Authorization Type Medicare/ BCBS   Authorization Time Period 02/24/2016-04/25/2016 NEW: 04/26/16 to 05/26/16   Authorization - Visit Number 20   Authorization - Number of Visits 25   PT Start Time 1600   PT Stop Time 1645   PT Time Calculation (min) 45 min   Equipment Utilized During Treatment Gait belt   Activity Tolerance Patient tolerated treatment well   Behavior During Therapy [facility] for tasks assessed/performed      Past Medical History:  Diagnosis Date  . Allergic rhinitis    uses Flonase daily as needed and takes CLaritin daily  . Anxiety    takes Xanax daily as needed  . Asthma    Albuterol inhaler prn;SYmbicort daily  . Cerebral vascular malformation    sees dr Arnoldo Morale for monitoring as needed, sees dr lewitt for headaches every 4 months  . COPD (chronic obstructive pulmonary disease) ([place])   . Depression    takes Citalopram daily  . Diverticulitis at age 41  . Eczema   . Fibromyalgia   . GERD (gastroesophageal reflux disease)    takes Protonix daily  . Hard of hearing   . History of bronchitis as a child   . History of kidney stones   . History of migraine    last one 10+yrs ago  . History of staph infection 81yr ago  . Hyperlipidemia    takes Pravastatin daily  . IBS (irritable bowel syndrome)    mixed  . Insomnia   . Joint pain   . Joint swelling   . Lactose intolerance   . Nausea    takes Zofran daily as needed  . PFO (patent foramen ovale)   . Plaque psoriasis   . Pneumonia 26yrago   hx  of  . PONV (postoperative nausea and vomiting)   . Postoperative anemia due to acute blood loss 02/08/2016  . Primary localized osteoarthritis of left knee   . Primary localized osteoarthritis of right knee 11/03/2015  . Rheumatoid arthritis(714.0) 2010   oa and ra;Rhemicade IV every 6wks and Metotrexate weekly  . Seiz[id number] 03/21/14   takes Depakote daily  . Shortness of breath    with exertion  . Sleep apnea    study done >[id number];uses CPAP nightly  . Spinal headache    patient states that she thinks she had a spinal headache a long time ago  . Stress incontinence   . Stroke (HCC)[place]11/2014   left sided weakness  . Thyroid cyst   . Ventricular septal defect    2014 TEE lists large right to left shunt ASD/PFO (NO VSD mentioned)    Past Surgical History:  Procedure Laterality Date  . APPENDECTOMY  1981  . CARDIAC CATHETERIZATION  2004  . CHOLECYSTECTOMY  1981  . COLONOSCOPY    . ESOPHAGOGASTRODUODENOSCOPY    . FOOT SURGERY  1999   right ankle  . HERNIA REPAIR  2006   x2  . [place]  05/20/2012  Procedure: HERNIA REPAIR INCISIONAL;  Surgeon: Joyice Faster. Cornett, MD;  Location: [facility];  Service: General;  Laterality: N/A;  . INCONTINENCE SURGERY  2010   sling done   . Ward  2001  . KNEE ARTHROSCOPY  1992   left  . left foot plating and scarping for arthritis  2011  . right ear tube insertion  2011  . TEE WITHOUT CARDIOVERSION N/A 05/15/2013   Procedure: TRANSESOPHAGEAL ECHOCARDIOGRAM (TEE);  Surgeon: Laverda Page, MD;  Location: [place];  Service: Cardiovascular;  Laterality: N/A;  . thryoid biopsy    . TONSILLECTOMY AND ADENOIDECTOMY  04/01/2014  . TONSILLECTOMY AND ADENOIDECTOMY Bilateral 04/01/2014   Procedure: BILATERAL TONSILLECTOMY AND ADENOIDECTOMY;  Surgeon: Ascencion Dike, MD;  Location: [place];  Service: ENT;  Laterality: Bilateral;  . TOTAL ABDOMINAL HYSTERECTOMY  2001  . TOTAL KNEE ARTHROPLASTY Right 11/03/2015   . TOTAL KNEE ARTHROPLASTY Right 11/03/2015   Procedure: RIGHT TOTAL KNEE ARTHROPLASTY/RIGHT;  Surgeon: Elsie Saas, MD;  Location: [place];  Service: Orthopedics;  Laterality: Right;  . TOTAL KNEE ARTHROPLASTY Left 02/07/2016   Procedure: TOTAL KNEE ARTHROPLASTY;  Surgeon: Elsie Saas, MD;  Location: [place];  Service: Orthopedics;  Laterality: Left;  . TUBAL LIGATION  1990    There were no vitals filed for this visit.      Subjective Assessment - 05/15/16 1606    Subjective Pt states things are going good. She was able to keep up with everyone this past week while on vacation. She feels that her knee is doing great and was able to do some of her exercises.    Patient Stated Goals Pt's main goal is to improve her strength, improve her general mobility, and return to leisure activities including hiking and swimming   Currently in Pain? No/denies                         [facility] Adult PT Treatment/Exercise - 05/15/16 0001      Knee/Hip Exercises: Standing   Heel Raises 1 set;20 reps   Heel Raises Limitations toe raises   Lateral Step Up 2 sets;Step Height: 6";Hand Hold: 2;15 reps   Other Standing Knee Exercises hip hike 2x10 each             Balance Exercises - 05/15/16 1622      Balance Exercises: Standing   Tandem Stance Eyes open;2 reps;20 secs;Foam/compliant surface  15-20 sec each   SLS 2 reps;20 secs;Eyes open;Eyes closed  Lt: 18 sec, Rt: 10 sec (EO); 4-5 sec with EC   Rockerboard Anterior/posterior;Lateral;EO;Intermittent UE support  x2 min each    Tandem Gait Forward;2 reps  supervision           PT Education - 05/15/16 1731    Education provided Yes   Education Details technique with therex; POC   Person(s) Educated Patient   Methods Explanation   Comprehension Verbalized understanding          PT Short Term Goals - 04/24/16 1536      PT SHORT TERM GOAL #1   Title After 2 weeks patient will be independent with TKA protocol HEP in  order to further progress with ROM and strength at home.   Status Achieved     PT SHORT TERM GOAL #2   Title After 4 weeks, patient will report L Knee pain < 4/10 on a VAS 75% of time in order to improve tolerance with ther ex and  ambulation.    Baseline 7/10 when she rolls over in the bed or riding in the car for long period of time, all other 3-4 or less; 5/10 max, but not enough for her to take pain meds   Status Partially Met     PT SHORT TERM GOAL #3   Title After 4 weeks patient will demo improved L knee AAROM to 5-110 degrees in order to improve L knee mobility during funcitonal activities.    Status Achieved           PT Long Term Goals - 04/24/16 1538      PT LONG TERM GOAL #1   Title After 8 weeks, patient will report reduced L knee pain to <3/10 on a VAS in order to improve tolerance with ther ex and ambulation.    Status Not Met     PT LONG TERM GOAL #2   Title After 8 weeks, patient will demo improved L knee AAROM to 0-120 degrees in order to improve stair climbing and leisure activities.    Baseline 3-115 deg   Status Partially Met     PT LONG TERM GOAL #3   Title After 8 weeks, patient will demo improved L quad and HS strength to >4/5 MMT in order to improve performance with outdoor ambulation and transfers.    Baseline flexion 4/5, extension 5/5   Status Achieved     PT LONG TERM GOAL #4   Title After 8 weks, pt will score <11s on TUG Test to demonstrate improved funcitonal strength and activity tolerance to improve safety at home.    Baseline 03/27/16: 9.46 sec without AD   Status Achieved     PT LONG TERM GOAL #5   Title Pt will demo improved strength evident by their ability to ascend/descend 6" steps with reciprocal pattern and no handrails with no more than supervision, x3 trials.   Baseline 1 handrail and reciprocal pattern (+) knee valgus and poor eccentric control   Time 2   Period Weeks   Status New               Plan - 05/15/16 1732     Clinical Impression Statement Pt continues to make progress towards her goals of strength and balance. She demonstrates improved single leg stance, Lt greater than Rt. She also reports improved activity tolerance without noted difficulty. She was able to perform all exercises without increased fatigue or pain. Will continue with high level balance acitvitiy and anticipate d/c in the next few sessions assuming no new issues arise.    Rehab Potential Good   PT Frequency 2x / week   PT Duration 4 weeks   PT Treatment/Interventions ADLs/Self Care Home Management;Cryotherapy;Electrical Stimulation;Moist Heat;Balance training;Therapeutic exercise;Therapeutic activities;Functional mobility training;Stair training;Gait training;DME Instruction;Neuromuscular re-education;Patient/family education;Manual techniques;Taping;Passive range of motion;Scar mobilization   PT Next Visit Plan progress closed chain strengthening; continue to progress tandem stance balance activity static progressing to dynamic.    PT Home Exercise Plan updated with step ups, single leg heel raises, wall squats, tandem/SLS   Consulted and Agree with Plan of Care Patient      Patient will benefit from skilled therapeutic intervention in order to improve the following deficits and impairments:  Abnormal gait, Decreased range of motion, Difficulty walking, Pain, Decreased balance, Hypomobility, Impaired flexibility, Increased edema, Decreased strength, Decreased mobility, Decreased skin integrity, Decreased activity tolerance, Decreased scar mobility  Visit Diagnosis: Stiffness of left knee, not elsewhere classified  Difficulty in walking, not elsewhere  classified  Pain in left knee  Muscle weakness     Problem List Patient Active Problem List   Diagnosis Date Noted  . Postoperative anemia due to acute blood loss 02/08/2016  . Primary localized osteoarthritis of left knee   . Primary localized osteoarthritis of right knee  11/03/2015  . DJD (degenerative joint disease) of knee 11/03/2015  . Rheumatoid arthritis ([place])   . PONV (postoperative nausea and vomiting)   . Seizures ([place])   . Fibromyalgia   . Sleep apnea   . Ventricular septal defect   . History of staph infection   . Refractory migraine 08/10/2014  . Complex partial epilepsy ([place]) 08/10/2014  . S/P tonsillectomy and adenoidectomy 04/01/2014  . Panic anxiety syndrome 06/03/2013  . Positive depression screening 06/03/2013  . Headache(784.0) 06/03/2013  . Unspecified cerebral artery occlusion with cerebral infarction 06/03/2013  . Headache 05/29/2013  . Patent foramen ovale 05/29/2013  . Chest pain 05/29/2013  . Other convulsions 05/29/2013  . Dysphagia, unspecified(787.20) 04/17/2013  . Acute bronchitis 03/27/2013  . Persistent headaches 03/26/2013  . CVA (cerebral infarction) history 03/26/2013  . Hypokalemia 03/26/2013  . Leukocytosis 03/26/2013  . Arthritis 03/26/2013  . QT prolongation 03/26/2013  . Stroke ([place]) 03/26/2013  . Recurrent ventral incisional hernia 04/19/2012  . Obstructive sleep apnea 08/22/2010  . RESTLESS LEGS SYNDROME 08/22/2010  . Moderate intermittent asthma without complication 86/75/4492  . ECZEMA 08/22/2010  . Irritable bowel syndrome 06/23/2010  . DIARRHEA 06/23/2010  . NAUSEA WITH VOMITING 11/01/2009  . ABDOMINAL PAIN, LOWER 11/01/2009  . KNEE, ARTHRITIS, DEGEN./OSTEO 11/18/2008  . DERANGEMENT MENISCUS 11/02/2008  . JOINT EFFUSION, KNEE 11/02/2008  . KNEE PAIN 11/02/2008    6:13 PM,05/15/16 Elly Modena PT, DPT [facility] [place] [address] [place], [place], [address] Phone: [phone]   Fax:  [phone]  Name: IDANIA DESOUZA MRN: [id number] Date of Birth: 01/22/1961

## 2016-05-18 ENCOUNTER — Ambulatory Visit (HOSPITAL_COMMUNITY): Payer: Medicare Other | Attending: Family Medicine | Admitting: Physical Therapy

## 2016-05-18 DIAGNOSIS — R262 Difficulty in walking, not elsewhere classified: Secondary | ICD-10-CM | POA: Insufficient documentation

## 2016-05-18 DIAGNOSIS — M25562 Pain in left knee: Secondary | ICD-10-CM | POA: Insufficient documentation

## 2016-05-18 DIAGNOSIS — M25662 Stiffness of left knee, not elsewhere classified: Secondary | ICD-10-CM | POA: Insufficient documentation

## 2016-05-18 DIAGNOSIS — M6281 Muscle weakness (generalized): Secondary | ICD-10-CM | POA: Diagnosis present

## 2016-05-18 NOTE — Therapy (Signed)
[place], [place], [address] Phone: [phone]   Fax:  [phone]  Physical Therapy Treatment (Re-assessment)  Patient Details  Name: [name] MRN: [id number] Date of Birth: [birth year] Referring Provider: Elsie Saas, MD  Encounter Date: 05/18/2016      PT End of Session - 05/18/16 1730    Visit Number 21   Number of Visits 22   Date for PT Re-Evaluation 05/18/16   Authorization Type Medicare/ BCBS   Authorization Time Period 02/24/2016-04/25/2016 NEW: 04/26/16 to 05/26/16   Authorization - Visit Number 21   Authorization - Number of Visits 25   PT Start Time 9233  patient had to wait to check in and I couldn't access chart to just bring her back    PT Stop Time 1642   PT Time Calculation (min) 32 min   Activity Tolerance Patient tolerated treatment well   Behavior During Therapy [facility] for tasks assessed/performed      Past Medical History:  Diagnosis Date  . Allergic rhinitis    uses Flonase daily as needed and takes CLaritin daily  . Anxiety    takes Xanax daily as needed  . Asthma    Albuterol inhaler prn;SYmbicort daily  . Cerebral vascular malformation    sees dr Arnoldo Morale for monitoring as needed, sees dr lewitt for headaches every 4 months  . COPD (chronic obstructive pulmonary disease) ([place])   . Depression    takes Citalopram daily  . Diverticulitis at age 63  . Eczema   . Fibromyalgia   . GERD (gastroesophageal reflux disease)    takes Protonix daily  . Hard of hearing   . History of bronchitis as a child   . History of kidney stones   . History of migraine    last one 10+yrs ago  . History of staph infection 56yr ago  . Hyperlipidemia    takes Pravastatin daily  . IBS (irritable bowel syndrome)    mixed  . Insomnia   . Joint pain   . Joint swelling   . Lactose intolerance   . Nausea    takes Zofran daily as needed  . PFO (patent foramen ovale)   . Plaque psoriasis   .  Pneumonia [place]   hx of  . PONV (postoperative nausea and vomiting)   . Postoperative anemia due to acute blood loss 02/08/2016  . Primary localized osteoarthritis of left knee   . Primary localized osteoarthritis of right knee 11/03/2015  . Rheumatoid arthritis(714.0) 2010   oa and ra;Rhemicade IV every 6wks and Metotrexate weekly  . Seizures ([id number] 03/21/14   takes Depakote daily  . Shortness of breath    with exertion  . Sleep apnea    study done >[id number];uses CPAP nightly  . Spinal headache    patient states that she thinks she had a spinal headache a long time ago  . Stress incontinence   . Stroke (HCC)[place]11/2014   left sided weakness  . Thyroid cyst   . Ventricular septal defect    2014 TEE lists large right to left shunt ASD/PFO (NO VSD mentioned)    Past Surgical History:  Procedure Laterality Date  . APPENDECTOMY  1981  . CARDIAC CATHETERIZATION  2004  . CHOLECYSTECTOMY  1981  . COLONOSCOPY    . ESOPHAGOGASTRODUODENOSCOPY    . FOOT SURGERY  1999   right ankle  . HERNIA REPAIR  2006   x2  .  INCISIONAL HERNIA REPAIR  05/20/2012   Procedure: HERNIA REPAIR INCISIONAL;  Surgeon: Joyice Faster. Cornett, MD;  Location: [facility];  Service: General;  Laterality: N/A;  . INCONTINENCE SURGERY  2010   sling done   . [place]  2001  . KNEE ARTHROSCOPY  1992   left  . left foot plating and scarping for arthritis  2011  . right ear tube insertion  2011  . TEE WITHOUT CARDIOVERSION N/A 05/15/2013   Procedure: TRANSESOPHAGEAL ECHOCARDIOGRAM (TEE);  Surgeon: Laverda Page, MD;  Location: [facility];  Service: Cardiovascular;  Laterality: N/A;  . thryoid biopsy    . TONSILLECTOMY AND ADENOIDECTOMY  04/01/2014  . TONSILLECTOMY AND ADENOIDECTOMY Bilateral 04/01/2014   Procedure: BILATERAL TONSILLECTOMY AND ADENOIDECTOMY;  Surgeon: Ascencion Dike, MD;  Location: [facility];  Service: ENT;  Laterality: Bilateral;  . TOTAL ABDOMINAL HYSTERECTOMY  2001  . TOTAL KNEE  ARTHROPLASTY Right 11/03/2015  . TOTAL KNEE ARTHROPLASTY Right 11/03/2015   Procedure: RIGHT TOTAL KNEE ARTHROPLASTY/RIGHT;  Surgeon: Elsie Saas, MD;  Location: [facility];  Service: Orthopedics;  Laterality: Right;  . TOTAL KNEE ARTHROPLASTY Left 02/07/2016   Procedure: TOTAL KNEE ARTHROPLASTY;  Surgeon: Elsie Saas, MD;  Location: [facility];  Service: Orthopedics;  Laterality: Left;  . TUBAL LIGATION  1990    There were no vitals filed for this visit.      Subjective Assessment - 05/18/16 1610    Subjective Patient arrives stating that she is really doing quite well overall; she is having more problems with her L knee than her R. She has more problems at night when she is trying to sleep right now. Steps are still hard, going down is the hardest and she does not have any trouble going down steps at all. She rates herself as being 85+%/100 on a subjective scale with the last 10-15% not necessarily being related to her knees.    Pertinent History CVA, DJD, RA, chest pain, and seizures    How long can you sit comfortably? 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 8/3- maybe 30 minutes max    How long can you walk comfortably? 8/3- 30 minutes    Diagnostic tests N/A   Patient Stated Goals Pt's main goal is to improve her strength, improve her general mobility, and return to leisure activities including hiking and swimming   Currently in Pain? Yes   Pain Score 4    Pain Location Knee   Pain Orientation Left   Pain Descriptors / Indicators Aching;Spasm;Nagging   Pain Type Surgical pain   Pain Radiating Towards none    Pain Onset More than a month ago   Pain Frequency Intermittent   Aggravating Factors  movement    Pain Relieving Factors movement, more ROM exercise    Effect of Pain on Daily Activities limited activity particpation            [facility] PT Assessment - 05/18/16 0001      Observation/Other Assessments   Focus on Therapeutic  Outcomes (FOTO)  53% limited, was 74% limited      AROM   Right Knee Extension 1   Right Knee Flexion 126   Left Knee Extension 5   Left Knee Flexion 119     Strength   Right Hip Flexion 3/5   Right Hip Extension 3/5   Right Hip ABduction 4/5   Left Hip Flexion 3/5  Left Hip Extension 3/5   Left Hip ABduction 4-/5   Right Knee Flexion 4/5   Right Knee Extension 4/5   Left Knee Flexion 4/5   Left Knee Extension 4/5   Right Ankle Dorsiflexion 5/5   Left Ankle Dorsiflexion 5/5     6 minute walk test results    Aerobic Endurance Distance Walked 526   Endurance additional comments 3MWT, cane      Timed Up and Go Test   Normal TUG (seconds) 10.5     High Level Balance   High Level Balance Comments SLS R 7 seconds, L 19 seconds                              PT Education - 05/18/16 1729    Education provided Yes   Education Details one last visit to focus on advanced HEP before DC    Person(s) Educated Patient   Methods Explanation   Comprehension Verbalized understanding          PT Short Term Goals - 05/18/16 1734      PT SHORT TERM GOAL #1   Title After 2 weeks patient will be independent with TKA protocol HEP in order to further progress with ROM and strength at home.   Time 2   Period Weeks   Status Achieved     PT SHORT TERM GOAL #2   Title After 4 weeks, patient will report L Knee pain < 4/10 on a VAS 75% of time in order to improve tolerance with ther ex and ambulation.    Time 3   Period Weeks   Status Partially Met     PT SHORT TERM GOAL #3   Title After 4 weeks patient will demo improved L knee AAROM to 5-110 degrees in order to improve L knee mobility during funcitonal activities.    Time 3   Period Weeks   Status Achieved     PT SHORT TERM GOAL #4   Title Patient will independently verbalize pain management strategies including use of cryotherapy in order to manage pain levels with ADLs and functional mobility activities.     Time 2   Period Weeks   Status Achieved           PT Long Term Goals - 05/18/16 1639      PT LONG TERM GOAL #1   Title After 8 weeks, patient will report reduced L knee pain to <3/10 on a VAS in order to improve tolerance with ther ex and ambulation.    Baseline 8/3- 4-5/10 on average   Time 7   Period Weeks   Status Not Met     PT LONG TERM GOAL #2   Title After 8 weeks, patient will demo improved L knee AAROM to 0-120 degrees in order to improve stair climbing and leisure activities.    Baseline 8/3- 114    Time 7   Period Weeks   Status Not Met     PT LONG TERM GOAL #3   Title After 8 weeks, patient will demo improved L quad and HS strength to >4/5 MMT in order to improve performance with outdoor ambulation and transfers.    Time 7   Period Weeks   Status Not Met     PT LONG TERM GOAL #4   Title After 8 weks, pt will score <11s on TUG Test to demonstrate improved funcitonal strength and activity tolerance to  improve safety at home.    Status Achieved     PT LONG TERM GOAL #5   Title Pt will demo improved strength evident by their ability to ascend/descend 6" steps with reciprocal pattern and no handrails with no more than supervision, x3 trials.               Plan - 2016-06-10 1731    Clinical Impression Statement Re-assessment performed today. Patient reports she is doing quite well, is able to do all she needs and wants to do and really most of her remaining impairments are due to things other than her knees. Upon examination, patient continues to demonstrate some functional weakness, unsteadiness, mild knee stiffness, reduced functional activity tolerance, and difficulty with eccentric control on stairs. At this time recommend one last skilled session to focus solely on development of finalized advanced HEP before DC to home program; recommend possible referrals to walking club/local [employer] as well.    Rehab Potential Good   PT Frequency Other (comment)  one  last session    PT Duration Other (comment)  one last session    PT Treatment/Interventions ADLs/Self Care Home Management;Cryotherapy;Electrical Stimulation;Moist Heat;Balance training;Therapeutic exercise;Therapeutic activities;Functional mobility training;Stair training;Gait training;DME Instruction;Neuromuscular re-education;Patient/family education;Manual techniques;Taping;Passive range of motion;Scar mobilization   PT Next Visit Plan assign advanced HEP, DC    Consulted and Agree with Plan of Care Patient      Patient will benefit from skilled therapeutic intervention in order to improve the following deficits and impairments:  Abnormal gait, Decreased range of motion, Difficulty walking, Pain, Decreased balance, Hypomobility, Impaired flexibility, Increased edema, Decreased strength, Decreased mobility, Decreased skin integrity, Decreased activity tolerance, Decreased scar mobility  Visit Diagnosis: Stiffness of left knee, not elsewhere classified  Difficulty in walking, not elsewhere classified  Pain in left knee       G-Codes - 06/10/16 1735    Functional Assessment Tool Used Clinical judgement based on ROM, functional strength and mobility   Functional Limitation Mobility: Walking and moving around   Mobility: Walking and Moving Around Current Status [phone]) At least 20 percent but less than 40 percent impaired, limited or restricted   Mobility: Walking and Moving Around Goal Status [phone]) At least 20 percent but less than 40 percent impaired, limited or restricted      Problem List Patient Active Problem List   Diagnosis Date Noted  . Postoperative anemia due to acute blood loss 02/08/2016  . Primary localized osteoarthritis of left knee   . Primary localized osteoarthritis of right knee 11/03/2015  . DJD (degenerative joint disease) of knee 11/03/2015  . Rheumatoid arthritis ([place])   . PONV (postoperative nausea and vomiting)   . Seizures ([place])   . Fibromyalgia   .  Sleep apnea   . Ventricular septal defect   . History of staph infection   . Refractory migraine 08/10/2014  . Complex partial epilepsy ([place]) 08/10/2014  . S/P tonsillectomy and adenoidectomy 04/01/2014  . Panic anxiety syndrome 06/03/2013  . Positive depression screening 06/03/2013  . Headache(784.0) 06/03/2013  . Unspecified cerebral artery occlusion with cerebral infarction 06/03/2013  . Headache 05/29/2013  . Patent foramen ovale 05/29/2013  . Chest pain 05/29/2013  . Other convulsions 05/29/2013  . Dysphagia, unspecified(787.20) 04/17/2013  . Acute bronchitis 03/27/2013  . Persistent headaches 03/26/2013  . CVA (cerebral infarction) history 03/26/2013  . Hypokalemia 03/26/2013  . Leukocytosis 03/26/2013  . Arthritis 03/26/2013  . QT prolongation 03/26/2013  . Stroke ([place]) 03/26/2013  . Recurrent ventral incisional  hernia 04/19/2012  . Obstructive sleep apnea 08/22/2010  . RESTLESS LEGS SYNDROME 08/22/2010  . Moderate intermittent asthma without complication 03/97/9536  . ECZEMA 08/22/2010  . Irritable bowel syndrome 06/23/2010  . DIARRHEA 06/23/2010  . NAUSEA WITH VOMITING 11/01/2009  . ABDOMINAL PAIN, LOWER 11/01/2009  . KNEE, ARTHRITIS, DEGEN./OSTEO 11/18/2008  . DERANGEMENT MENISCUS 11/02/2008  . JOINT EFFUSION, KNEE 11/02/2008  . KNEE PAIN 11/02/2008    Deniece Ree PT, DPT [facility] [address] [place], [place], [address] Phone: [phone]   Fax:  [phone]  Name: ALLEYA DEMETER MRN: [id number] Date of Birth: 12-18-1960

## 2016-05-19 ENCOUNTER — Encounter: Payer: Self-pay | Admitting: Neurology

## 2016-05-19 ENCOUNTER — Ambulatory Visit (INDEPENDENT_AMBULATORY_CARE_PROVIDER_SITE_OTHER): Payer: Medicare Other | Admitting: Neurology

## 2016-05-19 VITALS — BP 120/71 | HR 46 | Ht 64.5 in | Wt 147.4 lb

## 2016-05-19 DIAGNOSIS — G40219 Localization-related (focal) (partial) symptomatic epilepsy and epileptic syndromes with complex partial seizures, intractable, without status epilepticus: Secondary | ICD-10-CM

## 2016-05-19 MED ORDER — DIVALPROEX SODIUM ER 500 MG PO TB24: ORAL_TABLET | ORAL | 3 refills | Status: DC

## 2016-05-19 NOTE — Progress Notes (Signed)
[facility]  PATIENT: [name] DOB: Aug 20, 1961   HISTORY FROM: patient, chart REASON FOR VISIT: routine follow up  HISTORY OF PRESENT ILLNESS:  05/02/13 (PS): 55 year old Caucasian lady being seen today for the first office followup visit for hospital admission for stroke. She presented with sudden onset of unstable gait and dizziness on 03/25/13. CT scan of the head showed equal local right frontal low-density and subsequently MRI scan of the brain confirmed right parietal acute ischemic infarct. MRA of the brain showed diffuse mild intracranial atherosclerotic changes without any large vessel occlusion. Lipid profile and hemoglobin A1c were normal. MRA of the brain showed a tiny incidental 1.2 mm right cavernous internal carotid artery aneurysm. Transthoracic echo showed normal ejection fraction. Carotid ultrasounds were unremarkable. She was not found to have significant vascular risk factors. Except for sleep apnea for which she does use CPAP mask every night. Extensive lab work done in the hospital included basic metabolic panel, ANA, ESR, complement levels, RPR, hypercoagulable panel all of which were negative. She states that she's been having headaches which are almost daily since discharge. She has been taking tramadol which helps but only for short while. She also complains of some intermittent dizziness and some left-sided incoordination which is improving but is not back to normal.  Returned to hospital on 05/29/13, after being found by her husband on the ground rocking back and forth screaming loudly clutching her chest. At the time pt would not answer her husband, just screaming in pain. Pt does not recall what happened. She thinks she lost 3 hours of time where she cannot recollect what happened. Was found in work up to have a PFO and has been enrolled in a study and followed by Dr.Jaianna Nicoll as an outpatient. Patient reports that at baseline she has some difficulties  with reasoning and ambulates with a cane. Reports daily headaches since that time as well.   UPDATE 06/03/13 (LL):  Patient comes in for stroke follow up since last visit on 05/02/13.  She is wearing the cardiac event monitor, will be finished on Friday, sees Dr Einar Gip then.  Headaches are some better, taking 120m Topamax daily at bedtime.  Headache is still every day, taking Tramadol 2-4 pills every day, 2 at a time.  Increased problems with adding, calculations, completing tasks, cooking.  Short term memory problems.  Reports trouble breathing at times and feelings of lightheadedness.  Paresthesias in bilateral hands and feel when standing for long periods.  No prior history of breathing difficulty except for mild asthma.  Tearful in office.  Taking daily aspirin for secondary stroke prevention.  Patient denies medication side effects, with no signs of bleeding or excessive bruising.   Update 08/10/2014 : She returns for clinical follow up visit in the office after last visit more than a year ago. She has noted significant worsening of her migraine headaches in the last several months and now she's been having almost daily headaches. Headaches are severe and disabling constant. They're very in severity from 5-10/10. They're sharp sudden and throbbing in nature. Headaches have increased her physical activity and she has photophobia, nausea and occasional vomiting. The headache is reduced by sleep. She takes 2-4 tablets of tramadol daily with only partial relief. She also takes 2 tablets of hydrocodone 5/325 for her chronic shoulder pain from rotator cuff injury. She is currently on Depakote ER 500 twice daily and Topamax 50 mg twice daily. The Depakote was working quite well initially but in the  last   several months has not worked as well. She also continues to have episodes of possible complex partial seizures. The husband has witnessed several of these states that she start staining is briefly unresponsive  has minor jerking of her extremities and subsequently wets her pants. She had a great response to Depakote when it was initially started but for the last couple of months these have increased in frequency to now almost once a week. She has applied for and gotten Social Security disability but wants me to do some paperwork for her [place] state long-term disability. She continues to followup in the [facility] PFO stroke trial and was last seen in July 2015 Update 05/19/2016 : Patient is seen today for follow-up accompanied by husband. She has now completed parts patient in the [facility] PFO closure trial. She's done well and not had any recurrent stroke or TIA symptoms for 3 years. She has been however complaining of episodes of possible complex partial seizures. She describes this as brief episodes when she is staring off into space and does not respond to questions. These last from 5-10 minutes occasionally she can wet her pants as well as grab objects from her hands. There are no witnessed which is controlling movements on her hands. This occurs at a frequency of once every few weeks. These seem to be triggered by stress. She is currently taking Depakote 500 twice daily. She is also on Topamax 50 mg twice daily for headaches. She states headaches are not severe but she does get posterior headaches as well as to moderate in intensity. She has not been doing well last stress relaxation activities on neck exercises. REVIEW OF SYSTEMS: Full 14 system review of systems performed and notable only for:  Appetite change, fatigue, hearing loss, ringing in the ears, runny nose, shortness of breath, light sensitivity, heat and cold intolerance, constipation, nausea, apnea, snoring, sleep talking, acting out dreams, allergies, frequency of urination, bladder urgency, joint pain, walking difficulty, and the problems and confusion depression, facial drooping, weakness, tremors, seizures, dizziness, headache, memory loss    ALLERGIES: Allergies  Allergen Reactions  . Aquacel [Carboxymethylcellulose] Other (See Comments)    Blisters   . Nickel   . Sulfonamide Derivatives Anaphylaxis, Hives and Swelling    REACTION: swelling, tongue and hives  . Aspirin Nausea And Vomiting    REACTION: severe gi upset.  Pt states she can tolerate 81 mg asa only.   . Ciprofloxacin Itching and Swelling    REACTION: angioedema, urticaria  . Cosyntropin Hives and Swelling    REACTION: swelling, hives  . Adhesive [Tape] Other (See Comments)    Blisters skin  . Amoxicillin Nausea And Vomiting    REACTION: GI upset  . Guaifenesin Rash    REACTION: head rash  . Moxifloxacin Other (See Comments)    gi upset  . Nsaids Other (See Comments)    Gi upset    HOME MEDICATIONS: Outpatient Medications Prior to Visit  Medication Sig Dispense Refill  . acetaminophen (TYLENOL) 325 MG tablet Take 2 tablets (650 mg total) by mouth every 4 (four) hours.    Marland Kitchen albuterol (PROVENTIL HFA;VENTOLIN HFA) 108 (90 BASE) MCG/ACT inhaler Inhale 2 puffs into the lungs every 6 (six) hours as needed for wheezing or shortness of breath.    . ALPRAZolam (XANAX) 0.5 MG tablet Take 0.5 mg by mouth 2 (two) times daily as needed for anxiety.     . citalopram (CELEXA) 20 MG tablet TAKE TWO TABLETS  BY MOUTH DAILY 60 tablet 0  . clobetasol ointment (TEMOVATE) 2.23 % Apply 1 application topically 2 (two) times daily as needed (inflammation on feet.).     Marland Kitchen cycloSPORINE (RESTASIS) 0.05 % ophthalmic emulsion Place 4 drops into both eyes 2 (two) times daily.    Marland Kitchen docusate sodium (COLACE) 100 MG capsule 1 tab 2 times a day while on narcotics.  STOOL SOFTENER (Patient taking differently: daily as needed. 1 tab 2 times a day while on narcotics.  STOOL SOFTENER) 60 capsule 0  . fluticasone (FLONASE) 50 MCG/ACT nasal spray Place 2 sprays into both nostrils daily as needed for allergies.     . folic acid (FOLVITE) 1 MG tablet Take 1 mg by mouth daily.    Marland Kitchen loratadine  (CLARITIN) 10 MG tablet Take 10 mg by mouth daily as needed.     . Multiple Vitamin (MULTIVITAMIN) capsule Take 1 capsule by mouth daily.     . pantoprazole (PROTONIX) 40 MG tablet Take 40 mg by mouth daily.    . polyethylene glycol (MIRALAX / GLYCOLAX) packet 17grams in 16 oz of water twice a day until bowel movement.  LAXITIVE.  Restart if two days since last bowel movement 14 each 0  . pravastatin (PRAVACHOL) 20 MG tablet Take 20 mg by mouth daily.    . Probiotic Product (PROBIOTIC DAILY PO) Take 1 capsule by mouth daily.    Marland Kitchen topiramate (TOPAMAX) 50 MG tablet TAKE ONE TABLET BY MOUTH TWICE DAILY 180 tablet 1  . divalproex (DEPAKOTE ER) 500 MG 24 hr tablet TAKE TWO TABLETS BY MOUTH TWICE DAILY 120 tablet 0  . cephALEXin (KEFLEX) 500 MG capsule Take 1 capsule (500 mg total) by mouth 4 (four) times daily. 40 capsule 0  . enoxaparin (LOVENOX) 30 MG/0.3ML injection Inject 0.74m every 12 hrs to prevent blood clots    BLOOD THINNER 30 Syringe 0  . NON FORMULARY Place 1 spray into the nose 2 (two) times daily as needed. Compounded Lidocaine Spray.  Use as directed for headaches. ([facility], [facility])    . oxyCODONE (OXY IR/ROXICODONE) 5 MG immediate release tablet 1 tablets every 4-6 hrs as needed for pain 60 tablet 0   No facility-administered medications prior to visit.     PAST MEDICAL HISTORY: Past Medical History:  Diagnosis Date  . Allergic rhinitis    uses Flonase daily as needed and takes CLaritin daily  . Anxiety    takes Xanax daily as needed  . Asthma    Albuterol inhaler prn;SYmbicort daily  . Cerebral vascular malformation    sees dr jArnoldo Moralefor monitoring as needed, sees dr lewitt for headaches every 4 months  . [place] disease[place]   . Depression    takes Citalopram daily  . Diverticulitis at age 55 . Eczema   . Fibromyalgia   . GERD (gastroesophageal reflux disease)    takes Protonix daily  . Hard of hearing   . History of bronchitis as  a child   . History of kidney stones   . History of migraine    last one 10+yrs ago  . History of staph infection 524yrago  . Hyperlipidemia    takes Pravastatin daily  . IBS (irritable bowel syndrome)    mixed  . Insomnia   . Joint pain   . Joint swelling   . Lactose intolerance   . Nausea    takes Zofran daily as needed  . PFO (patent foramen ovale)   .  Plaque psoriasis   . Pneumonia 44yr ago   hx of  . PONV (postoperative nausea and vomiting)   . Postoperative anemia due to acute blood loss 02/08/2016  . Primary localized osteoarthritis of left knee   . Primary localized osteoarthritis of right knee 11/03/2015  . Rheumatoid arthritis(714.0) 2010   oa and ra;Rhemicade IV every 6wks and Metotrexate weekly  . Seizures ([place] 03/21/14   takes Depakote daily  . Shortness of breath    with exertion  . Sleep apnea    study done >[id number];uses CPAP nightly  . Spinal headache    patient states that she thinks she had a spinal headache a long time ago  . Stress incontinence   . [place]   left sided weakness  . Thyroid cyst   . Ventricular septal defect    2014 TEE lists large right to left shunt ASD/PFO (NO VSD mentioned)    PAST SURGICAL HISTORY: Past Surgical History:  Procedure Laterality Date  . APPENDECTOMY  1981  . CARDIAC CATHETERIZATION  2004  . CHOLECYSTECTOMY  1981  . COLONOSCOPY    . ESOPHAGOGASTRODUODENOSCOPY    . FOOT SURGERY  1999   right ankle  . HERNIA REPAIR  2006   x2  . INCISIONAL HERNIA REPAIR  05/20/2012   Procedure: HERNIA REPAIR INCISIONAL;  Surgeon: ThoJoyice Fasterornett, MD;  Location: [facility];  Service: General;  Laterality: N/A;  . INCONTINENCE SURGERY  2010   sling done   . [id number]  . KNEE ARTHROSCOPY  1992   left  . left foot plating and scarping for arthritis  2011  . right ear tube insertion  2011  . TEE WITHOUT CARDIOVERSION N/A 05/15/2013   Procedure: TRANSESOPHAGEAL ECHOCARDIOGRAM (TEE);   Surgeon: JagLaverda PageD;  [facility];  Laterality: N/A;  . thryoid biopsy    . TONSILLECTOMY AND ADENOIDECTOMY  04/01/2014  . TONSILLECTOMY AND ADENOIDECTOMY Bilateral 04/01/2014   Procedure: BILATERAL TONSILLECTOMY AND ADENOIDECTOMY;  Surgeon: SuiAscencion DikeD;  [facility];  Laterality: Bilateral;  . TOTAL ABDOMINAL HYSTERECTOMY  2001  . TOTAL KNEE ARTHROPLASTY Right 11/03/2015  . TOTAL KNEE ARTHROPLASTY Right 11/03/2015   Procedure: RIGHT TOTAL KNEE ARTHROPLASTY/RIGHT;  Surgeon: RobElsie SaasD;  [facility] [place] [place];  Laterality: Right;  . TOTAL KNEE ARTHROPLASTY Left 02/07/2016   Procedure: TOTAL KNEE ARTHROPLASTY;  Surgeon: RobElsie SaasD;  [facility];  Laterality: Left;  . TUBAL LIGATION  1990    FAMILY HISTORY: Family History  Problem Relation Age of Onset  . Ovarian cancer Mother   . Diabetes Maternal Grandmother   . Arthritis Maternal Grandmother   . Diabetes Brother   . Diabetes Paternal Grandmother   . Diabetes Maternal Grandfather   . Diabetes Paternal Grandfather   . Heart disease Brother   . Heart disease Other     Uncle  . Breast cancer Maternal Aunt     SOCIAL HISTORY: Social History   Social History  . Marital status: Married    Spouse name: [name] Number of children: 3  . Years of education: College   Occupational History  . 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 Smoking status: Former Smoker    Packs/day: 2.00    Years: 30.00  Types: Cigarettes    Quit date: 10/20/1999  . Smokeless tobacco: Never Used     Comment: quit smoking in 2002  . Alcohol use No  . Drug use: No  . Sexual activity: Yes   Other Topics Concern  . Not on file   Social History Narrative   Patient is married with 3 children.   Patient is right handed.   Patient has college education.   Caffeine Use: 1.5  cup of coffee in a.m     PHYSICAL EXAM  Vitals:   05/19/16 0823  BP: 120/71  BP Location: Right Arm  Patient Position: Sitting  Cuff Size: Normal  Pulse: (!) 46  Weight: 147 lb 6.4 oz (66.9 kg)  Height: 5' 4.5" (1.638 m)   Body mass index is 24.91 kg/m.  Physical Exam  General: well developed, well nourished middle aged Caucasian aldy, seated Head: head normocephalic and atraumatic.   Neck:  no carotid or supraclavicular bruits .mild posterior cervical muscles spasm and tenderness Cardiovascular: regular rate and rhythm, no murmurs  Musculoskeletal: no deformity left shoulder elevation limited by pain Skin: no rash/petichiae   Vascular: Normal pulses all extremities  Neurologic Exam  Mental Status: Awake and fully alert. Oriented to place and time. Recent and remote memory intact. Attention span, concentration and fund of knowledge appropriate. Mood and affect are   Cranial Nerves: Fundoscopic exam reveals sharp disc margins. Pupils equal, briskly reactive to light. Extraocular movements full without nystagmus. Visual fields show partial left temporal visual field deficit to confrontation. Hearing intact. Facial sensation intact. Face, tongue, palate moves normally and symmetrically.  Motor: Normal bulk and tone. Normal strength in all tested extremity muscles.Diminished fine finger movements on left and orbits right over left upper extremity. Patient has bilateral give-way weakness but no focality   Sensory: intact to touch and pinprick and vibratory.  Coordination: Rapid alternating movements normal in all extremities. Finger-to-nose and heel-to-shin performed accurately bilaterally.  Gait and Station: Arises from chair without difficulty. Stance is normal. Gait demonstrates slow, normal stride length and balance. Able to heel, toe and tandem walk with mild difficulty.  .  Reflexes: 1+ and symmetric. Toes downgoing.  DIAGNOSTIC DATA (LABS, IMAGING, TESTING) - I reviewed patient  records, labs, notes, testing and imaging myself where available.  Lab Results  Component Value Date   WBC 9.1 02/10/2016   HGB 10.7 (L) 02/10/2016   HCT 32.5 (L) 02/10/2016   MCV 93.1 02/10/2016   PLT 110 (L) 02/10/2016      Component Value Date/Time   NA 142 02/10/2016 0522   K 3.8 02/10/2016 0522   CL 105 02/10/2016 0522   CO2 28 02/10/2016 0522   GLUCOSE 94 02/10/2016 0522   BUN 8 02/10/2016 0522   CREATININE 0.60 02/10/2016 0522   CALCIUM 8.5 (L) 02/10/2016 0522   PROT 6.9 01/27/2016 1057   ALBUMIN 3.6 01/27/2016 1057   AST 15 01/27/2016 1057   ALT 10 (L) 01/27/2016 1057   ALKPHOS 65 01/27/2016 1057   BILITOT 0.3 01/27/2016 1057   GFRNONAA >60 02/10/2016 0522   GFRAA >60 02/10/2016 0522   Lab Results  Component Value Date   CHOL 139 03/27/2013   HDL 23 (L) 03/27/2013   LDLCALC 89 03/27/2013   TRIG 135 03/27/2013   CHOLHDL 6.0 03/27/2013   Lab Results  Component Value Date   HGBA1C 5.6 03/27/2013   TCD 05/01/13 Positive Transcranial Doppler Bubble Study indicative of moderate size right to left intracardiac or intrapulmonary  shunt.  TEE 05/15/13 Large PFO with strongly positive double contrast for bidirectional shunting both at rest and Valsalva. Marland Kitchen PFO measures 109m and tunnel length 15 mm. Otherwise normal TEE. Ct Head Wo Contrast 05/29/2013  No acute intracranial abnormality is identified. Encephalomalacia in the right parietal lobe is compatible with the parietal lobe infarct that occurred in June 2014. Stable right frontal lobe cavernoma.  EEG ADULT 05/30/13  This is a normal awake and drowsy EEG. Please, be aware that a normal EEG does not exclude the possibility of epilepsy. Clinical correlation is advised.   Mr Brain Wo Contrast  05/30/2013  Expected evolutionary change related to chronic right parietal infarct. No new infarction is seen. Unchanged right frontal cavernoma.     ASSESSMENT AND PLAN Ms. CPAMALA HAYMANis a 55y.o. female presenting with dizziness,  weakness and headache in June 2014 from acute non hemorrhagic small to moderate size right parietal lobe infarct felt to be embolic.  Significant vascular risk factors are sleep apnea and large PFO confirmed by bubble study and TEE.   Headaches post stroke likely mixed migraine and tension headaches which habe now become refractory and allmost daily.  Complex partial seizures with recent flare up I had a long d/w patient and her husband about her remote stroke, complex partuial seizures,tension headaches, PFO, risk for recurrent stroke/TIAs, personally independently reviewed imaging studies and stroke evaluation results and answered questions.Continue aspirin 81 mg daily  for secondary stroke prevention and maintain strict control of hypertension with blood pressure goal below 130/90, diabetes with hemoglobin A1c goal below 6.5% and lipids with LDL cholesterol goal below 70 mg/dL. I also advised the patient to eat a healthy diet with plenty of whole grains, cereals, fruits and vegetables, exercise regularly and maintain ideal body weight .I also talked to her about the recent results of the  [facility] PFO closure trial suggesting benefit of endovascular PFO closure over medical treatment. Since patient has remained free from neurovascular symptoms for more than 2 years she seems reluctant to consider PFO closure at the present time and I am in agreement. I recommend she increase the dose of Depakote to 1000 mg in the morning and 1500 mg in the evening to help with a complex partial seizures and continue Topamax 50 mg twice daily for headaches. Followup in the future with me in 6 months or call earlier if necessary.       PAntony Contras MD  05/19/2016, 1:05 PM  [facility] [address] [place] [phone]

## 2016-05-19 NOTE — Patient Instructions (Signed)
I had a long d/w patient and her husbandabout her remote stroke, complex partuial seizures,tension headaches, PFO, risk for recurrent stroke/TIAs, personally independently reviewed imaging studies and stroke evaluation results and answered questions.Continue aspirin 81 mg daily  for secondary stroke prevention and maintain strict control of hypertension with blood pressure goal below 130/90, diabetes with hemoglobin A1c goal below 6.5% and lipids with LDL cholesterol goal below 70 mg/dL. I also advised the patient to eat a healthy diet with plenty of whole grains, cereals, fruits and vegetables, exercise regularly and maintain ideal body weight .I also talked to her about the recent results of the  [facility] [employer] PFO closure trial suggesting benefit of endovascular PFO closure over medical treatment. Since patient has remained free from neurovascular symptoms for more than 2 years she seems reluctant to consider PFO closure at the present time and I am in agreement. I recommend she increase the dose of Depakote to 1000 mg in the morning and 1500 mg in the evening to help with a complex partial seizures and continue Topamax 50 mg twice daily for headaches. Followup in the future with me in 6 months or call earlier if necessary.

## 2016-05-22 ENCOUNTER — Ambulatory Visit (HOSPITAL_COMMUNITY): Payer: Medicare Other | Admitting: Physical Therapy

## 2016-05-22 DIAGNOSIS — M25562 Pain in left knee: Secondary | ICD-10-CM

## 2016-05-22 DIAGNOSIS — M25662 Stiffness of left knee, not elsewhere classified: Secondary | ICD-10-CM

## 2016-05-22 DIAGNOSIS — R262 Difficulty in walking, not elsewhere classified: Secondary | ICD-10-CM

## 2016-05-22 DIAGNOSIS — M6281 Muscle weakness (generalized): Secondary | ICD-10-CM

## 2016-05-22 NOTE — Therapy (Signed)
[place], [place], [address] Phone: [phone]   Fax:  [phone]  Physical Therapy Treatment/Discharge  Patient Details  Name: [name] MRN: [id number] Date of Birth: 03/03/1961 Referring Provider: Elsie Saas, MD  Encounter Date: 05/22/2016      PT End of Session - 05/22/16 1631    Visit Number 22   Number of Visits 22   Date for PT Re-Evaluation 05/18/16   Authorization Type Medicare/ BCBS   Authorization Time Period 02/24/2016-04/25/2016 NEW: 04/26/16 to 05/26/16   Authorization - Visit Number 65   Authorization - Number of Visits 25   PT Start Time 8309   PT Stop Time 1629   PT Time Calculation (min) 39 min   Equipment Utilized During Treatment Gait belt   Activity Tolerance Patient tolerated treatment well   Behavior During Therapy [facility] for tasks assessed/performed      Past Medical History:  Diagnosis Date  . Allergic rhinitis    uses Flonase daily as needed and takes CLaritin daily  . Anxiety    takes Xanax daily as needed  . Asthma    Albuterol inhaler prn;SYmbicort daily  . Cerebral vascular malformation    sees dr Arnoldo Morale for monitoring as needed, sees dr lewitt for headaches every 4 months  . COPD (chronic obstructive pulmonary disease) ([place])   . Depression    takes Citalopram daily  . Diverticulitis at age 58  . Eczema   . Fibromyalgia   . GERD (gastroesophageal reflux disease)    takes Protonix daily  . Hard of hearing   . History of bronchitis as a child   . History of kidney stones   . History of migraine    last one 10+yrs ago  . History of staph infection 47yr ago  . Hyperlipidemia    takes Pravastatin daily  . IBS (irritable bowel syndrome)    mixed  . Insomnia   . Joint pain   . Joint swelling   . Lactose intolerance   . Nausea    takes Zofran daily as needed  . PFO (patent foramen ovale)   . Plaque psoriasis   . Pneumonia 259yrago   hx of  . PONV  (postoperative nausea and vomiting)   . Postoperative anemia due to acute blood loss 02/08/2016  . Primary localized osteoarthritis of left knee   . Primary localized osteoarthritis of right knee 11/03/2015  . Rheumatoid arthritis(714.0) 2010   oa and ra;Rhemicade IV every 6wks and Metotrexate weekly  . [id number] 03/21/14   takes Depakote daily  . Shortness of breath    with exertion  . Sleep apnea    study done >[id number];uses CPAP nightly  . Spinal headache    patient states that she thinks she had a spinal headache a long time ago  . Stress incontinence   . Stroke (HCC)[place]/2014   left sided weakness  . Thyroid cyst   . Ventricular septal defect    2014 TEE lists large right to left shunt ASD/PFO (NO VSD mentioned)    Past Surgical History:  Procedure Laterality Date  . APPENDECTOMY  1981  . CARDIAC CATHETERIZATION  2004  . CHOLECYSTECTOMY  1981  . COLONOSCOPY    . ESOPHAGOGASTRODUODENOSCOPY    . FOOT SURGERY  1999   right ankle  . HERNIA REPAIR  2006   x2  . INCISIONAL HERNIA REPAIR  05/20/2012   Procedure: HERNIA REPAIR INCISIONAL;  Surgeon: Joyice Faster. Cornett, MD;  Location: [facility];  Service: General;  Laterality: N/A;  . INCONTINENCE SURGERY  2010   sling done   . [place]  2001  . KNEE ARTHROSCOPY  1992   left  . left foot plating and scarping for arthritis  2011  . right ear tube insertion  2011  . TEE WITHOUT CARDIOVERSION N/A 05/15/2013   Procedure: TRANSESOPHAGEAL ECHOCARDIOGRAM (TEE);  Surgeon: Laverda Page, MD;  Location: [place];  Service: Cardiovascular;  Laterality: N/A;  . thryoid biopsy    . TONSILLECTOMY AND ADENOIDECTOMY  04/01/2014  . TONSILLECTOMY AND ADENOIDECTOMY Bilateral 04/01/2014   Procedure: BILATERAL TONSILLECTOMY AND ADENOIDECTOMY;  Surgeon: Ascencion Dike, MD;  Location: [place];  Service: ENT;  Laterality: Bilateral;  . TOTAL ABDOMINAL HYSTERECTOMY  2001  . TOTAL KNEE ARTHROPLASTY Right 11/03/2015  . TOTAL KNEE  ARTHROPLASTY Right 11/03/2015   Procedure: RIGHT TOTAL KNEE ARTHROPLASTY/RIGHT;  Surgeon: Elsie Saas, MD;  Location: [place];  Service: Orthopedics;  Laterality: Right;  . TOTAL KNEE ARTHROPLASTY Left 02/07/2016   Procedure: TOTAL KNEE ARTHROPLASTY;  Surgeon: Elsie Saas, MD;  Location: [place];  Service: Orthopedics;  Laterality: Left;  . TUBAL LIGATION  1990    There were no vitals filed for this visit.      Subjective Assessment - 05/22/16 1551    Subjective Pt arrives stating things are going well. She has very minimal pain, but it goes away.    Pertinent History CVA, DJD, RA, chest pain, and seizures    How long can you sit comfortably? 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 8/3- maybe 30 minutes max    How long can you walk comfortably? 8/3- 30 minutes    Diagnostic tests N/A   Patient Stated Goals Pt's main goal is to improve her strength, improve her general mobility, and return to leisure activities including hiking and swimming   Currently in Pain? No/denies   Pain Onset More than a month ago                         [facility] Adult PT Treatment/Exercise - 05/22/16 0001      Ambulation/Gait   Gait Comments ascend/descend steps with 1 handrail, increased trunk rotation noted, no unsteadiness.      Knee/Hip Exercises: Standing   Heel Raises Left;Right;1 set;20 reps   Heel Raises Limitations single leg    Hip Flexion 20 reps;Both;Knee bent   Forward Step Up Both;2 sets;15 reps;Hand Hold: 1;Step Height: 6"   Wall Squat 2 sets;10 reps             Balance Exercises - 05/22/16 1615      Balance Exercises: Standing   Tandem Stance Eyes open;4 reps;30 secs  EO: Lt 19 sec, Rt: 12 sec max (both)   Tandem Gait Foam/compliant surface;Forward;4 reps  x4 RT on firm; x3 RT on foam            PT Education - 05/22/16 1630    Education provided Yes   Education Details end of POC; information regarding [employer] and  walking program; reviewed final HEP   Person(s) Educated Patient   Methods Explanation;Demonstration;Handout   Comprehension Verbalized understanding;Returned demonstration          PT Short Term Goals - 05/22/16 1602      PT SHORT TERM GOAL #1   Title  After 2 weeks patient will be independent with TKA protocol HEP in order to further progress with ROM and strength at home.   Time 2   Period Weeks   Status Achieved     PT SHORT TERM GOAL #2   Title After 4 weeks, patient will report L Knee pain < 4/10 on a VAS 75% of time in order to improve tolerance with ther ex and ambulation.    Baseline no more than a 5/10, but no longer taking pain medication. Feels it is less frequent.    Time 3   Period Weeks   Status Achieved     PT SHORT TERM GOAL #3   Title After 4 weeks patient will demo improved L knee AAROM to 5-110 degrees in order to improve L knee mobility during funcitonal activities.    Time 3   Period Weeks   Status Achieved     PT SHORT TERM GOAL #4   Title Patient will independently verbalize pain management strategies including use of cryotherapy in order to manage pain levels with ADLs and functional mobility activities.    Time 2   Period Weeks   Status Achieved           PT Long Term Goals - 05/22/16 1603      PT LONG TERM GOAL #1   Title After 8 weeks, patient will report reduced L knee pain to <3/10 on a VAS in order to improve tolerance with ther ex and ambulation.    Baseline 5/10 max   Time 7   Period Weeks   Status Partially Met     PT LONG TERM GOAL #2   Title After 8 weeks, patient will demo improved L knee AAROM to 0-120 degrees in order to improve stair climbing and leisure activities.    Baseline 8/3- 114    Time 7   Period Weeks   Status Not Met     PT LONG TERM GOAL #3   Title After 8 weeks, patient will demo improved L quad and HS strength to >4/5 MMT in order to improve performance with outdoor ambulation and transfers.    Time 7    Period Weeks   Status Not Met     PT LONG TERM GOAL #4   Title After 8 weks, pt will score <11s on TUG Test to demonstrate improved funcitonal strength and activity tolerance to improve safety at home.    Status Achieved     PT LONG TERM GOAL #5   Title Pt will demo improved strength evident by their ability to ascend/descend 6" steps with reciprocal pattern and no handrails with no more than supervision, x3 trials.   Baseline needs to use 2 handrails to descend with good control   Status Partially Met               Plan - 05/22/16 1601    Clinical Impression Statement Pt is being discharged today having made significant improvements in strength, ROM and mobility since beginning PT several weeks ago. Based on formal reassessment last week, she continues to demonstrate hip extensor weakness and limitations in single leg balance. She demonstrates improved functional strength/balance in sit to stand and tug performance. She is consistent and independent at performing her HEP and will continue to see improvements in strength and function. At this time, she is pleased with her progress and is capable of continuing with her exercises at home.    Rehab Potential Good   PT  Frequency Other (comment)  one last session    PT Duration Other (comment)  one last session    PT Treatment/Interventions ADLs/Self Care Home Management;Cryotherapy;Electrical Stimulation;Moist Heat;Balance training;Therapeutic exercise;Therapeutic activities;Functional mobility training;Stair training;Gait training;DME Instruction;Neuromuscular re-education;Patient/family education;Manual techniques;Taping;Passive range of motion;Scar mobilization   PT Next Visit Plan d/c this visit    PT Home Exercise Plan step ups, standing marches, wall squats, SLS   Recommended Other Services none    Consulted and Agree with Plan of Care Patient      Patient will benefit from skilled therapeutic intervention in order to improve  the following deficits and impairments:  Abnormal gait, Decreased range of motion, Difficulty walking, Pain, Decreased balance, Hypomobility, Impaired flexibility, Increased edema, Decreased strength, Decreased mobility, Decreased skin integrity, Decreased activity tolerance, Decreased scar mobility  Visit Diagnosis: Stiffness of left knee, not elsewhere classified  Difficulty in walking, not elsewhere classified  Pain in left knee  Muscle weakness     Problem List Patient Active Problem List   Diagnosis Date Noted  . Postoperative anemia due to acute blood loss 02/08/2016  . Primary localized osteoarthritis of left knee   . Primary localized osteoarthritis of right knee 11/03/2015  . DJD (degenerative joint disease) of knee 11/03/2015  . Rheumatoid arthritis ([place])   . PONV (postoperative nausea and vomiting)   . Seizures ([place])   . Fibromyalgia   . Sleep apnea   . Ventricular septal defect   . History of staph infection   . Refractory migraine 08/10/2014  . Complex partial epilepsy ([place]) 08/10/2014  . S/P tonsillectomy and adenoidectomy 04/01/2014  . Panic anxiety syndrome 06/03/2013  . Positive depression screening 06/03/2013  . Headache(784.0) 06/03/2013  . Unspecified cerebral artery occlusion with cerebral infarction 06/03/2013  . Headache 05/29/2013  . Patent foramen ovale 05/29/2013  . Chest pain 05/29/2013  . Other convulsions 05/29/2013  . Dysphagia, unspecified(787.20) 04/17/2013  . Acute bronchitis 03/27/2013  . Persistent headaches 03/26/2013  . CVA (cerebral infarction) history 03/26/2013  . Hypokalemia 03/26/2013  . Leukocytosis 03/26/2013  . Arthritis 03/26/2013  . QT prolongation 03/26/2013  . Stroke ([place]) 03/26/2013  . Recurrent ventral incisional hernia 04/19/2012  . Obstructive sleep apnea 08/22/2010  . RESTLESS LEGS SYNDROME 08/22/2010  . Moderate intermittent asthma without complication 09/73/5329  . ECZEMA 08/22/2010  . Irritable bowel  syndrome 06/23/2010  . DIARRHEA 06/23/2010  . NAUSEA WITH VOMITING 11/01/2009  . ABDOMINAL PAIN, LOWER 11/01/2009  . KNEE, ARTHRITIS, DEGEN./OSTEO 11/18/2008  . DERANGEMENT MENISCUS 11/02/2008  . JOINT EFFUSION, KNEE 11/02/2008  . KNEE PAIN 11/02/2008   PHYSICAL THERAPY DISCHARGE SUMMARY  Visits from Start of Care: 22  Current functional level related to goals / functional outcomes: Pt with improved BLE strength, knee ROM, functional performance on TUG, 5x sit to stand and balance. See clinical impression and reassessment from last visit for more details.    Remaining deficits: Hip extensor strength 3/5 MMT, single leg balance ~15 sec each. This will continue to improve with advanced HEP adherence   Education / Equipment: [employer], [place] walking program, advanced HEP Plan: Patient agrees to discharge.  Patient goals were partially met. Patient is being discharged due to being pleased with the current functional level.  ?????        4:33 PM,05/22/16 Elly Modena PT, DPT [facility] [place] [address] [place], [place], [address] Phone: [phone]   Fax:  [phone]  Name: [name] MRN:  [id number] Date of Birth: Apr 07, 1961

## 2016-05-25 ENCOUNTER — Ambulatory Visit (HOSPITAL_COMMUNITY): Payer: Medicare Other | Admitting: Physical Therapy

## 2016-08-08 ENCOUNTER — Other Ambulatory Visit: Payer: Self-pay | Admitting: Neurology

## 2016-09-18 ENCOUNTER — Ambulatory Visit (INDEPENDENT_AMBULATORY_CARE_PROVIDER_SITE_OTHER): Payer: Medicare Other | Admitting: Otolaryngology

## 2016-09-18 DIAGNOSIS — H903 Sensorineural hearing loss, bilateral: Secondary | ICD-10-CM

## 2016-09-18 DIAGNOSIS — H7201 Central perforation of tympanic membrane, right ear: Secondary | ICD-10-CM

## 2016-09-18 DIAGNOSIS — H6121 Impacted cerumen, right ear: Secondary | ICD-10-CM | POA: Diagnosis not present

## 2016-09-19 ENCOUNTER — Other Ambulatory Visit: Payer: Self-pay | Admitting: Neurology

## 2016-09-20 ENCOUNTER — Other Ambulatory Visit: Payer: Self-pay

## 2016-09-20 MED ORDER — CITALOPRAM HYDROBROMIDE 20 MG PO TABS: 40.0000 mg | ORAL_TABLET | Freq: Every day | ORAL | 0 refills | Status: DC

## 2016-09-20 MED ORDER — DIVALPROEX SODIUM ER 500 MG PO TB24: ORAL_TABLET | ORAL | 3 refills | Status: DC

## 2016-10-05 ENCOUNTER — Ambulatory Visit (INDEPENDENT_AMBULATORY_CARE_PROVIDER_SITE_OTHER): Payer: Medicare Other | Admitting: Internal Medicine

## 2016-10-05 ENCOUNTER — Encounter: Payer: Self-pay | Admitting: Internal Medicine

## 2016-10-05 VITALS — BP 114/70 | HR 63 | Ht 65.0 in | Wt 149.6 lb

## 2016-10-05 DIAGNOSIS — R0609 Other forms of dyspnea: Secondary | ICD-10-CM

## 2016-10-05 DIAGNOSIS — Z23 Encounter for immunization: Secondary | ICD-10-CM | POA: Diagnosis not present

## 2016-10-05 DIAGNOSIS — G4733 Obstructive sleep apnea (adult) (pediatric): Secondary | ICD-10-CM | POA: Diagnosis not present

## 2016-10-05 DIAGNOSIS — J4520 Mild intermittent asthma, uncomplicated: Secondary | ICD-10-CM | POA: Diagnosis not present

## 2016-10-05 DIAGNOSIS — IMO0001 Reserved for inherently not codable concepts without codable children: Secondary | ICD-10-CM

## 2016-10-05 MED ORDER — UMECLIDINIUM-VILANTEROL 62.5-25 MCG/INH IN AEPB: 1.0000 | INHALATION_SPRAY | Freq: Every day | RESPIRATORY_TRACT | Status: DC

## 2016-10-05 MED ORDER — ALBUTEROL SULFATE (2.5 MG/3ML) 0.083% IN NEBU
2.5000 mg | INHALATION_SOLUTION | Freq: Four times a day (QID) | RESPIRATORY_TRACT | 12 refills | Status: DC | PRN
Start: 2016-10-05 — End: 2016-11-27

## 2016-10-05 MED ORDER — ALBUTEROL SULFATE HFA 108 (90 BASE) MCG/ACT IN AERS: 2.0000 | INHALATION_SPRAY | Freq: Four times a day (QID) | RESPIRATORY_TRACT | 12 refills | Status: DC | PRN

## 2016-10-05 MED ORDER — UMECLIDINIUM-VILANTEROL 62.5-25 MCG/INH IN AEPB: 1.0000 | INHALATION_SPRAY | Freq: Every day | RESPIRATORY_TRACT | 0 refills | Status: DC

## 2016-10-05 NOTE — Patient Instructions (Addendum)
Flu vax- standard  Sample and script to try adding Anoro Ellipta inhaler    Inhale 1 puff, once daily- maintenance inhaler  Refills sent for albuterol inhaler and nebulizer solution  Order- Office spirometry  Dx Dyspnea on exertion  Order- DME  [facility]   Continue CPAP 10, mask of choice, humidifier, supplies, AirView     Dx OSA

## 2016-10-05 NOTE — Progress Notes (Signed)
HPI F former 2ppd/ 60 pkyr smoker seen for OSA, dyspnea/ moderate asthma, complicated by hx CVA PFT 11/03/2013-mild obstructive airways disease with significant response to bronchodilator, normal lung volumes, mild diffusion defect. FVC 3.02/87%, FEV1 2.60/96%, FEV1/FVC 0.86, FEF 25-75% 4.50/174%, TLC 98%, DLCO 69%.  -------------------------------------------  10/05/2015-55 year old female former 2 pack per day/60 pack year smoker seen for OSA, dyspnea/moderate asthma, complicated by history CVA CPAP 10/[facility] DME FOLLOWS FOR:  Pt reports that she has not worn CPAP this week d/t it feeling very drying - humidity level set on 86. Works well when able to wear it, sleeps through the night. Pt wants flu vaccine today - scheduled for knee replacement 11/01/15. Pending right TKR-discussed use of CPAP in hospital. Husband says she does not usually snore through CPAP and she does feel she sleeps better when she uses it. No routine cough or wheeze. Activity is limited by her arthritis pain  10/05/2016-55 year old female former 2 pack per day/60 pack year smoker seen for OSA, dyspnea/moderate asthma/ COPD complicated by history CVA CPAP 10/[facility] DME FOLLOWS FOR:DME [facility]; pt has not been using CPAP in months.  No SD card.  She stopped using CPAP at time of knee surgery. Husband tells her she doesn't sleep as well without it and she is ready to restart. Easy dyspnea on exertion Followed for VSD by cardiologist Dr.Ganji. CXR 10/05/2015-NAD Office Spirometry 10/05/2016-WNL-FVC 3.35/96%, FEV1 2.63/96%, ratio 0.78   ROS-see HPI Constitutional:   No-   weight loss, night sweats, fevers, chills, fatigue, lassitude. HEENT:   + headaches, No-difficulty swallowing, tooth/dental problems, sore throat,       No-  sneezing, itching, ear ache, nasal congestion, post nasal drip,  CV:  No-   chest pain, orthopnea, PND, swelling in lower extremities, anasarca,  dizziness, palpitations Resp:  +shortness of breath with exertion or at rest.              No-   productive cough,  No non-productive cough,  No- coughing up of blood.              No-   change in color of mucus.  +wheezing.   Skin: No-   rash or lesions. GI:  No-   heartburn, indigestion, abdominal pain, nausea, vomiting, GU: . MS:  +joint pain or swelling.  . Neuro-     ?seizures Psych:  No- change in mood or affect. No depression or anxiety.  No memory loss.  OBJ- Physical Exam General- Alert, Oriented, Affect-appropriate, Distress- none acute Skin- rash-none, lesions- none, excoriation- none Lymphadenopathy- none Head- atraumatic            Eyes- Gross vision intact, PERRLA, conjunctivae and secretions clear            Ears- gross hearing normal            Nose- Clear, no-Septal dev, mucus, polyps, erosion, perforation             Throat- Mallampati IV , mucosa clear , drainage- none, tonsils-absent,  Neck- flexible , trachea midline, no stridor , thyroid nl, carotid no bruit Chest - symmetrical excursion , unlabored           Heart/CV- RRR , no murmur heard , no gallop  , no rub, nl s1 s2                           - JVD- none , edema- none, stasis changes-  none, varices- none           Lung- clear to P&A, wheeze- none, cough- none , dullness-none, rub- none           Chest wall-  Abd-  Br/ Gen/ Rectal- Not done, not indicated Extrem- cyanosis- none, clubbing, none, atrophy- none, strength- nl Neuro- grossly intact to observation

## 2016-10-06 ENCOUNTER — Telehealth: Payer: Self-pay

## 2016-10-06 NOTE — Telephone Encounter (Signed)
Received PA request from the drug store for ventolin. I spoke with pharmacist who states their computer automatically sends a PA request. Pharmacist states they changed this Rx to proair. Nothing further needed.

## 2016-10-16 NOTE — Assessment & Plan Note (Signed)
We discussed compliance and goals of CPAP. She realizes she is not sleeping as well without it is ready to restart. Pressure of 10 was comfortable and she has put she needs.

## 2016-10-16 NOTE — Assessment & Plan Note (Signed)
She notices some dyspnea on exertion but not active wheezing recently. Plan-office spirometry done and reviewed with her. Refill albuterol nebulizer solution. Try Anoro Ellipta. Flu vaccine

## 2016-11-06 DIAGNOSIS — Z96653 Presence of artificial knee joint, bilateral: Secondary | ICD-10-CM | POA: Diagnosis not present

## 2016-11-27 ENCOUNTER — Ambulatory Visit (INDEPENDENT_AMBULATORY_CARE_PROVIDER_SITE_OTHER): Payer: Medicare Other | Admitting: Neurology

## 2016-11-27 ENCOUNTER — Encounter: Payer: Self-pay | Admitting: Neurology

## 2016-11-27 VITALS — BP 110/68 | HR 63 | Wt 150.8 lb

## 2016-11-27 DIAGNOSIS — G40209 Localization-related (focal) (partial) symptomatic epilepsy and epileptic syndromes with complex partial seizures, not intractable, without status epilepticus: Secondary | ICD-10-CM

## 2016-11-27 MED ORDER — DIVALPROEX SODIUM ER 500 MG PO TB24: ORAL_TABLET | ORAL | 3 refills | Status: DC

## 2016-11-27 MED ORDER — DIVALPROEX SODIUM ER 500 MG PO TB24: 1500.0000 mg | ORAL_TABLET | Freq: Every day | ORAL | 3 refills | Status: DC

## 2016-11-27 NOTE — Progress Notes (Signed)
[facility]  PATIENT: Gwendolyn [name] [name] DOB: Aug 20, 1961   HISTORY FROM: patient, chart REASON FOR VISIT: routine follow up  HISTORY OF PRESENT ILLNESS:  05/02/13 (PS): 56 year old Caucasian lady being seen today for the first office followup visit for hospital admission for stroke. She presented with sudden onset of unstable gait and dizziness on 03/25/13. CT scan of the head showed equal local right frontal low-density and subsequently MRI scan of the brain confirmed right parietal acute ischemic infarct. MRA of the brain showed diffuse mild intracranial atherosclerotic changes without any large vessel occlusion. Lipid profile and hemoglobin A1c were normal. MRA of the brain showed a tiny incidental 1.2 mm right cavernous internal carotid artery aneurysm. Transthoracic echo showed normal ejection fraction. Carotid ultrasounds were unremarkable. She was not found to have significant vascular risk factors. Except for sleep apnea for which she does use CPAP mask every night. Extensive lab work done in the hospital included basic metabolic panel, ANA, ESR, complement levels, RPR, hypercoagulable panel all of which were negative. She states that she's been having headaches which are almost daily since discharge. She has been taking tramadol which helps but only for short while. She also complains of some intermittent dizziness and some left-sided incoordination which is improving but is not back to normal.  Returned to hospital on 05/29/13, after being found by her husband on the ground rocking back and forth screaming loudly clutching her chest. At the time pt would not answer her husband, just screaming in pain. Pt does not recall what happened. She thinks she lost 3 hours of time where she cannot recollect what happened. Was found in work up to have a PFO and has been enrolled in a study and followed by Dr.Jermarcus Mcfadyen as an outpatient. Patient reports that at baseline she has some difficulties  with reasoning and ambulates with a cane. Reports daily headaches since that time as well.   UPDATE 06/03/13 (LL):  Patient comes in for stroke follow up since last visit on 05/02/13.  She is wearing the cardiac event monitor, will be finished on Friday, sees Dr Einar Gip then.  Headaches are some better, taking 120m Topamax daily at bedtime.  Headache is still every day, taking Tramadol 2-4 pills every day, 2 at a time.  Increased problems with adding, calculations, completing tasks, cooking.  Short term memory problems.  Reports trouble breathing at times and feelings of lightheadedness.  Paresthesias in bilateral hands and feel when standing for long periods.  No prior history of breathing difficulty except for mild asthma.  Tearful in office.  Taking daily aspirin for secondary stroke prevention.  Patient denies medication side effects, with no signs of bleeding or excessive bruising.   Update 08/10/2014 : She returns for clinical follow up visit in the office after last visit more than a year ago. She has noted significant worsening of her migraine headaches in the last several months and now she's been having almost daily headaches. Headaches are severe and disabling constant. They're very in severity from 5-10/10. They're sharp sudden and throbbing in nature. Headaches have increased her physical activity and she has photophobia, nausea and occasional vomiting. The headache is reduced by sleep. She takes 2-4 tablets of tramadol daily with only partial relief. She also takes 2 tablets of hydrocodone 5/325 for her chronic shoulder pain from rotator cuff injury. She is currently on Depakote ER 500 twice daily and Topamax 50 mg twice daily. The Depakote was working quite well initially but in the  last   several months has not worked as well. She also continues to have episodes of possible complex partial seizures. The husband has witnessed several of these states that she start staining is briefly unresponsive  has minor jerking of her extremities and subsequently wets her pants. She had a great response to Depakote when it was initially started but for the last couple of months these have increased in frequency to now almost once a week. She has applied for and gotten Social Security disability but wants me to do some paperwork for her [place] state long-term disability. She continues to followup in the [facility] PFO stroke trial and was last seen in July 2015 Update 05/19/2016 : Patient is seen today for follow-up accompanied by husband. She has now completed parts patient in the [employer] PFO closure trial. She's done well and not had any recurrent stroke or TIA symptoms for 3 years. She has been however complaining of episodes of possible complex partial seizures. She describes this as brief episodes when she is staring off into space and does not respond to questions. These last from 5-10 minutes occasionally she can wet her pants as well as grab objects from her hands. There are no witnessed which is controlling movements on her hands. This occurs at a frequency of once every few weeks. These seem to be triggered by stress. She is currently taking Depakote 500 twice daily. She is also on Topamax 50 mg twice daily for headaches. She states headaches are not severe but she does get posterior headaches as well as to moderate in intensity. She has not been doing well last stress relaxation activities on neck exercises. Update 11/27/2016 : She is seen for follow-up after last visit 6 months ago. She is accompanied by husband. She states she's had no further stroke or TIA symptoms however she continues to have complex partial seizures had a frequency of around 1 month or so. Episodes of typical with patient staring off and appearing disoriented and confused she often wets her pants. His last few minutes. There are no apparent triggers. She did increase the dose of Depakote 2000 and the morning and 1500 at night. She is  tolerating it well without significant side effects. She has not had recent lab work done. She does have mild tremors in her hands but unchanged. She feels her headaches are doing well. She is still gets headaches twice a week or so and she uses Topamax only as needed and not on a scheduled basis. She has not had any recent lipid profile checked though her year ago it was fine. She is starting aspirin well without bleeding or bruising or other side effects. She has not had carotid ultrasound checked in a  couple of years. REVIEW OF SYSTEMS: Full 14 system review of systems performed and notable only for:   Fatigue, ear discharge and pain, cough, wheezing, abdominal pain, rectal bleeding, diarrhea, rectal pain, chest pain, apnea, daytime sleepiness, snoring, sleep In, acting out dreams, frequency of urination, joint swelling, walking difficulty, memory loss, dizziness, headache, seizure, speech difficulty, weakness, tremors, facial drooping, agitation, confusion Allergies  Allergen Reactions  . Aquacel [Carboxymethylcellulose] Other (See Comments)    Blisters   . Nickel   . Sulfonamide Derivatives Anaphylaxis, Hives and Swelling    REACTION: swelling, tongue and hives  . Aspirin Nausea And Vomiting    REACTION: severe gi upset.  Pt states she can tolerate 81 mg asa only.   . Ciprofloxacin Itching and  Swelling    REACTION: angioedema, urticaria  . Cosyntropin Hives and Swelling    REACTION: swelling, hives  . Adhesive [Tape] Other (See Comments)    Blisters skin  . Latex Other (See Comments)    other  . Amoxicillin Nausea And Vomiting    REACTION: GI upset  . Guaifenesin Rash    REACTION: head rash  . Moxifloxacin Other (See Comments)    gi upset  . Nsaids Other (See Comments)    Gi upset    HOME MEDICATIONS: Outpatient Medications Prior to Visit  Medication Sig Dispense Refill  . ALPRAZolam (XANAX) 0.5 MG tablet Take 0.5 mg by mouth 2 (two) times daily as needed for anxiety.     Marland Kitchen  aspirin EC 81 MG tablet Take 81 mg by mouth daily.    . citalopram (CELEXA) 20 MG tablet Take 2 tablets (40 mg total) by mouth daily. 180 tablet 0  . clobetasol ointment (TEMOVATE) 9.24 % Apply 1 application topically 2 (two) times daily as needed (inflammation on feet.).     Marland Kitchen cycloSPORINE (RESTASIS) 0.05 % ophthalmic emulsion Place 4 drops into both eyes 2 (two) times daily.    Marland Kitchen docusate sodium (COLACE) 100 MG capsule 1 tab 2 times a day while on narcotics.  STOOL SOFTENER (Patient taking differently: daily as needed. 1 tab 2 times a day while on narcotics.  STOOL SOFTENER) 60 capsule 0  . fluticasone (FLONASE) 50 MCG/ACT nasal spray Place 2 sprays into both nostrils daily as needed for allergies.     . folic acid (FOLVITE) 1 MG tablet Take 1 mg by mouth daily.    Marland Kitchen gabapentin (NEURONTIN) 300 MG capsule Take 300 mg by mouth 3 (three) times daily.    . Golimumab 100 MG/ML SOAJ Inject into the skin.    Marland Kitchen loratadine (CLARITIN) 10 MG tablet Take 10 mg by mouth daily as needed.     . Methotrexate, PF, 25 MG/0.4ML SOAJ Inject into the skin. Injection every Friday    . Multiple Vitamin (MULTIVITAMIN) capsule Take 1 capsule by mouth daily.     . polyethylene glycol (MIRALAX / GLYCOLAX) packet 17grams in 16 oz of water twice a day until bowel movement.  LAXITIVE.  Restart if two days since last bowel movement 14 each 0  . pravastatin (PRAVACHOL) 20 MG tablet Take 20 mg by mouth daily.    Marland Kitchen topiramate (TOPAMAX) 50 MG tablet TAKE ONE TABLET BY MOUTH TWICE DAILY 180 tablet 0  . umeclidinium-vilanterol (ANORO ELLIPTA) 62.5-25 MCG/INH AEPB Inhale 1 puff into the lungs daily. 60 each 1`2  . divalproex (DEPAKOTE ER) 500 MG 24 hr tablet Take two tablets in morning and three tablest in evening daily 120 tablet 3  . albuterol (PROVENTIL HFA;VENTOLIN HFA) 108 (90 Base) MCG/ACT inhaler Inhale 2 puffs into the lungs every 6 (six) hours as needed for wheezing or shortness of breath. (Patient not taking: Reported on  11/27/2016) 1 each 12  . albuterol (PROVENTIL) (2.5 MG/3ML) 0.083% nebulizer solution Take 3 mLs (2.5 mg total) by nebulization every 6 (six) hours as needed for wheezing or shortness of breath. (Patient not taking: Reported on 11/27/2016) 75 mL 12  . pantoprazole (PROTONIX) 40 MG tablet Take 40 mg by mouth daily.    Marland Kitchen umeclidinium-vilanterol (ANORO ELLIPTA) 62.5-25 MCG/INH AEPB Inhale 1 puff into the lungs daily. (Patient not taking: Reported on 11/27/2016) 1 each 0   No facility-administered medications prior to visit.     PAST MEDICAL HISTORY: Past Medical  History:  Diagnosis Date  . Allergic rhinitis    uses Flonase daily as needed and takes CLaritin daily  . Anxiety    takes Xanax daily as needed  . Asthma    Albuterol inhaler prn;SYmbicort daily  . Cerebral vascular malformation    sees dr Arnoldo Morale for monitoring as needed, sees dr lewitt for headaches every 4 months  . COPD (chronic obstructive pulmonary disease) ([place])   . Depression    takes Citalopram daily  . Diverticulitis at age 43  . Eczema   . Fibromyalgia   . GERD (gastroesophageal reflux disease)    takes Protonix daily  . Hard of hearing   . History of bronchitis as a child   . History of kidney stones   . History of migraine    last one 10+yrs ago  . History of staph infection 456yr ago  . Hyperlipidemia    takes Pravastatin daily  . IBS (irritable bowel syndrome)    mixed  . Insomnia   . Joint pain   . Joint swelling   . Lactose intolerance   . Nausea    takes Zofran daily as needed  . PFO (patent foramen ovale)   . Plaque psoriasis   . Pneumonia [place]   hx of  . PONV (postoperative nausea and vomiting)   . Postoperative anemia due to acute blood loss 02/08/2016  . Primary localized osteoarthritis of left knee   . Primary localized osteoarthritis of right knee 11/03/2015  . Rheumatoid arthritis(714.0) 2010   oa and ra;Rhemicade IV every 6wks and Metotrexate weekly  . Seizures ([id number]  03/21/14   takes Depakote daily  . Shortness of breath    with exertion  . Sleep apnea    study done >[id number];uses CPAP nightly  . Spinal headache    patient states that she thinks she had a spinal headache a long time ago  . Stress incontinence   . Stroke (HCC)[place]11/2014   left sided weakness  . Thyroid cyst   . Ventricular septal defect    2014 TEE lists large right to left shunt ASD/PFO (NO VSD mentioned)    PAST SURGICAL HISTORY: Past Surgical History:  Procedure Laterality Date  . APPENDECTOMY  1981  . CARDIAC CATHETERIZATION  2004  . CHOLECYSTECTOMY  1981  . COLONOSCOPY    . ESOPHAGOGASTRODUODENOSCOPY    . FOOT SURGERY  1999   right ankle  . HERNIA REPAIR  2006   x2  . INCISIONAL HERNIA REPAIR  05/20/2012   Procedure: HERNIA REPAIR INCISIONAL;  Surgeon: ThomJoyice Fasterrnett, MD;  Location: [facility];  Service: General;  Laterality: N/A;  . INCONTINENCE SURGERY  2010   sling done   . KIDNNeedles01  . KNEE ARTHROSCOPY  1992   left  . left foot plating and scarping for arthritis  2011  . right ear tube insertion  2011  . TEE WITHOUT CARDIOVERSION N/A 05/15/2013   Procedure: TRANSESOPHAGEAL ECHOCARDIOGRAM (TEE);  Surgeon: JagaLaverda Page;  [facility]ervice: Cardiovascular;  Laterality: N/A;  . thryoid biopsy    . TONSILLECTOMY AND ADENOIDECTOMY  04/01/2014  . TONSILLECTOMY AND ADENOIDECTOMY Bilateral 04/01/2014   Procedure: BILATERAL TONSILLECTOMY AND ADENOIDECTOMY;  Surgeon: Sui Ascencion Dike;  Location: [facility] OTahomaervice: ENT;  Laterality: Bilateral;  . TOTAL ABDOMINAL HYSTERECTOMY  2001  . TOTAL KNEE ARTHROPLASTY Right 11/03/2015  . TOTAL KNEE ARTHROPLASTY Right 11/03/2015   Procedure: RIGHT TOTAL  KNEE ARTHROPLASTY/RIGHT;  Surgeon: Elsie Saas, MD;  Location: [place];  Service: Orthopedics;  Laterality: Right;  . TOTAL KNEE ARTHROPLASTY Left 02/07/2016   Procedure: TOTAL KNEE ARTHROPLASTY;  Surgeon: Elsie Saas, MD;  Location: [place];  Service:  Orthopedics;  Laterality: Left;  . TUBAL LIGATION  1990    FAMILY HISTORY: Family History  Problem Relation Age of Onset  . Ovarian cancer Mother   . Diabetes Maternal Grandmother   . Arthritis Maternal Grandmother   . Diabetes Brother   . Diabetes Paternal Grandmother   . Diabetes Maternal Grandfather   . Diabetes Paternal Grandfather   . Heart disease Brother   . Heart disease Other     Uncle  . Breast cancer Maternal Aunt     SOCIAL HISTORY: Social History   Social History  . Marital status: Married    Spouse name: Shanon Brow  . Number of children: 3  . Years of education: College   Occupational History  . Chemical engineer   .  [place] History Main Topics  . Smoking status: Former Smoker    Packs/day: 2.00    Years: 30.00    Types: Cigarettes    Quit date: 10/20/1999  . Smokeless tobacco: Never Used     Comment: quit smoking in 2002  . Alcohol use No  . Drug use: No  . Sexual activity: Yes   Other Topics Concern  . Not on file   Social History Narrative   Patient is married with 3 children.   Patient is right handed.   Patient has college education.   Caffeine Use: 1.5 cup of coffee in a.m     PHYSICAL EXAM  Vitals:   11/27/16 1132  BP: 110/68  BP Location: Right Arm  Patient Position: Sitting  Cuff Size: Small  Pulse: 63  Weight: 150 lb 12.8 oz (68.4 kg)   Body mass index is 25.09 kg/m.  Physical Exam  General: well developed, well nourished middle aged Caucasian aldy, seated Head: head normocephalic and atraumatic.   Neck:  no carotid or supraclavicular bruits .mild posterior cervical muscles spasm and tenderness Cardiovascular: regular rate and rhythm, no murmurs  Musculoskeletal: no deformity left shoulder elevation limited by pain Skin: no rash/petichiae   Vascular: Normal pulses all extremities  Neurologic Exam  Mental Status: Awake and fully alert. Oriented to place and time. Recent and remote  memory intact. Attention span, concentration and fund of knowledge appropriate. Mood and affect are   Cranial Nerves: Fundoscopic exam reveals sharp disc margins. Pupils equal, briskly reactive to light. Extraocular movements full without nystagmus. Visual fields show partial left temporal visual field deficit to confrontation. Hearing intact. Facial sensation intact. Face, tongue, palate moves normally and symmetrically.  Motor: Normal bulk and tone. Normal strength in all tested extremity muscles.Diminished fine finger movements on left and orbits right over left upper extremity. Patient has bilateral give-way weakness but no focality   Sensory: intact to touch and pinprick and vibratory.  Coordination: Rapid alternating movements normal in all extremities. Finger-to-nose and heel-to-shin performed accurately bilaterally.  Gait and Station: Arises from chair without difficulty. Stance is normal. Gait demonstrates slow, normal stride length and balance. Able to heel, toe and tandem walk with mild difficulty.  .  Reflexes: 1+ and symmetric. Toes downgoing.  DIAGNOSTIC DATA (LABS, IMAGING, TESTING) - I reviewed patient records, labs, notes, testing and imaging myself where available.  TCD 05/01/13 Positive Transcranial Doppler  Bubble Study indicative of moderate size right to left intracardiac or intrapulmonary shunt.  TEE 05/15/13 Large PFO with strongly positive double contrast for bidirectional shunting both at rest and Valsalva. Marland Kitchen PFO measures 100m and tunnel length 15 mm. Otherwise normal TEE. Ct Head Wo Contrast 05/29/2013  No acute intracranial abnormality is identified. Encephalomalacia in the right parietal lobe is compatible with the parietal lobe infarct that occurred in June 2014. Stable right frontal lobe cavernoma.  EEG ADULT 05/30/13  This is a normal awake and drowsy EEG. Please, be aware that a normal EEG does not exclude the possibility of epilepsy. Clinical correlation is advised.   Mr  Brain Wo Contrast  05/30/2013  Expected evolutionary change related to chronic right parietal infarct. No new infarction is seen. Unchanged right frontal cavernoma.     ASSESSMENT AND PLAN Ms. Gwendolyn [name] LOYDis a 56y.o. female presenting with dizziness, weakness and headache in June 2014 from acute non hemorrhagic small to moderate size right parietal lobe infarct felt to be embolic.  Significant vascular risk factors are sleep apnea and large PFO confirmed by bubble study and TEE.   Headaches post stroke likely mixed migraine and tension headaches which are stable  Complex partial seizures yet suboptimally controlled. I had a long discussion with the patient and her husband regarding her remote stroke and she seems to be stable from cerebral vascular standpoint. Continue aspirin for stroke prevention with strict control of lipids with LDL cholesterol goal below 70 mg percent. Check follow-up carotid ultrasound study. Patient is still having Compex partial seizures hence increase the dose of Depakote ER to 1500 mg twice daily. Check valproic acid level, LFTs and ammonia level. Continue Topamax for migraine prophylaxis but take 50 mg twice daily on a scheduled basis. She will return for follow-up in 6 months or call earlier if necessary.    PAntony Contras MD  11/27/2016, 7:02 PM  [facility] [address] [place] [phone]

## 2016-11-27 NOTE — Patient Instructions (Signed)
I had a long discussion with the patient and her husband regarding her remote stroke and she seems to be stable from cerebral vascular standpoint. Continue aspirin for stroke prevention with strict control of lipids with LDL cholesterol goal below 70 mg percent. Check follow-up carotid ultrasound study. Patient is still having Compex partial seizures hence increase the dose of Depakote ER to 1500 mg twice daily. Check valproic acid level, LFTs and ammonia level. Continue Topamax for migraine prophylaxis but take 50 mg twice daily on a scheduled basis. She will return for follow-up in 6 months or call earlier if necessary.

## 2016-11-28 LAB — VALPROIC ACID LEVEL: Valproic Acid Lvl: 144 ug/mL (ref 50–100)

## 2016-11-28 LAB — HEPATIC FUNCTION PANEL
ALK PHOS: 63 IU/L (ref 39–117)
ALT: 7 IU/L (ref 0–32)
AST: 15 IU/L (ref 0–40)
Albumin: 4.2 g/dL (ref 3.5–5.5)
BILIRUBIN TOTAL: 0.4 mg/dL (ref 0.0–1.2)
BILIRUBIN, DIRECT: 0.04 mg/dL (ref 0.00–0.40)
Total Protein: 6.6 g/dL (ref 6.0–8.5)

## 2016-11-28 LAB — AMMONIA: Ammonia: 107 ug/dL — ABNORMAL HIGH (ref 19–87)

## 2016-12-02 ENCOUNTER — Other Ambulatory Visit: Payer: Self-pay | Admitting: Neurology

## 2016-12-02 DIAGNOSIS — G40209 Localization-related (focal) (partial) symptomatic epilepsy and epileptic syndromes with complex partial seizures, not intractable, without status epilepticus: Secondary | ICD-10-CM

## 2016-12-02 MED ORDER — DIVALPROEX SODIUM ER 500 MG PO TB24: 1000.0000 mg | ORAL_TABLET | Freq: Every day | ORAL | 3 refills | Status: DC

## 2016-12-02 MED ORDER — TOPIRAMATE 50 MG PO TABS: 100.0000 mg | ORAL_TABLET | Freq: Two times a day (BID) | ORAL | 0 refills | Status: DC

## 2016-12-05 ENCOUNTER — Telehealth: Payer: Self-pay

## 2016-12-05 NOTE — Telephone Encounter (Signed)
Rn call patient about her ammonia levels and valproic acid are both high.Rn stated Dr.Sethi did not want to increase her depakote but wanted to decrease it to 1000mg  twice daily and increase topamax to 100 twice daily. Rn stated both medications were sent to the pharmacy last week. Pt verbalized understanding.

## 2016-12-05 NOTE — Telephone Encounter (Signed)
-----   Message from Micki Riley, MD sent at 12/02/2016  9:55 PM EST ----- Kindly inform the patient that valproic acid and ammonia levels are both high hence I do not want to increase depakote dose to 1500 mg twice daily as suggested in my last office visit instead reduce depakote dose to 1000 mg twice daily and increase Topamax dose   to 100 mg twice daily

## 2016-12-06 ENCOUNTER — Other Ambulatory Visit: Payer: Self-pay | Admitting: Neurology

## 2016-12-07 ENCOUNTER — Ambulatory Visit (INDEPENDENT_AMBULATORY_CARE_PROVIDER_SITE_OTHER): Payer: Medicare Other

## 2016-12-07 DIAGNOSIS — I639 Cerebral infarction, unspecified: Secondary | ICD-10-CM | POA: Diagnosis not present

## 2016-12-07 DIAGNOSIS — G40209 Localization-related (focal) (partial) symptomatic epilepsy and epileptic syndromes with complex partial seizures, not intractable, without status epilepticus: Secondary | ICD-10-CM

## 2016-12-11 DIAGNOSIS — L409 Psoriasis, unspecified: Secondary | ICD-10-CM | POA: Diagnosis not present

## 2016-12-11 DIAGNOSIS — Z79899 Other long term (current) drug therapy: Secondary | ICD-10-CM | POA: Diagnosis not present

## 2016-12-11 DIAGNOSIS — M0609 Rheumatoid arthritis without rheumatoid factor, multiple sites: Secondary | ICD-10-CM | POA: Diagnosis not present

## 2016-12-11 DIAGNOSIS — M797 Fibromyalgia: Secondary | ICD-10-CM | POA: Diagnosis not present

## 2016-12-18 IMAGING — US US EXTREM LOW VENOUS*L*
1 series · 14 of 24 positions shown · non-contrast
Comparison: None.

CLINICAL DATA: Left lower extremity pain for 1 month

EXAM:
LEFT LOWER EXTREMITY VENOUS DUPLEX ULTRASOUND
TECHNIQUE: Doppler venous assessment of the left lower extremity deep venous
system was performed, including characterization of spectral flow,
compressibility, and phasicity.

[Series 1: us extrem low venous*left* · 0.08mm/px · 14 of 33 slices shown]
[im 1/33]
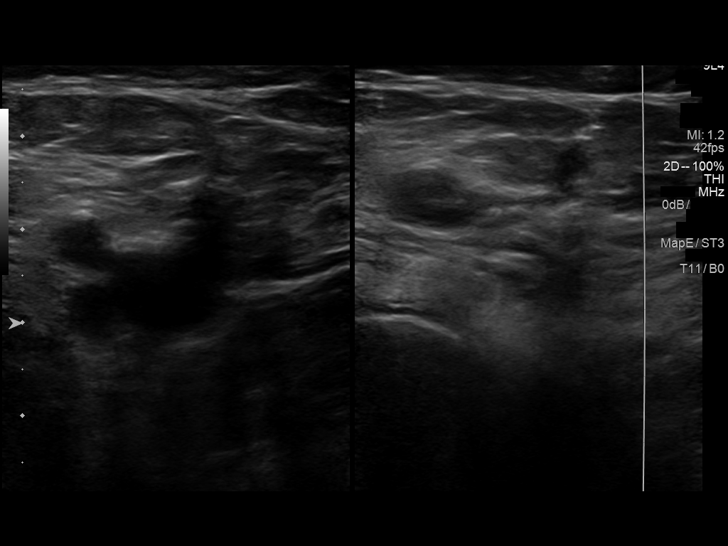
[im 3/33]
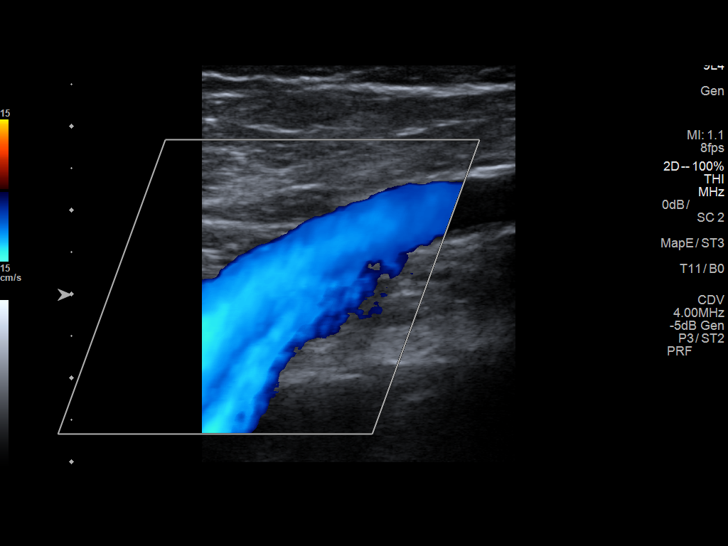
[im 6/33]
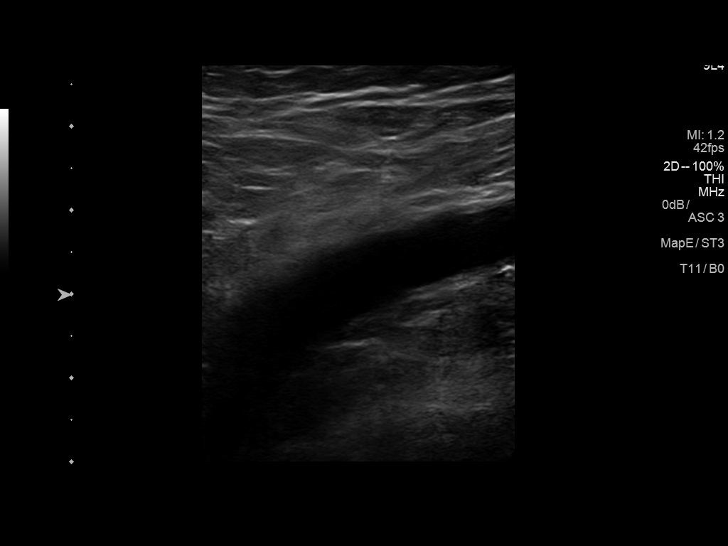
[im 9/33]
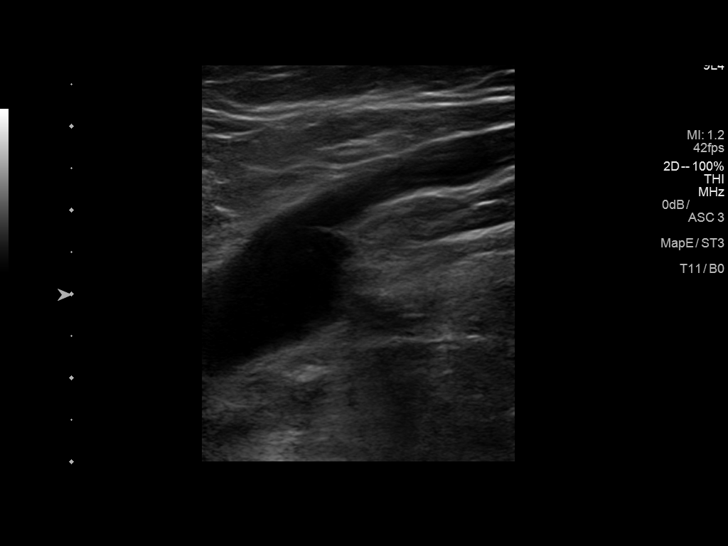
[im 10/33]
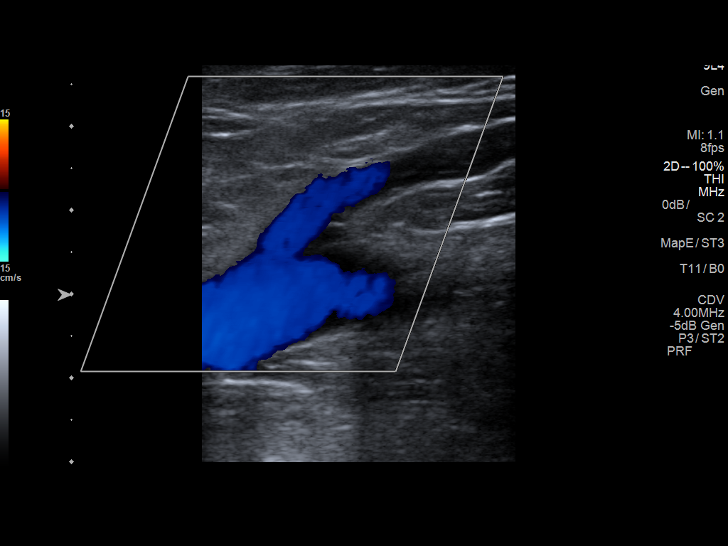
[im 13/33]
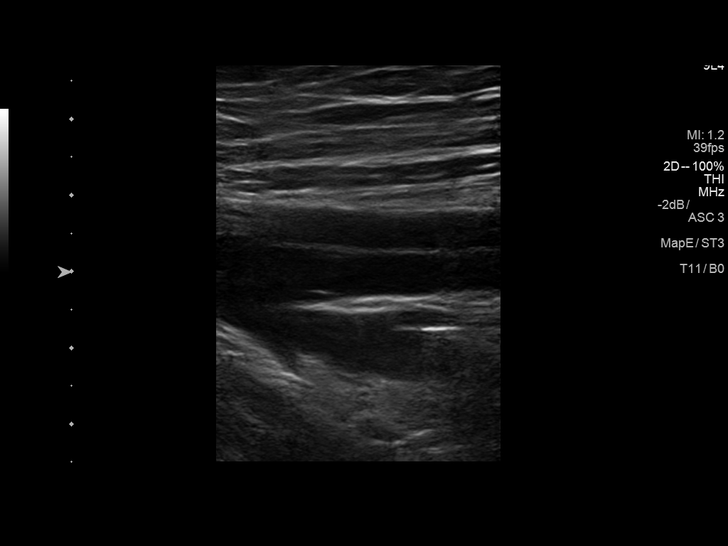
[im 16/33]
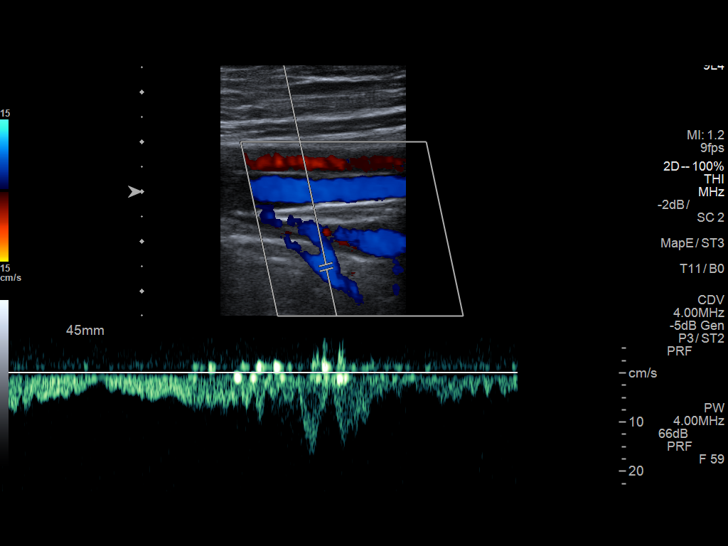
[im 17/33]
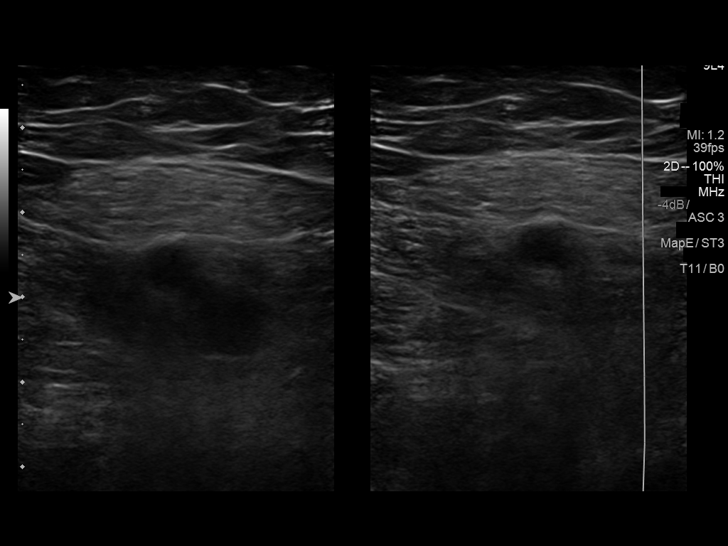
[im 20/33]
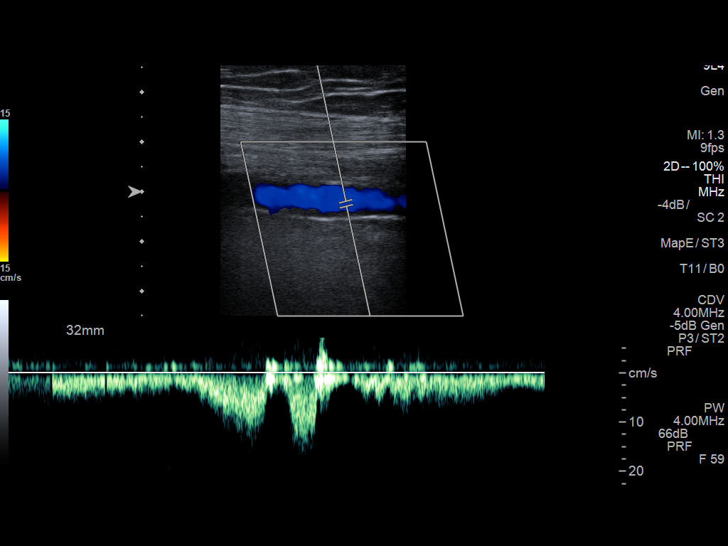
[im 23/33]
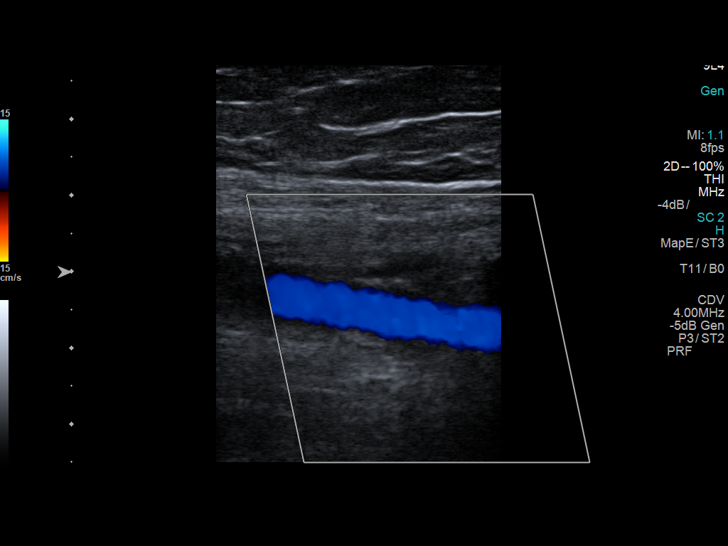
[im 26/33]
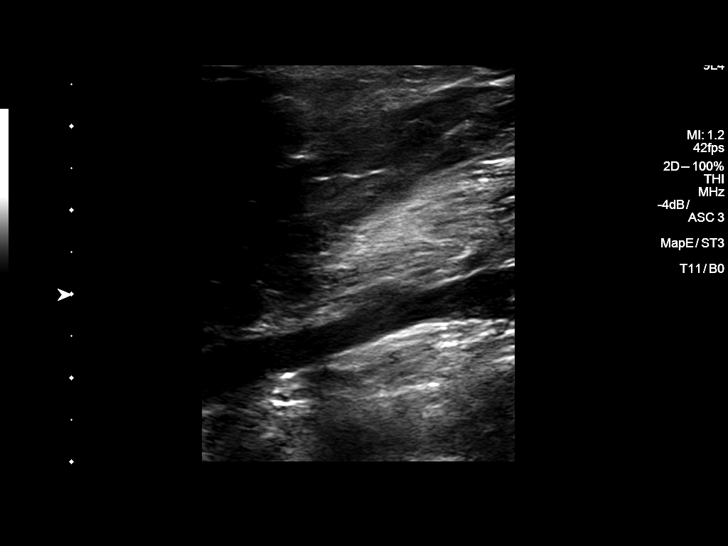
[im 27/33]
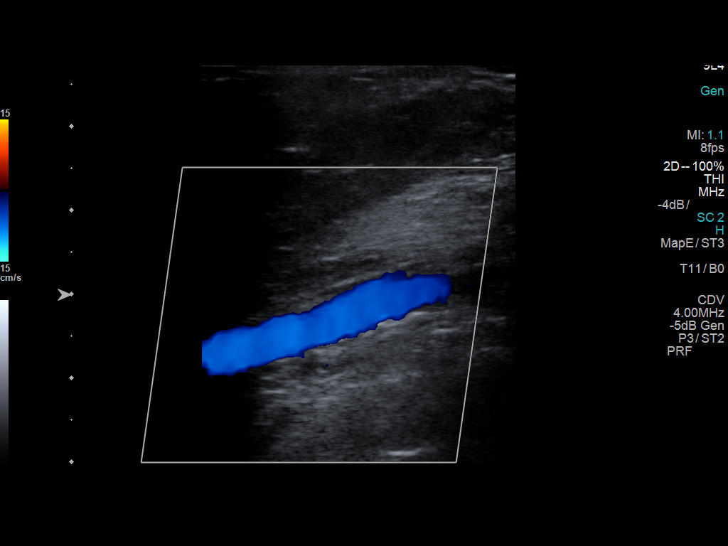
[im 30/33]
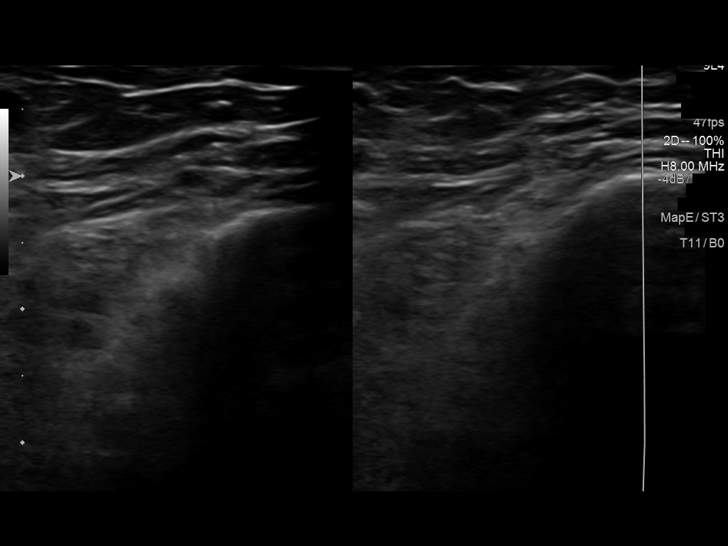
[im 33/33]
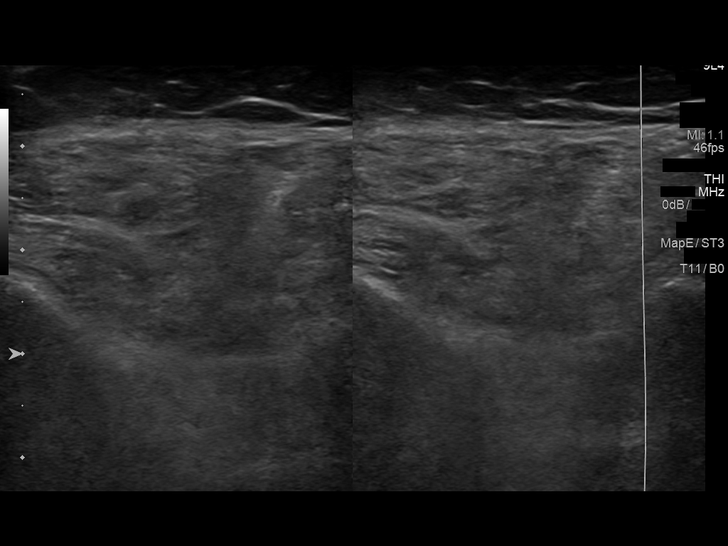

[14 of 24 positions shown; findings below may reference images not displayed]

FINDINGS: There is complete compressibility of the left common femoral,
femoral, and popliteal veins. Doppler analysis demonstrates
respiratory phasicity and augmentation of flow with calf
compression.
IMPRESSION: No evidence of DVT.

## 2016-12-20 ENCOUNTER — Telehealth: Payer: Self-pay

## 2016-12-20 NOTE — Telephone Encounter (Signed)
-----   Message from Micki Riley, MD sent at 12/20/2016  2:27 PM EST ----- Kindly inform the patient that carotid ultrasound study was normal

## 2016-12-20 NOTE — Telephone Encounter (Signed)
Rn call patient that her carotid ultrasound study was normal.Pt verbalized understanding.

## 2017-01-05 ENCOUNTER — Other Ambulatory Visit: Payer: Self-pay | Admitting: Neurology

## 2017-02-06 DIAGNOSIS — Z96653 Presence of artificial knee joint, bilateral: Secondary | ICD-10-CM | POA: Diagnosis not present

## 2017-02-20 ENCOUNTER — Other Ambulatory Visit: Payer: Self-pay | Admitting: Neurology

## 2017-02-21 ENCOUNTER — Other Ambulatory Visit: Payer: Self-pay

## 2017-02-21 MED ORDER — TOPIRAMATE 50 MG PO TABS: 100.0000 mg | ORAL_TABLET | Freq: Two times a day (BID) | ORAL | 1 refills | Status: DC

## 2017-03-13 DIAGNOSIS — M19042 Primary osteoarthritis, left hand: Secondary | ICD-10-CM | POA: Diagnosis not present

## 2017-03-13 DIAGNOSIS — M0609 Rheumatoid arthritis without rheumatoid factor, multiple sites: Secondary | ICD-10-CM | POA: Diagnosis not present

## 2017-03-13 DIAGNOSIS — R21 Rash and other nonspecific skin eruption: Secondary | ICD-10-CM | POA: Diagnosis not present

## 2017-03-13 DIAGNOSIS — Z79899 Other long term (current) drug therapy: Secondary | ICD-10-CM | POA: Diagnosis not present

## 2017-03-13 DIAGNOSIS — M19041 Primary osteoarthritis, right hand: Secondary | ICD-10-CM | POA: Diagnosis not present

## 2017-03-13 DIAGNOSIS — M797 Fibromyalgia: Secondary | ICD-10-CM | POA: Diagnosis not present

## 2017-03-15 ENCOUNTER — Other Ambulatory Visit: Payer: Self-pay | Admitting: Internal Medicine

## 2017-04-04 ENCOUNTER — Telehealth: Payer: Self-pay | Admitting: Internal Medicine

## 2017-04-04 DIAGNOSIS — IMO0001 Reserved for inherently not codable concepts without codable children: Secondary | ICD-10-CM

## 2017-04-04 DIAGNOSIS — J4520 Mild intermittent asthma, uncomplicated: Secondary | ICD-10-CM

## 2017-04-04 NOTE — Telephone Encounter (Signed)
Spoke with the pt  She is needing order sent to DME for neb supplies- tubing and mouthpieces  Order sent to [facility]  Nothing further needed

## 2017-04-09 ENCOUNTER — Other Ambulatory Visit: Payer: Self-pay | Admitting: Neurology

## 2017-04-09 DIAGNOSIS — G40209 Localization-related (focal) (partial) symptomatic epilepsy and epileptic syndromes with complex partial seizures, not intractable, without status epilepticus: Secondary | ICD-10-CM

## 2017-04-10 ENCOUNTER — Other Ambulatory Visit: Payer: Self-pay

## 2017-04-10 DIAGNOSIS — G40209 Localization-related (focal) (partial) symptomatic epilepsy and epileptic syndromes with complex partial seizures, not intractable, without status epilepticus: Secondary | ICD-10-CM

## 2017-04-10 MED ORDER — DIVALPROEX SODIUM ER 500 MG PO TB24: 1000.0000 mg | ORAL_TABLET | Freq: Every day | ORAL | 2 refills | Status: DC

## 2017-04-19 ENCOUNTER — Ambulatory Visit (INDEPENDENT_AMBULATORY_CARE_PROVIDER_SITE_OTHER): Payer: Medicare Other | Admitting: Otolaryngology

## 2017-04-19 DIAGNOSIS — H838X3 Other specified diseases of inner ear, bilateral: Secondary | ICD-10-CM | POA: Diagnosis not present

## 2017-04-19 DIAGNOSIS — H903 Sensorineural hearing loss, bilateral: Secondary | ICD-10-CM

## 2017-04-19 DIAGNOSIS — H6981 Other specified disorders of Eustachian tube, right ear: Secondary | ICD-10-CM

## 2017-04-26 ENCOUNTER — Encounter: Payer: Self-pay | Admitting: Neurology

## 2017-05-29 ENCOUNTER — Ambulatory Visit: Payer: Self-pay | Admitting: Neurology

## 2017-06-21 ENCOUNTER — Ambulatory Visit (INDEPENDENT_AMBULATORY_CARE_PROVIDER_SITE_OTHER): Payer: Medicare Other | Admitting: Otolaryngology

## 2017-07-16 DIAGNOSIS — Z1211 Encounter for screening for malignant neoplasm of colon: Secondary | ICD-10-CM | POA: Diagnosis not present

## 2017-07-16 DIAGNOSIS — R030 Elevated blood-pressure reading, without diagnosis of hypertension: Secondary | ICD-10-CM | POA: Diagnosis not present

## 2017-07-16 DIAGNOSIS — R32 Unspecified urinary incontinence: Secondary | ICD-10-CM | POA: Diagnosis not present

## 2017-07-16 DIAGNOSIS — F419 Anxiety disorder, unspecified: Secondary | ICD-10-CM | POA: Diagnosis not present

## 2017-07-16 DIAGNOSIS — Z0000 Encounter for general adult medical examination without abnormal findings: Secondary | ICD-10-CM | POA: Diagnosis not present

## 2017-07-25 DIAGNOSIS — M545 Low back pain: Secondary | ICD-10-CM | POA: Diagnosis not present

## 2017-07-25 DIAGNOSIS — Z79899 Other long term (current) drug therapy: Secondary | ICD-10-CM | POA: Diagnosis not present

## 2017-07-25 DIAGNOSIS — M549 Dorsalgia, unspecified: Secondary | ICD-10-CM | POA: Diagnosis not present

## 2017-07-25 DIAGNOSIS — Z23 Encounter for immunization: Secondary | ICD-10-CM | POA: Diagnosis not present

## 2017-07-25 DIAGNOSIS — R21 Rash and other nonspecific skin eruption: Secondary | ICD-10-CM | POA: Diagnosis not present

## 2017-07-25 DIAGNOSIS — M0609 Rheumatoid arthritis without rheumatoid factor, multiple sites: Secondary | ICD-10-CM | POA: Diagnosis not present

## 2017-07-25 DIAGNOSIS — M797 Fibromyalgia: Secondary | ICD-10-CM | POA: Diagnosis not present

## 2017-08-02 ENCOUNTER — Other Ambulatory Visit: Payer: Self-pay | Admitting: Neurology

## 2017-08-07 DIAGNOSIS — K5904 Chronic idiopathic constipation: Secondary | ICD-10-CM | POA: Diagnosis not present

## 2017-08-07 DIAGNOSIS — R1033 Periumbilical pain: Secondary | ICD-10-CM | POA: Diagnosis not present

## 2017-08-07 DIAGNOSIS — Z1211 Encounter for screening for malignant neoplasm of colon: Secondary | ICD-10-CM | POA: Diagnosis not present

## 2017-08-07 DIAGNOSIS — R140 Abdominal distension (gaseous): Secondary | ICD-10-CM | POA: Diagnosis not present

## 2017-08-13 DIAGNOSIS — R5383 Other fatigue: Secondary | ICD-10-CM | POA: Diagnosis not present

## 2017-08-13 DIAGNOSIS — R030 Elevated blood-pressure reading, without diagnosis of hypertension: Secondary | ICD-10-CM | POA: Diagnosis not present

## 2017-08-13 DIAGNOSIS — R001 Bradycardia, unspecified: Secondary | ICD-10-CM | POA: Diagnosis not present

## 2017-08-16 DIAGNOSIS — R0602 Shortness of breath: Secondary | ICD-10-CM | POA: Diagnosis not present

## 2017-08-16 DIAGNOSIS — Q211 Atrial septal defect: Secondary | ICD-10-CM | POA: Diagnosis not present

## 2017-08-16 DIAGNOSIS — R0789 Other chest pain: Secondary | ICD-10-CM | POA: Diagnosis not present

## 2017-08-16 DIAGNOSIS — Z8673 Personal history of transient ischemic attack (TIA), and cerebral infarction without residual deficits: Secondary | ICD-10-CM | POA: Diagnosis not present

## 2017-08-27 ENCOUNTER — Other Ambulatory Visit: Payer: Self-pay | Admitting: Cardiology

## 2017-08-27 ENCOUNTER — Ambulatory Visit
Admission: RE | Admit: 2017-08-27 | Discharge: 2017-08-27 | Disposition: A | Discharge status: Home / Self Care | Payer: Medicare Other | Source: Ambulatory Visit | Attending: Cardiology | Admitting: Cardiology

## 2017-08-27 DIAGNOSIS — R0602 Shortness of breath: Secondary | ICD-10-CM

## 2017-08-27 DIAGNOSIS — R0789 Other chest pain: Secondary | ICD-10-CM | POA: Diagnosis not present

## 2017-08-27 DIAGNOSIS — R001 Bradycardia, unspecified: Secondary | ICD-10-CM | POA: Diagnosis not present

## 2017-09-03 DIAGNOSIS — R001 Bradycardia, unspecified: Secondary | ICD-10-CM | POA: Diagnosis not present

## 2017-09-03 DIAGNOSIS — R0602 Shortness of breath: Secondary | ICD-10-CM | POA: Diagnosis not present

## 2017-09-03 DIAGNOSIS — R0789 Other chest pain: Secondary | ICD-10-CM | POA: Diagnosis not present

## 2017-09-17 DIAGNOSIS — R0789 Other chest pain: Secondary | ICD-10-CM | POA: Diagnosis not present

## 2017-09-18 DIAGNOSIS — R0789 Other chest pain: Secondary | ICD-10-CM | POA: Diagnosis not present

## 2017-09-18 DIAGNOSIS — R748 Abnormal levels of other serum enzymes: Secondary | ICD-10-CM | POA: Diagnosis not present

## 2017-09-18 DIAGNOSIS — R0602 Shortness of breath: Secondary | ICD-10-CM | POA: Diagnosis not present

## 2017-09-18 DIAGNOSIS — I10 Essential (primary) hypertension: Secondary | ICD-10-CM | POA: Diagnosis not present

## 2017-10-05 ENCOUNTER — Ambulatory Visit: Payer: Medicare Other | Admitting: Internal Medicine

## 2017-10-23 ENCOUNTER — Other Ambulatory Visit: Payer: Self-pay | Admitting: Cardiology

## 2017-10-23 DIAGNOSIS — Q2112 Patent foramen ovale: Secondary | ICD-10-CM

## 2017-10-23 DIAGNOSIS — I10 Essential (primary) hypertension: Secondary | ICD-10-CM | POA: Diagnosis not present

## 2017-10-23 DIAGNOSIS — R935 Abnormal findings on diagnostic imaging of other abdominal regions, including retroperitoneum: Secondary | ICD-10-CM

## 2017-10-23 DIAGNOSIS — R748 Abnormal levels of other serum enzymes: Secondary | ICD-10-CM | POA: Diagnosis not present

## 2017-10-23 DIAGNOSIS — R079 Chest pain, unspecified: Secondary | ICD-10-CM

## 2017-10-23 DIAGNOSIS — R0602 Shortness of breath: Secondary | ICD-10-CM

## 2017-10-23 DIAGNOSIS — Q211 Atrial septal defect: Secondary | ICD-10-CM

## 2017-10-23 DIAGNOSIS — R0789 Other chest pain: Secondary | ICD-10-CM | POA: Diagnosis not present

## 2017-10-23 DIAGNOSIS — R198 Other specified symptoms and signs involving the digestive system and abdomen: Secondary | ICD-10-CM

## 2017-10-29 ENCOUNTER — Other Ambulatory Visit: Payer: Self-pay | Admitting: Cardiology

## 2017-10-29 DIAGNOSIS — R079 Chest pain, unspecified: Secondary | ICD-10-CM

## 2017-10-29 DIAGNOSIS — R9430 Abnormal result of cardiovascular function study, unspecified: Secondary | ICD-10-CM

## 2017-11-13 ENCOUNTER — Ambulatory Visit (HOSPITAL_COMMUNITY)
Admission: RE | Admit: 2017-11-13 | Discharge: 2017-11-13 | Disposition: A | Discharge status: Home / Self Care | Payer: Medicare Other | Source: Ambulatory Visit | Attending: Cardiology | Admitting: Cardiology

## 2017-11-13 ENCOUNTER — Encounter (HOSPITAL_COMMUNITY): Payer: Self-pay

## 2017-11-13 DIAGNOSIS — Q211 Atrial septal defect: Secondary | ICD-10-CM | POA: Diagnosis not present

## 2017-11-13 DIAGNOSIS — R079 Chest pain, unspecified: Secondary | ICD-10-CM | POA: Diagnosis not present

## 2017-11-13 DIAGNOSIS — R9439 Abnormal result of other cardiovascular function study: Secondary | ICD-10-CM | POA: Diagnosis present

## 2017-11-13 DIAGNOSIS — I700 Atherosclerosis of aorta: Secondary | ICD-10-CM | POA: Insufficient documentation

## 2017-11-13 DIAGNOSIS — R9430 Abnormal result of cardiovascular function study, unspecified: Secondary | ICD-10-CM | POA: Diagnosis not present

## 2017-11-13 MED ORDER — IOPAMIDOL (ISOVUE-370) INJECTION 76%
INTRAVENOUS | Status: AC
  Filled 2017-11-13: qty 100

## 2017-11-13 MED ORDER — NITROGLYCERIN 0.4 MG SL SUBL
0.8000 mg | SUBLINGUAL_TABLET | Freq: Once | SUBLINGUAL | Status: AC
  Administered 2017-11-13: 0.8 mg via SUBLINGUAL
  Filled 2017-11-13: qty 25

## 2017-11-13 MED ORDER — IOPAMIDOL (ISOVUE-370) INJECTION 76%
INTRAVENOUS | Status: AC
  Filled 2017-11-13: qty 75

## 2017-11-13 MED ORDER — IOPAMIDOL (ISOVUE-370) INJECTION 76%
INTRAVENOUS | Status: AC
  Administered 2017-11-13: 75 mL
  Filled 2017-11-13: qty 75

## 2017-11-13 MED ORDER — NITROGLYCERIN 0.4 MG SL SUBL
SUBLINGUAL_TABLET | SUBLINGUAL | Status: AC
  Administered 2017-11-13: 0.8 mg via SUBLINGUAL
  Filled 2017-11-13: qty 2

## 2017-11-22 DIAGNOSIS — M0609 Rheumatoid arthritis without rheumatoid factor, multiple sites: Secondary | ICD-10-CM | POA: Diagnosis not present

## 2017-11-22 DIAGNOSIS — M549 Dorsalgia, unspecified: Secondary | ICD-10-CM | POA: Diagnosis not present

## 2017-11-22 DIAGNOSIS — Z79899 Other long term (current) drug therapy: Secondary | ICD-10-CM | POA: Diagnosis not present

## 2017-11-22 DIAGNOSIS — M797 Fibromyalgia: Secondary | ICD-10-CM | POA: Diagnosis not present

## 2017-11-22 DIAGNOSIS — R21 Rash and other nonspecific skin eruption: Secondary | ICD-10-CM | POA: Diagnosis not present

## 2017-11-22 DIAGNOSIS — R7989 Other specified abnormal findings of blood chemistry: Secondary | ICD-10-CM | POA: Diagnosis not present

## 2017-11-23 DIAGNOSIS — R748 Abnormal levels of other serum enzymes: Secondary | ICD-10-CM | POA: Diagnosis not present

## 2017-11-23 DIAGNOSIS — R0789 Other chest pain: Secondary | ICD-10-CM | POA: Diagnosis not present

## 2017-11-23 DIAGNOSIS — I10 Essential (primary) hypertension: Secondary | ICD-10-CM | POA: Diagnosis not present

## 2017-11-23 DIAGNOSIS — R0602 Shortness of breath: Secondary | ICD-10-CM | POA: Diagnosis not present

## 2017-12-05 DIAGNOSIS — Z201 Contact with and (suspected) exposure to tuberculosis: Secondary | ICD-10-CM | POA: Diagnosis not present

## 2017-12-15 IMAGING — CR DG CHEST 2V
2 series · 2 of 2 positions shown · non-contrast
Comparison: 10/05/2015

CLINICAL DATA: Worsening shortness of breath over the last several
months.

EXAM:
CHEST  2 VIEW

[w chest pa]
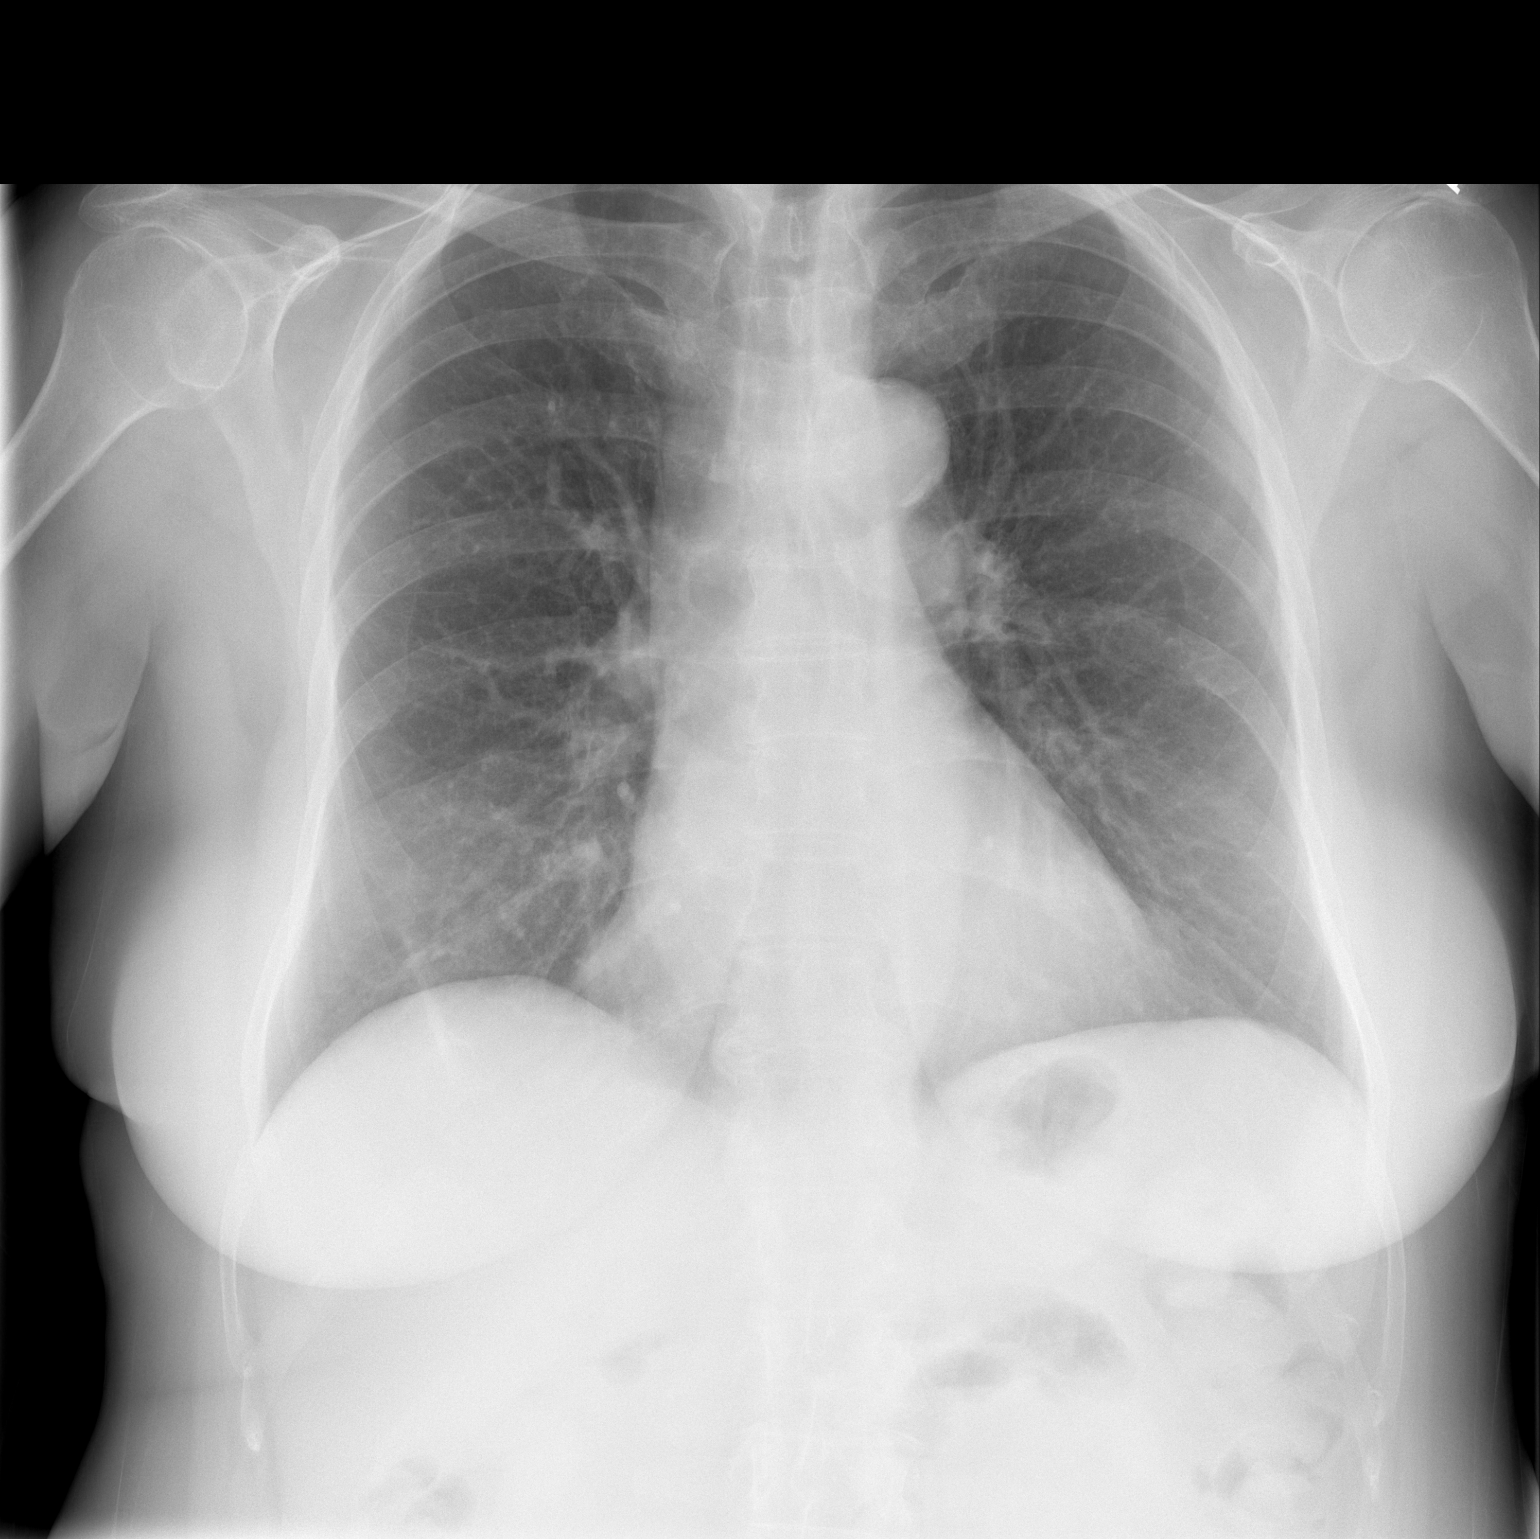

[w chest lat]
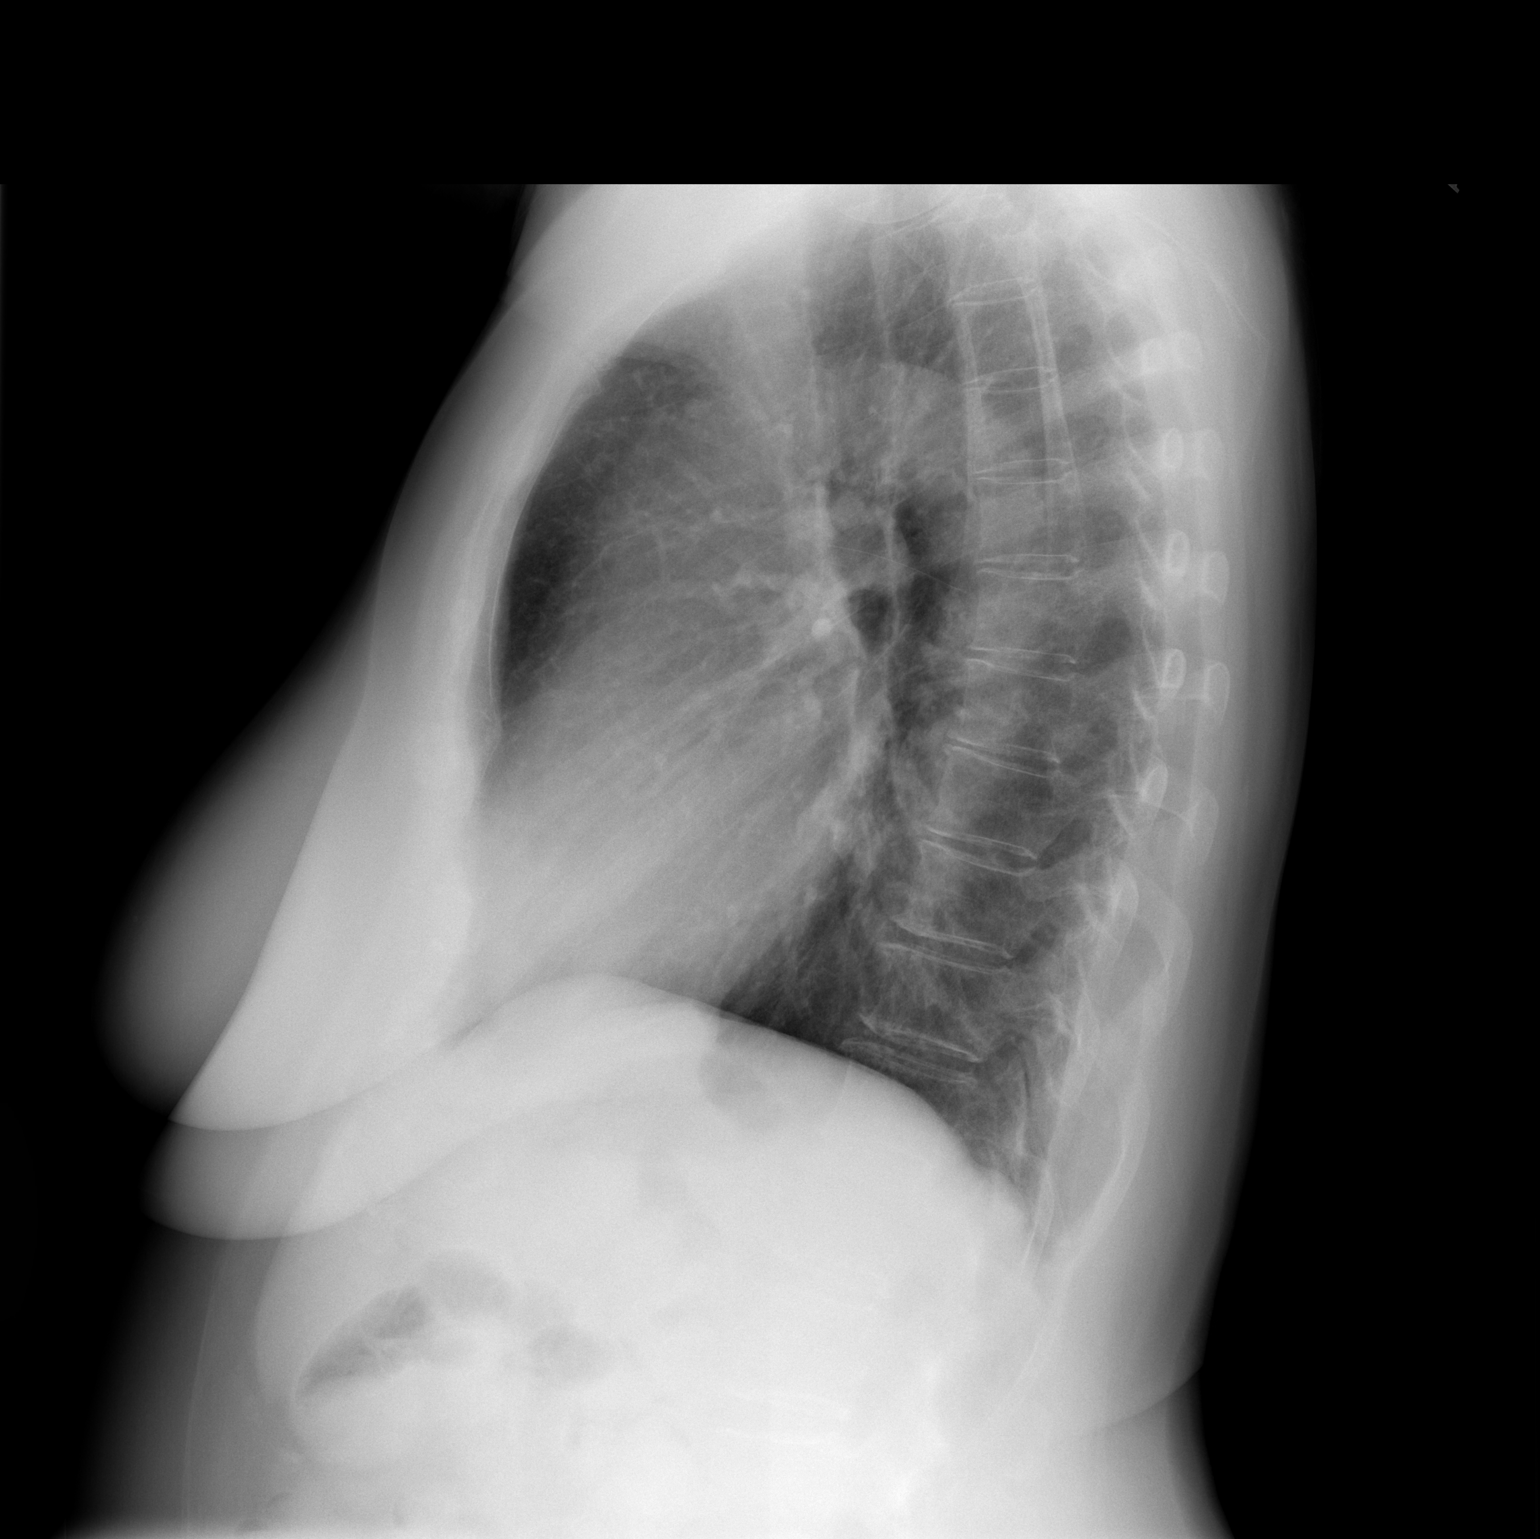

[2 of 2 positions shown; findings below may reference images not displayed]

FINDINGS: Heart size is normal. Aortic atherosclerosis again demonstrated.
Pulmonary vascularity is normal. Lungs are clear. No effusions. No
bone abnormality.
IMPRESSION: Normal examination, with the exception of aortic atherosclerosis.

## 2017-12-24 DIAGNOSIS — M797 Fibromyalgia: Secondary | ICD-10-CM | POA: Diagnosis not present

## 2017-12-24 DIAGNOSIS — M542 Cervicalgia: Secondary | ICD-10-CM | POA: Diagnosis not present

## 2017-12-24 DIAGNOSIS — L4050 Arthropathic psoriasis, unspecified: Secondary | ICD-10-CM | POA: Diagnosis not present

## 2017-12-24 DIAGNOSIS — M47812 Spondylosis without myelopathy or radiculopathy, cervical region: Secondary | ICD-10-CM | POA: Diagnosis not present

## 2017-12-24 DIAGNOSIS — Z79899 Other long term (current) drug therapy: Secondary | ICD-10-CM | POA: Diagnosis not present

## 2017-12-24 DIAGNOSIS — R7989 Other specified abnormal findings of blood chemistry: Secondary | ICD-10-CM | POA: Diagnosis not present

## 2017-12-24 DIAGNOSIS — M0609 Rheumatoid arthritis without rheumatoid factor, multiple sites: Secondary | ICD-10-CM | POA: Diagnosis not present

## 2017-12-24 DIAGNOSIS — R21 Rash and other nonspecific skin eruption: Secondary | ICD-10-CM | POA: Diagnosis not present

## 2017-12-24 DIAGNOSIS — M549 Dorsalgia, unspecified: Secondary | ICD-10-CM | POA: Diagnosis not present

## 2017-12-26 ENCOUNTER — Other Ambulatory Visit: Payer: Self-pay | Admitting: Neurology

## 2017-12-26 DIAGNOSIS — M545 Low back pain: Secondary | ICD-10-CM | POA: Diagnosis not present

## 2017-12-26 DIAGNOSIS — M6281 Muscle weakness (generalized): Secondary | ICD-10-CM | POA: Diagnosis not present

## 2017-12-26 DIAGNOSIS — M542 Cervicalgia: Secondary | ICD-10-CM | POA: Diagnosis not present

## 2017-12-27 DIAGNOSIS — H40033 Anatomical narrow angle, bilateral: Secondary | ICD-10-CM | POA: Diagnosis not present

## 2017-12-27 DIAGNOSIS — H04123 Dry eye syndrome of bilateral lacrimal glands: Secondary | ICD-10-CM | POA: Diagnosis not present

## 2017-12-28 DIAGNOSIS — M545 Low back pain: Secondary | ICD-10-CM | POA: Diagnosis not present

## 2017-12-28 DIAGNOSIS — M542 Cervicalgia: Secondary | ICD-10-CM | POA: Diagnosis not present

## 2017-12-28 DIAGNOSIS — M6281 Muscle weakness (generalized): Secondary | ICD-10-CM | POA: Diagnosis not present

## 2018-01-04 ENCOUNTER — Ambulatory Visit: Payer: Medicare Other | Admitting: Internal Medicine

## 2018-01-21 DIAGNOSIS — I2510 Atherosclerotic heart disease of native coronary artery without angina pectoris: Secondary | ICD-10-CM | POA: Diagnosis not present

## 2018-01-21 DIAGNOSIS — R748 Abnormal levels of other serum enzymes: Secondary | ICD-10-CM | POA: Diagnosis not present

## 2018-01-21 DIAGNOSIS — I2584 Coronary atherosclerosis due to calcified coronary lesion: Secondary | ICD-10-CM | POA: Diagnosis not present

## 2018-01-21 DIAGNOSIS — R0789 Other chest pain: Secondary | ICD-10-CM | POA: Diagnosis not present

## 2018-04-16 ENCOUNTER — Telehealth: Payer: Self-pay | Admitting: Neurology

## 2018-04-16 NOTE — Telephone Encounter (Signed)
Okay, thank you for the FYI

## 2018-04-16 NOTE — Telephone Encounter (Signed)
Pt needs yearly appt for medication refill. An appt was scheduled for 7/12 with Jessica.   FYI

## 2018-04-26 ENCOUNTER — Encounter: Payer: Self-pay | Admitting: Adult Health

## 2018-04-26 ENCOUNTER — Ambulatory Visit (INDEPENDENT_AMBULATORY_CARE_PROVIDER_SITE_OTHER): Payer: Medicare Other | Admitting: Adult Health

## 2018-04-26 VITALS — BP 128/75 | HR 41 | Ht 64.5 in | Wt 164.2 lb

## 2018-04-26 DIAGNOSIS — Z8673 Personal history of transient ischemic attack (TIA), and cerebral infarction without residual deficits: Secondary | ICD-10-CM | POA: Diagnosis not present

## 2018-04-26 DIAGNOSIS — G40209 Localization-related (focal) (partial) symptomatic epilepsy and epileptic syndromes with complex partial seizures, not intractable, without status epilepticus: Secondary | ICD-10-CM

## 2018-04-26 DIAGNOSIS — R569 Unspecified convulsions: Secondary | ICD-10-CM

## 2018-04-26 DIAGNOSIS — Z79899 Other long term (current) drug therapy: Secondary | ICD-10-CM | POA: Diagnosis not present

## 2018-04-26 MED ORDER — CITALOPRAM HYDROBROMIDE 20 MG PO TABS: 40.0000 mg | ORAL_TABLET | Freq: Every day | ORAL | 4 refills | Status: DC

## 2018-04-26 MED ORDER — DIVALPROEX SODIUM ER 500 MG PO TB24: 1000.0000 mg | ORAL_TABLET | Freq: Every day | ORAL | 4 refills | Status: DC

## 2018-04-26 MED ORDER — TOPIRAMATE 100 MG PO TABS: 100.0000 mg | ORAL_TABLET | Freq: Two times a day (BID) | ORAL | 4 refills | Status: DC

## 2018-04-26 NOTE — Patient Instructions (Signed)
Continue aspirin 81 mg daily  and zocor  for secondary stroke prevention  Continue to follow up with PCP regarding general management    Continue to follow up with cardiologist   Increase Topamax to 100mg  twice a day  Continue current dose of depakote 500 AM and 500 PM - we will check Depakote, ammonia, liver function tests, CBC and CMP    Continue to maintain seizure precautions   Continue to monitor blood pressure at home  Maintain strict control of hypertension with blood pressure goal below 130/90, diabetes with hemoglobin A1c goal below 6.5% and cholesterol with LDL cholesterol (bad cholesterol) goal below 70 mg/dL. I also advised the patient to eat a healthy diet with plenty of whole grains, cereals, fruits and vegetables, exercise regularly and maintain ideal body weight.  Followup in the future with me in 1 year or call earlier if needed        Thank you for coming to see at [facility]. I hope we have been able to provide you high quality care today.  You may receive a patient satisfaction survey over the next few weeks. We would appreciate your feedback and comments so that we may continue to improve ourselves and the health of our patients.

## 2018-04-26 NOTE — Progress Notes (Signed)
I agree with the above plan 

## 2018-04-26 NOTE — Progress Notes (Signed)
[facility]  PATIENT: [name] DOB: 22-Apr-1961   HISTORY FROM: patient, chart REASON FOR VISIT: routine follow up  HISTORY OF PRESENT ILLNESS:  05/02/13 (PS): 57 year old Caucasian lady being seen today for the first office followup visit for hospital admission for stroke. She presented with sudden onset of unstable gait and dizziness on 03/25/13. CT scan of the head showed equal local right frontal low-density and subsequently MRI scan of the brain confirmed right parietal acute ischemic infarct. MRA of the brain showed diffuse mild intracranial atherosclerotic changes without any large vessel occlusion. Lipid profile and hemoglobin A1c were normal. MRA of the brain showed a tiny incidental 1.2 mm right cavernous internal carotid artery aneurysm. Transthoracic echo showed normal ejection fraction. Carotid ultrasounds were unremarkable. She was not found to have significant vascular risk factors. Except for sleep apnea for which she does use CPAP mask every night. Extensive lab work done in the hospital included basic metabolic panel, ANA, ESR, complement levels, RPR, hypercoagulable panel all of which were negative. She states that she's been having headaches which are almost daily since discharge. She has been taking tramadol which helps but only for short while. She also complains of some intermittent dizziness and some left-sided incoordination which is improving but is not back to normal.  Returned to hospital on 05/29/13, after being found by her husband on the ground rocking back and forth screaming loudly clutching her chest. At the time pt would not answer her husband, just screaming in pain. Pt does not recall what happened. She thinks she lost 3 hours of time where she cannot recollect what happened. Was found in work up to have a PFO and has been enrolled in a study and followed by Dr.Sethi as an outpatient. Patient reports that at baseline she has some difficulties  with reasoning and ambulates with a cane. Reports daily headaches since that time as well.   UPDATE 06/03/13 (LL):  Patient comes in for stroke follow up since last visit on 05/02/13.  She is wearing the cardiac event monitor, will be finished on Friday, sees Dr Einar Gip then.  Headaches are some better, taking 133m Topamax daily at bedtime.  Headache is still every day, taking Tramadol 2-4 pills every day, 2 at a time.  Increased problems with adding, calculations, completing tasks, cooking.  Short term memory problems.  Reports trouble breathing at times and feelings of lightheadedness.  Paresthesias in bilateral hands and feel when standing for long periods.  No prior history of breathing difficulty except for mild asthma.  Tearful in office.  Taking daily aspirin for secondary stroke prevention.  Patient denies medication side effects, with no signs of bleeding or excessive bruising.   08/10/2014 visit PS: She returns for clinical follow up visit in the office after last visit more than a year ago. She has noted significant worsening of her migraine headaches in the last several months and now she's been having almost daily headaches. Headaches are severe and disabling constant. They're very in severity from 5-10/10. They're sharp sudden and throbbing in nature. Headaches have increased her physical activity and she has photophobia, nausea and occasional vomiting. The headache is reduced by sleep. She takes 2-4 tablets of tramadol daily with only partial relief. She also takes 2 tablets of hydrocodone 5/325 for her chronic shoulder pain from rotator cuff injury. She is currently on Depakote ER 500 twice daily and Topamax 50 mg twice daily. The Depakote was working quite well initially but in the  last   several months has not worked as well. She also continues to have episodes of possible complex partial seizures. The husband has witnessed several of these states that she start staining is briefly unresponsive  has minor jerking of her extremities and subsequently wets her pants. She had a great response to Depakote when it was initially started but for the last couple of months these have increased in frequency to now almost once a week. She has applied for and gotten Social Security disability but wants me to do some paperwork for her [place] state long-term disability. She continues to followup in the [facility] PFO stroke trial and was last seen in July 2015 05/19/2016 visit PS : Patient is seen today for follow-up accompanied by husband. She has now completed parts patient in the [employer] PFO closure trial. She's done well and not had any recurrent stroke or TIA symptoms for 3 years. She has been however complaining of episodes of possible complex partial seizures. She describes this as brief episodes when she is staring off into space and does not respond to questions. These last from 5-10 minutes occasionally she can wet her pants as well as grab objects from her hands. There are no witnessed which is controlling movements on her hands. This occurs at a frequency of once every few weeks. These seem to be triggered by stress. She is currently taking Depakote 500 twice daily. She is also on Topamax 50 mg twice daily for headaches. She states headaches are not severe but she does get posterior headaches as well as to moderate in intensity. She has not been doing well last stress relaxation activities on neck exercises. 11/27/2016 visit PS : She is seen for follow-up after last visit 6 months ago. She is accompanied by husband. She states she's had no further stroke or TIA symptoms however she continues to have complex partial seizures had a frequency of around 1 month or so. Episodes of typical with patient staring off and appearing disoriented and confused she often wets her pants. His last few minutes. There are no apparent triggers. She did increase the dose of Depakote 2000 and the morning and 1500 at night. She  is tolerating it well without significant side effects. She has not had recent lab work done. She does have mild tremors in her hands but unchanged. She feels her headaches are doing well. She is still gets headaches twice a week or so and she uses Topamax only as needed and not on a scheduled basis. She has not had any recent lipid profile checked though her year ago it was fine. She is starting aspirin well without bleeding or bruising or other side effects. She has not had carotid ultrasound checked in a  couple of years.  04/26/18 UPDATE: Patient is being seen today for one-year follow-up.  She recently ran out of her Depakote approximately 2 weeks ago and since that time has been having increasing seizures.  She describes the seizures as staring episodes that last approximately 5 minutes and does have some confusion after.  She did urinate one time with 1 of these episodes but since running out of the Depakote this has been increased.  She otherwise has been compliant with Depakote taking 500 mg in the morning and 500 mg in the afternoon.  She did not increase Topamax due to misunderstanding and currently only taking 50 mg in the morning and 50 mg in the afternoon but was recommended to take 100 mg  in the morning and 100 mg in the evening.  Prior to running out of her Depakote, she was having approximately 2 episodes a month that consisted of staring lasting for approximately 5 minutes minutes with some confusion afterwards.  She continues to see Dr. Einar Gip for maintenance of blood pressure and cholesterol.  She will be following with Dr. Collene Mares and GI for colonoscopy within the next month but did advise that patient should be back on her full levels of Depakote and seizures under control prior to this happening.  Continues to take aspirin without side effects of bleeding or bruising.  Continues to take Zocor without side effects of myalgias.  No other concerns at this time.   REVIEW OF SYSTEMS: Full 14 system  review of systems performed and notable only for:   Appetite change, chills, fever, ear discharge, hearing loss, eye itching, light sensitivity, blurred vision, wheezing, shortness of breath, chest tightness, chest pain, palpitations, cold intolerance, heat intolerance, cuts patient, diarrhea, nausea, vomiting, restless leg, insomnia, apnea, sleep talking, joint swelling, back pain, neck pain, moles, itching, memory loss, dizziness, headache, numbness, seizure, weakness, confusion, depression, and nervous/anxious   Allergies  Allergen Reactions  . Aquacel [Carboxymethylcellulose] Other (See Comments)    Blisters   . Nickel   . Sulfonamide Derivatives Anaphylaxis, Hives and Swelling    REACTION: swelling, tongue and hives  . Aspirin Nausea And Vomiting    REACTION: severe gi upset.  Pt states she can tolerate 81 mg asa only.   . Ciprofloxacin Itching and Swelling    REACTION: angioedema, urticaria  . Cosyntropin Hives and Swelling    REACTION: swelling, hives  . Adhesive [Tape] Other (See Comments)    Blisters skin  . Latex Other (See Comments)    other  . Amoxicillin Nausea And Vomiting    REACTION: GI upset  . Guaifenesin Rash    REACTION: head rash  . Moxifloxacin Other (See Comments)    gi upset  . Nsaids Other (See Comments)    Gi upset    HOME MEDICATIONS: Outpatient Medications Prior to Visit  Medication Sig Dispense Refill  . ALPRAZolam (XANAX) 0.5 MG tablet Take 0.5 mg by mouth 2 (two) times daily as needed for anxiety.     Marland Kitchen amLODipine (NORVASC) 5 MG tablet Take 5 mg by mouth daily.    Marland Kitchen aspirin EC 81 MG tablet Take 81 mg by mouth daily.    . clobetasol ointment (TEMOVATE) 2.68 % Apply 1 application topically 2 (two) times daily as needed (inflammation on feet.).     Marland Kitchen fluticasone (FLONASE) 50 MCG/ACT nasal spray Place 2 sprays into both nostrils daily as needed for allergies.     Marland Kitchen Lifitegrast (XIIDRA) 5 % SOLN Apply 1 drop to eye 2 (two) times daily.    Marland Kitchen loratadine  (CLARITIN) 10 MG tablet Take 10 mg by mouth daily as needed.     . Multiple Vitamin (MULTIVITAMIN) capsule Take 1 capsule by mouth daily.     Marland Kitchen OTEZLA 30 MG TABS Take 1 tablet by mouth 2 (two) times daily.    . simvastatin (ZOCOR) 20 MG tablet Take 20 mg by mouth daily.    . VOLTAREN 1 % GEL     . citalopram (CELEXA) 20 MG tablet TAKE TWO TABLETS BY MOUTH DAILY 90 tablet 0  . divalproex (DEPAKOTE ER) 500 MG 24 hr tablet Take 2 tablets (1,000 mg total) by mouth daily. 360 tablet 2  . topiramate (TOPAMAX) 50 MG tablet TAKE  TWO TABLETS BY MOUTH TWICE DAILY 360 tablet 2  . ANORO ELLIPTA 62.5-25 MCG/INH AEPB USE 1 INHALATION DAILY 60 each 5  . cycloSPORINE (RESTASIS) 0.05 % ophthalmic emulsion Place 4 drops into both eyes 2 (two) times daily.    Marland Kitchen docusate sodium (COLACE) 100 MG capsule 1 tab 2 times a day while on narcotics.  STOOL SOFTENER (Patient taking differently: daily as needed. 1 tab 2 times a day while on narcotics.  STOOL SOFTENER) 60 capsule 0  . Ferrous Gluconate (IRON 27 PO) Take 27 mg by mouth daily.    . folic acid (FOLVITE) 1 MG tablet Take 1 mg by mouth daily.    Marland Kitchen gabapentin (NEURONTIN) 300 MG capsule Take 300 mg by mouth 3 (three) times daily.    . Golimumab 100 MG/ML SOAJ Inject into the skin.    . Methotrexate, PF, 25 MG/0.4ML SOAJ Inject into the skin. Injection every Friday    . polyethylene glycol (MIRALAX / GLYCOLAX) packet 17grams in 16 oz of water twice a day until bowel movement.  LAXITIVE.  Restart if two days since last bowel movement 14 each 0  . pravastatin (PRAVACHOL) 20 MG tablet Take 20 mg by mouth daily.     No facility-administered medications prior to visit.     PAST MEDICAL HISTORY: Past Medical History:  Diagnosis Date  . Allergic rhinitis    uses Flonase daily as needed and takes CLaritin daily  . Anxiety    takes Xanax daily as needed  . Asthma    Albuterol inhaler prn;SYmbicort daily  . Cerebral vascular malformation    sees dr Arnoldo Morale for  monitoring as needed, sees dr lewitt for headaches every 4 months  . COPD (chronic obstructive pulmonary disease) ([place])   . Depression    takes Citalopram daily  . Diverticulitis at age 31  . Eczema   . Fibromyalgia   . GERD (gastroesophageal reflux disease)    takes Protonix daily  . Hard of hearing   . History of bronchitis as a child   . History of kidney stones   . History of migraine    last one 10+yrs ago  . History of staph infection 64yr ago  . Hyperlipidemia    takes Pravastatin daily  . IBS (irritable bowel syndrome)    mixed  . Insomnia   . Joint pain   . Joint swelling   . Lactose intolerance   . Nausea    takes Zofran daily as needed  . PFO (patent foramen ovale)   . Plaque psoriasis   . Pneumonia 234yrago   hx of  . PONV (postoperative nausea and vomiting)   . Postoperative anemia due to acute blood loss 02/08/2016  . Primary localized osteoarthritis of left knee   . Primary localized osteoarthritis of right knee 11/03/2015  . Rheumatoid arthritis(714.0) 2010   oa and ra;Rhemicade IV every 6wks and Metotrexate weekly  . Seizures ([id number] 03/21/14   takes Depakote daily  . Shortness of breath    with exertion  . Sleep apnea    study done >[id number];uses CPAP nightly  . Spinal headache    patient states that she thinks she had a spinal headache a long time ago  . Stress incontinence   . Stroke (HCC)[place]   left sided weakness  . Thyroid cyst   . Ventricular septal defect    2014 TEE lists large right to left shunt ASD/PFO (NO VSD mentioned)    PAST  SURGICAL HISTORY: Past Surgical History:  Procedure Laterality Date  . APPENDECTOMY  1981  . CARDIAC CATHETERIZATION  2004  . CHOLECYSTECTOMY  1981  . COLONOSCOPY    . ESOPHAGOGASTRODUODENOSCOPY    . FOOT SURGERY  1999   right ankle  . HERNIA REPAIR  2006   x2  . INCISIONAL HERNIA REPAIR  05/20/2012   Procedure: HERNIA REPAIR INCISIONAL;  Surgeon: Joyice Faster. Cornett, MD;  Location: [facility];   Service: General;  Laterality: N/A;  . INCONTINENCE SURGERY  2010   sling done   . [place]  2001  . KNEE ARTHROSCOPY  1992   left  . left foot plating and scarping for arthritis  2011  . right ear tube insertion  2011  . TEE WITHOUT CARDIOVERSION N/A 05/15/2013   Procedure: TRANSESOPHAGEAL ECHOCARDIOGRAM (TEE);  Surgeon: Laverda Page, MD;  Location: [facility];  Service: Cardiovascular;  Laterality: N/A;  . thryoid biopsy    . TONSILLECTOMY AND ADENOIDECTOMY  04/01/2014  . TONSILLECTOMY AND ADENOIDECTOMY Bilateral 04/01/2014   Procedure: BILATERAL TONSILLECTOMY AND ADENOIDECTOMY;  Surgeon: Ascencion Dike, MD;  Location: [facility];  Service: ENT;  Laterality: Bilateral;  . TOTAL ABDOMINAL HYSTERECTOMY  2001  . TOTAL KNEE ARTHROPLASTY Right 11/03/2015  . TOTAL KNEE ARTHROPLASTY Right 11/03/2015   Procedure: RIGHT TOTAL KNEE ARTHROPLASTY/RIGHT;  Surgeon: Elsie Saas, MD;  Location: [facility];  Service: Orthopedics;  Laterality: Right;  . TOTAL KNEE ARTHROPLASTY Left 02/07/2016   Procedure: TOTAL KNEE ARTHROPLASTY;  Surgeon: Elsie Saas, MD;  Location: [facility];  Service: Orthopedics;  Laterality: Left;  . TUBAL LIGATION  1990    FAMILY HISTORY: Family History  Problem Relation Age of Onset  . Ovarian cancer Mother   . Diabetes Maternal Grandmother   . Arthritis Maternal Grandmother   . Diabetes Brother   . Diabetes Paternal Grandmother   . Diabetes Maternal Grandfather   . Diabetes Paternal Grandfather   . Heart disease Brother   . Heart disease Other        Uncle  . Breast cancer Maternal Aunt     SOCIAL HISTORY: Social History   Socioeconomic History  . Marital status: Married    Spouse name: Shanon Brow  . Number of children: 3  . Years of education: College  . Highest education level: Not on file  Occupational History  . Occupation: Careers adviser    Comment: Theatre manager     Employer: [employer]  Social Needs  . Financial resource strain: Not on  file  . Food insecurity:    Worry: Not on file    Inability: Not on file  . Transportation needs:    Medical: Not on file    Non-medical: Not on file  Tobacco Use  . Smoking status: Former Smoker    Packs/day: 2.00    Years: 30.00    Pack years: 60.00    Types: Cigarettes    Last attempt to quit: 10/20/1999    Years since quitting: 18.5  . Smokeless tobacco: Never Used  . Tobacco comment: quit smoking in 2002  Substance and Sexual Activity  . Alcohol use: No  . Drug use: No  . Sexual activity: Yes  Lifestyle  . Physical activity:    Days per week: Not on file    Minutes per session: Not on file  . Stress: Not on file  Relationships  . Social connections:    Talks on phone: Not on file    Gets together:  Not on file    Attends religious service: Not on file    Active member of club or organization: Not on file    Attends meetings of clubs or organizations: Not on file    Relationship status: Not on file  . Intimate partner violence:    Fear of current or ex partner: Not on file    Emotionally abused: Not on file    Physically abused: Not on file    Forced sexual activity: Not on file  Other Topics Concern  . Not on file  Social History Narrative   Patient is married with 3 children.   Patient is right handed.   Patient has college education.   Caffeine Use: 1.5 cup of coffee in a.m     PHYSICAL EXAM  Vitals:   04/26/18 0923  BP: 128/75  Pulse: (!) 41  Weight: 164 lb 3.2 oz (74.5 kg)  Height: 5' 4.5" (1.638 m)   Body mass index is 27.75 kg/m.  Physical Exam  General: well developed, well nourished pleasant middle aged Caucasian aldy, seated Head: head normocephalic and atraumatic.   Neck:  no carotid or supraclavicular bruits .mild posterior cervical muscles spasm and tenderness Cardiovascular: regular rate and rhythm, no murmurs  Musculoskeletal: no deformity left shoulder elevation limited by pain Skin: no rash/petichiae   Vascular: Normal pulses all  extremities  Neurologic Exam  Mental Status: Awake and fully alert. Oriented to place and time. Recent and remote memory intact. Attention span, concentration and fund of knowledge appropriate. Mood and affect are   Cranial Nerves: Fundoscopic exam reveals sharp disc margins. Pupils equal, briskly reactive to light. Extraocular movements full without nystagmus. Visual fields show partial left temporal visual field deficit to confrontation. Hearing intact. Facial sensation intact. Face, tongue, palate moves normally and symmetrically.  Motor: Normal bulk and tone. Normal strength in all tested extremity muscles.Diminished fine finger movements on left and orbits right over left upper extremity. Patient has bilateral give-way weakness but no focality   Sensory: intact to touch and pinprick and vibratory.  Coordination: Rapid alternating movements normal in all extremities. Finger-to-nose and heel-to-shin performed accurately bilaterally.  Gait and Station: Arises from chair without difficulty. Stance is normal. Gait demonstrates slow, normal stride length and balance. Able to heel, toe and tandem walk with mild difficulty.  .  Reflexes: 1+ and symmetric. Toes downgoing.     ASSESSMENT AND PLAN Ms. TWANA [name] is a 57 y.o. female presenting with dizziness, weakness and headache in June 2014 from acute non hemorrhagic small to moderate size right parietal lobe infarct felt to be embolic.  Significant vascular risk factors are sleep apnea and large PFO confirmed by bubble study and TEE.  Patient returns today for follow up visit.   -Continue aspirin 81 mg daily  and Zocor for secondary stroke prevention -check labs today for Depakote monitoring - valporic acid level, LFTs and ammonia level -Advised to increase Topamax as previously recommended by Dr. Leonie Man -Topamax 100 mg twice a day -Advised to continue current dose of Depakote 500 mg a.m. and 500 mg p.m. -we will possibly increase this depending on  lab results -F/u with PCP regarding your HLD and HTN management Continue to follow with-cardiology -Okay to undergo colonoscopy in approximately 1 month and as long as seizures are controlled with medication compliance -continue to monitor BP at home -Maintain strict control of hypertension with blood pressure goal below 130/90, diabetes with hemoglobin A1c goal below 6.5% and cholesterol with LDL  cholesterol (bad cholesterol) goal below 70 mg/dL. I also advised the patient to eat a healthy diet with plenty of whole grains, cereals, fruits and vegetables, exercise regularly and maintain ideal body weight.  Follow up in 1 year or call earlier if needed  Greater than 50% of time during this 25 minute visit was spent on counseling,explanation of diagnosis of history of stroke and seizures, reviewing risk factor management of HTN, HLD and seizures, planning of further management, discussion with patient and family and coordination of care  Venancio Poisson, AGNP-BC  [facility] [address] [place], [place] [address]  Phone [phone] Fax [phone] Note: This document was prepared with digital dictation and possible smart phrase technology. Any transcriptional errors that result from this process are unintentional.

## 2018-04-27 LAB — COMPREHENSIVE METABOLIC PANEL
A/G RATIO: 1.6 (ref 1.2–2.2)
ALBUMIN: 4.1 g/dL (ref 3.5–5.5)
ALK PHOS: 111 IU/L (ref 39–117)
ALT: 13 IU/L (ref 0–32)
AST: 11 IU/L (ref 0–40)
BUN / CREAT RATIO: 14 (ref 9–23)
BUN: 9 mg/dL (ref 6–24)
CO2: 19 mmol/L — ABNORMAL LOW (ref 20–29)
Calcium: 9.1 mg/dL (ref 8.7–10.2)
Chloride: 112 mmol/L — ABNORMAL HIGH (ref 96–106)
Creatinine, Ser: 0.63 mg/dL (ref 0.57–1.00)
GFR calc non Af Amer: 98 mL/min/{1.73_m2} (ref >59)
GFR, EST AFRICAN AMERICAN: 114 mL/min/{1.73_m2} (ref >59)
GLUCOSE: 84 mg/dL (ref 65–99)
Globulin, Total: 2.6 g/dL (ref 1.5–4.5)
POTASSIUM: 4.4 mmol/L (ref 3.5–5.2)
Sodium: 145 mmol/L — ABNORMAL HIGH (ref 134–144)
TOTAL PROTEIN: 6.7 g/dL (ref 6.0–8.5)

## 2018-04-27 LAB — CBC
Hematocrit: 39.9 % (ref 34.0–46.6)
Hemoglobin: 13.3 g/dL (ref 11.1–15.9)
MCH: 32.8 pg (ref 26.6–33.0)
MCHC: 33.3 g/dL (ref 31.5–35.7)
MCV: 98 fL — AB (ref 79–97)
PLATELETS: 198 10*3/uL (ref 150–450)
RBC: 4.06 x10E6/uL (ref 3.77–5.28)
RDW: 13.4 % (ref 12.3–15.4)
WBC: 7.9 10*3/uL (ref 3.4–10.8)

## 2018-04-27 LAB — AMMONIA: AMMONIA: 38 ug/dL (ref 19–87)

## 2018-04-27 LAB — VALPROIC ACID LEVEL: Valproic Acid Lvl: <4 ug/mL — ABNORMAL LOW (ref 50–100)

## 2018-04-27 LAB — HEPATIC FUNCTION PANEL: BILIRUBIN, DIRECT: 0.07 mg/dL (ref 0.00–0.40)

## 2018-04-29 ENCOUNTER — Telehealth: Payer: Self-pay | Admitting: *Deleted

## 2018-04-29 ENCOUNTER — Telehealth: Payer: Self-pay | Admitting: Adult Health

## 2018-04-29 DIAGNOSIS — Z79899 Other long term (current) drug therapy: Secondary | ICD-10-CM

## 2018-04-29 NOTE — Telephone Encounter (Signed)
Called and spoke with pt. Relayed results per Shanda Bumps note. Pt verbalized understanding and will come 05/27/18 for repeat depakote level. I placed her on lab schedule. She wrote down appt on her calender as well.

## 2018-04-29 NOTE — Telephone Encounter (Signed)
Order placed for repeat valproic acid level in 4 weeks.

## 2018-05-27 ENCOUNTER — Other Ambulatory Visit: Payer: Self-pay | Admitting: Adult Health

## 2018-05-27 ENCOUNTER — Other Ambulatory Visit: Payer: Self-pay

## 2018-05-27 ENCOUNTER — Other Ambulatory Visit (INDEPENDENT_AMBULATORY_CARE_PROVIDER_SITE_OTHER): Payer: Self-pay

## 2018-05-27 DIAGNOSIS — Z79899 Other long term (current) drug therapy: Secondary | ICD-10-CM

## 2018-05-27 DIAGNOSIS — R569 Unspecified convulsions: Secondary | ICD-10-CM

## 2018-05-27 DIAGNOSIS — Z0289 Encounter for other administrative examinations: Secondary | ICD-10-CM

## 2018-05-28 LAB — VALPROIC ACID LEVEL: Valproic Acid Lvl: 90 ug/mL (ref 50–100)

## 2018-06-14 DIAGNOSIS — Z1231 Encounter for screening mammogram for malignant neoplasm of breast: Secondary | ICD-10-CM | POA: Diagnosis not present

## 2018-07-25 DIAGNOSIS — Z23 Encounter for immunization: Secondary | ICD-10-CM | POA: Diagnosis not present

## 2018-08-21 DIAGNOSIS — I2510 Atherosclerotic heart disease of native coronary artery without angina pectoris: Secondary | ICD-10-CM | POA: Diagnosis not present

## 2018-08-21 DIAGNOSIS — I2584 Coronary atherosclerosis due to calcified coronary lesion: Secondary | ICD-10-CM | POA: Diagnosis not present

## 2018-08-21 DIAGNOSIS — R0789 Other chest pain: Secondary | ICD-10-CM | POA: Diagnosis not present

## 2018-08-21 DIAGNOSIS — I10 Essential (primary) hypertension: Secondary | ICD-10-CM | POA: Diagnosis not present

## 2018-09-24 DIAGNOSIS — M549 Dorsalgia, unspecified: Secondary | ICD-10-CM | POA: Diagnosis not present

## 2018-09-24 DIAGNOSIS — L4050 Arthropathic psoriasis, unspecified: Secondary | ICD-10-CM | POA: Diagnosis not present

## 2018-09-24 DIAGNOSIS — R7989 Other specified abnormal findings of blood chemistry: Secondary | ICD-10-CM | POA: Diagnosis not present

## 2018-09-24 DIAGNOSIS — M0609 Rheumatoid arthritis without rheumatoid factor, multiple sites: Secondary | ICD-10-CM | POA: Diagnosis not present

## 2018-09-24 DIAGNOSIS — Z79899 Other long term (current) drug therapy: Secondary | ICD-10-CM | POA: Diagnosis not present

## 2018-09-24 DIAGNOSIS — R21 Rash and other nonspecific skin eruption: Secondary | ICD-10-CM | POA: Diagnosis not present

## 2018-09-24 DIAGNOSIS — M542 Cervicalgia: Secondary | ICD-10-CM | POA: Diagnosis not present

## 2018-09-24 DIAGNOSIS — M797 Fibromyalgia: Secondary | ICD-10-CM | POA: Diagnosis not present

## 2018-11-28 ENCOUNTER — Ambulatory Visit (INDEPENDENT_AMBULATORY_CARE_PROVIDER_SITE_OTHER): Payer: Medicare Other | Admitting: Otolaryngology

## 2018-11-28 DIAGNOSIS — H6983 Other specified disorders of Eustachian tube, bilateral: Secondary | ICD-10-CM | POA: Diagnosis not present

## 2018-11-28 DIAGNOSIS — H903 Sensorineural hearing loss, bilateral: Secondary | ICD-10-CM | POA: Diagnosis not present

## 2018-12-02 ENCOUNTER — Other Ambulatory Visit: Payer: Self-pay

## 2018-12-02 DIAGNOSIS — G40209 Localization-related (focal) (partial) symptomatic epilepsy and epileptic syndromes with complex partial seizures, not intractable, without status epilepticus: Secondary | ICD-10-CM

## 2018-12-02 MED ORDER — CITALOPRAM HYDROBROMIDE 20 MG PO TABS: 40.0000 mg | ORAL_TABLET | Freq: Every day | ORAL | 1 refills | Status: DC

## 2018-12-02 MED ORDER — PRAVASTATIN SODIUM 20 MG PO TABS: 20.0000 mg | ORAL_TABLET | Freq: Every evening | ORAL | 1 refills | Status: DC

## 2018-12-02 MED ORDER — AMLODIPINE BESYLATE 5 MG PO TABS: 5.0000 mg | ORAL_TABLET | Freq: Every day | ORAL | 1 refills | Status: DC

## 2018-12-02 MED ORDER — TOPIRAMATE 100 MG PO TABS: 100.0000 mg | ORAL_TABLET | Freq: Two times a day (BID) | ORAL | 1 refills | Status: DC

## 2018-12-02 MED ORDER — DIVALPROEX SODIUM ER 500 MG PO TB24: 1000.0000 mg | ORAL_TABLET | Freq: Every day | ORAL | 1 refills | Status: DC

## 2018-12-04 ENCOUNTER — Other Ambulatory Visit: Payer: Self-pay

## 2018-12-04 DIAGNOSIS — I10 Essential (primary) hypertension: Secondary | ICD-10-CM

## 2018-12-04 DIAGNOSIS — E7800 Pure hypercholesterolemia, unspecified: Secondary | ICD-10-CM

## 2018-12-04 MED ORDER — PRAVASTATIN SODIUM 20 MG PO TABS: 20.0000 mg | ORAL_TABLET | Freq: Every evening | ORAL | 1 refills | Status: DC

## 2018-12-04 MED ORDER — AMLODIPINE BESYLATE 5 MG PO TABS: 5.0000 mg | ORAL_TABLET | Freq: Every day | ORAL | 1 refills | Status: DC

## 2018-12-24 ENCOUNTER — Other Ambulatory Visit: Payer: Self-pay

## 2018-12-24 DIAGNOSIS — E7800 Pure hypercholesterolemia, unspecified: Secondary | ICD-10-CM

## 2018-12-24 DIAGNOSIS — I10 Essential (primary) hypertension: Secondary | ICD-10-CM

## 2018-12-24 MED ORDER — PRAVASTATIN SODIUM 20 MG PO TABS: 20.0000 mg | ORAL_TABLET | Freq: Every evening | ORAL | 2 refills | Status: DC

## 2018-12-24 MED ORDER — AMLODIPINE BESYLATE 5 MG PO TABS: 5.0000 mg | ORAL_TABLET | Freq: Every day | ORAL | 2 refills | Status: DC

## 2019-04-17 ENCOUNTER — Other Ambulatory Visit: Payer: Self-pay | Admitting: Adult Health

## 2019-04-23 ENCOUNTER — Telehealth: Payer: Self-pay | Admitting: Adult Health

## 2019-04-23 ENCOUNTER — Encounter: Payer: Self-pay | Admitting: Adult Health

## 2019-04-23 NOTE — Telephone Encounter (Signed)
I called patient and spoke with her about rescheduling her 04/28/19 appt due to [facility] NP being out of office. Patient stated that she is unable to reschedule at this time, did not give reason. I advised patient that I would cancel this appointment for now but will send a cancellation letter as a reminder for patient to call back when able. Patient verbalized understanding.

## 2019-04-28 ENCOUNTER — Ambulatory Visit: Payer: BC Managed Care – PPO | Admitting: Adult Health

## 2019-06-05 ENCOUNTER — Other Ambulatory Visit: Payer: Self-pay | Admitting: Adult Health

## 2019-06-06 ENCOUNTER — Other Ambulatory Visit: Payer: Self-pay | Admitting: Adult Health

## 2019-06-12 ENCOUNTER — Other Ambulatory Visit: Payer: Self-pay | Admitting: Adult Health

## 2019-06-12 DIAGNOSIS — G40209 Localization-related (focal) (partial) symptomatic epilepsy and epileptic syndromes with complex partial seizures, not intractable, without status epilepticus: Secondary | ICD-10-CM

## 2019-07-28 ENCOUNTER — Other Ambulatory Visit: Payer: Self-pay | Admitting: Adult Health

## 2019-07-31 ENCOUNTER — Other Ambulatory Visit: Payer: Self-pay | Admitting: Adult Health

## 2019-07-31 ENCOUNTER — Telehealth: Payer: Self-pay

## 2019-07-31 DIAGNOSIS — G40209 Localization-related (focal) (partial) symptomatic epilepsy and epileptic syndromes with complex partial seizures, not intractable, without status epilepticus: Secondary | ICD-10-CM

## 2019-07-31 NOTE — Telephone Encounter (Signed)
IF patient calls back please schedule with Janett Billow NP at next available.PT was last seen 04/2018 in our office. Pt was only given a 30 day refill of depakote for her seizure. If Janett Billow NP has no availability ask Amy NP if she can see pt just one time.  VM was left for patient to call back to schedule a follow up appt. Pt was last seen 04/2018 and a refill was given 30 days for depakote for seizure.

## 2019-08-18 ENCOUNTER — Encounter: Payer: Self-pay | Admitting: Cardiology

## 2019-08-18 ENCOUNTER — Ambulatory Visit: Payer: Medicare Other | Admitting: Cardiology

## 2019-08-18 ENCOUNTER — Other Ambulatory Visit: Payer: Self-pay

## 2019-08-18 VITALS — BP 116/68 | HR 62 | Temp 97.3°F | Ht 64.0 in | Wt 175.0 lb

## 2019-08-18 DIAGNOSIS — M05742 Rheumatoid arthritis with rheumatoid factor of left hand without organ or systems involvement: Secondary | ICD-10-CM

## 2019-08-18 DIAGNOSIS — R0600 Dyspnea, unspecified: Secondary | ICD-10-CM

## 2019-08-18 DIAGNOSIS — Q2112 Patent foramen ovale: Secondary | ICD-10-CM

## 2019-08-18 DIAGNOSIS — Q211 Atrial septal defect: Secondary | ICD-10-CM | POA: Diagnosis not present

## 2019-08-18 DIAGNOSIS — R0609 Other forms of dyspnea: Secondary | ICD-10-CM

## 2019-08-18 DIAGNOSIS — I2510 Atherosclerotic heart disease of native coronary artery without angina pectoris: Secondary | ICD-10-CM

## 2019-08-18 DIAGNOSIS — M05741 Rheumatoid arthritis with rheumatoid factor of right hand without organ or systems involvement: Secondary | ICD-10-CM

## 2019-08-18 DIAGNOSIS — E782 Mixed hyperlipidemia: Secondary | ICD-10-CM

## 2019-08-18 NOTE — Progress Notes (Signed)
Primary Physician/Referring:  Barbie Banner, MD  Patient ID: [name], female    DOB: 10/24/1960, 58 y.o.   MRN: [id number]  Chief Complaint  Patient presents with  . Hypertension  . Coronary Artery Disease  . Follow-up    1 year   HPI:    [name]  is a 58 y.o. Caucasian female with history of embolic stroke on 03/26/2013, randomized to medical arm in the "Reduce" secondary prevention of stroke trial with PFO. No recurrence of stroke since. Echocardiogram and stress test in Nov 2018 which was low to intermediate risk with abnormal EKG response and probably septal ischemia, but normal LVEF. Coronary CTA in Jan 2019 revealed mild coronary atherosclerosis.   Her past medical history is significant for psoriatic arthritis, sleep apnea on CPAP, rheumatoid arthritis, history of nephrolithiasis, history of cerebrovascular AV malformations felt to be small and stroke felt to be cardioembolic in June 2014, COPD and has 40+ year history of smoking cigarettes and follows Dr. Jetty Duhamel, COPD felt to be stable.  From cardiac standpoint she is doing well, had chronic dyspnea and gradually worsening. She continues to have occasional chest pressure especially when she lays on left side. Has s/l NTG that she prefers to carry with her. From rheumatologic standpoint, she is now established with Dr. Zenovia Jordan.  States that since being on new medication, arthritis in her hands symptoms is improved.  Past Medical History:  Diagnosis Date  . Allergic rhinitis    uses Flonase daily as needed and takes CLaritin daily  . Anxiety    takes Xanax daily as needed  . Asthma    Albuterol inhaler prn;SYmbicort daily  . Cerebral vascular malformation    sees dr Lovell Sheehan for monitoring as needed, sees dr lewitt for headaches every 4 months  . COPD (chronic obstructive pulmonary disease) (HCC)   . Depression    takes Citalopram daily  . Diverticulitis at age 44  . Eczema   . Fibromyalgia   .  GERD (gastroesophageal reflux disease)    takes Protonix daily  . Hard of hearing   . History of bronchitis as a child   . History of kidney stones   . History of migraine    last one 10+yrs ago  . History of staph infection 5yrs ago  . Hyperlipidemia    takes Pravastatin daily  . IBS (irritable bowel syndrome)    mixed  . Insomnia   . Joint pain   . Joint swelling   . Lactose intolerance   . Nausea    takes Zofran daily as needed  . PFO (patent foramen ovale)   . Plaque psoriasis   . Pneumonia 69yrs ago   hx of  . PONV (postoperative nausea and vomiting)   . Postoperative anemia due to acute blood loss 02/08/2016  . Primary localized osteoarthritis of left knee   . Primary localized osteoarthritis of right knee 11/03/2015  . Rheumatoid arthritis(714.0) 2010   oa and ra;Rhemicade IV every 6wks and Metotrexate weekly  . Seizures (HCC) 57months ago 03/21/14   takes Depakote daily  . Shortness of breath    with exertion  . Sleep apnea    study done >76yrs ago;uses CPAP nightly  . Spinal headache    patient states that she thinks she had a spinal headache a long time ago  . Stress incontinence   . Stroke (HCC) 03/26/2013   left sided weakness  . Thyroid cyst   . Ventricular  septal defect    2014 TEE lists large right to left shunt ASD/PFO (NO VSD mentioned)   Past Surgical History:  Procedure Laterality Date  . APPENDECTOMY  1981  . CARDIAC CATHETERIZATION  2004  . CHOLECYSTECTOMY  1981  . COLONOSCOPY    . ESOPHAGOGASTRODUODENOSCOPY    . FOOT SURGERY  1999   right ankle  . HERNIA REPAIR  2006   x2  . INCISIONAL HERNIA REPAIR  05/20/2012   Procedure: HERNIA REPAIR INCISIONAL;  Surgeon: Clovis Puhomas A. Cornett, MD;  Location: [facility];  Service: General;  Laterality: N/A;  . INCONTINENCE SURGERY  2010   sling done   . KIDNEY STONE SURGERY  2001  . KNEE ARTHROSCOPY  1992   left  . left foot plating and scarping for arthritis  2011  . right ear tube insertion  2011  . TEE  WITHOUT CARDIOVERSION N/A 05/15/2013   Procedure: TRANSESOPHAGEAL ECHOCARDIOGRAM (TEE);  Surgeon: Pamella PertJagadeesh R Ligia Duguay, MD;  [facility];  Service: Cardiovascular;  Laterality: N/A;  . thryoid biopsy    . TONSILLECTOMY AND ADENOIDECTOMY  04/01/2014  . TONSILLECTOMY AND ADENOIDECTOMY Bilateral 04/01/2014   Procedure: BILATERAL TONSILLECTOMY AND ADENOIDECTOMY;  Surgeon: Darletta MollSui W Teoh, MD;  [facility];  Service: ENT;  Laterality: Bilateral;  . TOTAL ABDOMINAL HYSTERECTOMY  2001  . TOTAL KNEE ARTHROPLASTY Right 11/03/2015  . TOTAL KNEE ARTHROPLASTY Right 11/03/2015   Procedure: RIGHT TOTAL KNEE ARTHROPLASTY/RIGHT;  Surgeon: Salvatore Marvelobert Wainer, MD;  [facility];  Service: Orthopedics;  Laterality: Right;  . TOTAL KNEE ARTHROPLASTY Left 02/07/2016   Procedure: TOTAL KNEE ARTHROPLASTY;  Surgeon: Salvatore Marvelobert Wainer, MD;  [facility];  Service: Orthopedics;  Laterality: Left;  . TUBAL LIGATION  1990   Social History   Socioeconomic History  . Marital status: Married    Spouse name: Onalee HuaDavid  . Number of children: 3  . Years of education: College  . Highest education level: Not on file  Occupational History  . Occupation: EconomistCafe manager    Comment: [employer]     Employer: [employer]  Social Needs  . Financial resource strain: Not on file  . Food insecurity    Worry: Not on file    Inability: Not on file  . Transportation needs    Medical: Not on file    Non-medical: Not on file  Tobacco Use  . Smoking status: Former Smoker    Packs/day: 2.00    Years: 30.00    Pack years: 60.00    Types: Cigarettes    Quit date: 10/20/1999    Years since quitting: 19.8  . Smokeless tobacco: Never Used  . Tobacco comment: quit smoking in 2002  Substance and Sexual Activity  . Alcohol use: Yes    Comment: occ  . Drug use: No  . Sexual activity: Yes  Lifestyle  . Physical activity    Days per week: Not on file    Minutes per session: Not on file  . Stress: Not on file   Relationships  . Social Musicianconnections    Talks on phone: Not on file    Gets together: Not on file    Attends religious service: Not on file    Active member of club or organization: Not on file    Attends meetings of clubs or organizations: Not on file    Relationship status: Not on file  . Intimate partner violence    Fear of current or ex partner: Not on file  Emotionally abused: Not on file    Physically abused: Not on file    Forced sexual activity: Not on file  Other Topics Concern  . Not on file  Social History Narrative   Patient is married with 3 children.   Patient is right handed.   Patient has college education.   Caffeine Use: 1.5 cup of coffee in a.m   ROS  Review of Systems  Constitution: Positive for malaise/fatigue (chronic). Negative for chills, decreased appetite and weight gain.  Cardiovascular: Negative for dyspnea on exertion, leg swelling and syncope.  Respiratory: Positive for shortness of breath and wheezing.   Endocrine: Negative for cold intolerance.  Hematologic/Lymphatic: Does not bruise/bleed easily.  Musculoskeletal: Positive for arthritis. Negative for joint swelling.  Gastrointestinal: Negative for abdominal pain, anorexia, change in bowel habit, hematochezia and melena.  Neurological: Positive for disturbances in coordination and paresthesias. Negative for headaches and light-headedness.  Psychiatric/Behavioral: Negative for depression and substance abuse.  All other systems reviewed and are negative.  Objective   Vitals with BMI 08/18/2019 04/26/2018 11/13/2017  Height 5\' 4"  5' 4.5" -  Weight 175 lbs 164 lbs 3 oz -  BMI 40.34 74.25 -  Systolic 956 387 564  Diastolic 68 75 80  Pulse 62 41 57    Blood pressure 116/68, pulse 62, temperature (!) 97.3 F (36.3 C), height 5\' 4"  (1.626 m), weight 175 lb (79.4 kg), SpO2 97 %. Body mass index is 30.04 kg/m.   Physical Exam  Constitutional: She appears well-developed and well-nourished.  HENT:   Head: Atraumatic.  Eyes: Conjunctivae are normal.  Neck: Neck supple. No JVD present. No thyromegaly present.  Cardiovascular: Regular rhythm, normal heart sounds and intact distal pulses. Bradycardia present. Exam reveals no gallop.  No murmur heard. No leg edema, no JVD.  Pulmonary/Chest: Effort normal and breath sounds normal.  Abdominal: Soft. Bowel sounds are normal.  Musculoskeletal: Normal range of motion.  Neurological: She is alert.  Skin: Skin is warm and dry.  Psychiatric: She has a normal mood and affect.   Laboratory examination:   Comprehensive Metabolic PanelResulted: 12/16/2949 6:02 PM [facility] Component Name Value Ref Range  Sodium 140 135 - 146 MMOL/L  Potassium 4.0 3.5 - 5.3 MMOL/L  Chloride 111 (H) 98 - 110 MMOL/L  CO2 22 (L) 23 - 30 MMOL/L  BUN 11 8 - 24 MG/DL  Glucose 97 70 - 99 MG/DL  Creatinine 0.54 0.5 - 1.5 MG/DL  Calcium 9.1 8.5 - 10.5 MG/DL  Total Protein 6.8 6 - 8.3 G/DL  Albumin  4.2 3.5 - 5 G/DL  Total Bilirubin 0.4 0.1 - 1.2 MG/DL  Alkaline Phosphatase 65 25 - 125 IU/L or U/L  AST (SGOT) 13 5 - 40 IU/L or U/L  ALT (SGPT) 12 5 - 50 IU/L or U/L  Anion Gap 7 4 - 14 MMOL/L  Est. GFR Non-African American >=90    Lipid ProfileResulted: 05/19/2019 6:02 PM [facility] Component Name Value Ref Range  LDL Direct 116 <130 mg/dL  Total Cholesterol 176 25 - 199 MG/DL  Triglycerides 178 (H) 10 - 150 MG/DL  HDL Cholesterol 45 35 - 135 MG/DL  Total Chol / HDL Cholesterol 3.9 <4.5   Non-HDL Cholesterol 131  Comment:  TARGET: <(LDL-C TARGET + 30)MG/DL    HB 14.8/HCT 43.9, mild macrocytosis present.  Platelets 170.  WBC 6.0.  HEMOGLOBIN A1C Lab Results  Component Value Date   HGBA1C 5.6 03/27/2013   MPG  114 03/27/2013   TSH No results for input(s): TSH in the last 8760 hours. Medications and allergies   Allergies  Allergen Reactions  . Aquacel [Carboxymethylcellulose] Other (See Comments)     Blisters   . Nickel   . Sulfonamide Derivatives Anaphylaxis, Hives and Swelling    REACTION: swelling, tongue and hives  . Aspirin Nausea And Vomiting    REACTION: severe gi upset.  Pt states she can tolerate 81 mg asa only.   . Ciprofloxacin Itching and Swelling    REACTION: angioedema, urticaria  . Cosyntropin Hives and Swelling    REACTION: swelling, hives  . Adhesive [Tape] Other (See Comments)    Blisters skin  . Latex Other (See Comments)    other  . Amoxicillin Nausea And Vomiting    REACTION: GI upset  . Guaifenesin Rash    REACTION: head rash  . Moxifloxacin Other (See Comments)    gi upset  . Nsaids Other (See Comments)    Gi upset     Prior to Admission medications   Medication Sig Start Date End Date Taking? Authorizing Provider  ALPRAZolam Prudy Feeler(XANAX) 0.5 MG tablet Take 0.5 mg by mouth 2 (two) times daily as needed for anxiety.     [provider]  amLODipine (NORVASC) 5 MG tablet Take 1 tablet (5 mg total) by mouth daily. 12/24/18   Yates DecampGanji, Louanna Vanliew, MD  aspirin EC 81 MG tablet Take 81 mg by mouth daily.    [provider]  citalopram (CELEXA) 20 MG tablet TAKE 2 TABLETS BY MOUTH  DAILY 04/17/19   Ihor AustinMcCue, Jessica, NP  clobetasol ointment (TEMOVATE) 0.05 % Apply 1 application topically 2 (two) times daily as needed (inflammation on feet.).  04/25/12   [provider]  divalproex (DEPAKOTE ER) 500 MG 24 hr tablet TAKE 2 TABLETS BY MOUTH  DAILY 07/31/19   Ihor AustinMcCue, Jessica, NP  fluticasone (FLONASE) 50 MCG/ACT nasal spray Place 2 sprays into both nostrils daily as needed for allergies.  05/29/13   [provider]  Lifitegrast Benay Spice(XIIDRA) 5 % SOLN Apply 1 drop to eye 2 (two) times daily.    [provider]  loratadine (CLARITIN) 10 MG tablet Take 10 mg by mouth daily as needed.     [provider]  Multiple Vitamin (MULTIVITAMIN) capsule Take 1 capsule by mouth daily.     [provider]  OTEZLA 30 MG TABS Take 1 tablet by mouth  2 (two) times daily. 04/25/18   [provider]  pravastatin (PRAVACHOL) 20 MG tablet Take 1 tablet (20 mg total) by mouth every evening. 12/24/18   Yates DecampGanji, Eliz Nigg, MD  simvastatin (ZOCOR) 20 MG tablet Take 20 mg by mouth daily.    [provider]  topiramate (TOPAMAX) 100 MG tablet TAKE 1 TABLET BY MOUTH TWO  TIMES DAILY 06/09/19   Ihor AustinMcCue, Jessica, NP  VOLTAREN 1 % GEL  11/16/16   [provider]     Current Outpatient Medications  Medication Instructions  . ALPRAZolam (XANAX) 0.5 mg, 2 times daily PRN  . amLODipine (NORVASC) 5 mg, Oral, Daily  . aspirin EC 81 mg, Oral, Daily  . citalopram (CELEXA) 20 MG tablet TAKE 2 TABLETS BY MOUTH  DAILY  . clobetasol ointment (TEMOVATE) 0.05 % 1 application, 2 times daily PRN  . divalproex (DEPAKOTE ER) 500 MG 24 hr tablet TAKE 2 TABLETS BY MOUTH  DAILY  . fluticasone (FLONASE) 50 MCG/ACT nasal spray 2 sprays, Daily PRN  . leflunomide (ARAVA) 20 mg, Oral,  Daily  . Lifitegrast (XIIDRA) 5 % SOLN 1 drop, Ophthalmic, 2 times daily  . loratadine (CLARITIN) 10 mg, Oral, Daily PRN  . pravastatin (PRAVACHOL) 20 mg, Oral, Every evening  . topiramate (TOPAMAX) 100 MG tablet TAKE 1 TABLET BY MOUTH TWO  TIMES DAILY   Radiology:  No results found.  Cardiac Studies:   Exercise myoview stress 09/03/2017: 1. The resting electrocardiogram demonstrated normal sinus rhythm, normal resting conduction and no resting arrhythmias. With peak exercise there was 1 mm upsloping ST depression which returned to baseline at 2 minutes into recovery, equivocal for ischemia. The patient performed treadmill exercise using a Bruce protocol, completing 6:30 minutes. The patient completed an estimated workload of 7.46 METS, achieved 88% of MPHR (THR 85% for age). Stress symptoms included dyspnea, dizziness, headache, chest pressure. The stress test was terminated because of fatigue. Hypertensive BP response. Resting blood pressure was 142/88 mmHg and peak blood  pressure was 212/106 mmHg. 2. There is a small area of mild ischemia in the basal inferoseptal and mid inferoseptal myocardial wall(s). The left ventricular ejection fraction was calculated or visually estimated to be 63%. This is a low risk study.  Echocardiogram [08/27/2017]: Left ventricle cavity is normal in size. Normal global wall motion. LVEF is 55-60%. Normal diastolic filling pattern. Documented PFO on previous TEE. Interatrial septum not adequately visualized on this study. No significant valvular abnormality. No evidence of pulmonary hypertension.   Coronary CTA 11/13/2017: 1. Coronary calcium score of 19. This was 21 percentile for age and sex matched control. 2. Normal coronary origin with right dominance. 3. Minimal plaque in the left main, proximal and mid LAD. Aggressive risk factor modification is recommended. 4.  PFO is present. 5. Lungs clear.   Assessment     ICD-10-CM   1. Coronary artery calcification seen on CAT scan  I25.10 EKG 12-Lead  2. PFO (patent foramen ovale)  Q21.1   3. Dyspnea on exertion  R06.00   4. Mixed hyperlipidemia  E78.2   5. Rheumatoid arthritis involving both hands with positive rheumatoid factor (HCC)  M05.741    M05.742     EKG 08/18/2019: Sinus bradycardia at rate of 53 bpm, normal axis, incomplete right bundle branch block.  Nonspecific T abnormality. No significant change from  EKG 08/21/2018: Marked sinus bradycardia at rate of 56 bpm.   Recommendations:   Patient is here on annual visit and follow-up of PFO, hypertension, hyperlipidemia and chronic dyspnea.  She has history of stroke that occurred on 03/26/2013 without any recurrence, found to have a moderate large-size PFO but was randomized in "reduce" secondary prevention trial.  She has not had any recurrence of TIA like symptoms.  I reviewed her labs, labs are fairly stable except elevated triglycerides, we have discussed regarding reducing carbohydrate intake.  They should improve  her triglycerides.  Previously triglycerides were well-controlled.  Blood pressure is well controlled, otherwise on appropriate medical therapy, I'll see her back on an annual basis.  Yates Decamp, MD, [facility] 08/18/2019, 1:17 PM [facility] [place] Pager: [phone] Office: [phone] If no answer Cell [phone]

## 2019-08-22 ENCOUNTER — Other Ambulatory Visit: Payer: Self-pay | Admitting: Adult Health

## 2019-08-26 NOTE — Telephone Encounter (Signed)
Pt was last seen 04/2018 and needs an appt to continue refills.

## 2019-08-27 NOTE — Telephone Encounter (Signed)
Patient sent mychart message needing to schedule an appt with JEssica NP.

## 2019-08-27 NOTE — Telephone Encounter (Signed)
If patient calls back please schedule her for the earliest available appt. Pt was last seen 04/2018 and needs to be seen this year to continue all refills from our office.   Vm was left for pt discussing the above about appt needed to continue refills on topamax, depakote ,and celexa.

## 2019-10-10 ENCOUNTER — Other Ambulatory Visit: Payer: Self-pay | Admitting: Cardiology

## 2019-10-10 DIAGNOSIS — I10 Essential (primary) hypertension: Secondary | ICD-10-CM

## 2019-11-18 ENCOUNTER — Other Ambulatory Visit: Payer: Self-pay | Admitting: Gastroenterology

## 2019-12-12 ENCOUNTER — Other Ambulatory Visit (HOSPITAL_COMMUNITY)
Admission: RE | Admit: 2019-12-12 | Discharge: 2019-12-12 | Disposition: A | Discharge status: Home / Self Care | Payer: Medicare Other | Source: Ambulatory Visit | Attending: Gastroenterology | Admitting: Gastroenterology

## 2019-12-12 DIAGNOSIS — Z01812 Encounter for preprocedural laboratory examination: Secondary | ICD-10-CM | POA: Insufficient documentation

## 2019-12-12 DIAGNOSIS — Z20822 Contact with and (suspected) exposure to covid-19: Secondary | ICD-10-CM | POA: Diagnosis not present

## 2019-12-12 LAB — SARS CORONAVIRUS 2 (TAT 6-24 HRS): SARS Coronavirus 2: NEGATIVE

## 2019-12-15 NOTE — Anesthesia Preprocedure Evaluation (Addendum)
Anesthesia Evaluation  Patient identified by MRN, date of birth, ID band Patient awake    Reviewed: Allergy & Precautions, Patient's Chart, lab work & pertinent test results  History of Anesthesia Complications (+) PONV and history of anesthetic complications  Airway Mallampati: II  TM Distance: >3 FB Neck ROM: Full    Dental no notable dental hx. (+) Teeth Intact, Dental Advisory Given   Pulmonary sleep apnea and Continuous Positive Airway Pressure Ventilation , COPD,  COPD inhaler, former smoker,    Pulmonary exam normal breath sounds clear to auscultation       Cardiovascular hypertension, Pt. on medications Normal cardiovascular exam Rhythm:Regular Rate:Normal     Neuro/Psych  Headaches, Seizures -,  Depression CVA, Residual Symptoms    GI/Hepatic Neg liver ROS, GERD  ,  Endo/Other    Renal/GU negative Renal ROS     Musculoskeletal   Abdominal   Peds  Hematology   Anesthesia Other Findings   Reproductive/Obstetrics                            Anesthesia Physical Anesthesia Plan  ASA: III  Anesthesia Plan: MAC   Post-op Pain Management:    Induction: Intravenous  PONV Risk Score and Plan: Treatment may vary due to age or medical condition  Airway Management Planned: Natural Airway  Additional Equipment: None  Intra-op Plan:   Post-operative Plan:   Informed Consent: I have reviewed the patients History and Physical, chart, labs and discussed the procedure including the risks, benefits and alternatives for the proposed anesthesia with the patient or authorized representative who has indicated his/her understanding and acceptance.     Dental advisory given  Plan Discussed with: CRNA  Anesthesia Plan Comments: (Colonoscopy for screening Under mac.  See Allergy list : Latex and 11 others)      Anesthesia Quick Evaluation

## 2019-12-16 ENCOUNTER — Ambulatory Visit (HOSPITAL_COMMUNITY)
Admission: RE | Admit: 2019-12-16 | Discharge: 2019-12-16 | Disposition: A | Discharge status: Home / Self Care | Payer: Medicare Other | Attending: Gastroenterology | Admitting: Gastroenterology

## 2019-12-16 ENCOUNTER — Other Ambulatory Visit: Payer: Self-pay

## 2019-12-16 ENCOUNTER — Encounter (HOSPITAL_COMMUNITY)
Admission: RE | Disposition: A | Discharge status: Home / Self Care | Payer: Self-pay | Source: Home / Self Care | Attending: Gastroenterology

## 2019-12-16 ENCOUNTER — Ambulatory Visit (HOSPITAL_COMMUNITY): Payer: Medicare Other | Admitting: Anesthesiology

## 2019-12-16 ENCOUNTER — Encounter (HOSPITAL_COMMUNITY): Payer: Self-pay | Admitting: Gastroenterology

## 2019-12-16 DIAGNOSIS — Z888 Allergy status to other drugs, medicaments and biological substances status: Secondary | ICD-10-CM | POA: Insufficient documentation

## 2019-12-16 DIAGNOSIS — Z880 Allergy status to penicillin: Secondary | ICD-10-CM | POA: Insufficient documentation

## 2019-12-16 DIAGNOSIS — R159 Full incontinence of feces: Secondary | ICD-10-CM | POA: Diagnosis not present

## 2019-12-16 DIAGNOSIS — Z87891 Personal history of nicotine dependence: Secondary | ICD-10-CM | POA: Insufficient documentation

## 2019-12-16 DIAGNOSIS — Q211 Atrial septal defect: Secondary | ICD-10-CM | POA: Insufficient documentation

## 2019-12-16 DIAGNOSIS — G4730 Sleep apnea, unspecified: Secondary | ICD-10-CM | POA: Insufficient documentation

## 2019-12-16 DIAGNOSIS — K5730 Diverticulosis of large intestine without perforation or abscess without bleeding: Secondary | ICD-10-CM | POA: Insufficient documentation

## 2019-12-16 DIAGNOSIS — Z8619 Personal history of other infectious and parasitic diseases: Secondary | ICD-10-CM | POA: Insufficient documentation

## 2019-12-16 DIAGNOSIS — Z881 Allergy status to other antibiotic agents status: Secondary | ICD-10-CM | POA: Diagnosis not present

## 2019-12-16 DIAGNOSIS — F329 Major depressive disorder, single episode, unspecified: Secondary | ICD-10-CM | POA: Insufficient documentation

## 2019-12-16 DIAGNOSIS — K219 Gastro-esophageal reflux disease without esophagitis: Secondary | ICD-10-CM | POA: Insufficient documentation

## 2019-12-16 DIAGNOSIS — Z8261 Family history of arthritis: Secondary | ICD-10-CM | POA: Insufficient documentation

## 2019-12-16 DIAGNOSIS — F419 Anxiety disorder, unspecified: Secondary | ICD-10-CM | POA: Diagnosis not present

## 2019-12-16 DIAGNOSIS — Z8673 Personal history of transient ischemic attack (TIA), and cerebral infarction without residual deficits: Secondary | ICD-10-CM | POA: Diagnosis not present

## 2019-12-16 DIAGNOSIS — Z7982 Long term (current) use of aspirin: Secondary | ICD-10-CM | POA: Insufficient documentation

## 2019-12-16 DIAGNOSIS — R152 Fecal urgency: Secondary | ICD-10-CM | POA: Diagnosis not present

## 2019-12-16 DIAGNOSIS — Z79899 Other long term (current) drug therapy: Secondary | ICD-10-CM | POA: Insufficient documentation

## 2019-12-16 DIAGNOSIS — E785 Hyperlipidemia, unspecified: Secondary | ICD-10-CM | POA: Insufficient documentation

## 2019-12-16 DIAGNOSIS — R569 Unspecified convulsions: Secondary | ICD-10-CM | POA: Diagnosis not present

## 2019-12-16 DIAGNOSIS — J449 Chronic obstructive pulmonary disease, unspecified: Secondary | ICD-10-CM | POA: Insufficient documentation

## 2019-12-16 DIAGNOSIS — Z886 Allergy status to analgesic agent status: Secondary | ICD-10-CM | POA: Insufficient documentation

## 2019-12-16 DIAGNOSIS — Z882 Allergy status to sulfonamides status: Secondary | ICD-10-CM | POA: Diagnosis not present

## 2019-12-16 DIAGNOSIS — M069 Rheumatoid arthritis, unspecified: Secondary | ICD-10-CM | POA: Diagnosis not present

## 2019-12-16 DIAGNOSIS — J45909 Unspecified asthma, uncomplicated: Secondary | ICD-10-CM | POA: Diagnosis not present

## 2019-12-16 DIAGNOSIS — M797 Fibromyalgia: Secondary | ICD-10-CM | POA: Insufficient documentation

## 2019-12-16 DIAGNOSIS — Z8249 Family history of ischemic heart disease and other diseases of the circulatory system: Secondary | ICD-10-CM | POA: Insufficient documentation

## 2019-12-16 DIAGNOSIS — K582 Mixed irritable bowel syndrome: Secondary | ICD-10-CM | POA: Insufficient documentation

## 2019-12-16 DIAGNOSIS — R194 Change in bowel habit: Secondary | ICD-10-CM | POA: Insufficient documentation

## 2019-12-16 HISTORY — PX: BIOPSY: SHX5522

## 2019-12-16 HISTORY — PX: COLONOSCOPY WITH PROPOFOL: SHX5780

## 2019-12-16 SURGERY — COLONOSCOPY WITH PROPOFOL
Anesthesia: Monitor Anesthesia Care

## 2019-12-16 MED ORDER — DEXAMETHASONE SODIUM PHOSPHATE 10 MG/ML IJ SOLN
INTRAMUSCULAR | Status: DC | PRN
  Administered 2019-12-16: 8 mg via INTRAVENOUS

## 2019-12-16 MED ORDER — PROPOFOL 500 MG/50ML IV EMUL
INTRAVENOUS | Status: AC
  Filled 2019-12-16: qty 50

## 2019-12-16 MED ORDER — LIDOCAINE 2% (20 MG/ML) 5 ML SYRINGE
INTRAMUSCULAR | Status: DC | PRN
  Administered 2019-12-16: 80 mg via INTRAVENOUS

## 2019-12-16 MED ORDER — PROPOFOL 500 MG/50ML IV EMUL
INTRAVENOUS | Status: DC | PRN
  Administered 2019-12-16: 125 ug/kg/min via INTRAVENOUS

## 2019-12-16 MED ORDER — ONDANSETRON HCL 4 MG/2ML IJ SOLN
INTRAMUSCULAR | Status: DC | PRN
  Administered 2019-12-16: 4 mg via INTRAVENOUS

## 2019-12-16 MED ORDER — PROPOFOL 10 MG/ML IV BOLUS
INTRAVENOUS | Status: AC
  Filled 2019-12-16: qty 20

## 2019-12-16 MED ORDER — PROPOFOL 10 MG/ML IV BOLUS
INTRAVENOUS | Status: DC | PRN
  Administered 2019-12-16: 30 mg via INTRAVENOUS
  Administered 2019-12-16: 20 mg via INTRAVENOUS

## 2019-12-16 MED ORDER — LACTATED RINGERS IV SOLN
INTRAVENOUS | Status: DC
  Administered 2019-12-16: 1000 mL via INTRAVENOUS

## 2019-12-16 MED ORDER — SODIUM CHLORIDE 0.9 % IV SOLN: INTRAVENOUS | Status: DC

## 2019-12-16 SURGICAL SUPPLY — 22 items

## 2019-12-16 NOTE — Op Note (Signed)
[facility] Patient Name: [name] Procedure Date: 12/16/2019 MRN: [id number] Attending MD: Juanita Craver , MD Date of Birth: Mar 16, 1961 CSN: [id number] Age: 59 Admit Type: Outpatient Procedure:                Colonoscopy with random biopsies. Indications:              Change in bowel habits; CRC screening for                            colorectal malignant neoplasm. Providers:                Juanita Craver, MD, Cleda Daub, RN, Theodora Blow,                            Technician, [facility], CRNA Referring MD:             Aletha Halim PA-C Medicines:                Monitored Anesthesia Care Complications:            No immediate complications. Estimated Blood Loss:     Estimated blood loss was minimal. Procedure:                Pre-Anesthesia Assessment: - Prior to the                            procedure, a history and physical was performed,                            and patient medications and allergies were                            reviewed. The patient's tolerance of previous                            anesthesia was also reviewed. The risks and                            benefits of the procedure and the sedation options                            and risks were discussed with the patient. All                            questions were answered, and informed consent was                            obtained. Prior Anticoagulants: The patient has                            taken no previous anticoagulant or antiplatelet                            agents. ASA Grade Assessment: III - A patient with  severe systemic disease. After reviewing the risks                            and benefits, the patient was deemed in                            satisfactory condition to undergo the procedure.                            After obtaining informed consent, the colonoscope                            was passed under direct vision.  Throughout the                            procedure, the patient's blood pressure, pulse, and                            oxygen saturations were monitored continuously. The                            CF-HQ190L ([id number]) Olympus colonoscope was                            introduced through the anus and advanced to the the                            terminal ileum, with identification of the                            appendiceal orifice and IC valve. The patient                            tolerated the procedure well. The quality of the                            bowel preparation was good. The colonoscopy was                            extremely difficult due to the patient's agitation.                            The patient tolerated the procedure well. The                            quality of the bowel preparation was good. The                            terminal ileum, the ileocecal valve, the                            appendiceal orifice and the rectum were  photographed. Scope In: 7:34:06 AM Scope Out: 7:53:38 AM Scope Withdrawal Time: 0 hours 5 minutes 41 seconds  Total Procedure Duration: 0 hours 19 minutes 32 seconds  Findings:      A few small and large-mouthed diverticula were found in the sigmoid       colon.      The terminal ileum appeared normal.      No abnormalities noted on retroflexion.      The exam was otherwise without abnormality; random biopsies done to rule       out microscopic colitis. Impression:               - Few scattered diverticula in the sigmoid colon.                           - The examined portion of the ileum was normal.                           - The examination was otherwise normal-random                            biopsies done. Moderate Sedation:      MAC used. Recommendation:           - High fiber diet with augmented water consumption                            daily.                           - Continue  present medications.                           - Repeat colonoscopy in 10 years for surveillance.                           - Return to GI office in 4 weeks.                           - If the patient has any abnormal GI symptoms in                            the interim, she has been advised to call the                            office ASAP for further recommendations. Procedure Code(s):        --- Professional ---                           [phone], Colonoscopy, flexible; with biopsy, single                            or multiple Diagnosis Code(s):        --- Professional ---                           R19.4, Change in bowel habit  K57.30, Diverticulosis of large intestine without                            perforation or abscess without bleeding                           Z12.11, Encounter for screening for malignant                            neoplasm of colon CPT copyright 2019 [employer]. All rights reserved. The codes documented in this report are preliminary and upon coder review may  be revised to meet current compliance requirements. Juanita Craver, MD Juanita Craver, MD 12/16/2019 8:06:45 AM This report has been signed electronically. Number of Addenda: 0

## 2019-12-16 NOTE — Discharge Instructions (Signed)

## 2019-12-16 NOTE — Transfer of Care (Signed)
Immediate Anesthesia Transfer of Care Note  Patient: [name]  Procedure(s) Performed: COLONOSCOPY WITH PROPOFOL (N/A ) BIOPSY  Patient Location: PACU  Anesthesia Type:MAC  Level of Consciousness: awake, alert  and oriented  Airway & Oxygen Therapy: Patient Spontanous Breathing and Patient connected to face mask oxygen  Post-op Assessment: Report given to RN and Post -op Vital signs reviewed and stable  Post vital signs: Reviewed and stable  Last Vitals:  Vitals Value Taken Time  BP    Temp    Pulse    Resp    SpO2      Last Pain:  Vitals:   12/16/19 0659  TempSrc: Oral  PainSc: 0-No pain         Complications: No apparent anesthesia complications

## 2019-12-16 NOTE — Anesthesia Postprocedure Evaluation (Signed)
Anesthesia Post Note  Patient: [name]  Procedure(s) Performed: COLONOSCOPY WITH PROPOFOL (N/A ) BIOPSY     Patient location during evaluation: Endoscopy Anesthesia Type: MAC Level of consciousness: awake and alert Pain management: pain level controlled Vital Signs Assessment: post-procedure vital signs reviewed and stable Respiratory status: spontaneous breathing, nonlabored ventilation, respiratory function stable and patient connected to nasal cannula oxygen Cardiovascular status: blood pressure returned to baseline and stable Postop Assessment: no apparent nausea or vomiting Anesthetic complications: no    Last Vitals:  Vitals:   12/16/19 0810 12/16/19 0820  BP: 119/68 (!) 112/53  Pulse: 72 76  Resp: 16 17  Temp:    SpO2:      Last Pain:  Vitals:   12/16/19 0820  TempSrc:   PainSc: 0-No pain                 Barnet Glasgow

## 2019-12-16 NOTE — Anesthesia Procedure Notes (Signed)
Performed by: Harshal Sirmon D, CRNA Oxygen Delivery Method: Simple face mask       

## 2019-12-16 NOTE — H&P (Signed)
[name] is an 59 y.o. female.   Chief Complaint: Change in bowel habits. HPI: [name] is a 59 year old white female with multiple medical problems who presents to [facility] hospital today for a colonoscopy.  This is being done for further evaluation of changes in bowel habits with fecal incontinence and urgency.  See office notes for further details.  Past Medical History:  Diagnosis Date  . Allergic rhinitis    uses Flonase daily as needed and takes CLaritin daily  . Anxiety    takes Xanax daily as needed  . Asthma    Albuterol inhaler prn;SYmbicort daily  . Cerebral vascular malformation    sees dr Arnoldo Morale for monitoring as needed, sees dr lewitt for headaches every 4 months  . COPD (chronic obstructive pulmonary disease) ([place])   . Depression    takes Citalopram daily  . Diverticulitis at age 61  . Eczema   . Fibromyalgia   . GERD (gastroesophageal reflux disease)    takes Protonix daily  . Hard of hearing   . History of bronchitis as a child   . History of kidney stones   . History of migraine    last one 10+yrs ago  . History of staph infection 110yr ago  . Hyperlipidemia    takes Pravastatin daily  . IBS (irritable bowel syndrome)    mixed  . Insomnia   . Joint pain   . Joint swelling   . Lactose intolerance   . Nausea    takes Zofran daily as needed  . PFO (patent foramen ovale)   . Plaque psoriasis   . Pneumonia 276yrago   hx of  . PONV (postoperative nausea and vomiting)   . Postoperative anemia due to acute blood loss 02/08/2016  . Primary localized osteoarthritis of left knee   . Primary localized osteoarthritis of right knee 11/03/2015  . Rheumatoid arthritis(714.0) 2010   oa and ra;Rhemicade IV every 6wks and Metotrexate weekly  . Seizures ([id number] 03/21/14   takes Depakote daily  . Shortness of breath    with exertion  . Sleep apnea    study done >81yr3yro;uses CPAP nightly  . Spinal headache    patient states that she thinks  she had a spinal headache a long time ago  . Stress incontinence   . Stroke (HCC)[place]11/2014   left sided weakness  . Thyroid cyst   . Ventricular septal defect    2014 TEE lists large right to left shunt ASD/PFO (NO VSD mentioned)   Past Surgical History:  Procedure Laterality Date  . APPENDECTOMY  1981  . CARDIAC CATHETERIZATION  2004  . CHOLECYSTECTOMY  1981  . COLONOSCOPY    . ESOPHAGOGASTRODUODENOSCOPY    . FOOT SURGERY  1999   right ankle  . HERNIA REPAIR  2006   x2  . INCISIONAL HERNIA REPAIR  05/20/2012   Procedure: HERNIA REPAIR INCISIONAL;  Surgeon: ThomJoyice Fasterrnett, MD;  [facility];  Service: General;  Laterality: N/A;  . INCONTINENCE SURGERY  2010   sling done   . [place]  . KNEE ARTHROSCOPY  1992   left  . left foot plating and scarping for arthritis  2011  . right ear tube insertion  2011  . TEE WITHOUT CARDIOVERSION N/A 05/15/2013   Procedure: TRANSESOPHAGEAL ECHOCARDIOGRAM (TEE);  Surgeon: JagaLaverda Page;  [facility];  Laterality: N/A;  . thryoid biopsy    .  TONSILLECTOMY AND ADENOIDECTOMY  04/01/2014  . TONSILLECTOMY AND ADENOIDECTOMY Bilateral 04/01/2014   Procedure: BILATERAL TONSILLECTOMY AND ADENOIDECTOMY;  Surgeon: Ascencion Dike, MD;  Location: [employer];  Service: ENT;  Laterality: Bilateral;  . TOTAL ABDOMINAL HYSTERECTOMY  2001  . TOTAL KNEE ARTHROPLASTY Right 11/03/2015  . TOTAL KNEE ARTHROPLASTY Right 11/03/2015   Procedure: RIGHT TOTAL KNEE ARTHROPLASTY/RIGHT;  Surgeon: Elsie Saas, MD;  Location: [place];  Service: Orthopedics;  Laterality: Right;  . TOTAL KNEE ARTHROPLASTY Left 02/07/2016   Procedure: TOTAL KNEE ARTHROPLASTY;  Surgeon: Elsie Saas, MD;  Location: [place];  Service: Orthopedics;  Laterality: Left;  . TUBAL LIGATION  1990   Family History  Problem Relation Age of Onset  . Ovarian cancer Mother   . Diabetes Maternal Grandmother   . Arthritis Maternal Grandmother   .  Diabetes Brother   . Diabetes Paternal Grandmother   . Diabetes Maternal Grandfather   . Diabetes Paternal Grandfather   . Heart disease Brother   . Heart disease Other        Uncle  . Breast cancer Maternal Aunt    Social History:  reports that she quit smoking about 20 years ago. Her smoking use included cigarettes. She has a 60.00 pack-year smoking history. She has never used smokeless tobacco. She reports current alcohol use. She reports that she does not use drugs.  Allergies:  Allergies  Allergen Reactions  . Aquacel [Carboxymethylcellulose] Other (See Comments)    Blisters   . Nickel   . Sulfonamide Derivatives Anaphylaxis, Hives and Swelling    REACTION: swelling, tongue and hives  . Aspirin Nausea And Vomiting    REACTION: severe gi upset.  Pt states she can tolerate 81 mg asa only.   . Ciprofloxacin Itching and Swelling    REACTION: angioedema, urticaria  . Cosyntropin Hives and Swelling    REACTION: swelling, hives  . Adhesive [Tape] Other (See Comments)    Blisters skin  . Latex Other (See Comments)    other  . Amoxicillin Nausea And Vomiting    REACTION: GI upset  . Guaifenesin Rash    REACTION: head rash  . Moxifloxacin Other (See Comments)    gi upset  . Nsaids Other (See Comments)    Gi upset    Medications Prior to Admission  Medication Sig Dispense Refill  . albuterol (VENTOLIN HFA) 108 (90 Base) MCG/ACT inhaler Inhale 1-2 puffs into the lungs every 6 (six) hours as needed for wheezing or shortness of breath.    . ALPRAZolam (XANAX) 0.5 MG tablet Take 0.5 mg by mouth 2 (two) times daily as needed for anxiety.     Marland Kitchen amLODipine (NORVASC) 5 MG tablet TAKE 1 TABLET BY MOUTH ONCE DAILY (Patient taking differently: Take 5 mg by mouth daily. ) 90 tablet 3  . aspirin EC 81 MG tablet Take 81 mg by mouth daily.    . citalopram (CELEXA) 20 MG tablet TAKE 2 TABLETS BY MOUTH  DAILY (Patient taking differently: Take 20 mg by mouth in the morning and at bedtime. ) 90  tablet 0  . divalproex (DEPAKOTE ER) 500 MG 24 hr tablet TAKE 2 TABLETS BY MOUTH  DAILY (Patient taking differently: Take 500 mg by mouth in the morning and at bedtime. ) 60 tablet 0  . fluticasone (FLONASE) 50 MCG/ACT nasal spray Place 1 spray into both nostrils in the morning and at bedtime.     Marland Kitchen leflunomide (ARAVA) 20 MG tablet Take 20 mg by mouth at bedtime.     Marland Kitchen  Lifitegrast (XIIDRA) 5 % SOLN Apply 1 drop to eye 2 (two) times daily.    . mupirocin ointment (BACTROBAN) 2 % Apply 1 application topically in the morning, at noon, and at bedtime. Applied to affected area of toe    . pravastatin (PRAVACHOL) 20 MG tablet Take 1 tablet (20 mg total) by mouth every evening. (Patient taking differently: Take 20 mg by mouth at bedtime. ) 90 tablet 2  . SUPREP BOWEL PREP KIT 17.5-3.13-1.6 GM/177ML SOLN Take 354 mLs by mouth once.    . topiramate (TOPAMAX) 100 MG tablet TAKE 1 TABLET BY MOUTH TWO  TIMES DAILY (Patient taking differently: Take 100 mg by mouth in the morning and at bedtime. ) 180 tablet 0  . triamcinolone cream (KENALOG) 0.1 % Apply 1 application topically 2 (two) times daily as needed (skin irritation.).     Marland Kitchen Multiple Vitamin (MULTIVITAMIN WITH MINERALS) TABS tablet Take 1 tablet by mouth daily.      No results found for this or any previous visit (from the past 48 hour(s)). No results found.  Review of Systems  Constitutional: Negative.   HENT: Negative.   Eyes: Negative.   Respiratory: Negative.   Cardiovascular: Negative.   Gastrointestinal: Positive for diarrhea and nausea. Negative for abdominal distention, abdominal pain, anal bleeding, blood in stool, constipation and rectal pain.  Endocrine: Negative.   Genitourinary: Negative.   Musculoskeletal: Positive for arthralgias and back pain. Negative for joint swelling.   Blood pressure (!) 158/98, pulse 98, temperature 98.1 F (36.7 C), temperature source Oral, resp. rate (!) 22, height _0  (1.626 m), weight 73 kg, SpO2 97  %. Physical Exam  Constitutional: She is oriented to person, place, and time. She appears well-developed and well-nourished.  HENT:  Head: Normocephalic and atraumatic.  Eyes: Pupils are equal, round, and reactive to light. Conjunctivae and EOM are normal.  Cardiovascular: Normal rate and regular rhythm.  Respiratory: Effort normal and breath sounds normal.  GI: Soft. Bowel sounds are normal. She exhibits no distension and no mass. There is no abdominal tenderness. There is no rebound and no guarding.  Musculoskeletal:        General: Normal range of motion.     Cervical back: Normal range of motion and neck supple.  Neurological: She is alert and oriented to person, place, and time.  Skin: Skin is warm and dry.    Assessment/Plan Changes in bowel habits//colorectal cancer screening-proceed with a colonoscopy at this time.  Juanita Craver, MD 12/16/2019, 7:11 AM

## 2019-12-17 ENCOUNTER — Encounter: Payer: Self-pay | Admitting: *Deleted

## 2019-12-17 LAB — SURGICAL PATHOLOGY

## 2020-02-25 NOTE — Progress Notes (Signed)
Primary Physician/Referring:  Christain Sacramento, MD  Patient ID: [name], female    DOB: May 31, 1961, 59 y.o.   MRN: [id number]  No chief complaint on file.  HPI:    [name]  is a 59 y.o. Caucasian female with history of embolic stroke on 04/23/6282, randomized to medical arm in the "Reduce" secondary prevention of stroke trial with PFO. No recurrence of stroke since. Echocardiogram and stress test in Nov 2018 which was low to intermediate risk with abnormal EKG response and probably septal ischemia, but normal LVEF. Coronary CTA in Jan 2019 revealed mild coronary atherosclerosis.   Her past medical history is significant for psoriatic arthritis, sleep apnea on CPAP, rheumatoid arthritis, history of nephrolithiasis, history of cerebrovascular AV malformations felt to be small and stroke felt to be cardioembolic in June 6629, COPD and has 40+ year history of smoking cigarettes and follows Dr. Baird Lyons, COPD felt to be stable.  Patient is here for "Horizon" for active therapy randomization for treatment of LPA in patients with PAD/CAD/CVA.  No specific complaints today.  Past Medical History:  Diagnosis Date  . Allergic rhinitis    uses Flonase daily as needed and takes CLaritin daily  . Anxiety    takes Xanax daily as needed  . Asthma    Albuterol inhaler prn;SYmbicort daily  . Cerebral vascular malformation    sees dr Arnoldo Morale for monitoring as needed, sees dr lewitt for headaches every 4 months  . COPD (chronic obstructive pulmonary disease) ([place])   . Depression    takes Citalopram daily  . Diverticulitis at age 32  . Eczema   . Fibromyalgia   . GERD (gastroesophageal reflux disease)    takes Protonix daily  . Hard of hearing   . History of bronchitis as a child   . History of kidney stones   . History of migraine    last one 10+yrs ago  . History of staph infection 60yr ago  . Hyperlipidemia    takes Pravastatin daily  . IBS (irritable bowel syndrome)    mixed  . Insomnia   . Joint pain   . Joint swelling   . Lactose intolerance   . Nausea    takes Zofran daily as needed  . PFO (patent foramen ovale)   . Plaque psoriasis   . Pneumonia 23yrago   hx of  . PONV (postoperative nausea and vomiting)   . Postoperative anemia due to acute blood loss 02/08/2016  . Primary localized osteoarthritis of left knee   . Primary localized osteoarthritis of right knee 11/03/2015  . Rheumatoid arthritis(714.0) 2010   oa and ra;Rhemicade IV every 6wks and Metotrexate weekly  . Seizures ([id number] 03/21/14   takes Depakote daily  . Shortness of breath    with exertion  . Sleep apnea    study done >65yr67yro;uses CPAP nightly  . Spinal headache    patient states that she thinks she had a spinal headache a long time ago  . Stress incontinence   . Stroke (HCC)[place]11/2014   left sided weakness  . Thyroid cyst   . Ventricular septal defect    2014 TEE lists large right to left shunt ASD/PFO (NO VSD mentioned)   Past Surgical History:  Procedure Laterality Date  . APPENDECTOMY  1981  . BIOPSY  12/16/2019   Procedure: BIOPSY;  Surgeon: MannJuanita Craver;  [facility];  Service: Endoscopy;;  . CARDIAC CATHETERIZATION  2004  .  CHOLECYSTECTOMY  1981  . COLONOSCOPY    . COLONOSCOPY WITH PROPOFOL N/A 12/16/2019   Procedure: COLONOSCOPY WITH PROPOFOL;  Surgeon: Juanita Craver, MD;  [facility];  Service: Endoscopy;  Laterality: N/A;  . ESOPHAGOGASTRODUODENOSCOPY    . FOOT SURGERY  1999   right ankle  . HERNIA REPAIR  2006   x2  . INCISIONAL HERNIA REPAIR  05/20/2012   Procedure: HERNIA REPAIR INCISIONAL;  Surgeon: Joyice Faster. Cornett, MD;  [facility];  Service: General;  Laterality: N/A;  . INCONTINENCE SURGERY  2010   sling done   . [place]  2001  . KNEE ARTHROSCOPY  1992   left  . left foot plating and scarping for arthritis  2011  . right ear tube insertion  2011  . TEE WITHOUT CARDIOVERSION N/A 05/15/2013    Procedure: TRANSESOPHAGEAL ECHOCARDIOGRAM (TEE);  Surgeon: Laverda Page, MD;  Location: [place];  Service: Cardiovascular;  Laterality: N/A;  . thryoid biopsy    . TONSILLECTOMY AND ADENOIDECTOMY  04/01/2014  . TONSILLECTOMY AND ADENOIDECTOMY Bilateral 04/01/2014   Procedure: BILATERAL TONSILLECTOMY AND ADENOIDECTOMY;  Surgeon: Ascencion Dike, MD;  Location: [place];  Service: ENT;  Laterality: Bilateral;  . TOTAL ABDOMINAL HYSTERECTOMY  2001  . TOTAL KNEE ARTHROPLASTY Right 11/03/2015  . TOTAL KNEE ARTHROPLASTY Right 11/03/2015   Procedure: RIGHT TOTAL KNEE ARTHROPLASTY/RIGHT;  Surgeon: Elsie Saas, MD;  Location: [place];  Service: Orthopedics;  Laterality: Right;  . TOTAL KNEE ARTHROPLASTY Left 02/07/2016   Procedure: TOTAL KNEE ARTHROPLASTY;  Surgeon: Elsie Saas, MD;  Location: [place];  Service: Orthopedics;  Laterality: Left;  . TUBAL LIGATION  1990    Family History  Problem Relation Age of Onset  . Ovarian cancer Mother   . Diabetes Maternal Grandmother   . Arthritis Maternal Grandmother   . Diabetes Brother   . Diabetes Paternal Grandmother   . Diabetes Maternal Grandfather   . Diabetes Paternal Grandfather   . Heart disease Brother   . Heart disease Other        Uncle  . Breast cancer Maternal Aunt     Social History   Tobacco Use  . Smoking status: Former Smoker    Packs/day: 2.00    Years: 30.00    Pack years: 60.00    Types: Cigarettes    Quit date: 10/20/1999    Years since quitting: 20.3  . Smokeless tobacco: Never Used  . Tobacco comment: quit smoking in 2002  Substance Use Topics  . Alcohol use: Yes    Comment: occ   Marital Status: Married ROS  Review of Systems  Cardiovascular: Negative for chest pain, dyspnea on exertion and leg swelling.  Gastrointestinal: Negative for melena.   Objective   Vitals with BMI 12/16/2019 12/16/2019 12/16/2019  Height - - -  Weight - - -  BMI - - -  Systolic 950 932 671  Diastolic 53 68 68  Pulse 76 72 73    There  were no vitals taken for this visit. There is no height or weight on file to calculate BMI.   Physical Exam  Constitutional: She appears well-developed and well-nourished.  HENT:  Head: Atraumatic.  Eyes: Conjunctivae are normal.  Cardiovascular: Regular rhythm, normal heart sounds and intact distal pulses. Bradycardia present. Exam reveals no gallop.  No murmur heard. No leg edema, no JVD.  Pulmonary/Chest: Effort normal and breath sounds normal.  Abdominal: Soft. Bowel sounds are normal.  Musculoskeletal:  General: Normal range of motion.     Cervical back: Neck supple.   Laboratory examination:    No results for input(s): NA, K, CL, CO2, GLUCOSE, BUN, CREATININE, CALCIUM, GFRNONAA, GFRAA in the last 8760 hours.  HEMOGLOBIN A1C Lab Results  Component Value Date   HGBA1C 5.6 03/27/2013   MPG 114 03/27/2013   TSH No results for input(s): TSH in the last 8760 hours.   External Labs: 11/17/2019: Sodium 142, Potassium 3.4 (L), Chloride 111 (H), CO2 22 (L), BUN 9,Glucose 93,  Creatinine 0.63, Calcium 9.4, Total Protein 7.1, Albumin 4.4, Total Bilirubin 0.4  Alkaline Phosphatase 70, AST 14, ALT 13, Anion Gap 9, Est. GFR Non-AA >=90   TSH 3.809  Total Vitamin D 25-Hydroxy 38. Vitamin B12 386.   HbA1C 5.2 %  Lipid Profile: LDL Direct 117, Total Cholesterol 191, Triglycerides 232 (H), HDL Cholesterol 40, Total Chol / HDL Cholesterol 4.8 (H), Non-HDL Cholesterol 151    Comprehensive Metabolic Panel 0/02/1101 1:11 PM [facility] Component Name Value Ref Range  Sodium 140 135 - 146 MMOL/L  Potassium 4.0 3.5 - 5.3 MMOL/L  Chloride 111 (H) 98 - 110 MMOL/L  CO2 22 (L) 23 - 30 MMOL/L  BUN 11 8 - 24 MG/DL  Glucose 97 70 - 99 MG/DL  Creatinine 0.54 0.5 - 1.5 MG/DL  Calcium 9.1 8.5 - 10.5 MG/DL  Total Protein 6.8 6 - 8.3 G/DL  Albumin  4.2 3.5 - 5 G/DL  Total Bilirubin 0.4 0.1 - 1.2 MG/DL  Alkaline Phosphatase 65 25 - 125 IU/L or U/L  AST (SGOT)  13 5 - 40 IU/L or U/L  ALT (SGPT) 12 5 - 50 IU/L or U/L  Anion Gap 7 4 - 14 MMOL/L  Est. GFR Non-African American >=90    Lipid Profile 05/19/2019 6:02 PM [facility] Component Name Value Ref Range  LDL Direct 116 <130 mg/dL  Total Cholesterol 176 25 - 199 MG/DL  Triglycerides 178 (H) 10 - 150 MG/DL  HDL Cholesterol 45 35 - 135 MG/DL  Total Chol / HDL Cholesterol 3.9 <4.5   Non-HDL Cholesterol 131  Comment:  TARGET: <(LDL-C TARGET + 30)MG/DL    HB 14.8/HCT 43.9, mild macrocytosis present.  Platelets 170.  WBC 6.0.  Medications and allergies   Allergies  Allergen Reactions  . Aquacel [Carboxymethylcellulose] Other (See Comments)    Blisters   . Nickel   . Sulfonamide Derivatives Anaphylaxis, Hives and Swelling    REACTION: swelling, tongue and hives  . Aspirin Nausea And Vomiting    REACTION: severe gi upset.  Pt states she can tolerate 81 mg asa only.   . Ciprofloxacin Itching and Swelling    REACTION: angioedema, urticaria  . Cosyntropin Hives and Swelling    REACTION: swelling, hives  . Adhesive [Tape] Other (See Comments)    Blisters skin  . Latex Other (See Comments)    other  . Amoxicillin Nausea And Vomiting    REACTION: GI upset  . Guaifenesin Rash    REACTION: head rash  . Moxifloxacin Other (See Comments)    gi upset  . Nsaids Other (See Comments)    Gi upset    Current Outpatient Medications  Medication Instructions  . albuterol (VENTOLIN HFA) 108 (90 Base) MCG/ACT inhaler 1-2 puffs, Inhalation, Every 6 hours PRN  . ALPRAZolam (XANAX) 0.5 mg, 2 times daily PRN  . amLODipine (NORVASC) 5 MG tablet TAKE 1 TABLET BY MOUTH ONCE DAILY  . aspirin  EC 81 mg, Oral, Daily  . citalopram (CELEXA) 20 MG tablet TAKE 2 TABLETS BY MOUTH  DAILY  . divalproex (DEPAKOTE ER) 500 MG 24 hr tablet TAKE 2 TABLETS BY MOUTH  DAILY  . fluticasone (FLONASE) 50 MCG/ACT nasal spray 1 spray, Each Nare, 2 times daily  . leflunomide (ARAVA) 20 mg, Oral, Daily at  bedtime  . Lifitegrast (XIIDRA) 5 % SOLN 1 drop, Ophthalmic, 2 times daily  . Multiple Vitamin (MULTIVITAMIN WITH MINERALS) TABS tablet 1 tablet, Oral, Daily  . mupirocin ointment (BACTROBAN) 2 % 1 application, Topical, 3 times daily, Applied to affected area of toe  . pravastatin (PRAVACHOL) 20 mg, Oral, Every evening  . SUPREP BOWEL PREP KIT 17.5-3.13-1.6 GM/177ML SOLN 354 mLs, Oral,  Once  . topiramate (TOPAMAX) 100 MG tablet TAKE 1 TABLET BY MOUTH TWO  TIMES DAILY  . triamcinolone cream (KENALOG) 0.1 % 1 application, Topical, 2 times daily PRN   Radiology:   Coronary CTA 11/13/2017: 1. Coronary calcium score of 19. This was 81 percentile for age and sex matched control. 2. Normal coronary origin with right dominance. 3. Minimal plaque in the left main, proximal and mid LAD. Aggressive risk factor modification is recommended. 4.  PFO is present. 5. Lungs clear.   Cardiac Studies:   US Venous Lower Left 03/21/2016: No evidence of DVT.  Exercise myoview stress 09/03/2017: 1. The resting electrocardiogram demonstrated normal sinus rhythm, normal resting conduction and no resting arrhythmias. With peak exercise there was 1 mm upsloping ST depression which returned to baseline at 2 minutes into recovery, equivocal for ischemia. The patient performed treadmill exercise using a Bruce protocol, completing 6:30 minutes. The patient completed an estimated workload of 7.46 METS, achieved 88% of MPHR (THR 85% for age). Stress symptoms included dyspnea, dizziness, headache, chest pressure. The stress test was terminated because of fatigue. Hypertensive BP response. Resting blood pressure was 142/88 mmHg and peak blood pressure was 212/106 mmHg. 2. There is a small area of mild ischemia in the basal inferoseptal and mid inferoseptal myocardial wall(s). The left ventricular ejection fraction was calculated or visually estimated to be 63%. This is a low risk study.  Echocardiogram 08/27/2017: Left  ventricle cavity is normal in size. Normal global wall motion. LVEF is 55-60%. Normal diastolic filling pattern. Documented PFO on previous TEE. Interatrial septum not adequately visualized on this study. No significant valvular abnormality. No evidence of pulmonary hypertension.   EKG    08/18/2019: Sinus bradycardia at rate of 53 bpm, normal axis, incomplete right bundle branch block.  Nonspecific T abnormality. No significant change from  EKG 08/21/2018: Marked sinus bradycardia at rate of 56 bpm.   Assessment     ICD-10-CM   1. Coronary artery calcification seen on CAT scan  I25.10   2. PFO (patent foramen ovale)  Q21.1   3. Dyspnea on exertion  R06.00   4. Mixed hyperlipidemia  E78.2   5. Rheumatoid arthritis involving both hands with positive rheumatoid factor (HCC)  M05.741    M05.742     No orders of the defined types were placed in this encounter.  There are no discontinued medications.  Recommendations:   [name]  is a 59 y.o. Caucasian female with history of embolic stroke on 8/46/9629, randomized to medical arm in the "Reduce" secondary prevention of stroke trial with PFO. No recurrence of stroke since. Echocardiogram and stress test in Nov 2018 which was low to intermediate risk with abnormal EKG response and probably septal ischemia,  but normal LVEF. Coronary CTA in Jan 2019 revealed mild coronary atherosclerosis.   Her past medical history is significant for psoriatic arthritis, sleep apnea on CPAP, rheumatoid arthritis, history of nephrolithiasis, history of cerebrovascular AV malformations felt to be small and stroke felt to be cardioembolic in June 6712, COPD and has 40+ year history of smoking cigarettes and follows Dr. Baird Lyons, COPD felt to be stable.  Patient is here for "Horizon" for active therapy randomization for treatment of LPA in patients with PAD/CAD/CVA.  No specific complaints today.  She has made significant changes in her lifestyle, has  lost weight and looks the best in a while.  I have congratulated her.  I will see her back in 6 months.  All questions regarding randomization discussed with the patient.  Adrian Prows, MD, [facility] 02/26/2020, 12:05 PM [facility] [place] Pager: [phone] Office: [phone]

## 2020-02-26 ENCOUNTER — Other Ambulatory Visit: Payer: Self-pay

## 2020-02-26 ENCOUNTER — Encounter: Payer: Medicare Other | Admitting: Cardiology

## 2020-02-26 DIAGNOSIS — I2510 Atherosclerotic heart disease of native coronary artery without angina pectoris: Secondary | ICD-10-CM

## 2020-02-26 DIAGNOSIS — R0609 Other forms of dyspnea: Secondary | ICD-10-CM

## 2020-02-26 DIAGNOSIS — M05742 Rheumatoid arthritis with rheumatoid factor of left hand without organ or systems involvement: Secondary | ICD-10-CM

## 2020-02-26 DIAGNOSIS — Q2112 Patent foramen ovale: Secondary | ICD-10-CM

## 2020-02-26 DIAGNOSIS — E782 Mixed hyperlipidemia: Secondary | ICD-10-CM

## 2020-03-25 ENCOUNTER — Other Ambulatory Visit: Payer: Self-pay

## 2020-03-25 ENCOUNTER — Encounter: Payer: Medicare Other | Admitting: Cardiology

## 2020-03-25 DIAGNOSIS — Z006 Encounter for examination for normal comparison and control in clinical research program: Secondary | ICD-10-CM

## 2020-03-25 NOTE — Progress Notes (Signed)
Research visit.  Normal general appearance.

## 2020-04-22 ENCOUNTER — Other Ambulatory Visit: Payer: Self-pay

## 2020-04-22 ENCOUNTER — Encounter: Payer: Self-pay | Admitting: Cardiology

## 2020-04-22 DIAGNOSIS — Z006 Encounter for examination for normal comparison and control in clinical research program: Secondary | ICD-10-CM

## 2020-04-23 ENCOUNTER — Encounter: Payer: Medicare Other | Admitting: Cardiology

## 2020-04-24 NOTE — Progress Notes (Signed)
Research visit, patient enrolled in active of [facility] LPA trial, [facility].

## 2020-05-14 DIAGNOSIS — J449 Chronic obstructive pulmonary disease, unspecified: Secondary | ICD-10-CM | POA: Diagnosis present

## 2020-05-24 ENCOUNTER — Encounter: Payer: Medicare Other | Admitting: Cardiology

## 2020-05-24 ENCOUNTER — Other Ambulatory Visit: Payer: Self-pay

## 2020-05-24 NOTE — Progress Notes (Unsigned)
Research visit.   Yates Decamp, MD, [facility] 05/24/2020, 12:48 PM Office: [phone]

## 2020-06-24 ENCOUNTER — Encounter: Payer: Medicare Other | Admitting: Cardiology

## 2020-06-24 ENCOUNTER — Other Ambulatory Visit: Payer: Self-pay

## 2020-07-18 ENCOUNTER — Other Ambulatory Visit: Payer: Self-pay | Admitting: Cardiology

## 2020-07-18 DIAGNOSIS — I10 Essential (primary) hypertension: Secondary | ICD-10-CM

## 2020-07-22 ENCOUNTER — Encounter: Payer: Medicare Other | Admitting: Cardiology

## 2020-08-18 ENCOUNTER — Ambulatory Visit: Payer: Medicare Other | Admitting: Cardiology

## 2020-08-18 ENCOUNTER — Encounter: Payer: Self-pay | Admitting: Cardiology

## 2020-08-18 ENCOUNTER — Other Ambulatory Visit: Payer: Self-pay

## 2020-08-18 VITALS — BP 129/84 | HR 59 | Resp 17 | Ht 64.0 in | Wt 145.0 lb

## 2020-08-18 DIAGNOSIS — Q2112 Patent foramen ovale: Secondary | ICD-10-CM

## 2020-08-18 DIAGNOSIS — Z006 Encounter for examination for normal comparison and control in clinical research program: Secondary | ICD-10-CM

## 2020-08-18 DIAGNOSIS — E7800 Pure hypercholesterolemia, unspecified: Secondary | ICD-10-CM

## 2020-08-18 DIAGNOSIS — I10 Essential (primary) hypertension: Secondary | ICD-10-CM

## 2020-08-18 DIAGNOSIS — R001 Bradycardia, unspecified: Secondary | ICD-10-CM

## 2020-08-18 DIAGNOSIS — E782 Mixed hyperlipidemia: Secondary | ICD-10-CM

## 2020-08-18 DIAGNOSIS — Q211 Atrial septal defect: Secondary | ICD-10-CM

## 2020-08-18 DIAGNOSIS — I2510 Atherosclerotic heart disease of native coronary artery without angina pectoris: Secondary | ICD-10-CM

## 2020-08-18 MED ORDER — PRAVASTATIN SODIUM 20 MG PO TABS: 20.0000 mg | ORAL_TABLET | Freq: Every day | ORAL | 3 refills | Status: DC

## 2020-08-18 MED ORDER — AMLODIPINE BESYLATE 5 MG PO TABS: 5.0000 mg | ORAL_TABLET | Freq: Every day | ORAL | 3 refills | Status: DC

## 2020-08-18 NOTE — Progress Notes (Signed)
Primary Physician/Referring:  Christain Sacramento, MD  Patient ID: [name], female    DOB: 1960/10/18, 59 y.o.   MRN: [id number]  Chief Complaint  Patient presents with  . Coronary Artery Disease  . Cerebrovascular Accident  . Follow-up    1 year   HPI:    [name]  is a 59 y.o. Caucasian female with history of embolic stroke on 7/86/7544, randomized to medical arm in the "Reduce" secondary prevention of stroke trial with PFO. No recurrence of stroke since. Echocardiogram and stress test in Nov 2018 which was low to intermediate risk with abnormal EKG response and probably septal ischemia, but normal LVEF. Coronary CTA in Jan 2019 revealed mild coronary atherosclerosis.   Her past medical history is significant for psoriatic arthritis, sleep apnea on CPAP, rheumatoid arthritis, history of nephrolithiasis, history of cerebrovascular AV malformations felt to be small and stroke felt to be cardioembolic in June 9201, COPD and has 40+ year history of smoking cigarettes and follows Dr. Baird Lyons, COPD felt to be stable.  From cardiac standpoint she is doing well, had chronic dyspnea and cough and wheezing which is stable. No chest pain. Chronic fatigue present.   Past Medical History:  Diagnosis Date  . Allergic rhinitis    uses Flonase daily as needed and takes CLaritin daily  . Anxiety    takes Xanax daily as needed  . Asthma    Albuterol inhaler prn;SYmbicort daily  . Cerebral vascular malformation    sees dr Arnoldo Morale for monitoring as needed, sees dr lewitt for headaches every 4 months  . COPD (chronic obstructive pulmonary disease) ([place])   . Depression    takes Citalopram daily  . Diverticulitis at age 67  . Eczema   . Fibromyalgia   . GERD (gastroesophageal reflux disease)    takes Protonix daily  . Hard of hearing   . History of bronchitis as a child   . History of kidney stones   . History of migraine    last one 10+yrs ago  . History of staph infection  79yr ago  . Hyperlipidemia    takes Pravastatin daily  . IBS (irritable bowel syndrome)    mixed  . Insomnia   . Joint pain   . Joint swelling   . Lactose intolerance   . Nausea    takes Zofran daily as needed  . PFO (patent foramen ovale)   . Plaque psoriasis   . Pneumonia [place]   hx of  . PONV (postoperative nausea and vomiting)   . Postoperative anemia due to acute blood loss 02/08/2016  . Primary localized osteoarthritis of left knee   . Primary localized osteoarthritis of right knee 11/03/2015  . Rheumatoid arthritis(714.0) 2010   oa and ra;Rhemicade IV every 6wks and Metotrexate weekly  . Seizures ([id number] 03/21/14   takes Depakote daily  . Shortness of breath    with exertion  . Sleep apnea    study done >40yr4yro;uses CPAP nightly  . Spinal headache    patient states that she thinks she had a spinal headache a long time ago  . Stress incontinence   . Stroke (HCC)[place]11/2014   left sided weakness  . Thyroid cyst   . Ventricular septal defect    2014 TEE lists large right to left shunt ASD/PFO (NO VSD mentioned)   Past Surgical History:  Procedure Laterality Date  . APPENDECTOMY  1981  . BIOPSY  12/16/2019  Procedure: BIOPSY;  Surgeon: Juanita Craver, MD;  Location: [facility];  Service: Endoscopy;;  . CARDIAC CATHETERIZATION  2004  . CHOLECYSTECTOMY  1981  . COLONOSCOPY    . COLONOSCOPY WITH PROPOFOL N/A 12/16/2019   Procedure: COLONOSCOPY WITH PROPOFOL;  Surgeon: Juanita Craver, MD;  Location: [facility];  Service: Endoscopy;  Laterality: N/A;  . ESOPHAGOGASTRODUODENOSCOPY    . FOOT SURGERY  1999   right ankle  . HERNIA REPAIR  2006   x2  . INCISIONAL HERNIA REPAIR  05/20/2012   Procedure: HERNIA REPAIR INCISIONAL;  Surgeon: Joyice Faster. Cornett, MD;  Location: [facility];  Service: General;  Laterality: N/A;  . INCONTINENCE SURGERY  2010   sling done   . [place]  2001  . KNEE ARTHROSCOPY  1992   left  . left foot plating and scarping for  arthritis  2011  . right ear tube insertion  2011  . TEE WITHOUT CARDIOVERSION N/A 05/15/2013   Procedure: TRANSESOPHAGEAL ECHOCARDIOGRAM (TEE);  Surgeon: Laverda Page, MD;  Location: [facility];  Service: Cardiovascular;  Laterality: N/A;  . thryoid biopsy    . TONSILLECTOMY AND ADENOIDECTOMY  04/01/2014  . TONSILLECTOMY AND ADENOIDECTOMY Bilateral 04/01/2014   Procedure: BILATERAL TONSILLECTOMY AND ADENOIDECTOMY;  Surgeon: Ascencion Dike, MD;  Location: [name];  Service: ENT;  Laterality: Bilateral;  . TOTAL ABDOMINAL HYSTERECTOMY  2001  . TOTAL KNEE ARTHROPLASTY Right 11/03/2015  . TOTAL KNEE ARTHROPLASTY Right 11/03/2015   Procedure: RIGHT TOTAL KNEE ARTHROPLASTY/RIGHT;  Surgeon: Elsie Saas, MD;  Location: [facility];  Service: Orthopedics;  Laterality: Right;  . TOTAL KNEE ARTHROPLASTY Left 02/07/2016   Procedure: TOTAL KNEE ARTHROPLASTY;  Surgeon: Elsie Saas, MD;  Location: [facility];  Service: Orthopedics;  Laterality: Left;  . TUBAL LIGATION  1990   Social History   Tobacco Use  . Smoking status: Former Smoker    Packs/day: 2.00    Years: 30.00    Pack years: 60.00    Types: Cigarettes    Quit date: 10/20/1999    Years since quitting: 20.8  . Smokeless tobacco: Never Used  . Tobacco comment: quit smoking in 2002  Substance Use Topics  . Alcohol use: Yes    Comment: occ    ROS  Review of Systems  Constitutional: Positive for malaise/fatigue (chronic). Negative for chills, decreased appetite and weight gain.  Cardiovascular: Positive for dyspnea on exertion (chronic). Negative for leg swelling and syncope.  Respiratory: Positive for shortness of breath and wheezing.   Endocrine: Negative for cold intolerance.  Hematologic/Lymphatic: Does not bruise/bleed easily.  Musculoskeletal: Positive for arthritis and neck pain (radiates to both shoulders and upper back). Negative for joint swelling.  Gastrointestinal: Negative for abdominal pain, anorexia, change in bowel habit,  hematochezia and melena.  Neurological: Negative for disturbances in coordination, headaches and light-headedness.  Psychiatric/Behavioral: Negative for depression and substance abuse.  All other systems reviewed and are negative.  Objective   Vitals with BMI 08/18/2020 12/16/2019 12/16/2019  Height _0  - -  Weight 145 lbs - -  BMI 09.23 - -  Systolic 300 762 263  Diastolic 84 53 68  Pulse 59 76 72    Blood pressure 129/84, pulse (!) 59, resp. rate 17, height _1  (1.626 m), weight 145 lb (65.8 kg), SpO2 95 %. Body mass index is 24.89 kg/m.   Physical Exam Constitutional:      Appearance: She is well-developed.  HENT:     Head: Atraumatic.  Eyes:  Conjunctiva/sclera: Conjunctivae normal.  Neck:     Thyroid: No thyromegaly.     Vascular: No JVD.  Cardiovascular:     Rate and Rhythm: Regular rhythm. Bradycardia present.     Pulses: Intact distal pulses.     Heart sounds: Normal heart sounds. No murmur heard.  No gallop.      Comments: No leg edema, no JVD. Pulmonary:     Effort: Pulmonary effort is normal.     Breath sounds: Normal breath sounds.  Abdominal:     General: Bowel sounds are normal.     Palpations: Abdomen is soft.  Musculoskeletal:        General: Normal range of motion.     Cervical back: Neck supple.  Skin:    General: Skin is warm and dry.  Neurological:     Mental Status: She is alert.    Laboratory examination:    Labs 08/17/2020:  Hb 13.5/HCT 40.1, platelets 170.  Normal indicis.  Serum glucose 92 mg, BUN 8, creatinine 0.58, EGFR >60 mL.  Sodium 146, potassium 3.7, CO2 18.  CMP otherwise normal.  Quantitative C-reactive protein elevated at 4.0.  External Lab 11/17/2019:  Sodium 142, potassium 3.4, BUN 9, creatinine 0.60, EGFR >60 mL.  CMP otherwise normal.  TSH normal.  Vitamin D 38.  Total cholesterol 191, triglycerides 232, HDL 40, LDL 117.  Hb 14.4/HCT 43.2, platelets 165.  Medications and allergies   Allergies  Allergen  Reactions  . Aquacel [Carboxymethylcellulose] Other (See Comments)    Blisters   . Nickel   . Sulfonamide Derivatives Anaphylaxis, Hives and Swelling    REACTION: swelling, tongue and hives  . Aspirin Nausea And Vomiting    REACTION: severe gi upset.  Pt states she can tolerate 81 mg asa only.   . Ciprofloxacin Itching and Swelling    REACTION: angioedema, urticaria  . Cosyntropin Hives and Swelling    REACTION: swelling, hives  . Adhesive [Tape] Other (See Comments)    Blisters skin  . Latex Other (See Comments)    other  . Amoxicillin Nausea And Vomiting    REACTION: GI upset  . Guaifenesin Rash    REACTION: head rash  . Moxifloxacin Other (See Comments)    gi upset  . Nsaids Other (See Comments)    Gi upset    Current Outpatient Medications on File Prior to Visit  Medication Sig Dispense Refill  . albuterol (VENTOLIN HFA) 108 (90 Base) MCG/ACT inhaler Inhale 1-2 puffs into the lungs every 6 (six) hours as needed for wheezing or shortness of breath.    Marland Kitchen alendronate (FOSAMAX) 70 MG tablet Take 1 tablet by mouth daily.    Marland Kitchen ALPRAZolam (XANAX) 0.5 MG tablet Take 0.5 mg by mouth 2 (two) times daily as needed for anxiety.     Marland Kitchen aspirin EC 81 MG tablet Take 81 mg by mouth daily.    . citalopram (CELEXA) 20 MG tablet TAKE 2 TABLETS BY MOUTH  DAILY (Patient taking differently: Take 20 mg by mouth in the morning and at bedtime. ) 90 tablet 0  . divalproex (DEPAKOTE ER) 500 MG 24 hr tablet TAKE 2 TABLETS BY MOUTH  DAILY (Patient taking differently: Take 500 mg by mouth in the morning and at bedtime. ) 60 tablet 0  . fluticasone (FLONASE) 50 MCG/ACT nasal spray Place 1 spray into both nostrils in the morning and at bedtime.     Marland Kitchen HYDROcodone-homatropine (HYCODAN) 5-1.5 MG/5ML syrup Take by mouth as needed.    Marland Kitchen  leflunomide (ARAVA) 20 MG tablet Take 20 mg by mouth at bedtime.     Marland Kitchen Lifitegrast (XIIDRA) 5 % SOLN Apply 1 drop to eye 2 (two) times daily.    . mupirocin ointment (BACTROBAN)  2 % Apply 1 application topically in the morning, at noon, and at bedtime. Applied to affected area of toe    . topiramate (TOPAMAX) 100 MG tablet TAKE 1 TABLET BY MOUTH TWO  TIMES DAILY (Patient taking differently: Take 100 mg by mouth in the morning and at bedtime. ) 180 tablet 0  . traMADol (ULTRAM) 50 MG tablet Take 50 mg by mouth 2 (two) times daily as needed.    . triamcinolone cream (KENALOG) 0.1 % Apply 1 application topically 2 (two) times daily as needed (skin irritation.).      No current facility-administered medications on file prior to visit.    Radiology:   Coronary CTA 11/13/2017: 1. Coronary calcium score of 19. This was 61 percentile for age and sex matched control. 2. Normal coronary origin with right dominance. 3. Minimal plaque in the left main, proximal and mid LAD. Aggressive risk factor modification is recommended. 4.  PFO is present. 5. Lungs clear.   Cardiac Studies:   Exercise myoview stress 09/03/2017: 1. The resting electrocardiogram demonstrated normal sinus rhythm, normal resting conduction and no resting arrhythmias. With peak exercise there was 1 mm upsloping ST depression which returned to baseline at 2 minutes into recovery, equivocal for ischemia. The patient performed treadmill exercise using a Bruce protocol, completing 6:30 minutes. The patient completed an estimated workload of 7.46 METS, achieved 88% of MPHR (THR 85% for age). Stress symptoms included dyspnea, dizziness, headache, chest pressure. The stress test was terminated because of fatigue. Hypertensive BP response. Resting blood pressure was 142/88 mmHg and peak blood pressure was 212/106 mmHg. 2. There is a small area of mild ischemia in the basal inferoseptal and mid inferoseptal myocardial wall(s). The left ventricular ejection fraction was calculated or visually estimated to be 63%. This is a low risk study.  Echocardiogram [08/27/2017]: Left ventricle cavity is normal in size. Normal global  wall motion. LVEF is 55-60%. Normal diastolic filling pattern. Documented PFO on previous TEE. Interatrial septum not adequately visualized on this study. No significant valvular abnormality. No evidence of pulmonary hypertension.    EKG:    EKG normal sinus rhythm at rate of 47 bpm, normal axis, incomplete right bundle branch block.  No evidence of ischemia.  Poor quality EKG, baseline artifact.  Compared to 08/18/2019, sinus bradycardia at rate of 53 bpm.  Otherwise no change.  No significant change from 08/21/2018.   Assessment     ICD-10-CM   1. Coronary artery calcification seen on CAT scan  I25.10 EKG 12-Lead  2. Sinus bradycardia by electrocardiogram  R00.1   3. PFO (patent foramen ovale)  Q21.1   4. Mixed hyperlipidemia  E78.2   5. Research study patient: [facility]  impact of lipoprotein(a) lowering with pelacarsen on CV events  Z00.6   6. Essential hypertension  I10 amLODipine (NORVASC) 5 MG tablet  7. Pure hypercholesterolemia  E78.00 pravastatin (PRAVACHOL) 20 MG tablet      Recommendations:   LENDORA KEYS  is a 59 y.o. Caucasian female with history of embolic stroke on 0/97/3532, randomized to medical arm in the "Reduce" secondary prevention of stroke trial with PFO. No recurrence of stroke since. Echocardiogram and stress test in Nov 2018 which was low to intermediate risk with abnormal EKG response and probably  septal ischemia, but normal LVEF. Coronary CTA in Jan 2019 revealed mild coronary atherosclerosis.   Her past medical history is significant for psoriatic arthritis, sleep apnea on CPAP, rheumatoid arthritis, history of nephrolithiasis, history of cerebrovascular AV malformations felt to be small and stroke felt to be cardioembolic in June 3419, COPD and has 40+ year history of smoking cigarettes and follows Dr. Baird Lyons, COPD felt to be stable.  Her blood pressure is well controlled on amlodipine, she is not been able to tolerate higher doses of pravastatin  and she is presently enrolled in research for secondary prevention of vascular disease.  Patient in  research.  Please see protocol below.  [facility] (protocol [id number]) Description: A randomized double-blind, placebo-controlled, multicenter trialassessing the impact of lipoprotein(a) lowering with pelacarsen (TQJ230) on major cardiovascular events in patients with established cardiovascular disease.  She is presently doing well, I reviewed her labs, renal function is normal and CBC is also normal.  No changes in the medications were done today.  I will continue to see her on annual basis.   Adrian Prows, MD, [facility] 08/18/2020, 1:29 PM Office: [phone] Pager: [phone]

## 2021-03-21 ENCOUNTER — Other Ambulatory Visit: Payer: Self-pay

## 2021-03-21 MED ORDER — NITROGLYCERIN 0.4 MG SL SUBL: 0.4000 mg | SUBLINGUAL_TABLET | SUBLINGUAL | 3 refills | Status: DC | PRN

## 2021-06-22 ENCOUNTER — Other Ambulatory Visit: Payer: Self-pay

## 2021-06-22 ENCOUNTER — Ambulatory Visit: Payer: Medicare Other | Admitting: Cardiology

## 2021-06-22 ENCOUNTER — Encounter: Payer: Self-pay | Admitting: Cardiology

## 2021-06-22 VITALS — BP 131/78 | HR 70 | Temp 98.2°F | Resp 17 | Ht 64.0 in | Wt 165.6 lb

## 2021-06-22 DIAGNOSIS — R0600 Dyspnea, unspecified: Secondary | ICD-10-CM

## 2021-06-22 DIAGNOSIS — R0609 Other forms of dyspnea: Secondary | ICD-10-CM

## 2021-06-22 DIAGNOSIS — E7800 Pure hypercholesterolemia, unspecified: Secondary | ICD-10-CM

## 2021-06-22 DIAGNOSIS — R0789 Other chest pain: Secondary | ICD-10-CM

## 2021-06-22 DIAGNOSIS — Z006 Encounter for examination for normal comparison and control in clinical research program: Secondary | ICD-10-CM

## 2021-06-22 DIAGNOSIS — I10 Essential (primary) hypertension: Secondary | ICD-10-CM

## 2021-06-22 MED ORDER — PRAVASTATIN SODIUM 20 MG PO TABS: 20.0000 mg | ORAL_TABLET | Freq: Every day | ORAL | 3 refills | Status: DC

## 2021-06-22 NOTE — Progress Notes (Signed)
Primary Physician/Referring:  Christain Sacramento, MD  Patient ID: [name], female    DOB: 11-06-1960, 60 y.o.   MRN: [id number]  Chief Complaint  Patient presents with   Chest Pain   Hyperlipidemia    HPI:    [name]  is a 60 y.o. Caucasian female with history of embolic stroke on 0/96/2836, randomized to medical arm in the "Reduce" secondary prevention of stroke trial with PFO. No recurrence of stroke since. Echocardiogram and stress test in Nov 2018 which was low to intermediate risk with abnormal EKG response and probably septal ischemia, but normal LVEF. Coronary CTA in Jan 2019 revealed mild coronary atherosclerosis.   Her past medical history is significant for psoriatic arthritis, rheumatoid arthritis, history of nephrolithiasis, history of cerebrovascular AV malformations felt to be small and stroke felt to be cardioembolic in June 6294, sleep apnea on CPAP, COPD and has 40+ year history of smoking cigarettes and follows Dr. Baird Lyons.  She made appointment to see me for frequent episodes of chest pain described as tightness that lasted few seconds to few minutes with exertion activity, but no more than 1 to 2 minutes.  She is also noticed gradually worsening dyspnea and also states that she has been having difficulty with her CPAP machine and mask.  She now has an appointment to see Dr. Baird Lyons soon.   Past Medical History:  Diagnosis Date   Allergic rhinitis    uses Flonase daily as needed and takes CLaritin daily   Anxiety    takes Xanax daily as needed   Asthma    Albuterol inhaler prn;SYmbicort daily   Cerebral vascular malformation    sees dr Arnoldo Morale for monitoring as needed, sees dr lewitt for headaches every 4 months   COPD (chronic obstructive pulmonary disease) ([place])    Depression    takes Citalopram daily   Diverticulitis at age 60   Eczema    Fibromyalgia    GERD (gastroesophageal reflux disease)    takes Protonix daily   Hard of hearing     History of bronchitis as a child    History of kidney stones    History of migraine    last one 10+yrs ago   History of staph infection 60yr ago   Hyperlipidemia    takes Pravastatin daily   IBS (irritable bowel syndrome)    mixed   Insomnia    Joint pain    Joint swelling    Lactose intolerance    Nausea    takes Zofran daily as needed   PFO (patent foramen ovale)    Plaque psoriasis    Pneumonia 60yrago   hx of   PONV (postoperative nausea and vomiting)    Postoperative anemia due to acute blood loss 02/08/2016   Primary localized osteoarthritis of left knee    Primary localized osteoarthritis of right knee 11/03/2015   Rheumatoid arthritis(714.0) 2010   oa and ra;Rhemicade IV every 6wks and Metotrexate weekly   [place]   takes Depakote daily   Shortness of breath    with exertion   Sleep apnea    study done >60yr66yro;uses CPAP nightly   Spinal headache    patient states that she thinks she had a spinal headache a long time ago   Stress incontinence    Stroke (HCC)[place]/2014   left sided weakness   Thyroid cyst    Ventricular septal defect    2014 TEE  lists large right to left shunt ASD/PFO (NO VSD mentioned)   Past Surgical History:  Procedure Laterality Date   APPENDECTOMY  1981   BIOPSY  12/16/2019   Procedure: BIOPSY;  Surgeon: Juanita Craver, MD;  Location: [facility];  Service: Endoscopy;;   CARDIAC CATHETERIZATION  2004   CHOLECYSTECTOMY  1981   COLONOSCOPY     COLONOSCOPY WITH PROPOFOL N/A 12/16/2019   Procedure: COLONOSCOPY WITH PROPOFOL;  Surgeon: Juanita Craver, MD;  Location: [facility];  Service: Endoscopy;  Laterality: N/A;   ESOPHAGOGASTRODUODENOSCOPY     FOOT SURGERY  1999   right ankle   HERNIA REPAIR  2006   x2   INCISIONAL HERNIA REPAIR  05/20/2012   Procedure: HERNIA REPAIR INCISIONAL;  Surgeon: Joyice Faster. Cornett, MD;  Location: [facility];  Service: General;  Laterality: N/A;   INCONTINENCE SURGERY  2010   sling done     KIDNEY STONE SURGERY  2001   KNEE ARTHROSCOPY  1992   left   left foot plating and scarping for arthritis  2011   right ear tube insertion  2011   TEE WITHOUT CARDIOVERSION N/A 05/15/2013   Procedure: TRANSESOPHAGEAL ECHOCARDIOGRAM (TEE);  Surgeon: Laverda Page, MD;  Location: [facility];  Service: Cardiovascular;  Laterality: N/A;   thryoid biopsy     TONSILLECTOMY AND ADENOIDECTOMY  04/01/2014   TONSILLECTOMY AND ADENOIDECTOMY Bilateral 04/01/2014   Procedure: BILATERAL TONSILLECTOMY AND ADENOIDECTOMY;  Surgeon: Ascencion Dike, MD;  Location: [facility];  Service: ENT;  Laterality: Bilateral;   TOTAL ABDOMINAL HYSTERECTOMY  2001   TOTAL KNEE ARTHROPLASTY Right 11/03/2015   TOTAL KNEE ARTHROPLASTY Right 11/03/2015   Procedure: RIGHT TOTAL KNEE ARTHROPLASTY/RIGHT;  Surgeon: Elsie Saas, MD;  Location: [facility];  Service: Orthopedics;  Laterality: Right;   TOTAL KNEE ARTHROPLASTY Left 02/07/2016   Procedure: TOTAL KNEE ARTHROPLASTY;  Surgeon: Elsie Saas, MD;  Location: [place];  Service: Orthopedics;  Laterality: Left;   TUBAL LIGATION  1990   Social History   Tobacco Use   Smoking status: Former    Packs/day: 2.00    Years: 30.00    Pack years: 60.00    Types: Cigarettes    Quit date: 10/20/1999    Years since quitting: 21.6   Smokeless tobacco: Never   Tobacco comments:    quit smoking in 2002  Substance Use Topics   Alcohol use: Yes    Comment: occ    ROS  Review of Systems  Cardiovascular:  Positive for chest pain and dyspnea on exertion. Negative for leg swelling.  Respiratory:  Positive for snoring (not using CPAP lately) and wheezing.   Musculoskeletal:  Positive for back pain and joint pain.  Gastrointestinal:  Negative for melena.  Objective   Vitals with BMI 06/22/2021 08/18/2020 12/16/2019  Height 5' 4"  5' 4"  -  Weight 165 lbs 10 oz 145 lbs -  BMI 12.24 82.50 -  Systolic 037 048 889  Diastolic 78 84 53  Pulse 70 59 76    Blood pressure 131/78, pulse 70, temperature  98.2 F (36.8 C), temperature source Temporal, resp. rate 17, height 5' 4"  (1.626 m), weight 165 lb 9.6 oz (75.1 kg), SpO2 97 %. Body mass index is 28.43 kg/m.   Physical Exam Constitutional:      General: She is not in acute distress.    Appearance: Normal appearance. She is well-developed.  Neck:     Thyroid: No thyromegaly.     Vascular: No carotid bruit or JVD.  Cardiovascular:     Rate and Rhythm: Normal rate and regular rhythm.     Pulses: Normal pulses and intact distal pulses.     Heart sounds: Normal heart sounds. No murmur heard.   No gallop.  Pulmonary:     Effort: Pulmonary effort is normal.     Breath sounds: Normal breath sounds.  Abdominal:     General: Bowel sounds are normal.     Palpations: Abdomen is soft.  Musculoskeletal:        General: No swelling. Normal range of motion.     Cervical back: Neck supple.  Skin:    General: Skin is warm and dry.     Capillary Refill: Capillary refill takes less than 2 seconds.   Laboratory examination:   Labs 11/16/2020:  Sodium 142, potassium 4.4, BUN 14, creatinine 0.58, EGFR >60 mL.  A1c 4.1%.  Labs 08/17/2020:  Hb 13.5/HCT 40.1, platelets 170.  Normal indicis.  Serum glucose 92 mg, BUN 8, creatinine 0.58, EGFR >60 mL.  Sodium 146, potassium 3.7, CO2 18.  CMP otherwise normal.  Quantitative C-reactive protein elevated at 4.0.  External Lab 11/17/2019:  Sodium 142, potassium 3.4, BUN 9, creatinine 0.60, EGFR >60 mL.  CMP otherwise normal.  TSH normal.  Vitamin D 38.  Total cholesterol 191, triglycerides 232, HDL 40, LDL 117.  Hb 14.4/HCT 43.2, platelets 165.  Medications and allergies   Allergies  Allergen Reactions   Aquacel [Carboxymethylcellulose] Other (See Comments)    Blisters    Dextromethorphan Anaphylaxis   Nickel    Sulfonamide Derivatives Anaphylaxis, Hives and Swelling    REACTION: swelling, tongue and hives   Aspirin Nausea And Vomiting    REACTION: severe gi upset.  Pt states she  can tolerate 81 mg asa only.    Ciprofloxacin Itching and Swelling    REACTION: angioedema, urticaria   Cosyntropin Hives and Swelling    REACTION: swelling, hives   Adhesive [Tape] Other (See Comments)    Blisters skin   Latex Other (See Comments)    other   Amoxicillin Nausea And Vomiting    REACTION: GI upset   Guaifenesin Rash    REACTION: head rash   Moxifloxacin Other (See Comments)    gi upset   Nsaids Other (See Comments)    Gi upset    Current Outpatient Medications on File Prior to Visit  Medication Sig Dispense Refill   albuterol (VENTOLIN HFA) 108 (90 Base) MCG/ACT inhaler Inhale 1-2 puffs into the lungs every 6 (six) hours as needed for wheezing or shortness of breath.     alendronate (FOSAMAX) 70 MG tablet Take 1 tablet by mouth daily.     ALPRAZolam (XANAX) 0.5 MG tablet Take 1 tablet by mouth at bedtime.     amLODipine (NORVASC) 5 MG tablet Take 1 tablet (5 mg total) by mouth daily. 90 tablet 3   aspirin EC 81 MG tablet Take 81 mg by mouth daily.     Cimetidine-Lido-Salicylic Acid 58-0-99 % GEL Apply topically.     citalopram (CELEXA) 20 MG tablet TAKE 2 TABLETS BY MOUTH  DAILY (Patient taking differently: Take 20 mg by mouth in the morning and at bedtime.) 90 tablet 0   desvenlafaxine (PRISTIQ) 50 MG 24 hr tablet Take 50 mg by mouth daily.     dicyclomine (BENTYL) 10 MG capsule Take 10 mg by mouth 4 (four) times daily -  before meals and at bedtime.     divalproex (DEPAKOTE ER) 500 MG 24 hr  tablet Take 1 tablet by mouth 2 (two) times daily.     ipratropium (ATROVENT) 0.03 % nasal spray Place 1 spray into the nose in the morning and at bedtime.     leflunomide (ARAVA) 20 MG tablet Take 1 tablet by mouth at bedtime.     Lifitegrast 5 % SOLN Apply 1 drop to eye 2 (two) times daily.     loratadine (CLARITIN) 10 MG tablet Take 1 tablet by mouth daily.     mupirocin ointment (BACTROBAN) 2 % Apply 1 application topically in the morning, at noon, and at bedtime. Applied to  affected area of toe     nitroGLYCERIN (NITROSTAT) 0.4 MG SL tablet Place 1 tablet (0.4 mg total) under the tongue every 5 (five) minutes as needed for chest pain. 90 tablet 3   ondansetron (ZOFRAN-ODT) 4 MG disintegrating tablet Take 4 mg by mouth every 8 (eight) hours as needed.     Psyllium (QC NATURAL VEGETABLE) 95 % POWD Take 1 Scoop by mouth in the morning and at bedtime.     topiramate (TOPAMAX) 50 MG tablet Take 50 mg by mouth 2 (two) times daily.     traMADol (ULTRAM) 50 MG tablet Take 1 tablet by mouth as needed.     triamcinolone cream (KENALOG) 0.1 % Apply 1 application topically 2 (two) times daily as needed (skin irritation.).      valACYclovir (VALTREX) 1000 MG tablet Take 1,000 mg by mouth 3 (three) times daily.     No current facility-administered medications on file prior to visit.    Radiology:   Coronary CTA 11/13/2017: 1. Coronary calcium score of 19. This was 70 percentile for age and sex matched control. 2. Normal coronary origin with right dominance. 3. Minimal plaque in the left main, proximal and mid LAD. Aggressive risk factor modification is recommended. 4.  PFO is present. 5. Lungs clear.   Cardiac Studies:   Exercise myoview stress 09/03/2017: 1. The resting electrocardiogram demonstrated normal sinus rhythm, normal resting conduction and no resting arrhythmias. With peak exercise there was 1 mm upsloping ST depression which returned to baseline at 2 minutes into recovery, equivocal for ischemia. The patient performed treadmill exercise using a Bruce protocol, completing 6:30 minutes. The patient completed an estimated workload of 7.46 METS, achieved 88% of MPHR (THR 85% for age). Stress symptoms included dyspnea, dizziness, headache, chest pressure. The stress test was terminated because of fatigue. Hypertensive BP response. Resting blood pressure was 142/88 mmHg and peak blood pressure was 212/106 mmHg. 2. There is a small area of mild ischemia in the basal  inferoseptal and mid inferoseptal myocardial wall(s). The left ventricular ejection fraction was calculated or visually estimated to be 63%. This is a low risk study.  Echocardiogram  [08/27/2017]: Left ventricle cavity is normal in size. Normal global wall motion. LVEF is 55-60%. Normal diastolic filling pattern. Documented PFO on previous TEE. Interatrial septum not adequately visualized on this study. No significant valvular abnormality. No evidence of pulmonary hypertension.    EKG:    EKG 06/22/2021: Normal sinus rhythm at rate of 55 bpm, left atrial enlargement, normal axis.  Nonspecific T abnormality. No significant change from EKG normal sinus rhythm at rate of 47 bpm.   Assessment     ICD-10-CM   1. Atypical chest pain  R07.89 PCV MYOCARDIAL PERFUSION WO LEXISCAN    PCV ECHOCARDIOGRAM COMPLETE    2. Dyspnea on exertion  R06.00 PCV MYOCARDIAL PERFUSION WO LEXISCAN    PCV ECHOCARDIOGRAM COMPLETE  3. Essential hypertension  I10 EKG 12-Lead    4. Pure hypercholesterolemia  E78.00 pravastatin (PRAVACHOL) 20 MG tablet    DISCONTINUED: pravastatin (PRAVACHOL) 20 MG tablet    5. Research study patient: [facility]  impact of lipoprotein(a) lowering with pelacarsen on CV events  Z00.6      Meds ordered this encounter  Medications   DISCONTD: pravastatin (PRAVACHOL) 20 MG tablet    Sig: Take 1 tablet (20 mg total) by mouth at bedtime.    Dispense:  90 tablet    Refill:  3   pravastatin (PRAVACHOL) 20 MG tablet    Sig: Take 1 tablet (20 mg total) by mouth at bedtime.    Dispense:  90 tablet    Refill:  3    Recommendations:   [name]  is a 60 y.o. Caucasian female with history of embolic stroke on 03/21/44, randomized to medical arm in the "Reduce" secondary prevention of stroke trial with PFO. No recurrence of stroke since. Echocardiogram and stress test in Nov 2018 which was low to intermediate risk with abnormal EKG response and probably septal ischemia, but normal  LVEF. Coronary CTA in Jan 2019 revealed mild coronary atherosclerosis.   Her past medical history is significant for psoriatic arthritis, rheumatoid arthritis, history of nephrolithiasis, history of cerebrovascular AV malformations felt to be small and stroke felt to be cardioembolic in June 9977, sleep apnea on CPAP, COPD and has 40+ year history of smoking cigarettes and follows Dr. Baird Lyons.  She made an appointment to see me due to recurrent episodes of chest pain which are considered mild but more frequent with activity, described as tightness in the middle of the chest.  She has also noticed mild dyspnea on exertion and has also been having difficulty with CPAP.  Symptoms of chest pain appeared to be atypical at most but however in view of her significant cardiovascular risk factors and moderate coronary disease by CTA, we will schedule her for a repeat echocardiogram to evaluate dyspnea and a nuclear stress test to evaluate for myocardial ischemia. If the stress test and echocardiogram do not show significant abnormality, there are symptoms may be related to underlying COPD and poor compliance with CPAP due to difficulty with mask and possibly GERD.  Unless the tests are abnormal, I will see her back in a year.  She is on appropriate medical therapy for hypertension which is well controlled and also on pravastatin which she has not been able to tolerate at higher dose.   Patient in  research.  [facility] (protocol [id number]) Description: A randomized double-blind, placebo-controlled, multicenter trialassessing the impact of lipoprotein(a) lowering with pelacarsen ([id number]) on major cardiovascular events in patients with established cardiovascular disease.   Adrian Prows, MD, [facility] 06/22/2021, 9:49 PM Office: [phone] Pager: [phone]

## 2021-06-23 ENCOUNTER — Telehealth: Payer: Self-pay | Admitting: Internal Medicine

## 2021-06-23 NOTE — Telephone Encounter (Signed)
CY pt was last seen by you on 09/2016 and would like to come back and be seen by you.  Please advise. Thanks

## 2021-06-24 NOTE — Telephone Encounter (Signed)
I have called and LM on VM for the pt to call back to get this appt scheduled with CY.

## 2021-06-24 NOTE — Telephone Encounter (Signed)
Ok to use a held spot  

## 2021-07-01 NOTE — Progress Notes (Signed)
HPI F former 2ppd/ 60 pkyr smoker seen for OSA, dyspnea/ moderate asthma, complicated by hx CVA PFT 11/03/2013-mild obstructive airways disease with significant response to bronchodilator, normal lung volumes, mild diffusion defect. FVC 3.02/87%, FEV1 2.60/96%, FEV1/FVC 0.86, FEF 25-75% 4.50/174%, TLC 98%, DLCO 69%. Office Spirometry 10/05/2016-WNL-FVC 3.35/96%, FEV1 2.63/96%, ratio 0.78  -------------------------------------------   10/05/2016-60 year old female former 2 pack per day/(60 pack year) smoker seen for OSA, dyspnea/moderate asthma/ COPD complicated by history CVA CPAP 10/[facility] FOLLOWS FOR:[facility]; pt has not been using CPAP in months.  No SD card.  She stopped using CPAP at time of knee surgery. Husband tells her she doesn't sleep as well without it and she is ready to restart. Easy dyspnea on exertion Followed for VSD by cardiologist Dr.Ganji. CXR 10/05/2015-NAD Office Spirometry 10/05/2016-WNL-FVC 3.35/96%, FEV1 2.63/96%, ratio 0.78   07/04/21- 60 year old female coming to re-establish-former 2 pack per day/(60 pack year) smoker seen for OSA, dyspnea/moderate asthma/ COPD complicated by history CVA CPAP 10/[facility]  -Ventolin hfa -----Pt states need new cpap , watch sleep tracker is telling her she is not sleeping well. Remote tonsillectomy.  Husband pushes her for snoring. CPAP used to work better. Now old.  Pending stress test- Dr Jacinto Halim. Agrees upcate CXR, Flu vax. Discussed CPAP alternatives. CXR 08/27/17-  IMPRESSION: Normal examination, with the exception of aortic atherosclerosis.  ROS-see HPI Constitutional:   No-   weight loss, night sweats, fevers, chills, fatigue, lassitude. HEENT:   + headaches, No-difficulty swallowing, tooth/dental problems, sore throat,       No-  sneezing, itching, ear ache, nasal congestion, post nasal drip,  CV:  No-   chest pain, orthopnea, PND, swelling in lower extremities, anasarca,  dizziness,  palpitations Resp: +shortness of breath with exertion or at rest.              No-   productive cough,  No non-productive cough,  No- coughing up of blood.              No-   change in color of mucus.  +wheezing.   Skin: No-   rash or lesions. GI:  No-   heartburn, indigestion, abdominal pain, nausea, vomiting, GU: . MS:  +joint pain or swelling.  . Neuro-     ?seizures Psych:  No- change in mood or affect. No depression or anxiety.  No memory loss.  OBJ- Physical Exam General- Alert, Oriented, Affect-appropriate, Distress- none acute Skin- rash-none, lesions- none, excoriation- none Lymphadenopathy- none Head- atraumatic            Eyes- Gross vision intact, PERRLA, conjunctivae and secretions clear            Ears- gross hearing normal            Nose- Clear, no-Septal dev, mucus, polyps, erosion, perforation             Throat- Mallampati III-IV , mucosa clear , drainage- none, tonsils-absent,  Neck- flexible , trachea midline, no stridor , thyroid nl, carotid no bruit Chest - symmetrical excursion , unlabored           Heart/CV- RRR , no murmur heard (I don't hear PFO), no gallop  ,                            - JVD- none , edema- none, stasis changes- none, varices- none  Lung- clear to P&A, wheeze- none, cough- none , dullness-none, rub- none           Chest wall-  Abd-  Br/ Gen/ Rectal- Not done, not indicated Extrem- cyanosis- none, clubbing, none, atrophy- none, strength- nl Neuro- grossly intact to observation

## 2021-07-04 ENCOUNTER — Encounter: Payer: Self-pay | Admitting: Internal Medicine

## 2021-07-04 ENCOUNTER — Other Ambulatory Visit: Payer: Self-pay

## 2021-07-04 ENCOUNTER — Ambulatory Visit: Payer: Medicare Other | Admitting: Internal Medicine

## 2021-07-04 ENCOUNTER — Ambulatory Visit (INDEPENDENT_AMBULATORY_CARE_PROVIDER_SITE_OTHER): Payer: Medicare Other

## 2021-07-04 VITALS — BP 136/86 | HR 72 | Temp 98.1°F | Ht 64.0 in | Wt 168.6 lb

## 2021-07-04 DIAGNOSIS — Z23 Encounter for immunization: Secondary | ICD-10-CM

## 2021-07-04 DIAGNOSIS — J449 Chronic obstructive pulmonary disease, unspecified: Secondary | ICD-10-CM

## 2021-07-04 DIAGNOSIS — R0683 Snoring: Secondary | ICD-10-CM

## 2021-07-04 DIAGNOSIS — G4733 Obstructive sleep apnea (adult) (pediatric): Secondary | ICD-10-CM

## 2021-07-04 NOTE — Assessment & Plan Note (Signed)
Denies active symptoms. Agrees to update CXR and flu vax Plan CXR, flu vax

## 2021-07-04 NOTE — Patient Instructions (Signed)
Order- flu vax- standard  Order- CXR    dx COPD mixed type  Order- schedule home sleep test    dx snoring  Please call us for results and recommendations about 2 weeks after your sleep test

## 2021-07-04 NOTE — Assessment & Plan Note (Signed)
Needs update sleep study to document, then order replacement CPAP. Plan- home sleep test

## 2021-07-05 ENCOUNTER — Ambulatory Visit: Payer: Medicare Other

## 2021-07-05 DIAGNOSIS — R0609 Other forms of dyspnea: Secondary | ICD-10-CM

## 2021-07-05 DIAGNOSIS — R0600 Dyspnea, unspecified: Secondary | ICD-10-CM

## 2021-07-05 DIAGNOSIS — R0789 Other chest pain: Secondary | ICD-10-CM

## 2021-07-06 ENCOUNTER — Telehealth: Payer: Self-pay | Admitting: Internal Medicine

## 2021-07-06 NOTE — Progress Notes (Signed)
I left a message for the patient to call back for results.  

## 2021-07-06 NOTE — Telephone Encounter (Signed)
CXR- heart and lungs look ok. No concerns.  I have called the pt and she is aware of results

## 2021-07-14 NOTE — Telephone Encounter (Signed)
Pt seen on 07/04/21 by CY. Will close encounter.

## 2021-08-12 ENCOUNTER — Other Ambulatory Visit: Payer: Self-pay | Admitting: Cardiology

## 2021-08-12 DIAGNOSIS — I10 Essential (primary) hypertension: Secondary | ICD-10-CM

## 2021-08-18 ENCOUNTER — Other Ambulatory Visit: Payer: Self-pay

## 2021-08-18 ENCOUNTER — Encounter: Payer: Self-pay | Admitting: Cardiology

## 2021-08-18 ENCOUNTER — Ambulatory Visit: Payer: Medicare Other | Admitting: Cardiology

## 2021-08-18 VITALS — BP 124/89 | HR 60 | Temp 98.0°F | Resp 16 | Ht 64.0 in | Wt 169.0 lb

## 2021-08-18 DIAGNOSIS — E7800 Pure hypercholesterolemia, unspecified: Secondary | ICD-10-CM

## 2021-08-18 DIAGNOSIS — I10 Essential (primary) hypertension: Secondary | ICD-10-CM

## 2021-08-18 DIAGNOSIS — Z006 Encounter for examination for normal comparison and control in clinical research program: Secondary | ICD-10-CM

## 2021-08-18 DIAGNOSIS — E782 Mixed hyperlipidemia: Secondary | ICD-10-CM

## 2021-08-18 NOTE — Progress Notes (Signed)
Primary Physician/Referring:  Christain Sacramento, MD  Patient ID: [name], female    DOB: 1961/05/18, 60 y.o.   MRN: [id number]  Chief Complaint  Patient presents with   Cerebrovascular Accident   Hyperlipidemia   Follow-up    1 year   Hypertension    HPI:    [name]  is a 59 y.o. Caucasian female with history of embolic stroke on 1/91/6606, randomized to medical arm in the "Reduce" secondary prevention of stroke trial with PFO. No recurrence of stroke since.   Her past medical history is significant for psoriatic arthritis, rheumatoid arthritis, history of nephrolithiasis, history of cerebrovascular AV malformations felt to be small and stroke felt to be cardioembolic in June 0045, sleep apnea on CPAP, she is now being reset for repeat study, COPD and has 40+ year history of smoking cigarettes and follows Dr. Baird Lyons.  I had seen her about 6 weeks ago for chest pain and dyspnea on exertion, she underwent echocardiogram and stress test and presents for follow-up.  She has not had any further chest pain but has had occasional episodes of brief palpitations.  Dyspnea on exertion has remained stable.  Past Medical History:  Diagnosis Date   Allergic rhinitis    uses Flonase daily as needed and takes CLaritin daily   Anxiety    takes Xanax daily as needed   Asthma    Albuterol inhaler prn;SYmbicort daily   Cerebral vascular malformation    sees dr Arnoldo Morale for monitoring as needed, sees dr lewitt for headaches every 4 months   COPD (chronic obstructive pulmonary disease) ([facility])    Depression    takes Citalopram daily   Diverticulitis at age 4   Eczema    Fibromyalgia    GERD (gastroesophageal reflux disease)    takes Protonix daily   Hard of hearing    History of bronchitis as a child    History of kidney stones    History of migraine    last one 10+yrs ago   History of staph infection 38yr ago   Hyperlipidemia    takes Pravastatin daily   IBS (irritable  bowel syndrome)    mixed   Insomnia    Joint pain    Joint swelling    Lactose intolerance    Nausea    takes Zofran daily as needed   PFO (patent foramen ovale)    Plaque psoriasis    Pneumonia 268yrago   hx of   PONV (postoperative nausea and vomiting)    Postoperative anemia due to acute blood loss 02/08/2016   Primary localized osteoarthritis of left knee    Primary localized osteoarthritis of right knee 11/03/2015   Rheumatoid arthritis(714.0) 2010   oa and ra;Rhemicade IV every 6wks and Metotrexate weekly   Seizures ([id number] 03/21/14   takes Depakote daily   Shortness of breath    with exertion   Sleep apnea    study done >[id number];uses CPAP nightly   Spinal headache    patient states that she thinks she had a spinal headache a long time ago   Stress incontinence    Stroke (HCC)[place]11/2014   left sided weakness   Thyroid cyst    Ventricular septal defect    2014 TEE lists large right to left shunt ASD/PFO (NO VSD mentioned)   Past Surgical History:  Procedure Laterality Date   APPENDECTOMY  1981   BIOPSY  12/16/2019   Procedure: BIOPSY;  Surgeon: Juanita Craver, MD;  Location: [facility];  Service: Endoscopy;;   CARDIAC CATHETERIZATION  2004   CHOLECYSTECTOMY  1981   COLONOSCOPY     COLONOSCOPY WITH PROPOFOL N/A 12/16/2019   Procedure: COLONOSCOPY WITH PROPOFOL;  Surgeon: Juanita Craver, MD;  Location: [facility];  Service: Endoscopy;  Laterality: N/A;   ESOPHAGOGASTRODUODENOSCOPY     FOOT SURGERY  1999   right ankle   HERNIA REPAIR  2006   x2   INCISIONAL HERNIA REPAIR  05/20/2012   Procedure: HERNIA REPAIR INCISIONAL;  Surgeon: Joyice Faster. Cornett, MD;  Location: [facility];  Service: General;  Laterality: N/A;   INCONTINENCE SURGERY  2010   sling done    KIDNEY STONE SURGERY  2001   KNEE ARTHROSCOPY  1992   left   left foot plating and scarping for arthritis  2011   right ear tube insertion  2011   TEE WITHOUT CARDIOVERSION N/A 05/15/2013   Procedure:  TRANSESOPHAGEAL ECHOCARDIOGRAM (TEE);  Surgeon: Laverda Page, MD;  Location: [facility];  Service: Cardiovascular;  Laterality: N/A;   thryoid biopsy     TONSILLECTOMY AND ADENOIDECTOMY  04/01/2014   TONSILLECTOMY AND ADENOIDECTOMY Bilateral 04/01/2014   Procedure: BILATERAL TONSILLECTOMY AND ADENOIDECTOMY;  Surgeon: Ascencion Dike, MD;  Location: [facility];  Service: ENT;  Laterality: Bilateral;   TOTAL ABDOMINAL HYSTERECTOMY  2001   TOTAL KNEE ARTHROPLASTY Right 11/03/2015   TOTAL KNEE ARTHROPLASTY Right 11/03/2015   Procedure: RIGHT TOTAL KNEE ARTHROPLASTY/RIGHT;  Surgeon: Elsie Saas, MD;  Location: [facility];  Service: Orthopedics;  Laterality: Right;   TOTAL KNEE ARTHROPLASTY Left 02/07/2016   Procedure: TOTAL KNEE ARTHROPLASTY;  Surgeon: Elsie Saas, MD;  Location: [facility];  Service: Orthopedics;  Laterality: Left;   TUBAL LIGATION  1990   Social History   Tobacco Use   Smoking status: Former    Packs/day: 2.00    Years: 30.00    Pack years: 60.00    Types: Cigarettes    Quit date: 10/20/1999    Years since quitting: 21.8   Smokeless tobacco: Never   Tobacco comments:    quit smoking in 2002  Substance Use Topics   Alcohol use: Yes    Comment: occ    ROS  Review of Systems  Cardiovascular:  Positive for dyspnea on exertion and palpitations. Negative for chest pain and leg swelling.  Respiratory:  Positive for snoring (not using CPAP but scheduled for recheck) and wheezing.   Musculoskeletal:  Positive for back pain and joint pain.  Gastrointestinal:  Negative for melena.  Objective   Vitals with BMI 08/18/2021 07/04/2021 06/22/2021  Height 5' 4"  5' 4"  5' 4"   Weight 169 lbs 168 lbs 10 oz 165 lbs 10 oz  BMI 28.99 39.76 73.41  Systolic 937 902 409  Diastolic 89 86 78  Pulse 60 72 70    Blood pressure 124/89, pulse 60, temperature 98 F (36.7 C), resp. rate 16, height 5' 4"  (1.626 m), weight 169 lb (76.7 kg), SpO2 97 %. Body mass index is 29.01 kg/m.   Physical  Exam Constitutional:      General: She is in acute distress.     Appearance: She is well-developed.  Neck:     Thyroid: No thyromegaly.     Vascular: No carotid bruit or JVD.  Cardiovascular:     Rate and Rhythm: Normal rate and regular rhythm.     Pulses: Normal pulses and intact distal pulses.     Heart sounds: Normal heart  sounds. No murmur heard.   No gallop.  Pulmonary:     Effort: Pulmonary effort is normal.     Breath sounds: Normal breath sounds.  Abdominal:     General: Bowel sounds are normal.     Palpations: Abdomen is soft.  Musculoskeletal:     Right lower leg: No edema.     Left lower leg: No edema.   Laboratory examination:   Labs 06/14/2021:  Hb 14.3/HCT 43.9, platelets 177.  Serum glucose 96 mg, BUN 13, creatinine 0.59, potassium 4.1, EGFR >60 mL.  Labs 11/16/2020:  Sodium 142, potassium 4.4, BUN 14, creatinine 0.58, EGFR >60 mL.  A1c 4.1%.  Labs 08/17/2020:  Hb 13.5/HCT 40.1, platelets 170.  Normal indicis.  Serum glucose 92 mg, BUN 8, creatinine 0.58, EGFR >60 mL.  Sodium 146, potassium 3.7, CO2 18.  CMP otherwise normal.  Quantitative C-reactive protein elevated at 4.0.  External Lab 11/17/2019:  Sodium 142, potassium 3.4, BUN 9, creatinine 0.60, EGFR >60 mL.  CMP otherwise normal.  TSH normal.  Vitamin D 38.  Total cholesterol 191, triglycerides 232, HDL 40, LDL 117.  Hb 14.4/HCT 43.2, platelets 165.  Medications and allergies   Allergies  Allergen Reactions   Aquacel [Carboxymethylcellulose] Other (See Comments)    Blisters    Dextromethorphan Anaphylaxis   Nickel    Sulfonamide Derivatives Anaphylaxis, Hives and Swelling    REACTION: swelling, tongue and hives   Aspirin Nausea And Vomiting    REACTION: severe gi upset.  Pt states she can tolerate 81 mg asa only.    Ciprofloxacin Itching and Swelling    REACTION: angioedema, urticaria   Cosyntropin Hives and Swelling    REACTION: swelling, hives   Adhesive [Tape] Other (See  Comments)    Blisters skin   Latex Other (See Comments)    other   Amoxicillin Nausea And Vomiting    REACTION: GI upset   Guaifenesin Rash    REACTION: head rash   Moxifloxacin Other (See Comments)    gi upset   Nsaids Other (See Comments)    Gi upset    Current Outpatient Medications on File Prior to Visit  Medication Sig Dispense Refill   albuterol (VENTOLIN HFA) 108 (90 Base) MCG/ACT inhaler Inhale 1-2 puffs into the lungs every 6 (six) hours as needed for wheezing or shortness of breath.     alendronate (FOSAMAX) 70 MG tablet Take 1 tablet by mouth daily.     ALPRAZolam (XANAX) 0.5 MG tablet Take 1 tablet by mouth at bedtime.     amLODipine (NORVASC) 5 MG tablet TAKE 1 TABLET BY MOUTH  DAILY 90 tablet 3   aspirin EC 81 MG tablet Take 81 mg by mouth daily.     Cimetidine-Lido-Salicylic Acid 08-24-40 % GEL Apply topically.     desvenlafaxine (PRISTIQ) 50 MG 24 hr tablet Take 50 mg by mouth daily.     dicyclomine (BENTYL) 10 MG capsule Take 10 mg by mouth 4 (four) times daily -  before meals and at bedtime.     divalproex (DEPAKOTE ER) 500 MG 24 hr tablet Take 1 tablet by mouth 2 (two) times daily.     ipratropium (ATROVENT) 0.03 % nasal spray Place 1 spray into the nose in the morning and at bedtime.     leflunomide (ARAVA) 20 MG tablet Take 1 tablet by mouth at bedtime.     loratadine (CLARITIN) 10 MG tablet Take 1 tablet by mouth daily.     mupirocin ointment (BACTROBAN)  2 % Apply 1 application topically in the morning, at noon, and at bedtime. Applied to affected area of toe     nitroGLYCERIN (NITROSTAT) 0.4 MG SL tablet Place 1 tablet (0.4 mg total) under the tongue every 5 (five) minutes as needed for chest pain. 90 tablet 3   ondansetron (ZOFRAN-ODT) 4 MG disintegrating tablet Take 4 mg by mouth every 8 (eight) hours as needed.     pravastatin (PRAVACHOL) 20 MG tablet Take 1 tablet (20 mg total) by mouth at bedtime. 90 tablet 3   Psyllium (QC NATURAL VEGETABLE) 95 % POWD Take 1  Scoop by mouth in the morning and at bedtime.     topiramate (TOPAMAX) 50 MG tablet Take 50 mg by mouth 2 (two) times daily.     traMADol (ULTRAM) 50 MG tablet Take 1 tablet by mouth as needed.     triamcinolone cream (KENALOG) 0.1 % Apply 1 application topically 2 (two) times daily as needed (skin irritation.).      valACYclovir (VALTREX) 1000 MG tablet Take 1,000 mg by mouth 3 (three) times daily.     No current facility-administered medications on file prior to visit.    Radiology:   Coronary CTA 11/13/2017: 1. Coronary calcium score of 19. This was 19 percentile for age and sex matched control. 2. Normal coronary origin with right dominance. 3. Minimal plaque in the left main, proximal and mid LAD. Aggressive risk factor modification is recommended. 4.  PFO is present. 5. Lungs clear.   Cardiac Studies:   PCV MYOCARDIAL PERFUSION WO LEXISCAN 07/05/2021  Narrative Exercise Myoview stress test 07/05/2021: 1 Day Rest/Stress Protocol. Exercise time 4 minutes 30 seconds on Bruce protocol, achieved 6.43 METS, 91% APMHR. Stress ECG negative for ischemia. Normal myocardial perfusion without convincing evidence of reversible myocardial ischemia or prior infarct. Left ventricular size is normal, thickness preserved, no regional wall motion abnormalities, calculated LVEF 64%. Low risk study. Compared to 09/03/2017, mild basal inferoseptal and mid inferoseptal ischemia is no longer present.   PCV ECHOCARDIOGRAM COMPLETE 07/05/2021  Narrative Echocardiogram 07/05/2021: Normal LV systolic function with visual EF 55-60%. Left ventricle cavity is normal in size. Normal left ventricular wall thickness. Normal global wall motion. Normal diastolic filling pattern, normal LAP. Trace tricuspid regurgitation. No evidence of pulmonary hypertension. Mild pulmonic regurgitation. Compared to study 08/27/2017 no significant change. Documented PFO on prior TEE not well visualized on current study.      EKG:    EKG 06/22/2021: Normal sinus rhythm at rate of 55 bpm, left atrial enlargement, normal axis.  Nonspecific T abnormality. No significant change from EKG normal sinus rhythm at rate of 47 bpm.   Assessment     ICD-10-CM   1. Essential hypertension  I10 EKG 12-Lead    2. Pure hypercholesterolemia  E78.00     3. Enrolled in clinical trial of drug 02/26/20: Horizon   Z00.6     Drug: TQJ230 (Pelacarsen)  Description: A randomized double-blind, placebo-controlled, multicenter trial assessing    4. Mixed hyperlipidemia  E78.2      No orders of the defined types were placed in this encounter.   Recommendations:   [name]  is a 60 y.o. Caucasian female with history of embolic stroke on 05/26/9146, randomized to medical arm in the "Reduce" secondary prevention of stroke trial with PFO. No recurrence of stroke since.   Her past medical history is significant for psoriatic arthritis, rheumatoid arthritis, history of nephrolithiasis, history of cerebrovascular AV malformations felt to be  small and stroke felt to be cardioembolic in June 0223, sleep apnea on CPAP but recently having difficulty with this and has been scheduled for repeat study, COPD and has 40+ year history of smoking cigarettes and follows Dr. Baird Lyons.  She is now enrolled in clinical trial with Horizon.  She is tolerating the study medications well.  Lipids are not checked.  Blood pressure is well controlled, she has not had any further chest pain, she has occasional episodes of palpitations that are brief and few.  Dyspnea on exertion has remained stable.  She is on appropriate medical therapy and she is on a statin.  I reviewed her stress test and echocardiogram and reassured her.  She will continue to be in the clinical trial and I will see her back on a as needed basis.   Patient in  research.  Horizon (protocol [id number]) Description: A randomized double-blind, placebo-controlled, multicenter  trialassessing the impact of lipoprotein(a) lowering with pelacarsen (TQJ230) on major cardiovascular events in patients with established cardiovascular disease.   Adrian Prows, MD, [facility], [place] 08/18/2021, 1:44 PM Office: [phone] Pager: [phone]

## 2021-09-12 ENCOUNTER — Ambulatory Visit: Payer: Medicare Other

## 2021-09-12 ENCOUNTER — Other Ambulatory Visit: Payer: Self-pay

## 2021-09-12 DIAGNOSIS — R0683 Snoring: Secondary | ICD-10-CM

## 2021-09-19 ENCOUNTER — Other Ambulatory Visit: Payer: Self-pay

## 2021-09-19 ENCOUNTER — Ambulatory Visit: Payer: Self-pay | Admitting: Cardiology

## 2021-09-19 DIAGNOSIS — Z006 Encounter for examination for normal comparison and control in clinical research program: Secondary | ICD-10-CM

## 2021-09-19 NOTE — Progress Notes (Signed)
ICD-10-CM   1. Research study patient: Horizon  impact of lipoprotein(a) lowering with pelacarsen on CV events  Z00.6       Gwendolyn Decamp, MD, [facility] 09/19/2021, 2:11 PM Office: [phone] Fax: [phone] Pager: [phone]

## 2021-09-23 DIAGNOSIS — G4733 Obstructive sleep apnea (adult) (pediatric): Secondary | ICD-10-CM | POA: Diagnosis not present

## 2021-11-02 NOTE — Progress Notes (Signed)
HPI F former 2ppd/ 60 pkyr smoker seen for OSA, dyspnea/ moderate asthma, complicated by hx CVA PFT 11/03/2013-mild obstructive airways disease with significant response to bronchodilator, normal lung volumes, mild diffusion defect. FVC 3.02/87%, FEV1 2.60/96%, FEV1/FVC 0.86, FEF 25-75% 4.50/174%, TLC 98%, DLCO 69%. Office Spirometry 10/05/2016-WNL-FVC 3.35/96%, FEV1 2.63/96%, ratio 0.78  HST 09/13/21- AHI 5.6/ hr, desaturation to 88%, body weight 168 lbs -------------------------------------------   07/04/21- 61 year old female coming to re-establish-former 2 pack per day/(60 pack year) smoker seen for OSA, dyspnea/moderate asthma/ COPD complicated by history CVA CPAP 10/[facility]  -Ventolin hfa -----Pt states need new cpap , watch sleep tracker is telling her she is not sleeping well. Remote tonsillectomy.  Husband pushes her for snoring. CPAP used to work better. Now old.  Pending stress test- Dr Jacinto Halim. Agrees upcate CXR, Flu vax. Discussed CPAP alternatives. CXR 08/27/17-  IMPRESSION: Normal examination, with the exception of aortic atherosclerosis.  11/03/21-  61 year old female coming to re-establish-former 2 pack per day/(60 pack year) smoker seen for OSA, dyspnea/moderate asthma/ COPD complicated by history CVA/ PFO, Migraine, IBS, GERD, Complex Partial Epilepsy, Rheumatoid Arthritis, Anxiety/Depression, Hyperlipidemia,  CPAP 10/[facility]  -Ventolin hfa, Atrovent nasal, Claritin, Xanax, Nebulizer HST 09/13/21- AHI 5.6/ hr, desaturation to 88%, body weight 168 lbs Covid vax-none Flu vax-had -----Patient states that she has not been sleeping lately and normally has a hard time sleeping. States that she thinks she needs a CPAP. She has a broken machine so will need a new one. Feels like her breathing is good. She misses her CPAP.  We discussed mild OSA and alternative treatments. Otherwise breathing is "fine".  She has a Ventolin inhaler if needed but rarely used.  Little  wheeze or cough. History of PFO on maintenance aspirin from Dr. Gerhard Munch. CXR 07/04/21- IMPRESSION: No active cardiopulmonary disease.  ROS-see HPI Constitutional:   No-   weight loss, night sweats, fevers, chills, fatigue, lassitude. HEENT:   + headaches, No-difficulty swallowing, tooth/dental problems, sore throat,       No-  sneezing, itching, ear ache, nasal congestion, post nasal drip,  CV:  No-   chest pain, orthopnea, PND, swelling in lower extremities, anasarca,  dizziness, palpitations Resp: +shortness of breath with exertion or at rest.              No-   productive cough,  No non-productive cough,  No- coughing up of blood.              No-   change in color of mucus.  +wheezing.   Skin: No-   rash or lesions. GI:  No-   heartburn, indigestion, abdominal pain, nausea, vomiting, GU: . MS:  +joint pain or swelling.  . Neuro-     ?seizures Psych:  No- change in mood or affect. No depression or anxiety.  No memory loss.  OBJ- Physical Exam General- Alert, Oriented, Affect-appropriate, Distress- none acute Skin- rash-none, lesions- none, excoriation- none Lymphadenopathy- none Head- atraumatic            Eyes- Gross vision intact, PERRLA, conjunctivae and secretions clear            Ears- gross hearing normal            Nose- Clear, no-Septal dev, mucus, polyps, erosion, perforation             Throat- Mallampati III-IV , mucosa clear , drainage- none, tonsils-absent,  Neck- flexible , trachea midline, no stridor , thyroid nl, carotid no bruit Chest -  symmetrical excursion , unlabored           Heart/CV- RRR , no murmur heard (I don't hear PFO), no gallop  ,                            - JVD- none , edema- none, stasis changes- none, varices- none           Lung- clear to P&A, wheeze- none, cough- none , dullness-none, rub- none           Chest wall-  Abd-  Br/ Gen/ Rectal- Not done, not indicated Extrem- cyanosis- none, clubbing, none, atrophy- none, strength-  nl Neuro- grossly intact to observation

## 2021-11-03 ENCOUNTER — Other Ambulatory Visit: Payer: Self-pay

## 2021-11-03 ENCOUNTER — Encounter: Payer: Self-pay | Admitting: Internal Medicine

## 2021-11-03 ENCOUNTER — Ambulatory Visit: Payer: Medicare Other | Admitting: Internal Medicine

## 2021-11-03 DIAGNOSIS — F5101 Primary insomnia: Secondary | ICD-10-CM | POA: Diagnosis not present

## 2021-11-03 DIAGNOSIS — G4733 Obstructive sleep apnea (adult) (pediatric): Secondary | ICD-10-CM | POA: Diagnosis not present

## 2021-11-03 MED ORDER — ESZOPICLONE 2 MG PO TABS: 2.0000 mg | ORAL_TABLET | Freq: Every evening | ORAL | 5 refills | Status: DC | PRN

## 2021-11-03 NOTE — Patient Instructions (Signed)
Order- DME used to be [facility]. Can find new company if necessary. Replace old CPAP machine, auto 5-15, mask of choice, humidifier, supplies, AirView/ card.  Script sent to try lunesta (ezsopiclone) for sleep    1 at bedtime as needed  Please call if we can help

## 2021-11-18 ENCOUNTER — Encounter: Payer: Self-pay | Admitting: Internal Medicine

## 2021-11-18 DIAGNOSIS — G4733 Obstructive sleep apnea (adult) (pediatric): Secondary | ICD-10-CM

## 2021-11-23 DIAGNOSIS — G4700 Insomnia, unspecified: Secondary | ICD-10-CM | POA: Insufficient documentation

## 2021-11-23 NOTE — Assessment & Plan Note (Signed)
Discussed sleep habits.  Consider trazodone but there are drug interactions. Plan-try [name]

## 2021-11-23 NOTE — Assessment & Plan Note (Signed)
She felt better and slept better with CPAP. Plan-resume CPAP auto 5-15

## 2021-12-08 ENCOUNTER — Encounter: Payer: Self-pay | Admitting: Internal Medicine

## 2021-12-14 ENCOUNTER — Ambulatory Visit: Payer: Self-pay | Admitting: Cardiology

## 2021-12-14 ENCOUNTER — Other Ambulatory Visit: Payer: Self-pay

## 2021-12-14 DIAGNOSIS — Z006 Encounter for examination for normal comparison and control in clinical research program: Secondary | ICD-10-CM

## 2021-12-14 NOTE — Progress Notes (Signed)
ICD-10-CM   ?1. Research study patient: Horizon  impact of lipoprotein(a) lowering with pelacarsen on CV events  Z00.6   ?  ? ? ?

## 2022-01-16 ENCOUNTER — Encounter: Payer: Medicare Other | Admitting: Cardiology

## 2022-03-06 ENCOUNTER — Ambulatory Visit: Payer: Medicare Other | Admitting: Internal Medicine

## 2022-03-15 ENCOUNTER — Encounter: Payer: Medicare Other | Admitting: Cardiology

## 2022-03-19 NOTE — Progress Notes (Signed)
HPI F former 2ppd/ 60 pkyr smoker seen for OSA, dyspnea/ moderate asthma, complicated by hx CVA PFT 11/03/2013-mild obstructive airways disease with significant response to bronchodilator, normal lung volumes, mild diffusion defect. FVC 3.02/87%, FEV1 2.60/96%, FEV1/FVC 0.86, FEF 25-75% 4.50/174%, TLC 98%, DLCO 69%. Office Spirometry 10/05/2016-WNL-FVC 3.35/96%, FEV1 2.63/96%, ratio 0.78  HST 09/13/21- AHI 5.6/ hr, desaturation to 88%, body weight 168 lbs -------------------------------------------   11/03/21-  61 year old female coming to re-establish-former 2 pack per day/(60 pack year) smoker seen for OSA, dyspnea/moderate asthma/ COPD complicated by history CVA/ PFO, Migraine, IBS, GERD, Complex Partial Epilepsy, Rheumatoid Arthritis, Anxiety/Depression, Hyperlipidemia,  CPAP 10/[facility]  -Ventolin hfa, Atrovent nasal, Claritin, Xanax, Nebulizer HST 09/13/21- AHI 5.6/ hr, desaturation to 88%, body weight 168 lbs Covid vax-none Flu vax-had -----Patient states that she has not been sleeping lately and normally has a hard time sleeping. States that she thinks she needs a CPAP. She has a broken machine so will need a new one. Feels like her breathing is good. She misses her CPAP.  We discussed mild OSA and alternative treatments. Otherwise breathing is "fine".  She has a Ventolin inhaler if needed but rarely used.  Little wheeze or cough. History of PFO on maintenance aspirin from Dr. Gerhard Munch. CXR 07/04/21- IMPRESSION: No active cardiopulmonary disease.  03/20/22-  61 year old female followed for  -former 2 pack per day/(60 pack year) smoker,  OSA, dyspnea/moderate asthma/ COPD complicated by history CVA/ PFO, Migraine, IBS, GERD, Complex Partial Epilepsy, Rheumatoid Arthritis, Anxiety/Depression, Hyperlipidemia,  -Ventolin hfa, Atrovent nasal, Claritin, Xanax, Nebulizer, Lunesta 2 CPAP 5-15/ DME  Download-compliance 53%, AHI 1.1/ hr Body weight today-182 lbs Covid vax-none Flu  vax- had CPAP replaced in Feb. Admits falling asleep with TV before putting mask on. FFM irritates her[place]. Bothersome spring pollen, rain/ humid blamed for some cough and wheeze. No major exacerbation. Uses rescue 2-3x/ week. CXR 07/04/21-  FINDINGS: The heart size and mediastinal contours are within normal limits. Both lungs are clear. The visualized skeletal structures are unremarkable. IMPRESSION: No active cardiopulmonary disease.   ROS-see HPI Constitutional:   No-   weight loss, night sweats, fevers, chills, fatigue, lassitude. HEENT:   + headaches, No-difficulty swallowing, tooth/dental problems, sore throat,       No-  sneezing, itching, ear ache, nasal congestion, post nasal drip,  CV:  No-   chest pain, orthopnea, PND, swelling in lower extremities, anasarca,  dizziness, palpitations Resp: +shortness of breath with exertion or at rest.              No-   productive cough,  No non-productive cough,  No- coughing up of blood.              No-   change in color of mucus.  +wheezing.   Skin: No-   rash or lesions. GI:  No-   heartburn, indigestion, abdominal pain, nausea, vomiting, GU: . MS:  +joint pain or swelling.  . Neuro-     ?seizures Psych:  No- change in mood or affect. No depression or anxiety.  No memory loss.  OBJ- Physical Exam General- Alert, Oriented, Affect-appropriate, Distress- none acute, + overweight Skin- rash-none, lesions- none, excoriation- none Lymphadenopathy- none Head- atraumatic            Eyes- Gross vision intact, PERRLA, conjunctivae and secretions clear            Ears- gross hearing normal  Nose- Clear, no-Septal dev, mucus, polyps, erosion, perforation             Throat- Mallampati III-IV , mucosa clear , drainage- none, tonsils-absent,  Neck- flexible , trachea midline, no stridor , thyroid nl, carotid no bruit Chest - symmetrical excursion , unlabored           Heart/CV- RRR , no murmur heard (I don't hear  PFO), no gallop  ,                            - JVD- none , edema- none, stasis changes- none, varices- none           Lung- clear to P&A, wheeze- none, cough- none , dullness-none, rub- none           Chest wall-  Abd-  Br/ Gen/ Rectal- Not done, not indicated Extrem- cyanosis- none, clubbing, none, atrophy- none, strength- nl Neuro- grossly intact to observation

## 2022-03-20 ENCOUNTER — Ambulatory Visit: Payer: Medicare Other | Admitting: Internal Medicine

## 2022-03-20 ENCOUNTER — Encounter: Payer: Self-pay | Admitting: Internal Medicine

## 2022-03-20 VITALS — BP 128/88 | HR 68 | Temp 97.6°F | Ht 64.0 in | Wt 182.2 lb

## 2022-03-20 DIAGNOSIS — J4540 Moderate persistent asthma, uncomplicated: Secondary | ICD-10-CM | POA: Diagnosis not present

## 2022-03-20 DIAGNOSIS — G4733 Obstructive sleep apnea (adult) (pediatric): Secondary | ICD-10-CM | POA: Diagnosis not present

## 2022-03-20 MED ORDER — SPIRIVA RESPIMAT 2.5 MCG/ACT IN AERS: 2.0000 | INHALATION_SPRAY | Freq: Every day | RESPIRATORY_TRACT | 0 refills | Status: DC

## 2022-03-20 NOTE — Patient Instructions (Addendum)
Order- DME [facility]    please reduce CPAP auto range to 5-12, continue mask of choice, humidifier, supplies, AirView/ card  You can try ear plugs to help with snoring  Order sample Spiriva 2.5    inhale 2 puffs, once daily- maintenance  Please call if we can help

## 2022-04-24 ENCOUNTER — Encounter: Payer: Self-pay | Admitting: Internal Medicine

## 2022-04-24 NOTE — Assessment & Plan Note (Signed)
We discussed options anr are going to try adding Spiriva

## 2022-04-24 NOTE — Assessment & Plan Note (Signed)
Benefits from CPAP, but we need to improve compliance Plan- reduce autopap range to 5-12

## 2022-05-25 ENCOUNTER — Encounter: Payer: Self-pay | Admitting: Internal Medicine

## 2022-05-26 ENCOUNTER — Other Ambulatory Visit (HOSPITAL_COMMUNITY): Payer: Self-pay

## 2022-05-26 MED ORDER — SPIRIVA RESPIMAT 2.5 MCG/ACT IN AERS: 2.0000 | INHALATION_SPRAY | Freq: Every day | RESPIRATORY_TRACT | 5 refills | Status: DC

## 2022-05-26 NOTE — Telephone Encounter (Signed)
Mychart message sent by pt: [name]  P [facility] (supporting Waymon Budge, MD) 17 hours ago (4:01 PM)    Dr. Maple Hudson  My insurance company will not cover Spiriva. Is there anything else you can give my that doesn't cost so much ? Thank you so much!  Mila Pair [phone]    Routing to prior auth team to see what inhalers are covered by pt's insurance.

## 2022-06-20 ENCOUNTER — Ambulatory Visit: Payer: Medicare Other | Admitting: Cardiology

## 2022-06-22 ENCOUNTER — Ambulatory Visit: Payer: Medicare Other | Admitting: Cardiology

## 2022-07-03 ENCOUNTER — Ambulatory Visit: Payer: Medicare Other | Admitting: Cardiology

## 2022-07-03 ENCOUNTER — Encounter: Payer: Self-pay | Admitting: Cardiology

## 2022-07-03 VITALS — BP 107/69 | HR 70 | Temp 98.1°F | Resp 16 | Ht 64.0 in | Wt 180.4 lb

## 2022-07-03 DIAGNOSIS — R002 Palpitations: Secondary | ICD-10-CM

## 2022-07-03 DIAGNOSIS — E7800 Pure hypercholesterolemia, unspecified: Secondary | ICD-10-CM

## 2022-07-03 DIAGNOSIS — Z006 Encounter for examination for normal comparison and control in clinical research program: Secondary | ICD-10-CM

## 2022-07-03 DIAGNOSIS — I209 Angina pectoris, unspecified: Secondary | ICD-10-CM

## 2022-07-03 DIAGNOSIS — R0609 Other forms of dyspnea: Secondary | ICD-10-CM

## 2022-07-03 NOTE — Progress Notes (Unsigned)
Primary Physician/Referring:  Christain Sacramento, MD  Patient ID: Brand Males, female    DOB: Mar 02, 1961, 61 y.o.   MRN: [id number]  Chief Complaint  Patient presents with   Hyperlipidemia   CVA   Chest Pain    HPI:    [name]  is a 61 y.o. Caucasian female with history of embolic stroke on 5/85/2778, randomized to medical arm in the "Reduce" secondary prevention of stroke trial with PFO. No recurrence of stroke since.   Her past medical history is significant for psoriatic arthritis, rheumatoid arthritis, history of nephrolithiasis, history of cerebrovascular AV malformations felt to be small and stroke felt to be cardioembolic in June 2423, sleep apnea on CPAP, she is now being reset for repeat study, COPD and has 40+ year history of smoking cigarettes and follows Dr. Baird Lyons.  This is a 36-monthoffice visit.  States that over the past 1 month, she has noticed decrease excess capacity, exertional chest pain for which she has to take nitroglycerin with relief with radiation of chest pain from the left side of the chest to her left shoulder, with marked dyspnea.  No PND or orthopnea.  No leg edema.  She has not had any new neurologic deficits.  No fever or chills.   Social History   Tobacco Use   Smoking status: Former    Packs/day: 2.00    Years: 30.00    Total pack years: 60.00    Types: Cigarettes    Quit date: 10/20/1999    Years since quitting: 22.7   Smokeless tobacco: Never  Substance Use Topics   Alcohol use: Yes    Comment: occ    ROS  Review of Systems  Cardiovascular:  Positive for chest pain, dyspnea on exertion and palpitations. Negative for leg swelling.  Respiratory:  Positive for snoring (using CPAP) and wheezing.   Musculoskeletal:  Positive for back pain and joint pain.   Objective      07/03/2022   11:35 AM 03/20/2022    2:32 PM 11/03/2021   11:39 AM  Vitals with BMI  Height 5' 4"  5' 4"  5' 4"   Weight 180 lbs 6 oz 182 lbs 3 oz 172 lbs 13  oz  BMI 30.95 [phone] Systolic [id number] Diastolic 69 88 80  Pulse 70 68 59    Blood pressure 107/69, pulse 70, temperature 98.1 F (36.7 C), temperature source Temporal, resp. rate 16, height 5' 4"  (1.626 m), weight 180 lb 6.4 oz (81.8 kg), SpO2 95 %. Body mass index is 30.97 kg/m.   Physical Exam Constitutional:      General: She is in acute distress.     Appearance: She is well-developed.  Neck:     Thyroid: No thyromegaly.     Vascular: No carotid bruit or JVD.  Cardiovascular:     Rate and Rhythm: Normal rate and regular rhythm.     Pulses: Normal pulses and intact distal pulses.     Heart sounds: Normal heart sounds. No murmur heard.    No gallop.  Pulmonary:     Effort: Pulmonary effort is normal.     Breath sounds: Normal breath sounds.  Abdominal:     General: Bowel sounds are normal.     Palpations: Abdomen is soft.  Musculoskeletal:     Right lower leg: No edema.     Left lower leg: No edema.    Laboratory examination:   Labs 06/14/2021:  Hb 14.3/HCT  43.9, platelets 177.  Serum glucose 96 mg, BUN 13, creatinine 0.59, potassium 4.1, EGFR >60 mL.  Labs 11/16/2020:  Sodium 142, potassium 4.4, BUN 14, creatinine 0.58, EGFR >60 mL.  A1c 4.1%.  Labs 08/17/2020:  Hb 13.5/HCT 40.1, platelets 170.  Normal indicis.  Serum glucose 92 mg, BUN 8, creatinine 0.58, EGFR >60 mL.  Sodium 146, potassium 3.7, CO2 18.  CMP otherwise normal.  Quantitative C-reactive protein elevated at 4.0.  External Lab 11/17/2019:  Sodium 142, potassium 3.4, BUN 9, creatinine 0.60, EGFR >60 mL.  CMP otherwise normal.  TSH normal.  Vitamin D 38.  Total cholesterol 191, triglycerides 232, HDL 40, LDL 117.  Hb 14.4/HCT 43.2, platelets 165.  Medications and allergies   Allergies  Allergen Reactions   Aquacel [Carboxymethylcellulose] Other (See Comments)    Blisters    Dextromethorphan Anaphylaxis   Nickel    Sulfonamide Derivatives Anaphylaxis, Hives and Swelling     REACTION: swelling, tongue and hives   Aspirin Nausea And Vomiting    REACTION: severe gi upset.  Pt states she can tolerate 81 mg asa only.    Ciprofloxacin Itching and Swelling    REACTION: angioedema, urticaria   Cosyntropin Hives and Swelling    REACTION: swelling, hives   Adhesive [Tape] Other (See Comments)    Blisters skin   Latex Other (See Comments)    other   Amoxicillin Nausea And Vomiting    REACTION: GI upset   Guaifenesin Rash    REACTION: head rash   Moxifloxacin Other (See Comments)    gi upset   Nsaids Other (See Comments)    Gi upset     Current Outpatient Medications:    albuterol (VENTOLIN HFA) 108 (90 Base) MCG/ACT inhaler, Inhale 1-2 puffs into the lungs every 6 (six) hours as needed for wheezing or shortness of breath., Disp: , Rfl:    alendronate (FOSAMAX) 70 MG tablet, Take 1 tablet by mouth once a week., Disp: , Rfl:    ALPRAZolam (XANAX) 0.5 MG tablet, Take 1 tablet by mouth at bedtime., Disp: , Rfl:    amLODipine (NORVASC) 5 MG tablet, TAKE 1 TABLET BY MOUTH  DAILY, Disp: 90 tablet, Rfl: 3   aspirin EC 81 MG tablet, Take 81 mg by mouth daily., Disp: , Rfl:    Cimetidine-Lido-Salicylic Acid 00-3-70 % GEL, Apply topically., Disp: , Rfl:    divalproex (DEPAKOTE ER) 500 MG 24 hr tablet, Take 1 tablet by mouth 2 (two) times daily., Disp: , Rfl:    ipratropium (ATROVENT) 0.03 % nasal spray, Place 1 spray into the nose in the morning and at bedtime., Disp: , Rfl:    leflunomide (ARAVA) 10 MG tablet, Take 1 tablet by mouth at bedtime., Disp: , Rfl:    loratadine (CLARITIN) 10 MG tablet, Take 1 tablet by mouth daily., Disp: , Rfl:    nitroGLYCERIN (NITROSTAT) 0.4 MG SL tablet, Place 1 tablet (0.4 mg total) under the tongue every 5 (five) minutes as needed for chest pain., Disp: 90 tablet, Rfl: 3   pravastatin (PRAVACHOL) 20 MG tablet, Take 1 tablet (20 mg total) by mouth at bedtime., Disp: 90 tablet, Rfl: 3   topiramate (TOPAMAX) 50 MG tablet, Take 50 mg by mouth  2 (two) times daily., Disp: , Rfl:    triamcinolone cream (KENALOG) 0.1 %, Apply 1 application topically 2 (two) times daily as needed (skin irritation.). , Disp: , Rfl:     Radiology:   Coronary CTA 11/13/2017: 1. Coronary calcium score  of 45. This was 90 percentile for age and sex matched control. 2. Normal coronary origin with right dominance. 3. Minimal plaque in the left main, proximal and mid LAD. Aggressive risk factor modification is recommended. 4.  PFO is present. 5. Lungs clear.   Cardiac Studies:   PCV MYOCARDIAL PERFUSION WO LEXISCAN 07/05/2021  Narrative Exercise Myoview stress test 07/05/2021: 1 Day Rest/Stress Protocol. Exercise time 4 minutes 30 seconds on Bruce protocol, achieved 6.43 METS, 91% APMHR. Stress ECG negative for ischemia. Normal myocardial perfusion without convincing evidence of reversible myocardial ischemia or prior infarct. Left ventricular size is normal, thickness preserved, no regional wall motion abnormalities, calculated LVEF 64%. Low risk study. Compared to 09/03/2017, mild basal inferoseptal and mid inferoseptal ischemia is no longer present.   PCV ECHOCARDIOGRAM COMPLETE 07/05/2021  Narrative Echocardiogram 07/05/2021: Normal LV systolic function with visual EF 55-60%. Left ventricle cavity is normal in size. Normal left ventricular wall thickness. Normal global wall motion. Normal diastolic filling pattern, normal LAP. Trace tricuspid regurgitation. No evidence of pulmonary hypertension. Mild pulmonic regurgitation. Compared to study 08/27/2017 no significant change. Documented PFO on prior TEE not well visualized on current study.     EKG:     EKG 07/03/2022: Ectopic atrial rhythm at rate of 60 bpm, leftward axis, incomplete right bundle branch block.  Nonspecific T abnormality.  Compared to 08/18/2021, ectopic atrial rhythm is new.  EKG 06/22/2021: Normal sinus rhythm at rate of 55 bpm, left atrial enlargement, normal axis.   Nonspecific T abnormality. No significant change from EKG normal sinus rhythm at rate of 47 bpm.   Assessment     ICD-10-CM   1. Dyspnea on exertion  R06.09 PCV MYOCARDIAL PERFUSION WO LEXISCAN    2. Angina pectoris (HCC)  I20.9 EKG 12-Lead    PCV MYOCARDIAL PERFUSION WO LEXISCAN    3. Palpitations  R00.2     4. Pure hypercholesterolemia  E78.00     5. Research study patient: [facility]  impact of lipoprotein(a) lowering with pelacarsen on CV events  Z00.6      No orders of the defined types were placed in this encounter.   Recommendations:   [name]  is a 61 y.o. Caucasian female with history of embolic stroke on 7/61/9509, randomized to medical arm in the "Reduce" secondary prevention of stroke trial with PFO. No recurrence of stroke since.   Her past medical history is significant for psoriatic arthritis, rheumatoid arthritis, history of nephrolithiasis, history of cerebrovascular AV malformations felt to be small and stroke felt to be cardioembolic in June 3267, sleep apnea on CPAP but recently having difficulty with new machine and is working with Dr. Keturah Barre, COPD and has 40+ year history of smoking cigarettes.  She is now enrolled in clinical trial with [facility].  She is tolerating the study medications well.  Lipids are not checked.  Abnormal EKG noted, she has ectopic atrial rhythm and most probable etiology being underlying COPD.  Recent worsening dyspnea may be unrelated to angina pectoris.  However nuclear stress test will certainly help [place] in determining further.  Patient presents for a routine 19-monthoffice visit, has been having frequent episodes of exertional chest tightness on the left side of the chest with radiation to her left shoulder associated with marked dyspnea.  She is concerned about CAD, has been using nitroglycerin with relief of chest pain.  We will schedule her for repeat nuclear stress test, unless abnormal I will see her back in 6  months.  Lipids are well controlled, also continue [facility] protocol.   Patient in  research.  [facility] (protocol [id number]) Description: A randomized double-blind, placebo-controlled, multicenter trialassessing the impact of lipoprotein(a) lowering with pelacarsen (TQJ230) on major cardiovascular events in patients with established cardiovascular disease.   Adrian Prows, MD, [facility] 07/04/2022, 7:00 AM Office: [phone] Pager: [phone]

## 2022-07-04 ENCOUNTER — Encounter: Payer: Self-pay | Admitting: Cardiology

## 2022-07-24 ENCOUNTER — Other Ambulatory Visit: Payer: Self-pay

## 2022-07-24 ENCOUNTER — Ambulatory Visit: Payer: Medicare Other

## 2022-07-24 ENCOUNTER — Telehealth: Payer: Self-pay

## 2022-07-24 VITALS — BP 167/83 | HR 67 | Temp 98.6°F | Resp 16 | Ht 64.0 in | Wt 183.0 lb

## 2022-07-24 DIAGNOSIS — R0609 Other forms of dyspnea: Secondary | ICD-10-CM

## 2022-07-24 DIAGNOSIS — I10 Essential (primary) hypertension: Secondary | ICD-10-CM

## 2022-07-24 DIAGNOSIS — E7800 Pure hypercholesterolemia, unspecified: Secondary | ICD-10-CM

## 2022-07-24 DIAGNOSIS — I209 Angina pectoris, unspecified: Secondary | ICD-10-CM

## 2022-07-24 MED ORDER — ROSUVASTATIN CALCIUM 40 MG PO TABS: 40.0000 mg | ORAL_TABLET | Freq: Every day | ORAL | 3 refills | Status: DC

## 2022-07-24 MED ORDER — ISOSORBIDE DINITRATE 30 MG PO TABS: 30.0000 mg | ORAL_TABLET | Freq: Three times a day (TID) | ORAL | 2 refills | Status: DC

## 2022-07-24 NOTE — Progress Notes (Signed)
Primary Physician/Referring:  Christain Sacramento, MD  Patient ID: [name], female    DOB: Dec 20, 1960, 61 y.o.   MRN: [id number]  Chief Complaint  Patient presents with   Chest Pain   Shortness of Breath    HPI:    [name]  is a 61 y.o. Caucasian female with history of embolic stroke on 5/32/9924, randomized to medical arm in the "Reduce" secondary prevention of stroke trial with PFO. No recurrence of stroke since.   Her past medical history is significant for psoriatic arthritis, rheumatoid arthritis, history of nephrolithiasis, history of cerebrovascular AV malformations felt to be small and stroke felt to be cardioembolic in June 2683, sleep apnea on CPAP, she is now being reset for repeat study, COPD and has 40+ year history of smoking cigarettes and follows Dr. Baird Lyons.  She presents today with complaints on chest pain that has worsened since last visit. Chest pain occurs at rest and with exertion and feels like a squeezing and pressure. She does endorse radiation to left jaw and arm and numbness in her jaw that started today. She denies dizziness, palpitations, diaphoresis, PND, leg edema. She does have strong family history of cardiac disease.  Social History   Tobacco Use   Smoking status: Former    Packs/day: 2.00    Years: 30.00    Total pack years: 60.00    Types: Cigarettes    Quit date: 10/20/1999    Years since quitting: 22.7   Smokeless tobacco: Never  Substance Use Topics   Alcohol use: Yes    Comment: occ    ROS  Review of Systems  Cardiovascular:  Positive for chest pain and dyspnea on exertion. Negative for leg swelling, palpitations and syncope.  Respiratory:  Positive for snoring (using CPAP).    Objective      07/24/2022    9:52 AM 07/03/2022   11:35 AM 03/20/2022    2:32 PM  Vitals with BMI  Height _0  _1  _2   Weight 183 lbs 180 lbs 6 oz 182 lbs 3 oz  BMI 31.4 41.96 22.29  Systolic 798 921 194  Diastolic 83 69 88  Pulse 67  70 68    Blood pressure (!) 167/83, pulse 67, temperature 98.6 F (37 C), temperature source Temporal, resp. rate 16, height _3  (1.626 m), weight 183 lb (83 kg), SpO2 96 %. Body mass index is 31.41 kg/m.   Physical Exam Constitutional:      Appearance: She is well-developed.  Neck:     Thyroid: No thyromegaly.     Vascular: No carotid bruit or JVD.  Cardiovascular:     Rate and Rhythm: Normal rate and regular rhythm.     Pulses: Normal pulses and intact distal pulses.          Carotid pulses are 2+ on the right side and 2+ on the left side.      Radial pulses are 2+ on the right side and 2+ on the left side.       Dorsalis pedis pulses are 2+ on the right side and 2+ on the left side.     Heart sounds: Normal heart sounds. No murmur heard.    No gallop.  Pulmonary:     Effort: Pulmonary effort is normal.     Breath sounds: Normal breath sounds.  Abdominal:     General: Bowel sounds are normal.     Palpations: Abdomen is soft.  Musculoskeletal:  Right lower leg: No edema.     Left lower leg: No edema.    Laboratory examination:   Labs 11/18/2021: Sodium 142, potassium 4.9, BUN 10, creatinine 0.57, glucose 91  TSH 1.45  Cholesterol 214, triglycerides 228, HDL 45, LDL 141  Medications and allergies   Allergies  Allergen Reactions   Aquacel [Carboxymethylcellulose] Other (See Comments)    Blisters    Dextromethorphan Anaphylaxis   Nickel    Sulfonamide Derivatives Anaphylaxis, Hives and Swelling    REACTION: swelling, tongue and hives   Aspirin Nausea And Vomiting    REACTION: severe gi upset.  Pt states she can tolerate 81 mg asa only.    Ciprofloxacin Itching and Swelling    REACTION: angioedema, urticaria   Cosyntropin Hives and Swelling    REACTION: swelling, hives   Adhesive [Tape] Other (See Comments)    Blisters skin   Latex Other (See Comments)    other   Amoxicillin Nausea And Vomiting    REACTION: GI upset   Guaifenesin Rash    REACTION: head  rash   Moxifloxacin Other (See Comments)    gi upset   Nsaids Other (See Comments)    Gi upset     Current Outpatient Medications:    albuterol (VENTOLIN HFA) 108 (90 Base) MCG/ACT inhaler, Inhale 1-2 puffs into the lungs every 6 (six) hours as needed for wheezing or shortness of breath., Disp: , Rfl:    alendronate (FOSAMAX) 70 MG tablet, Take 1 tablet by mouth once a week., Disp: , Rfl:    ALPRAZolam (XANAX) 0.5 MG tablet, Take 1 tablet by mouth at bedtime., Disp: , Rfl:    amLODipine (NORVASC) 5 MG tablet, TAKE 1 TABLET BY MOUTH  DAILY, Disp: 90 tablet, Rfl: 3   aspirin EC 81 MG tablet, Take 81 mg by mouth daily., Disp: , Rfl:    Cimetidine-Lido-Salicylic Acid 00-1-74 % GEL, Apply topically., Disp: , Rfl:    divalproex (DEPAKOTE ER) 500 MG 24 hr tablet, Take 1 tablet by mouth 2 (two) times daily., Disp: , Rfl:    ipratropium (ATROVENT) 0.03 % nasal spray, Place 1 spray into the nose in the morning and at bedtime., Disp: , Rfl:    isosorbide dinitrate (ISORDIL) 30 MG tablet, Take 1 tablet (30 mg total) by mouth 3 (three) times daily., Disp: 90 tablet, Rfl: 2   leflunomide (ARAVA) 10 MG tablet, Take 1 tablet by mouth at bedtime., Disp: , Rfl:    loratadine (CLARITIN) 10 MG tablet, Take 1 tablet by mouth daily., Disp: , Rfl:    rosuvastatin (CRESTOR) 40 MG tablet, Take 1 tablet (40 mg total) by mouth daily., Disp: 90 tablet, Rfl: 3   topiramate (TOPAMAX) 50 MG tablet, Take 50 mg by mouth 2 (two) times daily., Disp: , Rfl:    triamcinolone cream (KENALOG) 0.1 %, Apply 1 application topically 2 (two) times daily as needed (skin irritation.). , Disp: , Rfl:    nitroGLYCERIN (NITROSTAT) 0.4 MG SL tablet, Place 1 tablet (0.4 mg total) under the tongue every 5 (five) minutes as needed for chest pain., Disp: 90 tablet, Rfl: 3    Radiology:   Coronary CTA 11/13/2017: 1. Coronary calcium score of 19. This was 78 percentile for age and sex matched control. 2. Normal coronary origin with right  dominance. 3. Minimal plaque in the left main, proximal and mid LAD. Aggressive risk factor modification is recommended. 4.  PFO is present. 5. Lungs clear.   Cardiac Studies:   PCV  MYOCARDIAL PERFUSION WO LEXISCAN 07/05/2021 Narrative Exercise Myoview stress test 07/05/2021: 1 Day Rest/Stress Protocol. Exercise time 4 minutes 30 seconds on Bruce protocol, achieved 6.43 METS, 91% APMHR. Stress ECG negative for ischemia. Normal myocardial perfusion without convincing evidence of reversible myocardial ischemia or prior infarct. Left ventricular size is normal, thickness preserved, no regional wall motion abnormalities, calculated LVEF 64%. Low risk study. Compared to 09/03/2017, mild basal inferoseptal and mid inferoseptal ischemia is no longer present.   PCV ECHOCARDIOGRAM COMPLETE 07/05/2021 Narrative Echocardiogram 07/05/2021: Normal LV systolic function with visual EF 55-60%. Left ventricle cavity is normal in size. Normal left ventricular wall thickness. Normal global wall motion. Normal diastolic filling pattern, normal LAP. Trace tricuspid regurgitation. No evidence of pulmonary hypertension. Mild pulmonic regurgitation. Compared to study 08/27/2017 no significant change. Documented PFO on prior TEE not well visualized on current study.  EKG:   EKG 07/24/2022: Sinus bradycardia rate of 57 bpm.  Normal axis.  Left atrial enlargement.  Nonspecific T wave abnormality.  Compared to previous EKG on 07/03/2022 sinus bradycardia has replaced ectopic atrial rhythm.  EKG 07/03/2022: Ectopic atrial rhythm at rate of 60 bpm, leftward axis, incomplete right bundle branch block.  Nonspecific T abnormality.  Compared to 08/18/2021, ectopic atrial rhythm is new.  Assessment     ICD-10-CM   1. Angina pectoris (HCC)  I20.9 EKG 12-Lead    CBC    CMP14+EGFR    2. Dyspnea on exertion  R06.09 CBC    CMP14+EGFR    3. Essential hypertension  I10     4. Pure hypercholesterolemia  E78.00       Meds ordered this encounter  Medications   isosorbide dinitrate (ISORDIL) 30 MG tablet    Sig: Take 1 tablet (30 mg total) by mouth 3 (three) times daily.    Dispense:  90 tablet    Refill:  2    Order Specific Question:   Supervising Provider    Answer:   Adrian Prows [[id number]]   rosuvastatin (CRESTOR) 40 MG tablet    Sig: Take 1 tablet (40 mg total) by mouth daily.    Dispense:  90 tablet    Refill:  3    Order Specific Question:   Supervising Provider    Answer:   Adrian Prows [[id number]]    Recommendations:   Angina pectoris ([place]) Dyspnea on exertion Given ongoing angina at rest and with exertion with accompanying dyspnea and pain radiation will cancel stress test and schedule for cardiac catherization. Start isosorbide 56m three time daily for chest pain and blood pressure control. Given low heart rate will not start metoprolol at this time. Continue aspirin 836mdaily. Placed order for CBC and CMP.  Schedule for cardiac catheterization, and possible angioplasty. We discussed regarding risks, benefits, alternatives to this including stress testing, CTA and continued medical therapy. Patient wants to proceed. Understands <1-2% risk of death, stroke, MI, urgent CABG, bleeding, infection, renal failure but not limited to these. Patient instructed not to do heavy lifting, heavy exertional activity, swimming until evaluation is complete.  Patient instructed to call if symptoms worse or to go to the ED for further evaluation.  Essential hypertension Blood pressure is elevated today in setting of chest pain. She does monitor blood pressure at home and readings are >140/80. Start isosorbide as mentioned above  Pure hypercholesterolemia Reviewed recent labs, LDL 141 Stop pravastatin and start rosuvastatin 4027maily for better LDL control  Patient in  research.  [facility] (protocol #[id number]: A randomized double-blind,  placebo-controlled, multicenter trialassessing the  impact of lipoprotein(a) lowering with pelacarsen (SPZ980) on major cardiovascular events in patients with established cardiovascular disease.  Follow-up after cardiac catherization   Ernst Spell, AGNP-C 07/24/2022, 10:40 AM Office: [phone] Pager: [phone]

## 2022-07-24 NOTE — Telephone Encounter (Signed)
Ok, thank you for letting me know.

## 2022-07-24 NOTE — Telephone Encounter (Signed)
Patient called stating that she has been having chest pain radiating in to her left arm. I transferred call to front desk staff [facility]) to schedule appointment.

## 2022-07-25 ENCOUNTER — Encounter (HOSPITAL_COMMUNITY)
Admission: RE | Disposition: A | Discharge status: Home / Self Care | Payer: Self-pay | Source: Home / Self Care | Attending: Cardiology

## 2022-07-25 ENCOUNTER — Other Ambulatory Visit: Payer: Self-pay

## 2022-07-25 ENCOUNTER — Ambulatory Visit (HOSPITAL_COMMUNITY)
Admission: RE | Admit: 2022-07-25 | Discharge: 2022-07-25 | Disposition: A | Discharge status: Home / Self Care | Payer: Medicare Other | Attending: Cardiology | Admitting: Cardiology

## 2022-07-25 DIAGNOSIS — Q2112 Patent foramen ovale: Secondary | ICD-10-CM | POA: Insufficient documentation

## 2022-07-25 DIAGNOSIS — G4730 Sleep apnea, unspecified: Secondary | ICD-10-CM | POA: Insufficient documentation

## 2022-07-25 DIAGNOSIS — I200 Unstable angina: Secondary | ICD-10-CM | POA: Diagnosis not present

## 2022-07-25 DIAGNOSIS — Z7982 Long term (current) use of aspirin: Secondary | ICD-10-CM | POA: Insufficient documentation

## 2022-07-25 DIAGNOSIS — R0609 Other forms of dyspnea: Secondary | ICD-10-CM | POA: Diagnosis not present

## 2022-07-25 DIAGNOSIS — E7800 Pure hypercholesterolemia, unspecified: Secondary | ICD-10-CM | POA: Insufficient documentation

## 2022-07-25 DIAGNOSIS — R072 Precordial pain: Secondary | ICD-10-CM

## 2022-07-25 DIAGNOSIS — Z87891 Personal history of nicotine dependence: Secondary | ICD-10-CM | POA: Diagnosis not present

## 2022-07-25 DIAGNOSIS — M069 Rheumatoid arthritis, unspecified: Secondary | ICD-10-CM | POA: Diagnosis not present

## 2022-07-25 DIAGNOSIS — Z79899 Other long term (current) drug therapy: Secondary | ICD-10-CM | POA: Diagnosis not present

## 2022-07-25 DIAGNOSIS — J449 Chronic obstructive pulmonary disease, unspecified: Secondary | ICD-10-CM | POA: Diagnosis not present

## 2022-07-25 DIAGNOSIS — I10 Essential (primary) hypertension: Secondary | ICD-10-CM | POA: Diagnosis not present

## 2022-07-25 HISTORY — PX: LEFT HEART CATH AND CORONARY ANGIOGRAPHY: CATH118249

## 2022-07-25 HISTORY — PX: INTRAVASCULAR PRESSURE WIRE/FFR STUDY: CATH118243

## 2022-07-25 HISTORY — PX: CORONARY PRESSURE/FFR STUDY: CATH118243

## 2022-07-25 LAB — CMP14+EGFR
ALT: 55 IU/L — ABNORMAL HIGH (ref 0–32)
AST: 26 IU/L (ref 0–40)
Albumin/Globulin Ratio: 1.8 (ref 1.2–2.2)
Albumin: 4.3 g/dL (ref 3.9–4.9)
Alkaline Phosphatase: 99 IU/L (ref 44–121)
BUN/Creatinine Ratio: 18 (ref 12–28)
BUN: 12 mg/dL (ref 8–27)
Bilirubin Total: 0.3 mg/dL (ref 0.0–1.2)
CO2: 16 mmol/L — ABNORMAL LOW (ref 20–29)
Calcium: 9.3 mg/dL (ref 8.7–10.3)
Chloride: 110 mmol/L — ABNORMAL HIGH (ref 96–106)
Creatinine, Ser: 0.68 mg/dL (ref 0.57–1.00)
Globulin, Total: 2.4 g/dL (ref 1.5–4.5)
Glucose: 97 mg/dL (ref 70–99)
Potassium: 4.1 mmol/L (ref 3.5–5.2)
Sodium: 140 mmol/L (ref 134–144)
Total Protein: 6.7 g/dL (ref 6.0–8.5)
eGFR: 98 mL/min/{1.73_m2} (ref >59)

## 2022-07-25 LAB — CBC
Hematocrit: 44.2 % (ref 34.0–46.6)
Hemoglobin: 14.6 g/dL (ref 11.1–15.9)
MCH: 31.1 pg (ref 26.6–33.0)
MCHC: 33 g/dL (ref 31.5–35.7)
MCV: 94 fL (ref 79–97)
Platelets: 176 10*3/uL (ref 150–450)
RBC: 4.7 x10E6/uL (ref 3.77–5.28)
RDW: 12.9 % (ref 11.7–15.4)
WBC: 5.4 10*3/uL (ref 3.4–10.8)

## 2022-07-25 SURGERY — LEFT HEART CATH AND CORONARY ANGIOGRAPHY
Anesthesia: LOCAL

## 2022-07-25 MED ORDER — SODIUM CHLORIDE 0.9% FLUSH: 3.0000 mL | INTRAVENOUS | Status: DC | PRN

## 2022-07-25 MED ORDER — NITROGLYCERIN 1 MG/10 ML FOR IR/CATH LAB
INTRA_ARTERIAL | Status: AC
  Filled 2022-07-25: qty 10

## 2022-07-25 MED ORDER — FENTANYL CITRATE (PF) 100 MCG/2ML IJ SOLN
INTRAMUSCULAR | Status: DC | PRN
  Administered 2022-07-25: 50 ug via INTRAVENOUS

## 2022-07-25 MED ORDER — SODIUM CHLORIDE 0.9 % WEIGHT BASED INFUSION: 1.0000 mL/kg/h | INTRAVENOUS | Status: DC

## 2022-07-25 MED ORDER — SODIUM CHLORIDE 0.9 % IV SOLN: INTRAVENOUS | Status: AC

## 2022-07-25 MED ORDER — SODIUM CHLORIDE 0.9% FLUSH: 3.0000 mL | Freq: Two times a day (BID) | INTRAVENOUS | Status: DC

## 2022-07-25 MED ORDER — VERAPAMIL HCL 2.5 MG/ML IV SOLN
INTRAVENOUS | Status: AC
  Filled 2022-07-25: qty 2

## 2022-07-25 MED ORDER — ADENOSINE 12 MG/4ML IV SOLN
INTRAVENOUS | Status: AC
  Filled 2022-07-25: qty 16

## 2022-07-25 MED ORDER — ADENOSINE (DIAGNOSTIC) 140MCG/KG/MIN
INTRAVENOUS | Status: DC | PRN
  Administered 2022-07-25: 140 ug/kg/min via INTRAVENOUS

## 2022-07-25 MED ORDER — HEPARIN SODIUM (PORCINE) 1000 UNIT/ML IJ SOLN
INTRAMUSCULAR | Status: DC | PRN
  Administered 2022-07-25: 4000 [IU] via INTRAVENOUS
  Administered 2022-07-25: 5000 [IU] via INTRAVENOUS

## 2022-07-25 MED ORDER — ASPIRIN 81 MG PO CHEW
81.0000 mg | CHEWABLE_TABLET | ORAL | Status: AC
  Administered 2022-07-25: 81 mg via ORAL
  Filled 2022-07-25: qty 1

## 2022-07-25 MED ORDER — LABETALOL HCL 5 MG/ML IV SOLN: 10.0000 mg | INTRAVENOUS | Status: DC | PRN

## 2022-07-25 MED ORDER — MIDAZOLAM HCL 2 MG/2ML IJ SOLN
INTRAMUSCULAR | Status: AC
  Filled 2022-07-25: qty 2

## 2022-07-25 MED ORDER — FENTANYL CITRATE (PF) 100 MCG/2ML IJ SOLN
INTRAMUSCULAR | Status: AC
  Filled 2022-07-25: qty 2

## 2022-07-25 MED ORDER — HYDRALAZINE HCL 20 MG/ML IJ SOLN: 10.0000 mg | INTRAMUSCULAR | Status: DC | PRN

## 2022-07-25 MED ORDER — SODIUM CHLORIDE 0.9 % WEIGHT BASED INFUSION
3.0000 mL/kg/h | INTRAVENOUS | Status: AC
  Administered 2022-07-25: 3 mL/kg/h via INTRAVENOUS

## 2022-07-25 MED ORDER — HEPARIN (PORCINE) IN NACL 1000-0.9 UT/500ML-% IV SOLN
INTRAVENOUS | Status: AC
  Filled 2022-07-25: qty 1000

## 2022-07-25 MED ORDER — ACETAMINOPHEN 325 MG PO TABS: 650.0000 mg | ORAL_TABLET | ORAL | Status: DC | PRN

## 2022-07-25 MED ORDER — SODIUM CHLORIDE 0.9 % IV SOLN: 250.0000 mL | INTRAVENOUS | Status: DC | PRN

## 2022-07-25 MED ORDER — NITROGLYCERIN 1 MG/10 ML FOR IR/CATH LAB
INTRA_ARTERIAL | Status: DC | PRN
  Administered 2022-07-25: 200 ug via INTRACORONARY

## 2022-07-25 MED ORDER — IOHEXOL 350 MG/ML SOLN
INTRAVENOUS | Status: DC | PRN
  Administered 2022-07-25: 25 mL

## 2022-07-25 MED ORDER — MIDAZOLAM HCL 2 MG/2ML IJ SOLN
INTRAMUSCULAR | Status: DC | PRN
  Administered 2022-07-25: 1 mg via INTRAVENOUS

## 2022-07-25 MED ORDER — VERAPAMIL HCL 2.5 MG/ML IV SOLN
INTRAVENOUS | Status: DC | PRN
  Administered 2022-07-25: 10 mL via INTRA_ARTERIAL

## 2022-07-25 MED ORDER — LIDOCAINE HCL (PF) 1 % IJ SOLN
INTRAMUSCULAR | Status: AC
  Filled 2022-07-25: qty 30

## 2022-07-25 MED ORDER — NITROGLYCERIN 0.4 MG SL SUBL
SUBLINGUAL_TABLET | SUBLINGUAL | Status: AC
  Administered 2022-07-25: 0.4 mg via SUBLINGUAL
  Filled 2022-07-25: qty 1

## 2022-07-25 MED ORDER — HEPARIN SODIUM (PORCINE) 1000 UNIT/ML IJ SOLN
INTRAMUSCULAR | Status: AC
  Filled 2022-07-25: qty 10

## 2022-07-25 MED ORDER — LIDOCAINE HCL (PF) 1 % IJ SOLN
INTRAMUSCULAR | Status: DC | PRN
  Administered 2022-07-25: 2 mL

## 2022-07-25 MED ORDER — NITROGLYCERIN 0.4 MG SL SUBL
0.4000 mg | SUBLINGUAL_TABLET | SUBLINGUAL | Status: DC | PRN
  Administered 2022-07-25: 0.4 mg via SUBLINGUAL

## 2022-07-25 MED ORDER — HEPARIN (PORCINE) IN NACL 1000-0.9 UT/500ML-% IV SOLN
INTRAVENOUS | Status: DC | PRN
  Administered 2022-07-25 (×2): 500 mL

## 2022-07-25 SURGICAL SUPPLY — 14 items
BAND CMPR LRG ZPHR (HEMOSTASIS) ×1
BAND ZEPHYR COMPRESS 30 LONG (HEMOSTASIS) IMPLANT
CATH OPTITORQUE TIG 4.0 5F (CATHETERS) IMPLANT
CATH VISTA GUIDE 6FR XB3.5 (CATHETERS) IMPLANT
GLIDESHEATH SLEND A-KIT 6F 22G (SHEATH) IMPLANT
GUIDEWIRE INQWIRE 1.5J.035X260 (WIRE) IMPLANT
GUIDEWIRE PRESSURE X 175 (WIRE) IMPLANT
INQWIRE 1.5J .035X260CM (WIRE) ×1
KIT HEART LEFT (KITS) ×1 IMPLANT
KIT HEMO VALVE WATCHDOG (MISCELLANEOUS) IMPLANT
PACK CARDIAC CATHETERIZATION (CUSTOM PROCEDURE TRAY) ×1 IMPLANT
SHEATH PROBE COVER 6X72 (BAG) IMPLANT
TRANSDUCER W/STOPCOCK (MISCELLANEOUS) ×1 IMPLANT
TUBING CIL FLEX 10 FLL-RA (TUBING) ×1 IMPLANT

## 2022-07-25 NOTE — Interval H&P Note (Signed)
History and Physical Interval Note:  07/25/2022 6:07 PM  [name]  has presented today for surgery, with the diagnosis of nstemi.  The various methods of treatment have been discussed with the patient and family. After consideration of risks, benefits and other options for treatment, the patient has consented to  Procedure(s): RIGHT/LEFT HEART CATH AND CORONARY ANGIOGRAPHY (N/A) as a surgical intervention.  The patient's history has been reviewed, patient examined, no change in status, stable for surgery.  I have reviewed the patient's chart and labs.  Questions were answered to the patient's satisfaction.    2012 Appropriate Use Criteria for Diagnostic Catheterization Home / Select Test of Interest Desired Test and Scenario Suspected CAD No Prior Noninvasive Imaging Pretest Probability of CAD CAD Assessment (Coronary Angiography With or Without Left Heart Catheterization and/or Left Ventricu CAD Assessment (Coronary Angiography With or Without Left Heart Catheterization and/or Left Ventriculography) Link Here: [email or web address] Indication:  Suspected CAD (No Prior PCI, No Prior CABG, and No Prior Angiogram Showing >= 50% Angiographic Stenosis) No Prior Noninvasive Testing Symptomatic High Pretest Probability A (7) Indication: 10; Score 7   [place]

## 2022-07-25 NOTE — H&P (Signed)
OV 07/24/2022 copied for documentation    Primary Physician/Referring:  Christain Sacramento, MD  Patient ID: [name], female    DOB: May 04, 1961, 61 y.o.   MRN: [id number]  Chief Complaint  Patient presents with   Chest Pain   Shortness of Breath    HPI:    [name]  is a 61 y.o. Caucasian female with history of embolic stroke on 7/82/9562, randomized to medical arm in the "Reduce" secondary prevention of stroke trial with PFO. No recurrence of stroke since.   Her past medical history is significant for psoriatic arthritis, rheumatoid arthritis, history of nephrolithiasis, history of cerebrovascular AV malformations felt to be small and stroke felt to be cardioembolic in June 1308, sleep apnea on CPAP, she is now being reset for repeat study, COPD and has 40+ year history of smoking cigarettes and follows Dr. Baird Lyons.  She presents today with complaints on chest pain that has worsened since last visit. Chest pain occurs at rest and with exertion and feels like a squeezing and pressure. She does endorse radiation to left jaw and arm and numbness in her jaw that started today. She denies dizziness, palpitations, diaphoresis, PND, leg edema. She does have strong family history of cardiac disease.  Social History   Tobacco Use   Smoking status: Former    Packs/day: 2.00    Years: 30.00    Total pack years: 60.00    Types: Cigarettes    Quit date: 10/20/1999    Years since quitting: 22.7   Smokeless tobacco: Never  Substance Use Topics   Alcohol use: Yes    Comment: occ    ROS  Review of Systems  Cardiovascular:  Positive for chest pain and dyspnea on exertion. Negative for leg swelling, palpitations and syncope.  Respiratory:  Positive for snoring (using CPAP).    Objective      07/24/2022    9:52 AM 07/03/2022   11:35 AM 03/20/2022    2:32 PM  Vitals with BMI  Height _0  _1  _2   Weight 183 lbs 180 lbs 6 oz 182 lbs 3 oz  BMI 31.4 65.78 46.96  Systolic 295  284 132  Diastolic 83 69 88  Pulse 67 70 68    Blood pressure (!) 167/83, pulse 67, temperature 98.6 F (37 C), temperature source Temporal, resp. rate 16, height _3  (1.626 m), weight 183 lb (83 kg), SpO2 96 %. Body mass index is 31.41 kg/m.   Physical Exam Constitutional:      Appearance: She is well-developed.  Neck:     Thyroid: No thyromegaly.     Vascular: No carotid bruit or JVD.  Cardiovascular:     Rate and Rhythm: Normal rate and regular rhythm.     Pulses: Normal pulses and intact distal pulses.          Carotid pulses are 2+ on the right side and 2+ on the left side.      Radial pulses are 2+ on the right side and 2+ on the left side.       Dorsalis pedis pulses are 2+ on the right side and 2+ on the left side.     Heart sounds: Normal heart sounds. No murmur heard.    No gallop.  Pulmonary:     Effort: Pulmonary effort is normal.     Breath sounds: Normal breath sounds.  Abdominal:     General: Bowel sounds are normal.     Palpations:  Abdomen is soft.  Musculoskeletal:     Right lower leg: No edema.     Left lower leg: No edema.    Laboratory examination:   Labs 11/18/2021: Sodium 142, potassium 4.9, BUN 10, creatinine 0.57, glucose 91  TSH 1.45  Cholesterol 214, triglycerides 228, HDL 45, LDL 141  Medications and allergies   Allergies  Allergen Reactions   Aquacel [Carboxymethylcellulose] Other (See Comments)    Blisters    Dextromethorphan Anaphylaxis   Nickel    Sulfonamide Derivatives Anaphylaxis, Hives and Swelling    REACTION: swelling, tongue and hives   Aspirin Nausea And Vomiting    REACTION: severe gi upset.  Pt states she can tolerate 81 mg asa only.    Ciprofloxacin Itching and Swelling    REACTION: angioedema, urticaria   Cosyntropin Hives and Swelling    REACTION: swelling, hives   Adhesive [Tape] Other (See Comments)    Blisters skin   Latex Other (See Comments)    other   Amoxicillin Nausea And Vomiting    REACTION: GI upset    Guaifenesin Rash    REACTION: head rash   Moxifloxacin Other (See Comments)    gi upset   Nsaids Other (See Comments)    Gi upset     Current Outpatient Medications:    albuterol (VENTOLIN HFA) 108 (90 Base) MCG/ACT inhaler, Inhale 1-2 puffs into the lungs every 6 (six) hours as needed for wheezing or shortness of breath., Disp: , Rfl:    alendronate (FOSAMAX) 70 MG tablet, Take 1 tablet by mouth once a week., Disp: , Rfl:    ALPRAZolam (XANAX) 0.5 MG tablet, Take 1 tablet by mouth at bedtime., Disp: , Rfl:    amLODipine (NORVASC) 5 MG tablet, TAKE 1 TABLET BY MOUTH  DAILY, Disp: 90 tablet, Rfl: 3   aspirin EC 81 MG tablet, Take 81 mg by mouth daily., Disp: , Rfl:    Cimetidine-Lido-Salicylic Acid 51-8-84 % GEL, Apply topically., Disp: , Rfl:    divalproex (DEPAKOTE ER) 500 MG 24 hr tablet, Take 1 tablet by mouth 2 (two) times daily., Disp: , Rfl:    ipratropium (ATROVENT) 0.03 % nasal spray, Place 1 spray into the nose in the morning and at bedtime., Disp: , Rfl:    isosorbide dinitrate (ISORDIL) 30 MG tablet, Take 1 tablet (30 mg total) by mouth 3 (three) times daily., Disp: 90 tablet, Rfl: 2   leflunomide (ARAVA) 10 MG tablet, Take 1 tablet by mouth at bedtime., Disp: , Rfl:    loratadine (CLARITIN) 10 MG tablet, Take 1 tablet by mouth daily., Disp: , Rfl:    rosuvastatin (CRESTOR) 40 MG tablet, Take 1 tablet (40 mg total) by mouth daily., Disp: 90 tablet, Rfl: 3   topiramate (TOPAMAX) 50 MG tablet, Take 50 mg by mouth 2 (two) times daily., Disp: , Rfl:    triamcinolone cream (KENALOG) 0.1 %, Apply 1 application topically 2 (two) times daily as needed (skin irritation.). , Disp: , Rfl:    nitroGLYCERIN (NITROSTAT) 0.4 MG SL tablet, Place 1 tablet (0.4 mg total) under the tongue every 5 (five) minutes as needed for chest pain., Disp: 90 tablet, Rfl: 3    Radiology:   Coronary CTA 11/13/2017: 1. Coronary calcium score of 19. This was 55 percentile for age and sex matched control. 2.  Normal coronary origin with right dominance. 3. Minimal plaque in the left main, proximal and mid LAD. Aggressive risk factor modification is recommended. 4.  PFO is present. 5.  Lungs clear.   Cardiac Studies:   PCV MYOCARDIAL PERFUSION WO LEXISCAN 07/05/2021 Narrative Exercise Myoview stress test 07/05/2021: 1 Day Rest/Stress Protocol. Exercise time 4 minutes 30 seconds on Bruce protocol, achieved 6.43 METS, 91% APMHR. Stress ECG negative for ischemia. Normal myocardial perfusion without convincing evidence of reversible myocardial ischemia or prior infarct. Left ventricular size is normal, thickness preserved, no regional wall motion abnormalities, calculated LVEF 64%. Low risk study. Compared to 09/03/2017, mild basal inferoseptal and mid inferoseptal ischemia is no longer present.   PCV ECHOCARDIOGRAM COMPLETE 07/05/2021 Narrative Echocardiogram 07/05/2021: Normal LV systolic function with visual EF 55-60%. Left ventricle cavity is normal in size. Normal left ventricular wall thickness. Normal global wall motion. Normal diastolic filling pattern, normal LAP. Trace tricuspid regurgitation. No evidence of pulmonary hypertension. Mild pulmonic regurgitation. Compared to study 08/27/2017 no significant change. Documented PFO on prior TEE not well visualized on current study.  EKG:   EKG 07/24/2022: Sinus bradycardia rate of 57 bpm.  Normal axis.  Left atrial enlargement.  Nonspecific T wave abnormality.  Compared to previous EKG on 07/03/2022 sinus bradycardia has replaced ectopic atrial rhythm.  EKG 07/03/2022: Ectopic atrial rhythm at rate of 60 bpm, leftward axis, incomplete right bundle branch block.  Nonspecific T abnormality.  Compared to 08/18/2021, ectopic atrial rhythm is new.  Assessment     ICD-10-CM   1. Angina pectoris (HCC)  I20.9 EKG 12-Lead    CBC    CMP14+EGFR    2. Dyspnea on exertion  R06.09 CBC    CMP14+EGFR    3. Essential hypertension  I10     4. Pure  hypercholesterolemia  E78.00      Meds ordered this encounter  Medications   isosorbide dinitrate (ISORDIL) 30 MG tablet    Sig: Take 1 tablet (30 mg total) by mouth 3 (three) times daily.    Dispense:  90 tablet    Refill:  2    Order Specific Question:   Supervising Provider    Answer:   Adrian Prows [[id number]]   rosuvastatin (CRESTOR) 40 MG tablet    Sig: Take 1 tablet (40 mg total) by mouth daily.    Dispense:  90 tablet    Refill:  3    Order Specific Question:   Supervising Provider    Answer:   Adrian Prows [[id number]]    Recommendations:   Angina pectoris ([place]) Dyspnea on exertion Given ongoing angina at rest and with exertion with accompanying dyspnea and pain radiation will cancel stress test and schedule for cardiac catherization. Start isosorbide 70m three time daily for chest pain and blood pressure control. Given low heart rate will not start metoprolol at this time. Continue aspirin 858mdaily. Placed order for CBC and CMP.  Schedule for cardiac catheterization, and possible angioplasty. We discussed regarding risks, benefits, alternatives to this including stress testing, CTA and continued medical therapy. Patient wants to proceed. Understands <1-2% risk of death, stroke, MI, urgent CABG, bleeding, infection, renal failure but not limited to these. Patient instructed not to do heavy lifting, heavy exertional activity, swimming until evaluation is complete.  Patient instructed to call if symptoms worse or to go to the ED for further evaluation.  Essential hypertension Blood pressure is elevated today in setting of chest pain. She does monitor blood pressure at home and readings are <140/80. Start isosorbide as mentioned above  Pure hypercholesterolemia Reviewed recent labs, LDL 141 Stop pravastatin and start rosuvastatin 4027maily for better LDL control  Patient in  research.  [facility] (protocol [phone]) Description: A randomized double-blind, placebo-controlled,  multicenter trialassessing the impact of lipoprotein(a) lowering with pelacarsen (TQJ230) on major cardiovascular events in patients with established cardiovascular disease.  Follow-up after cardiac catherization   Ernst Spell, AGNP-C 07/24/2022, 10:40 AM Office: [phone] Pager: [phone]

## 2022-07-26 ENCOUNTER — Other Ambulatory Visit: Payer: Self-pay | Admitting: Cardiology

## 2022-07-26 ENCOUNTER — Encounter (HOSPITAL_COMMUNITY): Payer: Self-pay | Admitting: Cardiology

## 2022-07-26 DIAGNOSIS — I10 Essential (primary) hypertension: Secondary | ICD-10-CM

## 2022-07-31 ENCOUNTER — Other Ambulatory Visit: Payer: Medicare Other

## 2022-09-11 ENCOUNTER — Encounter: Payer: Self-pay | Admitting: Cardiology

## 2022-09-11 ENCOUNTER — Encounter: Payer: Medicare Other | Admitting: Cardiology

## 2022-09-11 ENCOUNTER — Ambulatory Visit: Payer: Medicare Other

## 2022-09-11 DIAGNOSIS — Z006 Encounter for examination for normal comparison and control in clinical research program: Secondary | ICD-10-CM

## 2022-09-11 NOTE — Progress Notes (Signed)
ICD-10-CM   1. Research study patient: [facility]  impact of lipoprotein(a) lowering with pelacarsen on CV events  Z00.6       Yates Decamp, MD, [facility] 09/11/2022, 8:28 PM Office: [phone] Fax: [phone] Pager: [phone]

## 2022-12-29 ENCOUNTER — Ambulatory Visit: Payer: Medicare Other | Admitting: Cardiology

## 2023-03-04 ENCOUNTER — Other Ambulatory Visit: Payer: Self-pay | Admitting: Cardiology

## 2023-03-04 DIAGNOSIS — I10 Essential (primary) hypertension: Secondary | ICD-10-CM

## 2023-03-13 ENCOUNTER — Other Ambulatory Visit: Payer: Self-pay

## 2023-03-13 ENCOUNTER — Encounter: Payer: Medicare Other | Admitting: Cardiology

## 2023-03-13 MED ORDER — NITROGLYCERIN 0.4 MG SL SUBL: 0.4000 mg | SUBLINGUAL_TABLET | SUBLINGUAL | 3 refills | Status: AC | PRN

## 2023-03-13 NOTE — Progress Notes (Deleted)
ICD-10-CM   1. Research study patient: [facility]  impact of lipoprotein(a) lowering with pelacarsen on CV events  Z00.6     2. Angina pectoris (HCC)  I20.9       Yates Decamp, MD, [facility] 03/13/2023, 2:45 PM Office: [phone] Fax: [phone] Pager: [phone]

## 2023-03-22 ENCOUNTER — Ambulatory Visit: Payer: Medicare Other | Admitting: Internal Medicine

## 2023-04-20 ENCOUNTER — Ambulatory Visit: Payer: Medicare Other | Admitting: Internal Medicine

## 2023-05-09 NOTE — Progress Notes (Signed)
HPI F former 2ppd/ 60 pkyr smoker seen for OSA, dyspnea/ moderate asthma, complicated by hx CVA PFT 11/03/2013-mild obstructive airways disease with significant response to bronchodilator, normal lung volumes, mild diffusion defect. FVC 3.02/87%, FEV1 2.60/96%, FEV1/FVC 0.86, FEF 25-75% 4.50/174%, TLC 98%, DLCO 69%. Office Spirometry 10/05/2016-WNL-FVC 3.35/96%, FEV1 2.63/96%, ratio 0.78  HST 09/13/21- AHI 5.6/ hr, desaturation to 88%, body weight 168 lbs -------------------------------------------   03/20/22-  62 year old female followed for  -former 2 pack per day/(60 pack year) smoker,  OSA, dyspnea/moderate asthma/ COPD complicated by history CVA/ PFO, Migraine, IBS, GERD, Complex Partial Epilepsy, Rheumatoid Arthritis, Anxiety/Depression, Hyperlipidemia,  -Ventolin hfa, Atrovent nasal, Claritin, Xanax, Nebulizer, Lunesta 2 CPAP 5-15/ DME  Download-compliance 53%, AHI 1.1/ hr Body weight today-182 lbs Covid vax-none Flu vax- had CPAP replaced in Feb. Admits falling asleep with TV before putting mask on. FFM irritates [place]. Bothersome spring pollen, rain/ humid blamed for some cough and wheeze. No major exacerbation. Uses rescue 2-3x/ week. CXR 07/04/21-  FINDINGS: The heart size and mediastinal contours are within normal limits. Both lungs are clear. The visualized skeletal structures are unremarkable. IMPRESSION: No active cardiopulmonary disease.  05/10/23- 62 year old female followed for  -former 2 pack per day/(60 pack year) smoker,  OSA, dyspnea/moderate asthma/ COPD complicated by history CVA/ PFO, Migraine, IBS, GERD, Complex Partial Epilepsy, Rheumatoid Arthritis, Anxiety/Depression, Hyperlipidemia,  -Ventolin hfa, Atrovent nasal, Claritin, Xanax, Nebulizer,  CPAP 5-12/ DME [employer]   AirSense 11 AutoSet Download-compliance 57%, AHI 0.5/ hr Body weight today-193 lbs ------Pt doing well. Has received new cpap mask states she really loves it and works much  better for her She says she is up a lot at night caring for husband on chemo for leukemia, which is why her compliance has been low. Download reviewed with her. Download reviewed with her and goals reinforced. Outdoors the heat and humidity are bothering her. Some cough,  Says she can't afford a maintenance inhaler. Emphasized use of her nebulizer machine.  ROS-see HPI Constitutional:   No-   weight loss, night sweats, fevers, chills, fatigue, lassitude. HEENT:   + headaches, No-difficulty swallowing, tooth/dental problems, sore throat,       No-  sneezing, itching, ear ache, nasal congestion, post nasal drip,  CV:  No-   chest pain, orthopnea, PND, swelling in lower extremities, anasarca,  dizziness, palpitations Resp: +shortness of breath with exertion or at rest.              No-   productive cough,  No non-productive cough,  No- coughing up of blood.              No-   change in color of mucus.  +wheezing.   Skin: No-   rash or lesions. GI:  No-   heartburn, indigestion, abdominal pain, nausea, vomiting, GU: . MS:  +joint pain or swelling.  . Neuro-     ?seizures Psych:  No- change in mood or affect. No depression or anxiety.  No memory loss.  OBJ- Physical Exam General- Alert, Oriented, Affect-appropriate, Distress- none acute,  + overweight Skin- rash-none, lesions- none, excoriation- none Lymphadenopathy- none Head- atraumatic            Eyes- Gross vision intact, PERRLA, conjunctivae and secretions clear            Ears- gross hearing normal            Nose- Clear, no-Septal dev, mucus, polyps, erosion, perforation  Throat- Mallampati III-IV , mucosa clear , drainage- none, tonsils-absent,  Neck- flexible , trachea midline, no stridor , thyroid nl, carotid no bruit Chest - symmetrical excursion , unlabored           Heart/CV- RRR , no murmur heard (I don't hear PFO), no gallop  ,                            - JVD- none , edema- none, stasis changes- none, varices-  none           Lung- clear to P&A, wheeze- none, cough- none , dullness-none, rub- none           Chest wall-  Abd-  Br/ Gen/ Rectal- Not done, not indicated Extrem- cyanosis- none, clubbing, none, atrophy- none, strength- nl Neuro- grossly intact to observation

## 2023-05-10 ENCOUNTER — Ambulatory Visit (INDEPENDENT_AMBULATORY_CARE_PROVIDER_SITE_OTHER): Payer: Medicare Other

## 2023-05-10 ENCOUNTER — Encounter: Payer: Self-pay | Admitting: Internal Medicine

## 2023-05-10 ENCOUNTER — Ambulatory Visit: Payer: Medicare Other | Admitting: Internal Medicine

## 2023-05-10 VITALS — BP 120/78 | HR 64 | Ht 65.0 in | Wt 193.0 lb

## 2023-05-10 DIAGNOSIS — J449 Chronic obstructive pulmonary disease, unspecified: Secondary | ICD-10-CM | POA: Diagnosis not present

## 2023-05-10 DIAGNOSIS — J4540 Moderate persistent asthma, uncomplicated: Secondary | ICD-10-CM | POA: Diagnosis not present

## 2023-05-10 DIAGNOSIS — G4733 Obstructive sleep apnea (adult) (pediatric): Secondary | ICD-10-CM

## 2023-05-10 NOTE — Patient Instructions (Signed)
Order- CXR-   dx COPD mixed type  Try to use your CPAP anytime you sleep. It will be good for you  I hope your husband feels better soon.

## 2023-06-01 ENCOUNTER — Encounter: Payer: Self-pay | Admitting: Internal Medicine

## 2023-06-01 NOTE — Assessment & Plan Note (Signed)
Minimal OSA based on last study. I discussed compliance goals for CPAP. She may decide it isn't worth it to her.

## 2023-06-01 NOTE — Assessment & Plan Note (Signed)
She can't afford maintenance controller. Plan- encouraged use of her nebulizer machine, two or three times daily instead of  a maintenance inhaler for now.

## 2023-06-12 ENCOUNTER — Encounter: Payer: Self-pay | Admitting: Internal Medicine

## 2023-07-03 ENCOUNTER — Encounter (INDEPENDENT_AMBULATORY_CARE_PROVIDER_SITE_OTHER): Payer: Self-pay | Admitting: *Deleted

## 2023-07-10 ENCOUNTER — Ambulatory Visit (INDEPENDENT_AMBULATORY_CARE_PROVIDER_SITE_OTHER): Payer: Medicare Other | Admitting: Gastroenterology

## 2023-07-10 ENCOUNTER — Encounter (INDEPENDENT_AMBULATORY_CARE_PROVIDER_SITE_OTHER): Payer: Self-pay | Admitting: Gastroenterology

## 2023-07-10 VITALS — BP 122/80 | HR 62 | Temp 98.0°F | Ht 65.0 in | Wt 190.2 lb

## 2023-07-10 DIAGNOSIS — R1013 Epigastric pain: Secondary | ICD-10-CM

## 2023-07-10 DIAGNOSIS — K760 Fatty (change of) liver, not elsewhere classified: Secondary | ICD-10-CM | POA: Insufficient documentation

## 2023-07-10 DIAGNOSIS — R748 Abnormal levels of other serum enzymes: Secondary | ICD-10-CM | POA: Insufficient documentation

## 2023-07-10 DIAGNOSIS — R197 Diarrhea, unspecified: Secondary | ICD-10-CM | POA: Diagnosis not present

## 2023-07-10 MED ORDER — POLYETHYLENE GLYCOL 3350 17 G PO PACK: 17.0000 g | PACK | Freq: Two times a day (BID) | ORAL | 0 refills | Status: AC

## 2023-07-10 NOTE — H&P (View-Only) (Signed)
[name] , M.D. Gastroenterology & Hepatology [facility] [address] [place], [place] [address] Primary Care Physician: Richmond Campbell., PA-C [address] [place] [address]  Chief Complaint: Altered bowel movements, abdominal pain and elevated liver enzymes  History of Present Illness: [name] is a 62 y.o. female with epilepsy, COPD, CVA, rheumatoid arthritis on adalimumab who presents for evaluation of Altered bowel movements, abdominal pain and elevated liver enzymes.  Patient reports that most of the day she was having a liquid bowel movement followed by hard stools every 1 to 2 days, with sense of incomplete evacuation.  Reports her stools are yellow in color liquid but denies any blood in stool.  Patient has postprandial abdominal pain which is new since past 6 months.  Although her complaints are not much related to abdominal pain rather diarrhea.  Patient reports that she has not started adalimumab or any new medications as of yet.  Last took leflunomide 6 months ago, taking intermittently prednisone.  Patient denies any herbal use or any new medications at  The patient denies having any nausea, vomiting, fever, chills, hematochezia, melena, hematemesis, abdominal distention, abdominal pain,  jaundice, pruritus or weight loss.  Last ION:GEXB Last Colonoscopy: 2021 normal colonoscopy suggest repeat 10 years  Liver enzymes from 06/21/2023 ALT 69 AST 41 alk phos 92 T. bili 0.6 FHx: neg for any gastrointestinal/liver disease, no malignancies Social: neg smoking, alcohol or illicit drug use Surgical: Appendectomy, cholecystectomy, hernia repair, hysterectomy  Past Medical History: Past Medical History:  Diagnosis Date   Allergic rhinitis    uses Flonase daily as needed and takes CLaritin daily   Anxiety    takes Xanax daily as needed   Asthma    Albuterol inhaler prn;SYmbicort daily   Cerebral vascular  malformation    sees dr Lovell Sheehan for monitoring as needed, sees dr lewitt for headaches every 4 months   COPD (chronic obstructive pulmonary disease) (HCC)    Depression    takes Citalopram daily   Diverticulitis at age 51   Eczema    Fibromyalgia    GERD (gastroesophageal reflux disease)    takes Protonix daily   Hard of hearing    History of kidney stones    History of migraine    last one 10+yrs ago   History of staph infection 92yrs ago   Hyperlipidemia    takes Pravastatin daily   IBS (irritable bowel syndrome)    mixed   Insomnia    Joint pain    Lactose intolerance    Nausea    takes Zofran daily as needed   PFO (patent foramen ovale)    Plaque psoriasis    PONV (postoperative nausea and vomiting)    Postoperative anemia due to acute blood loss 02/08/2016   Primary localized osteoarthritis of left knee    Primary localized osteoarthritis of right knee 11/03/2015   Rheumatoid arthritis(714.0) 10/16/2008   oa and ra;Rhemicade IV every 6wks and Metotrexate weekly   Seizures (HCC) 6months ago 03/21/14   takes Depakote daily   Shortness of breath    with exertion   Sleep apnea    study done >70yrs ago;uses CPAP nightly   Spinal headache    patient states that she thinks she had a spinal headache a long time ago   Stress incontinence    Stroke (HCC) 03/26/2013   left sided weakness   Thyroid cyst     Past Surgical History: Past  Surgical History:  Procedure Laterality Date   APPENDECTOMY  1981   BIOPSY  12/16/2019   Procedure: BIOPSY;  Surgeon: Charna Elizabeth, MD;  Location: [facility]OS[facility];  Service: Endoscopy;;   CARDIAC CATHETERIZATION  2004   CHOLECYSTECTOMY  1981   COLONOSCOPY     COLONOSCOPY WITH PROPOFOL N/A 12/16/2019   Procedure: COLONOSCOPY WITH PROPOFOL;  Surgeon: Charna Elizabeth, MD;  Location: [facility] ENDOSCOPY;  Service: Endoscopy;  Laterality: N/A;   CORONARY PRESSURE/FFR STUDY N/A 07/25/2022   Procedure: INTRAVASCULAR PRESSURE WIRE/FFR STUDY;  Surgeon: Elder Negus, MD;  Location: [facility];  Service: Cardiovascular;  Laterality: N/A;   ESOPHAGOGASTRODUODENOSCOPY     FOOT SURGERY  1999   right ankle   HERNIA REPAIR  2006   x2   INCISIONAL HERNIA REPAIR  05/20/2012   Procedure: HERNIA REPAIR INCISIONAL;  Surgeon: Clovis Pu. Cornett, MD;  Location: [facility] ORS;  Service: General;  Laterality: N/A;   INCONTINENCE SURGERY  2010   sling done    KIDNEY STONE SURGERY  2001   KNEE ARTHROSCOPY  1992   left   left foot plating and scarping for arthritis  2011   LEFT HEART CATH AND CORONARY ANGIOGRAPHY N/A 07/25/2022   Procedure: LEFT HEART CATH AND CORONARY ANGIOGRAPHY;  Surgeon: Elder Negus, MD;  Location: [facility];  Service: Cardiovascular;  Laterality: N/A;   right ear tube insertion  2011   TEE WITHOUT CARDIOVERSION N/A 05/15/2013   Procedure: TRANSESOPHAGEAL ECHOCARDIOGRAM (TEE);  Surgeon: Pamella Pert, MD;  [facility] END[facility]COPY;  Service: Cardiovascular;  Laterality: N/A;   thryoid biopsy     TONSILLECTOMY AND ADENOIDECTOMY  04/01/2014   TONSILLECTOMY AND ADENOIDECTOMY Bilateral 04/01/2014   Procedure: BILATERAL TONSILLECTOMY AND ADENOIDECTOMY;  Surgeon: Darletta Moll, MD;  Location: [facility] OR;  Service: ENT;  Laterality: Bilateral;   TOTAL ABDOMINAL HYSTERECTOMY  2001   TOTAL KNEE ARTHROPLASTY Right 11/03/2015   TOTAL KNEE ARTHROPLASTY Right 11/03/2015   Procedure: RIGHT TOTAL KNEE ARTHROPLASTY/RIGHT;  Surgeon: Salvatore Marvel, MD;  Location: [facility] OR;  Service: Orthopedics;  Laterality: Right;   TOTAL KNEE ARTHROPLASTY Left 02/07/2016   Procedure: TOTAL KNEE ARTHROPLASTY;  Surgeon: Salvatore Marvel, MD;  Location:  [facility] OR;  Service: Orthopedics;  Laterality: Left;   TUBAL LIGATION  1990    Family History: Family History  Problem Relation Age of Onset   Ovarian cancer Mother    Diabetes Maternal Grandmother    Arthritis Maternal Grandmother    Diabetes Brother    Diabetes Paternal Grandmother    Diabetes Maternal Grandfather     Diabetes Paternal Grandfather    Heart disease Brother    Heart disease Other        Uncle   Breast cancer Maternal Aunt     Social History: Social History   Tobacco Use  Smoking Status Former   Current packs/day: 0.00   Average packs/day: 2.0 packs/day for 30.0 years (60.0 ttl pk-yrs)   Types: Cigarettes   Start date: 10/19/1969   Quit date: 10/20/1999   Years since quitting: 23.7  Smokeless Tobacco Never   Social History   Substance and Sexual Activity  Alcohol Use Yes   Comment: occ   Social History   Substance and Sexual Activity  Drug Use No    Allergies: Allergies  Allergen Reactions   Aquacel [Carboxymethylcellulose] Other (See Comments)    Blisters    Dextromethorphan Anaphylaxis   Nickel Rash    Turn read   Sulfonamide Derivatives  Anaphylaxis, Hives and Swelling    REACTION: swelling, tongue and hives   Aspirin Nausea And Vomiting    REACTION: severe gi upset.  Pt states she can tolerate 81 mg asa only.    Ciprofloxacin Itching and Swelling    REACTION: angioedema, urticaria   Cosyntropin Hives and Swelling    REACTION: swelling, hives   Adhesive [Tape] Other (See Comments)    Blisters skin   Amoxicillin Nausea And Vomiting    REACTION: GI upset   Latex Rash    Blisters   Moxifloxacin Other (See Comments)    gi upset   Nsaids Other (See Comments)    Gi upset    Medications: Current Outpatient Medications  Medication Sig Dispense Refill   albuterol (VENTOLIN HFA) 108 (90 Base) MCG/ACT inhaler Inhale 1-2 puffs into the lungs every 6 (six) hours as needed for wheezing or shortness of breath.     ALPRAZolam (XANAX) 0.5 MG tablet Take 0.5 mg by mouth at bedtime.     amLODipine (NORVASC) 5 MG tablet TAKE 1 TABLET BY MOUTH DAILY 100 tablet 2   aspirin EC 81 MG tablet Take 81 mg by mouth at bedtime.     azelastine (OPTIVAR) 0.05 % ophthalmic solution 1 drop 2 (two) times daily.     Calcium-Phosphorus-Vitamin D (CITRACAL +D3 PO) Take 1 tablet by  mouth 2 (two) times daily.     ipratropium (ATROVENT) 0.03 % nasal spray Place 2 sprays into the nose in the morning and at bedtime.     nitroGLYCERIN (NITROSTAT) 0.4 MG SL tablet Place 1 tablet (0.4 mg total) under the tongue every 5 (five) minutes as needed for chest pain. 25 tablet 3   OVER THE COUNTER MEDICATION Bio True eye drops     polyethylene glycol (MIRALAX / GLYCOLAX) 17 g packet Take 17 g by mouth 2 (two) times daily. 180 packet 0   PRESCRIPTION MEDICATION Methylprednisolone 4mg  6 day dose pack (will finish 07/12/23)     rosuvastatin (CRESTOR) 40 MG tablet Take 1 tablet (40 mg total) by mouth daily. 90 tablet 3   topiramate (TOPAMAX) 100 MG tablet Take 100 mg by mouth 2 (two) times daily.     triamcinolone cream (KENALOG) 0.1 % Apply 1 application topically 2 (two) times daily as needed (skin irritation.).      No current facility-administered medications for this visit.    Review of Systems: GENERAL: negative for malaise, night sweats HEENT: No changes in hearing or vision, no nose bleeds or other nasal problems. NECK: Negative for lumps, goiter, pain and significant neck swelling RESPIRATORY: Negative for cough, wheezing CARDIOVASCULAR: Negative for chest pain, leg swelling, palpitations, orthopnea GI: SEE HPI MUSCULOSKELETAL: Negative for joint pain or swelling, back pain, and muscle pain. SKIN: Negative for lesions, rash HEMATOLOGY Negative for prolonged bleeding, bruising easily, and swollen nodes. ENDOCRINE: Negative for cold or heat intolerance, polyuria, polydipsia and goiter. NEURO: negative for tremor, gait imbalance, syncope and seizures. The remainder of the review of systems is noncontributory.   Physical Exam: BP 122/80 (BP Location: Left Arm, Patient Position: Sitting, Cuff Size: Large)   Pulse 62   Temp 98 F (36.7 C) (Oral)   Ht 5\' 5"  (1.651 m)   Wt 190 lb 3.2 oz (86.3 kg)   BMI 31.65 kg/m  GENERAL: The patient is AO x3, in no acute distress. HEENT:  Head is normocephalic and atraumatic. EOMI are intact. Mouth is well hydrated and without lesions. NECK: Supple. No masses LUNGS: Clear to  auscultation. No presence of rhonchi/wheezing/rales. Adequate chest expansion HEART: RRR, normal s1 and s2. ABDOMEN: Soft, nontender, no guarding, no peritoneal signs, and nondistended. BS +. No masses. EXTREMITIES: Without any cyanosis, clubbing, rash, lesions or edema. NEUROLOGIC: AOx3, no focal motor deficit. SKIN: no jaundice, no rashes   Imaging/Labs: as above     Latest Ref Rng & Units 07/24/2022   11:02 AM 04/26/2018    9:59 AM 02/10/2016    5:22 AM  CBC  WBC 3.4 - 10.8 x10E3/uL 5.4  7.9  9.1   Hemoglobin 11.1 - 15.9 g/dL 16.1  09.6  04.5   Hematocrit 34.0 - 46.6 % 44.2  39.9  32.5   Platelets 150 - 450 x10E3/uL 176  198  110    No results found for: "IRON", "TIBC", "FERRITIN"  I personally reviewed and interpreted the available labs, imaging and endoscopic files.    07/04/2023 IMPRESSION:  1. Status post cholecystectomy.  2. Increased hepatic parenchymal echogenicity suggestive of  steatosis.   Negative celiac panel 2022 Impression and Plan:  [name] is a 62 y.o. female with epilepsy, COPD, CVA, rheumatoid arthritis on adalimumab who presents for evaluation of Altered bowel movements, abdominal pain and elevated liver enzymes.  # Elevated liver enzymes Hepatocellular pattern liver injury  This could be DILI (drug-induced liver injury) but most likely etiology is MASLD-metabolic dysfunction associated steatotic liver disease  Patient's medication reviewed .  She has not started adalimumab but was previously on leflunomide which was stopped 6 months ago  Leflunomide likelihood score: B (well known cause of idiosyncratic clinically apparent liver injury as well as reactivation of hepatitis B).  Adalimumab Likelihood score: B (highly likely cause of clinically apparent liver injury).  Rosuvastatin Likelihood score: A  (likely cause of clinically apparent liver injury).  Topiramate Likelihood score: C (probable rare cause of clinically apparent liver injury).  At this time I would not stop the statin or hold on new onset upper GI symptoms patient Humira, with close monitoring of her liver enzymes and if significant increase than would change medications  At this time we will obtain repeat liver enzymes in 1 to 2 months with baseline autoimmune hepatitis serology given patient has autoimmune disease(rheumatoid arthritis).  Will also obtain viral hepatitis profile  #MASLD  Risk factors for MASLD with  BMI 31 and prediabetes hemoglobin A1c 5.8 and mixed hyperlipidemia   Recommendation :     - walking at a brisk pace/biking at moderate intesity 2.5-5 hours per week     - use pedometer/step counter to track activity     - goal to lose 5-10% of initial body weight     - avoid suagry drinks and juices, use zero calorie beverages     - increase water intake     - eat a low carb diet with plenty of veggies and fruit     - Get sufficient sleep 7-8 hrs nightly     - maitain active lifestyle     - avoid alcohol     - recommend 2-3 cups Coffee daily     - Counsel on lowering cholesterol by having a diet rich in vegetables,          protein (avoid red meats) and good fats(fish, salmon).  #Dyspepsia   Patient has new onset epigastric discomfort postprandial, this could be acid mediated or H. pylori mediated dyspepsia. But given new onset upper GI symptoms and patient age more than 62, ACG recommends upper endoscopy  #Alternating  Bowel movements Patient mostly has diarrhea, with incomplete sense of evacuation altered with hard stools.  This is likely overflow diarrhea and less likely IBS-D as pain is not the major component here  Will obtain baseline abdominal x-ray to evaluate for stool burden, if large amount is encountered would benefit from bowel purge Ensure adequate fluid intake: Aim for 8 glasses of water  daily. Follow a high fiber diet: Include foods such as dates, prunes, pears, and kiwi. Take Miralax twice a day for the first week, then reduce to once daily thereafter.  HCM: Last colonoscopy 2021, suggest repeat 10 years   All questions were answered.      Gwendolyn Lawman, MD Gastroenterology and Hepatology [facility]   This chart has been completed using [facility] Dictation software, and while attempts have been made to ensure accuracy , certain words and phrases may not be transcribed as intended

## 2023-07-10 NOTE — Progress Notes (Signed)
[name] , M.D. Gastroenterology & Hepatology [facility] [address] [place], [place] [address] Primary Care Physician: Richmond Campbell., PA-C [address] [place] [address]  Chief Complaint: Altered bowel movements, abdominal pain and elevated liver enzymes  History of Present Illness: [name] is a 62 y.o. female with epilepsy, COPD, CVA, rheumatoid arthritis on adalimumab who presents for evaluation of Altered bowel movements, abdominal pain and elevated liver enzymes.  Patient reports that most of the day she was having a liquid bowel movement followed by hard stools every 1 to 2 days, with sense of incomplete evacuation.  Reports her stools are yellow in color liquid but denies any blood in stool.  Patient has postprandial abdominal pain which is new since past 6 months.  Although her complaints are not much related to abdominal pain rather diarrhea.  Patient reports that she has not started adalimumab or any new medications as of yet.  Last took leflunomide 6 months ago, taking intermittently prednisone.  Patient denies any herbal use or any new medications at  The patient denies having any nausea, vomiting, fever, chills, hematochezia, melena, hematemesis, abdominal distention, abdominal pain,  jaundice, pruritus or weight loss.  Last ION:GEXB Last Colonoscopy: 2021 normal colonoscopy suggest repeat 10 years  Liver enzymes from 06/21/2023 ALT 69 AST 41 alk phos 92 T. bili 0.6 FHx: neg for any gastrointestinal/liver disease, no malignancies Social: neg smoking, alcohol or illicit drug use Surgical: Appendectomy, cholecystectomy, hernia repair, hysterectomy  Past Medical History: Past Medical History:  Diagnosis Date   Allergic rhinitis    uses Flonase daily as needed and takes CLaritin daily   Anxiety    takes Xanax daily as needed   Asthma    Albuterol inhaler prn;SYmbicort daily   Cerebral vascular  malformation    sees dr Lovell Sheehan for monitoring as needed, sees dr lewitt for headaches every 4 months   COPD (chronic obstructive pulmonary disease) (HCC)    Depression    takes Citalopram daily   Diverticulitis at age 51   Eczema    Fibromyalgia    GERD (gastroesophageal reflux disease)    takes Protonix daily   Hard of hearing    History of kidney stones    History of migraine    last one 10+yrs ago   History of staph infection 92yrs ago   Hyperlipidemia    takes Pravastatin daily   IBS (irritable bowel syndrome)    mixed   Insomnia    Joint pain    Lactose intolerance    Nausea    takes Zofran daily as needed   PFO (patent foramen ovale)    Plaque psoriasis    PONV (postoperative nausea and vomiting)    Postoperative anemia due to acute blood loss 02/08/2016   Primary localized osteoarthritis of left knee    Primary localized osteoarthritis of right knee 11/03/2015   Rheumatoid arthritis(714.0) 10/16/2008   oa and ra;Rhemicade IV every 6wks and Metotrexate weekly   Seizures (HCC) 6months ago 03/21/14   takes Depakote daily   Shortness of breath    with exertion   Sleep apnea    study done >70yrs ago;uses CPAP nightly   Spinal headache    patient states that she thinks she had a spinal headache a long time ago   Stress incontinence    Stroke (HCC) 03/26/2013   left sided weakness   Thyroid cyst     Past Surgical History: Past  Surgical History:  Procedure Laterality Date   APPENDECTOMY  1981   BIOPSY  12/16/2019   Procedure: BIOPSY;  Surgeon: Charna Elizabeth, MD;  Location: [facility] ENDOSCOPY;  Service: Endoscopy;;   CARDIAC CATHETERIZATION  2004   CHOLECYSTECTOMY  1981   COLONOSCOPY     COLONOSCOPY WITH PROPOFOL N/A 12/16/2019   Procedure: COLONOSCOPY WITH PROPOFOL;  Surgeon: Charna Elizabeth, MD;  Location: [facility] ENDOSCOPY;  Service: Endoscopy;  Laterality: N/A;   CORONARY PRESSURE/FFR STUDY N/A 07/25/2022   Procedure: INTRAVASCULAR PRESSURE WIRE/FFR STUDY;  Surgeon: Elder Negus, MD;  Location: [facility] INVASIVE CV LAB;  Service: Cardiovascular;  Laterality: N/A;   ESOPHAGOGASTRODUODENOSCOPY     FOOT SURGERY  1999   right ankle   HERNIA REPAIR  2006   x2   INCISIONAL HERNIA REPAIR  05/20/2012   Procedure: HERNIA REPAIR INCISIONAL;  Surgeon: Clovis Pu. Cornett, MD;  Location: [facility] ORS;  Service: General;  Laterality: N/A;   INCONTINENCE SURGERY  2010   sling done    KIDNEY STONE SURGERY  2001   KNEE ARTHROSCOPY  1992   left   left foot plating and scarping for arthritis  2011   LEFT HEART CATH AND CORONARY ANGIOGRAPHY N/A 07/25/2022   Procedure: LEFT HEART CATH AND CORONARY ANGIOGRAPHY;  Surgeon: Elder Negus, MD;  Location: [facility] INV[facility] CV LAB;  Service: Cardiovascular;  Laterality: N/A;   right ear tube insertion  2011   TEE WITHOUT CARDIOVERSION N/A 05/15/2013   Procedure: TRANSESOPHAGEAL ECHOCARDIOGRAM (TEE);  Surgeon: Pamella Pert, MD;  [facility] END[facility]COPY;  Service: Cardiovascular;  Laterality: N/A;   thryoid biopsy     TONSILLECTOMY AND ADENOIDECTOMY  04/01/2014   TONSILLECTOMY AND ADENOIDECTOMY Bilateral 04/01/2014   Procedure: BILATERAL TONSILLECTOMY AND ADENOIDECTOMY;  Surgeon: Darletta Moll, MD;  Location: [facility] OR;  Service: ENT;  Laterality: Bilateral;   TOTAL ABDOMINAL HYSTERECTOMY  2001   TOTAL KNEE ARTHROPLASTY Right 11/03/2015   TOTAL KNEE ARTHROPLASTY Right 11/03/2015   Procedure: RIGHT TOTAL KNEE ARTHROPLASTY/RIGHT;  Surgeon: Salvatore Marvel, MD;  Location: [facility] OR;  Service: Orthopedics;  Laterality: Right;   TOTAL KNEE ARTHROPLASTY Left 02/07/2016   Procedure: TOTAL KNEE ARTHROPLASTY;  Surgeon: Salvatore Marvel, MD;  Location:  [facility] OR;  Service: Orthopedics;  Laterality: Left;   TUBAL LIGATION  1990    Family History: Family History  Problem Relation Age of Onset   Ovarian cancer Mother    Diabetes Maternal Grandmother    Arthritis Maternal Grandmother    Diabetes Brother    Diabetes Paternal Grandmother    Diabetes Maternal Grandfather     Diabetes Paternal Grandfather    Heart disease Brother    Heart disease Other        Uncle   Breast cancer Maternal Aunt     Social History: Social History   Tobacco Use  Smoking Status Former   Current packs/day: 0.00   Average packs/day: 2.0 packs/day for 30.0 years (60.0 ttl pk-yrs)   Types: Cigarettes   Start date: 10/19/1969   Quit date: 10/20/1999   Years since quitting: 23.7  Smokeless Tobacco Never   Social History   Substance and Sexual Activity  Alcohol Use Yes   Comment: occ   Social History   Substance and Sexual Activity  Drug Use No    Allergies: Allergies  Allergen Reactions   Aquacel [Carboxymethylcellulose] Other (See Comments)    Blisters    Dextromethorphan Anaphylaxis   Nickel Rash    Turn read   Sulfonamide Derivatives  Anaphylaxis, Hives and Swelling    REACTION: swelling, tongue and hives   Aspirin Nausea And Vomiting    REACTION: severe gi upset.  Pt states she can tolerate 81 mg asa only.    Ciprofloxacin Itching and Swelling    REACTION: angioedema, urticaria   Cosyntropin Hives and Swelling    REACTION: swelling, hives   Adhesive [Tape] Other (See Comments)    Blisters skin   Amoxicillin Nausea And Vomiting    REACTION: GI upset   Latex Rash    Blisters   Moxifloxacin Other (See Comments)    gi upset   Nsaids Other (See Comments)    Gi upset    Medications: Current Outpatient Medications  Medication Sig Dispense Refill   albuterol (VENTOLIN HFA) 108 (90 Base) MCG/ACT inhaler Inhale 1-2 puffs into the lungs every 6 (six) hours as needed for wheezing or shortness of breath.     ALPRAZolam (XANAX) 0.5 MG tablet Take 0.5 mg by mouth at bedtime.     amLODipine (NORVASC) 5 MG tablet TAKE 1 TABLET BY MOUTH DAILY 100 tablet 2   aspirin EC 81 MG tablet Take 81 mg by mouth at bedtime.     azelastine (OPTIVAR) 0.05 % ophthalmic solution 1 drop 2 (two) times daily.     Calcium-Phosphorus-Vitamin D (CITRACAL +D3 PO) Take 1 tablet by  mouth 2 (two) times daily.     ipratropium (ATROVENT) 0.03 % nasal spray Place 2 sprays into the nose in the morning and at bedtime.     nitroGLYCERIN (NITROSTAT) 0.4 MG SL tablet Place 1 tablet (0.4 mg total) under the tongue every 5 (five) minutes as needed for chest pain. 25 tablet 3   OVER THE COUNTER MEDICATION Bio True eye drops     polyethylene glycol (MIRALAX / GLYCOLAX) 17 g packet Take 17 g by mouth 2 (two) times daily. 180 packet 0   PRESCRIPTION MEDICATION Methylprednisolone 4mg  6 day dose pack (will finish 07/12/23)     rosuvastatin (CRESTOR) 40 MG tablet Take 1 tablet (40 mg total) by mouth daily. 90 tablet 3   topiramate (TOPAMAX) 100 MG tablet Take 100 mg by mouth 2 (two) times daily.     triamcinolone cream (KENALOG) 0.1 % Apply 1 application topically 2 (two) times daily as needed (skin irritation.).      No current facility-administered medications for this visit.    Review of Systems: GENERAL: negative for malaise, night sweats HEENT: No changes in hearing or vision, no nose bleeds or other nasal problems. NECK: Negative for lumps, goiter, pain and significant neck swelling RESPIRATORY: Negative for cough, wheezing CARDIOVASCULAR: Negative for chest pain, leg swelling, palpitations, orthopnea GI: SEE HPI MUSCULOSKELETAL: Negative for joint pain or swelling, back pain, and muscle pain. SKIN: Negative for lesions, rash HEMATOLOGY Negative for prolonged bleeding, bruising easily, and swollen nodes. ENDOCRINE: Negative for cold or heat intolerance, polyuria, polydipsia and goiter. NEURO: negative for tremor, gait imbalance, syncope and seizures. The remainder of the review of systems is noncontributory.   Physical Exam: BP 122/80 (BP Location: Left Arm, Patient Position: Sitting, Cuff Size: Large)   Pulse 62   Temp 98 F (36.7 C) (Oral)   Ht 5\' 5"  (1.651 m)   Wt 190 lb 3.2 oz (86.3 kg)   BMI 31.65 kg/m  GENERAL: The patient is AO x3, in no acute distress. HEENT:  Head is normocephalic and atraumatic. EOMI are intact. Mouth is well hydrated and without lesions. NECK: Supple. No masses LUNGS: Clear to  auscultation. No presence of rhonchi/wheezing/rales. Adequate chest expansion HEART: RRR, normal s1 and s2. ABDOMEN: Soft, nontender, no guarding, no peritoneal signs, and nondistended. BS +. No masses. EXTREMITIES: Without any cyanosis, clubbing, rash, lesions or edema. NEUROLOGIC: AOx3, no focal motor deficit. SKIN: no jaundice, no rashes   Imaging/Labs: as above     Latest Ref Rng & Units 07/24/2022   11:02 AM 04/26/2018    9:59 AM 02/10/2016    5:22 AM  CBC  WBC 3.4 - 10.8 x10E3/uL 5.4  7.9  9.1   Hemoglobin 11.1 - 15.9 g/dL 16.1  09.6  04.5   Hematocrit 34.0 - 46.6 % 44.2  39.9  32.5   Platelets 150 - 450 x10E3/uL 176  198  110    No results found for: "IRON", "TIBC", "FERRITIN"  I personally reviewed and interpreted the available labs, imaging and endoscopic files.    07/04/2023 IMPRESSION:  1. Status post cholecystectomy.  2. Increased hepatic parenchymal echogenicity suggestive of  steatosis.   Negative celiac panel 2022 Impression and Plan:  [name] is a 62 y.o. female with epilepsy, COPD, CVA, rheumatoid arthritis on adalimumab who presents for evaluation of Altered bowel movements, abdominal pain and elevated liver enzymes.  # Elevated liver enzymes Hepatocellular pattern liver injury  This could be DILI (drug-induced liver injury) but most likely etiology is MASLD-metabolic dysfunction associated steatotic liver disease  Patient's medication reviewed .  She has not started adalimumab but was previously on leflunomide which was stopped 6 months ago  Leflunomide likelihood score: B (well known cause of idiosyncratic clinically apparent liver injury as well as reactivation of hepatitis B).  Adalimumab Likelihood score: B (highly likely cause of clinically apparent liver injury).  Rosuvastatin Likelihood score: A  (likely cause of clinically apparent liver injury).  Topiramate Likelihood score: C (probable rare cause of clinically apparent liver injury).  At this time I would not stop the statin or hold on new onset upper GI symptoms patient Humira, with close monitoring of her liver enzymes and if significant increase than would change medications  At this time we will obtain repeat liver enzymes in 1 to 2 months with baseline autoimmune hepatitis serology given patient has autoimmune disease(rheumatoid arthritis).  Will also obtain viral hepatitis profile  #MASLD  Risk factors for MASLD with  BMI 31 and prediabetes hemoglobin A1c 5.8 and mixed hyperlipidemia   Recommendation :     - walking at a brisk pace/biking at moderate intesity 2.5-5 hours per week     - use pedometer/step counter to track activity     - goal to lose 5-10% of initial body weight     - avoid suagry drinks and juices, use zero calorie beverages     - increase water intake     - eat a low carb diet with plenty of veggies and fruit     - Get sufficient sleep 7-8 hrs nightly     - maitain active lifestyle     - avoid alcohol     - recommend 2-3 cups Coffee daily     - Counsel on lowering cholesterol by having a diet rich in vegetables,          protein (avoid red meats) and good fats(fish, salmon).  #Dyspepsia   Patient has new onset epigastric discomfort postprandial, this could be acid mediated or H. pylori mediated dyspepsia. But given new onset upper GI symptoms and patient age more than 62, ACG recommends upper endoscopy  #Alternating  Bowel movements Patient mostly has diarrhea, with incomplete sense of evacuation altered with hard stools.  This is likely overflow diarrhea and less likely IBS-D as pain is not the major component here  Will obtain baseline abdominal x-ray to evaluate for stool burden, if large amount is encountered would benefit from bowel purge Ensure adequate fluid intake: Aim for 8 glasses of water  daily. Follow a high fiber diet: Include foods such as dates, prunes, pears, and kiwi. Take Miralax twice a day for the first week, then reduce to once daily thereafter.  HCM: Last colonoscopy 2021, suggest repeat 10 years   All questions were answered.      [name], MD Gastroenterology and Hepatology [facility]   This chart has been completed using [facility] Dictation software, and while attempts have been made to ensure accuracy , certain words and phrases may not be transcribed as intended

## 2023-07-10 NOTE — Patient Instructions (Addendum)
It was very nice to meet you today, as dicussed with will plan for the following :  1) Blood work in 1 -2 months  2) Upper endoscopy  3)Ensure adequate fluid intake: Aim for 8 glasses of water daily. Follow a high fiber diet: Include foods such as dates, prunes, pears, and kiwi. Take Miralax twice a day for the first week, then reduce to once daily thereafter.

## 2023-07-12 ENCOUNTER — Ambulatory Visit (HOSPITAL_COMMUNITY)
Admission: RE | Admit: 2023-07-12 | Discharge: 2023-07-12 | Disposition: A | Discharge status: Home / Self Care | Payer: Medicare Other | Source: Ambulatory Visit | Attending: Gastroenterology | Admitting: Gastroenterology

## 2023-07-12 ENCOUNTER — Encounter (INDEPENDENT_AMBULATORY_CARE_PROVIDER_SITE_OTHER): Payer: Self-pay

## 2023-07-12 DIAGNOSIS — R197 Diarrhea, unspecified: Secondary | ICD-10-CM | POA: Diagnosis present

## 2023-07-17 ENCOUNTER — Encounter (HOSPITAL_COMMUNITY)
Admission: RE | Admit: 2023-07-17 | Discharge: 2023-07-17 | Disposition: A | Discharge status: Home / Self Care | Payer: Medicare Other | Source: Ambulatory Visit | Attending: Gastroenterology | Admitting: Gastroenterology

## 2023-07-19 ENCOUNTER — Encounter (HOSPITAL_COMMUNITY)
Admission: RE | Disposition: A | Discharge status: Home / Self Care | Payer: Self-pay | Source: Home / Self Care | Attending: Gastroenterology

## 2023-07-19 ENCOUNTER — Ambulatory Visit (HOSPITAL_COMMUNITY): Payer: Medicare Other | Admitting: Anesthesiology

## 2023-07-19 ENCOUNTER — Encounter (HOSPITAL_COMMUNITY): Payer: Self-pay

## 2023-07-19 ENCOUNTER — Ambulatory Visit (HOSPITAL_COMMUNITY)
Admission: RE | Admit: 2023-07-19 | Discharge: 2023-07-19 | Disposition: A | Discharge status: Home / Self Care | Payer: Medicare Other | Attending: Gastroenterology | Admitting: Gastroenterology

## 2023-07-19 DIAGNOSIS — R1013 Epigastric pain: Secondary | ICD-10-CM | POA: Diagnosis not present

## 2023-07-19 DIAGNOSIS — Z87891 Personal history of nicotine dependence: Secondary | ICD-10-CM | POA: Insufficient documentation

## 2023-07-19 DIAGNOSIS — K2970 Gastritis, unspecified, without bleeding: Secondary | ICD-10-CM

## 2023-07-19 DIAGNOSIS — K219 Gastro-esophageal reflux disease without esophagitis: Secondary | ICD-10-CM | POA: Diagnosis not present

## 2023-07-19 DIAGNOSIS — G4730 Sleep apnea, unspecified: Secondary | ICD-10-CM | POA: Insufficient documentation

## 2023-07-19 DIAGNOSIS — K30 Functional dyspepsia: Secondary | ICD-10-CM | POA: Diagnosis present

## 2023-07-19 DIAGNOSIS — M069 Rheumatoid arthritis, unspecified: Secondary | ICD-10-CM | POA: Insufficient documentation

## 2023-07-19 DIAGNOSIS — K589 Irritable bowel syndrome without diarrhea: Secondary | ICD-10-CM | POA: Diagnosis not present

## 2023-07-19 DIAGNOSIS — J4489 Other specified chronic obstructive pulmonary disease: Secondary | ICD-10-CM | POA: Insufficient documentation

## 2023-07-19 DIAGNOSIS — M797 Fibromyalgia: Secondary | ICD-10-CM | POA: Diagnosis not present

## 2023-07-19 DIAGNOSIS — J449 Chronic obstructive pulmonary disease, unspecified: Secondary | ICD-10-CM | POA: Diagnosis not present

## 2023-07-19 DIAGNOSIS — K317 Polyp of stomach and duodenum: Secondary | ICD-10-CM

## 2023-07-19 DIAGNOSIS — G40909 Epilepsy, unspecified, not intractable, without status epilepticus: Secondary | ICD-10-CM | POA: Insufficient documentation

## 2023-07-19 DIAGNOSIS — K2980 Duodenitis without bleeding: Secondary | ICD-10-CM | POA: Diagnosis not present

## 2023-07-19 HISTORY — PX: POLYPECTOMY: SHX5525

## 2023-07-19 HISTORY — PX: BIOPSY: SHX5522

## 2023-07-19 HISTORY — PX: ESOPHAGOGASTRODUODENOSCOPY (EGD) WITH PROPOFOL: SHX5813

## 2023-07-19 SURGERY — ESOPHAGOGASTRODUODENOSCOPY (EGD) WITH PROPOFOL
Anesthesia: General

## 2023-07-19 MED ORDER — LIDOCAINE HCL (CARDIAC) PF 100 MG/5ML IV SOSY
PREFILLED_SYRINGE | INTRAVENOUS | Status: DC | PRN
  Administered 2023-07-19: 100 mg via INTRAVENOUS

## 2023-07-19 MED ORDER — LACTATED RINGERS IV SOLN
INTRAVENOUS | Status: DC | PRN
Start: 2023-07-19 — End: 2023-07-19

## 2023-07-19 MED ORDER — PROPOFOL 10 MG/ML IV BOLUS
INTRAVENOUS | Status: DC | PRN
Start: 2023-07-19 — End: 2023-07-19
  Administered 2023-07-19: 50 mg via INTRAVENOUS
  Administered 2023-07-19: 20 mg via INTRAVENOUS
  Administered 2023-07-19: 80 mg via INTRAVENOUS

## 2023-07-19 MED ORDER — PHENYLEPHRINE 80 MCG/ML (10ML) SYRINGE FOR IV PUSH (FOR BLOOD PRESSURE SUPPORT)
PREFILLED_SYRINGE | INTRAVENOUS | Status: DC | PRN
  Administered 2023-07-19 (×2): 160 ug via INTRAVENOUS

## 2023-07-19 MED ORDER — PROPOFOL 500 MG/50ML IV EMUL
INTRAVENOUS | Status: DC | PRN
  Administered 2023-07-19: 150 ug/kg/min via INTRAVENOUS

## 2023-07-19 MED ORDER — SUCRALFATE 1 G PO TABS: 1.0000 g | ORAL_TABLET | Freq: Three times a day (TID) | ORAL | 1 refills | Status: DC

## 2023-07-19 MED ORDER — PANTOPRAZOLE SODIUM 40 MG PO TBEC: 40.0000 mg | DELAYED_RELEASE_TABLET | Freq: Every day | ORAL | 1 refills | Status: DC

## 2023-07-19 NOTE — Discharge Instructions (Signed)

## 2023-07-19 NOTE — Op Note (Signed)
[facility] Patient Name: [name] Procedure Date: 07/19/2023 11:08 AM MRN: [id number] Date of Birth: May 18, 1961 Attending MD: Sanjuan Dame , MD, [id number] CSN: [id number] Age: 62 Admit Type: Outpatient Procedure:                Upper GI endoscopy Indications:              Functional Dyspepsia Providers:                Sanjuan Dame, MD, Buel Ream. Thomasena Edis RN, RN,                            Elinor Parkinson Referring MD:              Medicines:                Monitored Anesthesia Care Complications:            No immediate complications. Estimated Blood Loss:     Estimated blood loss was minimal. Procedure:                Pre-Anesthesia Assessment:                           - Prior to the procedure, a History and Physical                            was performed, and patient medications and                            allergies were reviewed. The patient's tolerance of                            previous anesthesia was also reviewed. The risks                            and benefits of the procedure and the sedation                            options and risks were discussed with the patient.                            All questions were answered, and informed consent                            was obtained. Prior Anticoagulants: The patient has                            taken no anticoagulant or antiplatelet agents                            except for aspirin. ASA Grade Assessment: III - A                            patient with severe systemic disease. After  reviewing the risks and benefits, the patient was                            deemed in satisfactory condition to undergo the                            procedure.                           After obtaining informed consent, the endoscope was                            passed under direct vision. Throughout the                            procedure, the patient's blood pressure, pulse, and                             oxygen saturations were monitored continuously. The                            [id number]-H190 ([id number]) scope was introduced through the                            mouth, and advanced to the second part of duodenum.                            The upper GI endoscopy was accomplished without                            difficulty. The patient tolerated the procedure                            well. Scope In: 11:25:53 AM Scope Out: 11:37:51 AM Total Procedure Duration: 0 hours 11 minutes 58 seconds  Findings:      The examined esophagus was normal.      Mild inflammation characterized by erosions was found in the stomach.       Biopsies were taken with a cold forceps for histology.      Mildly erythematous mucosa without active bleeding and with no stigmata       of bleeding was found in the duodenal bulb and in the second portion of       the duodenum. Biopsies were taken with a cold forceps for histology.      A single 5 mm sessile polyp with no bleeding and no stigmata of recent       bleeding was found in the stomach. The polyp was removed with a cold       snare. Resection and retrieval were complete. Impression:               - Normal esophagus.                           - Gastritis. Biopsied.                           -  Erythematous duodenopathy. Biopsied.                           - A single gastric polyp. Resected and retrieved.                           -Bile reflex seen Moderate Sedation:      Per Anesthesia Care Recommendation:           - Patient has a contact number available for                            emergencies. The signs and symptoms of potential                            delayed complications were discussed with the                            patient. Return to normal activities tomorrow.                            Written discharge instructions were provided to the                            patient.                           - Resume  previous diet.                           - Continue present medications.                           - Await pathology results. Procedure Code(s):        --- Professional ---                           [phone], Esophagogastroduodenoscopy, flexible,                            transoral; with removal of tumor(s), polyp(s), or                            other lesion(s) by snare technique                           43239, 59, Esophagogastroduodenoscopy, flexible,                            transoral; with biopsy, single or multiple Diagnosis Code(s):        --- Professional ---                           K29.70, Gastritis, unspecified, without bleeding                           K31.89, Other diseases of stomach and duodenum  K31.7, Polyp of stomach and duodenum                           K30, Functional dyspepsia CPT copyright 2022 American Medical Association. All rights reserved. The codes documented in this report are preliminary and upon coder review may  be revised to meet current compliance requirements. Sanjuan Dame, MD Sanjuan Dame, MD 07/19/2023 11:45:25 AM This report has been signed electronically. Number of Addenda: 0

## 2023-07-19 NOTE — Interval H&P Note (Signed)
History and Physical Interval Note:  07/19/2023 11:10 AM  [name]  has presented today for surgery, with the diagnosis of dyspepsia.  The various methods of treatment have been discussed with the patient and family. After consideration of risks, benefits and other options for treatment, the patient has consented to  Procedure(s) with comments: ESOPHAGOGASTRODUODENOSCOPY (EGD) WITH PROPOFOL (N/A) - 12:45;asa 1-2, bumped to 1:00 for lunch to fit - [employer] as a surgical intervention.  The patient's history has been reviewed, patient examined, no change in status, stable for surgery.  I have reviewed the patient's chart and labs.  Questions were answered to the patient's satisfaction.     Juanetta Beets Errick Salts

## 2023-07-19 NOTE — Anesthesia Preprocedure Evaluation (Signed)
Anesthesia Evaluation  Patient identified by MRN, date of birth, ID band Patient awake    Reviewed: Allergy & Precautions, H&P , NPO status , Patient's Chart, lab work & pertinent test results, reviewed documented beta blocker date and time   History of Anesthesia Complications (+) PONV, POST - OP SPINAL HEADACHE and history of anesthetic complications  Airway Mallampati: II  TM Distance: >3 FB Neck ROM: full    Dental no notable dental hx. (+) Dental Advisory Given   Pulmonary shortness of breath, asthma , sleep apnea , COPD, former smoker   Pulmonary exam normal breath sounds clear to auscultation       Cardiovascular Exercise Tolerance: Good negative cardio ROS Normal cardiovascular exam Rhythm:regular Rate:Normal  PFO.   Neuro/Psych  Headaches, Seizures -,  PSYCHIATRIC DISORDERS Anxiety Depression    CVA    GI/Hepatic Neg liver ROS,GERD  ,,  Endo/Other  negative endocrine ROS    Renal/GU negative Renal ROS  negative genitourinary   Musculoskeletal   Abdominal   Peds  Hematology negative hematology ROS (+)   Anesthesia Other Findings   Reproductive/Obstetrics negative OB ROS                             Anesthesia Physical Anesthesia Plan  ASA: 3  Anesthesia Plan: General   Post-op Pain Management: Minimal or no pain anticipated   Induction: Intravenous  PONV Risk Score and Plan: Propofol infusion  Airway Management Planned: Nasal Cannula and Natural Airway  Additional Equipment: None  Intra-op Plan:   Post-operative Plan:   Informed Consent: I have reviewed the patients History and Physical, chart, labs and discussed the procedure including the risks, benefits and alternatives for the proposed anesthesia with the patient or authorized representative who has indicated his/her understanding and acceptance.     Dental Advisory Given  Plan Discussed with:  CRNA  Anesthesia Plan Comments:        Anesthesia Quick Evaluation

## 2023-07-19 NOTE — Transfer of Care (Signed)
Immediate Anesthesia Transfer of Care Note  Patient: [name]  Procedure(s) Performed: ESOPHAGOGASTRODUODENOSCOPY (EGD) WITH PROPOFOL BIOPSY POLYPECTOMY  Patient Location: Short Stay  Anesthesia Type:General  Level of Consciousness: drowsy and patient cooperative  Airway & Oxygen Therapy: Patient Spontanous Breathing and Patient connected to nasal cannula oxygen  Post-op Assessment: Report given to RN and Post -op Vital signs reviewed and stable  Post vital signs: Reviewed and stable  Last Vitals:  Vitals Value Taken Time  BP 103/58 07/19/23 1143  Temp 36.6 C 07/19/23 1143  Pulse 55 07/19/23 1143  Resp 15 07/19/23 1143  SpO2 97 % 07/19/23 1143    Last Pain:  Vitals:   07/19/23 1143  TempSrc: Oral  PainSc: 0-No pain         Complications: No notable events documented.

## 2023-07-19 NOTE — Anesthesia Postprocedure Evaluation (Signed)
Anesthesia Post Note  Patient: [name]  Procedure(s) Performed: ESOPHAGOGASTRODUODENOSCOPY (EGD) WITH PROPOFOL BIOPSY POLYPECTOMY  Patient location during evaluation: PACU Anesthesia Type: General Level of consciousness: awake and alert Pain management: pain level controlled Vital Signs Assessment: post-procedure vital signs reviewed and stable Respiratory status: spontaneous breathing, nonlabored ventilation, respiratory function stable and patient connected to nasal cannula oxygen Cardiovascular status: blood pressure returned to baseline and stable Postop Assessment: no apparent nausea or vomiting Anesthetic complications: no   There were no known notable events for this encounter.   Last Vitals:  Vitals:   07/19/23 1015 07/19/23 1143  BP: 126/87 (!) 103/58  Pulse: 83 (!) 55  Resp: (!) 21 15  Temp: (!) 36.1 C 36.6 C  SpO2: 95% 97%    Last Pain:  Vitals:   07/19/23 1143  TempSrc: Oral  PainSc: 0-No pain                 Johnatha Zeidman L Aurelia Gras

## 2023-07-23 LAB — SURGICAL PATHOLOGY

## 2023-07-23 NOTE — Progress Notes (Signed)
I reviewed the pathology results. Ann, can you send her a letter with the findings as described below please?  Thanks,  [name], MD Gastroenterology and Hepatology [facility]  ---------------------------------------------------------------------------------------------  [facility] [address], [place], [place] [address] Phone:  [phone]   07/23/23 [name], [place]   Dear [name],  I am writing to inform you that the biopsies taken during your recent endoscopic examination showed:  Normal biopsy of the small bowel ( Duodenum) No H. Pylori bacteria in stomach , or any early cancer changes to the stomach mucosa ( Intestinal metaplasia)   Please call us at [phone] if you have persistent problems or have questions about your condition that have not been fully answered at this time.  Sincerely,  [name], MD Gastroenterology and Hepatology

## 2023-07-25 ENCOUNTER — Encounter (INDEPENDENT_AMBULATORY_CARE_PROVIDER_SITE_OTHER): Payer: Self-pay | Admitting: *Deleted

## 2023-07-27 ENCOUNTER — Encounter (HOSPITAL_COMMUNITY): Payer: Self-pay | Admitting: Gastroenterology

## 2023-08-03 MED ORDER — PEG 3350-KCL-NA BICARB-NACL 420 G PO SOLR: 4000.0000 mL | Freq: Once | ORAL | 0 refills | Status: AC

## 2023-08-03 NOTE — Progress Notes (Signed)
Hi Wendy ,  Can you please call the patient and tell the x-ray showed increased stool burden and is consistent with constipation.  I recommend bowel purge with 4 L GoLytely which I have ordered to clean your colon relieving constipation  Thanks,  [name], MD Gastroenterology and Hepatology [facility] Gastroenterology

## 2023-08-03 NOTE — Addendum Note (Signed)
Addended by: [name] on: 08/03/2023 08:35 AM   Modules accepted: Orders

## 2023-08-09 ENCOUNTER — Other Ambulatory Visit (INDEPENDENT_AMBULATORY_CARE_PROVIDER_SITE_OTHER): Payer: Self-pay | Admitting: *Deleted

## 2023-08-09 MED ORDER — PANTOPRAZOLE SODIUM 40 MG PO TBEC: 40.0000 mg | DELAYED_RELEASE_TABLET | Freq: Every day | ORAL | 1 refills | Status: DC

## 2023-08-20 ENCOUNTER — Other Ambulatory Visit (INDEPENDENT_AMBULATORY_CARE_PROVIDER_SITE_OTHER): Payer: Self-pay | Admitting: Gastroenterology

## 2023-09-21 ENCOUNTER — Other Ambulatory Visit (INDEPENDENT_AMBULATORY_CARE_PROVIDER_SITE_OTHER): Payer: Self-pay | Admitting: Gastroenterology

## 2023-09-21 ENCOUNTER — Other Ambulatory Visit: Payer: Self-pay | Admitting: Cardiology

## 2023-09-21 DIAGNOSIS — I10 Essential (primary) hypertension: Secondary | ICD-10-CM

## 2023-10-03 ENCOUNTER — Telehealth (INDEPENDENT_AMBULATORY_CARE_PROVIDER_SITE_OTHER): Payer: Self-pay | Admitting: *Deleted

## 2023-10-03 ENCOUNTER — Other Ambulatory Visit (INDEPENDENT_AMBULATORY_CARE_PROVIDER_SITE_OTHER): Payer: Self-pay | Admitting: *Deleted

## 2023-10-03 MED ORDER — PANTOPRAZOLE SODIUM 40 MG PO TBEC: 40.0000 mg | DELAYED_RELEASE_TABLET | Freq: Every day | ORAL | 1 refills | Status: DC

## 2023-10-03 NOTE — Telephone Encounter (Signed)
Med changed to #100 and patient notified.

## 2023-10-03 NOTE — Telephone Encounter (Signed)
Sure it can be changed

## 2023-10-03 NOTE — Telephone Encounter (Signed)
Pharmacy optum rx requesting 100 day supply instead of 90  of pantoprazole 45m. She takes one daily.   [phone]

## 2023-10-04 ENCOUNTER — Encounter (INDEPENDENT_AMBULATORY_CARE_PROVIDER_SITE_OTHER): Payer: Self-pay | Admitting: Gastroenterology

## 2023-10-04 LAB — ANA: Anti Nuclear Antibody (ANA): NEGATIVE

## 2023-10-10 LAB — IRON,TIBC AND FERRITIN PANEL
%SAT: 37 % (ref 16–45)
Ferritin: 252 ng/mL (ref 16–288)
Iron: 115 ug/dL (ref 45–160)
TIBC: 312 ug/dL (ref 250–450)

## 2023-10-10 LAB — PROTIME-INR
INR: 0.9
Prothrombin Time: 10.3 s (ref 9.0–11.5)

## 2023-10-10 LAB — HEPATITIS B CORE ANTIBODY, TOTAL: Hep B Core Total Ab: NONREACTIVE

## 2023-10-10 LAB — COMPREHENSIVE METABOLIC PANEL
AG Ratio: 1.8 (calc) (ref 1.0–2.5)
ALT: 35 U/L — ABNORMAL HIGH (ref 6–29)
AST: 18 U/L (ref 10–35)
Albumin: 4.4 g/dL (ref 3.6–5.1)
Alkaline phosphatase (APISO): 96 U/L (ref 37–153)
BUN: 12 mg/dL (ref 7–25)
CO2: 19 mmol/L — ABNORMAL LOW (ref 20–32)
Calcium: 9.5 mg/dL (ref 8.6–10.4)
Chloride: 110 mmol/L (ref 98–110)
Creat: 0.6 mg/dL (ref 0.50–1.05)
Globulin: 2.5 g/dL (ref 1.9–3.7)
Glucose, Bld: 95 mg/dL (ref 65–99)
Potassium: 3.8 mmol/L (ref 3.5–5.3)
Sodium: 141 mmol/L (ref 135–146)
Total Bilirubin: 0.6 mg/dL (ref 0.2–1.2)
Total Protein: 6.9 g/dL (ref 6.1–8.1)

## 2023-10-10 LAB — HEPATITIS C ANTIBODY: Hepatitis C Ab: NONREACTIVE

## 2023-10-10 LAB — HEPATITIS B SURFACE ANTIGEN: Hepatitis B Surface Ag: NONREACTIVE

## 2023-10-10 LAB — HEPATITIS B SURFACE ANTIBODY,QUALITATIVE: Hep B S Ab: NONREACTIVE

## 2023-10-10 LAB — HIV ANTIBODY (ROUTINE TESTING W REFLEX): HIV 1&2 Ab, 4th Generation: NONREACTIVE

## 2023-10-10 LAB — ANTI-SMOOTH MUSCLE ANTIBODY, IGG: Actin (Smooth Muscle) Antibody (IGG): <20 U (ref <20)

## 2023-10-10 LAB — HEPATITIS A ANTIBODY, TOTAL: Hepatitis A AB,Total: REACTIVE — AB

## 2023-10-10 LAB — MITOCHONDRIAL ANTIBODIES: Mitochondrial M2 Ab, IgG: 65.2 U — ABNORMAL HIGH (ref <=20.0)

## 2023-10-19 ENCOUNTER — Encounter (INDEPENDENT_AMBULATORY_CARE_PROVIDER_SITE_OTHER): Payer: Self-pay | Admitting: *Deleted

## 2023-10-23 ENCOUNTER — Encounter (INDEPENDENT_AMBULATORY_CARE_PROVIDER_SITE_OTHER): Payer: Self-pay | Admitting: Gastroenterology

## 2023-10-23 ENCOUNTER — Ambulatory Visit (INDEPENDENT_AMBULATORY_CARE_PROVIDER_SITE_OTHER): Payer: Medicare Other | Admitting: Gastroenterology

## 2023-10-23 VITALS — BP 107/74 | HR 67 | Temp 97.2°F | Ht 65.0 in | Wt 193.6 lb

## 2023-10-23 DIAGNOSIS — R748 Abnormal levels of other serum enzymes: Secondary | ICD-10-CM

## 2023-10-23 DIAGNOSIS — K760 Fatty (change of) liver, not elsewhere classified: Secondary | ICD-10-CM

## 2023-10-23 DIAGNOSIS — K5904 Chronic idiopathic constipation: Secondary | ICD-10-CM | POA: Diagnosis not present

## 2023-10-23 DIAGNOSIS — R7401 Elevation of levels of liver transaminase levels: Secondary | ICD-10-CM | POA: Diagnosis not present

## 2023-10-23 DIAGNOSIS — R768 Other specified abnormal immunological findings in serum: Secondary | ICD-10-CM | POA: Diagnosis not present

## 2023-10-23 MED ORDER — LINACLOTIDE 145 MCG PO CAPS
145.0000 ug | ORAL_CAPSULE | Freq: Every day | ORAL | 1 refills | Status: DC
Start: 2023-10-23 — End: 2024-03-24

## 2023-10-23 NOTE — Progress Notes (Signed)
 Bernabe Dorce Faizan Gonzalo Waymire , M.D. Gastroenterology & Hepatology [facility] [address] [place], [place] [address] Primary Care Physician: Debrah Josette ORN., PA-C [address] [place] [address]  Chief Complaint: Altered bowel movements, abdominal pain and elevated liver enzymes  History of Present Illness: [name] is a 63 y.o. female with epilepsy, COPD, CVA, rheumatoid arthritis on adalimumab who presents for evaluation of Altered bowel movements, abdominal pain and elevated liver enzymes.  Patient reports that most of the day she was having a liquid bowel movement followed by hard stools every 1 to 2 days, with sense of incomplete evacuation.  Reports her stools are yellow in color liquid but denies any blood in stool.  Patient has postprandial abdominal pain which is new since past 6 months.  Although her complaints are not much related to abdominal pain rather diarrhea. Patient reports that she has not started adalimumab or any new medications as of yet.  Last took leflunomide 6 months ago, taking intermittently prednisone.  Patient denies any herbal use or any new medications at  Also on further questioning today patient has significant fatigue and generalized pruritus  Last ZHI:wnwz Last Colonoscopy: 2021 normal colonoscopy suggest repeat 10 years  FHx: neg for any gastrointestinal/liver disease, no malignancies Social: neg smoking, alcohol or illicit drug use Surgical: Appendectomy, cholecystectomy, hernia repair, hysterectomy  Past Medical History: Past Medical History:  Diagnosis Date   Allergic rhinitis    uses Flonase  daily as needed and takes CLaritin  daily   Anxiety    takes Xanax  daily as needed   Asthma    Albuterol  inhaler prn;SYmbicort  daily   Cerebral vascular malformation    sees dr mavis for monitoring as needed, sees dr lewitt for headaches every 4 months   COPD (chronic obstructive pulmonary disease)  (HCC)    Depression    takes Citalopram  daily   Diverticulitis at age 19   Eczema    Fibromyalgia    GERD (gastroesophageal reflux disease)    takes Protonix  daily   Hard of hearing    History of kidney stones    History of migraine    last one 10+yrs ago   History of staph infection 63yrs ago   Hyperlipidemia    takes Pravastatin  daily   IBS (irritable bowel syndrome)    mixed   Insomnia    Joint pain    Lactose intolerance    Nausea    takes Zofran  daily as needed   PFO (patent foramen ovale)    Plaque psoriasis    PONV (postoperative nausea and vomiting)    Postoperative anemia due to acute blood loss 02/08/2016   Primary localized osteoarthritis of left knee    Primary localized osteoarthritis of right knee 11/03/2015   Rheumatoid arthritis(714.0) 10/16/2008   oa and ra;Rhemicade IV every 6wks and Metotrexate weekly   Seizures (HCC) 6months ago 03/21/14   takes Depakote  daily   Shortness of breath    with exertion   Sleep apnea    study done >54yrs ago;uses CPAP nightly   Spinal headache    patient states that she thinks she had a spinal headache a long time ago   Stress incontinence    Stroke (HCC) 03/26/2013   left sided weakness   Thyroid  cyst     Past Surgical History: Past Surgical History:  Procedure Laterality Date   APPENDECTOMY  1981   BIOPSY  12/16/2019   Procedure: BIOPSY;  Surgeon: Kristie Lamprey, MD;  Location: [facility] ENDOSCOPY;  Service: Endoscopy;;   BIOPSY  07/19/2023   Procedure: BIOPSY;  Surgeon: Cinderella Deatrice FALCON, MD;  [facility]: [facility];  Service: Endoscopy;;   CARDIAC CATHETERIZATION  2004   CHOLECYSTECTOMY  1981   COLONOSCOPY     COLONOSCOPY WITH PROPOFOL  N/A 12/16/2019   Procedure: COLONOSCOPY WITH PROPOFOL ;  Surgeon: Kristie Lamprey, MD;  Location: [facility];  Service: Endoscopy;  Laterality: N/A;   CORONARY PRESSURE/FFR STUDY N/A 07/25/2022   Procedure: INTRAVASCULAR PRESSURE WIRE/FFR STUDY;  Surgeon: Elmira Newman PARAS, MD;  [facility]: [facility]  INVASIVE CV LAB;  Service: Cardiovascular;  Laterality: N/A;   ESOPHAGOGASTRODUODENOSCOPY     ESOPHAGOGASTRODUODENOSCOPY (EGD) WITH PROPOFOL  N/A 07/19/2023   Procedure: ESOPHAGOGASTRODUODENOSCOPY (EGD) WITH PROPOFOL ;  Surgeon: Cinderella Deatrice FALCON, MD;  [facility]: [facility];  Service: Endoscopy;  Laterality: N/A;  12:45;asa 1-2, bumped to 1:00 for lunch to fit - messaged Tanya   FOOT SURGERY  1999   right ankle   HERNIA REPAIR  2006   x2   INCISIONAL HERNIA REPAIR  05/20/2012   Procedure: HERNIA REPAIR INCISIONAL;  Surgeon: Debby LABOR. Cornett, MD;  Location: [facility] ORS;  Service: General;  Laterality: N/A;   INCONTINENCE SURGERY  2010   sling done    KIDNEY STONE SURGERY  2001   KNEE ARTHROSCOPY  1992   left   left foot plating and scarping for arthritis  2011   LEFT HEART CATH AND CORONARY ANGIOGRAPHY N/A 07/25/2022   Procedure: LEFT HEART CATH AND CORONARY ANGIOGRAPHY;  Surgeon: Elmira Newman PARAS, MD;  [facility]: [facility] INVASIVE CV LAB;  Service: Cardiovascular;  Laterality: N/A;   POLYPECTOMY  07/19/2023   Procedure: POLYPECTOMY;  Surgeon: Cinderella Deatrice FALCON, MD;  [facility];  Service: Endoscopy;;   right ear tube insertion  2011   TEE WITHOUT CARDIOVERSION N/A 05/15/2013   Procedure: TRANSESOPHAGEAL ECHOCARDIOGRAM (TEE);  Surgeon: Erick JONELLE Bergamo, MD;  Location[facility] ENDOSCOPY;  Service: Cardiovascular;  Laterality: N/A;   thryoid biopsy     TONSILLECTOMY AND ADENOIDECTOMY  04/01/2014   TONSILLECTOMY AND ADENOIDECTOMY Bilateral 04/01/2014   Procedure: BILATERAL TONSILLECTOMY AND ADENOIDECTOMY;  Surgeon: Ana LELON Moccasin, MD;  Location[facility] OR;  Service: ENT;  Laterality: Bilateral;   TOTAL ABDOMINAL HYSTERECTOMY  2001   TOTAL KNEE ARTHROPLASTY Right 11/03/2015   TOTAL KNEE ARTHROPLASTY Right 11/03/2015   Procedure: RIGHT TOTAL KNEE ARTHROPLASTY/RIGHT;  Surgeon: Lamar Millman, MD;  Location: [facility] OR;  Service: Orthopedics;  Laterality: Right;   TOTAL KNEE ARTHROPLASTY Left 02/07/2016    Procedure: TOTAL KNEE ARTHROPLASTY;  Surgeon: Lamar Millman, MD;  Location: [facility] OR;  Service: Orthopedics;  Laterality: Left;   TUBAL LIGATION  1990    Family History: Family History  Problem Relation Age of Onset   Ovarian cancer Mother    Diabetes Maternal Grandmother    Arthritis Maternal Grandmother    Diabetes Brother    Diabetes Paternal Grandmother    Diabetes Maternal Grandfather    Diabetes Paternal Grandfather    Heart disease Brother    Heart disease Other        Uncle   Breast cancer Maternal Aunt     Social History: Social History   Tobacco Use  Smoking Status Former   Current packs/day: 0.00   Average packs/day: 2.0 packs/day for 30.0 years (60.0 ttl pk-yrs)   Types: Cigarettes   Start date: 10/19/1969   Quit date: 10/20/1999   Years since quitting: 24.0  Smokeless Tobacco Never   Social History  Substance and Sexual Activity  Alcohol Use Yes   Comment: occ   Social History   Substance and Sexual Activity  Drug Use No    Allergies: Allergies  Allergen Reactions   Aquacel [Carboxymethylcellulose] Other (See Comments)    Blisters    Dextromethorphan Anaphylaxis   Nickel Rash    Turn read   Sulfonamide Derivatives Anaphylaxis, Hives and Swelling    REACTION: swelling, tongue and hives   Aspirin  Nausea And Vomiting    REACTION: severe gi upset.  Pt states she can tolerate 81 mg asa only.    Ciprofloxacin Itching and Swelling    REACTION: angioedema, urticaria   Cosyntropin Hives and Swelling    REACTION: swelling, hives   Adhesive [Tape] Other (See Comments)    Blisters skin   Amoxicillin  Nausea And Vomiting    REACTION: GI upset   Latex Rash    Blisters   Moxifloxacin Other (See Comments)    gi upset   Nsaids Other (See Comments)    Gi upset    Medications: Current Outpatient Medications  Medication Sig Dispense Refill   albuterol  (VENTOLIN  HFA) 108 (90 Base) MCG/ACT inhaler Inhale 1-2 puffs into the lungs every 6 (six) hours as  needed for wheezing or shortness of breath.     ALPRAZolam  (XANAX ) 0.5 MG tablet Take 0.5 mg by mouth at bedtime.     amLODipine  (NORVASC ) 5 MG tablet TAKE 1 TABLET BY MOUTH DAILY 30 tablet 0   aspirin  EC 81 MG tablet Take 81 mg by mouth at bedtime.     azelastine (OPTIVAR) 0.05 % ophthalmic solution 1 drop 2 (two) times daily.     Calcium -Phosphorus-Vitamin D (CITRACAL +D3 PO) Take 1 tablet by mouth 2 (two) times daily.     ipratropium (ATROVENT ) 0.03 % nasal spray Place 2 sprays into the nose in the morning and at bedtime.     linaclotide  (LINZESS ) 145 MCG CAPS capsule Take 1 capsule (145 mcg total) by mouth daily. 90 capsule 1   nitroGLYCERIN  (NITROSTAT ) 0.4 MG SL tablet Place 1 tablet (0.4 mg total) under the tongue every 5 (five) minutes as needed for chest pain. 25 tablet 3   OVER THE COUNTER MEDICATION Bio True eye drops     pantoprazole  (PROTONIX ) 40 MG tablet Take 1 tablet (40 mg total) by mouth daily. 100 tablet 1   rosuvastatin  (CRESTOR ) 40 MG tablet Take 1 tablet (40 mg total) by mouth daily. 90 tablet 3   topiramate  (TOPAMAX ) 100 MG tablet Take 100 mg by mouth 2 (two) times daily.     triamcinolone cream (KENALOG) 0.1 % Apply 1 application topically 2 (two) times daily as needed (skin irritation.).      No current facility-administered medications for this visit.    Review of Systems: GENERAL: negative for malaise, night sweats HEENT: No changes in hearing or vision, no nose bleeds or other nasal problems. NECK: Negative for lumps, goiter, pain and significant neck swelling RESPIRATORY: Negative for cough, wheezing CARDIOVASCULAR: Negative for chest pain, leg swelling, palpitations, orthopnea GI: SEE HPI MUSCULOSKELETAL: Negative for joint pain or swelling, back pain, and muscle pain. SKIN: Negative for lesions, rash HEMATOLOGY Negative for prolonged bleeding, bruising easily, and swollen nodes. ENDOCRINE: Negative for cold or heat intolerance, polyuria, polydipsia and  goiter. NEURO: negative for tremor, gait imbalance, syncope and seizures. The remainder of the review of systems is noncontributory.   Physical Exam: BP 107/74   Pulse 67   Temp (!) 97.2 F (36.2 C) (  Skin)   Ht 5' 5 (1.651 m)   Wt 193 lb 9.6 oz (87.8 kg)   BMI 32.22 kg/m  GENERAL: The patient is AO x3, in no acute distress. HEENT: Head is normocephalic and atraumatic. EOMI are intact. Mouth is well hydrated and without lesions. NECK: Supple. No masses LUNGS: Clear to auscultation. No presence of rhonchi/wheezing/rales. Adequate chest expansion HEART: RRR, normal s1 and s2. ABDOMEN: Soft, nontender, no guarding, no peritoneal signs, and nondistended. BS +. No masses. EXTREMITIES: Without any cyanosis, clubbing, rash, lesions or edema. NEUROLOGIC: AOx3, no focal motor deficit. SKIN: no jaundice, no rashes   Imaging/Labs: as above     Latest Ref Rng & Units 07/24/2022   11:02 AM 04/26/2018    9:59 AM 02/10/2016    5:22 AM  CBC  WBC 3.4 - 10.8 x10E3/uL 5.4  7.9  9.1   Hemoglobin 11.1 - 15.9 g/dL 85.3  86.6  89.2   Hematocrit 34.0 - 46.6 % 44.2  39.9  32.5   Platelets 150 - 450 x10E3/uL 176  198  110    Lab Results  Component Value Date   IRON 115 10/03/2023   TIBC 312 10/03/2023   FERRITIN 252 10/03/2023    I personally reviewed and interpreted the available labs, imaging and endoscopic files.    Liver enzymes from 06/21/2023 ALT 69 AST 41 alk phos 92 T. bili 0.6 07/04/2023  IMPRESSION:  1. Status post cholecystectomy.  2. Increased hepatic parenchymal echogenicity suggestive of  steatosis.   Negative celiac panel 2022  Negative ASMA, HIV, ANA Negative hep C, negative hep B surface antigen, hep B nonimmune nonexposed Hep A immune Ferritin 252 INR 0.9 ALT 35 alk phos 96 AMA significantly 65.2   Impression and Plan:  DENIECE RANKIN is a 63 y.o. female with epilepsy, COPD, CVA, rheumatoid arthritis on adalimumab who presents for evaluation of Altered bowel  movements, abdominal pain and elevated liver enzymes.  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  She has not started adalimumab but was previously on leflunomide which was stopped 6 months ago  Leflunomide likelihood score: B (well known cause of idiosyncratic clinically apparent liver injury as well as reactivation of hepatitis B). Adalimumab Likelihood score: B (highly likely cause of clinically apparent liver injury). Rosuvastatin  Likelihood score: A (likely cause of clinically apparent liver injury). Topiramate  Likelihood score: C (probable rare cause of clinically apparent liver injury).  At this time I would not stop the statin or hold on starting new medication by rheumatology including Humira, with close monitoring of her liver enzymes and if significant increase than would change medications. Patient currently is NOT on any maintenance therapy and hence this is unlikely DILI   Further questioning patient does have significant fatigue and pruritus which can be seen in patient with PBC.  Although interestingly her ALP  has always been consistently normal  Recs:  We discussed with patient utility of liver biopsy given normal alk phos and significant positive AMA in setting of elevated ALT to rule out PBC-AIH overlap.  Patient agreed to proceed with liver biopsy   Will send  anti GP 210 and SP 100, GGT and IGM  Studies have shown evidence positive patient with normal alk phos found to have evidence of PBC well before ALP elevation   Trellis LELON Laurence GORMAN Lucillie S, Jennetta ONEIDA Lucillie KATHEE Cesario OLEGARIO Cesario CINDERELLA Maryelizabeth OLEGARIO, Ma H, Ou X, You H, Jia J. The future risk of primary biliary cholangitis (PBC) is low among patients with incidental anti-mitochondrial antibodies but without baseline PBC. Hepatol Commun. 2022 Nov;6(11):3112-3119. doi: 10.1002/hep4.2067. Epub 2022 Aug 23. PMID: 64001725; PMCID: [id number].)  Also importantly patient has rheumatoid arthritis and is not on any maintenance therapy.  Patient will follow-up with rheumatology.  Patient may be started on maintenance therapy with close monitoring of liver enzymes as slight elevation liver enzymes are likely due to other underlying autoimmune cholangiopathy or MASLD   #MASLD  Risk factors for MASLD with  BMI 31 and prediabetes hemoglobin A1c 5.8 and mixed hyperlipidemia   Recommendation :     - walking at a brisk pace/biking at moderate intesity 2.5-5 hours per week     - use pedometer/step counter to track activity     - goal to lose 5-10% of initial body weight     - avoid suagry drinks and juices, use zero calorie beverages     - increase water  intake     - eat a low carb diet with plenty of veggies and fruit     - Get sufficient sleep 7-8 hrs nightly     - maitain active lifestyle     - avoid alcohol     - recommend 2-3 cups Coffee daily     - Counsel on lowering cholesterol by having a diet rich in vegetables,          protein (avoid red meats) and good fats(fish, salmon).  #Alternating Bowel movements # Chronic idiopathic constipation  Recent x-ray with large amount of stool burden.  Patient felt well after bowel purge but is unable to tolerate MiraLAX  and Metamucil on a consistent basis and continues to be because  Will start Linzess  145 mcg to improve global symptoms as well as constipation as patient has not responded much to Metamucil and MiraLAX  challenge Ensure adequate fluid intake: Aim for 8 glasses of water  daily. Follow a high fiber diet: Include foods such as dates, prunes, pears, and kiwi. Continue  Miralax  twice a  day for the first week, then reduce to once daily thereafter.  HCM: Last colonoscopy 2021, suggest repeat 10 years  All questions were answered.      Yurem Viner Faizan Amandine Covino, MD Gastroenterology and Hepatology [facility]   This chart has been completed using [facility] Dictation software, and while attempts have been made to ensure accuracy , certain words and phrases may not be transcribed as intended

## 2023-10-23 NOTE — Addendum Note (Signed)
 Addended by: Marlowe Shores on: 10/23/2023 10:42 AM   Modules accepted: Orders

## 2023-10-23 NOTE — Patient Instructions (Signed)
 It was very nice to meet you today, as dicussed with will plan for the following :  1) Liver biopsy  2) lab work  3)Linzess  works best when taken once a day every day, on an empty stomach, at least 30 minutes before your first meal of the day.  When Linzess  is taken daily as directed:  *Constipation relief is typically felt in about a week *IBS-C patients may begin to experience relief from belly pain and overall abdominal symptoms (pain, discomfort, and bloating) in about 1 week,   with symptoms typically improving over 12 weeks.  Diarrhea, nausea or abdominal cramping may occur in the first 2 weeks -keep taking it.  These symptoms should eventually resolve. Please notify us  if having more than 4 watery bowel movements per day or fecal soiling accidents.   4) Ensure adequate fluid intake: Aim for 8 glasses of water  daily. Follow a high fiber diet: Include foods such as dates, prunes, pears, and kiwi. Take Miralax  twice a day for the first week, then reduce to once daily thereafter. Use Metamucil twice a day.

## 2023-10-24 ENCOUNTER — Encounter (INDEPENDENT_AMBULATORY_CARE_PROVIDER_SITE_OTHER): Payer: Self-pay | Admitting: Gastroenterology

## 2023-10-28 ENCOUNTER — Encounter (INDEPENDENT_AMBULATORY_CARE_PROVIDER_SITE_OTHER): Payer: Self-pay | Admitting: Gastroenterology

## 2023-10-28 NOTE — Progress Notes (Signed)
 ALP elevated for the first time : 122 ALT: 49 remains elevated  GGT : 28  IgM: 128 ( normal)  Pending Anti GP 210 and liver biopsy

## 2023-10-29 ENCOUNTER — Other Ambulatory Visit (INDEPENDENT_AMBULATORY_CARE_PROVIDER_SITE_OTHER): Payer: Self-pay | Admitting: Gastroenterology

## 2023-10-29 DIAGNOSIS — L299 Pruritus, unspecified: Secondary | ICD-10-CM

## 2023-10-29 MED ORDER — HYDROXYZINE HCL 10 MG PO TABS: 10.0000 mg | ORAL_TABLET | Freq: Three times a day (TID) | ORAL | 0 refills | Status: AC | PRN

## 2023-10-29 NOTE — Telephone Encounter (Signed)
 I will send a prescription for hydroxyzine  to take as needed for now to relieve her itching episodes. Dr. Cinderella will discuss next steps once he is back.  FYI Dr. Cinderella, I think her labs are suggestive of PBC.  May consider management with [facility]  but will defer to you.

## 2023-11-01 LAB — COMPREHENSIVE METABOLIC PANEL
ALT: 49 [IU]/L — ABNORMAL HIGH (ref 0–32)
AST: 21 [IU]/L (ref 0–40)
Albumin: 4.2 g/dL (ref 3.9–4.9)
Alkaline Phosphatase: 122 [IU]/L — ABNORMAL HIGH (ref 44–121)
BUN/Creatinine Ratio: 15 (ref 12–28)
BUN: 10 mg/dL (ref 8–27)
Bilirubin Total: 0.3 mg/dL (ref 0.0–1.2)
CO2: 19 mmol/L — ABNORMAL LOW (ref 20–29)
Calcium: 9.2 mg/dL (ref 8.7–10.3)
Chloride: 110 mmol/L — ABNORMAL HIGH (ref 96–106)
Creatinine, Ser: 0.66 mg/dL (ref 0.57–1.00)
Globulin, Total: 2.4 g/dL (ref 1.5–4.5)
Glucose: 101 mg/dL — ABNORMAL HIGH (ref 70–99)
Potassium: 4.1 mmol/L (ref 3.5–5.2)
Sodium: 144 mmol/L (ref 134–144)
Total Protein: 6.6 g/dL (ref 6.0–8.5)
eGFR: 98 mL/min/{1.73_m2} (ref >59)

## 2023-11-01 LAB — ANTI-GP-210 AB (RDL): Anti-GP-210 Ab (RDL): <20 U (ref <20)

## 2023-11-01 LAB — IGM: IgM (Immunoglobulin M), Srm: 128 mg/dL (ref 26–217)

## 2023-11-01 LAB — GAMMA GT: GGT: 28 [IU]/L (ref 0–60)

## 2023-11-05 ENCOUNTER — Other Ambulatory Visit: Payer: Self-pay | Admitting: Radiology

## 2023-11-05 DIAGNOSIS — R748 Abnormal levels of other serum enzymes: Secondary | ICD-10-CM

## 2023-11-06 ENCOUNTER — Other Ambulatory Visit: Payer: Self-pay

## 2023-11-06 ENCOUNTER — Ambulatory Visit (HOSPITAL_COMMUNITY)
Admission: RE | Admit: 2023-11-06 | Discharge: 2023-11-06 | Disposition: A | Discharge status: Home / Self Care | Payer: Medicare Other | Source: Ambulatory Visit | Attending: Gastroenterology

## 2023-11-06 ENCOUNTER — Encounter (HOSPITAL_COMMUNITY): Payer: Self-pay

## 2023-11-06 VITALS — BP 112/75 | HR 70 | Temp 97.9°F | Resp 16 | Ht 65.0 in | Wt 188.0 lb

## 2023-11-06 DIAGNOSIS — Z01818 Encounter for other preprocedural examination: Secondary | ICD-10-CM

## 2023-11-06 DIAGNOSIS — R7989 Other specified abnormal findings of blood chemistry: Secondary | ICD-10-CM | POA: Diagnosis present

## 2023-11-06 DIAGNOSIS — R748 Abnormal levels of other serum enzymes: Secondary | ICD-10-CM

## 2023-11-06 DIAGNOSIS — K7581 Nonalcoholic steatohepatitis (NASH): Secondary | ICD-10-CM | POA: Insufficient documentation

## 2023-11-06 LAB — COMPREHENSIVE METABOLIC PANEL WITH GFR
ALT: 42 U/L (ref 0–44)
AST: 41 U/L (ref 15–41)
Albumin: 4.1 g/dL (ref 3.5–5.0)
Alkaline Phosphatase: 78 U/L (ref 38–126)
Anion gap: 10 (ref 5–15)
BUN: 10 mg/dL (ref 8–23)
CO2: 15 mmol/L — ABNORMAL LOW (ref 22–32)
Calcium: 9 mg/dL (ref 8.9–10.3)
Chloride: 111 mmol/L (ref 98–111)
Creatinine, Ser: 0.6 mg/dL (ref 0.44–1.00)
GFR, Estimated: >60 mL/min
Glucose, Bld: 111 mg/dL — ABNORMAL HIGH (ref 70–99)
Potassium: 4.6 mmol/L (ref 3.5–5.1)
Sodium: 136 mmol/L (ref 135–145)
Total Bilirubin: 1.8 mg/dL — ABNORMAL HIGH (ref 0.0–1.2)
Total Protein: 7.2 g/dL (ref 6.5–8.1)

## 2023-11-06 LAB — CBC WITH DIFFERENTIAL/PLATELET
Abs Immature Granulocytes: 0.02 K/uL (ref 0.00–0.07)
Basophils Absolute: 0 K/uL (ref 0.0–0.1)
Basophils Relative: 1 %
Eosinophils Absolute: 0.1 K/uL (ref 0.0–0.5)
Eosinophils Relative: 1 %
HCT: 44.9 % (ref 36.0–46.0)
Hemoglobin: 15.2 g/dL — ABNORMAL HIGH (ref 12.0–15.0)
Immature Granulocytes: 0 %
Lymphocytes Relative: 27 %
Lymphs Abs: 1.9 K/uL (ref 0.7–4.0)
MCH: 32.5 pg (ref 26.0–34.0)
MCHC: 33.9 g/dL (ref 30.0–36.0)
MCV: 95.9 fL (ref 80.0–100.0)
Monocytes Absolute: 0.6 K/uL (ref 0.1–1.0)
Monocytes Relative: 8 %
Neutro Abs: 4.4 K/uL (ref 1.7–7.7)
Neutrophils Relative %: 63 %
Platelets: 205 K/uL (ref 150–400)
RBC: 4.68 MIL/uL (ref 3.87–5.11)
RDW: 12.5 % (ref 11.5–15.5)
WBC: 7 K/uL (ref 4.0–10.5)
nRBC: 0 % (ref 0.0–0.2)

## 2023-11-06 LAB — PROTIME-INR
INR: 1 (ref 0.8–1.2)
Prothrombin Time: 13.6 s (ref 11.4–15.2)

## 2023-11-06 MED ORDER — FENTANYL CITRATE (PF) 100 MCG/2ML IJ SOLN
INTRAMUSCULAR | Status: AC | PRN
  Administered 2023-11-06: 50 ug via INTRAVENOUS

## 2023-11-06 MED ORDER — MIDAZOLAM HCL 2 MG/2ML IJ SOLN
INTRAMUSCULAR | Status: AC | PRN
  Administered 2023-11-06: 1 mg via INTRAVENOUS

## 2023-11-06 MED ORDER — FENTANYL CITRATE (PF) 100 MCG/2ML IJ SOLN
INTRAMUSCULAR | Status: AC
  Filled 2023-11-06: qty 2

## 2023-11-06 MED ORDER — ONDANSETRON HCL 4 MG/2ML IJ SOLN
INTRAMUSCULAR | Status: AC
  Filled 2023-11-06: qty 2

## 2023-11-06 MED ORDER — OXYCODONE HCL 5 MG PO TABS
10.0000 mg | ORAL_TABLET | Freq: Four times a day (QID) | ORAL | Status: DC | PRN
  Administered 2023-11-06: 10 mg via ORAL
  Filled 2023-11-06: qty 2

## 2023-11-06 MED ORDER — MIDAZOLAM HCL 2 MG/2ML IJ SOLN
INTRAMUSCULAR | Status: AC
  Filled 2023-11-06: qty 2

## 2023-11-06 MED ORDER — LIDOCAINE HCL 1 % IJ SOLN
INTRAMUSCULAR | Status: AC | PRN
  Administered 2023-11-06: 10 mL via INTRADERMAL

## 2023-11-06 MED ORDER — ONDANSETRON HCL 4 MG/2ML IJ SOLN: 4.0000 mg | Freq: Once | INTRAMUSCULAR | Status: DC

## 2023-11-06 MED ORDER — LIDOCAINE HCL 1 % IJ SOLN
INTRAMUSCULAR | Status: AC
  Filled 2023-11-06: qty 20

## 2023-11-06 MED ORDER — SODIUM CHLORIDE 0.9 % IV SOLN: INTRAVENOUS | Status: DC

## 2023-11-06 MED ORDER — ONDANSETRON HCL 4 MG/2ML IJ SOLN
INTRAMUSCULAR | Status: AC | PRN
  Administered 2023-11-06: 4 mg via INTRAVENOUS

## 2023-11-06 NOTE — H&P (Signed)
Chief Complaint: Patient was seen in consultation today for elevated LFT at the request of Ahmed,Muhammad F  Referring Physician(s): Ahmed,Muhammad F  Supervising Physician: Roanna Banning  Patient Status: [facility] - Out-pt  History of Present Illness: [name] is a 63 y.o. female with PMHs of PFO, COPD, IBS, CVA, rheumatoid arthritis and elevated LFT who presents for random liver bx.   Patient was seen by GI recently for altered bowel movements, abdominal pain and elevated liver enzymes. Serologic work up showed elevated mitochondrial antibodies, elevated ALT 35, hep A antibody reactive. No US/CT of the abdomen in EMR. Random liver bx for further eval was recommended to the patient which she decided to proceed.   Patient laying in bed, not in acute distress.  Reports mild nausea due to being NPO, also states that she gets nauseous with sedation. 4 mg IV Zofran ordered.  Denise headache, fever, chills, shortness of breath, cough, chest pain, abdominal pain, vomiting, and bleeding.   Past Medical History:  Diagnosis Date   Allergic rhinitis    uses Flonase daily as needed and takes CLaritin daily   Anxiety    takes Xanax daily as needed   Asthma    Albuterol inhaler prn;SYmbicort daily   Cerebral vascular malformation    sees dr Lovell Sheehan for monitoring as needed, sees dr lewitt for headaches every 4 months   COPD (chronic obstructive pulmonary disease) (HCC)    Depression    takes Citalopram daily   Diverticulitis at age 79   Eczema    Fibromyalgia    GERD (gastroesophageal reflux disease)    takes Protonix daily   Hard of hearing    History of kidney stones    History of migraine    last one 10+yrs ago   History of staph infection 53yrs ago   Hyperlipidemia    takes Pravastatin daily   IBS (irritable bowel syndrome)    mixed   Insomnia    Joint pain    Lactose intolerance    Nausea    takes Zofran daily as needed   PFO (patent foramen ovale)    Plaque  psoriasis    PONV (postoperative nausea and vomiting)    Postoperative anemia due to acute blood loss 02/08/2016   Primary localized osteoarthritis of left knee    Primary localized osteoarthritis of right knee 11/03/2015   Rheumatoid arthritis(714.0) 10/16/2008   oa and ra;Rhemicade IV every 6wks and Metotrexate weekly   Seizures (HCC) 6months ago 03/21/14   takes Depakote daily   Shortness of breath    with exertion   Sleep apnea    study done >81yrs ago;uses CPAP nightly   Spinal headache    patient states that she thinks she had a spinal headache a long time ago   Stress incontinence    Stroke (HCC) 03/26/2013   left sided weakness   Thyroid cyst     Past Surgical History:  Procedure Laterality Date   APPENDECTOMY  1981   BIOPSY  12/16/2019   Procedure: BIOPSY;  Surgeon: Charna Elizabeth, MD;  Location: [facility];  Service: Endoscopy;;   BIOPSY  07/19/2023   Procedure: BIOPSY;  Surgeon: Franky Macho, MD;  [facility];  Service: Endoscopy;;   CARDIAC CATHETERIZATION  2004   CHOLECYSTECTOMY  1981   COLONOSCOPY     COLONOSCOPY WITH PROPOFOL N/A 12/16/2019   Procedure: COLONOSCOPY WITH PROPOFOL;  Surgeon: Charna Elizabeth, MD;  Location: [facility];  Service: Endoscopy;  Laterality: N/A;  CORONARY PRESSURE/FFR STUDY N/A 07/25/2022   Procedure: INTRAVASCULAR PRESSURE WIRE/FFR STUDY;  Surgeon: Elder Negus, MD;  [facility]: [facility];  Service: Cardiovascular;  Laterality: N/A;   ESOPHAGOGASTRODUODENOSCOPY     ESOPHAGOGASTRODUODENOSCOPY (EGD) WITH PROPOFOL N/A 07/19/2023   Procedure: ESOPHAGOGASTRODUODENOSCOPY (EGD) WITH PROPOFOL;  Surgeon: Franky Macho, MD;  [facility];  Service: Endoscopy;  Laterality: N/A;  12:45;asa 1-2, bumped to 1:00 for lunch to fit - messaged Tanya   FOOT SURGERY  1999   right ankle   HERNIA REPAIR  2006   x2   INCISIONAL HERNIA REPAIR  05/20/2012   Procedure: HERNIA REPAIR INCISIONAL;  Surgeon: Clovis Pu. Cornett,  MD;  Location: [facility];  Service: General;  Laterality: N/A;   INCONTINENCE SURGERY  2010   sling done    KIDNEY STONE SURGERY  2001   KNEE ARTHROSCOPY  1992   left   left foot plating and scarping for arthritis  2011   LEFT HEART CATH AND CORONARY ANGIOGRAPHY N/A 07/25/2022   Procedure: LEFT HEART CATH AND CORONARY ANGIOGRAPHY;  Surgeon: Elder Negus, MD;  [facility];  Service: Cardiovascular;  Laterality: N/A;   POLYPECTOMY  07/19/2023   Procedure: POLYPECTOMY;  Surgeon: Franky Macho, MD;  [facility];  Service: Endoscopy;;   right ear tube insertion  2011   TEE WITHOUT CARDIOVERSION N/A 05/15/2013   Procedure: TRANSESOPHAGEAL ECHOCARDIOGRAM (TEE);  Surgeon: Pamella Pert, MD;  Location[facility];  Service: Cardiovascular;  Laterality: N/A;   thryoid biopsy     TONSILLECTOMY AND ADENOIDECTOMY  04/01/2014   TONSILLECTOMY AND ADENOIDECTOMY Bilateral 04/01/2014   Procedure: BILATERAL TONSILLECTOMY AND ADENOIDECTOMY;  Surgeon: Darletta Moll, MD;  Location: [facility] OR;  Service: ENT;  Laterality: Bilateral;   TOTAL ABDOMINAL HYSTERECTOMY  2001   TOTAL KNEE ARTHROPLASTY Right 11/03/2015   TOTAL KNEE ARTHROPLASTY Right 11/03/2015   Procedure: RIGHT TOTAL KNEE ARTHROPLASTY/RIGHT;  Surgeon: Salvatore Marvel, MD;  [facility]: [facility];  Service: Orthopedics;  Laterality: Right;   TOTAL KNEE ARTHROPLASTY Left 02/07/2016   Procedure: TOTAL KNEE ARTHROPLASTY;  Surgeon: Salvatore Marvel, MD;  Location: [facility] OR;  Service: Orthopedics;  Laterality: Left;   TUBAL LIGATION  1990    Allergies: Aquacel [carboxymethylcellulose], Dextromethorphan, Nickel, Sulfonamide derivatives, Aspirin, Ciprofloxacin, Cosyntropin, Adhesive [tape], Amoxicillin, Latex, Moxifloxacin, and Nsaids  Medications: Prior to Admission medications   Medication Sig Start Date End Date Taking? Authorizing Provider  albuterol (VENTOLIN HFA) 108 (90 Base) MCG/ACT inhaler Inhale 1-2 puffs into the lungs every 6  (six) hours as needed for wheezing or shortness of breath.    [provider]  ALPRAZolam Prudy Feeler) 0.5 MG tablet Take 0.5 mg by mouth at bedtime. 12/29/20   [provider]  amLODipine (NORVASC) 5 MG tablet TAKE 1 TABLET BY MOUTH DAILY 09/25/23   Yates Decamp, MD  aspirin EC 81 MG tablet Take 81 mg by mouth at bedtime.    [provider]  azelastine (OPTIVAR) 0.05 % ophthalmic solution 1 drop 2 (two) times daily.    [provider]  Calcium-Phosphorus-Vitamin D (CITRACAL +D3 PO) Take 1 tablet by mouth 2 (two) times daily.    [provider]  hydrOXYzine (ATARAX) 10 MG tablet Take 1 tablet (10 mg total) by mouth 3 (three) times daily as needed for itching. 10/29/23 11/28/23  Dolores Frame, MD  ipratropium (ATROVENT) 0.03 % nasal spray Place 2 sprays into the nose in the morning and at bedtime. 05/18/21  [provider]  linaclotide (LINZESS) 145 MCG CAPS capsule Take 1 capsule (145 mcg total) by mouth daily. 10/23/23 04/20/24  Franky Macho, MD  nitroGLYCERIN (NITROSTAT) 0.4 MG SL tablet Place 1 tablet (0.4 mg total) under the tongue every 5 (five) minutes as needed for chest pain. 03/13/23 10/23/23  Yates Decamp, MD  OVER THE COUNTER MEDICATION Bio True eye drops    [provider]  pantoprazole (PROTONIX) 40 MG tablet Take 1 tablet (40 mg total) by mouth daily. 10/03/23 10/02/24  Franky Macho, MD  rosuvastatin (CRESTOR) 40 MG tablet Take 1 tablet (40 mg total) by mouth daily. 07/24/22 10/23/23  Nori Riis, NP  topiramate (TOPAMAX) 100 MG tablet Take 100 mg by mouth 2 (two) times daily. 04/10/21   [provider]  triamcinolone cream (KENALOG) 0.1 % Apply 1 application topically 2 (two) times daily as needed (skin irritation.).  07/09/19   [provider]     Family History  Problem Relation Age of Onset   Ovarian cancer Mother    Diabetes Maternal Grandmother    Arthritis Maternal Grandmother    Diabetes  Brother    Diabetes Paternal Grandmother    Diabetes Maternal Grandfather    Diabetes Paternal Grandfather    Heart disease Brother    Heart disease Other        Uncle   Breast cancer Maternal Aunt     Social History   Socioeconomic History   Marital status: Married    Spouse name: Onalee Hua   Number of children: 3   Years of education: College   Highest education level: Not on file  Occupational History   Occupation: Economist    Comment: Chiropodist     Employer: [employer]  Tobacco Use   Smoking status: Former    Current packs/day: 0.00    Average packs/day: 2.0 packs/day for 30.0 years (60.0 ttl pk-yrs)    Types: Cigarettes    Start date: 10/19/1969    Quit date: 10/20/1999    Years since quitting: 24.0   Smokeless tobacco: Never  Vaping Use   Vaping status: Never Used  Substance and Sexual Activity   Alcohol use: Yes    Comment: occ   Drug use: No   Sexual activity: Yes  Other Topics Concern   Not on file  Social History Narrative   Patient is married with 3 children.   Patient is right handed.   Patient has college education.   Caffeine Use: 1.5 cup of coffee in a.m   Social Drivers of Health   Financial Resource Strain: Medium Risk (05/18/2021)   Received from [facility] visits prior to 12/16/2022., [facility] visits prior to 12/16/2022.   Overall Financial Resource Strain (CARDIA)    Difficulty of Paying Living Expenses: Somewhat hard  Food Insecurity: Low Risk  (06/08/2023)   [facility]   Hunger Vital Sign    Worried About Running Out of Food in the Last Year: Never true    Ran Out of Food in the Last Year: Never true  Transportation Needs: No Transportation Needs (06/08/2023)   Received from [facility]    In the past 12 months, has lack of reliable transportation kept you from medical appointments, meetings, work or from getting things needed for daily living?  : No  Physical Activity: Inactive (05/18/2021)   Received from [facility] visits prior to 12/16/2022.,  [facility] visits prior to 12/16/2022.   Exercise Vital Sign    Days of Exercise per Week: 0 days    Minutes of Exercise per Session: 0 min  Stress: Stress Concern Present (05/18/2021)   Received from [facility] visits prior to 12/16/2022., [facility] visits prior to 12/16/2022.   [employer] of Occupational Health - Occupational Stress Questionnaire    Feeling of Stress : Very much  Social Connections: Socially Integrated (05/18/2021)   [facility] visits prior to 12/16/2022., [facility] visits prior to 12/16/2022.   Social Advertising account executive [NHANES]    Frequency of Communication with Friends and Family: More than three times a week    Frequency of Social Gatherings with Friends and Family: Once a week    Attends Religious Services: More than 4 times per year    Active Member of [employer] or Organizations: Yes    Attends Engineer, structural: More than 4 times per year    Marital Status: Married     Review of Systems: A 12 point ROS discussed and pertinent positives are indicated in the HPI above.  All other systems are negative.  Vital Signs: BP 139/79   Pulse 60   Temp 98.2 F (36.8 C) (Oral)   Resp 17   SpO2 94%    Physical Exam Vitals and nursing note reviewed.  Constitutional:      General: Patient is not in acute distress.    Appearance: Normal appearance. Patient is not ill-appearing.  HENT:     Head: Normocephalic and atraumatic.     Mouth/Throat:     Mouth: Mucous membranes are moist.     Pharynx: Oropharynx is clear.  Cardiovascular:     Rate and Rhythm: Normal rate and regular rhythm.     Pulses: Normal pulses.     Heart sounds: Normal heart sounds.  Pulmonary:     Effort: Pulmonary effort is normal.      Breath sounds: Normal breath sounds.  Abdominal:     General: Abdomen is flat. Bowel sounds are normal.     Palpations: Abdomen is soft.  Musculoskeletal:     Cervical back: Neck supple.  Skin:    General: Skin is warm and dry.     Coloration: Skin is not jaundiced or pale.  Neurological:     Mental Status: Patient is alert and oriented to person, place, and time.  Psychiatric:        Mood and Affect: Mood normal.        Behavior: Behavior normal.        Judgment: Judgment normal.    MD Evaluation Airway: WNL Heart: WNL Abdomen: WNL Chest/ Lungs: WNL ASA  Classification: 2 Mallampati/Airway Score: One  Imaging: No results found.  Labs:  CBC: Recent Labs    11/06/23 1131  WBC 7.0  HGB 15.2*  HCT 44.9  PLT 205    COAGS: Recent Labs    10/03/23 1312 11/06/23 1131  INR 0.9 1.0    BMP: Recent Labs    10/03/23 1312 10/23/23 1014  NA 141 144  K 3.8 4.1  CL 110 110*  CO2 19* 19*  GLUCOSE 95 101*  BUN 12 10  CALCIUM 9.5 9.2  CREATININE 0.60 0.66    LIVER FUNCTION TESTS: Recent Labs    10/03/23 1312 10/23/23 1014  BILITOT 0.6 0.3  AST 18 21  ALT 35* 49*  ALKPHOS  --  122*  PROT 6.9 6.6  ALBUMIN  --  4.2    TUMOR MARKERS: No results for input(s): "AFPTM", "CEA", "CA199", "CHROMGRNA" in the last 8760 hours.  Assessment and Plan: 63 y.o. female with elevated LFT and elevated mitochondrial antibodies who presents for random liver bx.   NPO since [place] VSS CBC stable  INR 1.0 CMP pending  On ASA 81 mg, has been held x 7 days  Has 12 allergies listed, not allergic to Versed. Fentanyl, or chlorhexidine. Allergic to Latex.   Risks and benefits of random liver bx was discussed with the patient and/or patient's family including, but not limited to bleeding, infection, damage to adjacent structures or low yield requiring additional tests.  All of the questions were answered and there is agreement to proceed.  Consent signed and in  chart.   Thank you for this interesting consult.  I greatly enjoyed meeting Niobe A Boardman and look forward to participating in their care.  A copy of this report was sent to the requesting provider on this date.  Electronically Signed: Willette Brace, PA-C 11/06/2023, 12:21 PM   I spent a total of  30 Minutes   in face to face in clinical consultation, greater than 50% of which was counseling/coordinating care for random liver bx.   This chart was dictated using voice recognition software.  Despite best efforts to proofread,  errors can occur which can change the documentation meaning.

## 2023-11-06 NOTE — Procedures (Signed)
Vascular and Interventional Radiology Procedure Note  Patient: [name] DOB: 1961/04/07 Medical Record Number: [id number] Note Date/Time: 11/06/23 12:29 PM   Performing Physician: Roanna Banning, MD Assistant(s): None  Diagnosis: >LFTs. Q PBC   Procedure: LIVER BIOPSY, NON-TARGETED  Anesthesia: Conscious Sedation Complications: None Estimated Blood Loss: Minimal Specimens: Sent for Pathology  Findings:  Successful Ultrasound-guided biopsy of liver. A total of 3 samples were obtained. Hemostasis of the tract was achieved using Manual Pressure.  Plan: Bed rest for 2 hours.  See detailed procedure note with images in PACS. The patient tolerated the procedure well without incident or complication and was returned to Recovery in stable condition.    Roanna Banning, MD [facility]   Pager. [phone] Clinic. [phone]

## 2023-11-06 NOTE — Discharge Instructions (Signed)
Discharge Instructions:   Please call Interventional Radiology clinic [phone] with any questions or concerns.  You may remove your dressing and shower tomorrow.  Moderate Conscious Sedation, Adult, Care After This sheet gives you information about how to care for yourself after your procedure. Your health care provider may also give you more specific instructions. If you have problems or questions, contact your health care provider. What can I expect after the procedure? After the procedure, it is common to have: Sleepiness for several hours. Impaired judgment for several hours. Difficulty with balance. Vomiting if you eat too soon. Follow these instructions at home: For the time period you were told by your health care provider: Rest. Do not participate in activities where you could fall or become injured. Do not drive or use machinery. Do not drink alcohol. Do not take sleeping pills or medicines that cause drowsiness. Do not make important decisions or sign legal documents. Do not take care of children on your own. Eating and drinking  Follow the diet recommended by your health care provider. Drink enough fluid to keep your urine pale yellow. If you vomit: Drink water, juice, or soup when you can drink without vomiting. Make sure you have little or no nausea before eating solid foods. General instructions Take over-the-counter and prescription medicines only as told by your health care provider. Have a responsible adult stay with you for the time you are told. It is important to have someone help care for you until you are awake and alert. Do not smoke. Keep all follow-up visits as told by your health care provider. This is important. Contact a health care provider if: You are still sleepy or having trouble with balance after 24 hours. You feel light-headed. You keep feeling nauseous or you keep vomiting. You develop a rash. You have a fever. You have redness or  swelling around the IV site. Get help right away if: You have trouble breathing. You have new-onset confusion at home. Summary After the procedure, it is common to feel sleepy, have impaired judgment, or feel nauseous if you eat too soon. Rest after you get home. Know the things you should not do after the procedure. Follow the diet recommended by your health care provider and drink enough fluid to keep your urine pale yellow. Get help right away if you have trouble breathing or new-onset confusion at home. This information is not intended to replace advice given to you by your health care provider. Make sure you discuss any questions you have with your health care provider. Document Revised: 01/30/2020 Document Reviewed: 08/28/2019 Elsevier Patient Education  2023 Elsevier Inc.   Liver Biopsy, Care After The following information offers guidance on how to care for yourself after your procedure. Your health care provider may also give you more specific instructions. If you have problems or questions, contact your health care provider. What can I expect after the procedure? After your procedure, it is common to: Have pain and soreness in the area where the biopsy was done. Have bruising around the area where the biopsy was done. Feel tired for 1-2 days. Follow these instructions at home: Medicines Take over-the-counter and prescription medicines only as told by your health care provider. If you were prescribed an antibiotic medicine, take it as told by your health care provider. Do not stop taking the antibiotic, even if you start to feel better. Do not take medicines, such as aspirin and ibuprofen, unless your doctor tells you to take them. Ask your   health care provider if the medicine prescribed to you: Requires you to avoid driving or using machinery. Can cause constipation. You may need to take these actions to prevent or treat constipation: Drink enough fluid to keep your urine pale  yellow. Take over-the-counter or prescription medicines. Eat foods that are high in fiber, such as beans, whole grains, and fresh fruits and vegetables. Limit foods that are high in fat and processed sugars, such as fried or sweet foods. Incision care Follow instructions from your health care provider about how to take care of your incisions. Make sure you: Wash your hands with soap and water for at least 20 seconds before and after you change your bandage (dressing). If soap and water are not available, use hand sanitizer. Change your dressing as told by your health care provider. Leave stitches (sutures), skin glue, or adhesive strips in place. These skin closures may need to stay in place for 2 weeks or longer. If adhesive strip edges start to loosen and curl up, you may trim the loose edges. Do not remove adhesive strips completely unless your health care provider tells you to do that. Check your incision areas every day for signs of infection. Check for: Redness, swelling, or more pain. Fluid or blood. Warmth. Pus or a bad smell. Do not take baths, swim, or use a hot tub until your health care provider says it is okay to do so. Activity Rest at home for 1-2 days, or as directed by your health care provider. Avoid sitting for a long time without moving. Get up to take short walks every 1-2 hours. This is important to improve blood flow and breathing. Ask for help if you feel weak or unsteady. Do not lift anything that is heavier than 10 lb (4.5 kg), or the limit that your health care provider tells you, until he or she says that it is safe. Do not play contact sports for 2 weeks after the procedure. Return to your normal activities as told by your health care provider. Ask your health care provider what activities are safe for you. General instructions  Do not drink alcohol in the first week after the procedure. Plan to have a responsible adult care for you for the time you are told after  you leave the hospital or clinic. This is important. It is up to you to get the results of your procedure. Ask your health care provider, or the department that is doing the procedure, when your results will be ready. Keep all follow-up visits. This is important. Contact a health care provider if: You have increased bleeding from an incision. You have redness, swelling, or increasing pain in any incisions. You notice a discharge or a bad smell coming from any of your incisions. You develop a rash. You have a fever or chills. Get help right away if: You develop swelling, bloating, or pain in your abdomen. You become dizzy or faint. You have nausea or vomiting. You have shortness of breath or difficulty breathing. You develop chest pain. You have problems with your speech or vision. You have trouble with your balance or moving your arms or legs. These symptoms may represent a serious problem that is an emergency. Do not wait to see if the symptoms will go away. Get medical help right away. Call your local emergency services (911 in the [place] Do not drive yourself to the hospital. Summary After the liver biopsy, it is common to have pain, soreness, and bruising in the area,   as well as tiredness (fatigue). Take over-the-counter and prescription medicines only as told by your health care provider. Follow instructions from your health care provider about how to care for your incisions. Check your incision areas daily for signs of infection. This information is not intended to replace advice given to you by your health care provider. Make sure you discuss any questions you have with your health care provider. Document Revised: 08/16/2020 Document Reviewed: 08/16/2020 Elsevier Patient Education  2023 Elsevier Inc.  

## 2023-11-07 LAB — SURGICAL PATHOLOGY

## 2023-11-08 NOTE — Progress Notes (Signed)
Will discuss with patient in the upcoming appointment :  A. LIVER, BIOPSY: - Minimally active steatohepatitis (grade 0-1 of 3), see comment - No pathologic fibrosis    COMMENT:  The biopsy shows liver parenchyma with a preserved architecture. Hepatic lobules show moderate steatosis (about 35-40%) with minimal ballooning degeneration. Only isolated foci of lobular necroinflammation are seen.  Scattered glycogenated nuclei are present, suggestive of insulin resistance.  Portal tracts are mostly unremarkable with preserved biliary and vascular structures and minimal inflammation. Iron stain shows minimal granular iron deposition within hepatocytes (0-1+) of uncertain significance. Trichrome and reticulin stains do not show any significant fibrosis. PASD stain shows no globular inclusions in hepatocytes. The findings are consistent with minimally active steatohepatitis, alcoholic and/or non-alcoholic, without any pathologic fibrosis.

## 2023-11-12 ENCOUNTER — Encounter (INDEPENDENT_AMBULATORY_CARE_PROVIDER_SITE_OTHER): Payer: Self-pay | Admitting: Gastroenterology

## 2023-11-12 ENCOUNTER — Ambulatory Visit (INDEPENDENT_AMBULATORY_CARE_PROVIDER_SITE_OTHER): Payer: Medicare Other | Admitting: Gastroenterology

## 2023-11-17 ENCOUNTER — Other Ambulatory Visit: Payer: Self-pay | Admitting: Cardiology

## 2023-11-17 DIAGNOSIS — I10 Essential (primary) hypertension: Secondary | ICD-10-CM

## 2023-11-24 ENCOUNTER — Other Ambulatory Visit: Payer: Self-pay | Admitting: Cardiology

## 2023-11-24 DIAGNOSIS — I10 Essential (primary) hypertension: Secondary | ICD-10-CM

## 2023-11-26 ENCOUNTER — Encounter (INDEPENDENT_AMBULATORY_CARE_PROVIDER_SITE_OTHER): Payer: Self-pay

## 2023-11-26 ENCOUNTER — Ambulatory Visit (INDEPENDENT_AMBULATORY_CARE_PROVIDER_SITE_OTHER): Payer: Medicare Other | Admitting: Gastroenterology

## 2023-11-26 ENCOUNTER — Encounter (INDEPENDENT_AMBULATORY_CARE_PROVIDER_SITE_OTHER): Payer: Self-pay | Admitting: Gastroenterology

## 2023-11-26 VITALS — BP 113/76 | HR 87 | Temp 97.1°F | Ht 65.0 in | Wt 188.3 lb

## 2023-11-26 DIAGNOSIS — R748 Abnormal levels of other serum enzymes: Secondary | ICD-10-CM | POA: Diagnosis not present

## 2023-11-26 DIAGNOSIS — R768 Other specified abnormal immunological findings in serum: Secondary | ICD-10-CM

## 2023-11-26 DIAGNOSIS — K743 Primary biliary cirrhosis: Secondary | ICD-10-CM | POA: Insufficient documentation

## 2023-11-26 DIAGNOSIS — R1011 Right upper quadrant pain: Secondary | ICD-10-CM | POA: Insufficient documentation

## 2023-11-26 DIAGNOSIS — K5904 Chronic idiopathic constipation: Secondary | ICD-10-CM

## 2023-11-26 DIAGNOSIS — L299 Pruritus, unspecified: Secondary | ICD-10-CM

## 2023-11-26 DIAGNOSIS — R1031 Right lower quadrant pain: Secondary | ICD-10-CM | POA: Insufficient documentation

## 2023-11-26 DIAGNOSIS — K760 Fatty (change of) liver, not elsewhere classified: Secondary | ICD-10-CM

## 2023-11-26 DIAGNOSIS — R1013 Epigastric pain: Secondary | ICD-10-CM

## 2023-11-26 MED ORDER — URSODIOL 300 MG PO CAPS: 300.0000 mg | ORAL_CAPSULE | Freq: Three times a day (TID) | ORAL | 5 refills | Status: DC

## 2023-11-26 NOTE — Patient Instructions (Signed)
 It was very nice to meet you today, as dicussed with will plan for the following :  1) ursodiol   2) CT Abdomen and Pelvis

## 2023-11-26 NOTE — Progress Notes (Signed)
 [name] [name] , M.D. Gastroenterology & Hepatology [facility] [address] [place], [place] [address] Primary Care Physician: Lory Rough., PA-C [address] [place] [address]  Chief Complaint: Altered bowel movements, abdominal pain and elevated liver enzymes  History of Present Illness: [name] is a 63 y.o. female with epilepsy, COPD, CVA, rheumatoid arthritis on adalimumab who presents for evaluation of Altered bowel movements, abdominal pain and elevated liver enzymes.  Patient reports that most of the day she was having a liquid bowel movement followed by hard stools every 1 to 2 days, with sense of incomplete evacuation.  Reports her stools are yellow in color liquid but denies any blood in stool.  Patient has postprandial abdominal pain which is new since past 6 months.  Although her complaints are not much related to abdominal pain rather diarrhea. Patient reports that she has not started adalimumab or any new medications as of yet.  Last took leflunomide 6 months ago, taking intermittently prednisone.  Patient denies any herbal use or any new medications at  Also on further questioning today patient has significant fatigue and generalized pruritus  Last [id number] Last Colonoscopy: 2021 normal colonoscopy suggest repeat 10 years  FHx: neg for any gastrointestinal/liver disease, no malignancies Social: neg smoking, alcohol or illicit drug use Surgical: Appendectomy, cholecystectomy, hernia repair, hysterectomy  Past Medical History: Past Medical History:  Diagnosis Date   Allergic rhinitis    uses Flonase  daily as needed and takes CLaritin  daily   Anxiety    takes Xanax  daily as needed   Asthma    Albuterol  inhaler prn;SYmbicort  daily   Cerebral vascular malformation    sees dr Larrie Po for monitoring as needed, sees dr lewitt for headaches every 4 months   COPD (chronic obstructive pulmonary disease)  (HCC)    Depression    takes Citalopram  daily   Diverticulitis at age 57   Eczema    Fibromyalgia    GERD (gastroesophageal reflux disease)    takes Protonix  daily   Hard of hearing    History of kidney stones    History of migraine    last one 10+yrs ago   History of staph infection 46yrs ago   Hyperlipidemia    takes Pravastatin  daily   IBS (irritable bowel syndrome)    mixed   Insomnia    Joint pain    Lactose intolerance    Nausea    takes Zofran  daily as needed   PFO (patent foramen ovale)    Plaque psoriasis    PONV (postoperative nausea and vomiting)    Postoperative anemia due to acute blood loss 02/08/2016   Primary localized osteoarthritis of left knee    Primary localized osteoarthritis of right knee 11/03/2015   Rheumatoid arthritis(714.0) 10/16/2008   oa and ra;Rhemicade IV every 6wks and Metotrexate weekly   Seizures (HCC) 6months ago 03/21/14   takes Depakote  daily   Shortness of breath    with exertion   Sleep apnea    study done >71yrs ago;uses CPAP nightly   Spinal headache    patient states that she thinks she had a spinal headache a long time ago   Stress incontinence    Stroke (HCC) 03/26/2013   left sided weakness   Thyroid  cyst     Past Surgical History: Past Surgical History:  Procedure Laterality Date   APPENDECTOMY  1981   BIOPSY  12/16/2019   Procedure: BIOPSY;  Surgeon: Tami Falcon, MD;  Location: [facility] ENDOS[facility];  Service: Endoscopy;;   BIOPSY  07/19/2023   Procedure: BIOPSY;  Surgeon: Hargis Lias, MD;  Location: [facility];  Service: Endoscopy;;   CARDIAC CATHETERIZATION  2004   CHOLECYSTECTOMY  1981   COLONOSCOPY     COLONOSCOPY WITH PROPOFOL  N/A 12/16/2019   Procedure: COLONOSCOPY WITH PROPOFOL ;  Surgeon: Tami Falcon, MD;  Location: [facility];  Service: Endoscopy;  Laterality: N/A;   CORONARY PRESSURE/FFR STUDY N/A 07/25/2022   Procedure: INTRAVASCULAR PRESSURE WIRE/FFR STUDY;  Surgeon: Cody Das, MD;  Location: [facility]  INVASIVE CV LAB;  Service: Cardiovascular;  Laterality: N/A;   ESOPHAGOGASTRODUODENOSCOPY     ESOPHAGOGASTRODUODENOSCOPY (EGD) WITH PROPOFOL  N/A 07/19/2023   Procedure: ESOPHAGOGASTRODUODENOSCOPY (EGD) WITH PROPOFOL ;  Surgeon: Hargis Lias, MD;  [facility]: [facility];  Service: Endoscopy;  Laterality: N/A;  12:45;asa 1-2, bumped to 1:00 for lunch to fit - messaged Tanya   FOOT SURGERY  1999   right ankle   HERNIA REPAIR  2006   x2   INCISIONAL HERNIA REPAIR  05/20/2012   Procedure: HERNIA REPAIR INCISIONAL;  Surgeon: Brandy Cal. Cornett, MD;  Location: [facility] ORS;  Service: General;  Laterality: N/A;   INCONTINENCE SURGERY  2010   sling done    KIDNEY STONE SURGERY  2001   KNEE ARTHROSCOPY  1992   left   left foot plating and scarping for arthritis  2011   LEFT HEART CATH AND CORONARY ANGIOGRAPHY N/A 07/25/2022   Procedure: LEFT HEART CATH AND CORONARY ANGIOGRAPHY;  Surgeon: Cody Das, MD;  [facility]: [facility] INVASIVE CV LAB;  Service: Cardiovascular;  Laterality: N/A;   POLYPECTOMY  07/19/2023   Procedure: POLYPECTOMY;  Surgeon: Hargis Lias, MD;  [facility];  Service: Endoscopy;;   right ear tube insertion  2011   TEE WITHOUT CARDIOVERSION N/A 05/15/2013   Procedure: TRANSESOPHAGEAL ECHOCARDIOGRAM (TEE);  Surgeon: Jessica Morn, MD;  Location: [facility] ENDOSCOPY;  Service: Cardiovascular;  Laterality: N/A;   thryoid biopsy     TONSILLECTOMY AND ADENOIDECTOMY  04/01/2014   TONSILLECTOMY AND ADENOIDECTOMY Bilateral 04/01/2014   Procedure: BILATERAL TONSILLECTOMY AND ADENOIDECTOMY;  Surgeon: Lawence Press, MD;  [facility];  Service: ENT;  Laterality: Bilateral;   TOTAL ABDOMINAL HYSTERECTOMY  2001   TOTAL KNEE ARTHROPLASTY Right 11/03/2015   TOTAL KNEE ARTHROPLASTY Right 11/03/2015   Procedure: RIGHT TOTAL KNEE ARTHROPLASTY/RIGHT;  Surgeon: Elly Habermann, MD;  Location: [facility] OR;  Service: Orthopedics;  Laterality: Right;   TOTAL KNEE ARTHROPLASTY Left 02/07/2016    Procedure: TOTAL KNEE ARTHROPLASTY;  Surgeon: Elly Habermann, MD;  Location: [facility] OR;  Service: Orthopedics;  Laterality: Left;   TUBAL LIGATION  1990    Family History: Family History  Problem Relation Age of Onset   Ovarian cancer Mother    Diabetes Maternal Grandmother    Arthritis Maternal Grandmother    Diabetes Brother    Diabetes Paternal Grandmother    Diabetes Maternal Grandfather    Diabetes Paternal Grandfather    Heart disease Brother    Heart disease Other        Uncle   Breast cancer Maternal Aunt     Social History: Social History   Tobacco Use  Smoking Status Former   Current packs/day: 0.00   Average packs/day: 2.0 packs/day for 30.0 years (60.0 ttl pk-yrs)   Types: Cigarettes   Start date: 10/19/1969   Quit date: 10/20/1999   Years since quitting: 24.1  Smokeless Tobacco Never   Social History  Substance and Sexual Activity  Alcohol Use Yes   Comment: occ   Social History   Substance and Sexual Activity  Drug Use No    Allergies: Allergies  Allergen Reactions   Aquacel [Carboxymethylcellulose] Other (See Comments)    Blisters    Dextromethorphan Anaphylaxis   Nickel Rash    Turn read   Sulfonamide Derivatives Anaphylaxis, Hives and Swelling    REACTION: swelling, tongue and hives   Aspirin  Nausea And Vomiting    REACTION: severe gi upset.  Pt states she can tolerate 81 mg asa only.    Ciprofloxacin Itching and Swelling    REACTION: angioedema, urticaria   Cosyntropin Hives and Swelling    REACTION: swelling, hives   Adhesive [Tape] Other (See Comments)    Blisters skin   Amoxicillin Nausea And Vomiting    REACTION: GI upset   Latex Rash    Blisters   Moxifloxacin Other (See Comments)    gi upset   Nsaids Other (See Comments)    Gi upset    Medications: Current Outpatient Medications  Medication Sig Dispense Refill   albuterol  (VENTOLIN  HFA) 108 (90 Base) MCG/ACT inhaler Inhale 1-2 puffs into the lungs every 6 (six) hours as  needed for wheezing or shortness of breath.     ALPRAZolam  (XANAX ) 0.5 MG tablet Take 0.5 mg by mouth at bedtime.     amLODipine  (NORVASC ) 5 MG tablet Take 1 tablet (5 mg total) by mouth daily. Pt needs to schedule appt with Dr. Berry Bristol for further refills - 2nd attempt 15 tablet 0   aspirin  EC 81 MG tablet Take 81 mg by mouth at bedtime.     azelastine (OPTIVAR) 0.05 % ophthalmic solution 1 drop 2 (two) times daily.     Calcium -Phosphorus-Vitamin D (CITRACAL +D3 PO) Take 1 tablet by mouth 2 (two) times daily.     hydrOXYzine  (ATARAX ) 10 MG tablet Take 1 tablet (10 mg total) by mouth 3 (three) times daily as needed for itching. 60 tablet 0   ipratropium (ATROVENT) 0.03 % nasal spray Place 2 sprays into the nose in the morning and at bedtime.     linaclotide  (LINZESS ) 145 MCG CAPS capsule Take 1 capsule (145 mcg total) by mouth daily. 90 capsule 1   nitroGLYCERIN  (NITROSTAT ) 0.4 MG SL tablet Place 1 tablet (0.4 mg total) under the tongue every 5 (five) minutes as needed for chest pain. 25 tablet 3   OVER THE COUNTER MEDICATION Bio True eye drops daily.     pantoprazole  (PROTONIX ) 40 MG tablet Take 1 tablet (40 mg total) by mouth daily. 100 tablet 1   pravastatin  (PRAVACHOL ) 40 MG tablet Take 40 mg by mouth daily.     topiramate  (TOPAMAX ) 100 MG tablet Take 100 mg by mouth 2 (two) times daily.     triamcinolone cream (KENALOG) 0.1 % Apply 1 application topically 2 (two) times daily as needed (skin irritation.).      No current facility-administered medications for this visit.    Review of Systems: GENERAL: negative for malaise, night sweats HEENT: No changes in hearing or vision, no nose bleeds or other nasal problems. NECK: Negative for lumps, goiter, pain and significant neck swelling RESPIRATORY: Negative for cough, wheezing CARDIOVASCULAR: Negative for chest pain, leg swelling, palpitations, orthopnea GI: SEE HPI MUSCULOSKELETAL: Negative for joint pain or swelling, back pain, and muscle  pain. SKIN: Negative for lesions, rash HEMATOLOGY Negative for prolonged bleeding, bruising easily, and swollen nodes. ENDOCRINE: Negative for cold or heat intolerance,  polyuria, polydipsia and goiter. NEURO: negative for tremor, gait imbalance, syncope and seizures. The remainder of the review of systems is noncontributory.   Physical Exam: BP 113/76 (BP Location: Left Arm, Patient Position: Sitting, Cuff Size: Normal)   Pulse 87   Temp (!) 97.1 F (36.2 C) (Temporal)   Ht 5\' 5"  (1.651 m)   Wt 188 lb 4.8 oz (85.4 kg)   BMI 31.33 kg/m  GENERAL: The patient is AO x3, in no acute distress. HEENT: Head is normocephalic and atraumatic. EOMI are intact. Mouth is well hydrated and without lesions. NECK: Supple. No masses LUNGS: Clear to auscultation. No presence of rhonchi/wheezing/rales. Adequate chest expansion HEART: RRR, normal s1 and s2. ABDOMEN: Soft, RUQ/RLQ tenderness, no guarding, no peritoneal signs, and nondistended. BS +. No masses. EXTREMITIES: Without any cyanosis, clubbing, rash, lesions or edema. NEUROLOGIC: AOx3, no focal motor deficit. SKIN: no jaundice, no rashes   Imaging/Labs: as above     Latest Ref Rng & Units 11/06/2023   11:31 AM 07/24/2022   11:02 AM 04/26/2018    9:59 AM  CBC  WBC 4.0 - 10.5 K/uL 7.0  5.4  7.9   Hemoglobin 12.0 - 15.0 g/dL 16.1  09.6  04.5   Hematocrit 36.0 - 46.0 % 44.9  44.2  39.9   Platelets 150 - 400 K/uL 205  176  198    Lab Results  Component Value Date   IRON 115 10/03/2023   TIBC 312 10/03/2023   FERRITIN 252 10/03/2023    I personally reviewed and interpreted the available labs, imaging and endoscopic files.    Liver enzymes from 06/21/2023 ALT 69 AST 41 alk phos 92 T. bili 0.6 07/04/2023  IMPRESSION:  1. Status post cholecystectomy.  2. Increased hepatic parenchymal echogenicity suggestive of  steatosis.   Negative celiac panel 2022  Negative ASMA, HIV, ANA Negative hep C, negative hep B surface antigen, hep B  nonimmune nonexposed Hep A immune Ferritin 252 INR 0.9 ALT 35 alk phos 96 AMA significantly 65.2   Liver biopsy  - Minimally active steatohepatitis (grade 0-1 of 3), see comment - No pathologic fibrosis    COMMENT:   The biopsy shows liver parenchyma with a preserved architecture. Hepatic lobules show moderate steatosis (about 35-40%) with minimal ballooning degeneration. Only isolated foci of lobular necroinflammation are seen.  Scattered glycogenated nuclei are present, suggestive of insulin resistance.  Portal tracts are mostly unremarkable with preserved biliary and vascular structures and minimal inflammation. Iron stain shows minimal granular iron deposition within hepatocytes (0-1+) of uncertain significance. Trichrome and reticulin stains do not show any significant fibrosis. PASD stain shows no globular inclusions in hepatocytes. The findings are consistent with minimally active steatohepatitis, alcoholic and/or non-alcoholic, without any pathologic fibrosis.   ALP elevated for the first time : 122 ALT: 49 remains elevated  GGT : 28  IgM: 128 ( normal) Anti GP 210: negative   Impression and Plan:  [name] is a 63 y.o. female with epilepsy, COPD, CVA, rheumatoid arthritis on adalimumab who presents for evaluation of Altered bowel movements, abdominal pain and elevated liver enzymes.  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  She has not started  adalimumab but was previously on leflunomide which was stopped 6 months ago  Leflunomide likelihood score: B (well known  cause of idiosyncratic clinically apparent liver injury as well as reactivation of hepatitis B). Adalimumab Likelihood score: B (highly likely cause of clinically apparent liver injury). Rosuvastatin  Likelihood score: A (likely cause of clinically apparent liver injury). Topiramate  Likelihood score: C (probable rare cause of clinically apparent liver injury).  At this time I would not stop the statin or hold on starting new medication by rheumatology including Humira, with close monitoring of her liver enzymes and if significant increase than would change medications. Patient currently is NOT on any maintenance therapy and hence this is unlikely DILI   Further questioning patient does have significant fatigue and pruritus which can be seen in patient with PBC.  Although interestingly her ALP  has always been consistently normal except one recording of 122  Liver biopsy only suggest steatohepatitis  Recs:  We discussed with patient utility of liver biopsy given normal alk phos and significant positive AMA in setting of elevated ALT to rule out PBC-AIH overlap.  Patient agreed to proceed with liver biopsy  Studies have shown evidence positive patient with normal alk phos found to have evidence of PBC well before ALP elevation   Ronnald Coil S, Li S, Murphy Arn, Ma H, Ou X, You H, Jia J. The future risk of primary biliary cholangitis (PBC) is low among patients with incidental anti-mitochondrial antibodies but without baseline PBC. Hepatol Commun. 2022 Nov;6(11):3112-3119. doi: 10.1002/hep4.2067. Epub 2022 Aug 23. PMID: [id number]; PMCID: [id number].)  Also importantly patient has rheumatoid arthritis and is not on any maintenance therapy.  Patient will follow-up with rheumatology.  Patient may be started on maintenance therapy with close monitoring of liver enzymes as slight elevation liver enzymes are likely due to other underlying MASLD   Given generalized pruritus ,  significant elevated AMA and slight elevation in ALP , although biopsies are negative for PBC-AIH ( this can be a patchy or very early disease) will treat clinically as PBC  Urosdiol will treat patient generalized pruritus and delay progression to end-stage renal disease and transplant if she truly has underlying PBC  Liver tests every 3?6 months TSH annually Bone mineral densitometry every 2 years Vitamins A, D, E and prothrombin time annually if bilirubin >2.0  -UDCA in a dose of 13 to 15 mg/kg/day orally    #MASLD  Risk factors for MASLD with  BMI 31 and prediabetes hemoglobin A1c 5.8 and mixed hyperlipidemia   Recommendation :     - walking at a brisk pace/biking at moderate intesity 2.5-5 hours per week     - use pedometer/step counter to track activity     - goal to lose 5-10% of initial body weight     - avoid suagry drinks and juices, use zero calorie beverages     - increase water intake     - eat a low carb diet with plenty of veggies and fruit     - Get sufficient sleep 7-8 hrs nightly     - maitain active lifestyle     - avoid alcohol     - recommend 2-3 cups Coffee daily     - Counsel on lowering cholesterol by having a diet rich in vegetables,          protein (avoid red meats) and good fats(fish, salmon).  #Alternating Bowel movements # Chronic idiopathic constipation #Abdominal  pain   Recent x-ray with large amount of stool burden.  Patient felt well after bowel purge but is unable to tolerate MiraLAX  and Metamucil on a consistent basis and continues to be because of nausea   Was given Linzess  , symptoms improved but could not afford it ; Linzess  290  mcg to improve global symptoms as well as constipation as patient has not responded much to Metamucil and MiraLAX  challenge Ensure adequate fluid intake: Aim for 8 glasses of water daily.  Given persistent RUQ and RLQ pain and tenderness on exam , will obtain CT Abdomen and pelvis and if negative will refer for  trigger point injection for abdominal wall pain syndrome given positive carnet signs and pain near the incision site  HCM: Last colonoscopy 2021, suggest repeat 10 years  All questions were answered.      Silus Lanzo Gwendolyn Emrys Mck[place], MD Gastroenterology and Hepatology [facility]   This chart has been completed using [facility] Dictation software, and while attempts have been made to ensure accuracy , certain words and phrases may not be transcribed as intended

## 2023-12-05 ENCOUNTER — Encounter (INDEPENDENT_AMBULATORY_CARE_PROVIDER_SITE_OTHER): Payer: Self-pay | Admitting: Gastroenterology

## 2023-12-06 ENCOUNTER — Ambulatory Visit (HOSPITAL_COMMUNITY)
Admission: RE | Admit: 2023-12-06 | Discharge: 2023-12-06 | Disposition: A | Discharge status: Home / Self Care | Payer: Medicare Other | Source: Ambulatory Visit | Attending: Gastroenterology | Admitting: Gastroenterology

## 2023-12-06 DIAGNOSIS — R1031 Right lower quadrant pain: Secondary | ICD-10-CM | POA: Diagnosis present

## 2023-12-06 DIAGNOSIS — R748 Abnormal levels of other serum enzymes: Secondary | ICD-10-CM | POA: Insufficient documentation

## 2023-12-06 DIAGNOSIS — R1011 Right upper quadrant pain: Secondary | ICD-10-CM | POA: Diagnosis present

## 2023-12-06 MED ORDER — IOHEXOL 300 MG/ML  SOLN
100.0000 mL | Freq: Once | INTRAMUSCULAR | Status: AC | PRN
  Administered 2023-12-06: 100 mL via INTRAVENOUS

## 2023-12-07 ENCOUNTER — Other Ambulatory Visit (INDEPENDENT_AMBULATORY_CARE_PROVIDER_SITE_OTHER): Payer: Self-pay | Admitting: *Deleted

## 2023-12-07 ENCOUNTER — Encounter (INDEPENDENT_AMBULATORY_CARE_PROVIDER_SITE_OTHER): Payer: Self-pay | Admitting: *Deleted

## 2023-12-07 DIAGNOSIS — K743 Primary biliary cirrhosis: Secondary | ICD-10-CM

## 2023-12-07 DIAGNOSIS — R748 Abnormal levels of other serum enzymes: Secondary | ICD-10-CM

## 2023-12-07 MED ORDER — URSODIOL 300 MG PO CAPS: 300.0000 mg | ORAL_CAPSULE | Freq: Three times a day (TID) | ORAL | 5 refills | Status: DC

## 2023-12-07 NOTE — Telephone Encounter (Signed)
Called patient and discussed with her if she just wanted a 30 day til mail order can in because Dr. Tasia Catchings sent in 6 months worth to [facility] on 11/26/23 and she said it was too expensive at [facility] and to cancel at [facility] and send to [facility]. I called [facility] and canceled the refills sent on 2/10 and sent them to [facility]. Pt was notified.

## 2023-12-10 ENCOUNTER — Encounter (INDEPENDENT_AMBULATORY_CARE_PROVIDER_SITE_OTHER): Payer: Self-pay | Admitting: Gastroenterology

## 2023-12-10 NOTE — Progress Notes (Signed)
 Ct Abdomen and pelvis:  IMPRESSION: 1. No acute abnormality in the abdomen or pelvis. 2. Diffuse hepatic steatosis. 3. Nonobstructive left lower pole renal stones measure up to 3 mm. 4. Colonic diverticulosis without findings of acute diverticulitis. 5.  Aortic Atherosclerosis (ICD10-I70.0).

## 2023-12-12 ENCOUNTER — Encounter (INDEPENDENT_AMBULATORY_CARE_PROVIDER_SITE_OTHER): Payer: Self-pay | Admitting: Gastroenterology

## 2023-12-12 NOTE — Progress Notes (Signed)
 CT Abdomen   IMPRESSION: 1. No acute abnormality in the abdomen or pelvis. 2. Diffuse hepatic steatosis. 3. Nonobstructive left lower pole renal stones measure up to 3 mm. 4. Colonic diverticulosis without findings of acute diverticulitis. 5.  Aortic Atherosclerosis (ICD10-I70.0).

## 2024-01-10 NOTE — Progress Notes (Deleted)
 Cardiology Office Note    Patient Name: [name] Date of Encounter: 01/10/2024  Primary Care Provider:  Richmond Campbell., PA-C Primary Cardiologist:  None Primary Electrophysiologist: None   Past Medical History    Past Medical History:  Diagnosis Date   Allergic rhinitis    uses Flonase daily as needed and takes CLaritin daily   Anxiety    takes Xanax daily as needed   Asthma    Albuterol inhaler prn;SYmbicort daily   Cerebral vascular malformation    sees dr Lovell Sheehan for monitoring as needed, sees dr lewitt for headaches every 4 months   COPD (chronic obstructive pulmonary disease) (HCC)    Depression    takes Citalopram daily   Diverticulitis at age 32   Eczema    Fibromyalgia    GERD (gastroesophageal reflux disease)    takes Protonix daily   Hard of hearing    History of kidney stones    History of migraine    last one 10+yrs ago   History of staph infection 68yrs ago   Hyperlipidemia    takes Pravastatin daily   IBS (irritable bowel syndrome)    mixed   Insomnia    Joint pain    Lactose intolerance    Nausea    takes Zofran daily as needed   PFO (patent foramen ovale)    Plaque psoriasis    PONV (postoperative nausea and vomiting)    Postoperative anemia due to acute blood loss 02/08/2016   Primary localized osteoarthritis of left knee    Primary localized osteoarthritis of right knee 11/03/2015   Rheumatoid arthritis(714.0) 10/16/2008   oa and ra;Rhemicade IV every 6wks and Metotrexate weekly   Seizures (HCC) 6months ago 03/21/14   takes Depakote daily   Shortness of breath    with exertion   Sleep apnea    study done >26yrs ago;uses CPAP nightly   Spinal headache    patient states that she thinks she had a spinal headache a long time ago   Stress incontinence    Stroke (HCC) 03/26/2013   left sided weakness   Thyroid cyst     History of Present Illness  [name] is a 63 y.o. female with a PMH of embolic left-sided CVA with residual  weakness, 03/2013, PFO, COPD, epilepsy, rheumatoid arthritis, GERD, HLD, aortic atherosclerosis, coronary calcifications who presents today for follow-up.  [name] has been followed by Dr. Jacinto Halim since 2014 when she suffered an embolic left-sided CVA with 2 TEE revealing PFO with bidirectional shunting.  She was randomized to the medical arm of the reduced trial with no recurrence of stroke.  She underwent both echo and stress testing in 2018 that showed intermediate risk with possible septal ischemia and normal EF.  She had coronary calcifications on coronary CTA completed in January 2019.  She is currently followed by Dr. Maple Hudson for history of COPD and tested positive for OSA on previous sleep study in 2014.  She underwent a Lexiscan Myoview on 06/2021 that was normal and low risk.  2D echo was also completed showing EF of 55-60% with no RWMA and mild pulmonic regurgitation.  She was seen on 07/03/2022 and reported 1 month of exertional chest discomfort relieved with nitroglycerin.  She underwent LHC for further evaluation that showed normal coronaries and no angiographic cause for chest pain.  Patient denies chest pain, palpitations, dyspnea, PND, orthopnea, nausea, vomiting, dizziness, syncope, edema, weight gain, or early satiety.   Discussed the use of  AI scribe software for clinical note transcription with the patient, who gave verbal consent to proceed.  History of Present Illness    ***Notes: -Last ischemic evaluation:  Review of Systems  Please see the history of present illness.    All other systems reviewed and are otherwise negative except as noted above.  Physical Exam    Wt Readings from Last 3 Encounters:  11/26/23 188 lb 4.8 oz (85.4 kg)  11/06/23 188 lb (85.3 kg)  10/23/23 193 lb 9.6 oz (87.8 kg)   UX:LKGMW were no vitals filed for this visit.,There is no height or weight on file to calculate BMI. GEN: Well nourished, well developed in no acute distress Neck: No JVD; No  carotid bruits Pulmonary: Clear to auscultation without rales, wheezing or rhonchi  Cardiovascular: Normal rate. Regular rhythm. Normal S1. Normal S2.   Murmurs: There is no murmur.  ABDOMEN: Soft, non-tender, non-distended EXTREMITIES:  No edema; No deformity   EKG/LABS/ Recent Cardiac Studies   ECG personally reviewed by me today - ***  Risk Assessment/Calculations:   {Does this patient have ATRIAL FIBRILLATION?:[id number]}      Lab Results  Component Value Date   WBC 7.0 11/06/2023   HGB 15.2 (H) 11/06/2023   HCT 44.9 11/06/2023   MCV 95.9 11/06/2023   PLT 205 11/06/2023   Lab Results  Component Value Date   CREATININE 0.60 11/06/2023   BUN 10 11/06/2023   NA 136 11/06/2023   K 4.6 11/06/2023   CL 111 11/06/2023   CO2 15 (L) 11/06/2023   Lab Results  Component Value Date   CHOL 139 03/27/2013   HDL 23 (L) 03/27/2013   LDLCALC 89 03/27/2013   TRIG 135 03/27/2013   CHOLHDL 6.0 03/27/2013    Lab Results  Component Value Date   HGBA1C 5.6 03/27/2013   Assessment & Plan    1.  Coronary calcifications  2.  History of CVA:  3.  Hyperlipidemia:  4.  History of COPD:  5.Hx of PFO      Disposition: Follow-up with None or APP in *** months {Are you ordering a CV Procedure (e.g. stress test, cath, DCCV, TEE, etc)?   Press F2        :[id number]}   Signed, Napoleon Form, Leodis Rains, NP 01/10/2024, 1:31 PM [facility]

## 2024-01-11 ENCOUNTER — Ambulatory Visit: Payer: Medicare Other | Admitting: Nurse Practitioner

## 2024-01-11 DIAGNOSIS — E7800 Pure hypercholesterolemia, unspecified: Secondary | ICD-10-CM

## 2024-01-11 DIAGNOSIS — I63449 Cerebral infarction due to embolism of unspecified cerebellar artery: Secondary | ICD-10-CM

## 2024-01-11 DIAGNOSIS — J449 Chronic obstructive pulmonary disease, unspecified: Secondary | ICD-10-CM

## 2024-01-11 DIAGNOSIS — I2510 Atherosclerotic heart disease of native coronary artery without angina pectoris: Secondary | ICD-10-CM

## 2024-01-11 DIAGNOSIS — Q2112 Patent foramen ovale: Secondary | ICD-10-CM

## 2024-01-14 ENCOUNTER — Other Ambulatory Visit: Payer: Self-pay | Admitting: Cardiology

## 2024-01-14 DIAGNOSIS — I10 Essential (primary) hypertension: Secondary | ICD-10-CM

## 2024-01-17 ENCOUNTER — Other Ambulatory Visit: Payer: Self-pay | Admitting: Cardiology

## 2024-01-17 DIAGNOSIS — I10 Essential (primary) hypertension: Secondary | ICD-10-CM

## 2024-02-02 ENCOUNTER — Other Ambulatory Visit: Payer: Self-pay | Admitting: Cardiology

## 2024-02-02 DIAGNOSIS — I10 Essential (primary) hypertension: Secondary | ICD-10-CM

## 2024-02-05 ENCOUNTER — Other Ambulatory Visit (INDEPENDENT_AMBULATORY_CARE_PROVIDER_SITE_OTHER): Payer: Self-pay | Admitting: Gastroenterology

## 2024-02-06 NOTE — Telephone Encounter (Signed)
 Last OV 11/26/23

## 2024-02-11 ENCOUNTER — Other Ambulatory Visit: Payer: Self-pay | Admitting: Cardiology

## 2024-02-11 DIAGNOSIS — I10 Essential (primary) hypertension: Secondary | ICD-10-CM

## 2024-02-20 NOTE — Progress Notes (Deleted)
 Cardiology Clinic Note   Patient Name: [name] Date of Encounter: 02/20/2024  Primary Care Provider:  Lory Rough., PA-C Primary Cardiologist:  None  Patient Profile    [name] 63 year old female presents to the clinic today for follow-up evaluation of her chest pain and palpitations.  Past Medical History    Past Medical History:  Diagnosis Date   Allergic rhinitis    uses Flonase  daily as needed and takes CLaritin  daily   Anxiety    takes Xanax  daily as needed   Asthma    Albuterol  inhaler prn;SYmbicort  daily   Cerebral vascular malformation    sees dr Larrie Po for monitoring as needed, sees dr lewitt for headaches every 4 months   COPD (chronic obstructive pulmonary disease) (HCC)    Depression    takes Citalopram  daily   Diverticulitis at age 29   Eczema    Fibromyalgia    GERD (gastroesophageal reflux disease)    takes Protonix  daily   Hard of hearing    History of kidney stones    History of migraine    last one 10+yrs ago   History of staph infection 86yrs ago   Hyperlipidemia    takes Pravastatin  daily   IBS (irritable bowel syndrome)    mixed   Insomnia    Joint pain    Lactose intolerance    Nausea    takes Zofran  daily as needed   PFO (patent foramen ovale)    Plaque psoriasis    PONV (postoperative nausea and vomiting)    Postoperative anemia due to acute blood loss 02/08/2016   Primary localized osteoarthritis of left knee    Primary localized osteoarthritis of right knee 11/03/2015   Rheumatoid arthritis(714.0) 10/16/2008   oa and ra;Rhemicade IV every 6wks and Metotrexate weekly   Seizures (HCC) 6months ago 03/21/14   takes Depakote  daily   Shortness of breath    with exertion   Sleep apnea    study done >48yrs ago;uses CPAP nightly   Spinal headache    patient states that she thinks she had a spinal headache a long time ago   Stress incontinence    Stroke (HCC) 03/26/2013   left sided weakness   Thyroid  cyst     Past Surgical History:  Procedure Laterality Date   APPENDECTOMY  1981   BIOPSY  12/16/2019   Procedure: BIOPSY;  Surgeon: Tami Falcon, MD;  Location: [facility];  Service: Endoscopy;;   BIOPSY  07/19/2023   Procedure: BIOPSY;  Surgeon: Hargis Lias, MD;  [facility]: [facility];  Service: Endoscopy;;   CARDIAC CATHETERIZATION  2004   CHOLECYSTECTOMY  1981   COLONOSCOPY     COLONOSCOPY WITH PROPOFOL  N/A 12/16/2019   Procedure: COLONOSCOPY WITH PROPOFOL ;  Surgeon: Tami Falcon, MD;  Location: [facility];  Service: Endoscopy;  Laterality: N/A;   CORONARY PRESSURE/FFR STUDY N/A 07/25/2022   Procedure: INTRAVASCULAR PRESSURE WIRE/FFR STUDY;  Surgeon: Cody Das, MD;  [facility]: [facility];  Service: Cardiovascular;  Laterality: N/A;   ESOPHAGOGASTRODUODENOSCOPY     ESOPHAGOGASTRODUODENOSCOPY (EGD) WITH PROPOFOL  N/A 07/19/2023   Procedure: ESOPHAGOGASTRODUODENOSCOPY (EGD) WITH PROPOFOL ;  Surgeon: Hargis Lias, MD;  [facility]: [facility];  Service: Endoscopy;  Laterality: N/A;  12:45;asa 1-2, bumped to 1:00 for lunch to fit - messaged Tanya   FOOT SURGERY  1999   right ankle   HERNIA REPAIR  2006   x2   INCISIONAL HERNIA REPAIR  05/20/2012  Procedure: HERNIA REPAIR INCISIONAL;  Surgeon: Brandy Cal. Cornett, MD;  Location: [facility];  Service: General;  Laterality: N/A;   INCONTINENCE SURGERY  2010   sling done    KIDNEY STONE SURGERY  2001   KNEE ARTHROSCOPY  1992   left   left foot plating and scarping for arthritis  2011   LEFT HEART CATH AND CORONARY ANGIOGRAPHY N/A 07/25/2022   Procedure: LEFT HEART CATH AND CORONARY ANGIOGRAPHY;  Surgeon: Cody Das, MD;  [facility];  Service: Cardiovascular;  Laterality: N/A;   POLYPECTOMY  07/19/2023   Procedure: POLYPECTOMY;  Surgeon: Hargis Lias, MD;  [facility];  Service: Endoscopy;;   right ear tube insertion  2011   TEE WITHOUT CARDIOVERSION N/A 05/15/2013   Procedure:  TRANSESOPHAGEAL ECHOCARDIOGRAM (TEE);  Surgeon: Jessica Morn, MD;  Location: [facility] ENDOSCOPY;  Service: Cardiovascular;  Laterality: N/A;   thryoid biopsy     TONSILLECTOMY AND ADENOIDECTOMY  04/01/2014   TONSILLECTOMY AND ADENOIDECTOMY Bilateral 04/01/2014   Procedure: BILATERAL TONSILLECTOMY AND ADENOIDECTOMY;  Surgeon: Lawence Press, MD;  Location: [facility] OR;  Service: ENT;  Laterality: Bilateral;   TOTAL ABDOMINAL HYSTERECTOMY  2001   TOTAL KNEE ARTHROPLASTY Right 11/03/2015   TOTAL KNEE ARTHROPLASTY Right 11/03/2015   Procedure: RIGHT TOTAL KNEE ARTHROPLASTY/RIGHT;  Surgeon: Elly Habermann, MD;  [facility];  Service: Orthopedics;  Laterality: Right;   TOTAL KNEE ARTHROPLASTY Left 02/07/2016   Procedure: TOTAL KNEE ARTHROPLASTY;  Surgeon: Elly Habermann, MD;  Location: [facility] OR;  Service: Orthopedics;  Laterality: Left;   TUBAL LIGATION  1990    Allergies  Allergies  Allergen Reactions   Aquacel [Carboxymethylcellulose] Other (See Comments)    Blisters    Dextromethorphan Anaphylaxis   Nickel Rash    Turn read   Sulfonamide Derivatives Anaphylaxis, Hives and Swelling    REACTION: swelling, tongue and hives   Aspirin  Nausea And Vomiting    REACTION: severe gi upset.  Pt states she can tolerate 81 mg asa only.    Ciprofloxacin Itching and Swelling    REACTION: angioedema, urticaria   Cosyntropin Hives and Swelling    REACTION: swelling, hives   Adhesive [Tape] Other (See Comments)    Blisters skin   Amoxicillin  Nausea And Vomiting    REACTION: GI upset   Latex Rash    Blisters   Moxifloxacin Other (See Comments)    gi upset   Nsaids Other (See Comments)    Gi upset    History of Present Illness    [name] has a PMH of embolic stroke 6/14, PFO with no CVA recurrence.  Her PMH also includes psoriatic arthritis, rheumatoid arthritis,  nephrolithiasis, cerebrovascular malformations which are felt to be small, and cardioembolic CVA 6/24.Randomized to medical arm in the  "Reduce" secondary prevention of stroke trial with PFO. No recurrence of stroke since.   She also has OSA and is on CPAP.  She was diagnosed with COPD.  She has a 40+ year of smoking cigarettes and is followed by Dr. Rosa College.  She was seen in follow-up by Dr. Berry Bristol on 07/03/2022.  During that time she reported that over the prior month she noticed decreased exercise capacity and exertional chest pain.  She was taking nitroglycerin  which provided relief of her chest pain.  She reported pain from the left side of her chest to her left shoulder and increased dyspnea.  She denied PND and orthopnea.  She  denied lower extremity  swelling.  She had no new neurologic deficits.  She denied fever and chills.  She underwent LHC which showed normal coronary anatomy and no plaque.  She presents to the clinic today for follow-up evaluation and states***.  *** denies chest pain, shortness of breath, lower extremity edema, fatigue, palpitations, melena, hematuria, hemoptysis, diaphoresis, weakness, presyncope, syncope, orthopnea, and PND.  Palpitations-EKG today shows***. Heart healthy low-sodium diet Avoid triggers caffeine, chocolate, EtOH, dehydration etc.  Hyperlipidemia-LDL***. High-fiber diet Continue pravastatin , aspirin   Dyspnea on exertion-denies increased work of breathing today.  Denies exertional chest pain.  Underwent LHC which showed normal coronary arteries.  Details above.  Her DOE was felt to be related to COPD. Increase physical activity as tolerated Follows with pulmonology  OSA-reports compliance with CPAP.  Waking up well rested. Continue CPAP use.  Disposition: Follow-up with Dr. Berry Bristol in 12 months.  Home Medications    Prior to Admission medications   Medication Sig Start Date End Date Taking? Authorizing Provider  albuterol  (VENTOLIN  HFA) 108 (90 Base) MCG/ACT inhaler Inhale 1-2 puffs into the lungs every 6 (six) hours as needed for wheezing or shortness of breath.     [provider]  ALPRAZolam  (XANAX ) 0.5 MG tablet Take 0.5 mg by mouth at bedtime. 12/29/20   [provider]  amLODipine  (NORVASC ) 5 MG tablet TAKE 1 TABLET BY MOUTH DAILY 02/05/24   Knox Perl, MD  aspirin  EC 81 MG tablet Take 81 mg by mouth at bedtime.    [provider]  azelastine (OPTIVAR) 0.05 % ophthalmic solution 1 drop 2 (two) times daily.    [provider]  Calcium -Phosphorus-Vitamin D (CITRACAL +D3 PO) Take 1 tablet by mouth 2 (two) times daily.    [provider]  ipratropium (ATROVENT ) 0.03 % nasal spray Place 2 sprays into the nose in the morning and at bedtime. 05/18/21   [provider]  linaclotide  (LINZESS ) 145 MCG CAPS capsule Take 1 capsule (145 mcg total) by mouth daily. 10/23/23 04/20/24  Hargis Lias, MD  nitroGLYCERIN  (NITROSTAT ) 0.4 MG SL tablet Place 1 tablet (0.4 mg total) under the tongue every 5 (five) minutes as needed for chest pain. 03/13/23 11/26/23  Knox Perl, MD  OVER THE COUNTER MEDICATION Bio True eye drops daily.    [provider]  pantoprazole  (PROTONIX ) 40 MG tablet TAKE 1 TABLET BY MOUTH DAILY 02/06/24   Ahmed, Forbes Ida, MD  pravastatin  (PRAVACHOL ) 40 MG tablet Take 40 mg by mouth daily.    [provider]  PRESCRIPTION MEDICATION In Cholesterol study. Has injection once a month.has been in study for 3 years as of 12/07/23 and has 6 months to left in study. ( Will finish around end of August 2025 )    [provider]  topiramate  (TOPAMAX ) 100 MG tablet Take 100 mg by mouth 2 (two) times daily. 04/10/21   [provider]  triamcinolone cream (KENALOG) 0.1 % Apply 1 application topically 2 (two) times daily as needed (skin irritation.).  07/09/19   [provider]  ursodiol  (ACTIGALL ) 300 MG capsule Take 1 capsule (300 mg total) by mouth 3 (three) times daily. 12/07/23   Ahmed, Forbes Ida, MD    Family History    Family History  Problem Relation Age of Onset    Ovarian cancer Mother    Diabetes Maternal Grandmother    Arthritis Maternal Grandmother    Diabetes Brother    Diabetes Paternal Grandmother    Diabetes Maternal Grandfather    Diabetes  Paternal Grandfather    Heart disease Brother    Heart disease Other        Uncle   Breast cancer Maternal Aunt    She indicated that her mother is alive. She indicated that her father is alive. She indicated that her sister is alive. She indicated that only one of her two brothers is alive. She indicated that the status of her maternal grandmother is unknown. She indicated that the status of her maternal grandfather is unknown. She indicated that the status of her paternal grandmother is unknown. She indicated that the status of her paternal grandfather is unknown. She indicated that the status of her maternal aunt is unknown. She indicated that the status of her other is unknown.  Social History    Social History   Socioeconomic History   Marital status: Married    Spouse name: Myrtie Atkinson   Number of children: 3   Years of education: [employer] education level: Not on file  Occupational History   Occupation: Economist    Comment: Chiropodist     Employer: [employer]  Tobacco Use   Smoking status: Former    Current packs/day: 0.00    Average packs/day: 2.0 packs/day for 30.0 years (60.0 ttl pk-yrs)    Types: Cigarettes    Start date: 10/19/1969    Quit date: 10/20/1999    Years since quitting: 24.3   Smokeless tobacco: Never  Vaping Use   Vaping status: Never Used  Substance and Sexual Activity   Alcohol use: Yes    Comment: occ   Drug use: No   Sexual activity: Yes  Other Topics Concern   Not on file  Social History Narrative   Patient is married with 3 children.   Patient is right handed.   Patient has college education.   Caffeine Use: 1.5 cup of coffee in a.m   Social Drivers of Health   Financial Resource Strain: Medium Risk (05/18/2021)   Received from  [facility] visits prior to 12/16/2022., [facility] visits prior to 12/16/2022.   Overall Financial Resource Strain (CARDIA)    Difficulty of Paying Living Expenses: Somewhat hard  Food Insecurity: Low Risk  (06/08/2023)   [facility]   Hunger Vital Sign    Worried About Running Out of Food in the Last Year: Never true    Ran Out of Food in the Last Year: Never true  Transportation Needs: No Transportation Needs (06/08/2023)   Received from [employer]    In the past 12 months, has lack of reliable transportation kept you from medical appointments, meetings, work or from getting things needed for daily living? : No  Physical Activity: Inactive (05/18/2021)   [facility] from [facility] visits prior to 12/16/2022., [facility] visits prior to 12/16/2022.   Exercise Vital Sign    Days of Exercise per Week: 0 days    Minutes of Exercise per Session: 0 min  Stress: Stress Concern Present (05/18/2021)   Received from [facility] visits prior to 12/16/2022., [facility] visits prior to 12/16/2022.   [employer] of Occupational Health - Occupational Stress Questionnaire    Feeling of Stress : Very much  Social Connections: Socially Integrated (05/18/2021)   [facility] prior to 12/16/2022., [facility] visits prior to 12/16/2022.  Social Advertising account executive [NHANES]    Frequency of Communication with Friends and Family: More than three times a week    Frequency of Social Gatherings with Friends and Family: Once a week    Attends Religious Services: More than 4 times per year    Active Member of [employer] or Organizations: Yes    Attends Banker Meetings: More than 4 times per year    Marital Status: Married  Catering manager Violence: Not At Risk (05/18/2021)    [facility] visits prior to 12/16/2022., [facility] visits prior to 12/16/2022.   Humiliation, Afraid, Rape, and Kick questionnaire    Fear of Current or Ex-Partner: No    Emotionally Abused: No    Physically Abused: No    Sexually Abused: No     Review of Systems    General:  No chills, fever, night sweats or weight changes.  Cardiovascular:  No chest pain, dyspnea on exertion, edema, orthopnea, palpitations, paroxysmal nocturnal dyspnea. Dermatological: No rash, lesions/masses Respiratory: No cough, dyspnea Urologic: No hematuria, dysuria Abdominal:   No nausea, vomiting, diarrhea, bright red blood per rectum, melena, or hematemesis Neurologic:  No visual changes, wkns, changes in mental status. All other systems reviewed and are otherwise negative except as noted above.  Physical Exam    VS:  There were no vitals taken for this visit. , BMI There is no height or weight on file to calculate BMI. GEN: Well nourished, well developed, in no acute distress. HEENT: normal. Neck: Supple, no JVD, carotid bruits, or masses. Cardiac: RRR, no murmurs, rubs, or gallops. No clubbing, cyanosis, edema.  Radials/DP/PT 2+ and equal bilaterally.  Respiratory:  Respirations regular and unlabored, clear to auscultation bilaterally. GI: Soft, nontender, nondistended, BS + x 4. MS: no deformity or atrophy. Skin: warm and dry, no rash. Neuro:  Strength and sensation are intact. Psych: Normal affect.  Accessory Clinical Findings    Recent Labs: 11/06/2023: ALT 42; BUN 10; Creatinine, Ser 0.60; Hemoglobin 15.2; Platelets 205; Potassium 4.6; Sodium 136   Recent Lipid Panel    Component Value Date/Time   CHOL 139 03/27/2013 0540   TRIG 135 03/27/2013 0540   HDL 23 (L) 03/27/2013 0540   CHOLHDL 6.0 03/27/2013 0540   VLDL 27 03/27/2013 0540   LDLCALC 89 03/27/2013 0540    No BP recorded.  {Refresh Note OR Click here to enter BP  :1}***     ECG personally reviewed by me today- ***    Transesophageal echocardiogram 05/20/2013 Study Conclusions   - Left ventricle: Systolic function was normal. Wall motion    was normal; there were no regional wall motion    abnormalities.  - Left atrium: No evidence of thrombus in the atrial cavity    or appendage.  - Right atrium: No evidence of thrombus in the atrial cavity    or appendage.  - Atrial septum: There was a patent foramen ovale. Agitated    saline contrast study showed a large right-to-left atrial    level shunt, in the baseline state. Agitated saline    contrast study showed a large right-to-left atrial level    shunt, following an increase in RA pressure induced by    provocative maneuvers. The PFO tunnel length was 1.5cm.    About 25-30 bubbles noted with shunting crossing the PFO.  Transesophageal echocardiography.  2D and color Doppler.  Blood pressure:     118/37.  Patient status:  Outpatient.  Location:  Endoscopy.    LHC 07/25/2022   No angiographic evidence of coronary artery disease   Given resting chest pain, risk factors (female patient, smoker) for CMD, the following indices were performed   Resting Pd/Pa: 0.96 FFR 0.93 IMR 22 CFR 3.7   Consider non-cardiac cause of chest pain   Patient has had constant left sided facial numbness for last few days, present before and after the procedure. I do not think this is new finding.  No new focal neurodeficit. No suspicion of acute stroke.        Cody Das, MD Pager: [phone] Office: [phone]  Diagnostic Dominance: Right  Intervention       Assessment & Plan   1.  ***   Chet Cota. Dillyn Joaquin NP-C     02/20/2024, 12:37 PM [facility] [address] Office [phone] Fax [phone]    I spent***minutes examining this patient, reviewing medications, and using patient centered shared decision making involving their cardiac care.    I spent  20 minutes reviewing past medical history,  medications, and prior cardiac tests.

## 2024-02-22 ENCOUNTER — Other Ambulatory Visit: Payer: Self-pay

## 2024-02-22 ENCOUNTER — Ambulatory Visit: Admitting: General Practice

## 2024-02-22 DIAGNOSIS — I10 Essential (primary) hypertension: Secondary | ICD-10-CM

## 2024-02-22 MED ORDER — AMLODIPINE BESYLATE 5 MG PO TABS: 5.0000 mg | ORAL_TABLET | Freq: Every day | ORAL | 0 refills | Status: DC

## 2024-02-26 ENCOUNTER — Other Ambulatory Visit: Payer: Self-pay

## 2024-02-26 ENCOUNTER — Emergency Department (HOSPITAL_COMMUNITY)

## 2024-02-26 ENCOUNTER — Inpatient Hospital Stay (HOSPITAL_COMMUNITY)
Admission: EM | Admit: 2024-02-26 | Discharge: 2024-02-29 | DRG: 395 | Disposition: A | Discharge status: Home / Self Care | Attending: Internal Medicine | Admitting: Internal Medicine

## 2024-02-26 ENCOUNTER — Encounter (HOSPITAL_COMMUNITY): Payer: Self-pay

## 2024-02-26 DIAGNOSIS — J4489 Other specified chronic obstructive pulmonary disease: Secondary | ICD-10-CM | POA: Diagnosis present

## 2024-02-26 DIAGNOSIS — K581 Irritable bowel syndrome with constipation: Secondary | ICD-10-CM | POA: Diagnosis present

## 2024-02-26 DIAGNOSIS — G4700 Insomnia, unspecified: Secondary | ICD-10-CM | POA: Diagnosis present

## 2024-02-26 DIAGNOSIS — K559 Vascular disorder of intestine, unspecified: Secondary | ICD-10-CM | POA: Diagnosis present

## 2024-02-26 DIAGNOSIS — R569 Unspecified convulsions: Secondary | ICD-10-CM

## 2024-02-26 DIAGNOSIS — M797 Fibromyalgia: Secondary | ICD-10-CM | POA: Diagnosis not present

## 2024-02-26 DIAGNOSIS — Z881 Allergy status to other antibiotic agents status: Secondary | ICD-10-CM

## 2024-02-26 DIAGNOSIS — R1310 Dysphagia, unspecified: Secondary | ICD-10-CM | POA: Diagnosis present

## 2024-02-26 DIAGNOSIS — K529 Noninfective gastroenteritis and colitis, unspecified: Secondary | ICD-10-CM | POA: Diagnosis not present

## 2024-02-26 DIAGNOSIS — G2581 Restless legs syndrome: Secondary | ICD-10-CM | POA: Diagnosis present

## 2024-02-26 DIAGNOSIS — I639 Cerebral infarction, unspecified: Secondary | ICD-10-CM | POA: Diagnosis present

## 2024-02-26 DIAGNOSIS — Z96653 Presence of artificial knee joint, bilateral: Secondary | ICD-10-CM | POA: Diagnosis present

## 2024-02-26 DIAGNOSIS — K2970 Gastritis, unspecified, without bleeding: Secondary | ICD-10-CM | POA: Diagnosis present

## 2024-02-26 DIAGNOSIS — Z91048 Other nonmedicinal substance allergy status: Secondary | ICD-10-CM

## 2024-02-26 DIAGNOSIS — J309 Allergic rhinitis, unspecified: Secondary | ICD-10-CM | POA: Diagnosis present

## 2024-02-26 DIAGNOSIS — Z7982 Long term (current) use of aspirin: Secondary | ICD-10-CM

## 2024-02-26 DIAGNOSIS — K5731 Diverticulosis of large intestine without perforation or abscess with bleeding: Secondary | ICD-10-CM | POA: Diagnosis present

## 2024-02-26 DIAGNOSIS — E785 Hyperlipidemia, unspecified: Secondary | ICD-10-CM | POA: Diagnosis present

## 2024-02-26 DIAGNOSIS — I10 Essential (primary) hypertension: Secondary | ICD-10-CM | POA: Diagnosis present

## 2024-02-26 DIAGNOSIS — G40909 Epilepsy, unspecified, not intractable, without status epilepticus: Secondary | ICD-10-CM | POA: Diagnosis present

## 2024-02-26 DIAGNOSIS — G4730 Sleep apnea, unspecified: Secondary | ICD-10-CM | POA: Diagnosis present

## 2024-02-26 DIAGNOSIS — K5904 Chronic idiopathic constipation: Secondary | ICD-10-CM | POA: Diagnosis present

## 2024-02-26 DIAGNOSIS — K5730 Diverticulosis of large intestine without perforation or abscess without bleeding: Secondary | ICD-10-CM | POA: Diagnosis present

## 2024-02-26 DIAGNOSIS — Z9049 Acquired absence of other specified parts of digestive tract: Secondary | ICD-10-CM

## 2024-02-26 DIAGNOSIS — Z833 Family history of diabetes mellitus: Secondary | ICD-10-CM

## 2024-02-26 DIAGNOSIS — K625 Hemorrhage of anus and rectum: Secondary | ICD-10-CM | POA: Diagnosis not present

## 2024-02-26 DIAGNOSIS — K743 Primary biliary cirrhosis: Secondary | ICD-10-CM | POA: Diagnosis present

## 2024-02-26 DIAGNOSIS — E876 Hypokalemia: Secondary | ICD-10-CM | POA: Diagnosis present

## 2024-02-26 DIAGNOSIS — M05742 Rheumatoid arthritis with rheumatoid factor of left hand without organ or systems involvement: Secondary | ICD-10-CM | POA: Diagnosis not present

## 2024-02-26 DIAGNOSIS — F32A Depression, unspecified: Secondary | ICD-10-CM | POA: Diagnosis present

## 2024-02-26 DIAGNOSIS — M05741 Rheumatoid arthritis with rheumatoid factor of right hand without organ or systems involvement: Secondary | ICD-10-CM

## 2024-02-26 DIAGNOSIS — M170 Bilateral primary osteoarthritis of knee: Secondary | ICD-10-CM | POA: Diagnosis present

## 2024-02-26 DIAGNOSIS — F410 Panic disorder [episodic paroxysmal anxiety] without agoraphobia: Secondary | ICD-10-CM | POA: Diagnosis present

## 2024-02-26 DIAGNOSIS — Z8619 Personal history of other infectious and parasitic diseases: Secondary | ICD-10-CM

## 2024-02-26 DIAGNOSIS — K589 Irritable bowel syndrome without diarrhea: Secondary | ICD-10-CM | POA: Diagnosis present

## 2024-02-26 DIAGNOSIS — Z8249 Family history of ischemic heart disease and other diseases of the circulatory system: Secondary | ICD-10-CM

## 2024-02-26 DIAGNOSIS — Z79899 Other long term (current) drug therapy: Secondary | ICD-10-CM

## 2024-02-26 DIAGNOSIS — Z90710 Acquired absence of both cervix and uterus: Secondary | ICD-10-CM

## 2024-02-26 DIAGNOSIS — Z8673 Personal history of transient ischemic attack (TIA), and cerebral infarction without residual deficits: Secondary | ICD-10-CM

## 2024-02-26 DIAGNOSIS — Z888 Allergy status to other drugs, medicaments and biological substances status: Secondary | ICD-10-CM

## 2024-02-26 DIAGNOSIS — K580 Irritable bowel syndrome with diarrhea: Secondary | ICD-10-CM | POA: Diagnosis present

## 2024-02-26 DIAGNOSIS — K760 Fatty (change of) liver, not elsewhere classified: Secondary | ICD-10-CM | POA: Diagnosis present

## 2024-02-26 DIAGNOSIS — Z87891 Personal history of nicotine dependence: Secondary | ICD-10-CM

## 2024-02-26 DIAGNOSIS — M069 Rheumatoid arthritis, unspecified: Secondary | ICD-10-CM | POA: Diagnosis present

## 2024-02-26 DIAGNOSIS — Z9851 Tubal ligation status: Secondary | ICD-10-CM

## 2024-02-26 DIAGNOSIS — R1013 Epigastric pain: Secondary | ICD-10-CM | POA: Diagnosis present

## 2024-02-26 DIAGNOSIS — K219 Gastro-esophageal reflux disease without esophagitis: Secondary | ICD-10-CM | POA: Diagnosis present

## 2024-02-26 DIAGNOSIS — Z87442 Personal history of urinary calculi: Secondary | ICD-10-CM

## 2024-02-26 DIAGNOSIS — Z882 Allergy status to sulfonamides status: Secondary | ICD-10-CM

## 2024-02-26 DIAGNOSIS — Z880 Allergy status to penicillin: Secondary | ICD-10-CM

## 2024-02-26 DIAGNOSIS — E739 Lactose intolerance, unspecified: Secondary | ICD-10-CM | POA: Diagnosis present

## 2024-02-26 DIAGNOSIS — G4733 Obstructive sleep apnea (adult) (pediatric): Secondary | ICD-10-CM | POA: Diagnosis present

## 2024-02-26 DIAGNOSIS — R519 Headache, unspecified: Secondary | ICD-10-CM | POA: Diagnosis present

## 2024-02-26 DIAGNOSIS — Z886 Allergy status to analgesic agent status: Secondary | ICD-10-CM

## 2024-02-26 DIAGNOSIS — K2990 Gastroduodenitis, unspecified, without bleeding: Secondary | ICD-10-CM | POA: Diagnosis present

## 2024-02-26 DIAGNOSIS — Z91040 Latex allergy status: Secondary | ICD-10-CM

## 2024-02-26 LAB — COMPREHENSIVE METABOLIC PANEL WITH GFR
ALT: 37 U/L (ref 0–44)
AST: 26 U/L (ref 15–41)
Albumin: 4.3 g/dL (ref 3.5–5.0)
Alkaline Phosphatase: 70 U/L (ref 38–126)
Anion gap: 11 (ref 5–15)
BUN: 6 mg/dL — ABNORMAL LOW (ref 8–23)
CO2: 16 mmol/L — ABNORMAL LOW (ref 22–32)
Calcium: 9.4 mg/dL (ref 8.9–10.3)
Chloride: 113 mmol/L — ABNORMAL HIGH (ref 98–111)
Creatinine, Ser: 0.58 mg/dL (ref 0.44–1.00)
GFR, Estimated: >60 mL/min (ref >60)
Glucose, Bld: 112 mg/dL — ABNORMAL HIGH (ref 70–99)
Potassium: 3.2 mmol/L — ABNORMAL LOW (ref 3.5–5.1)
Sodium: 140 mmol/L (ref 135–145)
Total Bilirubin: 0.8 mg/dL (ref 0.0–1.2)
Total Protein: 7.1 g/dL (ref 6.5–8.1)

## 2024-02-26 LAB — CBC WITH DIFFERENTIAL/PLATELET
Abs Immature Granulocytes: 0.03 10*3/uL (ref 0.00–0.07)
Basophils Absolute: 0.1 10*3/uL (ref 0.0–0.1)
Basophils Relative: 1 %
Eosinophils Absolute: 0.1 10*3/uL (ref 0.0–0.5)
Eosinophils Relative: 1 %
HCT: 44.4 % (ref 36.0–46.0)
Hemoglobin: 15.9 g/dL — ABNORMAL HIGH (ref 12.0–15.0)
Immature Granulocytes: 0 %
Lymphocytes Relative: 21 %
Lymphs Abs: 2.1 10*3/uL (ref 0.7–4.0)
MCH: 32.3 pg (ref 26.0–34.0)
MCHC: 35.8 g/dL (ref 30.0–36.0)
MCV: 90.2 fL (ref 80.0–100.0)
Monocytes Absolute: 0.8 10*3/uL (ref 0.1–1.0)
Monocytes Relative: 8 %
Neutro Abs: 7.1 10*3/uL (ref 1.7–7.7)
Neutrophils Relative %: 69 %
Platelets: 244 10*3/uL (ref 150–400)
RBC: 4.92 MIL/uL (ref 3.87–5.11)
RDW: 12.7 % (ref 11.5–15.5)
WBC: 10.3 10*3/uL (ref 4.0–10.5)
nRBC: 0 % (ref 0.0–0.2)

## 2024-02-26 LAB — LACTIC ACID, PLASMA
Lactic Acid, Venous: 1.3 mmol/L (ref 0.5–1.9)
Lactic Acid, Venous: 1.1 mmol/L (ref 0.5–1.9)

## 2024-02-26 LAB — TYPE AND SCREEN
ABO/RH(D): O NEG
Antibody Screen: NEGATIVE

## 2024-02-26 LAB — MAGNESIUM: Magnesium: 2.2 mg/dL (ref 1.7–2.4)

## 2024-02-26 MED ORDER — ALPRAZOLAM 0.5 MG PO TABS
0.5000 mg | ORAL_TABLET | Freq: Every day | ORAL | Status: DC
  Administered 2024-02-26 – 2024-02-28 (×3): 0.5 mg via ORAL
  Filled 2024-02-26 (×3): qty 1

## 2024-02-26 MED ORDER — TOPIRAMATE 100 MG PO TABS
100.0000 mg | ORAL_TABLET | Freq: Every day | ORAL | Status: DC
  Administered 2024-02-26 – 2024-02-28 (×3): 100 mg via ORAL
  Filled 2024-02-26 (×3): qty 1

## 2024-02-26 MED ORDER — KETOTIFEN FUMARATE 0.035 % OP SOLN
1.0000 [drp] | Freq: Two times a day (BID) | OPHTHALMIC | Status: DC
  Administered 2024-02-26 – 2024-02-29 (×6): 1 [drp] via OPHTHALMIC
  Filled 2024-02-26: qty 5

## 2024-02-26 MED ORDER — IPRATROPIUM BROMIDE 0.06 % NA SOLN
2.0000 | Freq: Two times a day (BID) | NASAL | Status: DC
  Administered 2024-02-26 – 2024-02-29 (×6): 2 via NASAL
  Filled 2024-02-26: qty 15

## 2024-02-26 MED ORDER — ACETAMINOPHEN 650 MG RE SUPP: 650.0000 mg | Freq: Four times a day (QID) | RECTAL | Status: DC | PRN

## 2024-02-26 MED ORDER — LACTATED RINGERS IV SOLN: INTRAVENOUS | Status: AC

## 2024-02-26 MED ORDER — HYDRALAZINE HCL 20 MG/ML IJ SOLN
10.0000 mg | INTRAMUSCULAR | Status: DC | PRN
Start: 2024-02-26 — End: 2024-02-29

## 2024-02-26 MED ORDER — FENTANYL CITRATE PF 50 MCG/ML IJ SOSY
12.5000 ug | PREFILLED_SYRINGE | INTRAMUSCULAR | Status: DC | PRN
  Administered 2024-02-26 – 2024-02-28 (×7): 12.5 ug via INTRAVENOUS
  Filled 2024-02-26 (×7): qty 1

## 2024-02-26 MED ORDER — PHENTERMINE HCL 15 MG PO CAPS
15.0000 mg | ORAL_CAPSULE | Freq: Every day | ORAL | Status: DC
Start: 2024-02-27 — End: 2024-02-26

## 2024-02-26 MED ORDER — IPRATROPIUM BROMIDE 0.03 % NA SOLN
2.0000 | Freq: Two times a day (BID) | NASAL | Status: DC
  Filled 2024-02-26: qty 30

## 2024-02-26 MED ORDER — PROCHLORPERAZINE EDISYLATE 10 MG/2ML IJ SOLN
10.0000 mg | INTRAMUSCULAR | Status: DC | PRN
Start: 2024-02-26 — End: 2024-02-29
  Administered 2024-02-28: 10 mg via INTRAVENOUS
  Filled 2024-02-26: qty 2

## 2024-02-26 MED ORDER — ASPIRIN 81 MG PO TBEC
81.0000 mg | DELAYED_RELEASE_TABLET | Freq: Every day | ORAL | Status: DC
  Administered 2024-02-26 – 2024-02-28 (×3): 81 mg via ORAL
  Filled 2024-02-26 (×3): qty 1

## 2024-02-26 MED ORDER — LINACLOTIDE 145 MCG PO CAPS
145.0000 ug | ORAL_CAPSULE | Freq: Every day | ORAL | Status: DC
  Administered 2024-02-27 – 2024-02-29 (×3): 145 ug via ORAL
  Filled 2024-02-26 (×3): qty 1

## 2024-02-26 MED ORDER — SODIUM CHLORIDE 0.9 % IV BOLUS
1000.0000 mL | Freq: Once | INTRAVENOUS | Status: AC
  Administered 2024-02-26: 1000 mL via INTRAVENOUS

## 2024-02-26 MED ORDER — PANTOPRAZOLE SODIUM 40 MG PO TBEC
40.0000 mg | DELAYED_RELEASE_TABLET | Freq: Every evening | ORAL | Status: DC
  Administered 2024-02-26 – 2024-02-28 (×3): 40 mg via ORAL
  Filled 2024-02-26 (×3): qty 1

## 2024-02-26 MED ORDER — URSODIOL 300 MG PO CAPS
300.0000 mg | ORAL_CAPSULE | ORAL | Status: DC
  Administered 2024-02-27 – 2024-02-29 (×2): 300 mg via ORAL
  Filled 2024-02-26 (×3): qty 1

## 2024-02-26 MED ORDER — TRAZODONE HCL 50 MG PO TABS
50.0000 mg | ORAL_TABLET | Freq: Every evening | ORAL | Status: DC | PRN
  Filled 2024-02-26: qty 1

## 2024-02-26 MED ORDER — POTASSIUM CHLORIDE 10 MEQ/100ML IV SOLN
10.0000 meq | INTRAVENOUS | Status: AC
Start: 2024-02-26 — End: 2024-02-28
  Administered 2024-02-26 (×4): 10 meq via INTRAVENOUS
  Filled 2024-02-26 (×4): qty 100

## 2024-02-26 MED ORDER — PANTOPRAZOLE SODIUM 40 MG IV SOLR
40.0000 mg | Freq: Once | INTRAVENOUS | Status: AC
  Administered 2024-02-26: 40 mg via INTRAVENOUS
  Filled 2024-02-26: qty 10

## 2024-02-26 MED ORDER — OXYCODONE HCL 5 MG PO TABS
5.0000 mg | ORAL_TABLET | Freq: Four times a day (QID) | ORAL | Status: DC | PRN
  Administered 2024-02-26 (×2): 5 mg via ORAL
  Filled 2024-02-26 (×4): qty 1

## 2024-02-26 MED ORDER — PANTOPRAZOLE SODIUM 40 MG PO TBEC: 40.0000 mg | DELAYED_RELEASE_TABLET | Freq: Every day | ORAL | Status: DC

## 2024-02-26 MED ORDER — SODIUM CHLORIDE 0.9 % IV SOLN
1.0000 g | Freq: Three times a day (TID) | INTRAVENOUS | Status: DC
  Administered 2024-02-26 – 2024-02-29 (×9): 1 g via INTRAVENOUS
  Filled 2024-02-26 (×9): qty 20

## 2024-02-26 MED ORDER — TOPIRAMATE 25 MG PO TABS
50.0000 mg | ORAL_TABLET | Freq: Every day | ORAL | Status: DC
  Administered 2024-02-27 – 2024-02-29 (×3): 50 mg via ORAL
  Filled 2024-02-26 (×3): qty 2

## 2024-02-26 MED ORDER — AMLODIPINE BESYLATE 5 MG PO TABS
5.0000 mg | ORAL_TABLET | Freq: Every day | ORAL | Status: DC
  Administered 2024-02-26 – 2024-02-28 (×3): 5 mg via ORAL
  Filled 2024-02-26 (×3): qty 1

## 2024-02-26 MED ORDER — ACETAMINOPHEN 325 MG PO TABS: 650.0000 mg | ORAL_TABLET | Freq: Four times a day (QID) | ORAL | Status: DC | PRN

## 2024-02-26 MED ORDER — SACCHAROMYCES BOULARDII 250 MG PO CAPS
250.0000 mg | ORAL_CAPSULE | Freq: Two times a day (BID) | ORAL | Status: DC
  Administered 2024-02-26 – 2024-02-29 (×6): 250 mg via ORAL
  Filled 2024-02-26 (×6): qty 1

## 2024-02-26 MED ORDER — PRAVASTATIN SODIUM 40 MG PO TABS
40.0000 mg | ORAL_TABLET | Freq: Every day | ORAL | Status: DC
  Administered 2024-02-26 – 2024-02-29 (×4): 40 mg via ORAL
  Filled 2024-02-26 (×4): qty 1

## 2024-02-26 MED ORDER — MORPHINE SULFATE (PF) 4 MG/ML IV SOLN
4.0000 mg | Freq: Once | INTRAVENOUS | Status: AC
  Administered 2024-02-26: 4 mg via INTRAVENOUS
  Filled 2024-02-26: qty 1

## 2024-02-26 MED ORDER — ONDANSETRON HCL 4 MG/2ML IJ SOLN
4.0000 mg | Freq: Once | INTRAMUSCULAR | Status: AC
  Administered 2024-02-26: 4 mg via INTRAVENOUS
  Filled 2024-02-26: qty 2

## 2024-02-26 MED ORDER — IOHEXOL 300 MG/ML  SOLN
100.0000 mL | Freq: Once | INTRAMUSCULAR | Status: AC | PRN
  Administered 2024-02-26: 100 mL via INTRAVENOUS

## 2024-02-26 NOTE — ED Notes (Signed)
 Pt ambulated to br, pt called this nt in there. Bright red blood was in the toilet. Pt co of abd pain.

## 2024-02-26 NOTE — ED Notes (Signed)
 Istat performed and pt's creatinine is 0.6, BUN 6; Dr. Randal Bury notified

## 2024-02-26 NOTE — Progress Notes (Signed)
   02/26/24 2152  TOC Brief Assessment  Insurance and Status Reviewed  Patient has primary care physician Yes  Home environment has been reviewed From home  Prior level of function: Independent  Prior/Current Home Services No current home services  Social Drivers of Health Review SDOH reviewed no interventions necessary  Readmission risk has been reviewed Yes  Transition of care needs no transition of care needs at this time   Transition of Care Department [facility]) has reviewed patient and no other TOC needs have been identified at this time. We will continue to monitor patient advancement through interdisciplinary progression rounds. If new patient needs arise, please place a TOC consult.

## 2024-02-26 NOTE — Plan of Care (Signed)

## 2024-02-26 NOTE — Hospital Course (Signed)
 63 year old female with history of COPD, history of CVA, rheumatoid arthritis, diverticulosis, epilepsy, fibromyalgia, IBS with chronic idiopathic constipation, dyspepsia, dysphagia, history of gastritis and gastroduodenitis, chronic insomnia, metabolic dysfunction-associated steatotic liver disease (MASLD), OSA, RLS, Primary biliary cirrhosis presented to the emergency department complaining of lower abdominal pain, back pain and reported having rectal bleeding that started yesterday.  She reported that she had not been feeling well most of the day yesterday.  She says that she was not able to eat much food other than having some fruit for supper due to overall malaise.  She reported that she started having bright red blood in her stool with some blood clots yesterday evening.  She also had intermittent bouts of severe cramping abdominal pain.  She was having a lot of flatus as well.  [place] and [place].  No other family members have been ill.  She ended up going to the urgent care today and they advised her to come to the emergency department for CT scanning.  She denies having fever and chills.  She was afebrile with a temperature of 97.5, pulse 68, blood pressure 146/98.  She was 100% on room air oxygen.  Her sodium was 140, potassium 3.2, BUN 6, creatinine 0.58.  Her lactic acid was 1.3.  Her WBC was 10.3 and hemoglobin 15.9.  Her CT scan showed findings of mild colitis and sigmoid diverticulosis.  GI was consulted and Dr. Alita Irwin suggested that this could be a diverticular bleed versus ischemic colitis and recommended IV antibiotics and stool studies.  Patient was started on IV meropenem after consultation with Pharm.D. given multiple reported allergies to antibiotics and medications.  Patient is being admitted to the hospital for further management and supportive measures.

## 2024-02-26 NOTE — ED Triage Notes (Signed)
 Pt arrived via POV c/o lower abdominal pain, back pain and reports having rectal bleeding since yesterday. Pt reports recent constipation and now she is seeing blood and clots. Pt reports going to UC earlier today and being advised to come to the ER for CT Scan.

## 2024-02-26 NOTE — ED Provider Notes (Signed)
  EMERGENCY DEPARTMENT AT [facility] Provider Note   CSN: [id number] Arrival date & time: 02/26/24  1103     History  Chief Complaint  Patient presents with   Rectal Bleeding    [name] is a 63 y.o. female.  She is here with severe lower abdominal pain that started around 3:00 this morning associated with loose bloody stools.  She said she had been constipated for a few days.  Passing some stool and then followed by blood and clots.  Both bright and dark red.  No prior history of GI bleeding.  The history is provided by the patient and the spouse.  Rectal Bleeding Quality:  Bright red and maroon Amount:  Moderate Duration:  9 hours Timing:  Intermittent Chronicity:  New Similar prior episodes: no   Relieved by:  None tried Worsened by:  Nothing Ineffective treatments:  None tried Associated symptoms: abdominal pain   Associated symptoms: no fever, no hematemesis, no loss of consciousness and no vomiting   Risk factors: no anticoagulant use        Home Medications Prior to Admission medications   Medication Sig Start Date End Date Taking? Authorizing Provider  albuterol  (VENTOLIN  HFA) 108 (90 Base) MCG/ACT inhaler Inhale 1-2 puffs into the lungs every 6 (six) hours as needed for wheezing or shortness of breath.    [provider]  ALPRAZolam  (XANAX ) 0.5 MG tablet Take 0.5 mg by mouth at bedtime. 12/29/20   [provider]  amLODipine  (NORVASC ) 5 MG tablet Take 1 tablet (5 mg total) by mouth daily. 02/22/24   Carie Charity, NP  aspirin  EC 81 MG tablet Take 81 mg by mouth at bedtime.    [provider]  azelastine (OPTIVAR) 0.05 % ophthalmic solution 1 drop 2 (two) times daily.    [provider]  Calcium -Phosphorus-Vitamin D (CITRACAL +D3 PO) Take 1 tablet by mouth 2 (two) times daily.    [provider]  ipratropium (ATROVENT) 0.03 % nasal spray Place 2 sprays into the nose in the morning and at  bedtime. 05/18/21   [provider]  linaclotide  (LINZESS ) 145 MCG CAPS capsule Take 1 capsule (145 mcg total) by mouth daily. 10/23/23 04/20/24  Hargis Lias, MD  nitroGLYCERIN  (NITROSTAT ) 0.4 MG SL tablet Place 1 tablet (0.4 mg total) under the tongue every 5 (five) minutes as needed for chest pain. 03/13/23 11/26/23  Knox Perl, MD  OVER THE COUNTER MEDICATION Bio True eye drops daily.    [provider]  pantoprazole  (PROTONIX ) 40 MG tablet TAKE 1 TABLET BY MOUTH DAILY 02/06/24   Ahmed, Forbes Ida, MD  pravastatin  (PRAVACHOL ) 40 MG tablet Take 40 mg by mouth daily.    [provider]  PRESCRIPTION MEDICATION In Cholesterol study. Has injection once a month.has been in study for 3 years as of 12/07/23 and has 6 months to left in study. ( Will finish around end of August 2025 )    [provider]  topiramate  (TOPAMAX ) 100 MG tablet Take 100 mg by mouth 2 (two) times daily. 04/10/21   [provider]  triamcinolone cream (KENALOG) 0.1 % Apply 1 application topically 2 (two) times daily as needed (skin irritation.).  07/09/19   [provider]  ursodiol  (ACTIGALL ) 300 MG capsule Take 1 capsule (300 mg total) by mouth 3 (three) times daily. 12/07/23   Hargis Lias, MD      Allergies    Aquacel [carboxymethylcellulose], Dextromethorphan, Nickel, Sulfonamide  derivatives, Aspirin , Ciprofloxacin, Cosyntropin, Adhesive [tape], Amoxicillin, Latex, Moxifloxacin, and Nsaids    Review of Systems   Review of Systems  Constitutional:  Positive for diaphoresis. Negative for fever.  Respiratory:  Negative for cough.   Cardiovascular:  Negative for chest pain.  Gastrointestinal:  Positive for abdominal pain and hematochezia. Negative for hematemesis and vomiting.  Genitourinary:  Negative for dysuria.  Musculoskeletal:  Positive for back pain.  Neurological:  Negative for loss of consciousness.    Physical Exam Updated Vital Signs BP (!) 146/98 (BP  Location: Right Arm)   Pulse 68   Temp (!) 97.5 F (36.4 C) (Temporal)   Resp 18   Ht 5\' 5"  (1.651 m)   Wt 85.4 kg   SpO2 100%   BMI 31.33 kg/m  Physical Exam Vitals and nursing note reviewed.  Constitutional:      General: She is not in acute distress.    Appearance: Normal appearance. She is well-developed.  HENT:     Head: Normocephalic and atraumatic.  Eyes:     Conjunctiva/sclera: Conjunctivae normal.  Cardiovascular:     Rate and Rhythm: Normal rate and regular rhythm.     Heart sounds: No murmur heard. Pulmonary:     Effort: Pulmonary effort is normal. No respiratory distress.     Breath sounds: Normal breath sounds. No stridor. No wheezing.  Abdominal:     Palpations: Abdomen is soft.     Tenderness: There is abdominal tenderness (general). There is no guarding or rebound.  Musculoskeletal:        General: No tenderness.     Cervical back: Neck supple.  Skin:    General: Skin is warm and dry.  Neurological:     General: No focal deficit present.     Mental Status: She is alert.     GCS: GCS eye subscore is 4. GCS verbal subscore is 5. GCS motor subscore is 6.     ED Results / Procedures / Treatments   Labs (all labs ordered are listed, but only abnormal results are displayed) Labs Reviewed  CBC WITH DIFFERENTIAL/PLATELET - Abnormal; Notable for the following components:      Result Value   Hemoglobin 15.9 (*)    All other components within normal limits  COMPREHENSIVE METABOLIC PANEL WITH GFR - Abnormal; Notable for the following components:   Potassium 3.2 (*)    Chloride 113 (*)    CO2 16 (*)    Glucose, Bld 112 (*)    BUN 6 (*)    All other components within normal limits  GASTROINTESTINAL PANEL BY PCR, STOOL (REPLACES STOOL CULTURE)  LACTIC ACID, PLASMA  LACTIC ACID, PLASMA  I-STAT CHEM 8, ED  TYPE AND SCREEN    EKG None EKG did not cross in epic.  Sinus rhythm rate of 52 general T wave flattening QTc prolongation but probably close to normal  by my reading  Radiology CT ABDOMEN PELVIS W CONTRAST Result Date: 02/26/2024 CLINICAL DATA:  Acute non localized abdominal pain. EXAM: CT ABDOMEN AND PELVIS WITH CONTRAST TECHNIQUE: Multidetector CT imaging of the abdomen and pelvis was performed using the standard protocol following bolus administration of intravenous contrast. RADIATION DOSE REDUCTION: This exam was performed according to the departmental dose-optimization program which includes automated exposure control, adjustment of the mA and/or kV according to patient size and/or use of iterative reconstruction technique. CONTRAST:  OMNIPAQUE  IOHEXOL  300 MG/ML  SOLN COMPARISON:  12/06/2023 FINDINGS: Lower Chest: No acute findings. Hepatobiliary: No suspicious hepatic  masses identified. Prior cholecystectomy. No evidence of biliary obstruction. Pancreas:  No mass or inflammatory changes. Spleen: Within normal limits in size and appearance. Adrenals/Urinary Tract: No suspicious masses identified. Several 2-3 mm nonobstructing calculi are again seen in the lower pole of the left kidney. No evidence of ureteral calculi or hydronephrosis. Unremarkable unopacified urinary bladder. Stomach/Bowel: Mild wall thickening is seen involving the splenic flexure and descending colon, consistent with colitis. Sigmoid diverticulosis is noted, without signs of diverticulitis in this region. No evidence of bowel obstruction or abnormal fluid collections. Vascular/Lymphatic: No pathologically enlarged lymph nodes. No acute vascular findings. Reproductive: Prior hysterectomy noted. Adnexal regions are unremarkable in appearance. Other: Surgical mesh again seen within the lower anterior abdominal wall, without evidence of recurrent hernia. Musculoskeletal:  No suspicious bone lesions identified. IMPRESSION: Mild colitis involving the splenic flexure and descending colon. Sigmoid diverticulosis, without radiographic evidence of diverticulitis. Tiny nonobstructing left  renal calculi. No evidence of ureteral calculi or hydronephrosis. Electronically Signed   By: Marlyce Sine M.D.   On: 02/26/2024 13:15    Procedures Procedures    Medications Ordered in ED Medications  meropenem (MERREM) 1 g in sodium chloride  0.9 % 100 mL IVPB (1 g Intravenous New Bag/Given 02/26/24 1412)  lactated ringers  infusion ( Intravenous New Bag/Given 02/26/24 1556)  acetaminophen  (TYLENOL ) tablet 650 mg (has no administration in time range)    Or  acetaminophen  (TYLENOL ) suppository 650 mg (has no administration in time range)  oxyCODONE  (Oxy IR/ROXICODONE ) immediate release tablet 5 mg (5 mg Oral Given 02/26/24 1546)  fentaNYL  (SUBLIMAZE ) injection 12.5 mcg (has no administration in time range)  traZODone (DESYREL) tablet 50 mg (has no administration in time range)  prochlorperazine (COMPAZINE) injection 10 mg (has no administration in time range)  pantoprazole  (PROTONIX ) EC tablet 40 mg (has no administration in time range)  sodium chloride  0.9 % bolus 1,000 mL (1,000 mLs Intravenous New Bag/Given 02/26/24 1200)  morphine  (PF) 4 MG/ML injection 4 mg (4 mg Intravenous Given 02/26/24 1200)  ondansetron  (ZOFRAN ) injection 4 mg (4 mg Intravenous Given 02/26/24 1200)  pantoprazole  (PROTONIX ) injection 40 mg (40 mg Intravenous Given 02/26/24 1200)  iohexol  (OMNIPAQUE ) 300 MG/ML solution 100 mL (100 mLs Intravenous Contrast Given 02/26/24 1249)    ED Course/ Medical Decision Making/ A&P Clinical Course as of 02/26/24 1619  Tue Feb 26, 2024  1319 I-STAT showed normal BUN and creatinine.  Will proceed with CT. [MB]  1327 Reviewed case with GI on-call Dr. Alita Irwin.  Feel she probably needs stool studies and antibiotics, serial hemoglobins.  Will see in consult. [MB]  1334 Reviewed recommendations from GI with patient and she is comfortable plan for admission and further management.  I did review with pharmacy and they are recommending meropenem.  Have paged hospitalist [MB]  1357 Discussed  with [facility] hospitalist Dr. Lincoln Renshaw who will evaluate patient for admission. [MB]    Clinical Course User Index [MB] Tonya Fredrickson, MD                                 Medical Decision Making Amount and/or Complexity of Data Reviewed Labs: ordered. Radiology: ordered.  Risk Prescription drug management. Decision regarding hospitalization.   This patient complains of generalized abdominal pain rectal bleeding; this involves an extensive number of treatment Options and is a complaint that carries with it a high risk of complications and morbidity. The differential includes ischemic gut, diverticulitis, diverticulosis, hemorrhoids,  upper GI bleed, infection  I ordered, reviewed and interpreted labs, which included CBC stable, chemistries with low potassium low bicarb, lactate normal, stool studies ordered I ordered medication IV PPI nausea medication pain medication fluids IV antibiotics and reviewed PMP when indicated. I ordered imaging studies which included CT abdomen and pelvis and I independently    visualized and interpreted imaging which showed area of mild colitis Additional history obtained from patient's husband Previous records obtained and reviewed in epic including prior GI notes I consulted GI Dr. Alita Irwin and [facility] hospitalist Dr. Lincoln Renshaw and discussed lab and imaging findings and discussed disposition.  Cardiac monitoring reviewed, normal sinus rhythm Social determinants considered, patient with housing insecurity stress physically inactive Critical Interventions: None  After the interventions stated above, I reevaluated the patient and found patient's pain to be improved although still requiring IV pain medicine.  Has had no bowel movements here so far. Admission and further testing considered, she would benefit from mission for bowel rest hydration and serial hemoglobins.  GI is also going to see her.  Patient agreeable to plan for admission.         Final  Clinical Impression(s) / ED Diagnoses Final diagnoses:  Rectal bleeding  Colitis    Rx / DC Orders ED Discharge Orders     None         Tonya Fredrickson, MD 02/26/24 1622

## 2024-02-26 NOTE — H&P (Signed)
 History and Physical  [facility]  [name] ZOX:[id number] DOB: Feb 04, 1961 DOA: 02/26/2024  PCP: Lory Rough., PA-C  Patient coming from: Home  Level of care: Med-Surg  I have personally briefly reviewed patient's old medical records in [facility]  Chief Complaint: rectal bleeding and abdominal pain  HPI: [name] is a 63 year old female with history of COPD, history of CVA, rheumatoid arthritis, diverticulosis, epilepsy, fibromyalgia, IBS with chronic idiopathic constipation, dyspepsia, dysphagia, history of gastritis and gastroduodenitis, chronic insomnia, metabolic dysfunction-associated steatotic liver disease (MASLD), OSA, RLS, Primary biliary cirrhosis presented to the emergency department complaining of lower abdominal pain, back pain and reported having rectal bleeding that started yesterday.  She reported that she had not been feeling well most of the day yesterday.  She says that she was not able to eat much food other than having some fruit for supper due to overall malaise.  She reported that she started having bright red blood in her stool with some blood clots yesterday evening.  She also had intermittent bouts of severe cramping abdominal pain.  She was having a lot of flatus as well.  [place].  No other family members have been ill.  She ended up going to the urgent care today and they advised her to come to the emergency department for CT scanning.  She denies having fever and chills.  She was afebrile with a temperature of 97.5, pulse 68, blood pressure 146/98.  She was 100% on room air oxygen.  Her sodium was 140, potassium 3.2, BUN 6, creatinine 0.58.  Her lactic acid was 1.3.  Her WBC was 10.3 and hemoglobin 15.9.  Her CT scan showed findings of mild colitis and sigmoid diverticulosis.  GI was consulted and Dr. Alita Irwin suggested that this could be a diverticular bleed versus ischemic colitis and  recommended IV antibiotics and stool studies.  Patient was started on IV meropenem after consultation with Pharm.D. given multiple reported allergies to antibiotics and medications.  Patient is being admitted to the hospital for further management and supportive measures.    Past Medical History:  Diagnosis Date   Allergic rhinitis    uses Flonase  daily as needed and takes CLaritin  daily   Anxiety    takes Xanax  daily as needed   Asthma    Albuterol  inhaler prn;SYmbicort  daily   Cerebral vascular malformation    sees dr Larrie Po for monitoring as needed, sees dr lewitt for headaches every 4 months   COPD (chronic obstructive pulmonary disease) (HCC)    Depression    takes Citalopram  daily   Diverticulitis at age 43   Eczema    Fibromyalgia    GERD (gastroesophageal reflux disease)    takes Protonix  daily   Hard of hearing    History of kidney stones    History of migraine    last one 10+yrs ago   History of staph infection 81yrs ago   Hyperlipidemia    takes Pravastatin  daily   IBS (irritable bowel syndrome)    mixed   Insomnia    Joint pain    Lactose intolerance    Nausea    takes Zofran  daily as needed   PFO (patent foramen ovale)    Plaque psoriasis    PONV (postoperative nausea and vomiting)    Postoperative anemia due to acute blood loss 02/08/2016   Primary localized osteoarthritis of left knee  Primary localized osteoarthritis of right knee 11/03/2015   Rheumatoid arthritis(714.0) 10/16/2008   oa and ra;Rhemicade IV every 6wks and Metotrexate weekly   Seizures (HCC) 6months ago 03/21/14   takes Depakote  daily   Shortness of breath    with exertion   Sleep apnea    study done >42yrs ago;uses CPAP nightly   Spinal headache    patient states that she thinks she had a spinal headache a long time ago   Stress incontinence    Stroke (HCC) 03/26/2013   left sided weakness   Thyroid  cyst     Past Surgical History:  Procedure Laterality Date   APPENDECTOMY  1981    BIOPSY  12/16/2019   Procedure: BIOPSY;  Surgeon: Tami Falcon, MD;  Location: [facility];  Service: Endoscopy;;   BIOPSY  07/19/2023   Procedure: BIOPSY;  Surgeon: Hargis Lias, MD;  Location: [facility];  Service: Endoscopy;;   CARDIAC CATHETERIZATION  2004   CHOLECYSTECTOMY  1981   COLONOSCOPY     COLONOSCOPY WITH PROPOFOL  N/A 12/16/2019   Procedure: COLONOSCOPY WITH PROPOFOL ;  Surgeon: Tami Falcon, MD;  Location: [facility];  Service: Endoscopy;  Laterality: N/A;   CORONARY PRESSURE/FFR STUDY N/A 07/25/2022   Procedure: INTRAVASCULAR PRESSURE WIRE/FFR STUDY;  Surgeon: Cody Das, MD;  Location: [facility] CV [facility];  Service: Cardiovascular;  Laterality: N/A;   ESOPHAGOGASTRODUODENOSCOPY     ESOPHAGOGASTRODUODENOSCOPY (EGD) WITH PROPOFOL  N/A 07/19/2023   Procedure: ESOPHAGOGASTRODUODENOSCOPY (EGD) WITH PROPOFOL ;  Surgeon: Hargis Lias, MD;  Location: [facility];  Service: Endoscopy;  Laterality: N/A;  12:45;asa 1-2, bumped to 1:00 for lunch to fit - messaged Tanya   FOOT SURGERY  1999   right ankle   HERNIA REPAIR  2006   x2   INCISIONAL HERNIA REPAIR  05/20/2012   Procedure: HERNIA REPAIR INCISIONAL;  Surgeon: Brandy Cal. Cornett, MD;  Location: [facility] ORS;  Service: General;  Laterality: N/A;   INCONTINENCE SURGERY  2010   sling done    KIDNEY STONE SURGERY  2001   KNEE ARTHROSCOPY  1992   left   left foot plating and scarping for arthritis  2011   LEFT HEART CATH AND CORONARY ANGIOGRAPHY N/A 07/25/2022   Procedure: LEFT HEART CATH AND CORONARY ANGIOGRAPHY;  Surgeon: Cody Das, MD;  [facility]: [facility];  Service: Cardiovascular;  Laterality: N/A;   POLYPECTOMY  07/19/2023   Procedure: POLYPECTOMY;  Surgeon: Hargis Lias, MD;  [facility]: [facility];  Service: Endoscopy;;   right ear tube insertion  2011   TEE WITHOUT CARDIOVERSION N/A 05/15/2013   Procedure: TRANSESOPHAGEAL ECHOCARDIOGRAM (TEE);  Surgeon: Jessica Morn, MD;   Location: [facility] ENDOSCOPY;  Service: Cardiovascular;  Laterality: N/A;   thryoid biopsy     TONSILLECTOMY AND ADENOIDECTOMY  04/01/2014   TONSILLECTOMY AND ADENOIDECTOMY Bilateral 04/01/2014   Procedure: BILATERAL TONSILLECTOMY AND ADENOIDECTOMY;  Surgeon: Lawence Press, MD;  Location: [facility] OR;  Service: ENT;  Laterality: Bilateral;   TOTAL ABDOMINAL HYSTERECTOMY  2001   TOTAL KNEE ARTHROPLASTY Right 11/03/2015   TOTAL KNEE ARTHROPLASTY Right 11/03/2015   Procedure: RIGHT TOTAL KNEE ARTHROPLASTY/RIGHT;  Surgeon: Elly Habermann, MD;  Location: [facility] OR;  Service: Orthopedics;  Laterality: Right;   TOTAL KNEE ARTHROPLASTY Left 02/07/2016   Procedure: TOTAL KNEE ARTHROPLASTY;  Surgeon: Elly Habermann, MD;  Location: [facility] OR;  Service: Orthopedics;  Laterality: Left;   TUBAL LIGATION  1990     reports that she quit smoking about 24  years ago. Her smoking use included cigarettes. She started smoking about 54 years ago. She has a 60 pack-year smoking history. She has never used smokeless tobacco. She reports current alcohol use. She reports that she does not use drugs.  Allergies  Allergen Reactions   Aquacel [Carboxymethylcellulose] Other (See Comments)    Blisters    Dextromethorphan Anaphylaxis   Nickel Rash    Turn read   Sulfonamide Derivatives Anaphylaxis, Hives and Swelling    REACTION: swelling, tongue and hives   Aspirin  Nausea And Vomiting    REACTION: severe gi upset.  Pt states she can tolerate 81 mg asa only.    Ciprofloxacin Itching and Swelling    REACTION: angioedema, urticaria   Cosyntropin Hives and Swelling    REACTION: swelling, hives   Adhesive [Tape] Other (See Comments)    Blisters skin   Amoxicillin Nausea And Vomiting    REACTION: GI upset   Latex Rash    Blisters   Moxifloxacin Other (See Comments)    gi upset   Nsaids Other (See Comments)    Gi upset    Family History  Problem Relation Age of Onset   Ovarian cancer Mother    Diabetes Maternal Grandmother    Arthritis  Maternal Grandmother    Diabetes Brother    Diabetes Paternal Grandmother    Diabetes Maternal Grandfather    Diabetes Paternal Grandfather    Heart disease Brother    Heart disease Other        Uncle   Breast cancer Maternal Aunt     Prior to Admission medications   Medication Sig Start Date End Date Taking? Authorizing Provider  albuterol  (VENTOLIN  HFA) 108 (90 Base) MCG/ACT inhaler Inhale 1-2 puffs into the lungs every 6 (six) hours as needed for wheezing or shortness of breath.    [provider]  ALPRAZolam  (XANAX ) 0.5 MG tablet Take 0.5 mg by mouth at bedtime. 12/29/20   [provider]  amLODipine  (NORVASC ) 5 MG tablet Take 1 tablet (5 mg total) by mouth daily. 02/22/24   Carie Charity, NP  aspirin  EC 81 MG tablet Take 81 mg by mouth at bedtime.    [provider]  azelastine (OPTIVAR) 0.05 % ophthalmic solution 1 drop 2 (two) times daily.    [provider]  Calcium -Phosphorus-Vitamin D (CITRACAL +D3 PO) Take 1 tablet by mouth 2 (two) times daily.    [provider]  ipratropium (ATROVENT) 0.03 % nasal spray Place 2 sprays into the nose in the morning and at bedtime. 05/18/21   [provider]  linaclotide  (LINZESS ) 145 MCG CAPS capsule Take 1 capsule (145 mcg total) by mouth daily. 10/23/23 04/20/24  Hargis Lias, MD  nitroGLYCERIN  (NITROSTAT ) 0.4 MG SL tablet Place 1 tablet (0.4 mg total) under the tongue every 5 (five) minutes as needed for chest pain. 03/13/23 11/26/23  Knox Perl, MD  OVER THE COUNTER MEDICATION Bio True eye drops daily.    [provider]  pantoprazole  (PROTONIX ) 40 MG tablet TAKE 1 TABLET BY MOUTH DAILY 02/06/24   Ahmed, Forbes Ida, MD  pravastatin  (PRAVACHOL ) 40 MG tablet Take 40 mg by mouth daily.    [provider]  PRESCRIPTION MEDICATION In Cholesterol study. Has injection once a month.has been in study for 3 years as of 12/07/23 and has 6 months to left in study. ( Will finish around end  of August 2025 )    [provider]  topiramate  (TOPAMAX ) 100 MG tablet  Take 100 mg by mouth 2 (two) times daily. 04/10/21   [provider]  triamcinolone cream (KENALOG) 0.1 % Apply 1 application topically 2 (two) times daily as needed (skin irritation.).  07/09/19   [provider]  ursodiol  (ACTIGALL ) 300 MG capsule Take 1 capsule (300 mg total) by mouth 3 (three) times daily. 12/07/23   Hargis Lias, MD    Physical Exam: Vitals:   02/26/24 1119  BP: (!) 146/98  Pulse: 68  Resp: 18  Temp: (!) 97.5 F (36.4 C)  TempSrc: Temporal  SpO2: 100%  Weight: 85.4 kg  Height: 5\' 5"  (1.651 m)    Constitutional: NAD, calm, comfortable Eyes: PERRL, lids and conjunctivae normal ENMT: Mucous membranes are moist. Posterior pharynx clear of any exudate or lesions.Normal dentition.  Neck: normal, supple, no masses, no thyromegaly Respiratory: clear to auscultation bilaterally, no wheezing, no crackles. Normal respiratory effort. No accessory muscle use.  Cardiovascular: normal s1, s2 sounds, no murmurs / rubs / gallops. No extremity edema. 2+ pedal pulses. No carotid bruits.  Abdomen: soft but mildly distended, diffuse LLQ tenderness, no masses palpated. No hepatosplenomegaly. Bowel sounds hyperactive.  Musculoskeletal: no clubbing / cyanosis. No joint deformity upper and lower extremities. Good ROM, no contractures. Normal muscle tone.  Skin: no rashes, lesions, ulcers. No induration Neurologic: CN 2-12 grossly intact. Sensation intact, DTR normal. Strength 5/5 in all 4.  Psychiatric: Normal judgment and insight. Alert and oriented x 3. Normal mood.   Labs on Admission: I have personally reviewed following labs and imaging studies  CBC: Recent Labs  Lab 02/26/24 1134  WBC 10.3  NEUTROABS 7.1  HGB 15.9*  HCT 44.4  MCV 90.2  PLT 244   Basic Metabolic Panel: No results for input(s): "NA", "K", "CL", "CO2", "GLUCOSE", "BUN", "CREATININE", "CALCIUM ", "MG",  "PHOS" in the last 168 hours. GFR: CrCl cannot be calculated (Patient's most recent lab result is older than the maximum 21 days allowed.). Liver Function Tests: No results for input(s): "AST", "ALT", "ALKPHOS", "BILITOT", "PROT", "ALBUMIN" in the last 168 hours. No results for input(s): "LIPASE", "AMYLASE" in the last 168 hours. No results for input(s): "AMMONIA" in the last 168 hours. Coagulation Profile: No results for input(s): "INR", "PROTIME" in the last 168 hours. Cardiac Enzymes: No results for input(s): "CKTOTAL", "CKMB", "CKMBINDEX", "TROPONINI" in the last 168 hours. BNP (last 3 results) No results for input(s): "PROBNP" in the last 8760 hours. HbA1C: No results for input(s): "HGBA1C" in the last 72 hours. CBG: No results for input(s): "GLUCAP" in the last 168 hours. Lipid Profile: No results for input(s): "CHOL", "HDL", "LDLCALC", "TRIG", "CHOLHDL", "LDLDIRECT" in the last 72 hours. Thyroid  Function Tests: No results for input(s): "TSH", "T4TOTAL", "FREET4", "T3FREE", "THYROIDAB" in the last 72 hours. Anemia Panel: No results for input(s): "VITAMINB12", "FOLATE", "FERRITIN", "TIBC", "IRON", "RETICCTPCT" in the last 72 hours. Urine analysis:    Component Value Date/Time   COLORURINE YELLOW 09/20/2014 1206   APPEARANCEUR CLOUDY (A) 09/20/2014 1206   LABSPEC 1.015 09/20/2014 1206   PHURINE 7.5 09/20/2014 1206   GLUCOSEU NEGATIVE 09/20/2014 1206   HGBUR LARGE (A) 09/20/2014 1206   BILIRUBINUR NEGATIVE 09/20/2014 1206   KETONESUR TRACE (A) 09/20/2014 1206   PROTEINUR NEGATIVE 09/20/2014 1206   UROBILINOGEN 0.2 09/20/2014 1206   NITRITE NEGATIVE 09/20/2014 1206   LEUKOCYTESUR TRACE (A) 09/20/2014 1206    Radiological Exams on Admission: CT ABDOMEN PELVIS W CONTRAST Result Date: 02/26/2024 CLINICAL DATA:  Acute non localized abdominal pain. EXAM: CT ABDOMEN AND  PELVIS WITH CONTRAST TECHNIQUE: Multidetector CT imaging of the abdomen and pelvis was performed using the  standard protocol following bolus administration of intravenous contrast. RADIATION DOSE REDUCTION: This exam was performed according to the departmental dose-optimization program which includes automated exposure control, adjustment of the mA and/or kV according to patient size and/or use of iterative reconstruction technique. CONTRAST:  OMNIPAQUE  IOHEXOL  300 MG/ML  SOLN COMPARISON:  12/06/2023 FINDINGS: Lower Chest: No acute findings. Hepatobiliary: No suspicious hepatic masses identified. Prior cholecystectomy. No evidence of biliary obstruction. Pancreas:  No mass or inflammatory changes. Spleen: Within normal limits in size and appearance. Adrenals/Urinary Tract: No suspicious masses identified. Several 2-3 mm nonobstructing calculi are again seen in the lower pole of the left kidney. No evidence of ureteral calculi or hydronephrosis. Unremarkable unopacified urinary bladder. Stomach/Bowel: Mild wall thickening is seen involving the splenic flexure and descending colon, consistent with colitis. Sigmoid diverticulosis is noted, without signs of diverticulitis in this region. No evidence of bowel obstruction or abnormal fluid collections. Vascular/Lymphatic: No pathologically enlarged lymph nodes. No acute vascular findings. Reproductive: Prior hysterectomy noted. Adnexal regions are unremarkable in appearance. Other: Surgical mesh again seen within the lower anterior abdominal wall, without evidence of recurrent hernia. Musculoskeletal:  No suspicious bone lesions identified. IMPRESSION: Mild colitis involving the splenic flexure and descending colon. Sigmoid diverticulosis, without radiographic evidence of diverticulitis. Tiny nonobstructing left renal calculi. No evidence of ureteral calculi or hydronephrosis. Electronically Signed   By: Marlyce Sine M.D.   On: 02/26/2024 13:15    EKG: Independently reviewed.   Assessment/Plan Principal Problem:   Hemorrhagic colitis Active Problems:    Obstructive sleep apnea   RESTLESS LEGS SYNDROME   Irritable bowel syndrome   Dysphagia   Panic anxiety syndrome   Rheumatoid arthritis (HCC)   Seizures (HCC)   History of Stroke   Fibromyalgia   Insomnia   Dyspepsia   Metabolic dysfunction-associated steatotic liver disease (MASLD)   Gastritis and gastroduodenitis   Chronic idiopathic constipation   Primary biliary cirrhosis (HCC)   Rectal bleeding   Rectal bleeding Hemorrhagic Colitis LLQ abdominal pain  - working diagnosis of sigmoid diverticular bleed and will begin with supportive measures and test stool with GI pathogen panel for parasites - continue IV fluids as ordered - continue IV meropenem  - IV pain and nausea medication ordered - replace electrolytes as needed - discussed case with GI Dr. Alita Irwin and he noted that if after 24 hour no blood per rectum and stable HBG, would rec outpatient colonoscopy 6-8 weeks   Hypokalemia - replace IV and check Mg - follow BMP   LLQ abdominal pain - no findings of diverticulitis on CT scan - treating supportively with IV pain and nausea medication  IBS with intermittent constipation and diarrhea - PT is working with Dr. Alita Irwin in the outpatient setting to get this stabilized  HTN - resume home medications  GAD/Depression - resume home medications for behavioral health   GERD - resume pantoprazole  for GI protection  OSA  - will offer nightly CPAP while in hospital   Hyperlipidemia -resume home pravastatin  40 mg daily   Chronic headaches - resume home topamax     DVT prophylaxis: SCDs   Code Status: Full   Family Communication:   Disposition Plan: anticipating home   Consults called: discussed with GI Dr. Alita Irwin   Admission status: INP Time spent: 70 mins  Level of care: Med-Surg Faustino Hook MD [facility] How to contact the [facility] Attending or Consulting  provider 7A - 7P or covering provider during after hours 7P -7A, for this patient?  Check the  care team in [facility] and look for a) attending/consulting [facility] provider listed and b) the [facility] team listed Log into [email or web address] and use [facility]'s universal password to access. If you do not have the password, please contact the hospital operator. Locate the [facility] provider you are looking for under [facility] and page to a number that you can be directly reached. If you still have difficulty reaching the provider, please page the [facility] (Director on Call) for the Hospitalists listed on amion for assistance.   If 7PM-7AM, please contact night-coverage [email or web address] Password [email or web address]  02/26/2024, 2:09 PM

## 2024-02-27 DIAGNOSIS — K529 Noninfective gastroenteritis and colitis, unspecified: Secondary | ICD-10-CM | POA: Diagnosis not present

## 2024-02-27 LAB — HEMOGLOBIN AND HEMATOCRIT, BLOOD
HCT: 41.4 % (ref 36.0–46.0)
Hemoglobin: 14.7 g/dL (ref 12.0–15.0)

## 2024-02-27 LAB — BASIC METABOLIC PANEL WITH GFR
Anion gap: 7 (ref 5–15)
BUN: <5 mg/dL — ABNORMAL LOW (ref 8–23)
CO2: 17 mmol/L — ABNORMAL LOW (ref 22–32)
Calcium: 8.3 mg/dL — ABNORMAL LOW (ref 8.9–10.3)
Chloride: 116 mmol/L — ABNORMAL HIGH (ref 98–111)
Creatinine, Ser: 0.48 mg/dL (ref 0.44–1.00)
GFR, Estimated: >60 mL/min (ref >60)
Glucose, Bld: 98 mg/dL (ref 70–99)
Potassium: 3.2 mmol/L — ABNORMAL LOW (ref 3.5–5.1)
Sodium: 140 mmol/L (ref 135–145)

## 2024-02-27 LAB — CBC
HCT: 40.3 % (ref 36.0–46.0)
Hemoglobin: 14.3 g/dL (ref 12.0–15.0)
MCH: 32.8 pg (ref 26.0–34.0)
MCHC: 35.5 g/dL (ref 30.0–36.0)
MCV: 92.4 fL (ref 80.0–100.0)
Platelets: 194 10*3/uL (ref 150–400)
RBC: 4.36 MIL/uL (ref 3.87–5.11)
RDW: 12.8 % (ref 11.5–15.5)
WBC: 8.2 10*3/uL (ref 4.0–10.5)
nRBC: 0 % (ref 0.0–0.2)

## 2024-02-27 LAB — MAGNESIUM: Magnesium: 2 mg/dL (ref 1.7–2.4)

## 2024-02-27 MED ORDER — POTASSIUM CHLORIDE CRYS ER 20 MEQ PO TBCR
40.0000 meq | EXTENDED_RELEASE_TABLET | Freq: Two times a day (BID) | ORAL | Status: AC
  Administered 2024-02-27 (×2): 40 meq via ORAL
  Filled 2024-02-27 (×2): qty 2

## 2024-02-27 MED ORDER — DICYCLOMINE HCL 10 MG PO CAPS
10.0000 mg | ORAL_CAPSULE | Freq: Three times a day (TID) | ORAL | Status: DC
  Administered 2024-02-27 – 2024-02-29 (×8): 10 mg via ORAL
  Filled 2024-02-27 (×8): qty 1

## 2024-02-27 NOTE — Progress Notes (Signed)
 PROGRESS NOTE    [name]  ZOX:[id number] DOB: [birth year] DOA: 02/26/2024 PCP: [name]., PA-C   Brief Narrative:     Summar A [name] is a 63 year old female with history of COPD, history of CVA, rheumatoid arthritis, diverticulosis, epilepsy, fibromyalgia, IBS with chronic idiopathic constipation, dyspepsia, dysphagia, history of gastritis and gastroduodenitis, chronic insomnia, metabolic dysfunction-associated steatotic liver disease (MASLD), OSA, RLS, Primary biliary cirrhosis presented to the emergency department complaining of lower abdominal pain, back pain and reported having rectal bleeding that started yesterday.  Patient was admitted for evaluation management of abdominal pain and rectal bleeding likely due to infectious versus ischemic colitis.  She continues to have ongoing severe symptomatology and cannot tolerate diet.  Assessment & Plan:   Principal Problem:   Hemorrhagic colitis Active Problems:   Obstructive sleep apnea   RESTLESS LEGS SYNDROME   Irritable bowel syndrome   Dysphagia   Panic anxiety syndrome   Rheumatoid arthritis (HCC)   Seizures (HCC)   History of Stroke   Fibromyalgia   Insomnia   Dyspepsia   Metabolic dysfunction-associated steatotic liver disease (MASLD)   Gastritis and gastroduodenitis   Chronic idiopathic constipation   Primary biliary cirrhosis (HCC)   Rectal bleeding  Assessment and Plan:   Rectal bleeding Hemorrhagic Colitis LLQ abdominal pain  - working diagnosis of sigmoid diverticular bleed and will begin with supportive measures and test stool with GI pathogen panel for parasites - continue IV fluids as ordered - continue IV meropenem  - IV pain and nausea medication ordered - replace electrolytes as needed - Appreciate GI evaluations with plan to continue IV antibiotics and dietary advancement as tolerated   Hypokalemia - replace IV and check Mg - follow BMP    LLQ abdominal pain - no findings of  diverticulitis on CT scan - treating supportively with IV pain and nausea medication -Is not helped by oral narcotics   IBS with intermittent constipation and diarrhea - PT is working with Dr. Alita [name] in the outpatient setting to get this stabilized   HTN - resume home medications   GAD/Depression - resume home medications for behavioral health    GERD - resume pantoprazole  for GI protection   OSA  - will offer nightly CPAP while in hospital    Hyperlipidemia -resume home pravastatin  40 mg daily    Chronic headaches - resume home topamax     DVT prophylaxis:SCDs Code Status: Full Family Communication: None at bedside Disposition Plan:  Status is: Inpatient Remains inpatient appropriate because: Need for ongoing IV medications.  Consultants:  GI  Procedures:  None  Antimicrobials:  Anti-infectives (From admission, onward)    Start     Dose/Rate Route Frequency Ordered Stop   02/26/24 1400  meropenem (MERREM) 1 g in sodium chloride  0.9 % 100 mL IVPB        1 g 200 mL/hr over 30 Minutes Intravenous Every 8 hours 02/26/24 1334         Subjective: Patient seen and evaluated today with ongoing severe abdominal pain rated 8/10.  She has had multiple bowel movements overnight that were bloody.  She requires ongoing IV pain medications and cannot tolerate even her clear liquid diet due to the pain.  Objective: Vitals:   02/26/24 1516 02/26/24 2217 02/27/24 0324 02/27/24 1229  BP: (!) 152/86 (!) 141/84 126/72 (!) 107/55  Pulse: (!) 51 (!) 59 76 (!) 51  Resp:  16 18 18   Temp: 98.2 F (36.8 C) 98.4 F (36.9 C) 98  F (36.7 C) 98.5 F (36.9 C)  TempSrc: Oral Oral Oral   SpO2: 100% 96% 98% 95%  Weight: 78 kg     Height:        Intake/Output Summary (Last 24 hours) at 02/27/2024 1349 Last data filed at 02/27/2024 0900 Gross per 24 hour  Intake 1759.81 ml  Output --  Net 1759.81 ml   Filed Weights   02/26/24 1119 02/26/24 1516  Weight: 85.4 kg 78 kg     Examination:  General exam: Appears calm and comfortable  Respiratory system: Clear to auscultation. Respiratory effort normal. Cardiovascular system: S1 & S2 heard, RRR.  Gastrointestinal system: Abdomen is soft, tender to palpation throughout Central nervous system: Alert and awake Extremities: No edema Skin: No significant lesions noted Psychiatry: Flat affect.    Data Reviewed: I have personally reviewed following labs and imaging studies  CBC: Recent Labs  Lab 02/26/24 1134 02/27/24 0431 02/27/24 0925  WBC 10.3 8.2  --   NEUTROABS 7.1  --   --   HGB 15.9* 14.3 14.7  HCT 44.4 40.3 41.4  MCV 90.2 92.4  --   PLT 244 194  --    Basic Metabolic Panel: Recent Labs  Lab 02/26/24 1134 02/27/24 0431  NA 140 140  K 3.2* 3.2*  CL 113* 116*  CO2 16* 17*  GLUCOSE 112* 98  BUN 6* <5*  CREATININE 0.58 0.48  CALCIUM  9.4 8.3*  MG 2.2 2.0   GFR: Estimated Creatinine Clearance: 73.3 mL/min (by C-G formula based on SCr of 0.48 mg/dL). Liver Function Tests: Recent Labs  Lab 02/26/24 1134  AST 26  ALT 37  ALKPHOS 70  BILITOT 0.8  PROT 7.1  ALBUMIN 4.3   No results for input(s): "LIPASE", "AMYLASE" in the last 168 hours. No results for input(s): "AMMONIA" in the last 168 hours. Coagulation Profile: No results for input(s): "INR", "PROTIME" in the last 168 hours. Cardiac Enzymes: No results for input(s): "CKTOTAL", "CKMB", "CKMBINDEX", "TROPONINI" in the last 168 hours. BNP (last 3 results) No results for input(s): "PROBNP" in the last 8760 hours. HbA1C: No results for input(s): "HGBA1C" in the last 72 hours. CBG: No results for input(s): "GLUCAP" in the last 168 hours. Lipid Profile: No results for input(s): "CHOL", "HDL", "LDLCALC", "TRIG", "CHOLHDL", "LDLDIRECT" in the last 72 hours. Thyroid  Function Tests: No results for input(s): "TSH", "T4TOTAL", "FREET4", "T3FREE", "THYROIDAB" in the last 72 hours. Anemia Panel: No results for input(s): "VITAMINB12",  "FOLATE", "FERRITIN", "TIBC", "IRON", "RETICCTPCT" in the last 72 hours. Sepsis Labs: Recent Labs  Lab 02/26/24 1132 02/26/24 1325  LATICACIDVEN 1.3 1.1    No results found for this or any previous visit (from the past 240 hours).       Radiology Studies: CT ABDOMEN PELVIS W CONTRAST Result Date: 02/26/2024 CLINICAL DATA:  Acute non localized abdominal pain. EXAM: CT ABDOMEN AND PELVIS WITH CONTRAST TECHNIQUE: Multidetector CT imaging of the abdomen and pelvis was performed using the standard protocol following bolus administration of intravenous contrast. RADIATION DOSE REDUCTION: This exam was performed according to the departmental dose-optimization program which includes automated exposure control, adjustment of the mA and/or kV according to patient size and/or use of iterative reconstruction technique. CONTRAST:  OMNIPAQUE  IOHEXOL  300 MG/ML  SOLN COMPARISON:  12/06/2023 FINDINGS: Lower Chest: No acute findings. Hepatobiliary: No suspicious hepatic masses identified. Prior cholecystectomy. No evidence of biliary obstruction. Pancreas:  No mass or inflammatory changes. Spleen: Within normal limits in size and appearance. Adrenals/Urinary Tract: No suspicious  masses identified. Several 2-3 mm nonobstructing calculi are again seen in the lower pole of the left kidney. No evidence of ureteral calculi or hydronephrosis. Unremarkable unopacified urinary bladder. Stomach/Bowel: Mild wall thickening is seen involving the splenic flexure and descending colon, consistent with colitis. Sigmoid diverticulosis is noted, without signs of diverticulitis in this region. No evidence of bowel obstruction or abnormal fluid collections. Vascular/Lymphatic: No pathologically enlarged lymph nodes. No acute vascular findings. Reproductive: Prior hysterectomy noted. Adnexal regions are unremarkable in appearance. Other: Surgical mesh again seen within the lower anterior abdominal wall, without evidence of  recurrent hernia. Musculoskeletal:  No suspicious bone lesions identified. IMPRESSION: Mild colitis involving the splenic flexure and descending colon. Sigmoid diverticulosis, without radiographic evidence of diverticulitis. Tiny nonobstructing left renal calculi. No evidence of ureteral calculi or hydronephrosis. Electronically Signed   By: Marlyce Sine M.D.   On: 02/26/2024 13:15        Scheduled Meds:  ALPRAZolam   0.5 mg Oral QHS   amLODipine   5 mg Oral Daily   aspirin  EC  81 mg Oral QHS   dicyclomine  10 mg Oral TID AC & HS   ipratropium  2 spray Each Nare BID   ketotifen  1 drop Right Eye BID   linaclotide   145 mcg Oral Daily   pantoprazole   40 mg Oral QPM   potassium chloride   40 mEq Oral BID   pravastatin   40 mg Oral Daily   saccharomyces boulardii  250 mg Oral BID   topiramate   100 mg Oral QHS   topiramate   50 mg Oral Daily   ursodiol   300 mg Oral QODAY   Continuous Infusions:  lactated ringers  75 mL/hr at 02/27/24 0604   meropenem (MERREM) IV 1 g (02/27/24 1336)     LOS: 1 day    Time spent: 55 minutes    Khaleef Ruby Loran Rock, DO [facility]  If 7PM-7AM, please contact night-coverage [email or web address] 02/27/2024, 1:49 PM

## 2024-02-27 NOTE — Consult Note (Signed)
 Gastroenterology Consult   Referring Provider: No ref. provider found Primary Care Physician:  Lory Rough., PA-C Primary Gastroenterologist:  Dr.Ahmed   Patient ID: TANESHA VULLO; [id number]; [birth year]   Admit date: 02/26/2024  LOS: 1 day   Date of Consultation: 02/27/2024  Reason for Consultation:  rectal bleeding, colitis   History of Present Illness   ADRIEANA MEUCCI is a 63 y.o. year old female with history of epilepsy, COPD, CVA, rheumatoid arthritis on adalimumab who presented to the ED yesterday with lower abdominal pain, loose bloody stools. Endorsed constipation prior to onset of symptoms  CT A/P with contrast with mild colitis of splenic flexure and descending colon, no diverticulitis  Hospitalist consulted with Dr. Alita Irwin who recommended stool studies, antibiotics and outpatient colonoscopy in 6-8 weeks as long as bleeding tapered off and hgb remained stable over the next 24 hours   Last hgb 14.3 today GI pathogen panel needs to be collected Started on meropenem 1g q8h (given multiple allergy history)  Present:  States sudden onset of abdominal pain and  around 0300 yesterday morning, began seen [facility] with clots around 0600 with persistent rectal bleeding since then. She reports every time she eats she has worsening pain. Bleeding has persisted. No real stools at this time, mostly just passing blood now. Has had some cold sweats. Endorses nausea. No vomiting. Denies recent sick contacts. Does not think she has had a fever. She reports history of IBS, previously with more diarrhea but then was started on some medications that caused constipation. She notes harder stools prior to onset of abdominal cramping and rectal bleeding. Abdominal pain worse in LLQ, radiating to lower back. History of colitis in her teens.   She recently started phentermine 15mg  for weight loss about 2 months ago. Has lost about 20 pounds. Is not taking any NSAIDs.     Last TCS: (Dr. Tova Fresh)  2021- Few scattered diverticula in the sigmoid colon.                           - The examined portion of the ileum was normal.                           - The examination was otherwise normal-random                            biopsies done.  Past Medical History:  Diagnosis Date   Allergic rhinitis    uses Flonase  daily as needed and takes CLaritin  daily   Anxiety    takes Xanax  daily as needed   Asthma    Albuterol  inhaler prn;SYmbicort  daily   Cerebral vascular malformation    sees dr Larrie Po for monitoring as needed, sees dr lewitt for headaches every 4 months   COPD (chronic obstructive pulmonary disease) (HCC)    Depression    takes Citalopram  daily   Diverticulitis at age 51   Eczema    Fibromyalgia    GERD (gastroesophageal reflux disease)    takes Protonix  daily   Hard of hearing    History of kidney stones    History of migraine    last one 10+yrs ago   History of staph infection 63yrs ago   Hyperlipidemia    takes Pravastatin  daily   IBS (irritable bowel syndrome)    mixed   Insomnia    Joint  pain    Lactose intolerance    Nausea    takes Zofran  daily as needed   PFO (patent foramen ovale)    Plaque psoriasis    PONV (postoperative nausea and vomiting)    Postoperative anemia due to acute blood loss 02/08/2016   Primary localized osteoarthritis of left knee    Primary localized osteoarthritis of right knee 11/03/2015   Rheumatoid arthritis(714.0) 10/16/2008   oa and ra;Rhemicade IV every 6wks and Metotrexate weekly   Seizures (HCC) 6months ago 03/21/14   takes Depakote  daily   Shortness of breath    with exertion   Sleep apnea    study done >83yrs ago;uses CPAP nightly   Spinal headache    patient states that she thinks she had a spinal headache a long time ago   Stress incontinence    Stroke (HCC) 03/26/2013   left sided weakness   Thyroid  cyst     Past Surgical History:  Procedure Laterality Date   APPENDECTOMY  1981   BIOPSY  12/16/2019    Procedure: BIOPSY;  Surgeon: Tami Falcon, MD;  Location: [facility];  Service: Endoscopy;;   BIOPSY  07/19/2023   Procedure: BIOPSY;  Surgeon: Hargis Lias, MD;  Location: [facility];  Service: Endoscopy;;   CARDIAC CATHETERIZATION  2004   CHOLECYSTECTOMY  1981   COLONOSCOPY     COLONOSCOPY WITH PROPOFOL  N/A 12/16/2019   Procedure: COLONOSCOPY WITH PROPOFOL ;  Surgeon: Tami Falcon, MD;  Location: [facility];  Service: Endoscopy;  Laterality: N/A;   CORONARY PRESSURE/FFR STUDY N/A 07/25/2022   Procedure: INTRAVASCULAR PRESSURE WIRE/FFR STUDY;  Surgeon: Cody Das, MD;  Location: [facility];  Service: Cardiovascular;  Laterality: N/A;   ESOPHAGOGASTRODUODENOSCOPY     ESOPHAGOGASTRODUODENOSCOPY (EGD) WITH PROPOFOL  N/A 07/19/2023   Procedure: ESOPHAGOGASTRODUODENOSCOPY (EGD) WITH PROPOFOL ;  Surgeon: Hargis Lias, MD;  Location: [facility];  Service: Endoscopy;  Laterality: N/A;  12:45;asa 1-2, bumped to 1:00 for lunch to fit - messaged Tanya   FOOT SURGERY  1999   right ankle   HERNIA REPAIR  2006   x2   INCISIONAL HERNIA REPAIR  05/20/2012   Procedure: HERNIA REPAIR INCISIONAL;  Surgeon: Brandy Cal. Cornett, MD;  Location: [facility];  Service: General;  Laterality: N/A;   INCONTINENCE SURGERY  2010   sling done    KIDNEY STONE SURGERY  2001   KNEE ARTHROSCOPY  1992   left   left foot plating and scarping for arthritis  2011   LEFT HEART CATH AND CORONARY ANGIOGRAPHY N/A 07/25/2022   Procedure: LEFT HEART CATH AND CORONARY ANGIOGRAPHY;  Surgeon: Cody Das, MD;  Location: [facility];  Service: Cardiovascular;  Laterality: N/A;   POLYPECTOMY  07/19/2023   Procedure: POLYPECTOMY;  Surgeon: Hargis Lias, MD;  [facility]: [facility];  Service: Endoscopy;;   right ear tube insertion  2011   TEE WITHOUT CARDIOVERSION N/A 05/15/2013   Procedure: TRANSESOPHAGEAL ECHOCARDIOGRAM (TEE);  Surgeon: Jessica Morn, MD;  Location[facility] END[facility];   Service: Cardiovascular;  Laterality: N/A;   thryoid biopsy     TONSILLECTOMY AND ADENOIDECTOMY  04/01/2014   TONSILLECTOMY AND ADENOIDECTOMY Bilateral 04/01/2014   Procedure: BILATERAL TONSILLECTOMY AND ADENOIDECTOMY;  Surgeon: Lawence Press, MD;  Location: [facility] OR;  Service: ENT;  Laterality: Bilateral;   TOTAL ABDOMINAL HYSTERECTOMY  2001   TOTAL KNEE ARTHROPLASTY Right 11/03/2015   TOTAL KNEE ARTHROPLASTY Right 11/03/2015   Procedure: RIGHT TOTAL KNEE ARTHROPLASTY/RIGHT;  Surgeon: Elly Habermann, MD;  Location: [facility] OR;  Service: Orthopedics;  Laterality: Right;   TOTAL KNEE ARTHROPLASTY Left 02/07/2016   Procedure: TOTAL KNEE ARTHROPLASTY;  Surgeon: Elly Habermann, MD;  Location: [facility] OR;  Service: Orthopedics;  Laterality: Left;   TUBAL LIGATION  1990    Prior to Admission medications   Medication Sig Start Date End Date Taking? Authorizing Provider  albuterol  (VENTOLIN  HFA) 108 (90 Base) MCG/ACT inhaler Inhale 1-2 puffs into the lungs every 6 (six) hours as needed for wheezing or shortness of breath.   Yes [provider]  ALPRAZolam  (XANAX ) 0.5 MG tablet Take 0.5 mg by mouth at bedtime. 12/29/20  Yes [provider]  amLODipine  (NORVASC ) 5 MG tablet Take 1 tablet (5 mg total) by mouth daily. 02/22/24  Yes Carie Charity, NP  aspirin  EC 81 MG tablet Take 81 mg by mouth at bedtime.   Yes [provider]  azelastine (OPTIVAR) 0.05 % ophthalmic solution Place 1 drop into both eyes 2 (two) times daily.   Yes [provider]  ipratropium (ATROVENT) 0.03 % nasal spray Place 2 sprays into the nose in the morning and at bedtime. 05/18/21  Yes [provider]  linaclotide  (LINZESS ) 145 MCG CAPS capsule Take 1 capsule (145 mcg total) by mouth daily. 10/23/23 04/20/24 Yes Ahmed, Forbes Ida, MD  nitroGLYCERIN  (NITROSTAT ) 0.4 MG SL tablet Place 1 tablet (0.4 mg total) under the tongue every 5 (five) minutes as needed for chest pain. 03/13/23 02/26/24 Yes Knox Perl, MD  OVER  THE COUNTER MEDICATION Bio True eye drops daily.   Yes [provider]  pantoprazole  (PROTONIX ) 40 MG tablet TAKE 1 TABLET BY MOUTH DAILY 02/06/24  Yes Ahmed, Muhammad F, MD  phentermine 15 MG capsule Take 15 mg by mouth daily.   Yes [provider]  pravastatin  (PRAVACHOL ) 40 MG tablet Take 40 mg by mouth daily.   Yes [provider]  PRESCRIPTION MEDICATION In Cholesterol study. Has injection once a month.has been in study for 3 years as of 12/07/23 and has 6 months to left in study. ( Will finish around end of August 2025 )   Yes [provider]  rizatriptan (MAXALT) 5 MG tablet Take 5 mg by mouth as needed for migraine. (not more than 3 a week). May repeat in 2 hours if needed 12/06/23 12/05/24 Yes [provider]  tiZANidine (ZANAFLEX) 4 MG tablet Take 1 tablet by mouth every 6 (six) hours as needed for muscle spasms. 10/05/23  Yes [provider]  topiramate  (TOPAMAX ) 50 MG tablet Take 50-100 mg by mouth 2 (two) times daily. 50 mg am and 100 mg pm 10/19/22  Yes [provider]  ursodiol  (ACTIGALL ) 300 MG capsule Take 1 capsule (300 mg total) by mouth 3 (three) times daily. Patient taking differently: Take 300 mg by mouth every other day. 12/07/23  Yes Ahmed, Forbes Ida, MD    Current Facility-Administered Medications  Medication Dose Route Frequency Provider Last Rate Last Admin   acetaminophen  (TYLENOL ) tablet 650 mg  650 mg Oral Q6H PRN Johnson, Clanford L, MD       Or   acetaminophen  (TYLENOL ) suppository 650 mg  650 mg Rectal Q6H PRN Johnson, Clanford L, MD       ALPRAZolam  (XANAX ) tablet 0.5 mg  0.5 mg Oral QHS Johnson, Clanford L, MD   0.5 mg at 02/26/24 2137   amLODipine  (NORVASC ) tablet 5 mg  5 mg Oral Daily Rayfield Cairo, MD  5 mg at 02/26/24 1747   aspirin  EC tablet 81 mg  81 mg Oral QHS Johnson, Clanford L, MD   81 mg at 02/26/24 2137   fentaNYL  (SUBLIMAZE ) injection 12.5 mcg  12.5 mcg Intravenous Q2H PRN Lincoln Renshaw,  Clanford L, MD   12.5 mcg at 02/27/24 0610   hydrALAZINE  (APRESOLINE ) injection 10 mg  10 mg Intravenous Q4H PRN Johnson, Clanford L, MD       ipratropium (ATROVENT) 0.06 % nasal spray 2 spray  2 spray Each Nare BID Lincoln Renshaw, Clanford L, MD   2 spray at 02/26/24 2147   ketotifen (ZADITOR) 0.035 % ophthalmic solution 1 drop  1 drop Right Eye BID Lincoln Renshaw, Clanford L, MD   1 drop at 02/26/24 2138   lactated ringers  infusion   Intravenous Continuous Faustino Hook L, MD 75 mL/hr at 02/27/24 0604 New Bag at 02/27/24 0604   linaclotide  (LINZESS ) capsule 145 mcg  145 mcg Oral Daily Johnson, Clanford L, MD       meropenem (MERREM) 1 g in sodium chloride  0.9 % 100 mL IVPB  1 g Intravenous Q8H Johnson, Clanford L, MD 200 mL/hr at 02/27/24 0605 1 g at 02/27/24 1610   oxyCODONE  (Oxy IR/ROXICODONE ) immediate release tablet 5 mg  5 mg Oral Q6H PRN Johnson, Clanford L, MD   5 mg at 02/26/24 2146   pantoprazole  (PROTONIX ) EC tablet 40 mg  40 mg Oral QPM Johnson, Clanford L, MD   40 mg at 02/26/24 1746   potassium chloride  SA (KLOR-CON  M) CR tablet 40 mEq  40 mEq Oral BID Mason Sole, Pratik D, DO       pravastatin  (PRAVACHOL ) tablet 40 mg  40 mg Oral Daily Johnson, Clanford L, MD   40 mg at 02/26/24 1746   prochlorperazine (COMPAZINE) injection 10 mg  10 mg Intravenous Q4H PRN Johnson, Clanford L, MD       saccharomyces boulardii (FLORASTOR) capsule 250 mg  250 mg Oral BID Lincoln Renshaw, Clanford L, MD   250 mg at 02/26/24 2137   topiramate  (TOPAMAX ) tablet 100 mg  100 mg Oral QHS Johnson, Clanford L, MD   100 mg at 02/26/24 2137   topiramate  (TOPAMAX ) tablet 50 mg  50 mg Oral Daily Johnson, Clanford L, MD       traZODone (DESYREL) tablet 50 mg  50 mg Oral QHS PRN Johnson, Clanford L, MD       ursodiol  (ACTIGALL ) capsule 300 mg  300 mg Oral QODAY Johnson, Clanford L, MD        Allergies as of 02/26/2024 - Review Complete 02/26/2024  Allergen Reaction Noted   Aquacel [carboxymethylcellulose] Other (See Comments) 01/26/2016    Dextromethorphan Anaphylaxis 05/18/2021   Nickel Rash 01/26/2016   Sulfonamide derivatives Anaphylaxis, Hives, and Swelling 11/01/2009   Aspirin  Nausea And Vomiting    Ciprofloxacin Itching and Swelling 11/01/2009   Cosyntropin Hives and Swelling 11/01/2009   Amoxicillin Nausea And Vomiting 11/01/2009   Diclofenac sodium Dermatitis 05/19/2019   Latex Rash 03/28/2016   Moxifloxacin Other (See Comments) 11/01/2009   Nsaids Other (See Comments) 05/18/2011   Tape Other (See Comments) and Dermatitis 01/25/2016    Review of Systems   Gen: Denies any fever, chills, loss of appetite, change in weight or weight loss CV: Denies chest pain, heart palpitations, syncope, edema  Resp: Denies shortness of breath with rest, cough, wheezing, coughing up blood, and pleurisy. GI: +rectal bleeding +abdominal cramping +nausea  GU : Denies urinary burning, blood in urine, urinary frequency, and urinary  incontinence. MS: Denies joint pain, limitation of movement, swelling, cramps, and atrophy.  Derm: Denies rash, itching, dry skin, hives. Psych: Denies depression, anxiety, memory loss, hallucinations, and confusion. Heme: Denies bruising or bleeding Neuro:  Denies any headaches, dizziness, paresthesias, shaking  Physical Exam   Vital Signs in last 24 hours: Temp:  [97.5 F (36.4 C)-98.4 F (36.9 C)] 98 F (36.7 C) (05/14 0324) Pulse Rate:  [51-76] 76 (05/14 0324) Resp:  [16-18] 18 (05/14 0324) BP: (126-181)/(72-98) 126/72 (05/14 0324) SpO2:  [96 %-100 %] 98 % (05/14 0324) Weight:  [78 kg-85.4 kg] 78 kg (05/13 1516) Last BM Date : 02/26/24  General:   Alert,  Well-developed, well-nourished, pleasant and cooperative in NAD Head:  Normocephalic and atraumatic. Eyes:  Sclera clear, no icterus.   Conjunctiva pink. Ears:  Normal auditory acuity. Mouth:  No deformity or lesions, dentition normal. Neck:  Supple; no masses Lungs:  Clear throughout to auscultation.   No wheezes, crackles, or  rhonchi. No acute distress. Heart:  Regular rate and rhythm; no murmurs, clicks, rubs,  or gallops. Abdomen:  Soft, non-distended. TTP of lower abdomen, worse in LLQ and mid lower abdomen. No masses, hepatosplenomegaly or hernias noted. Normal bowel sounds, without guarding, and without rebound.   Msk:  Symmetrical without gross deformities. Normal posture. Extremities:  Without clubbing or edema. Neurologic:  Alert and  oriented x4. Skin:  Intact without significant lesions or rashes. Psych:  Alert and cooperative. Normal mood and affect.  Intake/Output from previous day: 05/13 0701 - 05/14 0700 In: 1279.8 [I.V.:786.8; IV Piggyback:493] Out: -  Intake/Output this shift: No intake/output data recorded.   Labs/Studies   Recent Labs Recent Labs    02/26/24 1134 02/27/24 0431  WBC 10.3 8.2  HGB 15.9* 14.3  HCT 44.4 40.3  PLT 244 194   BMET Recent Labs    02/26/24 1134 02/27/24 0431  NA 140 140  K 3.2* 3.2*  CL 113* 116*  CO2 16* 17*  GLUCOSE 112* 98  BUN 6* <5*  CREATININE 0.58 0.48  CALCIUM  9.4 8.3*   LFT Recent Labs    02/26/24 1134  PROT 7.1  ALBUMIN 4.3  AST 26  ALT 37  ALKPHOS 70  BILITOT 0.8    Radiology/Studies CT ABDOMEN PELVIS W CONTRAST Result Date: 02/26/2024 CLINICAL DATA:  Acute non localized abdominal pain. EXAM: CT ABDOMEN AND PELVIS WITH CONTRAST TECHNIQUE: Multidetector CT imaging of the abdomen and pelvis was performed using the standard protocol following bolus administration of intravenous contrast. RADIATION DOSE REDUCTION: This exam was performed according to the departmental dose-optimization program which includes automated exposure control, adjustment of the mA and/or kV according to patient size and/or use of iterative reconstruction technique. CONTRAST:  OMNIPAQUE  IOHEXOL  300 MG/ML  SOLN COMPARISON:  12/06/2023 FINDINGS: Lower Chest: No acute findings. Hepatobiliary: No suspicious hepatic masses identified. Prior cholecystectomy.  No evidence of biliary obstruction. Pancreas:  No mass or inflammatory changes. Spleen: Within normal limits in size and appearance. Adrenals/Urinary Tract: No suspicious masses identified. Several 2-3 mm nonobstructing calculi are again seen in the lower pole of the left kidney. No evidence of ureteral calculi or hydronephrosis. Unremarkable unopacified urinary bladder. Stomach/Bowel: Mild wall thickening is seen involving the splenic flexure and descending colon, consistent with colitis. Sigmoid diverticulosis is noted, without signs of diverticulitis in this region. No evidence of bowel obstruction or abnormal fluid collections. Vascular/Lymphatic: No pathologically enlarged lymph nodes. No acute vascular findings. Reproductive: Prior hysterectomy noted. Adnexal regions are unremarkable in  appearance. Other: Surgical mesh again seen within the lower anterior abdominal wall, without evidence of recurrent hernia. Musculoskeletal:  No suspicious bone lesions identified. IMPRESSION: Mild colitis involving the splenic flexure and descending colon. Sigmoid diverticulosis, without radiographic evidence of diverticulitis. Tiny nonobstructing left renal calculi. No evidence of ureteral calculi or hydronephrosis. Electronically Signed   By: Marlyce Sine M.D.   On: 02/26/2024 13:15    Assessment   SHINA JUNES is a 63 y.o. year old female with history of epilepsy, COPD, CVA, rheumatoid arthritis on adalimumab who presented to the ED yesterday with lower abdominal pain, loose bloody stools with CT showing colitis. Hemoglobin stable, antibiotics started and GI consulted.  Hemorrhagic colitis: acute onset of rectal bleeding and lower abdominal pain. CT with findings of colitis.  -No stool studies obtained thus far as not having diarrhea.  -Last TCS in 2021 grossly unremarkable.  -denies fevers, recent sick contacts, new meds other than phentermine or NSAIDs  -Differentials include: infectious colitis (less likely  given lack of diarrhea), ischemic/segmental colitis, low suspicion for IBD given fairly recent normal colonoscopy.   Currently on meropenem, receiving fentanyl  PRN which is helping though she has concerns about being on strong opiates. We discussed she is receiving very low dose which is considered safe in short term.  Discussed need for outpatient colonoscopy in 6-8 weeks Continue antibiotics and hopeful discharge tomorrow as long as hgb stable, clinically improving    Plan / Recommendations   Can Continue antibiotics to complete 7-10 days  Stool studies if diarrhea occurs Outpatient colonoscopy 6-8 weeks Monitor for overt GI bleeding Trend h&h Hopefully discharge tomorrow if stable Clear liquid diet  8. Pt has outpatient GI follow up on 5/29 already    02/27/2024, 9:16 AM  [place] L. Huel Centola, MSN, APRN, AGNP-C Adult-Gerontology Nurse Practitioner [facility]

## 2024-02-27 NOTE — Plan of Care (Signed)
   Problem: Education: Goal: Knowledge of General Education information will improve Description: Including pain rating scale, medication(s)/side effects and non-pharmacologic comfort measures Outcome: Progressing   Problem: Clinical Measurements: Goal: Ability to maintain clinical measurements within normal limits will improve Outcome: Progressing Goal: Diagnostic test results will improve Outcome: Progressing

## 2024-02-27 NOTE — Plan of Care (Signed)

## 2024-02-28 DIAGNOSIS — K529 Noninfective gastroenteritis and colitis, unspecified: Secondary | ICD-10-CM | POA: Diagnosis not present

## 2024-02-28 LAB — GASTROINTESTINAL PANEL BY PCR, STOOL (REPLACES STOOL CULTURE)

## 2024-02-28 LAB — BASIC METABOLIC PANEL WITH GFR
Anion gap: 7 (ref 5–15)
BUN: <5 mg/dL — ABNORMAL LOW (ref 8–23)
CO2: 19 mmol/L — ABNORMAL LOW (ref 22–32)
Calcium: 8.9 mg/dL (ref 8.9–10.3)
Chloride: 114 mmol/L — ABNORMAL HIGH (ref 98–111)
Creatinine, Ser: 0.57 mg/dL (ref 0.44–1.00)
GFR, Estimated: >60 mL/min (ref >60)
Glucose, Bld: 93 mg/dL (ref 70–99)
Potassium: 3.8 mmol/L (ref 3.5–5.1)
Sodium: 140 mmol/L (ref 135–145)

## 2024-02-28 LAB — CBC
HCT: 42 % (ref 36.0–46.0)
Hemoglobin: 14 g/dL (ref 12.0–15.0)
MCH: 31.7 pg (ref 26.0–34.0)
MCHC: 33.3 g/dL (ref 30.0–36.0)
MCV: 95 fL (ref 80.0–100.0)
Platelets: 179 10*3/uL (ref 150–400)
RBC: 4.42 MIL/uL (ref 3.87–5.11)
RDW: 13 % (ref 11.5–15.5)
WBC: 7.1 10*3/uL (ref 4.0–10.5)
nRBC: 0 % (ref 0.0–0.2)

## 2024-02-28 LAB — MAGNESIUM: Magnesium: 2.2 mg/dL (ref 1.7–2.4)

## 2024-02-28 MED ORDER — FENTANYL CITRATE PF 50 MCG/ML IJ SOSY
12.5000 ug | PREFILLED_SYRINGE | INTRAMUSCULAR | Status: DC | PRN
  Administered 2024-02-28 (×3): 12.5 ug via INTRAVENOUS
  Filled 2024-02-28 (×3): qty 1

## 2024-02-28 NOTE — Plan of Care (Signed)

## 2024-02-28 NOTE — Progress Notes (Signed)
 PROGRESS NOTE    [name]  ZOX:[id number] DOB: [birth year] DOA: 02/26/2024 PCP: Lory Rough., PA-C   Brief Narrative:     [name] is a 63 year old female with history of COPD, history of CVA, rheumatoid arthritis, diverticulosis, epilepsy, fibromyalgia, IBS with chronic idiopathic constipation, dyspepsia, dysphagia, history of gastritis and gastroduodenitis, chronic insomnia, metabolic dysfunction-associated steatotic liver disease (MASLD), OSA, RLS, Primary biliary cirrhosis presented to the emergency department complaining of lower abdominal pain, back pain and reported having rectal bleeding that started yesterday.  Patient was admitted for evaluation management of abdominal pain and rectal bleeding likely due to infectious versus ischemic colitis.  She continues to have ongoing severe symptomatology despite dietary advancement to soft.  Continues to have high IV pain medication use.  GI continues to follow.  Assessment & Plan:   Principal Problem:   Hemorrhagic colitis Active Problems:   Obstructive sleep apnea   RESTLESS LEGS SYNDROME   Irritable bowel syndrome   Dysphagia   Panic anxiety syndrome   Rheumatoid arthritis (HCC)   Seizures (HCC)   History of Stroke   Fibromyalgia   Insomnia   Dyspepsia   Metabolic dysfunction-associated steatotic liver disease (MASLD)   Gastritis and gastroduodenitis   Chronic idiopathic constipation   Primary biliary cirrhosis (HCC)   Rectal bleeding  Assessment and Plan:   Rectal bleeding Hemorrhagic Colitis LLQ abdominal pain  - working diagnosis of sigmoid diverticular bleed and will begin with supportive measures and test stool with GI pathogen panel for parasites - continue IV fluids as ordered - continue IV meropenem  - IV pain and nausea medication ordered - replace electrolytes as needed - Appreciate GI evaluations with plan to continue IV antibiotics and diet advanced to soft 5/15   LLQ abdominal pain -  no findings of diverticulitis on CT scan - treating supportively with IV pain and nausea medication -Is not helped by oral narcotics   IBS with intermittent constipation and diarrhea - PT is working with Dr. Alita Irwin in the outpatient setting to get this stabilized   HTN - resume home medications   GAD/Depression - resume home medications for behavioral health    GERD - resume pantoprazole  for GI protection   OSA  - will offer nightly CPAP while in hospital    Hyperlipidemia -resume home pravastatin  40 mg daily    Chronic headaches - resume home topamax     DVT prophylaxis:SCDs Code Status: Full Family Communication: None at bedside Disposition Plan:  Status is: Inpatient Remains inpatient appropriate because: Need for ongoing IV medications.  Consultants:  GI  Procedures:  None  Antimicrobials:  Anti-infectives (From admission, onward)    Start     Dose/Rate Route Frequency Ordered Stop   02/26/24 1400  meropenem (MERREM) 1 g in sodium chloride  0.9 % 100 mL IVPB        1 g 200 mL/hr over 30 Minutes Intravenous Every 8 hours 02/26/24 1334         Subjective: Patient seen and evaluated today with improvement in abdominal pain noted, but she still continues to have high IV pain medication use.  Eager for dietary advancement today.  Denies any nausea or vomiting and has more formed bowel movements, but still with some blood.  Objective: Vitals:   02/27/24 1229 02/27/24 2052 02/28/24 0500 02/28/24 1304  BP: (!) 107/55 139/66 116/61 (!) 116/95  Pulse: (!) 51 (!) 55 (!) 55 94  Resp: 18 18 16 18   Temp: 98.5 F (36.9 C)  99.4 F (37.4 C) 98.2 F (36.8 C) 98.1 F (36.7 C)  TempSrc:  Oral Oral Oral  SpO2: 95% 95% 96% 97%  Weight:      Height:        Intake/Output Summary (Last 24 hours) at 02/28/2024 1350 Last data filed at 02/27/2024 1844 Gross per 24 hour  Intake 480 ml  Output 100 ml  Net 380 ml   Filed Weights   02/26/24 1119 02/26/24 1516  Weight:  85.4 kg 78 kg    Examination:  General exam: Appears calm and comfortable  Respiratory system: Clear to auscultation. Respiratory effort normal. Cardiovascular system: S1 & S2 heard, RRR.  Gastrointestinal system: Abdomen is soft, tender to palpation throughout Central nervous system: Alert and awake Extremities: No edema Skin: No significant lesions noted Psychiatry: Flat affect.    Data Reviewed: I have personally reviewed following labs and imaging studies  CBC: Recent Labs  Lab 02/26/24 1134 02/27/24 0431 02/27/24 0925 02/28/24 0441  WBC 10.3 8.2  --  7.1  NEUTROABS 7.1  --   --   --   HGB 15.9* 14.3 14.7 14.0  HCT 44.4 40.3 41.4 42.0  MCV 90.2 92.4  --  95.0  PLT 244 194  --  179   Basic Metabolic Panel: Recent Labs  Lab 02/26/24 1134 02/27/24 0431 02/28/24 0441  NA 140 140 140  K 3.2* 3.2* 3.8  CL 113* 116* 114*  CO2 16* 17* 19*  GLUCOSE 112* 98 93  BUN 6* <5* <5*  CREATININE 0.58 0.48 0.57  CALCIUM  9.4 8.3* 8.9  MG 2.2 2.0 2.2   GFR: Estimated Creatinine Clearance: 73.3 mL/min (by C-G formula based on SCr of 0.57 mg/dL). Liver Function Tests: Recent Labs  Lab 02/26/24 1134  AST 26  ALT 37  ALKPHOS 70  BILITOT 0.8  PROT 7.1  ALBUMIN 4.3   No results for input(s): "LIPASE", "AMYLASE" in the last 168 hours. No results for input(s): "AMMONIA" in the last 168 hours. Coagulation Profile: No results for input(s): "INR", "PROTIME" in the last 168 hours. Cardiac Enzymes: No results for input(s): "CKTOTAL", "CKMB", "CKMBINDEX", "TROPONINI" in the last 168 hours. BNP (last 3 results) No results for input(s): "PROBNP" in the last 8760 hours. HbA1C: No results for input(s): "HGBA1C" in the last 72 hours. CBG: No results for input(s): "GLUCAP" in the last 168 hours. Lipid Profile: No results for input(s): "CHOL", "HDL", "LDLCALC", "TRIG", "CHOLHDL", "LDLDIRECT" in the last 72 hours. Thyroid  Function Tests: No results for input(s): "TSH", "T4TOTAL",  "FREET4", "T3FREE", "THYROIDAB" in the last 72 hours. Anemia Panel: No results for input(s): "VITAMINB12", "FOLATE", "FERRITIN", "TIBC", "IRON", "RETICCTPCT" in the last 72 hours. Sepsis Labs: Recent Labs  Lab 02/26/24 1132 02/26/24 1325  LATICACIDVEN 1.3 1.1    Recent Results (from the past 240 hours)  Gastrointestinal Panel by PCR , Stool     Status: None   Collection Time: 02/26/24  1:21 PM   Specimen: STOOL  Result Value Ref Range Status   Campylobacter species NOT DETECTED NOT DETECTED Final   Plesimonas shigelloides NOT DETECTED NOT DETECTED Final   Salmonella species NOT DETECTED NOT DETECTED Final   Yersinia enterocolitica NOT DETECTED NOT DETECTED Final   Vibrio species NOT DETECTED NOT DETECTED Final   Vibrio cholerae NOT DETECTED NOT DETECTED Final   Enteroaggregative E coli (EAEC) NOT DETECTED NOT DETECTED Final   Enteropathogenic E coli (EPEC) NOT DETECTED NOT DETECTED Final   Enterotoxigenic E coli (ETEC) NOT DETECTED NOT DETECTED  Final   Shiga like toxin producing E coli (STEC) NOT DETECTED NOT DETECTED Final   Shigella/Enteroinvasive E coli (EIEC) NOT DETECTED NOT DETECTED Final   Cryptosporidium NOT DETECTED NOT DETECTED Final   Cyclospora cayetanensis NOT DETECTED NOT DETECTED Final   Entamoeba histolytica NOT DETECTED NOT DETECTED Final   Giardia lamblia NOT DETECTED NOT DETECTED Final   Adenovirus F40/41 NOT DETECTED NOT DETECTED Final   Astrovirus NOT DETECTED NOT DETECTED Final   Norovirus GI/GII NOT DETECTED NOT DETECTED Final   Rotavirus A NOT DETECTED NOT DETECTED Final   Sapovirus (I, II, IV, and V) NOT DETECTED NOT DETECTED Final    Comment: Performed at [facility], [address], [place], [place] [address]         Radiology Studies: No results found.       Scheduled Meds:  ALPRAZolam   0.5 mg Oral QHS   amLODipine   5 mg Oral Daily   aspirin  EC  81 mg Oral QHS   dicyclomine  10 mg Oral TID AC & HS   ipratropium  2 spray  Each Nare BID   ketotifen  1 drop Right Eye BID   linaclotide   145 mcg Oral Daily   pantoprazole   40 mg Oral QPM   pravastatin   40 mg Oral Daily   saccharomyces boulardii  250 mg Oral BID   topiramate   100 mg Oral QHS   topiramate   50 mg Oral Daily   ursodiol   300 mg Oral QODAY   Continuous Infusions:  meropenem (MERREM) IV 1 g (02/28/24 1258)     LOS: 2 days    Time spent: 55 minutes    Vaani Morren D Mason Sole, DO [facility]  If 7PM-7AM, please contact night-coverage [email or web address] 02/28/2024, 1:50 PM

## 2024-02-28 NOTE — Plan of Care (Signed)
   Problem: Education: Goal: Knowledge of General Education information will improve Description: Including pain rating scale, medication(s)/side effects and non-pharmacologic comfort measures Outcome: Progressing   Problem: Clinical Measurements: Goal: Ability to maintain clinical measurements within normal limits will improve Outcome: Progressing Goal: Diagnostic test results will improve Outcome: Progressing

## 2024-02-28 NOTE — Progress Notes (Signed)
 Spoke with hospitalist earlier today, plans for patient to discharge if she tolerated diet.  No reports from staff about any rectal bleeding however she is having some ongoing mild abdominal pain after meals and has been receiving pain medication therefore she does not want to be discharged today.  Will follow back up with the patient tomorrow.  I will reassess how she is feeling tomorrow after breakfast and if still having pain then will likely order additional imaging.  Need to reduce use of narcotics as this can worsen ischemia.  Julian Obey, MSN, APRN, FNP-BC, AGACNP-BC [facility] at [facility]

## 2024-02-29 DIAGNOSIS — K529 Noninfective gastroenteritis and colitis, unspecified: Secondary | ICD-10-CM | POA: Diagnosis not present

## 2024-02-29 LAB — BASIC METABOLIC PANEL WITH GFR
Anion gap: 5 (ref 5–15)
BUN: 9 mg/dL (ref 8–23)
CO2: 19 mmol/L — ABNORMAL LOW (ref 22–32)
Calcium: 8.8 mg/dL — ABNORMAL LOW (ref 8.9–10.3)
Chloride: 115 mmol/L — ABNORMAL HIGH (ref 98–111)
Creatinine, Ser: 0.57 mg/dL (ref 0.44–1.00)
GFR, Estimated: >60 mL/min (ref >60)
Glucose, Bld: 122 mg/dL — ABNORMAL HIGH (ref 70–99)
Potassium: 3.3 mmol/L — ABNORMAL LOW (ref 3.5–5.1)
Sodium: 139 mmol/L (ref 135–145)

## 2024-02-29 LAB — CBC
HCT: 42.3 % (ref 36.0–46.0)
Hemoglobin: 14.5 g/dL (ref 12.0–15.0)
MCH: 32.5 pg (ref 26.0–34.0)
MCHC: 34.3 g/dL (ref 30.0–36.0)
MCV: 94.8 fL (ref 80.0–100.0)
Platelets: 197 10*3/uL (ref 150–400)
RBC: 4.46 MIL/uL (ref 3.87–5.11)
RDW: 13 % (ref 11.5–15.5)
WBC: 6.5 10*3/uL (ref 4.0–10.5)
nRBC: 0 % (ref 0.0–0.2)

## 2024-02-29 LAB — MAGNESIUM: Magnesium: 2 mg/dL (ref 1.7–2.4)

## 2024-02-29 MED ORDER — SACCHAROMYCES BOULARDII 250 MG PO CAPS
250.0000 mg | ORAL_CAPSULE | Freq: Two times a day (BID) | ORAL | 0 refills | Status: DC
Start: 2024-02-29 — End: 2024-03-13

## 2024-02-29 MED ORDER — AMLODIPINE BESYLATE 5 MG PO TABS: 5.0000 mg | ORAL_TABLET | Freq: Every day | ORAL | 0 refills | Status: DC

## 2024-02-29 MED ORDER — DICYCLOMINE HCL 10 MG PO CAPS: 10.0000 mg | ORAL_CAPSULE | Freq: Three times a day (TID) | ORAL | 0 refills | Status: AC

## 2024-02-29 MED ORDER — ONDANSETRON HCL 4 MG PO TABS: 4.0000 mg | ORAL_TABLET | Freq: Every day | ORAL | 1 refills | Status: DC | PRN

## 2024-02-29 NOTE — Discharge Summary (Signed)
 Physician Discharge Summary  KEAUNA BOLAM WGN:[id number] DOB: 11/27/1960 DOA: 02/26/2024  PCP: Lory Rough., PA-C  Admit date: 02/26/2024  Discharge date: 02/29/2024  Admitted From:Home  Disposition:  Home  Recommendations for Outpatient Follow-up:  Follow up with PCP in 1-2 weeks Continue on Bentyl and Zofran  as needed for symptomatic management Continue other home medications as prior with amlodipine  to resume in the next 2-3 days once diet is further advanced  Home Health: None  Equipment/Devices: None  Discharge Condition:Stable  CODE STATUS: Full  Diet recommendation: Heart Healthy  Brief/Interim Summary: [name] is a 63 year old female with history of COPD, history of CVA, rheumatoid arthritis, diverticulosis, epilepsy, fibromyalgia, IBS with chronic idiopathic constipation, dyspepsia, dysphagia, history of gastritis and gastroduodenitis, chronic insomnia, metabolic dysfunction-associated steatotic liver disease (MASLD), OSA, RLS, Primary biliary cirrhosis presented to the emergency department complaining of lower abdominal pain, back pain and reported having rectal bleeding that started yesterday. Patient was admitted for evaluation management of abdominal pain and rectal bleeding likely due to infectious versus ischemic colitis.  She was noted to have significant pain that required multiple doses of IV pain medications throughout her stay.  Her diet was gradually advanced and she is now tolerating more and with less symptoms and not requiring as much IV medication.  She has not had any other significant findings and was evaluated by GI.  GI pathogen panel without any significant findings either.  She is now in stable condition for discharge.  Discharge Diagnoses:  Principal Problem:   Hemorrhagic colitis Active Problems:   Obstructive sleep apnea   RESTLESS LEGS SYNDROME   Irritable bowel syndrome   Dysphagia   Panic anxiety syndrome   Rheumatoid arthritis  (HCC)   Seizures (HCC)   History of Stroke   Fibromyalgia   Insomnia   Dyspepsia   Metabolic dysfunction-associated steatotic liver disease (MASLD)   Gastritis and gastroduodenitis   Chronic idiopathic constipation   Primary biliary cirrhosis (HCC)   Rectal bleeding  Principal discharge diagnosis: Ischemic or segmental colitis with associated rectal bleed.  Discharge Instructions  Discharge Instructions     Diet - low sodium heart healthy   Complete by: As directed    Increase activity slowly   Complete by: As directed       Allergies as of 02/29/2024       Reactions   Aquacel [carboxymethylcellulose] Other (See Comments)   Blisters   Dextromethorphan Anaphylaxis   Nickel Rash   Turn read   Sulfonamide Derivatives Anaphylaxis, Hives, Swelling   Swelling of tongue   Aspirin  Nausea And Vomiting   Severe GI upset.  Pt states she can tolerate 81 mg asa only.    Ciprofloxacin Itching, Swelling   Angioedema, urticaria   Cosyntropin Hives, Swelling   Amoxicillin Nausea And Vomiting   GI upset   Diclofenac Sodium Dermatitis   Gel causes legs to break out   Latex Rash   Blisters   Moxifloxacin Other (See Comments)   GI upset   Nsaids Other (See Comments)   Gi upset   Tape Other (See Comments), Dermatitis   Blisters skin        Medication List     TAKE these medications    albuterol  108 (90 Base) MCG/ACT inhaler Commonly known as: VENTOLIN  HFA Inhale 1-2 puffs into the lungs every 6 (six) hours as needed for wheezing or shortness of breath.   ALPRAZolam  0.5 MG tablet Commonly known as: XANAX  Take 0.5 mg by mouth at  bedtime.   amLODipine  5 MG tablet Commonly known as: NORVASC  Take 1 tablet (5 mg total) by mouth daily. Start taking on: Mar 03, 2024 What changed: These instructions start on Mar 03, 2024. If you are unsure what to do until then, ask your doctor or other care provider.   aspirin  EC 81 MG tablet Take 81 mg by mouth at bedtime.   azelastine  0.05 % ophthalmic solution Commonly known as: OPTIVAR Place 1 drop into both eyes 2 (two) times daily.   dicyclomine 10 MG capsule Commonly known as: BENTYL Take 1 capsule (10 mg total) by mouth 4 (four) times daily -  before meals and at bedtime.   ipratropium 0.03 % nasal spray Commonly known as: ATROVENT Place 2 sprays into the nose in the morning and at bedtime.   linaclotide  145 MCG Caps capsule Commonly known as: LINZESS  Take 1 capsule (145 mcg total) by mouth daily.   nitroGLYCERIN  0.4 MG SL tablet Commonly known as: NITROSTAT  Place 1 tablet (0.4 mg total) under the tongue every 5 (five) minutes as needed for chest pain.   ondansetron  4 MG tablet Commonly known as: Zofran  Take 1 tablet (4 mg total) by mouth daily as needed for nausea or vomiting.   OVER THE COUNTER MEDICATION Bio True eye drops daily.   pantoprazole  40 MG tablet Commonly known as: PROTONIX  TAKE 1 TABLET BY MOUTH DAILY   phentermine 15 MG capsule Take 15 mg by mouth daily.   pravastatin  40 MG tablet Commonly known as: PRAVACHOL  Take 40 mg by mouth daily.   PRESCRIPTION MEDICATION In Cholesterol study. Has injection once a month.has been in study for 3 years as of 12/07/23 and has 6 months to left in study. ( Will finish around end of August 2025 )   rizatriptan 5 MG tablet Commonly known as: MAXALT Take 5 mg by mouth as needed for migraine. (not more than 3 a week). May repeat in 2 hours if needed   saccharomyces boulardii 250 MG capsule Commonly known as: FLORASTOR Take 1 capsule (250 mg total) by mouth 2 (two) times daily.   tiZANidine 4 MG tablet Commonly known as: ZANAFLEX Take 1 tablet by mouth every 6 (six) hours as needed for muscle spasms.   topiramate  50 MG tablet Commonly known as: TOPAMAX  Take 50-100 mg by mouth 2 (two) times daily. 50 mg am and 100 mg pm   ursodiol  300 MG capsule Commonly known as: ACTIGALL  Take 1 capsule (300 mg total) by mouth 3 (three) times daily. What  changed: when to take this        Follow-up Information     Lory Rough., PA-C. Schedule an appointment as soon as possible for a visit in 1 week(s).   Specialty: Family Medicine Contact information: [address] [place] [address] [phone]                Allergies  Allergen Reactions   Aquacel [Carboxymethylcellulose] Other (See Comments)    Blisters    Dextromethorphan Anaphylaxis   Nickel Rash    Turn read   Sulfonamide Derivatives Anaphylaxis, Hives and Swelling    Swelling of tongue   Aspirin  Nausea And Vomiting    Severe GI upset.  Pt states she can tolerate 81 mg asa only.    Ciprofloxacin Itching and Swelling    Angioedema, urticaria   Cosyntropin Hives and Swelling   Amoxicillin Nausea And Vomiting    GI upset   Diclofenac Sodium Dermatitis  Gel causes legs to break out   Latex Rash    Blisters   Moxifloxacin Other (See Comments)    GI upset   Nsaids Other (See Comments)    Gi upset   Tape Other (See Comments) and Dermatitis    Blisters skin    Consultations: GI   Procedures/Studies: CT ABDOMEN PELVIS W CONTRAST Result Date: 02/26/2024 CLINICAL DATA:  Acute non localized abdominal pain. EXAM: CT ABDOMEN AND PELVIS WITH CONTRAST TECHNIQUE: Multidetector CT imaging of the abdomen and pelvis was performed using the standard protocol following bolus administration of intravenous contrast. RADIATION DOSE REDUCTION: This exam was performed according to the departmental dose-optimization program which includes automated exposure control, adjustment of the mA and/or kV according to patient size and/or use of iterative reconstruction technique. CONTRAST:  OMNIPAQUE  IOHEXOL  300 MG/ML  SOLN COMPARISON:  12/06/2023 FINDINGS: Lower Chest: No acute findings. Hepatobiliary: No suspicious hepatic masses identified. Prior cholecystectomy. No evidence of biliary obstruction. Pancreas:  No mass or inflammatory changes. Spleen: Within normal  limits in size and appearance. Adrenals/Urinary Tract: No suspicious masses identified. Several 2-3 mm nonobstructing calculi are again seen in the lower pole of the left kidney. No evidence of ureteral calculi or hydronephrosis. Unremarkable unopacified urinary bladder. Stomach/Bowel: Mild wall thickening is seen involving the splenic flexure and descending colon, consistent with colitis. Sigmoid diverticulosis is noted, without signs of diverticulitis in this region. No evidence of bowel obstruction or abnormal fluid collections. Vascular/Lymphatic: No pathologically enlarged lymph nodes. No acute vascular findings. Reproductive: Prior hysterectomy noted. Adnexal regions are unremarkable in appearance. Other: Surgical mesh again seen within the lower anterior abdominal wall, without evidence of recurrent hernia. Musculoskeletal:  No suspicious bone lesions identified. IMPRESSION: Mild colitis involving the splenic flexure and descending colon. Sigmoid diverticulosis, without radiographic evidence of diverticulitis. Tiny nonobstructing left renal calculi. No evidence of ureteral calculi or hydronephrosis. Electronically Signed   By: Marlyce Sine M.D.   On: 02/26/2024 13:15     Discharge Exam: Vitals:   02/28/24 2042 02/29/24 0327  BP: (!) 105/52 (!) 90/54  Pulse: (!) 53 (!) 51  Resp: 16 16  Temp: 98.4 F (36.9 C) 98.5 F (36.9 C)  SpO2: 95% 98%   Vitals:   02/28/24 0500 02/28/24 1304 02/28/24 2042 02/29/24 0327  BP: 116/61 (!) 116/95 (!) 105/52 (!) 90/54  Pulse: (!) 55 94 (!) 53 (!) 51  Resp: 16 18 16 16   Temp: 98.2 F (36.8 C) 98.1 F (36.7 C) 98.4 F (36.9 C) 98.5 F (36.9 C)  TempSrc: Oral Oral Oral Oral  SpO2: 96% 97% 95% 98%  Weight:      Height:        General: Pt is alert, awake, not in acute distress Cardiovascular: RRR, S1/S2 +, no rubs, no gallops Respiratory: CTA bilaterally, no wheezing, no rhonchi Abdominal: Soft, NT, ND, bowel sounds + Extremities: no edema, no  cyanosis    The results of significant diagnostics from this hospitalization (including imaging, microbiology, ancillary and laboratory) are listed below for reference.     Microbiology: Recent Results (from the past 240 hours)  Gastrointestinal Panel by PCR , Stool     Status: None   Collection Time: 02/26/24  1:21 PM   Specimen: STOOL  Result Value Ref Range Status   Campylobacter species NOT DETECTED NOT DETECTED Final   Plesimonas shigelloides NOT DETECTED NOT DETECTED Final   Salmonella species NOT DETECTED NOT DETECTED Final   Yersinia enterocolitica NOT DETECTED NOT DETECTED Final  Vibrio species NOT DETECTED NOT DETECTED Final   Vibrio cholerae NOT DETECTED NOT DETECTED Final   Enteroaggregative E coli (EAEC) NOT DETECTED NOT DETECTED Final   Enteropathogenic E coli (EPEC) NOT DETECTED NOT DETECTED Final   Enterotoxigenic E coli (ETEC) NOT DETECTED NOT DETECTED Final   Shiga like toxin producing E coli (STEC) NOT DETECTED NOT DETECTED Final   Shigella/Enteroinvasive E coli (EIEC) NOT DETECTED NOT DETECTED Final   Cryptosporidium NOT DETECTED NOT DETECTED Final   Cyclospora cayetanensis NOT DETECTED NOT DETECTED Final   Entamoeba histolytica NOT DETECTED NOT DETECTED Final   Giardia lamblia NOT DETECTED NOT DETECTED Final   Adenovirus F40/41 NOT DETECTED NOT DETECTED Final   Astrovirus NOT DETECTED NOT DETECTED Final   Norovirus GI/GII NOT DETECTED NOT DETECTED Final   Rotavirus A NOT DETECTED NOT DETECTED Final   Sapovirus (I, II, IV, and V) NOT DETECTED NOT DETECTED Final    Comment: Performed at [facility], [address] [place], [place] [address]     Labs: BNP (last 3 results) No results for input(s): "BNP" in the last 8760 hours. Basic Metabolic Panel: Recent Labs  Lab 02/26/24 1134 02/27/24 0431 02/28/24 0441 02/29/24 0435  NA 140 140 140 139  K 3.2* 3.2* 3.8 3.3*  CL 113* 116* 114* 115*  CO2 16* 17* 19* 19*  GLUCOSE 112* 98 93 122*  BUN  6* <5* <5* 9  CREATININE 0.58 0.48 0.57 0.57  CALCIUM  9.4 8.3* 8.9 8.8*  MG 2.2 2.0 2.2 2.0   Liver Function Tests: Recent Labs  Lab 02/26/24 1134  AST 26  ALT 37  ALKPHOS 70  BILITOT 0.8  PROT 7.1  ALBUMIN 4.3   No results for input(s): "LIPASE", "AMYLASE" in the last 168 hours. No results for input(s): "AMMONIA" in the last 168 hours. CBC: Recent Labs  Lab 02/26/24 1134 02/27/24 0431 02/27/24 0925 02/28/24 0441 02/29/24 0435  WBC 10.3 8.2  --  7.1 6.5  NEUTROABS 7.1  --   --   --   --   HGB 15.9* 14.3 14.7 14.0 14.5  HCT 44.4 40.3 41.4 42.0 42.3  MCV 90.2 92.4  --  95.0 94.8  PLT 244 194  --  179 197   Cardiac Enzymes: No results for input(s): "CKTOTAL", "CKMB", "CKMBINDEX", "TROPONINI" in the last 168 hours. BNP: Invalid input(s): "POCBNP" CBG: No results for input(s): "GLUCAP" in the last 168 hours. D-Dimer No results for input(s): "DDIMER" in the last 72 hours. Hgb A1c No results for input(s): "HGBA1C" in the last 72 hours. Lipid Profile No results for input(s): "CHOL", "HDL", "LDLCALC", "TRIG", "CHOLHDL", "LDLDIRECT" in the last 72 hours. Thyroid  function studies No results for input(s): "TSH", "T4TOTAL", "T3FREE", "THYROIDAB" in the last 72 hours.  Invalid input(s): "FREET3" Anemia work up No results for input(s): "VITAMINB12", "FOLATE", "FERRITIN", "TIBC", "IRON", "RETICCTPCT" in the last 72 hours. Urinalysis    Component Value Date/Time   COLORURINE YELLOW 09/20/2014 1206   APPEARANCEUR CLOUDY (A) 09/20/2014 1206   LABSPEC 1.015 09/20/2014 1206   PHURINE 7.5 09/20/2014 1206   GLUCOSEU NEGATIVE 09/20/2014 1206   HGBUR LARGE (A) 09/20/2014 1206   BILIRUBINUR NEGATIVE 09/20/2014 1206   KETONESUR TRACE (A) 09/20/2014 1206   PROTEINUR NEGATIVE 09/20/2014 1206   UROBILINOGEN 0.2 09/20/2014 1206   NITRITE NEGATIVE 09/20/2014 1206   LEUKOCYTESUR TRACE (A) 09/20/2014 1206   Sepsis Labs Recent Labs  Lab 02/26/24 1134 02/27/24 0431 02/28/24 0441  02/29/24 0435  WBC 10.3 8.2 7.1  6.5   Microbiology Recent Results (from the past 240 hours)  Gastrointestinal Panel by PCR , Stool     Status: None   Collection Time: 02/26/24  1:21 PM   Specimen: STOOL  Result Value Ref Range Status   Campylobacter species NOT DETECTED NOT DETECTED Final   Plesimonas shigelloides NOT DETECTED NOT DETECTED Final   Salmonella species NOT DETECTED NOT DETECTED Final   Yersinia enterocolitica NOT DETECTED NOT DETECTED Final   Vibrio species NOT DETECTED NOT DETECTED Final   Vibrio cholerae NOT DETECTED NOT DETECTED Final   Enteroaggregative E coli (EAEC) NOT DETECTED NOT DETECTED Final   Enteropathogenic E coli (EPEC) NOT DETECTED NOT DETECTED Final   Enterotoxigenic E coli (ETEC) NOT DETECTED NOT DETECTED Final   Shiga like toxin producing E coli (STEC) NOT DETECTED NOT DETECTED Final   Shigella/Enteroinvasive E coli (EIEC) NOT DETECTED NOT DETECTED Final   Cryptosporidium NOT DETECTED NOT DETECTED Final   Cyclospora cayetanensis NOT DETECTED NOT DETECTED Final   Entamoeba histolytica NOT DETECTED NOT DETECTED Final   Giardia lamblia NOT DETECTED NOT DETECTED Final   Adenovirus F40/41 NOT DETECTED NOT DETECTED Final   Astrovirus NOT DETECTED NOT DETECTED Final   Norovirus GI/GII NOT DETECTED NOT DETECTED Final   Rotavirus A NOT DETECTED NOT DETECTED Final   Sapovirus (I, II, IV, and V) NOT DETECTED NOT DETECTED Final    Comment: Performed at [facility], [address] [place], [place] [address]     Time coordinating discharge: 35 minutes  SIGNED:   Cornelius Dill, DO [facility] 02/29/2024, 10:14 AM  If 7PM-7AM, please contact night-coverage [email or web address]

## 2024-02-29 NOTE — Care Management Important Message (Signed)
 Important Message  Patient Details  Name: [name] MRN: [id number] Date of Birth: 1961-09-22   Important Message Given:  Yes - Medicare IM     [name] [name] 02/29/2024, 11:20 AM

## 2024-02-29 NOTE — Progress Notes (Signed)
 Patient being discharged today.Not seen.   She needs to stay on dicyclomine and Zofran  as needed for nausea and abdominal pain.  Needs to use dicyclomine sparingly given her history of IBS with constipation.  She needs to also continue on her ursodiol  for her PBC.  She already has follow-up arranged with our office in 2 weeks on 5/29.   Julian Obey, MSN, APRN, FNP-BC, AGACNP-BC [facility] at [facility]

## 2024-02-29 NOTE — Plan of Care (Signed)

## 2024-02-29 NOTE — Progress Notes (Signed)
 Tolerated lunch well with no increased pain.  No bm since yesterday which was not bloody,  IV removed and DC instructions reviewed.  Scripts sent to pharmacy and to call primary for appt.  Waiting on husband for ride home

## 2024-03-04 ENCOUNTER — Encounter (INDEPENDENT_AMBULATORY_CARE_PROVIDER_SITE_OTHER): Payer: Self-pay

## 2024-03-05 NOTE — Telephone Encounter (Signed)
 Dr. Alita Irwin, please advise. Give patient another medication or give samples of Linzess  290?

## 2024-03-11 NOTE — Telephone Encounter (Signed)
 Hi yes [name] please provide patient Linzess  290 mcg samples

## 2024-03-11 NOTE — Telephone Encounter (Signed)
 Dr. Alita Irwin, please advise. Give patient another medication or give samples of Linzess  290?

## 2024-03-13 ENCOUNTER — Other Ambulatory Visit: Payer: Self-pay | Admitting: Cardiology

## 2024-03-13 ENCOUNTER — Ambulatory Visit (INDEPENDENT_AMBULATORY_CARE_PROVIDER_SITE_OTHER): Payer: Medicare Other | Admitting: Gastroenterology

## 2024-03-13 ENCOUNTER — Encounter: Payer: Self-pay | Admitting: *Deleted

## 2024-03-13 ENCOUNTER — Encounter (INDEPENDENT_AMBULATORY_CARE_PROVIDER_SITE_OTHER): Payer: Self-pay | Admitting: Gastroenterology

## 2024-03-13 VITALS — BP 115/75 | HR 88 | Temp 97.1°F | Ht 65.0 in | Wt 168.0 lb

## 2024-03-13 DIAGNOSIS — Z8719 Personal history of other diseases of the digestive system: Secondary | ICD-10-CM | POA: Insufficient documentation

## 2024-03-13 DIAGNOSIS — R1084 Generalized abdominal pain: Secondary | ICD-10-CM | POA: Insufficient documentation

## 2024-03-13 DIAGNOSIS — R197 Diarrhea, unspecified: Secondary | ICD-10-CM | POA: Diagnosis not present

## 2024-03-13 DIAGNOSIS — I10 Essential (primary) hypertension: Secondary | ICD-10-CM

## 2024-03-13 DIAGNOSIS — E639 Nutritional deficiency, unspecified: Secondary | ICD-10-CM

## 2024-03-13 DIAGNOSIS — R1030 Lower abdominal pain, unspecified: Secondary | ICD-10-CM

## 2024-03-13 DIAGNOSIS — R634 Abnormal weight loss: Secondary | ICD-10-CM | POA: Diagnosis not present

## 2024-03-13 MED ORDER — HYOSCYAMINE SULFATE 0.125 MG SL SUBL: 0.1250 mg | SUBLINGUAL_TABLET | Freq: Four times a day (QID) | SUBLINGUAL | 1 refills | Status: AC | PRN

## 2024-03-13 NOTE — Progress Notes (Signed)
 Referring Provider: Lory Rough., PA-C Primary Care Physician:  Lory Rough., PA-C Primary GI Physician: Dr. Alita Irwin   Chief Complaint  Patient presents with   Abdominal Pain    Patient here today due to having issues with lower abdominal pain and nausea. She has a decreased appetite and is afraid to eat due to the pain. She is taking Zofran  prn, but using sparingly as this causes constipation.  She is having a couple of looser stools per day. She is taking bentyl  qid.   HPI:   [name] is a 63 y.o. female with past medical history of epilepsy, COPD, CVA, rheumatoid arthritis on adalimumab, PBC on Urso , PFO  Patient presenting today for:  Hospital follow up of colitis (likely ischemic vs. Segmental)  -On 5/14, Patient presented to the ED with lower abdominal pain, loose bloody stools, constipation prior to onset  -CT A/P with contrast with mild colitis of splenic flexure and descending colon, no diverticulitis -Hospitalist consulted with Dr. Alita Irwin who recommended stool studies antibiotics and outpatient colonoscopy in 6-8 weeks as long as bleeding tapered off and hgb remained stable over the next 24 hours   -GI pathogen panel was negative  -Started on meropenem  1g q8h (given multiple allergy history) -last hgb yesterday was 14.8  -CMP yesterday with normal LFTs, Alk phos 93, T bili 0.8  Present:  Does not feel that pain has improved since admission. She states eating makes her abdominal pain significantly worse, pain starts about 30 minutes after eating, usually will have a BM shortly after but abdominal pain does not improve. No further rectal bleeding. Has tried eating softer, blander foods. Cannot consume cold liquids. Feels that she can only handle water/green tea for the most part without having pain. She has nausea. Is having more diarrhea when she goes to the bathroom. She had been on linzess  but previously ran out, was off of this maybe 3 days. She has a lot of  nausea but no vomiting. Trying to avoid taking zofran  as this makes her constipated.  She reports a lot of belching but no acid reflux. She is post cholecystectomy in the distant past.she is taking bentyl  QID without any improvement in pain.  She is on phentermine  but has had continued decline in weight, currently on her second month of this.   She states she was recommended to be on a heart healthy diet, that is soft, anti inflammatory, lactose free and no night shades. She has not met with a dietician before.   Last Colonoscopy:(Dr. Tova Fresh) 2021- Few scattered diverticula in the sigmoid colon.                           - The examined portion of the ileum was normal.                           - The examination was otherwise normal-random                            biopsies done  Past Medical History:  Diagnosis Date   Allergic rhinitis    uses Flonase  daily as needed and takes CLaritin  daily   Anxiety    takes Xanax  daily as needed   Asthma    Albuterol  inhaler prn;SYmbicort  daily   Cerebral vascular malformation    sees dr Larrie Po for monitoring as  needed, sees dr lewitt for headaches every 4 months   COPD (chronic obstructive pulmonary disease) (HCC)    Depression    takes Citalopram  daily   Diverticulitis at age 59   Eczema    Fibromyalgia    GERD (gastroesophageal reflux disease)    takes Protonix  daily   Hard of hearing    History of kidney stones    History of migraine    last one 10+yrs ago   History of staph infection 38yrs ago   Hyperlipidemia    takes Pravastatin  daily   IBS (irritable bowel syndrome)    mixed   Insomnia    Joint pain    Lactose intolerance    Nausea    takes Zofran  daily as needed   PFO (patent foramen ovale)    Plaque psoriasis    PONV (postoperative nausea and vomiting)    Postoperative anemia due to acute blood loss 02/08/2016   Primary localized osteoarthritis of left knee    Primary localized osteoarthritis of right knee 11/03/2015    Rheumatoid arthritis(714.0) 10/16/2008   oa and ra;Rhemicade IV every 6wks and Metotrexate weekly   Seizures (HCC) 6months ago 03/21/14   takes Depakote  daily   Shortness of breath    with exertion   Sleep apnea    study done >14yrs ago;uses CPAP nightly   Spinal headache    patient states that she thinks she had a spinal headache a long time ago   Stress incontinence    Stroke (HCC) 03/26/2013   left sided weakness   Thyroid  cyst     Past Surgical History:  Procedure Laterality Date   APPENDECTOMY  1981   BIOPSY  12/16/2019   Procedure: BIOPSY;  Surgeon: Tami Falcon, MD;  Location: [facility];  Service: Endoscopy;;   BIOPSY  07/19/2023   Procedure: BIOPSY;  Surgeon: Hargis Lias, MD;  [facility]: [facility];  Service: Endoscopy;;   CARDIAC CATHETERIZATION  2004   CHOLECYSTECTOMY  1981   COLONOSCOPY     COLONOSCOPY WITH PROPOFOL  N/A 12/16/2019   Procedure: COLONOSCOPY WITH PROPOFOL ;  Surgeon: Tami Falcon, MD;  Location: [facility];  Service: Endoscopy;  Laterality: N/A;   CORONARY PRESSURE/FFR STUDY N/A 07/25/2022   Procedure: INTRAVASCULAR PRESSURE WIRE/FFR STUDY;  Surgeon: Cody Das, MD;  [facility]: [facility];  Service: Cardiovascular;  Laterality: N/A;   ESOPHAGOGASTRODUODENOSCOPY     ESOPHAGOGASTRODUODENOSCOPY (EGD) WITH PROPOFOL  N/A 07/19/2023   Procedure: ESOPHAGOGASTRODUODENOSCOPY (EGD) WITH PROPOFOL ;  Surgeon: Hargis Lias, MD;  [facility]: [facility];  Service: Endoscopy;  Laterality: N/A;  12:45;asa 1-2, bumped to 1:00 for lunch to fit - messaged Tanya   FOOT SURGERY  1999   right ankle   HERNIA REPAIR  2006   x2   INCISIONAL HERNIA REPAIR  05/20/2012   Procedure: HERNIA REPAIR INCISIONAL;  Surgeon: Brandy Cal. Cornett, MD;  Location: [facility] ORS;  Service: General;  Laterality: N/A;   INCONTINENCE SURGERY  2010   sling done    KIDNEY STONE SURGERY  2001   KNEE ARTHROSCOPY  1992   left   left foot plating and scarping for arthritis  2011    LEFT HEART CATH AND CORONARY ANGIOGRAPHY N/A 07/25/2022   Procedure: LEFT HEART CATH AND CORONARY ANGIOGRAPHY;  Surgeon: Cody Das, MD;  [facility];  Service: Cardiovascular;  Laterality: N/A;   POLYPECTOMY  07/19/2023   Procedure: POLYPECTOMY;  Surgeon: Hargis Lias, MD;  [facility];  Service: Endoscopy;;   right ear tube insertion  2011   TEE WITHOUT CARDIOVERSION N/A 05/15/2013   Procedure: TRANSESOPHAGEAL ECHOCARDIOGRAM (TEE);  Surgeon: Jessica Morn, MD;  Location: [facility] ENDOSCOPY;  Service: Cardiovascular;  Laterality: N/A;   thryoid biopsy     TONSILLECTOMY AND ADENOIDECTOMY  04/01/2014   TONSILLECTOMY AND ADENOIDECTOMY Bilateral 04/01/2014   Procedure: BILATERAL TONSILLECTOMY AND ADENOIDECTOMY;  Surgeon: Lawence Press, MD;  Location: [facility];  Service: ENT;  Laterality: Bilateral;   TOTAL ABDOMINAL HYSTERECTOMY  2001   TOTAL KNEE ARTHROPLASTY Right 11/03/2015   TOTAL KNEE ARTHROPLASTY Right 11/03/2015   Procedure: RIGHT TOTAL KNEE ARTHROPLASTY/RIGHT;  Surgeon: Elly Habermann, MD;  [facility]: [facility] OR;  Service: Orthopedics;  Laterality: Right;   TOTAL KNEE ARTHROPLASTY Left 02/07/2016   Procedure: TOTAL KNEE ARTHROPLASTY;  Surgeon: Elly Habermann, MD;  [facility];  Service: Orthopedics;  Laterality: Left;   TUBAL LIGATION  1990    Current Outpatient Medications  Medication Sig Dispense Refill   albuterol  (VENTOLIN  HFA) 108 (90 Base) MCG/ACT inhaler Inhale 1-2 puffs into the lungs every 6 (six) hours as needed for wheezing or shortness of breath.     ALPRAZolam  (XANAX ) 0.5 MG tablet Take 0.5 mg by mouth at bedtime.     amLODipine  (NORVASC ) 5 MG tablet Take 1 tablet (5 mg total) by mouth daily. 60 tablet 0   aspirin  EC 81 MG tablet Take 81 mg by mouth at bedtime.     azelastine (OPTIVAR) 0.05 % ophthalmic solution Place 1 drop into both eyes 2 (two) times daily.     dicyclomine  (BENTYL ) 10 MG capsule Take 1 capsule (10 mg total) by mouth 4 (four)  times daily -  before meals and at bedtime. 120 capsule 0   ipratropium (ATROVENT ) 0.03 % nasal spray Place 2 sprays into the nose in the morning and at bedtime.     linaclotide  (LINZESS ) 145 MCG CAPS capsule Take 1 capsule (145 mcg total) by mouth daily. 90 capsule 1   nitroGLYCERIN  (NITROSTAT ) 0.4 MG SL tablet Place 1 tablet (0.4 mg total) under the tongue every 5 (five) minutes as needed for chest pain. 25 tablet 3   ondansetron  (ZOFRAN ) 4 MG tablet Take 1 tablet (4 mg total) by mouth daily as needed for nausea or vomiting. 30 tablet 1   OVER THE COUNTER MEDICATION Bio True eye drops daily.     pantoprazole  (PROTONIX ) 40 MG tablet TAKE 1 TABLET BY MOUTH DAILY 100 tablet 2   phentermine  15 MG capsule Take 15 mg by mouth daily.     pravastatin  (PRAVACHOL ) 40 MG tablet Take 40 mg by mouth daily.     PRESCRIPTION MEDICATION In Cholesterol study. Has injection once a month.has been in study for 3 years as of 12/07/23 and has 6 months to left in study. ( Will finish around end of August 2025 )     rizatriptan (MAXALT) 5 MG tablet Take 5 mg by mouth as needed for migraine. (not more than 3 a week). May repeat in 2 hours if needed     tiZANidine (ZANAFLEX) 4 MG tablet Take 1 tablet by mouth every 6 (six) hours as needed for muscle spasms.     topiramate  (TOPAMAX ) 50 MG tablet Take 50-100 mg by mouth 2 (two) times daily. 50 mg am and 100 mg pm     ursodiol  (ACTIGALL ) 300 MG capsule Take 1 capsule (300 mg total) by mouth 3 (three) times daily. 90 capsule 5   No current  facility-administered medications for this visit.    Allergies as of 03/13/2024 - Review Complete 03/13/2024  Allergen Reaction Noted   Aquacel [carboxymethylcellulose] Other (See Comments) 01/26/2016   Dextromethorphan Anaphylaxis 05/18/2021   Nickel Rash 01/26/2016   Sulfonamide derivatives Anaphylaxis, Hives, and Swelling 11/01/2009   Aspirin  Nausea And Vomiting    Ciprofloxacin Itching and Swelling 11/01/2009   Cosyntropin Hives  and Swelling 11/01/2009   Amoxicillin Nausea And Vomiting 11/01/2009   Diclofenac sodium Dermatitis 05/19/2019   Latex Rash 03/28/2016   Moxifloxacin Other (See Comments) 11/01/2009   Nsaids Other (See Comments) 05/18/2011   Tape Other (See Comments) and Dermatitis 01/25/2016    Social History   Socioeconomic History   Marital status: Married    Spouse name: Myrtie Atkinson   Number of children: 3   Years of education: [employer] education level: Not on file  Occupational History   Occupation: Economist    Comment: Chiropodist     Employer: [employer]  Tobacco Use   Smoking status: Former    Current packs/day: 0.00    Average packs/day: 2.0 packs/day for 30.0 years (60.0 ttl pk-yrs)    Types: Cigarettes    Start date: 10/19/1969    Quit date: 10/20/1999    Years since quitting: 24.4   Smokeless tobacco: Never  Vaping Use   Vaping status: Never Used  Substance and Sexual Activity   Alcohol use: Yes    Comment: occ   Drug use: No   Sexual activity: Yes  Other Topics Concern   Not on file  Social History Narrative   Patient is married with 3 children.   Patient is right handed.   Patient has college education.   Caffeine Use: 1.5 cup of coffee in a.m   Social Drivers of Health   Financial Resource Strain: Medium Risk (05/18/2021)   Received from [facility] visits prior to 12/16/2022., [facility] visits prior to 12/16/2022.   Overall Financial Resource Strain (CARDIA)    Difficulty of Paying Living Expenses: Somewhat hard  Food Insecurity: Low Risk  (03/03/2024)   [facility] from [facility]   Hunger Vital Sign    Worried About Running Out of Food in the Last Year: Never true    Ran Out of Food in the Last Year: Never true  Transportation Needs: No Transportation Needs (03/03/2024)   Received from [facility]    In the past 12 months, has lack of reliable transportation kept you from medical  appointments, meetings, work or from getting things needed for daily living? : No  Physical Activity: Inactive (05/18/2021)   Received from [facility] visits prior to 12/16/2022., [facility] visits prior to 12/16/2022.   Exercise Vital Sign    Days of Exercise per Week: 0 days    Minutes of Exercise per Session: 0 min  Stress: Stress Concern Present (05/18/2021)   Received from [facility] visits prior to 12/16/2022., [facility] visits prior to 12/16/2022.   [employer] of Occupational Health - Occupational Stress Questionnaire    Feeling of Stress : Very much  Social Connections: Socially Integrated (02/29/2024)   Social Connection and Isolation Panel [NHANES]    Frequency of Communication with Friends and Family: Three times a week    Frequency of Social Gatherings with Friends and Family: More than three times a week    Attends  Religious Services: More than 4 times per year    Active Member of Clubs or Organizations: Yes    Attends Banker Meetings: More than 4 times per year    Marital Status: Married    Review of systems General: negative for malaise, night sweats, fever, chills, weight los Neck: Negative for lumps, goiter, pain and significant neck swelling Resp: Negative for cough, wheezing, dyspnea at rest CV: Negative for chest pain, leg swelling, palpitations, orthopnea GI: denies melena, hematochezia, nausea, vomiting, constipation, dysphagia, odyonophagia, early satiety or unintentional weight loss. +abdominal pain +diarrhea  MSK: Negative for joint pain or swelling, back pain, and muscle pain. Derm: Negative for itching or rash Psych: Denies depression, anxiety, memory loss, confusion. No homicidal or suicidal ideation.  Heme: Negative for prolonged bleeding, bruising easily, and swollen nodes. Endocrine: Negative for cold or heat intolerance, polyuria, polydipsia and  goiter. Neuro: negative for tremor, gait imbalance, syncope and seizures. The remainder of the review of systems is noncontributory.  Physical Exam: BP 115/75 (BP Location: Right Arm, Patient Position: Sitting, Cuff Size: Normal)   Pulse 88   Temp (!) 97.1 F (36.2 C) (Temporal)   Ht 5\' 5"  (1.651 m)   Wt 168 lb (76.2 kg)   BMI 27.96 kg/m  General:   Alert and oriented. No distress noted. Pleasant and cooperative.  Head:  Normocephalic and atraumatic. Eyes:  Conjuctiva clear without scleral icterus. Mouth:  Oral mucosa pink and moist. Good dentition. No lesions. Heart: Normal rate and rhythm, s1 and s2 heart sounds present.  Lungs: Clear lung sounds in all lobes. Respirations equal and unlabored. Abdomen:  +BS, soft,and non-distended. Tender to palpation around umbilicus and lower abdomen. No rebound or guarding. No HSM or masses noted. Derm: No palmar erythema or jaundice Msk:  Symmetrical without gross deformities. Normal posture. Extremities:  Without edema. Neurologic:  Alert and  oriented x4 Psych:  Alert and cooperative. Normal mood and affect.  Invalid input(s): "6 MONTHS"   ASSESSMENT: DONATA REDDICK is a 63 y.o. female presenting today for hospital follow up of colitis with continued abdominal pain and diarrhea  Recent admission with rectal bleeding, diarrhea and abdominal pain, She was treated with meropenem  inpatient for concerns of ischemic vs. Segmental colitis. Notes no real improvement in symptoms other than resolution of bleeding. Having some diarrhea but on linzess . Continues to have significant post prandial pain, some weight loss though is on phentermine . At this point differentials include ongoing colitis, concern for mesenteric ischemia, given symptom worsening post prandially/weight loss. Recommend CT Angio A/P w wo contrast to rule out ischemia. She needs colonoscopy for reassessment in about 4-6 weeks.  Patient does note difficulty following multiple dietary  recommended needs, will refer her to nutrition as I think they may be able to play a vital role in helping her get her nutritional needs met.    PLAN:  -schedule colonoscopy (July) ASA II -CT Angio A/P w wo -referral to dietician -continue linzess  for now -lactose/dairy free diet - can try adding lactose/dairy free protein shakes for added nutrition   All questions were answered, patient verbalized understanding and is in agreement with plan as outlined above.   Follow Up: 3 months   Yaret Hush L. Tanise Russman, MSN, APRN, AGNP-C Adult-Gerontology Nurse Practitioner [facility]

## 2024-03-13 NOTE — Patient Instructions (Signed)
 You can stop bentyl , I have sent levsin to take in place of this for abdominal pain We will get you scheduled for a special CT to look at blood flow to the intestines We will schedule colonoscopy in July Continue linzess  for constipation I am referring you to dietician given multiple dietary needs You can try lactose/dairy free protein shakes to help with added nutrition as you are not able to tolerate much solid food at this time(some fairlife protein shakes are lactose free)  Follow up 3 months  It was a pleasure to see you today. I want to create trusting relationships with patients and provide genuine, compassionate, and quality care. I truly value your feedback! please be on the lookout for a survey regarding your visit with me today. I appreciate your input about our visit and your time in completing this!    Negar Sieler L. Keylie Beavers, MSN, APRN, AGNP-C Adult-Gerontology Nurse Practitioner [facility] at [facility]

## 2024-03-14 ENCOUNTER — Encounter: Payer: Self-pay | Admitting: *Deleted

## 2024-03-14 ENCOUNTER — Ambulatory Visit (HOSPITAL_COMMUNITY)
Admission: RE | Admit: 2024-03-14 | Discharge: 2024-03-14 | Disposition: A | Discharge status: Home / Self Care | Source: Ambulatory Visit | Attending: Gastroenterology | Admitting: Gastroenterology

## 2024-03-14 ENCOUNTER — Other Ambulatory Visit: Payer: Self-pay | Admitting: *Deleted

## 2024-03-14 DIAGNOSIS — R1084 Generalized abdominal pain: Secondary | ICD-10-CM | POA: Diagnosis present

## 2024-03-14 DIAGNOSIS — R634 Abnormal weight loss: Secondary | ICD-10-CM | POA: Diagnosis present

## 2024-03-14 MED ORDER — IOHEXOL 350 MG/ML SOLN
100.0000 mL | Freq: Once | INTRAVENOUS | Status: AC | PRN
  Administered 2024-03-14: 100 mL via INTRAVENOUS

## 2024-03-14 MED ORDER — NA SULFATE-K SULFATE-MG SULF 17.5-3.13-1.6 GM/177ML PO SOLN: ORAL | 0 refills | Status: DC

## 2024-03-17 ENCOUNTER — Ambulatory Visit (INDEPENDENT_AMBULATORY_CARE_PROVIDER_SITE_OTHER): Payer: Self-pay | Admitting: Gastroenterology

## 2024-03-17 ENCOUNTER — Other Ambulatory Visit (INDEPENDENT_AMBULATORY_CARE_PROVIDER_SITE_OTHER): Payer: Self-pay | Admitting: Gastroenterology

## 2024-03-17 MED ORDER — AMOXICILLIN-POT CLAVULANATE 875-125 MG PO TABS: 1.0000 | ORAL_TABLET | Freq: Two times a day (BID) | ORAL | 0 refills | Status: DC

## 2024-03-19 ENCOUNTER — Encounter (HOSPITAL_COMMUNITY): Payer: Self-pay

## 2024-03-19 ENCOUNTER — Emergency Department (HOSPITAL_COMMUNITY)

## 2024-03-19 ENCOUNTER — Inpatient Hospital Stay (HOSPITAL_COMMUNITY)
Admission: EM | Admit: 2024-03-19 | Discharge: 2024-03-24 | DRG: 392 | Disposition: A | Discharge status: Home / Self Care | Attending: Internal Medicine | Admitting: Internal Medicine

## 2024-03-19 ENCOUNTER — Other Ambulatory Visit: Payer: Self-pay

## 2024-03-19 DIAGNOSIS — R933 Abnormal findings on diagnostic imaging of other parts of digestive tract: Secondary | ICD-10-CM | POA: Diagnosis not present

## 2024-03-19 DIAGNOSIS — Z881 Allergy status to other antibiotic agents status: Secondary | ICD-10-CM

## 2024-03-19 DIAGNOSIS — Z96653 Presence of artificial knee joint, bilateral: Secondary | ICD-10-CM | POA: Diagnosis present

## 2024-03-19 DIAGNOSIS — Z8249 Family history of ischemic heart disease and other diseases of the circulatory system: Secondary | ICD-10-CM | POA: Diagnosis not present

## 2024-03-19 DIAGNOSIS — K449 Diaphragmatic hernia without obstruction or gangrene: Secondary | ICD-10-CM | POA: Diagnosis present

## 2024-03-19 DIAGNOSIS — Z91048 Other nonmedicinal substance allergy status: Secondary | ICD-10-CM

## 2024-03-19 DIAGNOSIS — K219 Gastro-esophageal reflux disease without esophagitis: Secondary | ICD-10-CM | POA: Diagnosis present

## 2024-03-19 DIAGNOSIS — E876 Hypokalemia: Secondary | ICD-10-CM | POA: Diagnosis present

## 2024-03-19 DIAGNOSIS — R1310 Dysphagia, unspecified: Secondary | ICD-10-CM | POA: Diagnosis present

## 2024-03-19 DIAGNOSIS — I10 Essential (primary) hypertension: Secondary | ICD-10-CM | POA: Diagnosis present

## 2024-03-19 DIAGNOSIS — R112 Nausea with vomiting, unspecified: Principal | ICD-10-CM | POA: Diagnosis present

## 2024-03-19 DIAGNOSIS — K743 Primary biliary cirrhosis: Secondary | ICD-10-CM | POA: Diagnosis present

## 2024-03-19 DIAGNOSIS — M069 Rheumatoid arthritis, unspecified: Secondary | ICD-10-CM | POA: Diagnosis present

## 2024-03-19 DIAGNOSIS — Z8619 Personal history of other infectious and parasitic diseases: Secondary | ICD-10-CM

## 2024-03-19 DIAGNOSIS — K5900 Constipation, unspecified: Secondary | ICD-10-CM | POA: Diagnosis not present

## 2024-03-19 DIAGNOSIS — Z833 Family history of diabetes mellitus: Secondary | ICD-10-CM

## 2024-03-19 DIAGNOSIS — R109 Unspecified abdominal pain: Secondary | ICD-10-CM

## 2024-03-19 DIAGNOSIS — Z8041 Family history of malignant neoplasm of ovary: Secondary | ICD-10-CM

## 2024-03-19 DIAGNOSIS — F32A Depression, unspecified: Secondary | ICD-10-CM | POA: Diagnosis present

## 2024-03-19 DIAGNOSIS — R1032 Left lower quadrant pain: Secondary | ICD-10-CM | POA: Diagnosis not present

## 2024-03-19 DIAGNOSIS — K2980 Duodenitis without bleeding: Secondary | ICD-10-CM | POA: Diagnosis not present

## 2024-03-19 DIAGNOSIS — Z5986 Financial insecurity: Secondary | ICD-10-CM

## 2024-03-19 DIAGNOSIS — Z8673 Personal history of transient ischemic attack (TIA), and cerebral infarction without residual deficits: Secondary | ICD-10-CM | POA: Diagnosis not present

## 2024-03-19 DIAGNOSIS — G40909 Epilepsy, unspecified, not intractable, without status epilepticus: Secondary | ICD-10-CM | POA: Diagnosis present

## 2024-03-19 DIAGNOSIS — Z91040 Latex allergy status: Secondary | ICD-10-CM

## 2024-03-19 DIAGNOSIS — Z880 Allergy status to penicillin: Secondary | ICD-10-CM

## 2024-03-19 DIAGNOSIS — Z882 Allergy status to sulfonamides status: Secondary | ICD-10-CM | POA: Diagnosis not present

## 2024-03-19 DIAGNOSIS — K5732 Diverticulitis of large intestine without perforation or abscess without bleeding: Secondary | ICD-10-CM | POA: Diagnosis present

## 2024-03-19 DIAGNOSIS — R935 Abnormal findings on diagnostic imaging of other abdominal regions, including retroperitoneum: Secondary | ICD-10-CM

## 2024-03-19 DIAGNOSIS — E872 Acidosis, unspecified: Secondary | ICD-10-CM | POA: Diagnosis present

## 2024-03-19 DIAGNOSIS — E785 Hyperlipidemia, unspecified: Secondary | ICD-10-CM | POA: Diagnosis present

## 2024-03-19 DIAGNOSIS — Z79899 Other long term (current) drug therapy: Secondary | ICD-10-CM

## 2024-03-19 DIAGNOSIS — J4489 Other specified chronic obstructive pulmonary disease: Secondary | ICD-10-CM | POA: Diagnosis present

## 2024-03-19 DIAGNOSIS — Z9109 Other allergy status, other than to drugs and biological substances: Secondary | ICD-10-CM

## 2024-03-19 DIAGNOSIS — G2581 Restless legs syndrome: Secondary | ICD-10-CM | POA: Diagnosis present

## 2024-03-19 DIAGNOSIS — K529 Noninfective gastroenteritis and colitis, unspecified: Secondary | ICD-10-CM | POA: Diagnosis present

## 2024-03-19 DIAGNOSIS — Z8261 Family history of arthritis: Secondary | ICD-10-CM

## 2024-03-19 DIAGNOSIS — Z90710 Acquired absence of both cervix and uterus: Secondary | ICD-10-CM

## 2024-03-19 DIAGNOSIS — F419 Anxiety disorder, unspecified: Secondary | ICD-10-CM | POA: Diagnosis present

## 2024-03-19 DIAGNOSIS — G894 Chronic pain syndrome: Secondary | ICD-10-CM | POA: Diagnosis present

## 2024-03-19 DIAGNOSIS — Z87891 Personal history of nicotine dependence: Secondary | ICD-10-CM

## 2024-03-19 DIAGNOSIS — E860 Dehydration: Secondary | ICD-10-CM

## 2024-03-19 DIAGNOSIS — Z87442 Personal history of urinary calculi: Secondary | ICD-10-CM

## 2024-03-19 DIAGNOSIS — K2950 Unspecified chronic gastritis without bleeding: Secondary | ICD-10-CM | POA: Diagnosis not present

## 2024-03-19 DIAGNOSIS — R197 Diarrhea, unspecified: Secondary | ICD-10-CM | POA: Diagnosis not present

## 2024-03-19 DIAGNOSIS — Z886 Allergy status to analgesic agent status: Secondary | ICD-10-CM

## 2024-03-19 DIAGNOSIS — M797 Fibromyalgia: Secondary | ICD-10-CM | POA: Diagnosis present

## 2024-03-19 DIAGNOSIS — Z803 Family history of malignant neoplasm of breast: Secondary | ICD-10-CM

## 2024-03-19 LAB — COMPREHENSIVE METABOLIC PANEL WITH GFR
ALT: 46 U/L — ABNORMAL HIGH (ref 0–44)
AST: 39 U/L (ref 15–41)
Albumin: 4.6 g/dL (ref 3.5–5.0)
Alkaline Phosphatase: 96 U/L (ref 38–126)
Anion gap: 13 (ref 5–15)
BUN: 8 mg/dL (ref 8–23)
CO2: 17 mmol/L — ABNORMAL LOW (ref 22–32)
Calcium: 9.8 mg/dL (ref 8.9–10.3)
Chloride: 109 mmol/L (ref 98–111)
Creatinine, Ser: 0.76 mg/dL (ref 0.44–1.00)
GFR, Estimated: >60 mL/min (ref >60)
Glucose, Bld: 189 mg/dL — ABNORMAL HIGH (ref 70–99)
Potassium: 2.5 mmol/L — CL (ref 3.5–5.1)
Sodium: 139 mmol/L (ref 135–145)
Total Bilirubin: 1.4 mg/dL — ABNORMAL HIGH (ref 0.0–1.2)
Total Protein: 8 g/dL (ref 6.5–8.1)

## 2024-03-19 LAB — CBC WITH DIFFERENTIAL/PLATELET
Abs Immature Granulocytes: 0.05 10*3/uL (ref 0.00–0.07)
Basophils Absolute: 0.1 10*3/uL (ref 0.0–0.1)
Basophils Relative: 0 %
Eosinophils Absolute: 0.1 10*3/uL (ref 0.0–0.5)
Eosinophils Relative: 1 %
HCT: 49.3 % — ABNORMAL HIGH (ref 36.0–46.0)
Hemoglobin: 17.4 g/dL — ABNORMAL HIGH (ref 12.0–15.0)
Immature Granulocytes: 0 %
Lymphocytes Relative: 12 %
Lymphs Abs: 1.9 10*3/uL (ref 0.7–4.0)
MCH: 32.5 pg (ref 26.0–34.0)
MCHC: 35.3 g/dL (ref 30.0–36.0)
MCV: 92.1 fL (ref 80.0–100.0)
Monocytes Absolute: 1 10*3/uL (ref 0.1–1.0)
Monocytes Relative: 6 %
Neutro Abs: 12.7 10*3/uL — ABNORMAL HIGH (ref 1.7–7.7)
Neutrophils Relative %: 81 %
Platelets: 320 10*3/uL (ref 150–400)
RBC: 5.35 MIL/uL — ABNORMAL HIGH (ref 3.87–5.11)
RDW: 12.8 % (ref 11.5–15.5)
WBC: 15.8 10*3/uL — ABNORMAL HIGH (ref 4.0–10.5)
nRBC: 0 % (ref 0.0–0.2)

## 2024-03-19 LAB — BASIC METABOLIC PANEL WITH GFR
Anion gap: 4 — ABNORMAL LOW (ref 5–15)
BUN: 9 mg/dL (ref 8–23)
CO2: 19 mmol/L — ABNORMAL LOW (ref 22–32)
Calcium: 7.8 mg/dL — ABNORMAL LOW (ref 8.9–10.3)
Chloride: 116 mmol/L — ABNORMAL HIGH (ref 98–111)
Creatinine, Ser: 0.51 mg/dL (ref 0.44–1.00)
GFR, Estimated: >60 mL/min (ref >60)
Glucose, Bld: 124 mg/dL — ABNORMAL HIGH (ref 70–99)
Potassium: 3.4 mmol/L — ABNORMAL LOW (ref 3.5–5.1)
Sodium: 139 mmol/L (ref 135–145)

## 2024-03-19 LAB — PHOSPHORUS: Phosphorus: 2.8 mg/dL (ref 2.5–4.6)

## 2024-03-19 LAB — LIPASE, BLOOD: Lipase: 30 U/L (ref 11–51)

## 2024-03-19 LAB — MAGNESIUM
Magnesium: 1.7 mg/dL (ref 1.7–2.4)
Magnesium: 2 mg/dL (ref 1.7–2.4)

## 2024-03-19 MED ORDER — PROCHLORPERAZINE EDISYLATE 10 MG/2ML IJ SOLN
10.0000 mg | Freq: Four times a day (QID) | INTRAMUSCULAR | Status: DC | PRN
  Administered 2024-03-19 – 2024-03-23 (×5): 10 mg via INTRAVENOUS
  Filled 2024-03-19 (×5): qty 2

## 2024-03-19 MED ORDER — HYDROMORPHONE HCL 1 MG/ML IJ SOLN
1.0000 mg | INTRAMUSCULAR | Status: DC | PRN
  Administered 2024-03-19 – 2024-03-23 (×7): 1 mg via INTRAVENOUS
  Filled 2024-03-19 (×8): qty 1

## 2024-03-19 MED ORDER — LINACLOTIDE 145 MCG PO CAPS
145.0000 ug | ORAL_CAPSULE | Freq: Every day | ORAL | Status: DC
  Administered 2024-03-19 – 2024-03-22 (×3): 145 ug via ORAL
  Filled 2024-03-19 (×4): qty 1

## 2024-03-19 MED ORDER — ALPRAZOLAM 0.5 MG PO TABS
0.5000 mg | ORAL_TABLET | Freq: Every evening | ORAL | Status: DC | PRN
  Administered 2024-03-19 – 2024-03-23 (×4): 0.5 mg via ORAL
  Filled 2024-03-19 (×4): qty 1

## 2024-03-19 MED ORDER — PANTOPRAZOLE SODIUM 40 MG PO TBEC
40.0000 mg | DELAYED_RELEASE_TABLET | Freq: Two times a day (BID) | ORAL | Status: DC
  Administered 2024-03-19 – 2024-03-24 (×11): 40 mg via ORAL
  Filled 2024-03-19 (×11): qty 1

## 2024-03-19 MED ORDER — POTASSIUM CHLORIDE 10 MEQ/100ML IV SOLN
10.0000 meq | INTRAVENOUS | Status: AC
  Administered 2024-03-19 (×3): 10 meq via INTRAVENOUS
  Filled 2024-03-19 (×3): qty 100

## 2024-03-19 MED ORDER — AMLODIPINE BESYLATE 5 MG PO TABS
5.0000 mg | ORAL_TABLET | Freq: Every day | ORAL | Status: DC
  Administered 2024-03-19 – 2024-03-24 (×6): 5 mg via ORAL
  Filled 2024-03-19 (×6): qty 1

## 2024-03-19 MED ORDER — ENOXAPARIN SODIUM 40 MG/0.4ML IJ SOSY
40.0000 mg | PREFILLED_SYRINGE | INTRAMUSCULAR | Status: DC
  Administered 2024-03-19: 40 mg via SUBCUTANEOUS
  Filled 2024-03-19 (×2): qty 0.4

## 2024-03-19 MED ORDER — MAGNESIUM SULFATE 2 GM/50ML IV SOLN
2.0000 g | Freq: Once | INTRAVENOUS | Status: AC
  Administered 2024-03-19: 2 g via INTRAVENOUS
  Filled 2024-03-19: qty 50

## 2024-03-19 MED ORDER — ACETAMINOPHEN 650 MG RE SUPP: 650.0000 mg | Freq: Four times a day (QID) | RECTAL | Status: DC | PRN

## 2024-03-19 MED ORDER — PRAVASTATIN SODIUM 40 MG PO TABS
40.0000 mg | ORAL_TABLET | Freq: Every day | ORAL | Status: DC
  Administered 2024-03-19 – 2024-03-23 (×5): 40 mg via ORAL
  Filled 2024-03-19 (×6): qty 1

## 2024-03-19 MED ORDER — KETOTIFEN FUMARATE 0.035 % OP SOLN: 1.0000 [drp] | Freq: Two times a day (BID) | OPHTHALMIC | Status: DC

## 2024-03-19 MED ORDER — ACETAMINOPHEN 325 MG PO TABS
650.0000 mg | ORAL_TABLET | Freq: Four times a day (QID) | ORAL | Status: DC | PRN
Start: 2024-03-19 — End: 2024-03-24

## 2024-03-19 MED ORDER — IPRATROPIUM BROMIDE 0.06 % NA SOLN
2.0000 | Freq: Two times a day (BID) | NASAL | Status: DC
  Administered 2024-03-20 – 2024-03-24 (×6): 2 via NASAL
  Filled 2024-03-19: qty 30

## 2024-03-19 MED ORDER — FENTANYL CITRATE (PF) 100 MCG/2ML IJ SOLN
100.0000 ug | Freq: Once | INTRAMUSCULAR | Status: AC
  Administered 2024-03-19: 100 ug via INTRAVENOUS
  Filled 2024-03-19: qty 2

## 2024-03-19 MED ORDER — METOCLOPRAMIDE HCL 5 MG/ML IJ SOLN
10.0000 mg | Freq: Once | INTRAMUSCULAR | Status: AC
  Administered 2024-03-19: 10 mg via INTRAVENOUS
  Filled 2024-03-19: qty 2

## 2024-03-19 MED ORDER — DIPHENHYDRAMINE HCL 50 MG/ML IJ SOLN
25.0000 mg | Freq: Once | INTRAMUSCULAR | Status: AC
  Administered 2024-03-19: 25 mg via INTRAVENOUS
  Filled 2024-03-19: qty 1

## 2024-03-19 MED ORDER — SODIUM CHLORIDE 0.9 % IV BOLUS
1000.0000 mL | Freq: Once | INTRAVENOUS | Status: AC
  Administered 2024-03-19: 1000 mL via INTRAVENOUS

## 2024-03-19 MED ORDER — BOOST / RESOURCE BREEZE PO LIQD CUSTOM
1.0000 | Freq: Three times a day (TID) | ORAL | Status: DC
  Administered 2024-03-21 – 2024-03-24 (×7): 1 via ORAL

## 2024-03-19 MED ORDER — PROCHLORPERAZINE EDISYLATE 10 MG/2ML IJ SOLN
10.0000 mg | Freq: Once | INTRAMUSCULAR | Status: AC
  Administered 2024-03-19: 10 mg via INTRAVENOUS
  Filled 2024-03-19: qty 2

## 2024-03-19 MED ORDER — SODIUM CHLORIDE 0.9 % IV SOLN
INTRAVENOUS | Status: AC
Start: 2024-03-19 — End: 2024-03-20

## 2024-03-19 MED ORDER — TOPIRAMATE 25 MG PO TABS
50.0000 mg | ORAL_TABLET | Freq: Every day | ORAL | Status: DC
  Administered 2024-03-19 – 2024-03-24 (×6): 50 mg via ORAL
  Filled 2024-03-19 (×6): qty 2

## 2024-03-19 MED ORDER — TOPIRAMATE 100 MG PO TABS
100.0000 mg | ORAL_TABLET | Freq: Every day | ORAL | Status: DC
  Administered 2024-03-19 – 2024-03-23 (×5): 100 mg via ORAL
  Filled 2024-03-19 (×5): qty 1

## 2024-03-19 MED ORDER — ORAL CARE MOUTH RINSE: 15.0000 mL | OROMUCOSAL | Status: DC | PRN

## 2024-03-19 MED ORDER — POTASSIUM CHLORIDE CRYS ER 20 MEQ PO TBCR
40.0000 meq | EXTENDED_RELEASE_TABLET | Freq: Once | ORAL | Status: AC
  Administered 2024-03-19: 40 meq via ORAL
  Filled 2024-03-19: qty 2

## 2024-03-19 MED ORDER — METOCLOPRAMIDE HCL 5 MG/ML IJ SOLN
5.0000 mg | Freq: Three times a day (TID) | INTRAMUSCULAR | Status: DC | PRN
  Administered 2024-03-19 – 2024-03-20 (×2): 5 mg via INTRAVENOUS
  Filled 2024-03-19 (×2): qty 2

## 2024-03-19 NOTE — ED Notes (Signed)
 Pt given water for PO challenge, pt took a few sips and stated "it isn't sitting right, I don't want to drink anymore." States feeling nauseous after additional meds given. No further episodes of emesis noted. Dr Wallis Gun notified.

## 2024-03-19 NOTE — ED Provider Notes (Signed)
 [place] [facility] AT [facility] Provider Note   CSN: [id number] Arrival date & time: 03/19/24  0001     History  Chief Complaint  Patient presents with   Abdominal Pain    [name] is a 63 y.o. female.  The history is provided by the patient and the spouse.   Patient presents for recurrent vomiting and abdominal pain.  Patient reports she has had these episodes for several weeks.  Over the past day her nausea vomiting and abdominal pain have worsened.  She reports nonbloody emesis, and no bloody stools.  No diarrhea.  No chest pain or shortness of breath.  She was recently admitted for colitis and discharged on May 16 however her symptoms have never really improved.  She has since seen gastroenterology and had a CT scan as an outpatient and scheduled for colonoscopy in July She has had multiple surgeries before including appendectomy and cholecystectomy    Home Medications Prior to Admission medications   Medication Sig Start Date End Date Taking? Authorizing Provider  albuterol  (VENTOLIN  HFA) 108 (90 Base) MCG/ACT inhaler Inhale 1-2 puffs into the lungs every 6 (six) hours as needed for wheezing or shortness of breath.    [provider]  ALPRAZolam  (XANAX ) 0.5 MG tablet Take 0.5 mg by mouth at bedtime. 12/29/20   [provider]  amLODipine  (NORVASC ) 5 MG tablet TAKE 1 TABLET BY MOUTH DAILY 03/13/24   Knox Perl, MD  amoxicillin-clavulanate (AUGMENTIN) 875-125 MG tablet Take 1 tablet by mouth 2 (two) times daily for 10 days. 03/17/24 03/27/24  Patterson Bora, NP  aspirin  EC 81 MG tablet Take 81 mg by mouth at bedtime.    [provider]  azelastine (OPTIVAR) 0.05 % ophthalmic solution Place 1 drop into both eyes 2 (two) times daily.    [provider]  dicyclomine  (BENTYL ) 10 MG capsule Take 1 capsule (10 mg total) by mouth 4 (four) times daily -  before meals and at bedtime. 02/29/24 03/30/24  Mason Sole, Pratik D, DO   hyoscyamine  (LEVSIN SL) 0.125 MG SL tablet Place 1 tablet (0.125 mg total) under the tongue every 6 (six) hours as needed. 03/13/24   Carlan, Chelsea L, NP  ipratropium (ATROVENT ) 0.03 % nasal spray Place 2 sprays into the nose in the morning and at bedtime. 05/18/21   [provider]  linaclotide  (LINZESS ) 145 MCG CAPS capsule Take 1 capsule (145 mcg total) by mouth daily. 10/23/23 04/20/24  Hargis Lias, MD  Na Sulfate-K Sulfate-Mg Sulfate concentrate (SUPREP) 17.5-3.13-1.6 GM/177ML SOLN As directed 03/14/24   Ahmed, Muhammad F, MD  nitroGLYCERIN  (NITROSTAT ) 0.4 MG SL tablet Place 1 tablet (0.4 mg total) under the tongue every 5 (five) minutes as needed for chest pain. 03/13/23 03/13/24  Knox Perl, MD  ondansetron  (ZOFRAN ) 4 MG tablet Take 1 tablet (4 mg total) by mouth daily as needed for nausea or vomiting. 02/29/24 02/28/25  Mason Sole, Pratik D, DO  OVER THE COUNTER MEDICATION Bio True eye drops daily.    [provider]  pantoprazole  (PROTONIX ) 40 MG tablet TAKE 1 TABLET BY MOUTH DAILY 02/06/24   Ahmed, Forbes Ida, MD  phentermine  15 MG capsule Take 15 mg by mouth daily.    [provider]  pravastatin  (PRAVACHOL ) 40 MG tablet Take 40 mg by mouth daily.    [provider]  PRESCRIPTION MEDICATION In Cholesterol study. Has injection once a month.has been in study for 3 years as of 12/07/23 and has  6 months to left in study. ( Will finish around end of August 2025 )    [provider]  rizatriptan (MAXALT) 5 MG tablet Take 5 mg by mouth as needed for migraine. (not more than 3 a week). May repeat in 2 hours if needed 12/06/23 12/05/24  [provider]  tiZANidine (ZANAFLEX) 4 MG tablet Take 1 tablet by mouth every 6 (six) hours as needed for muscle spasms. 10/05/23   [provider]  topiramate  (TOPAMAX ) 50 MG tablet Take 50-100 mg by mouth 2 (two) times daily. 50 mg am and 100 mg pm 10/19/22   [provider]  ursodiol  (ACTIGALL ) 300 MG  capsule Take 1 capsule (300 mg total) by mouth 3 (three) times daily. 12/07/23   Hargis Lias, MD      Allergies    Aquacel [carboxymethylcellulose], Dextromethorphan, Nickel, Sulfonamide derivatives, Aspirin , Ciprofloxacin, Cosyntropin, Amoxicillin, Diclofenac sodium, Latex, Moxifloxacin, Nsaids, and Tape    Review of Systems   Review of Systems  Cardiovascular:  Negative for chest pain.  Gastrointestinal:  Positive for abdominal pain, nausea and vomiting. Negative for blood in stool.    Physical Exam Updated Vital Signs BP 122/80   Pulse 71   Temp 98 F (36.7 C) (Oral)   Resp 17   Ht 1.651 m (5\' 5" )   Wt 76.2 kg   SpO2 96%   BMI 27.96 kg/m  Physical Exam CONSTITUTIONAL: Elderly, actively vomiting, distress noted HEAD: Normocephalic/atraumatic EYES: EOMI/PERRL, no icterus ENMT: Mucous membranes moist NECK: supple no meningeal signs CV: S1/S2 noted, no murmurs/rubs/gallops noted LUNGS: Lungs are clear to auscultation bilaterally, no apparent distress ABDOMEN: soft, diffuse moderate tenderness, no rebound or guarding, scattered bowel sounds noted throughout abdomen NEURO: Pt is awake/alert/appropriate, moves all extremitiesx4.  No facial droop.   EXTREMITIES: pulses normal/equal, full ROM SKIN: warm, color normal PSYCH: Anxious  ED Results / Procedures / Treatments   Labs (all labs ordered are listed, but only abnormal results are displayed) Labs Reviewed  COMPREHENSIVE METABOLIC PANEL WITH GFR - Abnormal; Notable for the following components:      Result Value   Potassium 2.5 (*)    CO2 17 (*)    Glucose, Bld 189 (*)    ALT 46 (*)    Total Bilirubin 1.4 (*)    All other components within normal limits  CBC WITH DIFFERENTIAL/PLATELET - Abnormal; Notable for the following components:   WBC 15.8 (*)    RBC 5.35 (*)    Hemoglobin 17.4 (*)    HCT 49.3 (*)    Neutro Abs 12.7 (*)    All other components within normal limits  LIPASE, BLOOD  MAGNESIUM   BASIC  METABOLIC PANEL WITH GFR    EKG EKG Interpretation Date/Time:  Wednesday March 19 2024 00:34:16 EDT Ventricular Rate:  99 PR Interval:  161 QRS Duration:  84 QT Interval:  411 QTC Calculation: 528 R Axis:   -44  Text Interpretation: Sinus rhythm Left axis deviation Abnormal R-wave progression, early transition Prolonged QT interval Confirmed by Eldon Greenland ([id number]) on 03/19/2024 12:43:28 AM   EKG Interpretation Date/Time:  Wednesday March 19 2024 06:31:52 EDT Ventricular Rate:  71 PR Interval:  159 QRS Duration:  75 QT Interval:  498 QTC Calculation: 542 R Axis:   -31  Text Interpretation: Sinus rhythm Left axis deviation Low voltage, precordial leads Abnormal R-wave progression, early transition Borderline abnrm T, anterolateral leads QT is improved on this EKG Confirmed by Eldon Greenland [id number]) on 03/19/2024 6:34:05  AM       REPEAT EKG REVEALS IMPROVED QT   Radiology DG Abdomen Acute W/Chest Result Date: 03/19/2024 CLINICAL DATA:  Abdominal pain, vomiting EXAM: DG ABDOMEN ACUTE WITH 1 VIEW CHEST COMPARISON:  None Available. FINDINGS: There is no evidence of dilated bowel loops or free intraperitoneal air. Umbilical hernia repair with mesh has been performed. No radiopaque calculi or other significant radiographic abnormality is seen. Heart size and mediastinal contours are within normal limits. Both lungs are clear. IMPRESSION: 1. Negative abdominal radiographs. No acute cardiopulmonary disease. Electronically Signed   By: Worthy Heads M.D.   On: 03/19/2024 00:57    Procedures .Critical Care  Performed by: Eldon Greenland, MD Authorized by: Eldon Greenland, MD   Critical care provider statement:    Critical care time (minutes):  60   Critical care start time:  03/19/2024 12:30 AM   Critical care end time:  03/19/2024 1:30 AM   Critical care time was exclusive of:  Separately billable procedures and treating other patients   Critical care was necessary to treat or  prevent imminent or life-threatening deterioration of the following conditions:  Metabolic crisis and dehydration   Critical care was time spent personally by me on the following activities:  Obtaining history from patient or surrogate, examination of patient, re-evaluation of patient's condition, pulse oximetry, ordering and review of radiographic studies, ordering and review of laboratory studies, ordering and performing treatments and interventions, review of old charts, development of treatment plan with patient or surrogate and evaluation of patient's response to treatment   I assumed direction of critical care for this patient from another provider in my specialty: no     Care discussed with: admitting provider   Comments:     Patient has received several rounds of IV potassium, IV magnesium  and IV fluids, patient was checked on frequently     Medications Ordered in ED Medications  sodium chloride  0.9 % bolus 1,000 mL (0 mLs Intravenous Stopped 03/19/24 0145)  prochlorperazine  (COMPAZINE ) injection 10 mg (10 mg Intravenous Given 03/19/24 0049)  fentaNYL  (SUBLIMAZE ) injection 100 mcg (100 mcg Intravenous Given 03/19/24 0139)  potassium chloride  10 mEq in 100 mL IVPB (0 mEq Intravenous Stopped 03/19/24 0443)  magnesium  sulfate IVPB 2 g 50 mL (0 g Intravenous Stopped 03/19/24 0237)  metoCLOPramide  (REGLAN ) injection 10 mg (10 mg Intravenous Given 03/19/24 0508)  diphenhydrAMINE  (BENADRYL ) injection 25 mg (25 mg Intravenous Given 03/19/24 0507)  sodium chloride  0.9 % bolus 1,000 mL (1,000 mLs Intravenous New Bag/Given 03/19/24 2956)    ED Course/ Medical Decision Making/ A&P Clinical Course as of 03/19/24 0629  Wed Mar 19, 2024  0103 Patient with complicated history of the past month which included admission for colitis that improved, but still had pain and vomiting.  She has since seen gastroenterology who did a CT angio abdomen as an outpatient did not reveal any signs of ischemic colitis Patient  reports he never really improved after the admission Patient appears uncomfortable with vomiting Will start with acute abdominal series, check labs and reassess. Will start with Compazine  for nausea control [DW]  0103 WBC(!): 15.8 Leukocytosis [DW]  0132 Potassium(!!): 2.5 Hypokalemia [DW]  0133 Patient reports feeling some improvement after Compazine .  Her x-ray is negative.  She reports this pain is similar to previous episodes.  Will defer CT imaging for now unless her pain escalates or changes in nature.  Patient was found to be dehydrated and hypokalemic.  Magnesium  is slightly low as well.  Will replenish potassium and magnesium  but if she has any other vomiting she will likely need admission [DW]  0228 Patient continues to improve.  She is resting comfortably.  No focal abdominal tenderness, no abdominal distention She reports she has had a bowel movement in the previous 24 hours She is not actively vomiting.  At this point we will continue to defer CT imaging, low suspicion for acute abdominal emergency  Plan to continue replenishing electrolytes [DW]  0601 Patient reports continued nausea despite multiple medications.  She is unable to drink fluids.  She has failed multiple treatment options.  Patient will be admitted for rehydration and replenishment of her electrolytes.  On reassessment she has no focal abdominal tenderness, no distention Will defer any further imaging and call for admission [DW]    Clinical Course User Index [DW] Eldon Greenland, MD                                 Medical Decision Making Amount and/or Complexity of Data Reviewed Labs: ordered. Decision-making details documented in ED Course. Radiology: ordered. ECG/medicine tests: ordered.  Risk Prescription drug management. Decision regarding hospitalization.   This patient presents to the ED for concern of abdominal pain, this involves an extensive number of treatment options, and is a complaint that  carries with it a high risk of complications and morbidity.  The differential diagnosis includes but is not limited to cholecystitis, , pancreatitis, gastritis, peptic ulcer disease, , bowel obstruction, bowel perforation, diverticulitis, AAA, ischemic bowel, acute coronary syndrome    Comorbidities that complicate the patient evaluation: Patient's presentation is complicated by their history of fibromyalgia, CVA, IBS  Social Determinants of Health: Patient's former tobacco use  increases the complexity of managing their presentation  Additional history obtained: Additional history obtained from spouse Records reviewed previous admission documents and GI notes  Lab Tests: I Ordered, and personally interpreted labs.  The pertinent results include: Dehydration, hemoconcentration, hypokalemia  Imaging Studies ordered: I ordered imaging studies including X-ray acute abdominal series  I independently visualized and interpreted imaging which showed no acute obstructive process I agree with the radiologist interpretation  Cardiac Monitoring: The patient was maintained on a cardiac monitor.  I personally viewed and interpreted the cardiac monitor which showed an underlying rhythm of:  sinus rhythm  Medicines ordered and prescription drug management: I ordered medication including Compazine  for nausea Reevaluation of the patient after these medicines showed that the patient    stayed the same   Critical Interventions:   IV fluids, IV potassium  Consultations Obtained: I requested consultation with the admitting physician [facility], and discussed  findings as well as pertinent plan - they recommend: Admit  Reevaluation: After the interventions noted above, I reevaluated the patient and found that they have :stayed the same  Complexity of problems addressed: Patient's presentation is most consistent with  acute presentation with potential threat to life or bodily  function  Disposition: After consideration of the diagnostic results and the patient's response to treatment,  I feel that the patent would benefit from admission  .           Final Clinical Impression(s) / ED Diagnoses Final diagnoses:  Nausea and vomiting, unspecified vomiting type  Dehydration  Hypokalemia    Rx / DC Orders ED Discharge Orders     None         Eldon Greenland, MD 03/19/24 [id number]

## 2024-03-19 NOTE — ED Notes (Signed)
 ED Provider at bedside.

## 2024-03-19 NOTE — ED Notes (Signed)
 Pt assisted to bathroom to void x standby assist. Pt experienced episode of emesis after getting back into bed. MD aware. Urine specimen collected and at pt bedside.

## 2024-03-19 NOTE — ED Triage Notes (Signed)
 Pt to ED from home with abd pain, nausea, vomiting, x 3 weeks, pt has had recent admission on 02/26/24 for colitis.

## 2024-03-19 NOTE — H&P (Signed)
 History and Physical    Patient: [name]:[id number] DOB: 27-Dec-1960 DOA: 03/19/2024 DOS: the patient was seen and examined on 03/19/2024 PCP: Lory Rough., PA-C   Patient coming from: Abdominal pain, nausea and vomiting  Chief Complaint:  Chief Complaint  Patient presents with   Abdominal Pain   HPI: [name] is a 63 y.o. female with medical history significant of asthma/COPD, depression, chronic pain syndrome, GERD, hyperlipidemia, restless leg syndrome, rheumatoid arthritis, history of CVA, seizure disorder, IBS with chronic constipation, restless leg syndrome and history of primary biliary cirrhosis who presented to the emergency department complaining of lower abdominal pain with associated nausea and vomiting.  Patient expressed difficulty keeping things down and maintaining adequate hydration.  There has not been fever, bloody emesis, chest pain, shortness of breath, focal weaknesses or sick contacts.  Of note, patient with recent hospitalization in mid May for similar symptoms where she was found to have diverticulitis and after being treated with antibiotics was discharged home with improvement in her symptoms and instruction to follow-up with gastroenterology as an outpatient.  Patient expressed that her symptoms has continue waxing and waning and definitely worse in the last 48 hours prior to admission.  In the ED basic metabolic panel demonstrated metabolic acidosis, hypokalemia and ALT of 46; white blood cells 15.8, hemoglobin 17.4 and platelet count 320K.  Magnesium  1.7 and lipase within normal limits.  Fluid resuscitation initiated, GI service consulted and [facility] contacted to place patient in the hospital for further evaluation and management.    Review of Systems: As mentioned in the history of present illness. All other systems reviewed and are negative. Past Medical History:  Diagnosis Date   Allergic rhinitis    uses Flonase  daily as needed and takes  CLaritin  daily   Anxiety    takes Xanax  daily as needed   Asthma    Albuterol  inhaler prn;SYmbicort  daily   Cerebral vascular malformation    sees dr Larrie Po for monitoring as needed, sees dr lewitt for headaches every 4 months   COPD (chronic obstructive pulmonary disease) (HCC)    Depression    takes Citalopram  daily   Diverticulitis at age 10   Eczema    Fibromyalgia    GERD (gastroesophageal reflux disease)    takes Protonix  daily   Hard of hearing    History of kidney stones    History of migraine    last one 10+yrs ago   History of staph infection 36yrs ago   Hyperlipidemia    takes Pravastatin  daily   IBS (irritable bowel syndrome)    mixed   Insomnia    Joint pain    Lactose intolerance    Nausea    takes Zofran  daily as needed   PFO (patent foramen ovale)    Plaque psoriasis    PONV (postoperative nausea and vomiting)    Postoperative anemia due to acute blood loss 02/08/2016   Primary localized osteoarthritis of left knee    Primary localized osteoarthritis of right knee 11/03/2015   Rheumatoid arthritis(714.0) 10/16/2008   oa and ra;Rhemicade IV every 6wks and Metotrexate weekly   Seizures (HCC) 6months ago 03/21/14   takes Depakote  daily   Shortness of breath    with exertion   Sleep apnea    study done >46yrs ago;uses CPAP nightly   Spinal headache    patient states that she thinks she had a spinal headache a long time ago   Stress incontinence    Stroke (  HCC) 03/26/2013   left sided weakness   Thyroid  cyst    Past Surgical History:  Procedure Laterality Date   APPENDECTOMY  1981   BIOPSY  12/16/2019   Procedure: BIOPSY;  Surgeon: Tami Falcon, MD;  Location: [facility];  Service: Endoscopy;;   BIOPSY  07/19/2023   Procedure: BIOPSY;  Surgeon: Hargis Lias, MD;  Location: [facility];  Service: Endoscopy;;   CARDIAC CATHETERIZATION  2004   CHOLECYSTECTOMY  1981   COLONOSCOPY     COLONOSCOPY WITH PROPOFOL  N/A 12/16/2019   Procedure: COLONOSCOPY  WITH PROPOFOL ;  Surgeon: Tami Falcon, MD;  Location: [facility];  Service: Endoscopy;  Laterality: N/A;   CORONARY PRESSURE/FFR STUDY N/A 07/25/2022   Procedure: INTRAVASCULAR PRESSURE WIRE/FFR STUDY;  Surgeon: Cody Das, MD;  Location: [facility];  Service: Cardiovascular;  Laterality: N/A;   ESOPHAGOGASTRODUODENOSCOPY     ESOPHAGOGASTRODUODENOSCOPY (EGD) WITH PROPOFOL  N/A 07/19/2023   Procedure: ESOPHAGOGASTRODUODENOSCOPY (EGD) WITH PROPOFOL ;  Surgeon: Hargis Lias, MD;  Location: [facility];  Service: Endoscopy;  Laterality: N/A;  12:45;asa 1-2, bumped to 1:00 for lunch to fit - messaged Tanya   FOOT SURGERY  1999   right ankle   HERNIA REPAIR  2006   x2   INCISIONAL HERNIA REPAIR  05/20/2012   Procedure: HERNIA REPAIR INCISIONAL;  Surgeon: Brandy Cal. Cornett, MD;  Location: [facility];  Service: General;  Laterality: N/A;   INCONTINENCE SURGERY  2010   sling done    KIDNEY STONE SURGERY  2001   KNEE ARTHROSCOPY  1992   left   left foot plating and scarping for arthritis  2011   LEFT HEART CATH AND CORONARY ANGIOGRAPHY N/A 07/25/2022   Procedure: LEFT HEART CATH AND CORONARY ANGIOGRAPHY;  Surgeon: Cody Das, MD;  Location: [facility];  Service: Cardiovascular;  Laterality: N/A;   POLYPECTOMY  07/19/2023   Procedure: POLYPECTOMY;  Surgeon: Hargis Lias, MD;  Location: [facility];  Service: Endoscopy;;   right ear tube insertion  2011   TEE WITHOUT CARDIOVERSION N/A 05/15/2013   Procedure: TRANSESOPHAGEAL ECHOCARDIOGRAM (TEE);  Surgeon: Jessica Morn, MD;  Location: [facility] ENDOSCOPY;  Service: Cardiovascular;  Laterality: N/A;   thryoid biopsy     TONSILLECTOMY AND ADENOIDECTOMY  04/01/2014   TONSILLECTOMY AND ADENOIDECTOMY Bilateral 04/01/2014   Procedure: BILATERAL TONSILLECTOMY AND ADENOIDECTOMY;  Surgeon: Lawence Press, MD;  Location: [facility] OR;  Service: ENT;  Laterality: Bilateral;   TOTAL ABDOMINAL HYSTERECTOMY  2001   TOTAL KNEE  ARTHROPLASTY Right 11/03/2015   TOTAL KNEE ARTHROPLASTY Right 11/03/2015   Procedure: RIGHT TOTAL KNEE ARTHROPLASTY/RIGHT;  Surgeon: Elly Habermann, MD;  Location: [facility] OR;  Service: Orthopedics;  Laterality: Right;   TOTAL KNEE ARTHROPLASTY Left 02/07/2016   Procedure: TOTAL KNEE ARTHROPLASTY;  Surgeon: Elly Habermann, MD;  Location: [facility] OR;  Service: Orthopedics;  Laterality: Left;   TUBAL LIGATION  1990   Social History:  reports that she quit smoking about 24 years ago. Her smoking use included cigarettes. She started smoking about 54 years ago. She has a 60 pack-year smoking history. She has never used smokeless tobacco. She reports current alcohol use. She reports that she does not use drugs.  Allergies  Allergen Reactions   Aquacel [Carboxymethylcellulose] Other (See Comments)    Blisters    Dextromethorphan Anaphylaxis   Nickel Rash    Turn read   Sulfonamide Derivatives Anaphylaxis, Hives and Swelling    Swelling of tongue   Aspirin   Nausea And Vomiting    Severe GI upset.  Pt states she can tolerate 81 mg asa only.    Ciprofloxacin Itching and Swelling    Angioedema, urticaria   Cosyntropin Hives and Swelling   Amoxicillin Nausea And Vomiting    GI upset   Diclofenac Sodium Dermatitis    Gel causes legs to break out   Latex Rash    Blisters   Moxifloxacin Other (See Comments)    GI upset   Nsaids Other (See Comments)    Gi upset   Tape Other (See Comments) and Dermatitis    Blisters skin    Family History  Problem Relation Age of Onset   Ovarian cancer Mother    Diabetes Maternal Grandmother    Arthritis Maternal Grandmother    Diabetes Brother    Diabetes Paternal Grandmother    Diabetes Maternal Grandfather    Diabetes Paternal Grandfather    Heart disease Brother    Heart disease Other        Uncle   Breast cancer Maternal Aunt     Prior to Admission medications   Medication Sig Start Date End Date Taking? Authorizing Provider  leflunomide (ARAVA) 10 MG  tablet Take 1 tablet by mouth daily. 03/07/24  Yes [provider]  albuterol  (VENTOLIN  HFA) 108 (90 Base) MCG/ACT inhaler Inhale 1-2 puffs into the lungs every 6 (six) hours as needed for wheezing or shortness of breath.    [provider]  ALPRAZolam  (XANAX ) 0.5 MG tablet Take 0.5 mg by mouth at bedtime. 12/29/20   [provider]  amLODipine  (NORVASC ) 5 MG tablet TAKE 1 TABLET BY MOUTH DAILY 03/13/24   Knox Perl, MD  amoxicillin-clavulanate (AUGMENTIN) 875-125 MG tablet Take 1 tablet by mouth 2 (two) times daily for 10 days. 03/17/24 03/27/24  Carlan, Fidel Huddle, NP  aspirin  EC 81 MG tablet Take 81 mg by mouth at bedtime.    [provider]  azelastine (OPTIVAR) 0.05 % ophthalmic solution Place 1 drop into both eyes 2 (two) times daily.    [provider]  dicyclomine  (BENTYL ) 10 MG capsule Take 1 capsule (10 mg total) by mouth 4 (four) times daily -  before meals and at bedtime. 02/29/24 03/30/24  Mason Sole, Pratik D, DO  hyoscyamine  (LEVSIN SL) 0.125 MG SL tablet Place 1 tablet (0.125 mg total) under the tongue every 6 (six) hours as needed. 03/13/24   Carlan, Chelsea L, NP  ipratropium (ATROVENT ) 0.03 % nasal spray Place 2 sprays into the nose in the morning and at bedtime. 05/18/21   [provider]  linaclotide  (LINZESS ) 145 MCG CAPS capsule Take 1 capsule (145 mcg total) by mouth daily. 10/23/23 04/20/24  Hargis Lias, MD  Na Sulfate-K Sulfate-Mg Sulfate concentrate (SUPREP) 17.5-3.13-1.6 GM/177ML SOLN As directed 03/14/24   Ahmed, Muhammad F, MD  nitroGLYCERIN  (NITROSTAT ) 0.4 MG SL tablet Place 1 tablet (0.4 mg total) under the tongue every 5 (five) minutes as needed for chest pain. 03/13/23 03/13/24  Knox Perl, MD  ondansetron  (ZOFRAN ) 4 MG tablet Take 1 tablet (4 mg total) by mouth daily as needed for nausea or vomiting. 02/29/24 02/28/25  Mason Sole, Pratik D, DO  OVER THE COUNTER MEDICATION Bio True eye drops daily.    [provider]  pantoprazole   (PROTONIX ) 40 MG tablet TAKE 1 TABLET BY MOUTH DAILY 02/06/24   Ahmed, Forbes Ida, MD  phentermine  15 MG capsule Take 15 mg by mouth daily.    [provider]  pravastatin  (  PRAVACHOL ) 40 MG tablet Take 40 mg by mouth daily.    [provider]  PRESCRIPTION MEDICATION In Cholesterol study. Has injection once a month.has been in study for 3 years as of 12/07/23 and has 6 months to left in study. ( Will finish around end of August 2025 )    [provider]  rizatriptan (MAXALT) 5 MG tablet Take 5 mg by mouth as needed for migraine. (not more than 3 a week). May repeat in 2 hours if needed 12/06/23 12/05/24  [provider]  tiZANidine (ZANAFLEX) 4 MG tablet Take 1 tablet by mouth every 6 (six) hours as needed for muscle spasms. 10/05/23   [provider]  topiramate  (TOPAMAX ) 50 MG tablet Take 50-100 mg by mouth 2 (two) times daily. 50 mg am and 100 mg pm 10/19/22   [provider]  ursodiol  (ACTIGALL ) 300 MG capsule Take 1 capsule (300 mg total) by mouth 3 (three) times daily. 12/07/23   Hargis Lias, MD    Physical Exam: Vitals:   03/19/24 0630 03/19/24 0730 03/19/24 0840 03/19/24 1220  BP: 133/75 124/72 (!) 146/82 129/74  Pulse: 70 75 65 71  Resp: 18 19  15   Temp:   97.8 F (36.6 C) 98.2 F (36.8 C)  TempSrc:   Oral Oral  SpO2: 97% 96% 100%   Weight:      Height:       General exam: Alert, awake, oriented x 3; afebrile, expressing being nauseated. Respiratory system: Clear to auscultation. Respiratory effort normal.  Good saturation on room air. Cardiovascular system:RRR. No rubs or gallops. Gastrointestinal system: Abdomen is nondistended, soft and demonstrating vague diffuse mid and lower abdominal discomfort on palpation.  No guarding.  Positive bowel sounds. Central nervous system: No focal neurological deficits. Extremities: No C/C/E, +pedal pulses Skin: No petechiae. Psychiatry: Judgement and insight appear normal. Mood & affect  appropriate.   Data Reviewed: As per info reported on history of present illness.  Assessment and Plan: 1-abdominal pain, nausea/vomiting - Continue bowel rest allowing clear liquid diets at the moment - As needed analgesics and antiemetics will be provided -Continue fluid resuscitation and hold on antibiotics for now. - GI service have been consulted and will follow recommendations.  2-hypokalemia - In the setting of GI losses and decreased oral intake - Magnesium  within normal limit - Continue replating potassium and follow trend - Telemetry monitoring has been ordered  3-GERD - Continue PPI  4-hypertension - Continue home antihypertensive agents - Follow vital signs.  5-anxiety/depression - Continue home anxiolytics and antidepressant regimen - No suicidal ideation hallucination currently appreciated.  6-hyperlipidemia - Continue statin  7-chronic pain syndrome/fibromyalgia - Continue home analgesic therapy  8-chronic headaches/seizure - No epileptic activity currently appreciated - Continue Topamax  - Continue as needed Tylenol   9-hx of RA - Continue outpatient follow-up with rheumatology service and treatment with adalimumab.  10-hx of PBC - Continue Urso   11-leukocytosis - In the setting of a stress demargination - Holding on antibiotics at the moment - Providing fluid resuscitation - Follow WBCs trend and continue supportive care.    Advance Care Planning:   Code Status: Full Code   Consults: Gastroenterology service  Family Communication: No family at bedside  Severity of Illness: The appropriate patient status for this patient is INPATIENT. Inpatient status is judged to be reasonable and necessary in order to provide the required intensity of service to ensure the patient's safety. The patient's presenting symptoms, physical exam findings, and initial radiographic  and laboratory data in the context of their chronic comorbidities is felt to place  them at high risk for further clinical deterioration. Furthermore, it is not anticipated that the patient will be medically stable for discharge from the hospital within 2 midnights of admission.   * I certify that at the point of admission it is my clinical judgment that the patient will require inpatient hospital care spanning beyond 2 midnights from the point of admission due to high intensity of service, high risk for further deterioration and high frequency of surveillance required.*  Author: Justina Oman, MD 03/19/2024 12:46 PM  For on call review [email or web address].

## 2024-03-19 NOTE — Progress Notes (Signed)
   03/19/24 2137  TOC Brief Assessment  Insurance and Status Reviewed  Patient has primary care physician Yes  Home environment has been reviewed From home  Prior level of function: Min assist  Prior/Current Home Services No current home services  Social Drivers of Health Review SDOH reviewed no interventions necessary  Readmission risk has been reviewed Yes  Transition of care needs no transition of care needs at this time   Pt c/several autoimmune disorders and >10 daily medications listed in chart. Pt is compliant c/follow-up appts.   Transition of Care Department [facility]) has reviewed patient and no TOC needs have been identified at this time. We will continue to monitor patient advancement through interdisciplinary progression rounds. If new patient transition needs arise, please place a TOC consult.

## 2024-03-19 NOTE — Consult Note (Addendum)
 Gastroenterology Consult   Referring Provider: No ref. provider found Primary Care Physician:  Lory Rough., PA-C Primary Gastroenterologist:  Dr.Ahmed   Patient ID: [name]; [id number]; 1961/08/09   Admit date: 03/19/2024  LOS: 0 days   Date of Consultation: 03/19/2024  Reason for Consultation:  abdominal pain, colitis   History of Present Illness   [name] is a 63 y.o. year old female with past medical history of epilepsy, COPD, CVA, rheumatoid arthritis on adalimumab, PBC on Urso , PFO with recent admission earlier this month with abdominal pain, rectal bleeding, CT concerning for colitis, treated with IV meropenem , recent outpatient CT angio A/P on 5/30 without findings to suggest mesenteric ischemia but ongoing colitis, prescribed augmentin, who presented to the ED late last night with nausea, vomiting and worsening abdominal pain. GI consulted for further evaluation  ED Course: Abd xray negative Potassium 2.5, (improved to 3.4) ALT 46, t bili 1.4  calcium  7.8 WBC 15.8, hgb 17.4   Consult:  Patient well known to me, seen in GI clinic on 5/29 with ongoing abdominal pain, worse postprandially, some diarrhea though taking linzess , no rectal bleeding.CT Angio ordered due to concern for possible ischemia which showed ongoign colitis, mild narrowing of origin of superior mesenteric artery with less than 50% stenosis, vessels patent, mild mucosal hyperenhancement of descending, sigmoid colon and rectum, consistent with nonspecific colitis, prescribed course of augmentin due to significant allergy list.   Patient reports she started augmentin last night as she did not have a car to get the meds before then. She ate and within about 1 hour of meds she had nausea and vomiting despite taking zofran . Pain became worse due to vomiting and she was unable to keep anything down which prompted her to come to the ED. Nausea still ongoing, receiving compazine  and reglan  though still  not able to tolerate much in regards to POs. Denies any recent rectal bleeding. Having about 3 watery stools per day on linzess  but reports she cannot defecate when she does not take it, despite mostly looser stools.     Last TCS: (Dr. Tova Fresh) 2021- Few scattered diverticula in the sigmoid colon.                           - The examined portion of the ileum was normal.                           - The examination was otherwise normal-random                            biopsies done.  A. COLON, RANDOM, BIOPSY: - Benign colonic mucosa. - No active inflammation or evidence of microscopic colitis. - No dysplasia or malignancy.   Past Medical History:  Diagnosis Date   Allergic rhinitis    uses Flonase  daily as needed and takes CLaritin  daily   Anxiety    takes Xanax  daily as needed   Asthma    Albuterol  inhaler prn;SYmbicort  daily   Cerebral vascular malformation    sees dr Larrie Po for monitoring as needed, sees dr lewitt for headaches every 4 months   COPD (chronic obstructive pulmonary disease) (HCC)    Depression    takes Citalopram  daily   Diverticulitis at age 45   Eczema    Fibromyalgia    GERD (gastroesophageal reflux disease)  takes Protonix  daily   Hard of hearing    History of kidney stones    History of migraine    last one 10+yrs ago   History of staph infection 62yrs ago   Hyperlipidemia    takes Pravastatin  daily   IBS (irritable bowel syndrome)    mixed   Insomnia    Joint pain    Lactose intolerance    Nausea    takes Zofran  daily as needed   PFO (patent foramen ovale)    Plaque psoriasis    PONV (postoperative nausea and vomiting)    Postoperative anemia due to acute blood loss 02/08/2016   Primary localized osteoarthritis of left knee    Primary localized osteoarthritis of right knee 11/03/2015   Rheumatoid arthritis(714.0) 10/16/2008   oa and ra;Rhemicade IV every 6wks and Metotrexate weekly   Seizures (HCC) 6months ago 03/21/14   takes Depakote  daily    Shortness of breath    with exertion   Sleep apnea    study done >68yrs ago;uses CPAP nightly   Spinal headache    patient states that she thinks she had a spinal headache a long time ago   Stress incontinence    Stroke (HCC) 03/26/2013   left sided weakness   Thyroid  cyst     Past Surgical History:  Procedure Laterality Date   APPENDECTOMY  1981   BIOPSY  12/16/2019   Procedure: BIOPSY;  Surgeon: Tami Falcon, MD;  Location: [facility];  Service: Endoscopy;;   BIOPSY  07/19/2023   Procedure: BIOPSY;  Surgeon: Hargis Lias, MD;  Location: [facility];  Service: Endoscopy;;   CARDIAC CATHETERIZATION  2004   CHOLECYSTECTOMY  1981   COLONOSCOPY     COLONOSCOPY WITH PROPOFOL  N/A 12/16/2019   Procedure: COLONOSCOPY WITH PROPOFOL ;  Surgeon: Tami Falcon, MD;  Location: [facility];  Service: Endoscopy;  Laterality: N/A;   CORONARY PRESSURE/FFR STUDY N/A 07/25/2022   Procedure: INTRAVASCULAR PRESSURE WIRE/FFR STUDY;  Surgeon: Cody Das, MD;  Location: [facility];  Service: Cardiovascular;  Laterality: N/A;   ESOPHAGOGASTRODUODENOSCOPY     ESOPHAGOGASTRODUODENOSCOPY (EGD) WITH PROPOFOL  N/A 07/19/2023   Procedure: ESOPHAGOGASTRODUODENOSCOPY (EGD) WITH PROPOFOL ;  Surgeon: Hargis Lias, MD;  Location: [facility];  Service: Endoscopy;  Laterality: N/A;  12:45;asa 1-2, bumped to 1:00 for lunch to fit - messaged Tanya   FOOT SURGERY  1999   right ankle   HERNIA REPAIR  2006   x2   INCISIONAL HERNIA REPAIR  05/20/2012   Procedure: HERNIA REPAIR INCISIONAL;  Surgeon: Brandy Cal. Cornett, MD;  Location: [facility];  Service: General;  Laterality: N/A;   INCONTINENCE SURGERY  2010   sling done    KIDNEY STONE SURGERY  2001   KNEE ARTHROSCOPY  1992   left   left foot plating and scarping for arthritis  2011   LEFT HEART CATH AND CORONARY ANGIOGRAPHY N/A 07/25/2022   Procedure: LEFT HEART CATH AND CORONARY ANGIOGRAPHY;  Surgeon: Cody Das, MD;  [facility]: [facility];  Service: Cardiovascular;  Laterality: N/A;   POLYPECTOMY  07/19/2023   Procedure: POLYPECTOMY;  Surgeon: Hargis Lias, MD;  [facility];  Service: Endoscopy;;   right ear tube insertion  2011   TEE WITHOUT CARDIOVERSION N/A 05/15/2013   Procedure: TRANSESOPHAGEAL ECHOCARDIOGRAM (TEE);  Surgeon: Jessica Morn, MD;  Location: [facility] ENDOSCOPY;  Service: Cardiovascular;  Laterality: N/A;   thryoid biopsy     TONSILLECTOMY AND  ADENOIDECTOMY  04/01/2014   TONSILLECTOMY AND ADENOIDECTOMY Bilateral 04/01/2014   Procedure: BILATERAL TONSILLECTOMY AND ADENOIDECTOMY;  Surgeon: Lawence Press, MD;  Location: [facility];  Service: ENT;  Laterality: Bilateral;   TOTAL ABDOMINAL HYSTERECTOMY  2001   TOTAL KNEE ARTHROPLASTY Right 11/03/2015   TOTAL KNEE ARTHROPLASTY Right 11/03/2015   Procedure: RIGHT TOTAL KNEE ARTHROPLASTY/RIGHT;  Surgeon: Elly Habermann, MD;  Location: [facility] OR;  Service: Orthopedics;  Laterality: Right;   TOTAL KNEE ARTHROPLASTY Left 02/07/2016   Procedure: TOTAL KNEE ARTHROPLASTY;  Surgeon: Elly Habermann, MD;  Location: [facility] OR;  Service: Orthopedics;  Laterality: Left;   TUBAL LIGATION  1990    Prior to Admission medications   Medication Sig Start Date End Date Taking? Authorizing Provider  leflunomide (ARAVA) 10 MG tablet Take 1 tablet by mouth daily. 03/07/24  Yes [provider]  albuterol  (VENTOLIN  HFA) 108 (90 Base) MCG/ACT inhaler Inhale 1-2 puffs into the lungs every 6 (six) hours as needed for wheezing or shortness of breath.    [provider]  ALPRAZolam  (XANAX ) 0.5 MG tablet Take 0.5 mg by mouth at bedtime. 12/29/20   [provider]  amLODipine  (NORVASC ) 5 MG tablet TAKE 1 TABLET BY MOUTH DAILY 03/13/24   Knox Perl, MD  amoxicillin-clavulanate (AUGMENTIN) 875-125 MG tablet Take 1 tablet by mouth 2 (two) times daily for 10 days. 03/17/24 03/27/24  Kailena Lubas, Fidel Huddle, NP  aspirin  EC 81 MG tablet Take 81 mg by mouth at bedtime.     [provider]  azelastine (OPTIVAR) 0.05 % ophthalmic solution Place 1 drop into both eyes 2 (two) times daily.    [provider]  dicyclomine  (BENTYL ) 10 MG capsule Take 1 capsule (10 mg total) by mouth 4 (four) times daily -  before meals and at bedtime. 02/29/24 03/30/24  Mason Sole, Pratik D, DO  hyoscyamine  (LEVSIN SL) 0.125 MG SL tablet Place 1 tablet (0.125 mg total) under the tongue every 6 (six) hours as needed. 03/13/24   Ann-Marie Kluge L, NP  ipratropium (ATROVENT ) 0.03 % nasal spray Place 2 sprays into the nose in the morning and at bedtime. 05/18/21   [provider]  linaclotide  (LINZESS ) 145 MCG CAPS capsule Take 1 capsule (145 mcg total) by mouth daily. 10/23/23 04/20/24  Hargis Lias, MD  Na Sulfate-K Sulfate-Mg Sulfate concentrate (SUPREP) 17.5-3.13-1.6 GM/177ML SOLN As directed 03/14/24   Ahmed, Muhammad F, MD  nitroGLYCERIN  (NITROSTAT ) 0.4 MG SL tablet Place 1 tablet (0.4 mg total) under the tongue every 5 (five) minutes as needed for chest pain. 03/13/23 03/13/24  Knox Perl, MD  ondansetron  (ZOFRAN ) 4 MG tablet Take 1 tablet (4 mg total) by mouth daily as needed for nausea or vomiting. 02/29/24 02/28/25  Mason Sole, Pratik D, DO  OVER THE COUNTER MEDICATION Bio True eye drops daily.    [provider]  pantoprazole  (PROTONIX ) 40 MG tablet TAKE 1 TABLET BY MOUTH DAILY 02/06/24   Ahmed, Forbes Ida, MD  phentermine  15 MG capsule Take 15 mg by mouth daily.    [provider]  pravastatin  (PRAVACHOL ) 40 MG tablet Take 40 mg by mouth daily.    [provider]  PRESCRIPTION MEDICATION In Cholesterol study. Has injection once a month.has been in study for 3 years as of 12/07/23 and has 6 months to left in study. ( Will finish around end of August 2025 )    [provider]  rizatriptan (MAXALT) 5 MG tablet Take 5 mg by mouth as needed for migraine. (  not more than 3 a week). May repeat in 2 hours if needed 12/06/23 12/05/24  [provider]   tiZANidine (ZANAFLEX) 4 MG tablet Take 1 tablet by mouth every 6 (six) hours as needed for muscle spasms. 10/05/23   [provider]  topiramate  (TOPAMAX ) 50 MG tablet Take 50-100 mg by mouth 2 (two) times daily. 50 mg am and 100 mg pm 10/19/22   [provider]  ursodiol  (ACTIGALL ) 300 MG capsule Take 1 capsule (300 mg total) by mouth 3 (three) times daily. 12/07/23   Hargis Lias, MD    Current Facility-Administered Medications  Medication Dose Route Frequency Provider Last Rate Last Admin   0.9 %  sodium chloride  infusion   Intravenous Continuous Justina Oman, MD       acetaminophen  (TYLENOL ) tablet 650 mg  650 mg Oral Q6H PRN Justina Oman, MD       Or   acetaminophen  (TYLENOL ) suppository 650 mg  650 mg Rectal Q6H PRN Justina Oman, MD       ALPRAZolam  (XANAX ) tablet 0.5 mg  0.5 mg Oral QHS PRN Justina Oman, MD       amLODipine  (NORVASC ) tablet 5 mg  5 mg Oral Daily Justina Oman, MD       enoxaparin  (LOVENOX ) injection 40 mg  40 mg Subcutaneous Q24H Justina Oman, MD       HYDROmorphone  (DILAUDID ) injection 1 mg  1 mg Intravenous Q3H PRN Justina Oman, MD       ipratropium (ATROVENT ) 0.03 % nasal spray 2 spray  2 spray Nasal BID Justina Oman, MD       ketotifen  (ZADITOR ) 0.035 % ophthalmic solution 1 drop  1 drop Both Eyes BID Justina Oman, MD       linaclotide  (LINZESS ) capsule 145 mcg  145 mcg Oral Daily Justina Oman, MD       metoCLOPramide  (REGLAN ) injection 5 mg  5 mg Intravenous Q8H PRN Justina Oman, MD       pantoprazole  (PROTONIX ) EC tablet 40 mg  40 mg Oral BID Justina Oman, MD       potassium chloride  SA (KLOR-CON  M) CR tablet 40 mEq  40 mEq Oral Once Mad[place], Carlos, MD       pravastatin  (PRAVACHOL ) tablet 40 mg  40 mg Oral q1800 Justina Oman, MD       prochlorperazine  (COMPAZINE ) injection 10 mg  10 mg Intravenous Q6H PRN Justina Oman, MD       topiramate  (TOPAMAX ) tablet 50-100 mg  50-100 mg Oral BID Justina Oman, MD         Allergies as of 03/19/2024 - Review Complete 03/19/2024  Allergen Reaction Noted   Aquacel [carboxymethylcellulose] Other (See Comments) 01/26/2016   Dextromethorphan Anaphylaxis 05/18/2021   Nickel Rash 01/26/2016   Sulfonamide derivatives Anaphylaxis, Hives, and Swelling 11/01/2009   Aspirin  Nausea And Vomiting    Ciprofloxacin Itching and Swelling 11/01/2009   Cosyntropin Hives and Swelling 11/01/2009   Amoxicillin Nausea And Vomiting 11/01/2009   Diclofenac sodium Dermatitis 05/19/2019   Latex Rash 03/28/2016   Moxifloxacin Other (See Comments) 11/01/2009   Nsaids Other (See Comments) 05/18/2011   Tape Other (See Comments) and Dermatitis 01/25/2016    Family History  Problem Relation Age of Onset   Ovarian cancer Mother    Diabetes Maternal Grandmother    Arthritis Maternal Grandmother    Diabetes Brother    Diabetes Paternal Grandmother    Diabetes Maternal Grandfather    Diabetes Paternal Grandfather    Heart  disease Brother    Heart disease Other        Uncle   Breast cancer Maternal Aunt     Social History   Socioeconomic History   Marital status: Married    Spouse name: Myrtie Atkinson   Number of children: 3   Years of education: College   Highest education level: Not on file  Occupational History   Occupation: Economist    Comment: Chiropodist     Employer: [employer]  Tobacco Use   Smoking status: Former    Current packs/day: 0.00    Average packs/day: 2.0 packs/day for 30.0 years (60.0 ttl pk-yrs)    Types: Cigarettes    Start date: 10/19/1969    Quit date: 10/20/1999    Years since quitting: 24.4   Smokeless tobacco: Never  Vaping Use   Vaping status: Never Used  Substance and Sexual Activity   Alcohol use: Yes    Comment: occ   Drug use: No   Sexual activity: Yes  Other Topics Concern   Not on file  Social History Narrative   Patient is married with 3 children.   Patient is right handed.   Patient has college education.    Caffeine Use: 1.5 cup of coffee in a.m   Social Drivers of Health   Financial Resource Strain: Medium Risk (05/18/2021)   [facility] prior to 12/16/2022., [facility] visits prior to 12/16/2022.   Overall Financial Resource Strain (CARDIA)    Difficulty of Paying Living Expenses: Somewhat hard  Food Insecurity: Low Risk  (03/03/2024)   [facility]   Hunger Vital Sign    Worried About Running Out of Food in the Last Year: Never true    Ran Out of Food in the Last Year: Never true  Transportation Needs: No Transportation Needs (03/03/2024)   Received from [employer]    In the past 12 months, has lack of reliable transportation kept you from medical appointments, meetings, work or from getting things needed for daily living? : No  Physical Activity: Inactive (05/18/2021)   [facility] visits prior to 12/16/2022., [facility] visits prior to 12/16/2022.   Exercise Vital Sign    Days of Exercise per Week: 0 days    Minutes of Exercise per Session: 0 min  Stress: Stress Concern Present (05/18/2021)   Received from [facility] visits prior to 12/16/2022., [facility] visits prior to 12/16/2022.   [employer] of Occupational Health - Occupational Stress Questionnaire    Feeling of Stress : Very much  Social Connections: Socially Integrated (02/29/2024)   Social Connection and Isolation Panel [NHANES]    Frequency of Communication with Friends and Family: Three times a week    Frequency of Social Gatherings with Friends and Family: More than three times a week    Attends Religious Services: More than 4 times per year    Active Member of [employer] or Organizations: Yes    Attends Banker Meetings: More than 4 times per year    Marital Status: Married  Catering manager Violence: Not At Risk  (02/26/2024)   Humiliation, Afraid, Rape, and Kick questionnaire    Fear of Current or Ex-Partner: No    Emotionally Abused: No    Physically Abused: No    Sexually Abused: No     Review  of Systems   Gen: Denies any fever, chills, loss of appetite, change in weight or weight loss CV: Denies chest pain, heart palpitations, syncope, edema  Resp: Denies shortness of breath with rest, cough, wheezing, coughing up blood, and pleurisy. GI: +abdominal pain +looser stools +nausea +vomiting  GU : Denies urinary burning, blood in urine, urinary frequency, and urinary incontinence. MS: Denies joint pain, limitation of movement, swelling, cramps, and atrophy.  Derm: Denies rash, itching, dry skin, hives. Psych: Denies depression, anxiety, memory loss, hallucinations, and confusion. Heme: Denies bruising or bleeding Neuro:  Denies any headaches, dizziness, paresthesias, shaking  Physical Exam   Vital Signs in last 24 hours: Temp:  [97.8 F (36.6 C)-98 F (36.7 C)] 97.8 F (36.6 C) (06/04 0840) Pulse Rate:  [65-103] 65 (06/04 0840) Resp:  [15-20] 19 (06/04 0730) BP: (112-146)/(66-102) 146/82 (06/04 0840) SpO2:  [90 %-100 %] 100 % (06/04 0840) Weight:  [76.2 kg] 76.2 kg (06/04 0018)    General:   Alert,  Well-developed, well-nourished, pleasant and cooperative in NAD Head:  Normocephalic and atraumatic. Eyes:  Sclera clear, no icterus.   Conjunctiva pink. Ears:  Normal auditory acuity. Mouth:  No deformity or lesions, dentition normal. Neck:  Supple; no masses Lungs:  Clear throughout to auscultation.   No wheezes, crackles, or rhonchi. No acute distress. Heart:  Regular rate and rhythm; no murmurs, clicks, rubs,  or gallops. Abdomen:  Soft, and nondistended. TTP of diffuse lower abdomen. No masses, hepatosplenomegaly or hernias noted. Normal bowel sounds, without guarding, and without rebound.   Msk:  Symmetrical without gross deformities. Normal posture. Extremities:  Without clubbing  or edema. Neurologic:  Alert and  oriented x4. Skin:  Intact without significant lesions or rashes. Psych:  Alert and cooperative. Normal mood and affect.  Intake/Output from previous day: 06/03 0701 - 06/04 0700 In: -  Out: 300 [Urine:300] Intake/Output this shift: No intake/output data recorded.  @WEIGHTS @  Labs/Studies   Recent Labs Recent Labs    03/19/24 0029  WBC 15.8*  HGB 17.4*  HCT 49.3*  PLT 320   BMET Recent Labs    03/19/24 0029 03/19/24 0612  NA 139 139  K 2.5* 3.4*  CL 109 116*  CO2 17* 19*  GLUCOSE 189* 124*  BUN 8 9  CREATININE 0.76 0.51  CALCIUM  9.8 7.8*   LFT Recent Labs    03/19/24 0029  PROT 8.0  ALBUMIN 4.6  AST 39  ALT 46*  ALKPHOS 96  BILITOT 1.4*     Assessment   [name] is a 63 y.o. year old female with past medical history of epilepsy, COPD, CVA, rheumatoid arthritis on adalimumab, PBC on Urso , PFO with recent admission earlier this month with abdominal pain, rectal bleeding, CT concerning for colitis, treated with IV meropenem , recent outpatient CT angio A/P on 5/30 without findings to suggest mesenteric ischemia but ongoing colitis, prescribed augmentin, who presented to the ED late last night with nausea, vomiting and worsening abdominal pain. GI consulted for further evaluation  Abdominal pain, nausea, vomiting, colitis: initial colitis diagnosis earlier this month, treated with meropenem  IV and discharged. She was seen in outpatient clinic with no noted improvement in pain but resolution of rectal bleeding. CT Angio as above with some narrowing of SMA but no occlusion, hyperenhancement of colon and rectum concerning for ongoign colitis, prescribed 10 days of augmentin and scheduled for follow up colonoscopy on 7/7  She presented to the ED last night with ongoing pain and intractable  nausea and vomiting after taking first dose of augmentin (notably has history of nausea and vomiting secondary to augmentin) she got no  relief from zofran .  Seems to have some mild improvement in nausea with reglan  and compazine  though continues to have some nausea and abdominal pain has persisted.  Etiology of her colitis is unclear, low suspicion for infectious etiology, will hold off on antibiotics for now especially given her nausea and vomiting. Pending clinical course,may consider EGD tmrw if nausea/vomiting persists. Ideally would plan for colonoscopy during admission, possibly Friday if nausea and vomiting are under control.    Plan / Recommendations   Continue supportive measures Anti emetics per hospitalist Can hold antibiotics for now Pending clinical course, consider colonoscopy on friday Monitor for GI bleeding Repletion of potassium per hospitalist  7. Clear liquid diet  8. NPO midnight in case EGD needed tmrw    03/19/2024, 9:57 AM  Vihaan Gloss L. Christien Berthelot, MSN, APRN, AGNP-C Adult-Gerontology Nurse Practitioner [facility] at [facility]

## 2024-03-20 ENCOUNTER — Telehealth: Payer: Self-pay | Admitting: *Deleted

## 2024-03-20 DIAGNOSIS — R1032 Left lower quadrant pain: Secondary | ICD-10-CM

## 2024-03-20 DIAGNOSIS — R935 Abnormal findings on diagnostic imaging of other abdominal regions, including retroperitoneum: Secondary | ICD-10-CM

## 2024-03-20 DIAGNOSIS — R112 Nausea with vomiting, unspecified: Secondary | ICD-10-CM | POA: Diagnosis not present

## 2024-03-20 DIAGNOSIS — E876 Hypokalemia: Secondary | ICD-10-CM

## 2024-03-20 DIAGNOSIS — R109 Unspecified abdominal pain: Secondary | ICD-10-CM | POA: Diagnosis not present

## 2024-03-20 DIAGNOSIS — E860 Dehydration: Secondary | ICD-10-CM

## 2024-03-20 LAB — BASIC METABOLIC PANEL WITH GFR
Anion gap: 7 (ref 5–15)
BUN: <5 mg/dL — ABNORMAL LOW (ref 8–23)
CO2: 18 mmol/L — ABNORMAL LOW (ref 22–32)
Calcium: 7.7 mg/dL — ABNORMAL LOW (ref 8.9–10.3)
Chloride: 118 mmol/L — ABNORMAL HIGH (ref 98–111)
Creatinine, Ser: 0.39 mg/dL — ABNORMAL LOW (ref 0.44–1.00)
GFR, Estimated: >60 mL/min (ref >60)
Glucose, Bld: 92 mg/dL (ref 70–99)
Potassium: 2.9 mmol/L — ABNORMAL LOW (ref 3.5–5.1)
Sodium: 143 mmol/L (ref 135–145)

## 2024-03-20 LAB — CBC
HCT: 39.3 % (ref 36.0–46.0)
Hemoglobin: 13.6 g/dL (ref 12.0–15.0)
MCH: 32.9 pg (ref 26.0–34.0)
MCHC: 34.6 g/dL (ref 30.0–36.0)
MCV: 94.9 fL (ref 80.0–100.0)
Platelets: 169 10*3/uL (ref 150–400)
RBC: 4.14 MIL/uL (ref 3.87–5.11)
RDW: 13.2 % (ref 11.5–15.5)
WBC: 4.6 10*3/uL (ref 4.0–10.5)
nRBC: 0 % (ref 0.0–0.2)

## 2024-03-20 MED ORDER — SIMETHICONE 80 MG PO CHEW
240.0000 mg | CHEWABLE_TABLET | Freq: Once | ORAL | Status: AC
  Administered 2024-03-20: 240 mg via ORAL
  Filled 2024-03-20: qty 3

## 2024-03-20 MED ORDER — SIMETHICONE 80 MG PO CHEW
240.0000 mg | CHEWABLE_TABLET | Freq: Once | ORAL | Status: AC
  Administered 2024-03-21: 240 mg via ORAL
  Filled 2024-03-20: qty 3

## 2024-03-20 MED ORDER — POTASSIUM CHLORIDE CRYS ER 20 MEQ PO TBCR
40.0000 meq | EXTENDED_RELEASE_TABLET | ORAL | Status: AC
  Administered 2024-03-20 (×2): 40 meq via ORAL
  Filled 2024-03-20 (×2): qty 2

## 2024-03-20 MED ORDER — POTASSIUM CHLORIDE CRYS ER 20 MEQ PO TBCR
40.0000 meq | EXTENDED_RELEASE_TABLET | Freq: Once | ORAL | Status: DC
  Filled 2024-03-20: qty 2

## 2024-03-20 MED ORDER — NA SULFATE-K SULFATE-MG SULF 17.5-3.13-1.6 GM/177ML PO SOLN
0.5000 | Freq: Once | ORAL | Status: AC
  Administered 2024-03-20: 177 mL via ORAL

## 2024-03-20 MED ORDER — POTASSIUM CHLORIDE 10 MEQ/100ML IV SOLN
10.0000 meq | INTRAVENOUS | Status: AC
  Administered 2024-03-20 (×4): 10 meq via INTRAVENOUS
  Filled 2024-03-20 (×4): qty 100

## 2024-03-20 MED ORDER — SODIUM CHLORIDE 0.9 % IV SOLN: INTRAVENOUS | Status: AC

## 2024-03-20 MED ORDER — ENOXAPARIN SODIUM 40 MG/0.4ML IJ SOSY
40.0000 mg | PREFILLED_SYRINGE | INTRAMUSCULAR | Status: DC
  Administered 2024-03-22 – 2024-03-24 (×3): 40 mg via SUBCUTANEOUS
  Filled 2024-03-20 (×3): qty 0.4

## 2024-03-20 MED ORDER — NA SULFATE-K SULFATE-MG SULF 17.5-3.13-1.6 GM/177ML PO SOLN
0.5000 | Freq: Once | ORAL | Status: AC
  Administered 2024-03-21: 177 mL via ORAL

## 2024-03-20 NOTE — Progress Notes (Signed)
 Gastroenterology Progress Note   Referring Provider: No ref. provider found Primary Care Physician:  Lory Rough., PA-C Primary Gastroenterologist:  Dr. Alita Irwin Patient ID: [name]; [id number]; Jul 21, 1961   Subjective:    No vomiting since yesterday at 11am. Has had six loose stools since midnight. No melena, brbpr. Has not eating anything. Broth has too much salt. She does not like sweets, like juices. Tolerates jello and water. Notes if she does not take Linzess  than she cannot have a BM. Typically when she takes it she has 2-3 stools daily. Recently having more than that.   Objective:   Vital signs in last 24 hours: Temp:  [97.8 F (36.6 C)-99 F (37.2 C)] 98.9 F (37.2 C) (06/05 0436) Pulse Rate:  [65-86] 86 (06/05 0436) Resp:  [15-18] 18 (06/05 0436) BP: (128-152)/(74-82) 135/78 (06/05 0436) SpO2:  [97 %-100 %] 97 % (06/05 0436) Weight:  [75.5 kg] 75.5 kg (06/04 0840) Last BM Date : 03/18/24 General:   Alert,  Well-developed, well-nourished, pleasant and cooperative in NAD Head:  Normocephalic and atraumatic. Eyes:  Sclera clear, no icterus.   Abdomen:  Soft,  nondistended.  Mild left sided tenderness Normal bowel sounds, without guarding, and without rebound.   Extremities:  Without clubbing, deformity or edema. Neurologic:  Alert and  oriented x4;  grossly normal neurologically. Skin:  Intact without significant lesions or rashes. Psych:  Alert and cooperative. Normal mood and affect.  Intake/Output from previous day: 06/04 0701 - 06/05 0700 In: 1422.9 [P.O.:580; I.V.:842.9] Out: -  Intake/Output this shift: No intake/output data recorded.  Lab Results: CBC Recent Labs    03/19/24 0029 03/20/24 0637  WBC 15.8* 4.6  HGB 17.4* 13.6  HCT 49.3* 39.3  MCV 92.1 94.9  PLT 320 169   BMET Recent Labs    03/19/24 0029 03/19/24 0612 03/20/24 0432  NA 139 139 143  K 2.5* 3.4* 2.9*  CL 109 116* 118*  CO2 17* 19* 18*  GLUCOSE 189* 124* 92  BUN 8 9  <5*  CREATININE 0.76 0.51 0.39*  CALCIUM  9.8 7.8* 7.7*   LFTs Recent Labs    03/19/24 0029  BILITOT 1.4*  ALKPHOS 96  AST 39  ALT 46*  PROT 8.0  ALBUMIN 4.6   Recent Labs    03/19/24 0029  LIPASE 30   PT/INR No results for input(s): "LABPROT", "INR" in the last 72 hours.       Imaging Studies: DG Abdomen Acute W/Chest Result Date: 03/19/2024 CLINICAL DATA:  Abdominal pain, vomiting EXAM: DG ABDOMEN ACUTE WITH 1 VIEW CHEST COMPARISON:  None Available. FINDINGS: There is no evidence of dilated bowel loops or free intraperitoneal air. Umbilical hernia repair with mesh has been performed. No radiopaque calculi or other significant radiographic abnormality is seen. Heart size and mediastinal contours are within normal limits. Both lungs are clear. IMPRESSION: 1. Negative abdominal radiographs. No acute cardiopulmonary disease. Electronically Signed   By: Worthy Heads M.D.   On: 03/19/2024 00:57   CT Angio Abd/Pel w/ and/or w/o Result Date: 03/14/2024 CLINICAL DATA:  Recent colitis EXAM: CTA ABDOMEN AND PELVIS WITHOUT AND WITH CONTRAST TECHNIQUE: Multidetector CT imaging of the abdomen and pelvis was performed using the standard protocol during bolus administration of intravenous contrast. Multiplanar reconstructed images and MIPs were obtained and reviewed to evaluate the vascular anatomy. RADIATION DOSE REDUCTION: This exam was performed according to the departmental dose-optimization program which includes automated exposure control, adjustment of the mA and/or kV according to  patient size and/or use of iterative reconstruction technique. CONTRAST:  OMNIPAQUE  IOHEXOL  350 MG/ML SOLN COMPARISON:  02/26/2024 FINDINGS: VASCULAR Normal contour and caliber of the abdominal aorta. No evidence of aneurysm, dissection, or other acute aortic pathology. Standard branching pattern of the abdominal aorta with solitary bilateral renal arteries. Moderate mixed calcific atherosclerosis. Minimal  noncalcific narrowing of the origin of the superior mesenteric artery with less than 50% stenosis (series 11, image 109). Review of the MIP images confirms the above findings. NON-VASCULAR Lower Chest: No acute findings. Hepatobiliary: No solid liver abnormality is seen. Hepatic steatosis. No gallstones, gallbladder wall thickening, or biliary dilatation. Pancreas: Unremarkable. No pancreatic ductal dilatation or surrounding inflammatory changes. Spleen: Normal in size without significant abnormality. Adrenals/Urinary Tract: Adrenal glands are unremarkable. Small nonobstructive calculi of the inferior pole of the left kidney. No right-sided calculi, ureteral calculi, or hydronephrosis. Bladder is unremarkable. Stomach/Bowel: Stomach is within normal limits. Appendix not clearly visualized. Sigmoid diverticulosis. The descending colon, sigmoid colon, and rectum are decompressed although there is mild mucosal hyperenhancement. Colon is fluid-filled. Lymphatic: No enlarged abdominal or pelvic lymph nodes. Reproductive: Status post hysterectomy. Other: No abdominal wall hernia or abnormality. No ascites. Musculoskeletal: No acute osseous findings. IMPRESSION: 1. Normal contour and caliber of the abdominal aorta. No evidence of aneurysm, dissection, or other acute aortic pathology. Moderate mixed calcific atherosclerosis. 2. Minimal noncalcific narrowing of the origin of the superior mesenteric artery with less than 50% stenosis. The vessel remains patent and opacified through its proximal order branches. 3. The descending colon, sigmoid colon, and rectum are decompressed although there is mild mucosal hyperenhancement. Colon is fluid-filled. Findings are consistent with nonspecific colitis. 4. Sigmoid diverticulosis without specific evidence of acute diverticulitis. 5. Hepatic steatosis. 6. Nonobstructive left nephrolithiasis 7. Status post hysterectomy. Aortic Atherosclerosis (ICD10-I70.0). Electronically Signed   By:  Fredricka Jenny M.D.   On: 03/14/2024 17:14   CT ABDOMEN PELVIS W CONTRAST Result Date: 02/26/2024 CLINICAL DATA:  Acute non localized abdominal pain. EXAM: CT ABDOMEN AND PELVIS WITH CONTRAST TECHNIQUE: Multidetector CT imaging of the abdomen and pelvis was performed using the standard protocol following bolus administration of intravenous contrast. RADIATION DOSE REDUCTION: This exam was performed according to the departmental dose-optimization program which includes automated exposure control, adjustment of the mA and/or kV according to patient size and/or use of iterative reconstruction technique. CONTRAST:  OMNIPAQUE  IOHEXOL  300 MG/ML  SOLN COMPARISON:  12/06/2023 FINDINGS: Lower Chest: No acute findings. Hepatobiliary: No suspicious hepatic masses identified. Prior cholecystectomy. No evidence of biliary obstruction. Pancreas:  No mass or inflammatory changes. Spleen: Within normal limits in size and appearance. Adrenals/Urinary Tract: No suspicious masses identified. Several 2-3 mm nonobstructing calculi are again seen in the lower pole of the left kidney. No evidence of ureteral calculi or hydronephrosis. Unremarkable unopacified urinary bladder. Stomach/Bowel: Mild wall thickening is seen involving the splenic flexure and descending colon, consistent with colitis. Sigmoid diverticulosis is noted, without signs of diverticulitis in this region. No evidence of bowel obstruction or abnormal fluid collections. Vascular/Lymphatic: No pathologically enlarged lymph nodes. No acute vascular findings. Reproductive: Prior hysterectomy noted. Adnexal regions are unremarkable in appearance. Other: Surgical mesh again seen within the lower anterior abdominal wall, without evidence of recurrent hernia. Musculoskeletal:  No suspicious bone lesions identified. IMPRESSION: Mild colitis involving the splenic flexure and descending colon. Sigmoid diverticulosis, without radiographic evidence of diverticulitis. Tiny  nonobstructing left renal calculi. No evidence of ureteral calculi or hydronephrosis. Electronically Signed   By: Deward Force.D.  On: 02/26/2024 13:15  [2 weeks]  Assessment:    [name] is a 63 y.o. year old female with past medical history of epilepsy, COPD, CVA, rheumatoid arthritis on adalimumab, PBC on Urso , PFO with recent admission earlier this month with abdominal pain, rectal bleeding, CT concerning for colitis, treated with IV meropenem , recent outpatient CT angio A/P on 5/30 without findings to suggest mesenteric ischemia but ongoing colitis, prescribed augmentin, who presented to the ED late last night with nausea, vomiting and worsening abdominal pain. GI consulted for further evaluation   Abdominal pain, nausea, vomiting, colitis: initial colitis diagnosis earlier this month, treated with meropenem  IV and discharged. She was seen in outpatient clinic with no noted improvement in pain but resolution of rectal bleeding. CT Angio as above with some narrowing of SMA but no occlusion, hyperenhancement of colon and rectum concerning for ongoign colitis, prescribed 10 days of augmentin and scheduled for follow up colonoscopy on 7/7.   She presented to the ED with ongoing pain and intractable nausea and vomiting after taking first dose of augmentin (notably has history of nausea and vomiting secondary to augmentin) she got no relief from zofran .  Seems to have some mild improvement in nausea with reglan  and compazine  though continues to have some nausea and abdominal pain has persisted. No further vomiting in over 24 hours.    Etiology of her colitis is unclear, low suspicion for infectious etiology, will hold off on antibiotics for now especially given her nausea and vomiting. Ideally would plan for colonoscopy during admission, possibly Friday if nausea and vomiting are under control.    Plan:   Continue supportive measures.  Restart clear liquids. NPO after midnight. Replete  potassium. Colonoscopy tomorrow. Patient already purchased Suprep for planned outpatient colonoscopy. She will use for inpatient prep.    LOS: 1 day   Trudie Fuse. Verlie Glisson [facility] [phone] 6/5/20258:18 AM

## 2024-03-20 NOTE — Care Management Important Message (Signed)
 Important Message  Patient Details  Name: [name] MRN: [id number] Date of Birth: 04/22/1961   Important Message Given:  N/A - LOS <3 / Initial given by admissions     Neila Bally 03/20/2024, 12:24 PM

## 2024-03-20 NOTE — Progress Notes (Signed)
 Progress Note   Patient: [name] GEX:[id number] DOB: [birth year] DOA: 03/19/2024     1 DOS: the patient was seen and examined on 03/20/2024   Brief hospital admission narrative: Cullen A Omdahl is a 63 y.o. female with medical history significant of asthma/COPD, depression, chronic pain syndrome, GERD, hyperlipidemia, restless leg syndrome, rheumatoid arthritis, history of CVA, seizure disorder, IBS with chronic constipation, restless leg syndrome and history of primary biliary cirrhosis who presented to the emergency department complaining of lower abdominal pain with associated nausea and vomiting.  Patient expressed difficulty keeping things down and maintaining adequate hydration.   There has not been fever, bloody emesis, chest pain, shortness of breath, focal weaknesses or sick contacts.   Of note, patient with recent hospitalization in mid May for similar symptoms where she was found to have diverticulitis and after being treated with antibiotics was discharged home with improvement in her symptoms and instruction to follow-up with gastroenterology as an outpatient.  Patient expressed that her symptoms has continue waxing and waning and definitely worse in the last 48 hours prior to admission.   In the ED basic metabolic panel demonstrated metabolic acidosis, hypokalemia and ALT of 46; white blood cells 15.8, hemoglobin 17.4 and platelet count 320K.  Magnesium  1.7 and lipase within normal limits.  Fluid resuscitation initiated, GI service consulted and [facility] contacted to place patient in the hospital for further evaluation and management.  Assessment and plan 1-abdominal pain, nausea/vomiting - Continue bowel rest with clear liquid and after discussing with GI service bowel prep for colonoscopy 03/21/24 - Continue as needed analgesics, antiemetics and maintain adequate hydration.  2-hypokalemia - In the setting losses - Continue electrolyte repletion.  3-GERD - Continue  PPI  4-hypertension - Continue current antihypertensive agents - Follow vital signs - Discussion regarding low-sodium diet provided.  5-hyperlipidemia - Continue statin.  6-chronic pain syndrome/5 myalgia - Continue home analgesic therapy  7-anxiety/depression - No suicidal ideation hallucination - Stable mood - Continue home anxiolytic and antidepressant therapy.  8-leukocytosis - In the setting of stress demargination and hemoconcentration from dehydration - After fluid resuscitation WBCs within normal limits - Continue to monitor noted - Patient is afebrile.  9-history of RA/history of PBC - Continue treatment with Urso  - Continue outpatient follow-up with rheumatology service and continue treatment with adalimumab.  Subjective:  Complaining of intermittent nausea and ongoing abdominal pain.  Currently no vomiting.  Patient is afebrile.  Hemodynamically stable.  Physical Exam: Vitals:   03/19/24 1626 03/19/24 2049 03/20/24 0436 03/20/24 1243  BP: 128/77 (!) 152/75 135/78 (!) 149/87  Pulse: 76 80 86 82  Resp:  16 18 18   Temp: 99 F (37.2 C) 98.5 F (36.9 C) 98.9 F (37.2 C) 98 F (36.7 C)  TempSrc: Oral Oral Oral Oral  SpO2: 98% 98% 97% 99%  Weight:      Height:       General exam: Alert, awake, oriented x 3; complaining of intermittent nausea and ongoing abdominal pain. Respiratory system: Clear to auscultation. Respiratory effort normal.  Good saturation on room air. Cardiovascular system:RRR. No murmurs, rubs, gallops. Gastrointestinal system: Abdomen is nondistended, soft and no guarding.  Positive bowel sounds. Central nervous system: Alert and oriented. No focal neurological deficits. Extremities: No C/C/E, +pedal pulses Skin: No rashes, lesions or ulcers Psychiatry: Judgement and insight appear normal. Mood & affect appropriate.    Data Reviewed: Basic metabolic panel: Sodium 143, potassium 2.9, chloride 118, bicarb 18, BUN <5, creatinine 0.39 and  GFR >60  CBC: WBCs 4.6, hemoglobin 13.6 and platelet count 169K  Family Communication: No family at bedside.  Disposition: Status is: Inpatient Remains inpatient appropriate because: Continue electrolytes repletion, IV therapy and GI workup.  Discharge back home once medically stable.  Time spent: 35 minutes  Author: Justina Oman, MD 03/20/2024 4:18 PM  For on call review [email or web address].

## 2024-03-20 NOTE — Telephone Encounter (Signed)
-----   Message from Hargis Lias sent at 03/20/2024  4:11 PM EDT ----- Regarding: cancel TCS Hi [name]  Patient is scheduled for TCS on 7/7 which can be cancelled since she is getting it done as inpatient tomorrow  Thanks

## 2024-03-20 NOTE — Progress Notes (Signed)
 Patient's potassium IV is running at half rate for dilution due to patient pain intolerance to the potassium fluids. Even with dilution unable to run at full rate. Tolerating at this time.

## 2024-03-20 NOTE — Plan of Care (Signed)

## 2024-03-20 NOTE — H&P (View-Only) (Signed)
 Gastroenterology Progress Note   Referring Provider: No ref. provider found Primary Care Physician:  Lory Rough., PA-C Primary Gastroenterologist:  Dr. Alita Irwin Patient ID: [name]; [id number]; Jul 21, 1961   Subjective:    No vomiting since yesterday at 11am. Has had six loose stools since midnight. No melena, brbpr. Has not eating anything. Broth has too much salt. She does not like sweets, like juices. Tolerates jello and water. Notes if she does not take Linzess  than she cannot have a BM. Typically when she takes it she has 2-3 stools daily. Recently having more than that.   Objective:   Vital signs in last 24 hours: Temp:  [97.8 F (36.6 C)-99 F (37.2 C)] 98.9 F (37.2 C) (06/05 0436) Pulse Rate:  [65-86] 86 (06/05 0436) Resp:  [15-18] 18 (06/05 0436) BP: (128-152)/(74-82) 135/78 (06/05 0436) SpO2:  [97 %-100 %] 97 % (06/05 0436) Weight:  [75.5 kg] 75.5 kg (06/04 0840) Last BM Date : 03/18/24 General:   Alert,  Well-developed, well-nourished, pleasant and cooperative in NAD Head:  Normocephalic and atraumatic. Eyes:  Sclera clear, no icterus.   Abdomen:  Soft,  nondistended.  Mild left sided tenderness Normal bowel sounds, without guarding, and without rebound.   Extremities:  Without clubbing, deformity or edema. Neurologic:  Alert and  oriented x4;  grossly normal neurologically. Skin:  Intact without significant lesions or rashes. Psych:  Alert and cooperative. Normal mood and affect.  Intake/Output from previous day: 06/04 0701 - 06/05 0700 In: 1422.9 [P.O.:580; I.V.:842.9] Out: -  Intake/Output this shift: No intake/output data recorded.  Lab Results: CBC Recent Labs    03/19/24 0029 03/20/24 0637  WBC 15.8* 4.6  HGB 17.4* 13.6  HCT 49.3* 39.3  MCV 92.1 94.9  PLT 320 169   BMET Recent Labs    03/19/24 0029 03/19/24 0612 03/20/24 0432  NA 139 139 143  K 2.5* 3.4* 2.9*  CL 109 116* 118*  CO2 17* 19* 18*  GLUCOSE 189* 124* 92  BUN 8 9  <5*  CREATININE 0.76 0.51 0.39*  CALCIUM  9.8 7.8* 7.7*   LFTs Recent Labs    03/19/24 0029  BILITOT 1.4*  ALKPHOS 96  AST 39  ALT 46*  PROT 8.0  ALBUMIN 4.6   Recent Labs    03/19/24 0029  LIPASE 30   PT/INR No results for input(s): "LABPROT", "INR" in the last 72 hours.       Imaging Studies: DG Abdomen Acute W/Chest Result Date: 03/19/2024 CLINICAL DATA:  Abdominal pain, vomiting EXAM: DG ABDOMEN ACUTE WITH 1 VIEW CHEST COMPARISON:  None Available. FINDINGS: There is no evidence of dilated bowel loops or free intraperitoneal air. Umbilical hernia repair with mesh has been performed. No radiopaque calculi or other significant radiographic abnormality is seen. Heart size and mediastinal contours are within normal limits. Both lungs are clear. IMPRESSION: 1. Negative abdominal radiographs. No acute cardiopulmonary disease. Electronically Signed   By: Worthy Heads M.D.   On: 03/19/2024 00:57   CT Angio Abd/Pel w/ and/or w/o Result Date: 03/14/2024 CLINICAL DATA:  Recent colitis EXAM: CTA ABDOMEN AND PELVIS WITHOUT AND WITH CONTRAST TECHNIQUE: Multidetector CT imaging of the abdomen and pelvis was performed using the standard protocol during bolus administration of intravenous contrast. Multiplanar reconstructed images and MIPs were obtained and reviewed to evaluate the vascular anatomy. RADIATION DOSE REDUCTION: This exam was performed according to the departmental dose-optimization program which includes automated exposure control, adjustment of the mA and/or kV according to  patient size and/or use of iterative reconstruction technique. CONTRAST:  OMNIPAQUE  IOHEXOL  350 MG/ML SOLN COMPARISON:  02/26/2024 FINDINGS: VASCULAR Normal contour and caliber of the abdominal aorta. No evidence of aneurysm, dissection, or other acute aortic pathology. Standard branching pattern of the abdominal aorta with solitary bilateral renal arteries. Moderate mixed calcific atherosclerosis. Minimal  noncalcific narrowing of the origin of the superior mesenteric artery with less than 50% stenosis (series 11, image 109). Review of the MIP images confirms the above findings. NON-VASCULAR Lower Chest: No acute findings. Hepatobiliary: No solid liver abnormality is seen. Hepatic steatosis. No gallstones, gallbladder wall thickening, or biliary dilatation. Pancreas: Unremarkable. No pancreatic ductal dilatation or surrounding inflammatory changes. Spleen: Normal in size without significant abnormality. Adrenals/Urinary Tract: Adrenal glands are unremarkable. Small nonobstructive calculi of the inferior pole of the left kidney. No right-sided calculi, ureteral calculi, or hydronephrosis. Bladder is unremarkable. Stomach/Bowel: Stomach is within normal limits. Appendix not clearly visualized. Sigmoid diverticulosis. The descending colon, sigmoid colon, and rectum are decompressed although there is mild mucosal hyperenhancement. Colon is fluid-filled. Lymphatic: No enlarged abdominal or pelvic lymph nodes. Reproductive: Status post hysterectomy. Other: No abdominal wall hernia or abnormality. No ascites. Musculoskeletal: No acute osseous findings. IMPRESSION: 1. Normal contour and caliber of the abdominal aorta. No evidence of aneurysm, dissection, or other acute aortic pathology. Moderate mixed calcific atherosclerosis. 2. Minimal noncalcific narrowing of the origin of the superior mesenteric artery with less than 50% stenosis. The vessel remains patent and opacified through its proximal order branches. 3. The descending colon, sigmoid colon, and rectum are decompressed although there is mild mucosal hyperenhancement. Colon is fluid-filled. Findings are consistent with nonspecific colitis. 4. Sigmoid diverticulosis without specific evidence of acute diverticulitis. 5. Hepatic steatosis. 6. Nonobstructive left nephrolithiasis 7. Status post hysterectomy. Aortic Atherosclerosis (ICD10-I70.0). Electronically Signed   By:  Fredricka Jenny M.D.   On: 03/14/2024 17:14   CT ABDOMEN PELVIS W CONTRAST Result Date: 02/26/2024 CLINICAL DATA:  Acute non localized abdominal pain. EXAM: CT ABDOMEN AND PELVIS WITH CONTRAST TECHNIQUE: Multidetector CT imaging of the abdomen and pelvis was performed using the standard protocol following bolus administration of intravenous contrast. RADIATION DOSE REDUCTION: This exam was performed according to the departmental dose-optimization program which includes automated exposure control, adjustment of the mA and/or kV according to patient size and/or use of iterative reconstruction technique. CONTRAST:  OMNIPAQUE  IOHEXOL  300 MG/ML  SOLN COMPARISON:  12/06/2023 FINDINGS: Lower Chest: No acute findings. Hepatobiliary: No suspicious hepatic masses identified. Prior cholecystectomy. No evidence of biliary obstruction. Pancreas:  No mass or inflammatory changes. Spleen: Within normal limits in size and appearance. Adrenals/Urinary Tract: No suspicious masses identified. Several 2-3 mm nonobstructing calculi are again seen in the lower pole of the left kidney. No evidence of ureteral calculi or hydronephrosis. Unremarkable unopacified urinary bladder. Stomach/Bowel: Mild wall thickening is seen involving the splenic flexure and descending colon, consistent with colitis. Sigmoid diverticulosis is noted, without signs of diverticulitis in this region. No evidence of bowel obstruction or abnormal fluid collections. Vascular/Lymphatic: No pathologically enlarged lymph nodes. No acute vascular findings. Reproductive: Prior hysterectomy noted. Adnexal regions are unremarkable in appearance. Other: Surgical mesh again seen within the lower anterior abdominal wall, without evidence of recurrent hernia. Musculoskeletal:  No suspicious bone lesions identified. IMPRESSION: Mild colitis involving the splenic flexure and descending colon. Sigmoid diverticulosis, without radiographic evidence of diverticulitis. Tiny  nonobstructing left renal calculi. No evidence of ureteral calculi or hydronephrosis. Electronically Signed   By: Deward Force.D.  On: 02/26/2024 13:15  [2 weeks]  Assessment:    SHARLINE LEHANE is a 63 y.o. year old female with past medical history of epilepsy, COPD, CVA, rheumatoid arthritis on adalimumab, PBC on Urso , PFO with recent admission earlier this month with abdominal pain, rectal bleeding, CT concerning for colitis, treated with IV meropenem , recent outpatient CT angio A/P on 5/30 without findings to suggest mesenteric ischemia but ongoing colitis, prescribed augmentin, who presented to the ED late last night with nausea, vomiting and worsening abdominal pain. GI consulted for further evaluation   Abdominal pain, nausea, vomiting, colitis: initial colitis diagnosis earlier this month, treated with meropenem  IV and discharged. She was seen in outpatient clinic with no noted improvement in pain but resolution of rectal bleeding. CT Angio as above with some narrowing of SMA but no occlusion, hyperenhancement of colon and rectum concerning for ongoign colitis, prescribed 10 days of augmentin and scheduled for follow up colonoscopy on 7/7.   She presented to the ED with ongoing pain and intractable nausea and vomiting after taking first dose of augmentin (notably has history of nausea and vomiting secondary to augmentin) she got no relief from zofran .  Seems to have some mild improvement in nausea with reglan  and compazine  though continues to have some nausea and abdominal pain has persisted. No further vomiting in over 24 hours.    Etiology of her colitis is unclear, low suspicion for infectious etiology, will hold off on antibiotics for now especially given her nausea and vomiting. Ideally would plan for colonoscopy during admission, possibly Friday if nausea and vomiting are under control.    Plan:   Continue supportive measures.  Restart clear liquids. NPO after midnight. Replete  potassium. Colonoscopy tomorrow. Patient already purchased Suprep for planned outpatient colonoscopy. She will use for inpatient prep.    LOS: 1 day   Trudie Fuse. Verlie Glisson [facility] [phone] 6/5/20258:18 AM

## 2024-03-20 NOTE — Telephone Encounter (Signed)
Message sent to endo to cancel 

## 2024-03-21 ENCOUNTER — Inpatient Hospital Stay (HOSPITAL_COMMUNITY): Admitting: Certified Registered Nurse Anesthetist

## 2024-03-21 ENCOUNTER — Encounter (HOSPITAL_COMMUNITY)
Admission: EM | Disposition: A | Discharge status: Home / Self Care | Payer: Self-pay | Source: Home / Self Care | Attending: Student

## 2024-03-21 ENCOUNTER — Encounter (HOSPITAL_COMMUNITY): Payer: Self-pay | Admitting: Internal Medicine

## 2024-03-21 DIAGNOSIS — K449 Diaphragmatic hernia without obstruction or gangrene: Secondary | ICD-10-CM

## 2024-03-21 DIAGNOSIS — I10 Essential (primary) hypertension: Secondary | ICD-10-CM

## 2024-03-21 DIAGNOSIS — R933 Abnormal findings on diagnostic imaging of other parts of digestive tract: Secondary | ICD-10-CM | POA: Diagnosis not present

## 2024-03-21 DIAGNOSIS — K529 Noninfective gastroenteritis and colitis, unspecified: Secondary | ICD-10-CM | POA: Diagnosis not present

## 2024-03-21 DIAGNOSIS — K2980 Duodenitis without bleeding: Secondary | ICD-10-CM | POA: Diagnosis not present

## 2024-03-21 DIAGNOSIS — R197 Diarrhea, unspecified: Secondary | ICD-10-CM

## 2024-03-21 DIAGNOSIS — K2950 Unspecified chronic gastritis without bleeding: Secondary | ICD-10-CM | POA: Diagnosis not present

## 2024-03-21 DIAGNOSIS — R1310 Dysphagia, unspecified: Secondary | ICD-10-CM

## 2024-03-21 DIAGNOSIS — R112 Nausea with vomiting, unspecified: Secondary | ICD-10-CM | POA: Diagnosis not present

## 2024-03-21 HISTORY — PX: ESOPHAGOGASTRODUODENOSCOPY: SHX5428

## 2024-03-21 HISTORY — PX: COLONOSCOPY: SHX5424

## 2024-03-21 LAB — BASIC METABOLIC PANEL WITH GFR
Anion gap: 14 (ref 5–15)
Anion gap: 11 (ref 5–15)
BUN: <5 mg/dL — ABNORMAL LOW (ref 8–23)
BUN: <5 mg/dL — ABNORMAL LOW (ref 8–23)
CO2: 14 mmol/L — ABNORMAL LOW (ref 22–32)
CO2: 14 mmol/L — ABNORMAL LOW (ref 22–32)
Calcium: 9.2 mg/dL (ref 8.9–10.3)
Calcium: 9.1 mg/dL (ref 8.9–10.3)
Chloride: 114 mmol/L — ABNORMAL HIGH (ref 98–111)
Chloride: 115 mmol/L — ABNORMAL HIGH (ref 98–111)
Creatinine, Ser: 0.5 mg/dL (ref 0.44–1.00)
Creatinine, Ser: 0.34 mg/dL — ABNORMAL LOW (ref 0.44–1.00)
GFR, Estimated: >60 mL/min (ref >60)
GFR, Estimated: >60 mL/min (ref >60)
Glucose, Bld: 142 mg/dL — ABNORMAL HIGH (ref 70–99)
Glucose, Bld: 115 mg/dL — ABNORMAL HIGH (ref 70–99)
Potassium: 3.5 mmol/L (ref 3.5–5.1)
Potassium: 3.5 mmol/L (ref 3.5–5.1)
Sodium: 142 mmol/L (ref 135–145)
Sodium: 140 mmol/L (ref 135–145)

## 2024-03-21 LAB — BLOOD GAS, VENOUS
Acid-base deficit: 7.6 mmol/L — ABNORMAL HIGH (ref 0.0–2.0)
Bicarbonate: 16 mmol/L — ABNORMAL LOW (ref 20.0–28.0)
Drawn by: 27160
O2 Saturation: 90.4 %
Patient temperature: 36.7
pCO2, Ven: 27 mmHg — ABNORMAL LOW (ref 44–60)
pH, Ven: 7.38 (ref 7.25–7.43)
pO2, Ven: 58 mmHg — ABNORMAL HIGH (ref 32–45)

## 2024-03-21 LAB — BETA-HYDROXYBUTYRIC ACID: Beta-Hydroxybutyric Acid: 0.77 mmol/L — ABNORMAL HIGH (ref 0.05–0.27)

## 2024-03-21 LAB — MAGNESIUM: Magnesium: 2 mg/dL (ref 1.7–2.4)

## 2024-03-21 SURGERY — COLONOSCOPY
Anesthesia: General

## 2024-03-21 SURGERY — EGD (ESOPHAGOGASTRODUODENOSCOPY)
Anesthesia: Choice

## 2024-03-21 MED ORDER — LIDOCAINE 2% (20 MG/ML) 5 ML SYRINGE
INTRAMUSCULAR | Status: DC | PRN
Start: 2024-03-21 — End: 2024-03-21
  Administered 2024-03-21: 100 mg via INTRAVENOUS

## 2024-03-21 MED ORDER — LACTATED RINGERS IV BOLUS
1000.0000 mL | Freq: Once | INTRAVENOUS | Status: AC
  Administered 2024-03-21: 1000 mL via INTRAVENOUS

## 2024-03-21 MED ORDER — PROPOFOL 10 MG/ML IV BOLUS
INTRAVENOUS | Status: DC | PRN
Start: 2024-03-21 — End: 2024-03-21
  Administered 2024-03-21: 80 mg via INTRAVENOUS
  Administered 2024-03-21 (×2): 40 mg via INTRAVENOUS
  Administered 2024-03-21: 125 ug/kg/min via INTRAVENOUS

## 2024-03-21 MED ORDER — LACTATED RINGERS IV SOLN: INTRAVENOUS | Status: DC | PRN

## 2024-03-21 MED ORDER — STERILE WATER FOR IRRIGATION IR SOLN
Status: DC | PRN
  Administered 2024-03-21: 60 mL

## 2024-03-21 MED ORDER — ONDANSETRON HCL 4 MG/2ML IJ SOLN
INTRAMUSCULAR | Status: DC | PRN
  Administered 2024-03-21: 4 mg via INTRAVENOUS

## 2024-03-21 MED ORDER — DEXTROSE IN LACTATED RINGERS 5 % IV SOLN: INTRAVENOUS | Status: AC

## 2024-03-21 MED ORDER — POTASSIUM CHLORIDE 10 MEQ/100ML IV SOLN
10.0000 meq | INTRAVENOUS | Status: DC
  Administered 2024-03-21 (×2): 10 meq via INTRAVENOUS
  Filled 2024-03-21 (×4): qty 100

## 2024-03-21 MED ORDER — POTASSIUM CHLORIDE CRYS ER 20 MEQ PO TBCR
20.0000 meq | EXTENDED_RELEASE_TABLET | Freq: Once | ORAL | Status: AC
  Administered 2024-03-21: 20 meq via ORAL
  Filled 2024-03-21: qty 1

## 2024-03-21 NOTE — Op Note (Signed)
 [facility] Patient Name: [name] Procedure Date: 03/21/2024 2:44 PM MRN: [id number] Date of Birth: 1961/03/16 Attending MD: Gemma Kelp , MD, [id number] CSN: [id number] Age: 63 Admit Type: Inpatient Procedure:                Colonoscopy Indications:              Chronic diarrhea, Abnormal CT of the GI tract Providers:                Gemma Kelp, MD, Willena Harp, Vonna Guardian, Annell Barrow Referring MD:              Medicines:                Propofol  per Anesthesia Complications:            No immediate complications. Estimated Blood Loss:     Estimated blood loss was minimal. Procedure:                Pre-Anesthesia Assessment:                           - Prior to the procedure, a History and Physical                            was performed, and patient medications and                            allergies were reviewed. The patient's tolerance of                            previous anesthesia was also reviewed. The risks                            and benefits of the procedure and the sedation                            options and risks were discussed with the patient.                            All questions were answered, and informed consent                            was obtained. Prior Anticoagulants: The patient has                            taken no anticoagulant or antiplatelet agents. ASA                            Grade Assessment: II - A patient with mild systemic                            disease. After reviewing the risks and benefits,  the patient was deemed in satisfactory condition to                            undergo the procedure.                           After obtaining informed consent, the colonoscope                            was passed under direct vision. Throughout the                            procedure, the patient's blood pressure, pulse, and                             oxygen saturations were monitored continuously. The                            [id number]) scope was introduced through the                            anus and advanced to the the cecum, identified by                            appendiceal orifice and ileocecal valve. Scope In: 3:27:32 PM Scope Out: 3:47:57 PM Scope Withdrawal Time: 0 hours 11 minutes 12 seconds  Total Procedure Duration: 0 hours 20 minutes 25 seconds  Findings:      The perianal and digital rectal examinations were normal. Rectum       appeared grossly normal. The sigmoid and descending segments were       somewhat boggy and edematous in appearance with loss of the normal       vascular pattern but no granularity erosion or ulcer seen. More       proximally, the colonic mucosa appeared normal to the cecum. The prep       was somewhat limited there was some mushy stool scattered about the       colon. I would consider the prep adequate for indication which is       evaluating patient for colitis and diarrhea but not for smaller lesions       such as polyps. Impression:               - Edematous left colon as described. Status post                            segmental biopsy.. Moderate Sedation:      Moderate (conscious) sedation was personally administered by an       anesthesia professional. The following parameters were monitored: oxygen       saturation, heart rate, blood pressure, respiratory rate, EKG, adequacy       of pulmonary ventilation, and response to care. Recommendation:           - Return patient to hospital ward for ongoing care.                           - Clear liquid diet.  Follow-up on pathology. See                            EGD report. At patient request, I called Ollis Bi at [phone]. Call rolled to voicemail.                            Left a detailed message.                           - In PACU patient was noted to have 5 out of 10                             abdominal pain (came down here with 4 out of 10                            abdominal pain). Mild suprapubic tenderness to                            palpation. No abdominal distention. No rebound. She                            has Dilaudid  ordered upstairs. Will give her 1 mg                            of Dilaudid  in PACU. If pain persist and/ or                            escalates will pursue cross-sectional imaging. Procedure Code(s):        --- Professional ---                           [phone], Colonoscopy, flexible; diagnostic, including                            collection of specimen(s) by brushing or washing,                            when performed (separate procedure) Diagnosis Code(s):        --- Professional ---                           K52.9, Noninfective gastroenteritis and colitis,                            unspecified                           R93.3, Abnormal findings on diagnostic imaging of                            other parts of digestive tract CPT copyright 2022 American Medical  Association. All rights reserved. The codes documented in this report are preliminary and upon coder review may  be revised to meet current compliance requirements. Windsor Hatcher. Jamari Moten, MD Gemma Kelp, MD 03/21/2024 4:28:17 PM This report has been signed electronically. Number of Addenda: 0

## 2024-03-21 NOTE — Progress Notes (Signed)
 Mobility Specialist Progress Note:    03/21/24 1250  Mobility  Activity Ambulated with assistance in hallway  Level of Assistance Modified independent, requires aide device or extra time  Assistive Device None  Distance Ambulated (ft) 350 ft  Range of Motion/Exercises Active;All extremities  Activity Response Tolerated well  Mobility Referral Yes  Mobility visit 1 Mobility  Mobility Specialist Start Time (ACUTE ONLY) 1250  Mobility Specialist Stop Time (ACUTE ONLY) 1310  Mobility Specialist Time Calculation (min) (ACUTE ONLY) 20 min   Pt received in bed, agreeable to mobility. Husband in room, ModI to stand and ambulate with no AD. Tolerated well, asx throughout. Left pt standing EOB, all needs met.  Scarlet Abad Mobility Specialist Please contact via Special educational needs teacher or  Rehab office at [phone]

## 2024-03-21 NOTE — Interval H&P Note (Signed)
 History and Physical Interval Note:  03/21/2024 2:50 PM  Bartholomew Light A Eischen  has presented today for surgery, with the diagnosis of colitis on scans, history of rectal bleeding, left sided abdominal pain, change in bowels, nausea, vomiting.  The various methods of treatment have been discussed with the patient and family. After consideration of risks, benefits and other options for treatment, the patient has consented to  Procedure(s): COLONOSCOPY (N/A) EGD (ESOPHAGOGASTRODUODENOSCOPY) (N/A) as a surgical intervention.  The patient's history has been reviewed, patient examined, no change in status, stable for surgery.  I have reviewed the patient's chart and labs.  Questions were answered to the patient's satisfaction.     Garnette Ka   patient stable overnight.  Potassium 3.5, magnesium  2.0.    No change in symptoms.  Does relate intermittent esophageal dysphagia to solids.  Agree with need for EGD and colonoscopy have offered the patient EGD with esophageal dilation as feasible/appropriate per plan   As well as a diagnostic colonoscopy. The risks, benefits, limitations, imponderables and alternatives regarding both EGD and colonoscopy have been reviewed with the patient. Questions have been answered. All parties agreeable.

## 2024-03-21 NOTE — Anesthesia Postprocedure Evaluation (Signed)
 Anesthesia Post Note  Patient: [name]  Procedure(s) Performed: COLONOSCOPY EGD (ESOPHAGOGASTRODUODENOSCOPY)  Patient location during evaluation: PACU Anesthesia Type: General Level of consciousness: awake and alert Pain management: pain level controlled Vital Signs Assessment: post-procedure vital signs reviewed and stable Respiratory status: spontaneous breathing, nonlabored ventilation, respiratory function stable and patient connected to nasal cannula oxygen Cardiovascular status: blood pressure returned to baseline and stable Postop Assessment: no apparent nausea or vomiting Anesthetic complications: no   No notable events documented.   Last Vitals:  Vitals:   03/21/24 1555 03/21/24 1559  BP: 110/78 119/73  Pulse: 71 72  Resp: (!) 28 16  Temp: (!) 36.4 C   SpO2: 99% 99%    Last Pain:  Vitals:   03/21/24 1555  TempSrc:   PainSc: Asleep                 Coretha Dew

## 2024-03-21 NOTE — Plan of Care (Signed)

## 2024-03-21 NOTE — Op Note (Addendum)
 [facility] Patient Name: [name] Procedure Date: 03/21/2024 2:45 PM MRN: [id number] Date of Birth: 06-28-1961 Attending MD: Gemma Kelp , MD, [id number] CSN: [id number] Age: 63 Admit Type: Inpatient Procedure:                Upper GI endoscopy Indications:              Dysphagia, Nausea with vomiting Providers:                Gemma Kelp, MD, Willena Harp, Vonna Guardian, Annell Barrow Referring MD:              Medicines:                Propofol  per Anesthesia Complications:            No immediate complications. Estimated Blood Loss:     Estimated blood loss was minimal. Procedure:                Pre-Anesthesia Assessment:                           - Prior to the procedure, a History and Physical                            was performed, and patient medications and                            allergies were reviewed. The patient's tolerance of                            previous anesthesia was also reviewed. The risks                            and benefits of the procedure and the sedation                            options and risks were discussed with the patient.                            All questions were answered, and informed consent                            was obtained. Prior Anticoagulants: The patient                            last took Lovenox  (enoxaparin ) 1 day prior to the                            procedure. ASA Grade Assessment: III - A patient                            with severe systemic disease. After reviewing the  risks and benefits, the patient was deemed in                            satisfactory condition to undergo the procedure.                           After obtaining informed consent, the endoscope was                            passed under direct vision. Throughout the                            procedure, the patient's blood pressure, pulse, and                             oxygen saturations were monitored continuously. The                            [id number]-[id number] ([id number]) scope was introduced through the                            mouth, and advanced to the second part of duodenum.                            The upper GI endoscopy was accomplished without                            difficulty. The patient tolerated the procedure                            well. Scope In: 3:12:05 PM Scope Out: 3:21:59 PM Total Procedure Duration: 0 hours 9 minutes 54 seconds  Findings:      The examined esophagus was normal.      A small hiatal hernia was present. Mottled gastric mucosa with patchy       "Leopard skin" appearance. No ulcer or infiltrating process observed.       Pylorus patent and easily traversed      Examination the bulb and second portion revealed inflamed appearing       duodenal mucosa. Did not see any scalloping. There was significant       redness in the base of folds. Please see photos.The scope was withdrawn.       Dilation was performed with a Maloney dilator with mild resistance at 54       Fr. The dilation site was examined following endoscope reinsertion and       showed no change. Estimated blood loss: none. Next, biopsies of the       antrum body and fundus were taken. Finally, biopsies of the second       portion of the duodenum were taken. Impression:               - Normal esophagus. Dilated. Abnormal gastric                            mucosa of uncertain significance?status post biopsy                           -  Small hiatal hernia.                           -Status post duodenal biopsy. Moderate Sedation:      Moderate (conscious) sedation was personally administered by an       anesthesia professional. The following parameters were monitored: oxygen       saturation, heart rate, blood pressure, respiratory rate, EKG, adequacy       of pulmonary ventilation, and response to care. Recommendation:           - Return patient to  hospital ward for observation.                           - Clear liquid diet.                           - Continue present medications. Follow-up on                            pathology. See colonoscopy report Procedure Code(s):        --- Professional ---                           [phone], Esophagogastroduodenoscopy, flexible,                            transoral; diagnostic, including collection of                            specimen(s) by brushing or washing, when performed                            (separate procedure)                           43450, Dilation of esophagus, by unguided sound or                            bougie, single or multiple passes Diagnosis Code(s):        --- Professional ---                           K44.9, Diaphragmatic hernia without obstruction or                            gangrene                           R13.10, Dysphagia, unspecified                           R11.2, Nausea with vomiting, unspecified CPT copyright 2022 American Medical Association. All rights reserved. The codes documented in this report are preliminary and upon coder review may  be revised to meet current compliance requirements. Windsor Hatcher. Zakai Gonyea, MD Gemma Kelp, MD 03/21/2024 3:51:31 PM This report has been signed electronically. Number of Addenda: 0

## 2024-03-21 NOTE — Anesthesia Preprocedure Evaluation (Signed)
 Anesthesia Evaluation  Patient identified by MRN, date of birth, ID band Patient awake    Reviewed: Allergy & Precautions, H&P , NPO status , Patient's Chart, lab work & pertinent test results, reviewed documented beta blocker date and time   History of Anesthesia Complications (+) PONV, POST - OP SPINAL HEADACHE and history of anesthetic complications  Airway Mallampati: II  TM Distance: >3 FB Neck ROM: full    Dental no notable dental hx.    Pulmonary shortness of breath, asthma , sleep apnea , COPD, former smoker   Pulmonary exam normal breath sounds clear to auscultation       Cardiovascular Exercise Tolerance: Good hypertension, negative cardio ROS  Rhythm:regular Rate:Normal     Neuro/Psych  Headaches, Seizures -,  PSYCHIATRIC DISORDERS Anxiety Depression     Neuromuscular disease CVA    GI/Hepatic ,GERD  ,,(+) Hepatitis -  Endo/Other  negative endocrine ROS    Renal/GU negative Renal ROS  negative genitourinary   Musculoskeletal   Abdominal   Peds  Hematology  (+) Blood dyscrasia, anemia   Anesthesia Other Findings   Reproductive/Obstetrics negative OB ROS                             Anesthesia Physical Anesthesia Plan  ASA: 3 and emergent  Anesthesia Plan: General   Post-op Pain Management:    Induction:   PONV Risk Score and Plan: Propofol  infusion  Airway Management Planned:   Additional Equipment:   Intra-op Plan:   Post-operative Plan:   Informed Consent: I have reviewed the patients History and Physical, chart, labs and discussed the procedure including the risks, benefits and alternatives for the proposed anesthesia with the patient or authorized representative who has indicated his/her understanding and acceptance.     Dental Advisory Given  Plan Discussed with: CRNA  Anesthesia Plan Comments:        Anesthesia Quick Evaluation

## 2024-03-21 NOTE — Hospital Course (Signed)
 Soreness in LLQ and back. Same. Less severe.   Some nausea. Last vomit 1130 yesterday.

## 2024-03-21 NOTE — Plan of Care (Signed)
  Problem: Health Behavior/Discharge Planning: Goal: Ability to manage health-related needs will improve Outcome: Progressing   Problem: Clinical Measurements: Goal: Ability to maintain clinical measurements within normal limits will improve Outcome: Progressing Goal: Will remain free from infection Outcome: Progressing   Problem: Activity: Goal: Risk for activity intolerance will decrease Outcome: Progressing   Problem: Coping: Goal: Level of anxiety will decrease Outcome: Progressing   Problem: Pain Managment: Goal: General experience of comfort will improve and/or be controlled Outcome: Progressing   Problem: Safety: Goal: Ability to remain free from injury will improve Outcome: Progressing   Problem: Skin Integrity: Goal: Risk for impaired skin integrity will decrease Outcome: Progressing

## 2024-03-21 NOTE — Care Management Important Message (Signed)
 Important Message  Patient Details  Name: [name] MRN: [id number] Date of Birth: 03/06/1961   Important Message Given:  Yes - Medicare IM     Keylen Eckenrode L Lucianna Ostlund 03/21/2024, 2:39 PM

## 2024-03-21 NOTE — Progress Notes (Addendum)
 Progress Note   Patient: [name] EAV:[id number] DOB: 12/05/1960 DOA: 03/19/2024     2 DOS: the patient was seen and examined on 03/21/2024   Brief hospital admission narrative: [name] is a 63 y.o. female with medical history significant of asthma/COPD, depression, chronic pain syndrome, GERD, hyperlipidemia, restless leg syndrome, rheumatoid arthritis, history of CVA, seizure disorder, IBS with chronic constipation, restless leg syndrome and history of primary biliary cirrhosis who presented to the emergency department complaining of lower abdominal pain with associated nausea and vomiting.  Patient expressed difficulty keeping things down and maintaining adequate hydration.   There has not been fever, bloody emesis, chest pain, shortness of breath, focal weaknesses or sick contacts.   Of note, patient with recent hospitalization in mid May for similar symptoms where she was found to have diverticulitis and after being treated with antibiotics was discharged home with improvement in her symptoms and instruction to follow-up with gastroenterology as an outpatient.  Patient expressed that her symptoms has continue waxing and waning and definitely worse in the last 48 hours prior to admission.   In the ED basic metabolic panel demonstrated metabolic acidosis, hypokalemia and ALT of 46; white blood cells 15.8, hemoglobin 17.4 and platelet count 320K.  Magnesium  1.7 and lipase within normal limits.  Fluid resuscitation initiated, GI service consulted and TRH contacted to place patient in the hospital for further evaluation and management.  Assessment and plan 1-abdominal pain, nausea/vomiting - Continue bowel rest with clear liquid -Plan for colonoscopy today - Continue as needed analgesics, antiemetics and maintain adequate hydration.  Metabolic acidosis non anion gap     Latest Ref Rng & Units 03/21/2024   12:28 PM 03/21/2024    5:21 AM 03/20/2024    4:32 AM  BMP  Glucose 70 - 99  mg/dL 782  956  92   BUN 8 - 23 mg/dL <5  <5  <5   Creatinine 0.44 - 1.00 mg/dL 2.13  0.86  5.78   Sodium 135 - 145 mmol/L 140  142  143   Potassium 3.5 - 5.1 mmol/L 3.5  3.5  2.9   Chloride 98 - 111 mmol/L 115  114  118   CO2 22 - 32 mmol/L 14  14  18    Calcium  8.9 - 10.3 mg/dL 9.1  9.2  7.7    Stable this AM.  BOH mildly elevated, Normal renal function.  -Will give bolus of IV fluids and start 12 hours of MIVF -May need to start PO bicarb if remains low on AM labs   2-hypokalemia Resolved  - In the setting of GI losses - Continue electrolyte repletion.  3-GERD - Continue PPI  4-hypertension - Continue current antihypertensive agents - Follow vital signs - Discussion regarding low-sodium diet provided.  5-hyperlipidemia - Continue statin.  6-chronic pain syndrome/5 myalgia - Continue home analgesic therapy  7-anxiety/depression - No suicidal ideation hallucination - Stable mood - Continue home anxiolytic and antidepressant therapy.  8-leukocytosis (resolved  - In the setting of stress demargination and hemoconcentration from dehydration - After fluid resuscitation WBCs within normal limits - Continue to monitor noted - Patient is afebrile.  9-history of RA/history of PBC - Continue treatment with Urso  - Continue outpatient follow-up with rheumatology service and continue treatment with adalimumab.  Subjective:  Soreness in LLQ and back. This is chronic for patient, but she states it is less severe. Has some nausea, last episode of emesis was day prior.   Physical Exam: Vitals:  03/21/24 1615 03/21/24 1630 03/21/24 1643 03/21/24 1702  BP: 119/62  122/74 134/82  Pulse: 61 62 73 (!) 59  Resp: 12 10 15 16   Temp:    98.3 F (36.8 C)  TempSrc:    Oral  SpO2: 99% 99% 99% 100%  Weight:      Height:       Physical Exam  Constitutional: In no distress.  Cardiovascular: Normal rate, regular rhythm. No lower extremity edema  Pulmonary: Non labored breathing on  room air, no wheezing or rales.   Abdominal: Soft. Normal bowel sounds. Non distended. Mild TTP in LLQ Musculoskeletal: Normal range of motion.     Neurological: Alert and oriented to person, place, and time. Non focal  Skin: Skin is warm and dry.   Data Reviewed:    Latest Ref Rng & Units 03/21/2024   12:28 PM 03/21/2024    5:21 AM 03/20/2024    4:32 AM  BMP  Glucose 70 - 99 mg/dL 132  440  92   BUN 8 - 23 mg/dL <5  <5  <5   Creatinine 0.44 - 1.00 mg/dL 1.02  7.25  3.66   Sodium 135 - 145 mmol/L 140  142  143   Potassium 3.5 - 5.1 mmol/L 3.5  3.5  2.9   Chloride 98 - 111 mmol/L 115  114  118   CO2 22 - 32 mmol/L 14  14  18    Calcium  8.9 - 10.3 mg/dL 9.1  9.2  7.7       Latest Ref Rng & Units 03/20/2024    6:37 AM 03/19/2024   12:29 AM 02/29/2024    4:35 AM  CBC  WBC 4.0 - 10.5 K/uL 4.6  15.8  6.5   Hemoglobin 12.0 - 15.0 g/dL 44.0  34.7  42.5   Hematocrit 36.0 - 46.0 % 39.3  49.3  42.3   Platelets 150 - 400 K/uL 169  320  197      Family Communication: No family at bedside.  Disposition: Status is: Inpatient Remains inpatient appropriate because: Continue electrolytes repletion, IV therapy and GI workup.  Discharge back home once medically stable.  Time spent: 35 minutes  Author: Joette Mustard, MD 03/21/2024 5:35 PM  For on call review [email or web address].

## 2024-03-21 NOTE — Transfer of Care (Signed)
 Immediate Anesthesia Transfer of Care Note  Patient: [name]  Procedure(s) Performed: COLONOSCOPY EGD (ESOPHAGOGASTRODUODENOSCOPY)  Patient Location: PACU  Anesthesia Type:General  Level of Consciousness: drowsy and patient cooperative  Airway & Oxygen Therapy: Patient Spontanous Breathing and Patient connected to nasal cannula oxygen  Post-op Assessment: Report given to RN and Post -op Vital signs reviewed and stable  Post vital signs: Reviewed and stable  Last Vitals:  Vitals Value Taken Time  BP 110/78 03/21/24 1555  Temp    Pulse 80 03/21/24 1600  Resp 25 03/21/24 1600  SpO2 100 % 03/21/24 1600  Vitals shown include unfiled device data.  Last Pain:  Vitals:   03/21/24 1505  TempSrc:   PainSc: 4       Patients Stated Pain Goal: 3 (03/21/24 1407)  Complications: No notable events documented.

## 2024-03-22 DIAGNOSIS — K5900 Constipation, unspecified: Secondary | ICD-10-CM

## 2024-03-22 DIAGNOSIS — R197 Diarrhea, unspecified: Secondary | ICD-10-CM | POA: Diagnosis not present

## 2024-03-22 DIAGNOSIS — R112 Nausea with vomiting, unspecified: Secondary | ICD-10-CM | POA: Diagnosis not present

## 2024-03-22 DIAGNOSIS — R933 Abnormal findings on diagnostic imaging of other parts of digestive tract: Secondary | ICD-10-CM | POA: Diagnosis not present

## 2024-03-22 LAB — BASIC METABOLIC PANEL WITH GFR
Anion gap: 5 (ref 5–15)
BUN: <5 mg/dL — ABNORMAL LOW (ref 8–23)
CO2: 18 mmol/L — ABNORMAL LOW (ref 22–32)
Calcium: 8.7 mg/dL — ABNORMAL LOW (ref 8.9–10.3)
Chloride: 115 mmol/L — ABNORMAL HIGH (ref 98–111)
Creatinine, Ser: 0.45 mg/dL (ref 0.44–1.00)
GFR, Estimated: >60 mL/min (ref >60)
Glucose, Bld: 103 mg/dL — ABNORMAL HIGH (ref 70–99)
Potassium: 3.4 mmol/L — ABNORMAL LOW (ref 3.5–5.1)
Sodium: 138 mmol/L (ref 135–145)

## 2024-03-22 LAB — MAGNESIUM: Magnesium: 1.9 mg/dL (ref 1.7–2.4)

## 2024-03-22 LAB — PHOSPHORUS: Phosphorus: 3.1 mg/dL (ref 2.5–4.6)

## 2024-03-22 MED ORDER — DEXTROSE IN LACTATED RINGERS 5 % IV SOLN: INTRAVENOUS | Status: AC

## 2024-03-22 MED ORDER — POTASSIUM CHLORIDE CRYS ER 20 MEQ PO TBCR
40.0000 meq | EXTENDED_RELEASE_TABLET | ORAL | Status: AC
  Administered 2024-03-22 (×2): 40 meq via ORAL
  Filled 2024-03-22 (×2): qty 2

## 2024-03-22 NOTE — Progress Notes (Signed)
 Less abdominal pain and nausea.  Wants a "heart healthy" diet.  Vital signs in last 24 hours: Temp:  [97.5 F (36.4 C)-98.8 F (37.1 C)] 97.7 F (36.5 C) (06/07 0427) Pulse Rate:  [59-80] 79 (06/07 0427) Resp:  [10-28] 18 (06/07 0427) BP: (110-151)/(62-89) 149/82 (06/07 0427) SpO2:  [97 %-100 %] 97 % (06/07 0427) Last BM Date : 03/21/24 General:   Alert, comfortable no acute distress  Abdomen:  nondistended.  Positive bowel sounds; she does have suprapubic and left sided abdominal tenderness.  No mass.  No rebound. Intake/Output from previous day: 06/06 0701 - 06/07 0700 In: 1220 [P.O.:720; I.V.:500] Out: -  Intake/Output this shift: No intake/output data recorded.  Lab Results: Recent Labs    03/20/24 0637  WBC 4.6  HGB 13.6  HCT 39.3  PLT 169   BMET Recent Labs    03/21/24 0521 03/21/24 1228 03/22/24 0219  NA 142 140 138  K 3.5 3.5 3.4*  CL 114* 115* 115*  CO2 14* 14* 18*  GLUCOSE 142* 115* 103*  BUN <5* <5* <5*  CREATININE 0.50 0.34* 0.45  CALCIUM  9.2 9.1 8.7*   LFT No results for input(s): "PROT", "ALBUMIN", "AST", "ALT", "ALKPHOS", "BILITOT", "BILIDIR", "IBILI" in the last 72 hours. PT/INR No results for input(s): "LABPROT", "INR" in the last 72 hours. Hepatitis Panel No results for input(s): "HEPBSAG", "HCVAB", "HEPAIGM", "HEPBIGM" in the last 72 hours. C-Diff No results for input(s): "CDIFFTOX" in the last 72 hours.  Studies/Results: No results found.   Impression: Chronic recurrent nausea vomiting diarrhea, suggestion of colitis on CT with negative stool studies.  EGD yesterday demonstrated an abnormal appearing gastric mucosa of uncertain significance-biopsied.  No overt explanation for nausea and vomiting.  54 French Maloney dilator passed for chronic dysphagia (normal esophagus).  Colonoscopy revealed a subjectively edematous sigmoid and descending colon.  More proximal colon appeared normal.  Segmental biopsies performed.  At this time,  etiology of "colitis" is uncertain.  Stool studies negative previously.  No evidence of critical mesenteric ischemia on cross-sectional imaging.  States abdominal pain today is better than it was on admission.  Patient getting Linzess  for constipation although she has been having diarrhea.   Recommendations:  No reason not to advance diet.  Will advance to a heart healthy diet.  Discontinue Linzess   Continue PPI empirically.  Symptomatic treatment of any nausea symptoms.  Follow-up on pathology.

## 2024-03-22 NOTE — Progress Notes (Signed)
 Progress Note   Patient: [name] DOB: Apr 01, 1961 DOA: 03/19/2024     3 DOS: the patient was seen and examined on 03/22/2024   Brief hospital admission narrative: [name] is a 63 y.o. female with medical history significant of asthma/COPD, depression, chronic pain syndrome, GERD, hyperlipidemia, restless leg syndrome, rheumatoid arthritis, history of CVA, seizure disorder, IBS with chronic constipation, restless leg syndrome and history of primary biliary cirrhosis who presented to the emergency department complaining of lower abdominal pain with associated nausea and vomiting.  Patient expressed difficulty keeping things down and maintaining adequate hydration.   There has not been fever, bloody emesis, chest pain, shortness of breath, focal weaknesses or sick contacts.   Of note, patient with recent hospitalization in mid May for similar symptoms where she was found to have diverticulitis and after being treated with antibiotics was discharged home with improvement in her symptoms and instruction to follow-up with gastroenterology as an outpatient.  Patient expressed that her symptoms has continue waxing and waning and definitely worse in the last 48 hours prior to admission.   In the ED basic metabolic panel demonstrated metabolic acidosis, hypokalemia and ALT of 46; white blood cells 15.8, hemoglobin 17.4 and platelet count 320K.  Magnesium  1.7 and lipase within normal limits.  Fluid resuscitation initiated, GI service consulted and TRH contacted to place patient in the hospital for further evaluation and management.  Assessment and plan 1-abdominal pain, nausea/vomiting -Nausea improving  - EGD/colonoscopy 6/6, some abn appearing gastric mucosa, edematous sigmoid and descending colon. NO overt explanation to her symptoms per GI -Diet advanced by GI  -Continue PPI   Metabolic acidosis non anion gap     Latest Ref Rng & Units 03/22/2024    2:19 AM 03/21/2024    12:28 PM 03/21/2024    5:21 AM  BMP  Glucose 70 - 99 mg/dL 811  914  782   BUN 8 - 23 mg/dL <5  <5  <5   Creatinine 0.44 - 1.00 mg/dL 9.56  2.13  0.86   Sodium 135 - 145 mmol/L 138  140  142   Potassium 3.5 - 5.1 mmol/L 3.4  3.5  3.5   Chloride 98 - 111 mmol/L 115  115  114   CO2 22 - 32 mmol/L 18  14  14    Calcium  8.9 - 10.3 mg/dL 8.7  9.1  9.2    Improved with IV fluids -Continue to monitor   2-hypokalemia  - In the setting of GI losses - Continue electrolyte supplementation .  3-GERD - Continue PPI  4-hypertension - Continue current antihypertensive agents - Follow vital signs - Discussion regarding low-sodium diet provided.  5-hyperlipidemia - Continue statin.  6-chronic pain syndrome/5 myalgia - Continue home analgesic therapy  7-anxiety/depression - No suicidal ideation hallucination - Stable mood - Continue home anxiolytic and antidepressant therapy.  8-leukocytosis (resolved) - demargination v hemoconcentration - After fluid resuscitation WBCs within normal limits - Continue to monitor noted - Patient is afebrile.  9-history of RA/history of PBC - Continue treatment with Urso  - Continue outpatient follow-up with rheumatology service and continue treatment with adalimumab.  Subjective:  Continues to have some abdominal pain. This is not acutely worsened. She is eager to eat more solid food. Having some mild nausea.   Physical Exam: Vitals:   03/21/24 1702 03/21/24 2247 03/22/24 0427 03/22/24 1438  BP: 134/82 127/84 (!) 149/82 122/82  Pulse: (!) 59 80 79 90  Resp: 16 20 18  20  Temp: 98.3 F (36.8 C) 98.8 F (37.1 C) 97.7 F (36.5 C) 98.6 F (37 C)  TempSrc: Oral Oral Oral Oral  SpO2: 100% 99% 97% 98%  Weight:      Height:         Physical Exam  Constitutional: In no distress.  Cardiovascular: Normal rate, regular rhythm. No lower extremity edema  Pulmonary: Non labored breathing on room air, no wheezing or rales.   Abdominal: Soft. Mild TTP  L side of abdomen  Musculoskeletal: Normal range of motion.     Neurological: Alert and oriented to person, place, and time. Non focal  Skin: Skin is warm and dry.   Data Reviewed:    Latest Ref Rng & Units 03/22/2024    2:19 AM 03/21/2024   12:28 PM 03/21/2024    5:21 AM  BMP  Glucose 70 - 99 mg/dL 161  096  045   BUN 8 - 23 mg/dL <5  <5  <5   Creatinine 0.44 - 1.00 mg/dL 4.09  8.11  9.14   Sodium 135 - 145 mmol/L 138  140  142   Potassium 3.5 - 5.1 mmol/L 3.4  3.5  3.5   Chloride 98 - 111 mmol/L 115  115  114   CO2 22 - 32 mmol/L 18  14  14    Calcium  8.9 - 10.3 mg/dL 8.7  9.1  9.2       Latest Ref Rng & Units 03/20/2024    6:37 AM 03/19/2024   12:29 AM 02/29/2024    4:35 AM  CBC  WBC 4.0 - 10.5 K/uL 4.6  15.8  6.5   Hemoglobin 12.0 - 15.0 g/dL 78.2  95.6  21.3   Hematocrit 36.0 - 46.0 % 39.3  49.3  42.3   Platelets 150 - 400 K/uL 169  320  197      Family Communication: No family at bedside.  Disposition: Status is: Inpatient Remains inpatient appropriate because: Continue electrolytes repletion, IV therapy and GI workup.  Discharge back home once medically stable.  Time spent: 35 minutes  Author: Joette Mustard, MD 03/22/2024 5:12 PM  For on call review [email or web address].

## 2024-03-22 NOTE — Plan of Care (Signed)

## 2024-03-22 NOTE — Progress Notes (Signed)
 Mobility Specialist Progress Note:    03/22/24 1220  Mobility  Activity Ambulated with assistance in hallway  Level of Assistance Independent  Assistive Device None  Distance Ambulated (ft) 700 ft  Range of Motion/Exercises Active;All extremities  Activity Response Tolerated well  Mobility Referral Yes  Mobility visit 1 Mobility  Mobility Specialist Start Time (ACUTE ONLY) 1220  Mobility Specialist Stop Time (ACUTE ONLY) 1240  Mobility Specialist Time Calculation (min) (ACUTE ONLY) 20 min   Pt received in bed, agreeable to mobility. Independently able to stand and ambulate with no AD. Tolerated well,asx throughout. Left pt standing EOB, all needs met.  Brinson Tozzi Mobility Specialist Please contact via Special educational needs teacher or  Rehab office at [phone]

## 2024-03-22 NOTE — Plan of Care (Signed)
  Problem: Clinical Measurements: Goal: Ability to maintain clinical measurements within normal limits will improve Outcome: Progressing Goal: Will remain free from infection Outcome: Progressing Goal: Diagnostic test results will improve Outcome: Progressing   Problem: Activity: Goal: Risk for activity intolerance will decrease Outcome: Progressing   Problem: Coping: Goal: Level of anxiety will decrease Outcome: Progressing   Problem: Elimination: Goal: Will not experience complications related to bowel motility Outcome: Progressing Goal: Will not experience complications related to urinary retention Outcome: Progressing   Problem: Pain Managment: Goal: General experience of comfort will improve and/or be controlled Outcome: Progressing   Problem: Safety: Goal: Ability to remain free from injury will improve Outcome: Progressing   Problem: Skin Integrity: Goal: Risk for impaired skin integrity will decrease Outcome: Progressing

## 2024-03-23 DIAGNOSIS — R109 Unspecified abdominal pain: Secondary | ICD-10-CM | POA: Diagnosis not present

## 2024-03-23 DIAGNOSIS — R933 Abnormal findings on diagnostic imaging of other parts of digestive tract: Secondary | ICD-10-CM | POA: Diagnosis not present

## 2024-03-23 DIAGNOSIS — R112 Nausea with vomiting, unspecified: Secondary | ICD-10-CM | POA: Diagnosis not present

## 2024-03-23 LAB — BASIC METABOLIC PANEL WITH GFR
Anion gap: 6 (ref 5–15)
BUN: 6 mg/dL — ABNORMAL LOW (ref 8–23)
CO2: 20 mmol/L — ABNORMAL LOW (ref 22–32)
Calcium: 8.8 mg/dL — ABNORMAL LOW (ref 8.9–10.3)
Chloride: 113 mmol/L — ABNORMAL HIGH (ref 98–111)
Creatinine, Ser: 0.43 mg/dL — ABNORMAL LOW (ref 0.44–1.00)
GFR, Estimated: >60 mL/min (ref >60)
Glucose, Bld: 102 mg/dL — ABNORMAL HIGH (ref 70–99)
Potassium: 3.7 mmol/L (ref 3.5–5.1)
Sodium: 139 mmol/L (ref 135–145)

## 2024-03-23 LAB — MAGNESIUM: Magnesium: 1.8 mg/dL (ref 1.7–2.4)

## 2024-03-23 MED ORDER — POLYETHYLENE GLYCOL 3350 17 G PO PACK: 17.0000 g | PACK | Freq: Every day | ORAL | Status: DC | PRN

## 2024-03-23 MED ORDER — MAGNESIUM SULFATE 2 GM/50ML IV SOLN
2.0000 g | Freq: Once | INTRAVENOUS | Status: AC
  Administered 2024-03-23: 2 g via INTRAVENOUS
  Filled 2024-03-23: qty 50

## 2024-03-23 NOTE — Progress Notes (Signed)
 Progress Note   Patient: [name] ZOX:[id number] DOB: 03/15/1961 DOA: 03/19/2024     4 DOS: the patient was seen and examined on 03/23/2024   Brief hospital admission narrative: [name] is a 63 y.o. female with medical history significant of asthma/COPD, depression, chronic pain syndrome, GERD, hyperlipidemia, restless leg syndrome, rheumatoid arthritis, history of CVA, seizure disorder, IBS with chronic constipation, restless leg syndrome and history of primary biliary cirrhosis who presented to the emergency department complaining of lower abdominal pain with associated nausea and vomiting.  Patient expressed difficulty keeping things down and maintaining adequate hydration.   There has not been fever, bloody emesis, chest pain, shortness of breath, focal weaknesses or sick contacts.   Of note, patient with recent hospitalization in mid May for similar symptoms where she was found to have diverticulitis and after being treated with antibiotics was discharged home with improvement in her symptoms and instruction to follow-up with gastroenterology as an outpatient.  Patient expressed that her symptoms has continue waxing and waning and definitely worse in the last 48 hours prior to admission.   In the ED basic metabolic panel demonstrated metabolic acidosis, hypokalemia and ALT of 46; white blood cells 15.8, hemoglobin 17.4 and platelet count 320K.  Magnesium  1.7 and lipase within normal limits.  Fluid resuscitation initiated, GI service consulted and TRH contacted to place patient in the hospital for further evaluation and management.  Assessment and plan Abdominal pain, nausea/vomiting -Pain is stable, Nause is improving. No vomiting  - EGD/colonoscopy 6/6, some abn appearing gastric mucosa, edematous sigmoid and descending colon. NO overt explanation to her symptoms per GI -Diet advanced by GI  -Continue PPI   Metabolic acidosis non anion gap Resolving     Latest Ref Rng &  Units 03/23/2024    2:28 AM 03/22/2024    2:19 AM 03/21/2024   12:28 PM  BMP  Glucose 70 - 99 mg/dL 811  914  782   BUN 8 - 23 mg/dL 6  <5  <5   Creatinine 0.44 - 1.00 mg/dL 9.56  2.13  0.86   Sodium 135 - 145 mmol/L 139  138  140   Potassium 3.5 - 5.1 mmol/L 3.7  3.4  3.5   Chloride 98 - 111 mmol/L 113  115  115   CO2 22 - 32 mmol/L 20  18  14    Calcium  8.9 - 10.3 mg/dL 8.8  8.7  9.1    -Continue to monitor   hypokalemia  - WNL on am labs  - Continue electrolyte supplementation PRN .  GERD - Continue PPI  hypertension - Continue current antihypertensive agents - Follow vital signs   hyperlipidemia - Continue statin.  Chronic pain syndrome/5 myalgia - Continue home analgesic therapy  Anxiety/depression - No suicidal ideation hallucination - Stable mood - Continue home anxiolytic and antidepressant therapy.  leukocytosis (resolved) - demargination v hemoconcentration - After fluid resuscitation WBCs within normal limits - Continue to monitor noted - Patient is afebrile.  history of RA/history of primary biliary cholangitis  - Continue treatment with Urso  - Continue outpatient follow-up with rheumatology service and continue treatment with adalimumab.  Subjective:  Continues to have abdominal pain. Ate about a fourth a dinner,  pain prevented patient from eating more. She also got nauseous eating a hamburger. She ate chicken and dumplings and this did not cause signficant pain.   Physical Exam: Vitals:   03/22/24 0427 03/22/24 1438 03/22/24 1948 03/23/24 0521  BP: (!) 149/82 122/82  139/78 120/72  Pulse: 79 90 80 80  Resp: 18 20 14 14   Temp: 97.7 F (36.5 C) 98.6 F (37 C) 98.5 F (36.9 C)   TempSrc: Oral Oral Oral Oral  SpO2: 97% 98% 98% 96%  Weight:      Height:        Physical Exam  Constitutional: In no distress.  Cardiovascular: Normal rate, regular rhythm. No lower extremity edema  Pulmonary: Non labored breathing on room air, no wheezing or rales.   Abdominal: Soft. Normal bowel sounds. Non distended Mild TTP L side of abdomen unchanged  Musculoskeletal: Normal range of motion.     Neurological: Alert and oriented to person, place, and time. Non focal  Skin: Skin is warm and dry.    Data Reviewed:    Latest Ref Rng & Units 03/23/2024    2:28 AM 03/22/2024    2:19 AM 03/21/2024   12:28 PM  BMP  Glucose 70 - 99 mg/dL 657  846  962   BUN 8 - 23 mg/dL 6  <5  <5   Creatinine 0.44 - 1.00 mg/dL 9.52  8.41  3.24   Sodium 135 - 145 mmol/L 139  138  140   Potassium 3.5 - 5.1 mmol/L 3.7  3.4  3.5   Chloride 98 - 111 mmol/L 113  115  115   CO2 22 - 32 mmol/L 20  18  14    Calcium  8.9 - 10.3 mg/dL 8.8  8.7  9.1       Latest Ref Rng & Units 03/20/2024    6:37 AM 03/19/2024   12:29 AM 02/29/2024    4:35 AM  CBC  WBC 4.0 - 10.5 K/uL 4.6  15.8  6.5   Hemoglobin 12.0 - 15.0 g/dL 40.1  02.7  25.3   Hematocrit 36.0 - 46.0 % 39.3  49.3  42.3   Platelets 150 - 400 K/uL 169  320  197      Family Communication: No family at bedside.  Disposition: Status is: Inpatient Remains inpatient appropriate because: Continue electrolytes repletion, IV therapy and GI workup.  Discharge back home once medically stable.  Time spent: 35 minutes  Author: Joette Mustard, MD 03/23/2024 8:10 AM  For on call review [email or web address].

## 2024-03-23 NOTE — Plan of Care (Signed)

## 2024-03-23 NOTE — Progress Notes (Signed)
  Less abdominal pain; tolerating regular diet.  No bowel movement in 24 hours (Linzess  held) No vomiting.  Vital signs in last 24 hours: Temp:  [98.5 F (36.9 C)-98.6 F (37 C)] 98.5 F (36.9 C) (06/07 1948) Pulse Rate:  [80-90] 80 (06/08 0521) Resp:  [14-20] 14 (06/08 0521) BP: (120-139)/(72-82) 120/72 (06/08 0521) SpO2:  [96 %-98 %] 96 % (06/08 0521) Last BM Date : 03/23/24 General:   Alert,  Well-developed, well-nourished, pleasant and cooperative in NAD Abdomen: Nondistended.  Positive bowel sounds minimal left lower quadrant tenderness to palpation..    Intake/Output from previous day: 06/07 0701 - 06/08 0700 In: 1675.6 [P.O.:960; I.V.:715.6] Out: -  Intake/Output this shift: No intake/output data recorded.  Lab Results: No results for input(s): "WBC", "HGB", "HCT", "PLT" in the last 72 hours. BMET Recent Labs    03/21/24 1228 03/22/24 0219 03/23/24 0228  NA 140 138 139  K 3.5 3.4* 3.7  CL 115* 115* 113*  CO2 14* 18* 20*  GLUCOSE 115* 103* 102*  BUN <5* <5* 6*  CREATININE 0.34* 0.45 0.43*  CALCIUM  9.1 8.7* 8.8*   LFT No results for input(s): "PROT", "ALBUMIN", "AST", "ALT", "ALKPHOS", "BILITOT", "BILIDIR", "IBILI" in the last 72 hours. PT/INR No results for input(s): "LABPROT", "INR" in the last 72 hours. Hepatitis Panel No results for input(s): "HEPBSAG", "HCVAB", "HEPAIGM", "HEPBIGM" in the last 72 hours. C-Diff No results for input(s): "CDIFFTOX" in the last 72 hours.  Studies/Results: No results found.   Impression: 63 year old lady admitted to the hospital with abdominal pain CT concerning for ongoing colitis stool studies negative previously.  No evidence of ischemia on CTA abdomen.  Colonoscopy 2 days ago demonstrated a boggy edematous left colon otherwise unremarkable segmental biopsies taken. Associated symptoms of nausea and vomiting have resolved.  EGD 2 days ago demonstrated abnormal appearing gastric mucosa biopsied.  Normal esophagus  (empirically dilated).  Dysphagia resolved.  She is tolerating a regular diet at this time.  NSAIDs held due to recent diarrhea.  History of PBC-on Urso .  Recommendations:  Continue to hold Linzess ; may need restart in the near future depending on clinical course  Continue PPI empirically  Resume ursodiol  at discharge.  Follow-up on pathology (may not be back until 6/10).  If she continues to do well she may be able to go home tomorrow with continued outpatient follow-up.  At this point, we will discontinue Reglan ; continue Zofran  on an as-needed basis.

## 2024-03-24 ENCOUNTER — Encounter (HOSPITAL_COMMUNITY): Payer: Self-pay | Admitting: Internal Medicine

## 2024-03-24 ENCOUNTER — Telehealth: Payer: Self-pay | Admitting: Gastroenterology

## 2024-03-24 DIAGNOSIS — R112 Nausea with vomiting, unspecified: Secondary | ICD-10-CM | POA: Diagnosis not present

## 2024-03-24 LAB — BASIC METABOLIC PANEL WITH GFR
Anion gap: 9 (ref 5–15)
BUN: 8 mg/dL (ref 8–23)
CO2: 19 mmol/L — ABNORMAL LOW (ref 22–32)
Calcium: 9 mg/dL (ref 8.9–10.3)
Chloride: 112 mmol/L — ABNORMAL HIGH (ref 98–111)
Creatinine, Ser: 0.42 mg/dL — ABNORMAL LOW (ref 0.44–1.00)
GFR, Estimated: >60 mL/min (ref >60)
Glucose, Bld: 84 mg/dL (ref 70–99)
Potassium: 3.7 mmol/L (ref 3.5–5.1)
Sodium: 140 mmol/L (ref 135–145)

## 2024-03-24 LAB — MAGNESIUM: Magnesium: 2.2 mg/dL (ref 1.7–2.4)

## 2024-03-24 NOTE — Care Management Important Message (Signed)
 Important Message  Patient Details  Name: [name] MRN: [id number] Date of Birth: 09-01-1961   Important Message Given:  Yes - Medicare IM     Alaisha Eversley L Hillery Zachman 03/24/2024, 11:09 AM

## 2024-03-24 NOTE — Progress Notes (Signed)
 Chart reviewed. Discharge orders have been placed by hospitalist. Agree with discharge on PPI daily empirically. She has an appointment in August with GI; we will move this up sooner. Our office will contact patient.

## 2024-03-24 NOTE — Discharge Summary (Signed)
 Physician Discharge Summary  [name]:[id number] DOB: [birth year] DOA: 03/19/2024  PCP: Lory Rough., PA-C  Admit date: 03/19/2024  Discharge date: 03/24/2024  Admitted From:Home  Disposition:  Home  Recommendations for Outpatient Follow-up:  Follow up with PCP in 1-2 weeks Follow-up with GI as scheduled on 8/25 Continue medications as prior with discontinuation of Linzess , continue PPI  Home Health: None  Equipment/Devices: None  Discharge Condition:Stable  CODE STATUS: Full  Diet recommendation: Heart Healthy  Brief/Interim Summary: [name] is a 63 y.o. female with medical history significant of asthma/COPD, depression, chronic pain syndrome, GERD, hyperlipidemia, restless leg syndrome, rheumatoid arthritis, history of CVA, seizure disorder, IBS with chronic constipation, restless leg syndrome and history of primary biliary cirrhosis who presented to the emergency department complaining of lower abdominal pain with associated nausea and vomiting.  Patient expressed difficulty keeping things down and maintaining adequate hydration.   There has not been fever, bloody emesis, chest pain, shortness of breath, focal weaknesses or sick contacts.   Of note, patient with recent hospitalization in mid May for similar symptoms where she was found to have diverticulitis and after being treated with antibiotics was discharged home with improvement in her symptoms and instruction to follow-up with gastroenterology as an outpatient.  Patient expressed that her symptoms has continue waxing and waning and definitely worse in the last 48 hours prior to admission.  She was admitted for management of her acute on chronic symptomatology and was seen by GI with recommendations to discharge once tolerating diet and she is at this point.  She had EGD/colonoscopy 6/6 with no overt explanations to explain her GI symptoms.  She plans to follow-up with GI outpatient and has good follow-up  arranged.  She will remain on symptomatic medications as prescribed.  No other acute events or concerns noted during the course of this admission.  Discharge Diagnoses:  Principal Problem:   Intractable nausea and vomiting Active Problems:   Abnormal CT of the abdomen   Dehydration   Hypokalemia   Nausea and vomiting  Principal discharge diagnosis: Abdominal pain with intractable nausea and vomiting with dysphagia-recurrent.  Discharge Instructions  Discharge Instructions     Diet - low sodium heart healthy   Complete by: As directed    Increase activity slowly   Complete by: As directed       Allergies as of 03/24/2024       Reactions   Aquacel [carboxymethylcellulose] Other (See Comments)   Blisters   Dextromethorphan Anaphylaxis   Nickel Rash   Turn read   Sulfonamide Derivatives Anaphylaxis, Hives, Swelling   Swelling of tongue   Aspirin  Nausea And Vomiting   Severe GI upset.  Pt states she can tolerate 81 mg asa only.    Ciprofloxacin Itching, Swelling   Angioedema, urticaria   Cosyntropin Hives, Swelling   Amoxicillin  Nausea And Vomiting   GI upset   Diclofenac Sodium Dermatitis   Gel causes legs to break out   Latex Rash   Blisters   Moxifloxacin Other (See Comments)   GI upset   Nsaids Other (See Comments)   Gi upset   Tape Other (See Comments), Dermatitis   Blisters skin        Medication List     STOP taking these medications    amoxicillin -clavulanate 875-125 MG tablet Commonly known as: AUGMENTIN    linaclotide  145 MCG Caps capsule Commonly known as: LINZESS        TAKE these medications    albuterol   108 (90 Base) MCG/ACT inhaler Commonly known as: VENTOLIN  HFA Inhale 1-2 puffs into the lungs every 6 (six) hours as needed for wheezing or shortness of breath.   ALPRAZolam  0.5 MG tablet Commonly known as: XANAX  Take 0.5 mg by mouth at bedtime.   amLODipine  5 MG tablet Commonly known as: NORVASC  TAKE 1 TABLET BY MOUTH DAILY    aspirin  EC 81 MG tablet Take 81 mg by mouth at bedtime.   azelastine 0.05 % ophthalmic solution Commonly known as: OPTIVAR Place 1 drop into both eyes 2 (two) times daily.   dicyclomine  10 MG capsule Commonly known as: BENTYL  Take 1 capsule (10 mg total) by mouth 4 (four) times daily -  before meals and at bedtime.   hyoscyamine  0.125 MG SL tablet Commonly known as: LEVSIN SL Place 1 tablet (0.125 mg total) under the tongue every 6 (six) hours as needed.   ipratropium 0.03 % nasal spray Commonly known as: ATROVENT  Place 2 sprays into the nose in the morning and at bedtime.   leflunomide 10 MG tablet Commonly known as: ARAVA Take 1 tablet by mouth daily.   Na Sulfate-K Sulfate-Mg Sulfate concentrate 17.5-3.13-1.6 GM/177ML Soln Commonly known as: SUPREP As directed   nitroGLYCERIN  0.4 MG SL tablet Commonly known as: NITROSTAT  Place 1 tablet (0.4 mg total) under the tongue every 5 (five) minutes as needed for chest pain.   ondansetron  4 MG tablet Commonly known as: Zofran  Take 1 tablet (4 mg total) by mouth daily as needed for nausea or vomiting.   OVER THE COUNTER MEDICATION Bio True eye drops daily.   pantoprazole  40 MG tablet Commonly known as: PROTONIX  TAKE 1 TABLET BY MOUTH DAILY   phentermine  15 MG capsule Take 15 mg by mouth daily.   pravastatin  40 MG tablet Commonly known as: PRAVACHOL  Take 40 mg by mouth daily.   PRESCRIPTION MEDICATION In Cholesterol study. Has injection once a month.has been in study for 3 years as of 12/07/23 and has 6 months to left in study. ( Will finish around end of August 2025 )   rizatriptan 5 MG tablet Commonly known as: MAXALT Take 5 mg by mouth as needed for migraine. (not more than 3 a week). May repeat in 2 hours if needed   tiZANidine 4 MG tablet Commonly known as: ZANAFLEX Take 1 tablet by mouth every 6 (six) hours as needed for muscle spasms.   topiramate  50 MG tablet Commonly known as: TOPAMAX  Take 50-100 mg by  mouth 2 (two) times daily. 50 mg am and 100 mg pm   ursodiol  300 MG capsule Commonly known as: ACTIGALL  Take 1 capsule (300 mg total) by mouth 3 (three) times daily.        Follow-up Information     Lory Rough., PA-C. Schedule an appointment as soon as possible for a visit in 1 week(s).   Specialty: Family Medicine Contact information: [address] [place] [address] [phone]         [facility]. Go on 06/09/2024.   Contact information: [address] [place]  [phone]               Allergies  Allergen Reactions   Aquacel [Carboxymethylcellulose] Other (See Comments)    Blisters    Dextromethorphan Anaphylaxis   Nickel Rash    Turn read   Sulfonamide Derivatives Anaphylaxis, Hives and Swelling    Swelling of tongue   Aspirin  Nausea And Vomiting    Severe GI upset.  Pt states she can tolerate 81 mg asa only.    Ciprofloxacin Itching and Swelling    Angioedema, urticaria   Cosyntropin Hives and Swelling   Amoxicillin  Nausea And Vomiting    GI upset   Diclofenac Sodium Dermatitis    Gel causes legs to break out   Latex Rash    Blisters   Moxifloxacin Other (See Comments)    GI upset   Nsaids Other (See Comments)    Gi upset   Tape Other (See Comments) and Dermatitis    Blisters skin    Consultations: GI   Procedures/Studies: DG Abdomen Acute W/Chest Result Date: 03/19/2024 CLINICAL DATA:  Abdominal pain, vomiting EXAM: DG ABDOMEN ACUTE WITH 1 VIEW CHEST COMPARISON:  None Available. FINDINGS: There is no evidence of dilated bowel loops or free intraperitoneal air. Umbilical hernia repair with mesh has been performed. No radiopaque calculi or other significant radiographic abnormality is seen. Heart size and mediastinal contours are within normal limits. Both lungs are clear. IMPRESSION: 1. Negative abdominal radiographs. No acute cardiopulmonary disease. Electronically Signed    By: Worthy Heads M.D.   On: 03/19/2024 00:57   CT Angio Abd/Pel w/ and/or w/o Result Date: 03/14/2024 CLINICAL DATA:  Recent colitis EXAM: CTA ABDOMEN AND PELVIS WITHOUT AND WITH CONTRAST TECHNIQUE: Multidetector CT imaging of the abdomen and pelvis was performed using the standard protocol during bolus administration of intravenous contrast. Multiplanar reconstructed images and MIPs were obtained and reviewed to evaluate the vascular anatomy. RADIATION DOSE REDUCTION: This exam was performed according to the departmental dose-optimization program which includes automated exposure control, adjustment of the mA and/or kV according to patient size and/or use of iterative reconstruction technique. CONTRAST:  OMNIPAQUE  IOHEXOL  350 MG/ML SOLN COMPARISON:  02/26/2024 FINDINGS: VASCULAR Normal contour and caliber of the abdominal aorta. No evidence of aneurysm, dissection, or other acute aortic pathology. Standard branching pattern of the abdominal aorta with solitary bilateral renal arteries. Moderate mixed calcific atherosclerosis. Minimal noncalcific narrowing of the origin of the superior mesenteric artery with less than 50% stenosis (series 11, image 109). Review of the MIP images confirms the above findings. NON-VASCULAR Lower Chest: No acute findings. Hepatobiliary: No solid liver abnormality is seen. Hepatic steatosis. No gallstones, gallbladder wall thickening, or biliary dilatation. Pancreas: Unremarkable. No pancreatic ductal dilatation or surrounding inflammatory changes. Spleen: Normal in size without significant abnormality. Adrenals/Urinary Tract: Adrenal glands are unremarkable. Small nonobstructive calculi of the inferior pole of the left kidney. No right-sided calculi, ureteral calculi, or hydronephrosis. Bladder is unremarkable. Stomach/Bowel: Stomach is within normal limits. Appendix not clearly visualized. Sigmoid diverticulosis. The descending colon, sigmoid colon, and rectum are  decompressed although there is mild mucosal hyperenhancement. Colon is fluid-filled. Lymphatic: No enlarged abdominal or pelvic lymph nodes. Reproductive: Status post hysterectomy. Other: No abdominal wall hernia or abnormality. No ascites. Musculoskeletal: No acute osseous findings. IMPRESSION: 1. Normal contour and caliber of the abdominal aorta. No evidence of aneurysm, dissection, or other acute aortic pathology. Moderate mixed calcific atherosclerosis. 2. Minimal noncalcific narrowing of the origin of the superior mesenteric artery with less than 50% stenosis. The vessel remains patent and opacified through its proximal order branches. 3. The descending colon, sigmoid colon, and rectum are decompressed although there is mild mucosal hyperenhancement. Colon is fluid-filled. Findings are consistent with nonspecific colitis. 4. Sigmoid diverticulosis without specific evidence of acute diverticulitis. 5. Hepatic steatosis. 6. Nonobstructive left nephrolithiasis 7. Status post hysterectomy. Aortic Atherosclerosis (ICD10-I70.0). Electronically Signed   By: Evonne Hoist.D.  On: 03/14/2024 17:14   CT ABDOMEN PELVIS W CONTRAST Result Date: 02/26/2024 CLINICAL DATA:  Acute non localized abdominal pain. EXAM: CT ABDOMEN AND PELVIS WITH CONTRAST TECHNIQUE: Multidetector CT imaging of the abdomen and pelvis was performed using the standard protocol following bolus administration of intravenous contrast. RADIATION DOSE REDUCTION: This exam was performed according to the departmental dose-optimization program which includes automated exposure control, adjustment of the mA and/or kV according to patient size and/or use of iterative reconstruction technique. CONTRAST:  OMNIPAQUE  IOHEXOL  300 MG/ML  SOLN COMPARISON:  12/06/2023 FINDINGS: Lower Chest: No acute findings. Hepatobiliary: No suspicious hepatic masses identified. Prior cholecystectomy. No evidence of biliary obstruction. Pancreas:  No mass or inflammatory  changes. Spleen: Within normal limits in size and appearance. Adrenals/Urinary Tract: No suspicious masses identified. Several 2-3 mm nonobstructing calculi are again seen in the lower pole of the left kidney. No evidence of ureteral calculi or hydronephrosis. Unremarkable unopacified urinary bladder. Stomach/Bowel: Mild wall thickening is seen involving the splenic flexure and descending colon, consistent with colitis. Sigmoid diverticulosis is noted, without signs of diverticulitis in this region. No evidence of bowel obstruction or abnormal fluid collections. Vascular/Lymphatic: No pathologically enlarged lymph nodes. No acute vascular findings. Reproductive: Prior hysterectomy noted. Adnexal regions are unremarkable in appearance. Other: Surgical mesh again seen within the lower anterior abdominal wall, without evidence of recurrent hernia. Musculoskeletal:  No suspicious bone lesions identified. IMPRESSION: Mild colitis involving the splenic flexure and descending colon. Sigmoid diverticulosis, without radiographic evidence of diverticulitis. Tiny nonobstructing left renal calculi. No evidence of ureteral calculi or hydronephrosis. Electronically Signed   By: Marlyce Sine M.D.   On: 02/26/2024 13:15     Discharge Exam: Vitals:   03/24/24 0554 03/24/24 0918  BP: 123/67 131/80  Pulse: 60   Resp:    Temp: (!) 97.5 F (36.4 C)   SpO2: 97%    Vitals:   03/23/24 1456 03/23/24 2107 03/24/24 0554 03/24/24 0918  BP: 129/79 123/79 123/67 131/80  Pulse: 88 77 60   Resp: 19 20    Temp: 98.6 F (37 C) 97.8 F (36.6 C) (!) 97.5 F (36.4 C)   TempSrc:  Oral Oral   SpO2: 98% 96% 97%   Weight:      Height:        General: Pt is alert, awake, not in acute distress Cardiovascular: RRR, S1/S2 +, no rubs, no gallops Respiratory: CTA bilaterally, no wheezing, no rhonchi Abdominal: Soft, NT, ND, bowel sounds + Extremities: no edema, no cyanosis    The results of significant diagnostics from this  hospitalization (including imaging, microbiology, ancillary and laboratory) are listed below for reference.     Microbiology: No results found for this or any previous visit (from the past 240 hours).   Labs: BNP (last 3 results) No results for input(s): "BNP" in the last 8760 hours. Basic Metabolic Panel: Recent Labs  Lab 03/19/24 0615 03/20/24 0432 03/21/24 0521 03/21/24 7829 03/21/24 1228 03/22/24 0219 03/23/24 0228 03/24/24 0221  NA  --    < > 142  --  140 138 139 140  K  --    < > 3.5  --  3.5 3.4* 3.7 3.7  CL  --    < > 114*  --  115* 115* 113* 112*  CO2  --    < > 14*  --  14* 18* 20* 19*  GLUCOSE  --    < > 142*  --  115* 103* 102* 84  BUN  --    < > <5*  --  <5* <5* 6* 8  CREATININE  --    < > 0.50  --  0.34* 0.45 0.43* 0.42*  CALCIUM   --    < > 9.2  --  9.1 8.7* 8.8* 9.0  MG 2.0  --   --  2.0  --  1.9 1.8 2.2  PHOS 2.8  --   --   --   --  3.1  --   --    < > = values in this interval not displayed.   Liver Function Tests: Recent Labs  Lab 03/19/24 0029  AST 39  ALT 46*  ALKPHOS 96  BILITOT 1.4*  PROT 8.0  ALBUMIN 4.6   Recent Labs  Lab 03/19/24 0029  LIPASE 30   No results for input(s): "AMMONIA" in the last 168 hours. CBC: Recent Labs  Lab 03/19/24 0029 03/20/24 0637  WBC 15.8* 4.6  NEUTROABS 12.7*  --   HGB 17.4* 13.6  HCT 49.3* 39.3  MCV 92.1 94.9  PLT 320 169   Cardiac Enzymes: No results for input(s): "CKTOTAL", "CKMB", "CKMBINDEX", "TROPONINI" in the last 168 hours. BNP: Invalid input(s): "POCBNP" CBG: No results for input(s): "GLUCAP" in the last 168 hours. D-Dimer No results for input(s): "DDIMER" in the last 72 hours. Hgb A1c No results for input(s): "HGBA1C" in the last 72 hours. Lipid Profile No results for input(s): "CHOL", "HDL", "LDLCALC", "TRIG", "CHOLHDL", "LDLDIRECT" in the last 72 hours. Thyroid  function studies No results for input(s): "TSH", "T4TOTAL", "T3FREE", "THYROIDAB" in the last 72 hours.  Invalid  input(s): "FREET3" Anemia work up No results for input(s): "VITAMINB12", "FOLATE", "FERRITIN", "TIBC", "IRON", "RETICCTPCT" in the last 72 hours. Urinalysis    Component Value Date/Time   COLORURINE YELLOW 09/20/2014 1206   APPEARANCEUR CLOUDY (A) 09/20/2014 1206   LABSPEC 1.015 09/20/2014 1206   PHURINE 7.5 09/20/2014 1206   GLUCOSEU NEGATIVE 09/20/2014 1206   HGBUR LARGE (A) 09/20/2014 1206   BILIRUBINUR NEGATIVE 09/20/2014 1206   KETONESUR TRACE (A) 09/20/2014 1206   PROTEINUR NEGATIVE 09/20/2014 1206   UROBILINOGEN 0.2 09/20/2014 1206   NITRITE NEGATIVE 09/20/2014 1206   LEUKOCYTESUR TRACE (A) 09/20/2014 1206   Sepsis Labs Recent Labs  Lab 03/19/24 0029 03/20/24 0637  WBC 15.8* 4.6   Microbiology No results found for this or any previous visit (from the past 240 hours).   Time coordinating discharge: 35 minutes  SIGNED:   Cornelius Dill, DO [facility] 03/24/2024, 10:07 AM  If 7PM-7AM, please contact night-coverage [email or web address]

## 2024-03-24 NOTE — Telephone Encounter (Signed)
 [name]:  Patient is being discharged today from hospital. She has an appt with [facility] already in August. We need to move this up and have a 4 week follow-up with us . Please put with [facility] if space as she is known to her. She is a patient of Dr. Alita Irwin, so follow-up could be there as well. If no openings with either one, she was also seen by [facility] inpatient.

## 2024-03-26 ENCOUNTER — Ambulatory Visit: Payer: Self-pay | Admitting: Internal Medicine

## 2024-03-26 LAB — SURGICAL PATHOLOGY

## 2024-03-31 ENCOUNTER — Other Ambulatory Visit: Payer: Self-pay | Admitting: Cardiology

## 2024-03-31 ENCOUNTER — Ambulatory Visit: Admitting: Nutrition

## 2024-03-31 DIAGNOSIS — I10 Essential (primary) hypertension: Secondary | ICD-10-CM

## 2024-04-11 NOTE — Progress Notes (Unsigned)
 Cardiology Clinic Note   Patient Name: [name] Date of Encounter: 04/14/2024  Primary Care Provider:  Debrah Josette ORN., PA-C Primary Cardiologist:  None  Patient Profile    [name] 63 year old female presents to the clinic today for follow-up evaluation of her chest pain and palpitations.   Past Medical History    Past Medical History:  Diagnosis Date   Allergic rhinitis    uses Flonase  daily as needed and takes CLaritin  daily   Anxiety    takes Xanax  daily as needed   Asthma    Albuterol  inhaler prn;SYmbicort  daily   Cerebral vascular malformation    sees dr mavis for monitoring as needed, sees dr lewitt for headaches every 4 months   COPD (chronic obstructive pulmonary disease) (HCC)    Depression    takes Citalopram  daily   Diverticulitis at age 30   Eczema    Fibromyalgia    GERD (gastroesophageal reflux disease)    takes Protonix  daily   Hard of hearing    History of kidney stones    History of migraine    last one 10+yrs ago   History of staph infection 66yrs ago   Hyperlipidemia    takes Pravastatin  daily   IBS (irritable bowel syndrome)    mixed   Insomnia    Joint pain    Lactose intolerance    Nausea    takes Zofran  daily as needed   PFO (patent foramen ovale)    Plaque psoriasis    PONV (postoperative nausea and vomiting)    Postoperative anemia due to acute blood loss 02/08/2016   Primary localized osteoarthritis of left knee    Primary localized osteoarthritis of right knee 11/03/2015   Rheumatoid arthritis(714.0) 10/16/2008   oa and ra;Rhemicade IV every 6wks and Metotrexate weekly   Seizures (HCC) 6months ago 03/21/14   takes Depakote  daily   Shortness of breath    with exertion   Sleep apnea    study done >77yrs ago;uses CPAP nightly   Spinal headache    patient states that she thinks she had a spinal headache a long time ago   Stress incontinence    Stroke (HCC) 03/26/2013   left sided weakness   Thyroid  cyst     Past Surgical History:  Procedure Laterality Date   APPENDECTOMY  1981   BIOPSY  12/16/2019   Procedure: BIOPSY;  Surgeon: Kristie Lamprey, MD;  Location: [facility];  Service: Endoscopy;;   BIOPSY  07/19/2023   Procedure: BIOPSY;  Surgeon: Cinderella Deatrice FALCON, MD;  Location: [facility];  Service: Endoscopy;;   CARDIAC CATHETERIZATION  2004   CHOLECYSTECTOMY  1981   COLONOSCOPY     COLONOSCOPY N/A 03/21/2024   Procedure: COLONOSCOPY;  Surgeon: Shaaron Lamar HERO, MD;  [facility]: [facility];  Service: Endoscopy;  Laterality: N/A;   COLONOSCOPY WITH PROPOFOL  N/A 12/16/2019   Procedure: COLONOSCOPY WITH PROPOFOL ;  Surgeon: Kristie Lamprey, MD;  Location: [facility];  Service: Endoscopy;  Laterality: N/A;   CORONARY PRESSURE/FFR STUDY N/A 07/25/2022   Procedure: INTRAVASCULAR PRESSURE WIRE/FFR STUDY;  Surgeon: Elmira Newman PARAS, MD;  [facility]: [facility];  Service: Cardiovascular;  Laterality: N/A;   ESOPHAGOGASTRODUODENOSCOPY     ESOPHAGOGASTRODUODENOSCOPY N/A 03/21/2024   Procedure: EGD (ESOPHAGOGASTRODUODENOSCOPY);  Surgeon: Shaaron Lamar HERO, MD;  [facility];  Service: Endoscopy;  Laterality: N/A;   ESOPHAGOGASTRODUODENOSCOPY (EGD) WITH PROPOFOL  N/A 07/19/2023   Procedure: ESOPHAGOGASTRODUODENOSCOPY (EGD) WITH PROPOFOL ;  Surgeon: Cinderella,  Deatrice FALCON, MD;  [facility];  Service: Endoscopy;  Laterality: N/A;  12:45;asa 1-2, bumped to 1:00 for lunch to fit - messaged Tanya   FOOT SURGERY  1999   right ankle   HERNIA REPAIR  2006   x2   INCISIONAL HERNIA REPAIR  05/20/2012   Procedure: HERNIA REPAIR INCISIONAL;  Surgeon: Debby LABOR. Cornett, MD;  Location: [facility];  Service: General;  Laterality: N/A;   INCONTINENCE SURGERY  2010   sling done    KIDNEY STONE SURGERY  2001   KNEE ARTHROSCOPY  1992   left   left foot plating and scarping for arthritis  2011   LEFT HEART CATH AND CORONARY ANGIOGRAPHY N/A 07/25/2022   Procedure: LEFT HEART CATH AND CORONARY ANGIOGRAPHY;   Surgeon: Elmira Newman PARAS, MD;  [facility];  Service: Cardiovascular;  Laterality: N/A;   POLYPECTOMY  07/19/2023   Procedure: POLYPECTOMY;  Surgeon: Cinderella Deatrice FALCON, MD;  [facility];  Service: Endoscopy;;   right ear tube insertion  2011   TEE WITHOUT CARDIOVERSION N/A 05/15/2013   Procedure: TRANSESOPHAGEAL ECHOCARDIOGRAM (TEE);  Surgeon: Erick JONELLE Bergamo, MD;  Location: [facility] ENDOSCOPY;  Service: Cardiovascular;  Laterality: N/A;   thryoid biopsy     TONSILLECTOMY AND ADENOIDECTOMY  04/01/2014   TONSILLECTOMY AND ADENOIDECTOMY Bilateral 04/01/2014   Procedure: BILATERAL TONSILLECTOMY AND ADENOIDECTOMY;  Surgeon: Ana LELON Moccasin, MD;  Location: [facility] OR;  Service: ENT;  Laterality: Bilateral;   TOTAL ABDOMINAL HYSTERECTOMY  2001   TOTAL KNEE ARTHROPLASTY Right 11/03/2015   TOTAL KNEE ARTHROPLASTY Right 11/03/2015   Procedure: RIGHT TOTAL KNEE ARTHROPLASTY/RIGHT;  Surgeon: Lamar Millman, MD;  Location: [facility];  Service: Orthopedics;  Laterality: Right;   TOTAL KNEE ARTHROPLASTY Left 02/07/2016   Procedure: TOTAL KNEE ARTHROPLASTY;  Surgeon: Lamar Millman, MD;  Location: [facility] OR;  Service: Orthopedics;  Laterality: Left;   TUBAL LIGATION  1990    Allergies  Allergies  Allergen Reactions   Aquacel [Carboxymethylcellulose] Other (See Comments)    Blisters    Dextromethorphan Anaphylaxis   Nickel Rash    Turn read   Sulfonamide Derivatives Anaphylaxis, Hives and Swelling    Swelling of tongue   Aspirin  Nausea And Vomiting    Severe GI upset.  Pt states she can tolerate 81 mg asa only.    Ciprofloxacin Itching and Swelling    Angioedema, urticaria   Cosyntropin Hives and Swelling   Amoxicillin  Nausea And Vomiting    GI upset   Diclofenac Sodium Dermatitis    Gel causes legs to break out   Latex Rash    Blisters   Moxifloxacin Other (See Comments)    GI upset   Nsaids Other (See Comments)    Gi upset   Tape Other (See Comments) and Dermatitis    Blisters  skin    History of Present Illness    [name] has a PMH of embolic stroke 6/14, PFO with no CVA recurrence.  Her PMH also includes psoriatic arthritis, rheumatoid arthritis,  nephrolithiasis, cerebrovascular malformations which are felt to be small, and cardioembolic CVA 6/24.Randomized to medical arm in the Reduce secondary prevention of stroke trial with PFO. No recurrence of stroke since.   She also has OSA and is on CPAP.  She was diagnosed with COPD.  She has a 40+ year of smoking cigarettes and is followed by Dr. Reggy Salt.   She was seen in follow-up by Dr. Bergamo on 07/03/2022.  During  that time she reported that over the prior month she noticed decreased exercise capacity and exertional chest pain.  She was taking nitroglycerin  which provided relief of her chest pain.  She reported pain from the left side of her chest to her left shoulder and increased dyspnea.  She denied PND and orthopnea.  She  denied lower extremity swelling.  She had no new neurologic deficits.  She denied fever and chills.  She underwent LHC which showed normal coronary anatomy and no plaque.   She presents to the clinic today for follow-up evaluation and states she is doing well.  She remains stable from a cardiac standpoint.  She has had some flares with her colitis.  She was hospitalized for this and her medications were changed.  Her blood pressure today is 116/88.  Her EKG shows sinus rhythm with PACs.  We reviewed her cardiac history and previous cardiac clinic visit.  I will refill her amlodipine  and plan follow-up for 9 to 12 months.   Today she denies  lower extremity edema, fatigue, palpitations, melena, hematuria, hemoptysis, diaphoresis, weakness, presyncope, syncope, orthopnea, and PND.    Home Medications    Prior to Admission medications   Medication Sig Start Date End Date Taking? Authorizing Provider  albuterol  (VENTOLIN  HFA) 108 (90 Base) MCG/ACT inhaler Inhale 1-2 puffs into the lungs  every 6 (six) hours as needed for wheezing or shortness of breath.    [provider]  ALPRAZolam  (XANAX ) 0.5 MG tablet Take 0.5 mg by mouth at bedtime. 12/29/20   [provider]  amLODipine  (NORVASC ) 5 MG tablet TAKE 1 TABLET BY MOUTH DAILY 03/13/24   Ladona Heinz, MD  aspirin  EC 81 MG tablet Take 81 mg by mouth at bedtime.    [provider]  azelastine (OPTIVAR) 0.05 % ophthalmic solution Place 1 drop into both eyes 2 (two) times daily.    [provider]  dicyclomine  (BENTYL ) 10 MG capsule Take 1 capsule (10 mg total) by mouth 4 (four) times daily -  before meals and at bedtime. Patient not taking: Reported on 03/19/2024 02/29/24 03/30/24  Maree Bracken D, DO  hyoscyamine  (LEVSIN  SL) 0.125 MG SL tablet Place 1 tablet (0.125 mg total) under the tongue every 6 (six) hours as needed. 03/13/24   Carlan, Chelsea L, NP  ipratropium (ATROVENT ) 0.03 % nasal spray Place 2 sprays into the nose in the morning and at bedtime. 05/18/21   [provider]  leflunomide (ARAVA) 10 MG tablet Take 1 tablet by mouth daily. 03/07/24   [provider]  Na Sulfate-K Sulfate-Mg Sulfate concentrate (SUPREP) 17.5-3.13-1.6 GM/177ML SOLN As directed 03/14/24   Ahmed, Muhammad F, MD  nitroGLYCERIN  (NITROSTAT ) 0.4 MG SL tablet Place 1 tablet (0.4 mg total) under the tongue every 5 (five) minutes as needed for chest pain. 03/13/23 03/13/24  Ladona Heinz, MD  ondansetron  (ZOFRAN ) 4 MG tablet Take 1 tablet (4 mg total) by mouth daily as needed for nausea or vomiting. 02/29/24 02/28/25  Maree, Pratik D, DO  OVER THE COUNTER MEDICATION Bio True eye drops daily.    [provider]  pantoprazole  (PROTONIX ) 40 MG tablet TAKE 1 TABLET BY MOUTH DAILY 02/06/24   Ahmed, Deatrice FALCON, MD  phentermine  15 MG capsule Take 15 mg by mouth daily.    [provider]  pravastatin  (PRAVACHOL ) 40 MG tablet Take 40 mg by mouth daily.    [provider]  PRESCRIPTION MEDICATION In Cholesterol  study. Has injection once a month.has been  in study for 3 years as of 12/07/23 and has 6 months to left in study. ( Will finish around end of August 2025 )    [provider]  rizatriptan (MAXALT) 5 MG tablet Take 5 mg by mouth as needed for migraine. (not more than 3 a week). May repeat in 2 hours if needed 12/06/23 12/05/24  [provider]  tiZANidine (ZANAFLEX) 4 MG tablet Take 1 tablet by mouth every 6 (six) hours as needed for muscle spasms. 10/05/23   [provider]  topiramate  (TOPAMAX ) 50 MG tablet Take 50-100 mg by mouth 2 (two) times daily. 50 mg am and 100 mg pm 10/19/22   [provider]  ursodiol  (ACTIGALL ) 300 MG capsule Take 1 capsule (300 mg total) by mouth 3 (three) times daily. 12/07/23   Ahmed, Deatrice FALCON, MD    Family History    Family History  Problem Relation Age of Onset   Ovarian cancer Mother    Diabetes Maternal Grandmother    Arthritis Maternal Grandmother    Diabetes Brother    Diabetes Paternal Grandmother    Diabetes Maternal Grandfather    Diabetes Paternal Grandfather    Heart disease Brother    Heart disease Other        Uncle   Breast cancer Maternal Aunt    She indicated that her mother is alive. She indicated that her father is alive. She indicated that her sister is alive. She indicated that only one of her two brothers is alive. She indicated that the status of her maternal grandmother is unknown. She indicated that the status of her maternal grandfather is unknown. She indicated that the status of her paternal grandmother is unknown. She indicated that the status of her paternal grandfather is unknown. She indicated that the status of her maternal aunt is unknown. She indicated that the status of her other is unknown.  Social History    Social History   Socioeconomic History   Marital status: Married    Spouse name: Alm   Number of children: 3   Years of education: [employer] education level: Not on file   Occupational History   Occupation: Economist    Comment: Chiropodist     Employer: [employer]  Tobacco Use   Smoking status: Former    Current packs/day: 0.00    Average packs/day: 2.0 packs/day for 30.0 years (60.0 ttl pk-yrs)    Types: Cigarettes    Start date: 10/19/1969    Quit date: 10/20/1999    Years since quitting: 24.5   Smokeless tobacco: Never  Vaping Use   Vaping status: Never Used  Substance and Sexual Activity   Alcohol use: Yes    Comment: occ   Drug use: No   Sexual activity: Yes  Other Topics Concern   Not on file  Social History Narrative   Patient is married with 3 children.   Patient is right handed.   Patient has college education.   Caffeine Use: 1.5 cup of coffee in a.m   Social Drivers of Health   Financial Resource Strain: Medium Risk (05/18/2021)   Received from [facility] visits prior to 12/16/2022.   Overall Financial Resource Strain (CARDIA)    Difficulty of Paying Living Expenses: Somewhat hard  Food Insecurity: No Food Insecurity (03/19/2024)   Hunger Vital Sign    Worried About Running Out of Food in the Last Year: Never true    Ran  Out of Food in the Last Year: Never true  Transportation Needs: No Transportation Needs (03/19/2024)   PRAPARE - Administrator, Civil Service (Medical): No    Lack of Transportation (Non-Medical): No  Physical Activity: Inactive (05/18/2021)   Received from [facility] visits prior to 12/16/2022.   Exercise Vital Sign    On average, how many days per week do you engage in moderate to strenuous exercise (like a brisk walk)?: 0 days    On average, how many minutes do you engage in exercise at this level?: 0 min  Stress: Stress Concern Present (05/18/2021)   Received from [facility] visits prior to 12/16/2022.   [employer] of Occupational Health - Occupational Stress Questionnaire    Feeling of Stress : Very much  Social  Connections: Unknown (03/24/2024)   Social Connection and Isolation Panel    Frequency of Communication with Friends and Family: More than three times a week    Frequency of Social Gatherings with Friends and Family: Three times a week    Attends Religious Services: Patient declined    Active Member of Clubs or Organizations: Patient declined    Attends Banker Meetings: Patient declined    Marital Status: Married  Catering manager Violence: Not At Risk (03/19/2024)   Humiliation, Afraid, Rape, and Kick questionnaire    Fear of Current or Ex-Partner: No    Emotionally Abused: No    Physically Abused: No    Sexually Abused: No     Review of Systems    General:  No chills, fever, night sweats or weight changes.  Cardiovascular:  No chest pain, dyspnea on exertion, edema, orthopnea, palpitations, paroxysmal nocturnal dyspnea. Dermatological: No rash, lesions/masses Respiratory: No cough, dyspnea Urologic: No hematuria, dysuria Abdominal:   No nausea, vomiting, diarrhea, bright red blood per rectum, melena, or hematemesis Neurologic:  No visual changes, wkns, changes in mental status. All other systems reviewed and are otherwise negative except as noted above.  Physical Exam    VS:  BP 116/88   Pulse 66   Ht 5' 5 (1.651 m)   Wt 160 lb (72.6 kg)   SpO2 98%   BMI 26.63 kg/m  , BMI Body mass index is 26.63 kg/m. GEN: Well nourished, well developed, in no acute distress. HEENT: normal. Neck: Supple, no JVD, carotid bruits, or masses. Cardiac: RRR, no murmurs, rubs, or gallops. No clubbing, cyanosis, edema.  Radials/DP/PT 2+ and equal bilaterally.  Respiratory:  Respirations regular and unlabored, clear to auscultation bilaterally. GI: Soft, nontender, nondistended, BS + x 4. MS: no deformity or atrophy. Skin: warm and dry, no rash. Neuro:  Strength and sensation are intact. Psych: Normal affect.  Accessory Clinical Findings    Recent Labs: 03/19/2024: ALT  46 03/20/2024: Hemoglobin 13.6; Platelets 169 03/24/2024: BUN 8; Creatinine, Ser 0.42; Magnesium  2.2; Potassium 3.7; Sodium 140   Recent Lipid Panel    Component Value Date/Time   CHOL 139 03/27/2013 0540   TRIG 135 03/27/2013 0540   HDL 23 (L) 03/27/2013 0540   CHOLHDL 6.0 03/27/2013 0540   VLDL 27 03/27/2013 0540   LDLCALC 89 03/27/2013 0540         ECG personally reviewed by me today- EKG Interpretation Date/Time:  Monday April 14 2024 09:14:52 EDT Ventricular Rate:  67 PR Interval:  150 QRS Duration:  72 QT Interval:  386 QTC Calculation: 407 R Axis:   20  Text Interpretation: Sinus rhythm  with Premature atrial complexes Nonspecific T wave abnormality When compared with ECG of 19-Mar-2024 06:31, PREVIOUS ECG IS PRESENT Confirmed by Emelia Hazy [phone]) on 04/14/2024 9:20:56 AM    Transesophageal echocardiogram 05/20/2013 Study Conclusions   - Left ventricle: Systolic function was normal. Wall motion    was normal; there were no regional wall motion    abnormalities.  - Left atrium: No evidence of thrombus in the atrial cavity    or appendage.  - Right atrium: No evidence of thrombus in the atrial cavity    or appendage.  - Atrial septum: There was a patent foramen ovale. Agitated    saline contrast study showed a large right-to-left atrial    level shunt, in the baseline state. Agitated saline    contrast study showed a large right-to-left atrial level    shunt, following an increase in RA pressure induced by    provocative maneuvers. The PFO tunnel length was 1.5cm.    About 25-30 bubbles noted with shunting crossing the PFO.  Transesophageal echocardiography.  2D and color Doppler.  Blood pressure:     118/37.  Patient status:  Outpatient.  Location:  Endoscopy.      [facility]HC 07/25/2022     No angiographic evidence of coronary artery disease   Given resting chest pain, risk factors (female patient, smoker) for CMD, the following indices were performed   Resting  Pd/Pa: 0.96 FFR 0.93 IMR 22 CFR 3.7   Consider non-cardiac cause of chest pain   Patient has had constant left sided facial numbness for last few days, present before and after the procedure. I do not think this is new finding.  No new focal neurodeficit. No suspicion of acute stroke.    Dominance: Right  Intervention     Assessment & Plan   1.  Palpitations-EKG today shows sinus rhythm with PACs 67 bpm. Heart healthy low-sodium diet Avoid triggers caffeine, chocolate, EtOH, dehydration etc.   Hyperlipidemia-reports compliance with lipid-lowering medications and aspirin .  Will have labs drawn in August High-fiber diet Continue pravastatin , aspirin  Follows with PCP   Dyspnea on exertion-denies increased work of breathing today.  Does note increased work of breathing with increased humidity.  Breathing is normal/stable when she stays inside.   Underwent LHC which showed normal coronary arteries.  Details above.  Her DOE was felt to be related to COPD. Increase physical activity as tolerated Follows with pulmonology   OSA-reports compliance with CPAP.  Waking up well rested. Continue CPAP use.   Disposition: Follow-up with Dr. Ladona in 12 months.   Hazy HERO. Kaide Gage NP-C     04/14/2024, 9:21 AM [facility] [address] Office [phone] Fax [phone]    I spent 13 minutes examining this patient, reviewing medications, and using patient centered shared decision making involving their cardiac care.   I spent  20 minutes reviewing past medical history,  medications, and prior cardiac tests.

## 2024-04-14 ENCOUNTER — Ambulatory Visit: Attending: General Practice | Admitting: General Practice

## 2024-04-14 ENCOUNTER — Encounter: Payer: Self-pay | Admitting: General Practice

## 2024-04-14 VITALS — BP 116/88 | HR 66 | Ht 65.0 in | Wt 160.0 lb

## 2024-04-14 DIAGNOSIS — G4733 Obstructive sleep apnea (adult) (pediatric): Secondary | ICD-10-CM | POA: Diagnosis not present

## 2024-04-14 DIAGNOSIS — R002 Palpitations: Secondary | ICD-10-CM | POA: Diagnosis not present

## 2024-04-14 DIAGNOSIS — I10 Essential (primary) hypertension: Secondary | ICD-10-CM

## 2024-04-14 DIAGNOSIS — R0609 Other forms of dyspnea: Secondary | ICD-10-CM | POA: Diagnosis not present

## 2024-04-14 DIAGNOSIS — E7800 Pure hypercholesterolemia, unspecified: Secondary | ICD-10-CM | POA: Diagnosis not present

## 2024-04-14 MED ORDER — AMLODIPINE BESYLATE 5 MG PO TABS: 5.0000 mg | ORAL_TABLET | Freq: Every day | ORAL | 3 refills | Status: AC

## 2024-04-14 NOTE — Patient Instructions (Signed)
 Medication Instructions:  Refilled Amlodipine  *If you need a refill on your cardiac medications before your next appointment, please call your pharmacy*  Lab Work: NONE If you have labs (blood work) drawn today and your tests are completely normal, you will receive your results only by: MyChart Message (if you have MyChart) OR A paper copy in the mail If you have any lab test that is abnormal or we need to change your treatment, we will call you to review the results.  Testing/Procedures: NONE  Follow-Up: At [facility], you and your health needs are our priority.  As part of our continuing mission to provide you with exceptional heart care, our providers are all part of one team.  This team includes your primary Cardiologist (physician) and Advanced Practice Providers or APPs (Physician Assistants and Nurse Practitioners) who all work together to provide you with the care you need, when you need it.  Your next appointment:   9-12 months  Provider:   Emelia, FNP-C  We recommend signing up for the patient portal called MyChart.  Sign up information is provided on this After Visit Summary.  MyChart is used to connect with patients for Virtual Visits (Telemedicine).  Patients are able to view lab/test results, encounter notes, upcoming appointments, etc.  Non-urgent messages can be sent to your provider as well.   To learn more about what you can do with MyChart, go to [email or web address].

## 2024-04-21 ENCOUNTER — Ambulatory Visit (HOSPITAL_COMMUNITY): Admit: 2024-04-21 | Admitting: Gastroenterology

## 2024-04-21 ENCOUNTER — Encounter (HOSPITAL_COMMUNITY): Payer: Self-pay

## 2024-04-21 SURGERY — COLONOSCOPY
Anesthesia: Choice

## 2024-04-23 ENCOUNTER — Encounter (INDEPENDENT_AMBULATORY_CARE_PROVIDER_SITE_OTHER): Payer: Self-pay | Admitting: Gastroenterology

## 2024-04-23 ENCOUNTER — Ambulatory Visit (INDEPENDENT_AMBULATORY_CARE_PROVIDER_SITE_OTHER): Admitting: Gastroenterology

## 2024-04-23 VITALS — BP 105/68 | HR 73 | Temp 98.0°F | Ht 65.0 in | Wt 162.2 lb

## 2024-04-23 DIAGNOSIS — R748 Abnormal levels of other serum enzymes: Secondary | ICD-10-CM | POA: Diagnosis not present

## 2024-04-23 DIAGNOSIS — R197 Diarrhea, unspecified: Secondary | ICD-10-CM | POA: Diagnosis not present

## 2024-04-23 DIAGNOSIS — Z8719 Personal history of other diseases of the digestive system: Secondary | ICD-10-CM

## 2024-04-23 DIAGNOSIS — K743 Primary biliary cirrhosis: Secondary | ICD-10-CM | POA: Diagnosis not present

## 2024-04-23 DIAGNOSIS — K909 Intestinal malabsorption, unspecified: Secondary | ICD-10-CM

## 2024-04-23 DIAGNOSIS — R760 Raised antibody titer: Secondary | ICD-10-CM

## 2024-04-23 MED ORDER — URSODIOL 300 MG PO CAPS: 300.0000 mg | ORAL_CAPSULE | Freq: Three times a day (TID) | ORAL | 5 refills | Status: AC

## 2024-04-23 NOTE — Progress Notes (Signed)
 Darlina Mccaughey Faizan Eshaan Titzer , M.D. Gastroenterology & Hepatology [facility] Gastroenterology [address] [place], [place] [address] Primary Care Physician: Debrah Josette ORN., PA-C [address] [place] [address]  Chief Complaint: Postprandial diarrhea  History of Present Illness: [name] is a 63 y.o. female with epilepsy, COPD, CVA,  rheumatoid arthritis on adalimumab, PBC on ursodiol  (negative biopsy but elevated AMA,ALP and with clinical constellation) who presents for evaluation of  Postprandial diarrhea  Patient was recently admitted for abdominal pain, diarrhea, colitis where she underwent upper endoscopy and colonoscopy.  Upper endoscopy demonstrated abnormal gastric mucosa biopsies consistent with chronic active gastritis and mild acute duodenitis.  Colon segmental biopsies were negative.CT at that time demonstrated persistent mild colitis  involving splenic flexure and descending colon.CTA without significant vascular compromise .  GI PCR was previously negative  Patient is here today for follow-up after being hospitalized.  Reports her main issue remains frequent bowel movements which is liquid in consistency 5-10 episodes daily.  Bowel movements are usually postprandial and patient reports she is unable to tolerate fruit vegetables or any other food except.  Protein such as lean hamburgers or grilled chicken.  Patient reports bloating and rumbling sounds from her abdomen after food ingestion Patient main issue remains solid food going straight through her body after eating  Although patient reports if she does not take Linzess  she will be constipated for 2 to 3 days upon which she takes Linzess .  Patient was initially seen in our GI clinic for elevated liver enzymes Last EGD:03/2025 - Normal esophagus. Dilated. Abnormal gastric mucosa of uncertain significance - status post biopsy - Small hiatal hernia.  Last Colonoscopy:03/2025  - Edematous  left colon as described. Status post segmental biopsy. .  A. GASTRIC BIOPSY:  Chronic active gastritis with reactive epithelial changes  Helicobacter stain negative (IHC, adequate control)  Negative for intestinal metaplasia, dysplasia and carcinoma   B. DUODENAL BIOPSY:  Mild acute duodenitis   C. ASCENDING COLON, BIOPSY:  Benign colonic mucosa with no diagnostic abnormality   D. TRANSVERSE COLON, BIOPSY:  Benign colonic mucosa with no diagnostic abnormality   E. DESCENDING COLON, BIOPSY:  Benign colonic mucosa with no diagnostic abnormality   F. SIGMOID COLON, BIOPSY:  Benign colonic mucosa with no diagnostic abnormality   G. RECTUM, BIOPSY:  Benign colonic mucosa with no diagnostic abnormality   Past Medical History: Past Medical History:  Diagnosis Date   Allergic rhinitis    uses Flonase  daily as needed and takes CLaritin  daily   Anxiety    takes Xanax  daily as needed   Asthma    Albuterol  inhaler prn;SYmbicort  daily   Cerebral vascular malformation    sees dr mavis for monitoring as needed, sees dr lewitt for headaches every 4 months   COPD (chronic obstructive pulmonary disease) (HCC)    Depression    takes Citalopram  daily   Diverticulitis at age 42   Eczema    Fibromyalgia    GERD (gastroesophageal reflux disease)    takes Protonix  daily   Hard of hearing    History of kidney stones    History of migraine    last one 10+yrs ago   History of staph infection 51yrs ago   Hyperlipidemia    takes Pravastatin  daily   IBS (irritable bowel syndrome)    mixed   Insomnia    Joint pain    Lactose intolerance    Nausea    takes Zofran  daily as needed  PFO (patent foramen ovale)    Plaque psoriasis    PONV (postoperative nausea and vomiting)    Postoperative anemia due to acute blood loss 02/08/2016   Primary localized osteoarthritis of left knee    Primary localized osteoarthritis of right knee 11/03/2015   Rheumatoid arthritis(714.0) 10/16/2008   oa  and ra;Rhemicade IV every 6wks and Metotrexate weekly   Seizures (HCC) 6months ago 03/21/14   takes Depakote  daily   Shortness of breath    with exertion   Sleep apnea    study done >83yrs ago;uses CPAP nightly   Spinal headache    patient states that she thinks she had a spinal headache a long time ago   Stress incontinence    Stroke (HCC) 03/26/2013   left sided weakness   Thyroid  cyst     Past Surgical History: Past Surgical History:  Procedure Laterality Date   APPENDECTOMY  1981   BIOPSY  12/16/2019   Procedure: BIOPSY;  Surgeon: Kristie Lamprey, MD;  Location: [facility];  Service: Endoscopy;;   BIOPSY  07/19/2023   Procedure: BIOPSY;  Surgeon: Cinderella Deatrice FALCON, MD;  [facility]: [facility];  Service: Endoscopy;;   CARDIAC CATHETERIZATION  2004   CHOLECYSTECTOMY  1981   COLONOSCOPY     COLONOSCOPY N/A 03/21/2024   Procedure: COLONOSCOPY;  Surgeon: Shaaron Lamar HERO, MD;  [facility]: [facility];  Service: Endoscopy;  Laterality: N/A;   COLONOSCOPY WITH PROPOFOL  N/A 12/16/2019   Procedure: COLONOSCOPY WITH PROPOFOL ;  Surgeon: Kristie Lamprey, MD;  Location: [facility];  Service: Endoscopy;  Laterality: N/A;   CORONARY PRESSURE/FFR STUDY N/A 07/25/2022   Procedure: INTRAVASCULAR PRESSURE WIRE/FFR STUDY;  Surgeon: Elmira Newman PARAS, MD;  [facility];  Service: Cardiovascular;  Laterality: N/A;   ESOPHAGOGASTRODUODENOSCOPY     ESOPHAGOGASTRODUODENOSCOPY N/A 03/21/2024   Procedure: EGD (ESOPHAGOGASTRODUODENOSCOPY);  Surgeon: Shaaron Lamar HERO, MD;  [facility];  Service: Endoscopy;  Laterality: N/A;   ESOPHAGOGASTRODUODENOSCOPY (EGD) WITH PROPOFOL  N/A 07/19/2023   Procedure: ESOPHAGOGASTRODUODENOSCOPY (EGD) WITH PROPOFOL ;  Surgeon: Cinderella Deatrice FALCON, MD;  [facility];  Service: Endoscopy;  Laterality: N/A;  12:45;asa 1-2, bumped to 1:00 for lunch to fit - messaged Tanya   FOOT SURGERY  1999   right ankle   HERNIA REPAIR  2006   x2   INCISIONAL  HERNIA REPAIR  05/20/2012   Procedure: HERNIA REPAIR INCISIONAL;  Surgeon: Debby LABOR. Cornett, MD;  Location: [facility];  Service: General;  Laterality: N/A;   INCONTINENCE SURGERY  2010   sling done    KIDNEY STONE SURGERY  2001   KNEE ARTHROSCOPY  1992   left   left foot plating and scarping for arthritis  2011   LEFT HEART CATH AND CORONARY ANGIOGRAPHY N/A 07/25/2022   Procedure: LEFT HEART CATH AND CORONARY ANGIOGRAPHY;  Surgeon: Elmira Newman PARAS, MD;  [facility];  Service: Cardiovascular;  Laterality: N/A;   POLYPECTOMY  07/19/2023   Procedure: POLYPECTOMY;  Surgeon: Cinderella Deatrice FALCON, MD;  [facility];  Service: Endoscopy;;   right ear tube insertion  2011   TEE WITHOUT CARDIOVERSION N/A 05/15/2013   Procedure: TRANSESOPHAGEAL ECHOCARDIOGRAM (TEE);  Surgeon: Erick JONELLE Bergamo, MD;  Location: [facility] ENDOSCOPY;  Service: Cardiovascular;  Laterality: N/A;   thryoid biopsy     TONSILLECTOMY AND ADENOIDECTOMY  04/01/2014   TONSILLECTOMY AND ADENOIDECTOMY Bilateral 04/01/2014   Procedure: BILATERAL TONSILLECTOMY AND ADENOIDECTOMY;  Surgeon: Ana LELON Moccasin, MD;  Location: [facility]  OR;  Service: ENT;  Laterality: Bilateral;   TOTAL ABDOMINAL HYSTERECTOMY  2001   TOTAL KNEE ARTHROPLASTY Right 11/03/2015   TOTAL KNEE ARTHROPLASTY Right 11/03/2015   Procedure: RIGHT TOTAL KNEE ARTHROPLASTY/RIGHT;  Surgeon: Lamar Millman, MD;  Location: [facility] OR;  Service: Orthopedics;  Laterality: Right;   TOTAL KNEE ARTHROPLASTY Left 02/07/2016   Procedure: TOTAL KNEE ARTHROPLASTY;  Surgeon: Lamar Millman, MD;  Location: [facility] OR;  Service: Orthopedics;  Laterality: Left;   TUBAL LIGATION  1990    Family History: Family History  Problem Relation Age of Onset   Ovarian cancer Mother    Diabetes Maternal Grandmother    Arthritis Maternal Grandmother    Diabetes Brother    Diabetes Paternal Grandmother    Diabetes Maternal Grandfather    Diabetes Paternal Grandfather    Heart disease Brother     Heart disease Other        Uncle   Breast cancer Maternal Aunt     Social History: Social History   Tobacco Use  Smoking Status Former   Current packs/day: 0.00   Average packs/day: 2.0 packs/day for 30.0 years (60.0 ttl pk-yrs)   Types: Cigarettes   Start date: 10/19/1969   Quit date: 10/20/1999   Years since quitting: 24.5  Smokeless Tobacco Never   Social History   Substance and Sexual Activity  Alcohol Use Yes   Comment: occ   Social History   Substance and Sexual Activity  Drug Use No    Allergies: Allergies  Allergen Reactions   Aquacel [Carboxymethylcellulose] Other (See Comments)    Blisters    Dextromethorphan Anaphylaxis   Nickel Rash    Turn read   Sulfonamide Derivatives Anaphylaxis, Hives and Swelling    Swelling of tongue   Aspirin  Nausea And Vomiting    Severe GI upset.  Pt states she can tolerate 81 mg asa only.    Ciprofloxacin Itching and Swelling    Angioedema, urticaria   Cosyntropin Hives and Swelling   Amoxicillin  Nausea And Vomiting    GI upset   Diclofenac Sodium Dermatitis    Gel causes legs to break out   Latex Rash    Blisters   Moxifloxacin Other (See Comments)    GI upset   Nsaids Other (See Comments)    Gi upset   Tape Other (See Comments) and Dermatitis    Blisters skin    Medications: Current Outpatient Medications  Medication Sig Dispense Refill   albuterol  (VENTOLIN  HFA) 108 (90 Base) MCG/ACT inhaler Inhale 1-2 puffs into the lungs every 6 (six) hours as needed for wheezing or shortness of breath.     ALPRAZolam  (XANAX ) 0.5 MG tablet Take 0.5 mg by mouth at bedtime.     amLODipine  (NORVASC ) 5 MG tablet Take 1 tablet (5 mg total) by mouth daily. 90 tablet 3   aspirin  EC 81 MG tablet Take 81 mg by mouth at bedtime.     azelastine (OPTIVAR) 0.05 % ophthalmic solution Place 1 drop into both eyes 2 (two) times daily.     dicyclomine  (BENTYL ) 10 MG capsule Take 1 capsule (10 mg total) by mouth 4 (four) times daily -  before  meals and at bedtime. 120 capsule 0   hyoscyamine  (LEVSIN  SL) 0.125 MG SL tablet Place 1 tablet (0.125 mg total) under the tongue every 6 (six) hours as needed. 60 tablet 1   ipratropium (ATROVENT ) 0.03 % nasal spray Place 2 sprays into the nose in the morning and at bedtime.  leflunomide (ARAVA) 10 MG tablet Take 1 tablet by mouth daily.     loratadine  (CLARITIN ) 10 MG tablet Take 10 mg by mouth.     nitrofurantoin, macrocrystal-monohydrate, (MACROBID) 100 MG capsule Take 100 mg by mouth 2 (two) times daily.     nitroGLYCERIN  (NITROSTAT ) 0.4 MG SL tablet Place 1 tablet (0.4 mg total) under the tongue every 5 (five) minutes as needed for chest pain. 25 tablet 3   ondansetron  (ZOFRAN ) 4 MG tablet Take 1 tablet (4 mg total) by mouth daily as needed for nausea or vomiting. 30 tablet 1   OVER THE COUNTER MEDICATION Bio True eye drops daily.     pantoprazole  (PROTONIX ) 40 MG tablet TAKE 1 TABLET BY MOUTH DAILY 100 tablet 2   phentermine  15 MG capsule Take 15 mg by mouth daily.     pravastatin  (PRAVACHOL ) 40 MG tablet Take 40 mg by mouth daily.     PRESCRIPTION MEDICATION In Cholesterol study. Has injection once a month.has been in study for 3 years as of 12/07/23 and has 6 months to left in study. ( Will finish around end of August 2025 )     rizatriptan (MAXALT) 5 MG tablet Take 5 mg by mouth as needed for migraine. (not more than 3 a week). May repeat in 2 hours if needed     tiZANidine (ZANAFLEX) 4 MG tablet Take 1 tablet by mouth every 6 (six) hours as needed for muscle spasms.     topiramate  (TOPAMAX ) 50 MG tablet Take 50-100 mg by mouth 2 (two) times daily. 50 mg am and 100 mg pm     ursodiol  (ACTIGALL ) 300 MG capsule Take 1 capsule (300 mg total) by mouth 3 (three) times daily. 90 capsule 5   No current facility-administered medications for this visit.    Review of Systems: GENERAL: negative for malaise, night sweats HEENT: No changes in hearing or vision, no nose bleeds or other nasal  problems. NECK: Negative for lumps, goiter, pain and significant neck swelling RESPIRATORY: Negative for cough, wheezing CARDIOVASCULAR: Negative for chest pain, leg swelling, palpitations, orthopnea GI: SEE HPI MUSCULOSKELETAL: Negative for joint pain or swelling, back pain, and muscle pain. SKIN: Negative for lesions, rash HEMATOLOGY Negative for prolonged bleeding, bruising easily, and swollen nodes. ENDOCRINE: Negative for cold or heat intolerance, polyuria, polydipsia and goiter. NEURO: negative for tremor, gait imbalance, syncope and seizures. The remainder of the review of systems is noncontributory.   Physical Exam: BP 105/68   Pulse 73   Temp 98 F (36.7 C)   Ht 5' 5 (1.651 m)   Wt 162 lb 3.2 oz (73.6 kg)   BMI 26.99 kg/m  GENERAL: The patient is AO x3, in no acute distress. HEENT: Head is normocephalic and atraumatic. EOMI are intact. Mouth is well hydrated and without lesions. NECK: Supple. No masses LUNGS: Clear to auscultation. No presence of rhonchi/wheezing/rales. Adequate chest expansion HEART: RRR, normal s1 and s2. ABDOMEN: Soft, nontender, no guarding, no peritoneal signs, and nondistended. BS +. No masses.  Imaging/Labs: as above     Latest Ref Rng & Units 03/20/2024    6:37 AM 03/19/2024   12:29 AM 02/29/2024    4:35 AM  CBC  WBC 4.0 - 10.5 K/uL 4.6  15.8  6.5   Hemoglobin 12.0 - 15.0 g/dL 86.3  82.5  85.4   Hematocrit 36.0 - 46.0 % 39.3  49.3  42.3   Platelets 150 - 400 K/uL 169  320  197  Lab Results  Component Value Date   IRON 115 10/03/2023   TIBC 312 10/03/2023   FERRITIN 252 10/03/2023    I personally reviewed and interpreted the available labs, imaging and endoscopic files.  IMPRESSION: 1. Normal contour and caliber of the abdominal aorta. No evidence of aneurysm, dissection, or other acute aortic pathology. Moderate mixed calcific atherosclerosis. 2. Minimal noncalcific narrowing of the origin of the superior mesenteric artery with  less than 50% stenosis. The vessel remains patent and opacified through its proximal order branches. 3. The descending colon, sigmoid colon, and rectum are decompressed although there is mild mucosal hyperenhancement. Colon is fluid-filled. Findings are consistent with nonspecific colitis. 4. Sigmoid diverticulosis without specific evidence of acute diverticulitis. 5. Hepatic steatosis. 6. Nonobstructive left nephrolithiasis 7. Status post hysterectomy.   Negative celiac panel 2022   Negative ASMA, HIV, ANA Negative hep C, negative hep B surface antigen, hep B nonimmune nonexposed Hep A immune Ferritin 252 INR 0.9 ALT 35 alk phos 96 AMA significantly 65.2    Liver biopsy   - Minimally active steatohepatitis (grade 0-1 of 3), see comment - No pathologic fibrosis    COMMENT:   The biopsy shows liver parenchyma with a preserved architecture. Hepatic lobules show moderate steatosis (about 35-40%) with minimal ballooning degeneration. Only isolated foci of lobular necroinflammation are seen.  Scattered glycogenated nuclei are present, suggestive of insulin resistance.  Portal tracts are mostly unremarkable with preserved biliary and vascular structures and minimal inflammation. Iron stain shows minimal granular iron deposition within hepatocytes (0-1+) of uncertain significance. Trichrome and reticulin stains do not show any significant fibrosis. PASD stain shows no globular inclusions in hepatocytes. The findings are consistent with minimally active steatohepatitis, alcoholic and/or non-alcoholic, without any pathologic fibrosis.    ALP elevated for the first time : 122 ALT: 49 remains elevated  GGT : 28  IgM: 128 ( normal) Anti GP 210: negative   Impression and Plan:  [name] is a 63 y.o. female with epilepsy, COPD, CVA,  rheumatoid arthritis on adalimumab, PBC on ursodiol  (negative biopsy but elevated AMA,ALP and with clinical constellation) who presents for  evaluation of  Postprandial diarrhea  # Postprandial diarrhea  Patient had recurrent CT finding of colitis of unknown etiology, biopsies negative GI PCR negative, CTA without any vascular compromise  Her main complaint today remains unable to tolerate most food except protein.  Patient is unable to tolerate any carbohydrate including fruits and vegetables  Diarrhea is mostly postprandial hence this could be malabsorption.  Negative celiac panel in 2022   This could be SIBO, sucrolase isoenzyme deficiency, exocrine pancreatic enzyme deficiency need to rule out hypergastrinemia.  Given severity of patient's symptoms will obtain further workup.  Previously x-ray had significant stool burden patient was treated for constipation overflow diarrhea with Linzess   Recs:  Will obtain sucrase breath testing, testing kit  SIBO testing referral  CREON samples given   Can stop linzess  for now   Lactose-free diet  Morning gastrin levels  We will recheck celiac panel once patient is eating gluten diet  # Elevated liver enzymes #Elevated AMA concerning clinically for PBC Hepatocellular pattern liver injury   See workup above    Initial impression was DILI (drug-induced liver injury) or MASLD-metabolic dysfunction associated steatotic liver disease but recent workup with significant AMA positivity concerning for 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) Most likely cause    Patient's medication reviewed previously on leflunomide which was stopped  Liver biopsy only  suggest steatohepatitis   Recs:   Given generalized pruritus , significant elevated AMA and slight elevation in ALP , although biopsies are negative for PBC-AIH ( this can be a patchy or very early disease) will treat clinically as PBC   Urosdiol will treat patient generalized pruritus and delay progression to end-stage renal disease and transplant if she truly has underlying PBC   Liver tests  every 3?6 months TSH annually Bone mineral densitometry every 2 years Vitamins A, D, E and prothrombin time annually if bilirubin >2.0   -UDCA in a dose of 13 to 15 mg/kg/day orally      #MASLD   Risk factors for MASLD with  BMI 31 and prediabetes hemoglobin A1c 5.8 and mixed hyperlipidemia   Recommendation :     - walking at a brisk pace/biking at moderate intesity 2.5-5 hours per week     - use pedometer/step counter to track activity     - goal to lose 5-10% of initial body weight     - avoid suagry drinks and juices, use zero calorie beverages     - increase water  intake     - eat a low carb diet with plenty of veggies and fruit     - Get sufficient sleep 7-8 hrs nightly     - maitain active lifestyle     - avoid alcohol     - recommend 2-3 cups Coffee daily     - Counsel on lowering cholesterol by having a diet rich in vegetables,          protein (avoid red meats) and good fats(fish, salmon).   Recent colonoscopy does not appear to be adequate for screening purposes, we will plan to repeat in 3 years  All questions were answered.      Tashira Torre Faizan Dionel Archey, MD Gastroenterology and Hepatology [facility] Gastroenterology   This chart has been completed using [facility] Dictation software, and while attempts have been made to ensure accuracy , certain words and phrases may not be transcribed as intended

## 2024-04-23 NOTE — Patient Instructions (Addendum)
 It was very nice to meet you today, as dicussed with will plan for the following :  1) Sucrase breath testing  2)CREON samples only to be taken after stool testing is done  3) do blood test early in the morning  Exocrine Pancreatic Insufficiency (EPI) is a condition in which your body doesn't provide enough pancreatic enzymes to properly digest your food (which can sometimes lead to some unpleasant digestive symptoms).  For many people, EPI is also a chronic lifelong condition.  That's why its so important to know what to expect with your EPI treatment, because with the right plan in place, EPI is manageable.  HOW DO I TAKE PANCREATIC ENZYMES?  Pancreatic Enzyme Replacement therapy (Creon) is only available through prescription and cannot be substituted with over the counter alternatives.  Your doctor will personalize your dose based on your weight, diet, and symptoms. The number of capsules you take per meal will depend on your prescribed dose.  Creon must be taken DURING every meal and snack.   Whether its a full meal or a snack, take Creon every time you eat. Remember to follow your treatment plan closely and take Creon exactly as prescribed - consistency is key!!

## 2024-05-02 DIAGNOSIS — M180 Bilateral primary osteoarthritis of first carpometacarpal joints: Secondary | ICD-10-CM | POA: Insufficient documentation

## 2024-05-05 ENCOUNTER — Encounter (INDEPENDENT_AMBULATORY_CARE_PROVIDER_SITE_OTHER): Payer: Self-pay | Admitting: Gastroenterology

## 2024-05-05 ENCOUNTER — Telehealth (INDEPENDENT_AMBULATORY_CARE_PROVIDER_SITE_OTHER): Payer: Self-pay | Admitting: Gastroenterology

## 2024-05-05 DIAGNOSIS — K909 Intestinal malabsorption, unspecified: Secondary | ICD-10-CM

## 2024-05-05 NOTE — Telephone Encounter (Signed)
 Pt left voicemail stating that she went to [facility] and had lab work but they needed a separate order for the stool sample.  Reordered stool sample and faxed to [facility]. Contacted pt but had to leave message that order was faxed to [facility]

## 2024-05-07 ENCOUNTER — Encounter (INDEPENDENT_AMBULATORY_CARE_PROVIDER_SITE_OTHER): Payer: Self-pay | Admitting: Gastroenterology

## 2024-05-07 LAB — PANCREATIC ELASTASE, FECAL: Pancreatic Elastase, Fecal: 491 ug Elast./g (ref >200)

## 2024-05-08 ENCOUNTER — Encounter (INDEPENDENT_AMBULATORY_CARE_PROVIDER_SITE_OTHER): Payer: Self-pay

## 2024-05-08 LAB — PANCREATIC ELASTASE, FECAL

## 2024-05-08 LAB — GASTRIN: Gastrin: 44 pg/mL (ref 0–115)

## 2024-05-09 ENCOUNTER — Ambulatory Visit: Payer: Medicare Other | Admitting: Internal Medicine

## 2024-05-12 ENCOUNTER — Ambulatory Visit (INDEPENDENT_AMBULATORY_CARE_PROVIDER_SITE_OTHER): Payer: Self-pay | Admitting: Gastroenterology

## 2024-05-12 ENCOUNTER — Telehealth (INDEPENDENT_AMBULATORY_CARE_PROVIDER_SITE_OTHER): Payer: Self-pay | Admitting: Gastroenterology

## 2024-05-12 DIAGNOSIS — E7431 Sucrase-isomaltase deficiency: Secondary | ICD-10-CM

## 2024-05-12 MED ORDER — SUCRAID 8500 UNIT/ML PO SOLN: 2.0000 mL | Freq: Three times a day (TID) | ORAL | 2 refills | Status: DC

## 2024-05-12 NOTE — Telephone Encounter (Signed)
 Sucraid  will need to go to [employer] [facility].

## 2024-05-12 NOTE — Telephone Encounter (Signed)
   Patient had a positive test suggesting sucrase deficiency , which can explain patient not able to tolerate fruits/vegetables   Will prescribe sucrase supplementation

## 2024-05-12 NOTE — Addendum Note (Signed)
 Addended by: CINDERELLA DEATRICE SMILES on: 05/12/2024 01:51 PM   Modules accepted: Orders

## 2024-05-12 NOTE — Telephone Encounter (Signed)
 [name]

## 2024-05-12 NOTE — Telephone Encounter (Signed)
 Pt replied via my chart. Please see my chart message from 05/09/24

## 2024-05-12 NOTE — Progress Notes (Signed)
 Normal gastrin level Normal fecal elastase level  Prescribed sucrosidase (SUCRAID ) given positive test suggesting sucrase deficiency

## 2024-05-12 NOTE — Telephone Encounter (Signed)
 Thank you , I have prescribed it

## 2024-05-15 ENCOUNTER — Encounter (INDEPENDENT_AMBULATORY_CARE_PROVIDER_SITE_OTHER): Payer: Self-pay | Admitting: Gastroenterology

## 2024-05-19 ENCOUNTER — Telehealth (INDEPENDENT_AMBULATORY_CARE_PROVIDER_SITE_OTHER): Payer: Self-pay | Admitting: Gastroenterology

## 2024-05-19 ENCOUNTER — Encounter (INDEPENDENT_AMBULATORY_CARE_PROVIDER_SITE_OTHER): Payer: Self-pay | Admitting: Gastroenterology

## 2024-05-19 NOTE — Telephone Encounter (Signed)
 Pt contacted. Pt wanted to know what she needed to do know. Pt states she is still having issues. Pt is coming by today to pick up samples that were left from last week. Pt also wanted to know about price and place that Sucraid  was at. Advised pt it had to be sent to a specialty pharmacy and she would need to call the pharmacy to get information on price. Printed out pharmacy information and will place in bag with samples.

## 2024-05-19 NOTE — Telephone Encounter (Signed)
 Negative SIBO testing done at [facility]

## 2024-05-22 ENCOUNTER — Telehealth (INDEPENDENT_AMBULATORY_CARE_PROVIDER_SITE_OTHER): Payer: Self-pay | Admitting: Gastroenterology

## 2024-05-22 ENCOUNTER — Encounter (INDEPENDENT_AMBULATORY_CARE_PROVIDER_SITE_OTHER): Payer: Self-pay

## 2024-05-22 NOTE — Telephone Encounter (Signed)
 Spoke with [employer] and they states that pt will need to meet deductible. If pt meets deductible it would cost $0. Pt can call [employer] and speak with the Reimbursment Team to see if she qualifies for any assistance Contacted pt. Pt states she spoke with them 3 time last week and they told her she did not qualify because she has a pension at her job.  I have a list of Community Assistance programs and I reached out to [facility]) and asked them if they had an assistance program for patient of [place]. Receptionist took down my name and number and she will pass it along to the representative that deals with the Medication Assistance Program and she will give me a call back to see what can be done, if anything. I will also try to keep looking for other avenues to help patient.

## 2024-05-22 NOTE — Telephone Encounter (Signed)
 Also, on MyChart there is the option for [employer]. There is a program called RxLess that has coupons for Sucrose. I have sent the pt a my chart message letting her know about it and I let her know if she needed to come by the office for me to show her, I would be more than happy to.

## 2024-05-22 NOTE — Telephone Encounter (Signed)
 Per Secure Chat from Dr.Ahmed: [name] I can't find crystal here , can you add her as well. This is the patient who needs SUCRAID  , positive testing and very symptomatic   Testing under Media.

## 2024-06-04 ENCOUNTER — Ambulatory Visit (INDEPENDENT_AMBULATORY_CARE_PROVIDER_SITE_OTHER): Admitting: Gastroenterology

## 2024-06-04 ENCOUNTER — Encounter (INDEPENDENT_AMBULATORY_CARE_PROVIDER_SITE_OTHER): Payer: Self-pay | Admitting: Gastroenterology

## 2024-06-04 VITALS — BP 92/61 | HR 63 | Temp 97.9°F | Ht 65.0 in | Wt 158.4 lb

## 2024-06-04 DIAGNOSIS — K760 Fatty (change of) liver, not elsewhere classified: Secondary | ICD-10-CM

## 2024-06-04 DIAGNOSIS — K581 Irritable bowel syndrome with constipation: Secondary | ICD-10-CM

## 2024-06-04 DIAGNOSIS — K743 Primary biliary cirrhosis: Secondary | ICD-10-CM

## 2024-06-04 DIAGNOSIS — E7431 Sucrase-isomaltase deficiency: Secondary | ICD-10-CM | POA: Diagnosis not present

## 2024-06-04 DIAGNOSIS — Z8719 Personal history of other diseases of the digestive system: Secondary | ICD-10-CM | POA: Diagnosis not present

## 2024-06-04 DIAGNOSIS — R768 Other specified abnormal immunological findings in serum: Secondary | ICD-10-CM | POA: Diagnosis not present

## 2024-06-04 MED ORDER — LUBIPROSTONE 24 MCG PO CAPS: 24.0000 ug | ORAL_CAPSULE | Freq: Two times a day (BID) | ORAL | 5 refills | Status: AC

## 2024-06-04 NOTE — Progress Notes (Signed)
 [name] [name] , M.D. Gastroenterology & Hepatology [facility] Gastroenterology [address] [place], [place] [address] Primary Care Physician: Debrah Josette ORN., PA-C [address] [place] [address]  Chief Complaint: sucrolase isoenzyme deficiency, PBC   History of Present Illness: [name] is a 63 y.o. female with epilepsy, COPD, CVA,  rheumatoid arthritis on adalimumab, PBC on ursodiol  (negative biopsy but elevated AMA,ALP and with clinical constellation) who presents for evaluation of  PBC. Postprandial diarrhea, recent new diagnosis of SID ( sucrolase isoenzyme deficiency)  Patient had recurrent hospitalization for abdominal pain, diarrhea, colitis where she underwent upper endoscopy and colonoscopy.  Upper endoscopy demonstrated abnormal gastric mucosa biopsies consistent with chronic active gastritis and mild acute duodenitis.  Colon segmental biopsies were negative.CT at that time demonstrated persistent mild colitis  involving splenic flexure and descending colon.CTA without significant vascular compromise .  GI PCR was previously negative  Patient had extensive workup for postprandial abdominal pain and diarrhea where SIBO testing was negative, normal fecal elastase .  Sucrase activity was found to be low suggesting the SID  ( sucrolase isoenzyme deficiency).  Patient is known to have lactose intolerance for past many years    Reports her main issue remains frequent bowel movements which is liquid in consistency 5-10 episodes daily.  Bowel movements are usually postprandial and patient reports she is unable to tolerate fruit vegetables or any other food except.  Protein such as lean hamburgers or grilled chicken.  Patient reports bloating and rumbling sounds from her abdomen after food ingestion Patient main issue remains solid food going straight through her body after eating.  Patient symptoms are very much controlled if she only eats  meat(protein ) and no carbs   Although patient reports if she does not take Linzess  she will be constipated for 2 to 3 days upon which she takes Linzess .  Patient was initially seen in our GI clinic for elevated liver enzymes and was seen Last EGD:03/2025 - Normal esophagus. Dilated. Abnormal gastric mucosa of uncertain significance - status post biopsy - Small hiatal hernia.  Last Colonoscopy:03/2025  - Edematous left colon as described. Status post segmental biopsy. .  A. GASTRIC BIOPSY:  Chronic active gastritis with reactive epithelial changes  Helicobacter stain negative (IHC, adequate control)  Negative for intestinal metaplasia, dysplasia and carcinoma   B. DUODENAL BIOPSY:  Mild acute duodenitis   C. ASCENDING COLON, BIOPSY:  Benign colonic mucosa with no diagnostic abnormality   D. TRANSVERSE COLON, BIOPSY:  Benign colonic mucosa with no diagnostic abnormality   E. DESCENDING COLON, BIOPSY:  Benign colonic mucosa with no diagnostic abnormality   F. SIGMOID COLON, BIOPSY:  Benign colonic mucosa with no diagnostic abnormality   G. RECTUM, BIOPSY:  Benign colonic mucosa with no diagnostic abnormality   Past Medical History: Past Medical History:  Diagnosis Date   Allergic rhinitis    uses Flonase  daily as needed and takes CLaritin  daily   Anxiety    takes Xanax  daily as needed   Asthma    Albuterol  inhaler prn;SYmbicort  daily   Cerebral vascular malformation    sees dr mavis for monitoring as needed, sees dr lewitt for headaches every 4 months   COPD (chronic obstructive pulmonary disease) (HCC)    Depression    takes Citalopram  daily   Diverticulitis at age 1   Eczema    Fibromyalgia    GERD (gastroesophageal reflux disease)    takes Protonix  daily   Hard of  hearing    History of kidney stones    History of migraine    last one 10+yrs ago   History of staph infection 48yrs ago   Hyperlipidemia    takes Pravastatin  daily   IBS (irritable bowel  syndrome)    mixed   Insomnia    Joint pain    Lactose intolerance    Nausea    takes Zofran  daily as needed   PFO (patent foramen ovale)    Plaque psoriasis    PONV (postoperative nausea and vomiting)    Postoperative anemia due to acute blood loss 02/08/2016   Primary localized osteoarthritis of left knee    Primary localized osteoarthritis of right knee 11/03/2015   Rheumatoid arthritis(714.0) 10/16/2008   oa and ra;Rhemicade IV every 6wks and Metotrexate weekly   Seizures (HCC) 6months ago 03/21/14   takes Depakote  daily   Shortness of breath    with exertion   Sleep apnea    study done >38yrs ago;uses CPAP nightly   Spinal headache    patient states that she thinks she had a spinal headache a long time ago   Stress incontinence    Stroke (HCC) 03/26/2013   left sided weakness   Thyroid  cyst     Past Surgical History: Past Surgical History:  Procedure Laterality Date   APPENDECTOMY  1981   BIOPSY  12/16/2019   Procedure: BIOPSY;  Surgeon: Kristie Lamprey, MD;  Location: [facility];  Service: Endoscopy;;   BIOPSY  07/19/2023   Procedure: BIOPSY;  Surgeon: Cinderella Deatrice FALCON, MD;  [facility]: [facility];  Service: Endoscopy;;   CARDIAC CATHETERIZATION  2004   CHOLECYSTECTOMY  1981   COLONOSCOPY     COLONOSCOPY N/A 03/21/2024   Procedure: COLONOSCOPY;  Surgeon: Shaaron Lamar HERO, MD;  [facility]: [facility];  Service: Endoscopy;  Laterality: N/A;   COLONOSCOPY WITH PROPOFOL  N/A 12/16/2019   Procedure: COLONOSCOPY WITH PROPOFOL ;  Surgeon: Kristie Lamprey, MD;  Location: [facility];  Service: Endoscopy;  Laterality: N/A;   CORONARY PRESSURE/FFR STUDY N/A 07/25/2022   Procedure: INTRAVASCULAR PRESSURE WIRE/FFR STUDY;  Surgeon: Elmira Newman PARAS, MD;  [facility]: [facility];  Service: Cardiovascular;  Laterality: N/A;   ESOPHAGOGASTRODUODENOSCOPY     ESOPHAGOGASTRODUODENOSCOPY N/A 03/21/2024   Procedure: EGD (ESOPHAGOGASTRODUODENOSCOPY);  Surgeon: Shaaron Lamar HERO, MD;   [facility];  Service: Endoscopy;  Laterality: N/A;   ESOPHAGOGASTRODUODENOSCOPY (EGD) WITH PROPOFOL  N/A 07/19/2023   Procedure: ESOPHAGOGASTRODUODENOSCOPY (EGD) WITH PROPOFOL ;  Surgeon: Cinderella Deatrice FALCON, MD;  [facility]: [facility];  Service: Endoscopy;  Laterality: N/A;  12:45;asa 1-2, bumped to 1:00 for lunch to fit - messaged Tanya   FOOT SURGERY  1999   right ankle   HERNIA REPAIR  2006   x2   INCISIONAL HERNIA REPAIR  05/20/2012   Procedure: HERNIA REPAIR INCISIONAL;  Surgeon: Debby LABOR. Cornett, MD;  Location: [facility];  Service: General;  Laterality: N/A;   INCONTINENCE SURGERY  2010   sling done    KIDNEY STONE SURGERY  2001   KNEE ARTHROSCOPY  1992   left   left foot plating and scarping for arthritis  2011   LEFT HEART CATH AND CORONARY ANGIOGRAPHY N/A 07/25/2022   Procedure: LEFT HEART CATH AND CORONARY ANGIOGRAPHY;  Surgeon: Elmira Newman PARAS, MD;  [facility];  Service: Cardiovascular;  Laterality: N/A;   POLYPECTOMY  07/19/2023   Procedure: POLYPECTOMY;  Surgeon: Cinderella Deatrice FALCON, MD;  [facility];  Service: Endoscopy;;   right ear tube insertion  2011   TEE WITHOUT CARDIOVERSION N/A 05/15/2013   Procedure: TRANSESOPHAGEAL ECHOCARDIOGRAM (TEE);  Surgeon: Erick JONELLE Bergamo, MD;  Location: [facility] ENDOSCOPY;  Service: Cardiovascular;  Laterality: N/A;   thryoid biopsy     TONSILLECTOMY AND ADENOIDECTOMY  04/01/2014   TONSILLECTOMY AND ADENOIDECTOMY Bilateral 04/01/2014   Procedure: BILATERAL TONSILLECTOMY AND ADENOIDECTOMY;  Surgeon: Ana LELON Moccasin, MD;  Location: [facility] OR;  Service: ENT;  Laterality: Bilateral;   TOTAL ABDOMINAL HYSTERECTOMY  2001   TOTAL KNEE ARTHROPLASTY Right 11/03/2015   TOTAL KNEE ARTHROPLASTY Right 11/03/2015   Procedure: RIGHT TOTAL KNEE ARTHROPLASTY/RIGHT;  Surgeon: Lamar Millman, MD;  Location: [facility] OR;  Service: Orthopedics;  Laterality: Right;   TOTAL KNEE ARTHROPLASTY Left 02/07/2016   Procedure: TOTAL KNEE ARTHROPLASTY;   Surgeon: Lamar Millman, MD;  Location: [facility] OR;  Service: Orthopedics;  Laterality: Left;   TUBAL LIGATION  1990    Family History: Family History  Problem Relation Age of Onset   Ovarian cancer Mother    Diabetes Maternal Grandmother    Arthritis Maternal Grandmother    Diabetes Brother    Diabetes Paternal Grandmother    Diabetes Maternal Grandfather    Diabetes Paternal Grandfather    Heart disease Brother    Heart disease Other        Uncle   Breast cancer Maternal Aunt     Social History: Social History   Tobacco Use  Smoking Status Former   Current packs/day: 0.00   Average packs/day: 2.0 packs/day for 30.0 years (60.0 ttl pk-yrs)   Types: Cigarettes   Start date: 10/19/1969   Quit date: 10/20/1999   Years since quitting: 24.6  Smokeless Tobacco Never   Social History   Substance and Sexual Activity  Alcohol Use Yes   Comment: occ   Social History   Substance and Sexual Activity  Drug Use No    Allergies: Allergies  Allergen Reactions   Aquacel [Carboxymethylcellulose] Other (See Comments)    Blisters    Dextromethorphan Anaphylaxis   Nickel Rash    Turn read   Sulfonamide Derivatives Anaphylaxis, Hives and Swelling    Swelling of tongue   Aspirin  Nausea And Vomiting    Severe GI upset.  Pt states she can tolerate 81 mg asa only.    Ciprofloxacin Itching and Swelling    Angioedema, urticaria   Cosyntropin Hives and Swelling   Amoxicillin  Nausea And Vomiting    GI upset   Diclofenac Sodium Dermatitis    Gel causes legs to break out   Latex Rash    Blisters   Moxifloxacin Other (See Comments)    GI upset   Nsaids Other (See Comments)    Gi upset   Tape Other (See Comments) and Dermatitis    Blisters skin    Medications: Current Outpatient Medications  Medication Sig Dispense Refill   albuterol  (VENTOLIN  HFA) 108 (90 Base) MCG/ACT inhaler Inhale 1-2 puffs into the lungs every 6 (six) hours as needed for wheezing or shortness of breath.      ALPRAZolam  (XANAX ) 0.5 MG tablet Take 0.5 mg by mouth at bedtime.     amLODipine  (NORVASC ) 5 MG tablet Take 1 tablet (5 mg total) by mouth daily. 90 tablet 3   aspirin  EC 81 MG tablet Take 81 mg by mouth at bedtime.     azelastine (OPTIVAR) 0.05 % ophthalmic solution Place 1 drop into both eyes 2 (two) times daily.     dicyclomine  (  BENTYL ) 10 MG capsule Take 1 capsule (10 mg total) by mouth 4 (four) times daily -  before meals and at bedtime. 120 capsule 0   hyoscyamine  (LEVSIN  SL) 0.125 MG SL tablet Place 1 tablet (0.125 mg total) under the tongue every 6 (six) hours as needed. 60 tablet 1   ipratropium (ATROVENT ) 0.03 % nasal spray Place 2 sprays into the nose in the morning and at bedtime.     leflunomide (ARAVA) 10 MG tablet Take 1 tablet by mouth daily.     loratadine  (CLARITIN ) 10 MG tablet Take 10 mg by mouth.     nitroGLYCERIN  (NITROSTAT ) 0.4 MG SL tablet Place 1 tablet (0.4 mg total) under the tongue every 5 (five) minutes as needed for chest pain. 25 tablet 3   ondansetron  (ZOFRAN ) 4 MG tablet Take 1 tablet (4 mg total) by mouth daily as needed for nausea or vomiting. 30 tablet 1   OVER THE COUNTER MEDICATION Bio True eye drops daily.     pantoprazole  (PROTONIX ) 40 MG tablet TAKE 1 TABLET BY MOUTH DAILY 100 tablet 2   phentermine  15 MG capsule Take 15 mg by mouth daily.     pravastatin  (PRAVACHOL ) 40 MG tablet Take 40 mg by mouth daily.     PRESCRIPTION MEDICATION In Cholesterol study. Has injection once a month.has been in study for 3 years as of 12/07/23 and has 6 months to left in study. ( Will finish around end of August 2025 )     rizatriptan (MAXALT) 5 MG tablet Take 5 mg by mouth as needed for migraine. (not more than 3 a week). May repeat in 2 hours if needed     Sacrosidase  (SUCRAID ) 8500 UNIT/ML SOLN Take 2 mLs (17,000 Units total) by mouth with breakfast, with lunch, and with evening meal. Administer half the dose at the beginning of th meal or snack and other half during the  meals 180 mL 2   tiZANidine (ZANAFLEX) 4 MG tablet Take 1 tablet by mouth every 6 (six) hours as needed for muscle spasms.     topiramate  (TOPAMAX ) 50 MG tablet Take 50-100 mg by mouth 2 (two) times daily. 50 mg am and 100 mg pm     ursodiol  (ACTIGALL ) 300 MG capsule Take 1 capsule (300 mg total) by mouth 3 (three) times daily. 90 capsule 5   nitrofurantoin, macrocrystal-monohydrate, (MACROBID) 100 MG capsule Take 100 mg by mouth 2 (two) times daily.     No current facility-administered medications for this visit.    Review of Systems: GENERAL: negative for malaise, night sweats HEENT: No changes in hearing or vision, no nose bleeds or other nasal problems. NECK: Negative for lumps, goiter, pain and significant neck swelling RESPIRATORY: Negative for cough, wheezing CARDIOVASCULAR: Negative for chest pain, leg swelling, palpitations, orthopnea GI: SEE HPI MUSCULOSKELETAL: Negative for joint pain or swelling, back pain, and muscle pain. SKIN: Negative for lesions, rash HEMATOLOGY Negative for prolonged bleeding, bruising easily, and swollen nodes. ENDOCRINE: Negative for cold or heat intolerance, polyuria, polydipsia and goiter. NEURO: negative for tremor, gait imbalance, syncope and seizures. The remainder of the review of systems is noncontributory.   Physical Exam: BP 92/61   Pulse 63   Temp 97.9 F (36.6 C)   Ht 5' 5 (1.651 m)   Wt 158 lb 6.4 oz (71.8 kg)   BMI 26.36 kg/m  GENERAL: The patient is AO x3, in no acute distress. HEENT: Head is normocephalic and atraumatic. EOMI are intact. Mouth is well  hydrated and without lesions. NECK: Supple. No masses LUNGS: Clear to auscultation. No presence of rhonchi/wheezing/rales. Adequate chest expansion HEART: RRR, normal s1 and s2. ABDOMEN: Soft, nontender, no guarding, no peritoneal signs, and nondistended. BS +. No masses.  Imaging/Labs: as above     Latest Ref Rng & Units 03/20/2024    6:37 AM 03/19/2024   12:29 AM 02/29/2024     4:35 AM  CBC  WBC 4.0 - 10.5 K/uL 4.6  15.8  6.5   Hemoglobin 12.0 - 15.0 g/dL 86.3  82.5  85.4   Hematocrit 36.0 - 46.0 % 39.3  49.3  42.3   Platelets 150 - 400 K/uL 169  320  197    Lab Results  Component Value Date   IRON 115 10/03/2023   TIBC 312 10/03/2023   FERRITIN 252 10/03/2023    I personally reviewed and interpreted the available labs, imaging and endoscopic files.  Workup for diarrhea and abdominal pain     Negative SIBO testing done at [facility]             Normal gastrin level Normal fecal elastase level                 IMPRESSION: 1. Normal contour and caliber of the abdominal aorta. No evidence of aneurysm, dissection, or other acute aortic pathology. Moderate mixed calcific atherosclerosis. 2. Minimal noncalcific narrowing of the origin of the superior mesenteric artery with less than 50% stenosis. The vessel remains patent and opacified through its proximal order branches. 3. The descending colon, sigmoid colon, and rectum are decompressed although there is mild mucosal hyperenhancement. Colon is fluid-filled. Findings are consistent with nonspecific colitis. 4. Sigmoid diverticulosis without specific evidence of acute diverticulitis. 5. Hepatic steatosis. 6. Nonobstructive left nephrolithiasis 7. Status post hysterectomy.   Negative celiac panel 2022   Negative ASMA, HIV, ANA Negative hep C, negative hep B surface antigen, hep B nonimmune nonexposed Hep A immune Ferritin 252 INR 0.9 ALT 35 alk phos 96 AMA significantly 65.2    Liver biopsy   - Minimally active steatohepatitis (grade 0-1 of 3), see comment - No pathologic fibrosis    COMMENT:   The biopsy shows liver parenchyma with a preserved architecture. Hepatic lobules show moderate steatosis (about 35-40%) with minimal ballooning degeneration. Only isolated foci of lobular necroinflammation are seen.  Scattered glycogenated nuclei are present, suggestive of insulin  resistance.  Portal tracts are mostly unremarkable with preserved biliary and vascular structures and minimal inflammation. Iron stain shows minimal granular iron deposition within hepatocytes (0-1+) of uncertain significance. Trichrome and reticulin stains do not show any significant fibrosis. PASD stain shows no globular inclusions in hepatocytes. The findings are consistent with minimally active steatohepatitis, alcoholic and/or non-alcoholic, without any pathologic fibrosis.    ALP elevated for the first time : 122 ALT: 49 remains elevated  GGT : 28  IgM: 128 ( normal) Anti GP 210: negative   Impression and Plan:  [name] is a 63 y.o. female with epilepsy, COPD, CVA,  rheumatoid arthritis on adalimumab, PBC on ursodiol  (negative biopsy but elevated AMA,ALP and with clinical constellation) who presents for evaluation of  PBC. Postprandial diarrhea, recent new diagnosis of SID ( sucrolase isoenzyme deficiency)  # Postprandial diarrhea, intolerant to carbohydrate # SID ( sucrolase isoenzyme deficiency)  Patient had  CT finding of colitis of unknown etiology, biopsies negative GI PCR negative, CTA without any vascular compromise Normal gastrin level Normal fecal elastase level Negative celiac panel in 2022  Her main complaint was unable to tolerate most foods except protein.  Patient is unable to tolerate any carbohydrate including fruits and vegetables  Patient had extensive workup for postprandial abdominal pain and diarrhea where SIBO testing was negative, Sucrase activity was found to be low suggesting the SID  ( sucrolase isoenzyme deficiency).  Patient is known to have lactose intolerance for past many years  Recs:  -continue Sucrosidase (SUCRAID ) given positive test suggesting sucrase deficiency , patient recently was able to afford it as she received government assistance  -Lactose-free diet  -Handout for diet recommended for SID given   - Nutritional  consult  #IBS-C/CIC  Previously x-ray had significant stool burden patient was treated for constipation overflow diarrhea with Linzess   Patient continues to have constipation if she is not taking Linzess .  Unfortunately was not able to afford it .  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) Most likely cause    Patient's medication reviewed previously on leflunomide which was stopped  Liver biopsy only suggest steatohepatitis   Recs:   Given generalized pruritus , significant elevated AMA and slight elevation in ALP , although biopsies are negative for PBC-AIH ( this can be a patchy or very early disease) will treat clinically as PBC   Urosdiol will treat patient generalized pruritus and delay progression to end-stage renal disease and transplant if she truly has underlying PBC   Liver tests every 3?6 months TSH annually Bone mineral densitometry every 2 years Vitamins A, D, E and prothrombin time annually if bilirubin >2.0   -UDCA in a dose of 13 to 15 mg/kg/day orally      #MASLD   Risk factors for MASLD with  BMI 31 and prediabetes hemoglobin A1c 5.8 and mixed hyperlipidemia   Recommendation :     - walking at a brisk pace/biking at moderate intesity 2.5-5 hours per week     - use pedometer/step counter to track activity     - goal to lose 5-10% of initial body weight     - avoid suagry drinks and juices, use zero calorie beverages     - increase water  intake     - eat a low carb diet with plenty of veggies and fruit     - Get sufficient sleep  7-8 hrs nightly     - maitain active lifestyle     - avoid alcohol     - recommend 2-3 cups Coffee daily     - Counsel on lowering cholesterol by having a diet rich in vegetables,          protein (avoid red meats) and good fats(fish, salmon).   Last colonoscopy does not appear to be adequate for screening purposes, we will plan to repeat in 3 years  All questions were answered.      Jousha Schwandt Gwendolyn Detrell Umscheid, MD Gastroenterology and Hepatology [facility] Gastroenterology   This chart has been completed using [facility] Dictation software, and while attempts have been made to ensure accuracy , certain words and phrases may not be transcribed as intended

## 2024-06-04 NOTE — Patient Instructions (Signed)
 It was very nice to meet you today, as dicussed with will plan for the following :  1) see attached info  2) start amitiza   3) see dietician

## 2024-06-09 ENCOUNTER — Ambulatory Visit (INDEPENDENT_AMBULATORY_CARE_PROVIDER_SITE_OTHER): Admitting: Gastroenterology

## 2024-06-11 ENCOUNTER — Encounter (INDEPENDENT_AMBULATORY_CARE_PROVIDER_SITE_OTHER): Payer: Self-pay | Admitting: Gastroenterology

## 2024-06-20 NOTE — Progress Notes (Signed)
 Chief Complaint  Patient presents with  . Headache    Patient returns for assessment of hx headaches / seizures and discuss current medication plan. Headaches & seizures well controlled.  Accompanied by: NA  Depression screening score: 9 Pain level  0/10      The patient is a 63 y.o. right handed White or Caucasian   Female  With partial complex seizure disorder, staring spells/incontinence, last about 08/2021, no recent seizures, was having  frequent migraines since her teens, used to have visual aura, not bad, no bad headaches since last visit. CVA 2014 with left sided weakness, now resolved, headaches/migraines on left side , left facial numbness/coldness with Has, now rare. Has HAs with sex. She has been gaining weight. Was not able to afford Emgality/Ubrelvy. Taking Topamax  150 mg/d and has stopped Depakote  500 mg/d  Taking more made her sleepy. She drives. Feeling well.   The patient denies dizziness, weakness, nausea, vomiting, trauma.   Current Outpatient Medications:  .  albuterol  2.5 mg /3 mL (0.083 %) nebulizer solution, Take 3 mL (2.5 mg total) by nebulization every 4 (four) hours as needed for wheezing or shortness of breath Indications: bronchospasm prevention, chronic obstructive pulmonary disease., Disp: 25 each, Rfl: 11 .  albuterol  HFA (PROVENTIL  HFA;VENTOLIN  HFA;PROAIR  HFA) 90 mcg/actuation inhaler, USE 2 INHALATIONS BY MOUTH EVERY 4 TO 6 HOURS, Disp: 51 g, Rfl: 2 .  ALPRAZolam  (XANAX ) 0.5 mg tablet, Take 1 tablet (0.5 mg total) by mouth in the morning and 1 tablet (0.5 mg total) in the evening., Disp: 60 tablet, Rfl: 5 .  amLODIPine  (NORVASC ) 5 mg tablet, Take 5 mg by mouth Once Daily., Disp: , Rfl:  .  aspirin  81 mg EC tablet, Take  by mouth Once Daily., Disp: , Rfl:  .  azelastine (OPTIVAR) 0.05 % drop ophthalmic solution, 1 drop 2 (two) times a day., Disp: , Rfl:  .  dicyclomine  (BENTYL ) 10 mg capsule, Take 1 capsule (10 mg total) by mouth 4 (four) times a day., Disp:  360 capsule, Rfl: 1 .  hyoscyamine  (LEVSIN /SL) 0.125 mg sublingual tablet, Place 0.125 mg under the tongue., Disp: , Rfl:  .  ipratropium (ATROVENT ) 21 mcg (0.03 %) nasal spray, USE 2 SPRAYS IN BOTH NOSTRILS  TWICE DAILY, Disp: 90 mL, Rfl: 2 .  leflunomide (ARAVA) 10 mg tablet, TAKE 1 TABLET BY MOUTH DAILY, Disp: 60 tablet, Rfl: 5 .  loratadine  (CLARITIN ) 10 mg tablet, Take 10 mg by mouth daily., Disp: , Rfl:  .  lubiprostone  (AMITIZA ) 24 mcg capsule, Take 24 mcg by mouth in the morning and 24 mcg in the evening. Take with meals., Disp: , Rfl:  .  nitroglycerin  (NITROSTAT ) 0.4 mg SL tablet, Place 0.4 mg under the tongue every 5 (five) minutes as needed for chest pain., Disp: , Rfl:  .  ondansetron  (ZOFRAN ) 4 mg tablet, Take 4 mg by mouth every 8 (eight) hours as needed for nausea or vomiting., Disp: , Rfl:  .  pantoprazole  (PROTONIX ) 40 mg EC tablet, Take 40 mg by mouth daily., Disp: , Rfl:  .  phentermine  15 mg cap, Take 1 capsule (15 mg total) by mouth daily Indications: weight loss management for a person with obesity., Disp: 30 capsule, Rfl: 5 .  pravastatin  (PRAVACHOL ) 40 mg tablet, Take 1 tablet (40 mg total) by mouth daily., Disp: 100 tablet, Rfl: 2 .  rizatriptan (MAXALT) 5 mg tablet, Take 1 tablet (5 mg total) by mouth once as needed for migraine (not more than  3 a week). May repeat in 2 hours if needed, Disp: 12 tablet, Rfl: 2 .  sacrosidase  8,500 unit/mL soln, TID , Disp: , Rfl:  .  tiZANidine (ZANAFLEX) 4 mg tablet, Take 1 tablet (4 mg total) by mouth every 6 (six) hours as needed for muscle spasms., Disp: 30 tablet, Rfl: 0 .  topiramate  (TOPAMAX ) 50 mg tablet, TAKE 1 TABLET BY MOUTH IN THE  MORNING AND 2 TABLETS BY MOUTH  AT BEDTIME, Disp: 270 tablet, Rfl: 0 .  triamcinolone (KENALOG) 0.1 % ointment, APPLY TO AFFECTED AREA(S)  TOPICALLY TWICE DAILY, Disp: 454 g, Rfl: 5 .  ursodiol  (ACTIGALL ) 300 mg capsule, Take 300 mg by mouth 3 (three) times a day., Disp: , Rfl:    Past  Medical History:  Diagnosis Date  . Allergic   . Allergic rhinitis   . Anxiety   . Arthritis   . Asthma (CMD)   . Cataract   . Cerebral artery occlusion with cerebral infarction    (CMD) 06/03/2013  . Cerebral infarction    (CMD) 03/26/2013  . Cerebrovascular accident (CVA)    (CMD) 03/26/2013   Overview:  left sided weakness  . COPD (chronic obstructive pulmonary disease)    (CMD)   . Depression   . Derangement of meniscus 11/02/2008   Overview:   Qualifier: Diagnosis of   By: Margrette MD, Taft        . Eating disorder   . Eczema   . Fibromyositis 03/28/2016  . Headache   . Hypertension   . Inflammatory bowel disease   . Irritable bowel syndrome 06/23/2010   Overview:   Qualifier: Diagnosis of   By: Harvey MD, Sandi L       . Lumbar spondylosis 02/11/2021  . Myocardial infarction    (CMD)   . Obesity   . Osteoarthritis of knee 11/03/2015  . Osteoporosis   . Panic disorder 06/03/2013  . Prolonged QT interval 03/26/2013  . Pustular psoriasis of palms and soles 11/07/2021  . Recurrent ventral incisional hernia 04/19/2012  . Rheumatoid arthritis    (CMD)   . Scoliosis   . Seizures    (CMD)   . Stroke (cerebrum)    (CMD)   . Visual impairment      Past Surgical History:  Procedure Laterality Date  . ADENOIDECTOMY     Not sure  . APPENDECTOMY  1981   Procedure: APPENDECTOMY  . BRAIN SURGERY     Not sure  . CHOLECYSTECTOMY  1981   Procedure: CHOLECYSTECTOMY  . DENTAL SURGERY      Procedure: DENTAL SURGERY  . EYE SURGERY    . FOOT SURGERY Left 2004   Procedure: FOOT SURGERY  . FOOT TENDON SURGERY Right 1998   Procedure: FOOT TENDON SURGERY  . FRACTURE SURGERY    . HERNIA REPAIR    . HYSTERECTOMY   2000   Procedure: HYSTERECTOMY  . INGUINAL HERNIA REPAIR     Procedure: INGUINAL HERNIA REPAIR; 2 surgeries  . INGUINAL HERNIA REPAIR    . JOINT REPLACEMENT     X-2  . MIDDLE EAR SURGERY     Procedure: MIDDLE EAR SURGERY  . PARTIAL HYSTERECTOMY    . TOTAL  ABDOMINAL HYSTERECTOMY    . TOTAL KNEE ARTHROPLASTY Bilateral    Procedure: TOTAL KNEE ARTHROPLASTY  . TOTAL VAGINAL HYSTERECTOMY    . TUBAL LIGATION  05/1989   Procedure: TUBAL LIGATION    Patient Active Problem List  Diagnosis  . Osteoarthritis  .  Patent foramen ovale  . Restless legs syndrome  . Rheumatoid arthritis (HCC)  . Ventricular septal defect  . Obstructive sleep apnea  . History of CVA (cerebrovascular accident)  . COPD (chronic obstructive pulmonary disease) (HCC)  . Allergic asthma, moderate persistent, uncomplicated  . CMC arthritis  . De Quervain's disease (radial styloid tenosynovitis)  . Migraine without aura and without status migrainosus, not intractable  . Complex partial epilepsy (HCC)  . Dysfunction of right eustachian tube  . Trigger finger, left ring finger  . De Quervain's tenosynovitis, right  . Arthritis of carpometacarpal (CMC) joint of both thumbs  . Bilateral hand numbness    Allergies:  Adalimumab, Carboxymethylcellulose, Dextromethorphan, Nickel, Sulfa (sulfonamide antibiotics), Aspirin , Ciprofloxacin, Cosyntropin, Methotrexate  analogues, Adhesive, Augmentin  [amoxicillin -pot clavulanate], Lanolin, Nsaids (non-steroidal anti-inflammatory drug), Amoxicillin , Diclofenac sodium, Guaifenesin, Latex, Moxifloxacin, Surgical tape, and Tolmetin  A complete ROS was performed with pertinent positives and negatives noted in the HPI, and all others were negative except for above.  BP 110/70 (BP Location: Left arm, Patient Position: Sitting)   Pulse 61   Ht 1.638 m (5' 4.5)   Wt 68.6 kg (151 lb 4.8 oz)   SpO2 98%   BMI 25.57 kg/m   Mental status normal, the patient is awake, alert, oriented x 3, with fluent speech, normal fund of knowledge.  No carotid bruits.  CN 2 - 12 are normal, pupils equal, extra-occular movements full, face symmetric with no numbness.  Motor: all muscle groups 5/5 in upper and lower extremities, with no drift, normal  tone.  Sensation is intact to all modalities bilaterally.  Reflexes are 2+ in UEs, 2 in knees and 1 in ankles.   Cerebellar and gait are without ataxia.   Results for orders placed or performed in visit on 06/10/24  Comprehensive Metabolic Panel   Collection Time: 06/10/24  5:06 PM  Result Value Ref Range   Sodium 142 136 - 145 mmol/L   Potassium 4.2 3.5 - 5.1 mmol/L   Chloride 110 (H) 98 - 107 mmol/L   CO2 22 21 - 31 mmol/L   Anion Gap 10 6 - 14 mmol/L   Glucose, Random 97 70 - 99 mg/dL   Blood Urea Nitrogen (BUN) 9 7 - 25 mg/dL   Creatinine 9.49 (L) 9.39 - 1.20 mg/dL   eGFR >09 >40 fO/fpw/8.26f7   Albumin 4.7 3.5 - 5.7 g/dL   Total Protein 7.3 6.4 - 8.9 g/dL   Bilirubin, Total 0.7 0.3 - 1.0 mg/dL   Alkaline Phosphatase (ALP) 112 (H) 34 - 104 U/L   Aspartate Aminotransferase (AST) 23 13 - 39 U/L   Alanine Aminotransferase (ALT) 30 7 - 52 U/L   Calcium  9.8 8.6 - 10.3 mg/dL   BUN/Creatinine Ratio 18.0 10.0 - 20.0  Hemoglobin A1C With Estimated Average Glucose   Collection Time: 06/10/24  5:06 PM  Result Value Ref Range   Hemoglobin A1c 5.3 <5.7 %   Estimated Average Glucose 105 mg/dL  Lipid Panel   Collection Time: 06/10/24  5:06 PM  Result Value Ref Range   Cholesterol, Total, Lipid Panel 162 <200 mg/dL   Triglycerides, Lipid Panel 142 <150 mg/dL   HDL Cholesterol - Lipid Panel 41 (L) >=60 mg/dL   LDL Cholesterol, Calculated 97 <100 mg/dL   Non-HDL Cholesterol 878 mg/dL  TSH   Collection Time: 06/10/24  5:06 PM  Result Value Ref Range   TSH 2.059 0.450 - 5.330 uIU/mL  CBC with Differential   Collection Time: 06/10/24  5:06 PM  Result Value Ref Range   WBC 6.70 4.40 - 11.00 10*3/uL   RBC 4.87 4.10 - 5.10 10*6/uL   Hemoglobin 15.7 (H) 12.3 - 15.3 g/dL   Hematocrit 53.4 (H) 64.0 - 44.6 %   Mean Corpuscular Volume (MCV) 95.5 80.0 - 96.0 fL   Mean Corpuscular Hemoglobin (MCH) 32.2 27.5 - 33.2 pg   Mean Corpuscular Hemoglobin Conc (MCHC) 33.8 33.0 - 37.0 g/dL   Red  Cell Distribution Width (RDW) 12.9 12.3 - 17.0 %   Platelet Count (PLT) 197 150 - 450 10*3/uL   Mean Platelet Volume (MPV) 9.5 6.8 - 10.2 fL   Neutrophils % 58 %   Lymphocytes % 34 %   Monocytes % 6 %   Eosinophils % 1 %   Basophils % 1 %   nRBC % 0 %   Neutrophils Absolute 3.90 1.80 - 7.80 10*3/uL   Lymphocytes # 2.30 1.00 - 4.80 10*3/uL   Monocytes # 0.40 0.00 - 0.80 10*3/uL   Eosinophils # 0.10 0.00 - 0.50 10*3/uL   Basophils # 0.10 0.00 - 0.20 10*3/uL   nRBC Absolute 0.00 <=0.00 10*3/uL  Bilirubin, Direct   Collection Time: 06/10/24  5:06 PM  Result Value Ref Range   Bilirubin, Direct 0.2 0.0 - 0.2 mg/dL   EEG 87/88/76: Normal   MRI brain 09/02/14: [place]: Chronic infarct right parietal cortex unchanged  Right frontal cavernoma unchanged    EEGs [place] 09/27/15 and 08/19/15: Normal   MRI brain 10/13/22: 1. Stable brain when compared to 2016. 2. Right frontal cavernoma approaching cortex. Chronic right parietal cortex infarct.    IMPRESSION:  Seizures, partial onset, last about 08/2021 Hx R parietal CVA with left sided weakness 02/2013 Migraines  Liver problems. Can't afford Emgality/Ubrelvy      PLAN:  Continue Topamax /Maxalt    Follow up 1 year

## 2024-06-23 ENCOUNTER — Ambulatory Visit: Admitting: Nutrition

## 2024-06-24 ENCOUNTER — Encounter: Attending: Gastroenterology | Admitting: Nutrition

## 2024-06-24 DIAGNOSIS — E7431 Sucrase-isomaltase deficiency: Secondary | ICD-10-CM | POA: Insufficient documentation

## 2024-06-24 NOTE — Progress Notes (Signed)
 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 Hospitalization: May and June at [facility] for diverticulosis and Colitis. Had colonoscopy and Endoscopy. Has PBC-Primiary Biliary Cholangitis History of diarrhea, bloating, nausea, vomiti9ng Sees. Dr. Cinderella with GI and got recently diagnosed with CSID. Has been prescribed Sucraid  and tolerating it well. She feels it has helped.  Has lost 52 lbs since April 2025. Has been on a low weight loss medication also- Phentermine   Diet is very poor due to limited food intake and not well balanced meals due to GI issues and sucrase deficiency.. At risk for mild malnutrition.  Says she needs rotator cuff surgery. No schedule for surgery . Doing injections right now. Hands arthrititis, Carpal Tunnel in both hands.  Has been trying to drink some hot herbal teas and green tea.  Drinks water  Has chronic pain from her arthritis' and other chronic medical condition. Poor sleep habits , getting in only 3-4 hrs most nights. Takes  xanax  for anxiety and sleep aid.   She is willing to follow diet recommendations for CSID.  Clinical Medical Hx:  Past Medical History:  Diagnosis Date   Allergic rhinitis    uses Flonase  daily as needed and takes CLaritin  daily   Anxiety    takes Xanax  daily as needed   Asthma    Albuterol  inhaler prn;SYmbicort  daily   Cerebral vascular malformation    sees dr mavis for monitoring as needed, sees dr lewitt for headaches every 4 months   COPD (chronic obstructive pulmonary disease) (HCC)    Depression    takes Citalopram  daily   Diverticulitis at age 1   Eczema    Fibromyalgia    GERD (gastroesophageal reflux disease)    takes Protonix  daily   Hard of hearing    History of kidney  stones    History of migraine    last one 10+yrs ago   History of staph infection 62yrs ago   Hyperlipidemia    takes Pravastatin  daily   IBS (irritable bowel syndrome)    mixed   Insomnia    Joint pain    Lactose intolerance    Nausea    takes Zofran  daily as needed   PFO (patent foramen ovale)    Plaque psoriasis    PONV (postoperative nausea and vomiting)    Postoperative anemia due to acute blood loss 02/08/2016   Primary localized osteoarthritis of left knee    Primary localized osteoarthritis of right knee 11/03/2015   Rheumatoid arthritis(714.0) 10/16/2008   oa and ra;Rhemicade IV every 6wks and Metotrexate weekly   Seizures (HCC) 6months ago 03/21/14   takes Depakote  daily   Shortness of breath    with exertion   Sleep apnea    study done >57yrs ago;uses CPAP nightly   Spinal headache    patient states that she thinks she had a spinal headache a long time ago   Stress incontinence    Stroke (HCC) 03/26/2013   left sided weakness   Thyroid  cyst     Medications:  Current Outpatient Medications on File Prior to Visit  Medication Sig Dispense Refill   albuterol  (VENTOLIN  HFA) 108 (90 Base) MCG/ACT inhaler Inhale 1-2 puffs into the lungs every 6 (six) hours as needed for wheezing or shortness of breath.  ALPRAZolam  (XANAX ) 0.5 MG tablet Take 0.5 mg by mouth at bedtime.     amLODipine  (NORVASC ) 5 MG tablet Take 1 tablet (5 mg total) by mouth daily. 90 tablet 3   aspirin  EC 81 MG tablet Take 81 mg by mouth at bedtime.     azelastine (OPTIVAR) 0.05 % ophthalmic solution Place 1 drop into both eyes 2 (two) times daily.     dicyclomine  (BENTYL ) 10 MG capsule Take 1 capsule (10 mg total) by mouth 4 (four) times daily -  before meals and at bedtime. 120 capsule 0   hyoscyamine  (LEVSIN  SL) 0.125 MG SL tablet Place 1 tablet (0.125 mg total) under the tongue every 6 (six) hours as needed. 60 tablet 1   ipratropium (ATROVENT ) 0.03 % nasal spray Place 2 sprays into the nose in the  morning and at bedtime.     leflunomide (ARAVA) 10 MG tablet Take 1 tablet by mouth daily.     loratadine  (CLARITIN ) 10 MG tablet Take 10 mg by mouth.     lubiprostone  (AMITIZA ) 24 MCG capsule Take 1 capsule (24 mcg total) by mouth 2 (two) times daily with a meal. 60 capsule 5   nitroGLYCERIN  (NITROSTAT ) 0.4 MG SL tablet Place 1 tablet (0.4 mg total) under the tongue every 5 (five) minutes as needed for chest pain. 25 tablet 3   ondansetron  (ZOFRAN ) 4 MG tablet Take 1 tablet (4 mg total) by mouth daily as needed for nausea or vomiting. 30 tablet 1   OVER THE COUNTER MEDICATION Bio True eye drops daily.     pantoprazole  (PROTONIX ) 40 MG tablet TAKE 1 TABLET BY MOUTH DAILY 100 tablet 2   phentermine  15 MG capsule Take 15 mg by mouth daily.     pravastatin  (PRAVACHOL ) 40 MG tablet Take 40 mg by mouth daily.     PRESCRIPTION MEDICATION In Cholesterol study. Has injection once a month.has been in study for 3 years as of 12/07/23 and has 6 months to left in study. ( Will finish around end of August 2025 )     rizatriptan (MAXALT) 5 MG tablet Take 5 mg by mouth as needed for migraine. (not more than 3 a week). May repeat in 2 hours if needed     Sacrosidase  (SUCRAID ) 8500 UNIT/ML SOLN Take 2 mLs (17,000 Units total) by mouth with breakfast, with lunch, and with evening meal. Administer half the dose at the beginning of th meal or snack and other half during the meals 180 mL 2   tiZANidine (ZANAFLEX) 4 MG tablet Take 1 tablet by mouth every 6 (six) hours as needed for muscle spasms.     topiramate  (TOPAMAX ) 50 MG tablet Take 50-100 mg by mouth 2 (two) times daily. 50 mg am and 100 mg pm     ursodiol  (ACTIGALL ) 300 MG capsule Take 1 capsule (300 mg total) by mouth 3 (three) times daily. 90 capsule 5   nitrofurantoin, macrocrystal-monohydrate, (MACROBID) 100 MG capsule Take 100 mg by mouth 2 (two) times daily.     No current facility-administered medications on file prior to visit.    Labs:  Lab Results   Component Value Date   HGBA1C 5.6 03/27/2013      Latest Ref Rng & Units 03/24/2024    2:21 AM 03/23/2024    2:28 AM 03/22/2024    2:19 AM  CMP  Glucose 70 - 99 mg/dL 84  897  896   BUN 8 - 23 mg/dL 8  6  <5  Creatinine 0.44 - 1.00 mg/dL 9.57  9.56  9.54   Sodium 135 - 145 mmol/L 140  139  138   Potassium 3.5 - 5.1 mmol/L 3.7  3.7  3.4   Chloride 98 - 111 mmol/L 112  113  115   CO2 22 - 32 mmol/L 19  20  18    Calcium  8.9 - 10.3 mg/dL 9.0  8.8  8.7    Lipid Panel     Component Value Date/Time   CHOL 139 03/27/2013 0540   TRIG 135 03/27/2013 0540   HDL 23 (L) 03/27/2013 0540   CHOLHDL 6.0 03/27/2013 0540   VLDL 27 03/27/2013 0540   LDLCALC 89 03/27/2013 0540    Notable Signs/Symptoms: GI issues  Lifestyle & Dietary Hx LIves with her husband and grandon She does the cooking and shopping 62  Estimated daily fluid intake: 60 oz Supplements:   Sleep: 3-4 hrs  Stress / self-care:  Health issues Current average weekly physical activity: ADL  24-Hr Dietary Recall First Meal: 1130 am 1/2 bagel, 1 cup black tea no sugar Snack:  Second Meal:  Snack:  Third Meal: 9 pm  Eats late due to grandson karate. 1/2 grilled cheese sandwich and  pm  Snack:  Beverages: water . Hot tea.   Estimated Energy Needs Calories: 1500-1800 Carbohydrate: 170g Protein: 112g Fat: 42g   NUTRITION DIAGNOSIS  NI-5.4 Decreased nutrient needs (specify): sucrose and maltose As related to Sucrase deficiency.  As evidenced by bloating, diarrhea, and GI issues .   NUTRITION INTERVENTION  Nutrition education (E-1) on the following topics:  Sucrase Deficiency, signs, symptoms, treatment, reading food labels and food allowed and not allowed  Handouts Provided Include  Sucrase deficiency nutrition therapy  Learning Style & Readiness for Change Teaching method utilized: Visual & Auditory  Demonstrated degree of understanding via: Teach Back  Barriers to learning/adherence to lifestyle change:  None  Goals Established by Pt Goals  Follow up guidelines given for CSID See diet recommendations and same menus Keep food journal daily to include foods eaten, liquids drank and GI symptom Avoid foods not recommended.   MONITORING & EVALUATION Dietary intake, weekly physical activity, and GI habits in 1 month.  Next Steps  Patient is to work on avoiding foods of sucrose and maltose.SABRA

## 2024-06-24 NOTE — Patient Instructions (Signed)
 Goals  Follow up guidelines given for CSID See diet recommendations and same menus Keep food journal daily to include foods eaten, liquids drank and GI symptom Avoid foods not recommended.

## 2024-07-01 ENCOUNTER — Encounter: Payer: Self-pay | Admitting: Nutrition

## 2024-07-04 ENCOUNTER — Ambulatory Visit (INDEPENDENT_AMBULATORY_CARE_PROVIDER_SITE_OTHER): Admitting: Gastroenterology

## 2024-07-07 ENCOUNTER — Ambulatory Visit (INDEPENDENT_AMBULATORY_CARE_PROVIDER_SITE_OTHER): Admitting: Gastroenterology

## 2024-07-07 ENCOUNTER — Encounter (INDEPENDENT_AMBULATORY_CARE_PROVIDER_SITE_OTHER): Payer: Self-pay | Admitting: Gastroenterology

## 2024-07-07 VITALS — BP 121/76 | HR 88 | Temp 98.0°F | Ht 64.5 in | Wt 146.6 lb

## 2024-07-07 DIAGNOSIS — K581 Irritable bowel syndrome with constipation: Secondary | ICD-10-CM

## 2024-07-07 DIAGNOSIS — R768 Other specified abnormal immunological findings in serum: Secondary | ICD-10-CM

## 2024-07-07 DIAGNOSIS — K5904 Chronic idiopathic constipation: Secondary | ICD-10-CM

## 2024-07-07 DIAGNOSIS — R748 Abnormal levels of other serum enzymes: Secondary | ICD-10-CM

## 2024-07-07 DIAGNOSIS — L299 Pruritus, unspecified: Secondary | ICD-10-CM | POA: Diagnosis not present

## 2024-07-07 DIAGNOSIS — K760 Fatty (change of) liver, not elsewhere classified: Secondary | ICD-10-CM | POA: Diagnosis not present

## 2024-07-07 DIAGNOSIS — K743 Primary biliary cirrhosis: Secondary | ICD-10-CM

## 2024-07-07 DIAGNOSIS — E7431 Sucrase-isomaltase deficiency: Secondary | ICD-10-CM | POA: Diagnosis not present

## 2024-07-07 MED ORDER — HYDROCORTISONE ACETATE 25 MG RE SUPP: 25.0000 mg | Freq: Two times a day (BID) | RECTAL | 0 refills | Status: DC

## 2024-07-07 MED ORDER — HYDROXYZINE HCL 10 MG PO TABS: 10.0000 mg | ORAL_TABLET | Freq: Every evening | ORAL | 0 refills | Status: AC | PRN

## 2024-07-07 NOTE — Patient Instructions (Signed)
 It was very nice to meet you today, as dicussed with will plan for the following :  1) Anusol  supository  2) Colorectal surgeon referral

## 2024-07-07 NOTE — Progress Notes (Signed)
 [name] , M.D. Gastroenterology & Hepatology [facility] [address] [place], [place] [address] Primary Care Physician: Debrah Josette ORN., PA-C [address] [place] [address]  Chief Complaint: sucrolase isoenzyme deficiency, PBC   History of Present Illness: [name] is a 63 y.o. female with epilepsy, COPD, CVA,  rheumatoid arthritis on adalimumab, PBC on ursodiol  (negative biopsy but elevated AMA,ALP and with clinical constellation) who presents for evaluation of  PBC. Postprandial diarrhea, recent new diagnosis of SID ( sucrolase isoenzyme deficiency)  Patient had recurrent hospitalization for abdominal pain, diarrhea, colitis where she underwent upper endoscopy and colonoscopy.  Upper endoscopy demonstrated abnormal gastric mucosa biopsies consistent with chronic active gastritis and mild acute duodenitis.  Colon segmental biopsies were negative.CT at that time demonstrated persistent mild colitis  involving splenic flexure and descending colon.CTA without significant vascular compromise .  GI PCR was previously negative  Patient had extensive workup for postprandial abdominal pain and diarrhea where SIBO testing was negative, normal fecal elastase .  Sucrase activity was found to be low suggesting the SID  ( sucrolase isoenzyme deficiency).  Patient is known to have lactose intolerance for past many years    Reports her main issue remains frequent bowel movements which is liquid in consistency 5-10 episodes daily.  Bowel movements are usually postprandial and patient reports she is unable to tolerate fruit vegetables or any other food except.  Protein such as lean hamburgers or grilled chicken.  Patient reports bloating and rumbling sounds from her abdomen after food ingestion Patient main issue remains solid food going straight through her body after eating.  Patient symptoms are very much controlled if she only eats  meat(protein ) and no carbs   Although patient reports if she does not take Linzess  she will be constipated for 2 to 3 days upon which she takes Linzess , due to insurance issues this was recently changed to Amitiza  which patient has been tolerating very well  Today The patient has been very concerned about protruding painful anal lesion  Patient was initially seen in our GI clinic for elevated liver enzymes and was seen Last EGD:03/2025 - Normal esophagus. Dilated. Abnormal gastric mucosa of uncertain significance - status post biopsy - Small hiatal hernia.  Last Colonoscopy:03/2025  - Edematous left colon as described. Status post segmental biopsy. .  A. GASTRIC BIOPSY:  Chronic active gastritis with reactive epithelial changes  Helicobacter stain negative (IHC, adequate control)  Negative for intestinal metaplasia, dysplasia and carcinoma   B. DUODENAL BIOPSY:  Mild acute duodenitis   C. ASCENDING COLON, BIOPSY:  Benign colonic mucosa with no diagnostic abnormality   D. TRANSVERSE COLON, BIOPSY:  Benign colonic mucosa with no diagnostic abnormality   E. DESCENDING COLON, BIOPSY:  Benign colonic mucosa with no diagnostic abnormality   F. SIGMOID COLON, BIOPSY:  Benign colonic mucosa with no diagnostic abnormality   G. RECTUM, BIOPSY:  Benign colonic mucosa with no diagnostic abnormality   Past Medical History: Past Medical History:  Diagnosis Date   Allergic rhinitis    uses Flonase  daily as needed and takes CLaritin  daily   Anxiety    takes Xanax  daily as needed   Asthma    Albuterol  inhaler prn;SYmbicort  daily   Cerebral vascular malformation    sees dr mavis for monitoring as needed, sees dr lewitt for headaches every 4 months   COPD (chronic obstructive pulmonary disease) (HCC)    Depression    takes Citalopram  daily  Diverticulitis at age 62   Eczema    Fibromyalgia    GERD (gastroesophageal reflux disease)    takes Protonix  daily   Hard of hearing     History of kidney stones    History of migraine    last one 10+yrs ago   History of staph infection 45yrs ago   Hyperlipidemia    takes Pravastatin  daily   IBS (irritable bowel syndrome)    mixed   Insomnia    Joint pain    Lactose intolerance    Nausea    takes Zofran  daily as needed   PFO (patent foramen ovale)    Plaque psoriasis    PONV (postoperative nausea and vomiting)    Postoperative anemia due to acute blood loss 02/08/2016   Primary localized osteoarthritis of left knee    Primary localized osteoarthritis of right knee 11/03/2015   Rheumatoid arthritis(714.0) 10/16/2008   oa and ra;Rhemicade IV every 6wks and Metotrexate weekly   Seizures (HCC) 6months ago 03/21/14   takes Depakote  daily   Shortness of breath    with exertion   Sleep apnea    study done >62yrs ago;uses CPAP nightly   Spinal headache    patient states that she thinks she had a spinal headache a long time ago   Stress incontinence    Stroke (HCC) 03/26/2013   left sided weakness   Thyroid  cyst     Past Surgical History: Past Surgical History:  Procedure Laterality Date   APPENDECTOMY  1981   BIOPSY  12/16/2019   Procedure: BIOPSY;  Surgeon: Kristie Lamprey, MD;  Location: [facility];  Service: Endoscopy;;   BIOPSY  07/19/2023   Procedure: BIOPSY;  Surgeon: Cinderella Deatrice FALCON, MD;  Location: [facility];  Service: Endoscopy;;   CARDIAC CATHETERIZATION  2004   CHOLECYSTECTOMY  1981   COLONOSCOPY     COLONOSCOPY N/A 03/21/2024   Procedure: COLONOSCOPY;  Surgeon: Shaaron Lamar HERO, MD;  Location: [facility];  Service: Endoscopy;  Laterality: N/A;   COLONOSCOPY WITH PROPOFOL  N/A 12/16/2019   Procedure: COLONOSCOPY WITH PROPOFOL ;  Surgeon: Kristie Lamprey, MD;  Location: [facility];  Service: Endoscopy;  Laterality: N/A;   CORONARY PRESSURE/FFR STUDY N/A 07/25/2022   Procedure: INTRAVASCULAR PRESSURE WIRE/FFR STUDY;  Surgeon: Elmira Newman PARAS, MD;  Location: [facility];  Service: Cardiovascular;   Laterality: N/A;   ESOPHAGOGASTRODUODENOSCOPY     ESOPHAGOGASTRODUODENOSCOPY N/A 03/21/2024   Procedure: EGD (ESOPHAGOGASTRODUODENOSCOPY);  Surgeon: Shaaron Lamar HERO, MD;  [facility]: [facility];  Service: Endoscopy;  Laterality: N/A;   ESOPHAGOGASTRODUODENOSCOPY (EGD) WITH PROPOFOL  N/A 07/19/2023   Procedure: ESOPHAGOGASTRODUODENOSCOPY (EGD) WITH PROPOFOL ;  Surgeon: Cinderella Deatrice FALCON, MD;  [facility]: [facility];  Service: Endoscopy;  Laterality: N/A;  12:45;asa 1-2, bumped to 1:00 for lunch to fit - messaged Tanya   FOOT SURGERY  1999   right ankle   HERNIA REPAIR  2006   x2   INCISIONAL HERNIA REPAIR  05/20/2012   Procedure: HERNIA REPAIR INCISIONAL;  Surgeon: Debby LABOR. Cornett, MD;  Location: [facility];  Service: General;  Laterality: N/A;   INCONTINENCE SURGERY  2010   sling done    KIDNEY STONE SURGERY  2001   KNEE ARTHROSCOPY  1992   left   left foot plating and scarping for arthritis  2011   LEFT HEART CATH AND CORONARY ANGIOGRAPHY N/A 07/25/2022   Procedure: LEFT HEART CATH AND CORONARY ANGIOGRAPHY;  Surgeon: Elmira Newman PARAS, MD;  [facility]: [facility];  Service: Cardiovascular;  Laterality: N/A;   POLYPECTOMY  07/19/2023   Procedure: POLYPECTOMY;  Surgeon: Cinderella Deatrice FALCON, MD;  [facility];  Service: Endoscopy;;   right ear tube insertion  2011   TEE WITHOUT CARDIOVERSION N/A 05/15/2013   Procedure: TRANSESOPHAGEAL ECHOCARDIOGRAM (TEE);  Surgeon: Erick JONELLE Bergamo, MD;  Location: [facility] ENDOSCOPY;  Service: Cardiovascular;  Laterality: N/A;   thryoid biopsy     TONSILLECTOMY AND ADENOIDECTOMY  04/01/2014   TONSILLECTOMY AND ADENOIDECTOMY Bilateral 04/01/2014   Procedure: BILATERAL TONSILLECTOMY AND ADENOIDECTOMY;  Surgeon: Ana LELON Moccasin, MD;  Location: [facility] OR;  Service: ENT;  Laterality: Bilateral;   TOTAL ABDOMINAL HYSTERECTOMY  2001   TOTAL KNEE ARTHROPLASTY Right 11/03/2015   TOTAL KNEE ARTHROPLASTY Right 11/03/2015   Procedure: RIGHT TOTAL KNEE ARTHROPLASTY/RIGHT;   Surgeon: Lamar Millman, MD;  Location: [facility] OR;  Service: Orthopedics;  Laterality: Right;   TOTAL KNEE ARTHROPLASTY Left 02/07/2016   Procedure: TOTAL KNEE ARTHROPLASTY;  Surgeon: Lamar Millman, MD;  Location: [facility] OR;  Service: Orthopedics;  Laterality: Left;   TUBAL LIGATION  1990    Family History: Family History  Problem Relation Age of Onset   Ovarian cancer Mother    Diabetes Maternal Grandmother    Arthritis Maternal Grandmother    Diabetes Brother    Diabetes Paternal Grandmother    Diabetes Maternal Grandfather    Diabetes Paternal Grandfather    Heart disease Brother    Heart disease Other        Uncle   Breast cancer Maternal Aunt     Social History: Social History   Tobacco Use  Smoking Status Former   Current packs/day: 0.00   Average packs/day: 2.0 packs/day for 30.0 years (60.0 ttl pk-yrs)   Types: Cigarettes   Start date: 10/19/1969   Quit date: 10/20/1999   Years since quitting: 24.7  Smokeless Tobacco Never   Social History   Substance and Sexual Activity  Alcohol Use Yes   Comment: occ   Social History   Substance and Sexual Activity  Drug Use No    Allergies: Allergies  Allergen Reactions   Aquacel [Carboxymethylcellulose] Other (See Comments)    Blisters    Dextromethorphan Anaphylaxis   Nickel Rash    Turn read   Sulfonamide Derivatives Anaphylaxis, Hives and Swelling    Swelling of tongue   Aspirin  Nausea And Vomiting    Severe GI upset.  Pt states she can tolerate 81 mg asa only.    Ciprofloxacin Itching and Swelling    Angioedema, urticaria   Cosyntropin Hives and Swelling   Amoxicillin  Nausea And Vomiting    GI upset   Diclofenac Sodium Dermatitis    Gel causes legs to break out   Latex Rash    Blisters   Moxifloxacin Other (See Comments)    GI upset   Nsaids Other (See Comments)    Gi upset   Tape Other (See Comments) and Dermatitis    Blisters skin    Medications: Current Outpatient Medications  Medication Sig  Dispense Refill   albuterol  (VENTOLIN  HFA) 108 (90 Base) MCG/ACT inhaler Inhale 1-2 puffs into the lungs every 6 (six) hours as needed for wheezing or shortness of breath.     ALPRAZolam  (XANAX ) 0.5 MG tablet Take 0.5 mg by mouth at bedtime.     amLODipine  (NORVASC ) 5 MG tablet Take 1 tablet (5 mg total) by mouth daily. 90 tablet 3   aspirin  EC 81 MG tablet Take 81 mg by  mouth at bedtime.     azelastine (OPTIVAR) 0.05 % ophthalmic solution Place 1 drop into both eyes 2 (two) times daily.     dicyclomine  (BENTYL ) 10 MG capsule Take 1 capsule (10 mg total) by mouth 4 (four) times daily -  before meals and at bedtime. (Patient taking differently: Take 10 mg by mouth 4 (four) times daily.) 120 capsule 0   hyoscyamine  (LEVSIN  SL) 0.125 MG SL tablet Place 1 tablet (0.125 mg total) under the tongue every 6 (six) hours as needed. 60 tablet 1   ipratropium (ATROVENT ) 0.03 % nasal spray Place 2 sprays into the nose in the morning and at bedtime.     loratadine  (CLARITIN ) 10 MG tablet Take 10 mg by mouth.     lubiprostone  (AMITIZA ) 24 MCG capsule Take 1 capsule (24 mcg total) by mouth 2 (two) times daily with a meal. 60 capsule 5   nitroGLYCERIN  (NITROSTAT ) 0.4 MG SL tablet Place 1 tablet (0.4 mg total) under the tongue every 5 (five) minutes as needed for chest pain. 25 tablet 3   ondansetron  (ZOFRAN ) 4 MG tablet Take 1 tablet (4 mg total) by mouth daily as needed for nausea or vomiting. 30 tablet 1   OVER THE COUNTER MEDICATION Bio True eye drops daily.     pantoprazole  (PROTONIX ) 40 MG tablet TAKE 1 TABLET BY MOUTH DAILY 100 tablet 2   pravastatin  (PRAVACHOL ) 40 MG tablet Take 40 mg by mouth daily.     PRESCRIPTION MEDICATION In Cholesterol study. Has injection once a month.has been in study for 3 years as of 12/07/23 and has 6 months to left in study. ( Will finish around end of August 2025 )     rizatriptan (MAXALT) 5 MG tablet Take 5 mg by mouth as needed for migraine. (not more than 3 a week). May repeat  in 2 hours if needed     Sacrosidase  (SUCRAID ) 8500 UNIT/ML SOLN Take 2 mLs (17,000 Units total) by mouth with breakfast, with lunch, and with evening meal. Administer half the dose at the beginning of th meal or snack and other half during the meals (Patient taking differently: Take 2 mLs by mouth with breakfast, with lunch, and with evening meal. Administer half the dose at the beginning of th meal or snack and other half during the meals) 180 mL 2   tiZANidine (ZANAFLEX) 4 MG tablet Take 1 tablet by mouth every 6 (six) hours as needed for muscle spasms.     topiramate  (TOPAMAX ) 50 MG tablet Take 50-100 mg by mouth 2 (two) times daily. 50 mg am and 100 mg pm     ursodiol  (ACTIGALL ) 300 MG capsule Take 1 capsule (300 mg total) by mouth 3 (three) times daily. 90 capsule 5   No current facility-administered medications for this visit.    Review of Systems: GENERAL: negative for malaise, night sweats HEENT: No changes in hearing or vision, no nose bleeds or other nasal problems. NECK: Negative for lumps, goiter, pain and significant neck swelling RESPIRATORY: Negative for cough, wheezing CARDIOVASCULAR: Negative for chest pain, leg swelling, palpitations, orthopnea GI: SEE HPI MUSCULOSKELETAL: Negative for joint pain or swelling, back pain, and muscle pain. SKIN: Negative for lesions, rash HEMATOLOGY Negative for prolonged bleeding, bruising easily, and swollen nodes. ENDOCRINE: Negative for cold or heat intolerance, polyuria, polydipsia and goiter. NEURO: negative for tremor, gait imbalance, syncope and seizures. The remainder of the review of systems is noncontributory.   Physical Exam: BP 121/76 (BP Location: Right Arm,  Patient Position: Sitting, Cuff Size: Normal)   Pulse 88   Temp 98 F (36.7 C) (Oral)   Ht 5' 4.5 (1.638 m)   Wt 146 lb 9.6 oz (66.5 kg)   BMI 24.78 kg/m  GENERAL: The patient is AO x3, in no acute distress. HEENT: Head is normocephalic and atraumatic. EOMI are  intact. Mouth is well hydrated and without lesions. NECK: Supple. No masses LUNGS: Clear to auscultation. No presence of rhonchi/wheezing/rales. Adequate chest expansion HEART: RRR, normal s1 and s2. ABDOMEN: Soft, nontender, no guarding, no peritoneal signs, and nondistended. BS +. No masses. RECTAL EXAM-DRE :   pearly white anal solid lesion which protrudes out and was able to be pushed back to the anal canal, atypical proximal contact, hemorrhoids.  No fissure or bleeding was encountered  Imaging/Labs: as above     Latest Ref Rng & Units 03/20/2024    6:37 AM 03/19/2024   12:29 AM 02/29/2024    4:35 AM  CBC  WBC 4.0 - 10.5 K/uL 4.6  15.8  6.5   Hemoglobin 12.0 - 15.0 g/dL 86.3  82.5  85.4   Hematocrit 36.0 - 46.0 % 39.3  49.3  42.3   Platelets 150 - 400 K/uL 169  320  197    Lab Results  Component Value Date   IRON 115 10/03/2023   TIBC 312 10/03/2023   FERRITIN 252 10/03/2023    I personally reviewed and interpreted the available labs, imaging and endoscopic files.  Workup for diarrhea and abdominal pain     Negative SIBO testing done at [facility]             Normal gastrin level Normal fecal elastase level                 IMPRESSION: 1. Normal contour and caliber of the abdominal aorta. No evidence of aneurysm, dissection, or other acute aortic pathology. Moderate mixed calcific atherosclerosis. 2. Minimal noncalcific narrowing of the origin of the superior mesenteric artery with less than 50% stenosis. The vessel remains patent and opacified through its proximal order branches. 3. The descending colon, sigmoid colon, and rectum are decompressed although there is mild mucosal hyperenhancement. Colon is fluid-filled. Findings are consistent with nonspecific colitis. 4. Sigmoid diverticulosis without specific evidence of acute diverticulitis. 5. Hepatic steatosis. 6. Nonobstructive left nephrolithiasis 7. Status post hysterectomy.   Negative celiac  panel 2022   Negative ASMA, HIV, ANA Negative hep C, negative hep B surface antigen, hep B nonimmune nonexposed Hep A immune Ferritin 252 INR 0.9 ALT 35 alk phos 96 AMA significantly 65.2    Liver biopsy   - Minimally active steatohepatitis (grade 0-1 of 3), see comment - No pathologic fibrosis    COMMENT:   The biopsy shows liver parenchyma with a preserved architecture. Hepatic lobules show moderate steatosis (about 35-40%) with minimal ballooning degeneration. Only isolated foci of lobular necroinflammation are seen.  Scattered glycogenated nuclei are present, suggestive of insulin resistance.  Portal tracts are mostly unremarkable with preserved biliary and vascular structures and minimal inflammation. Iron stain shows minimal granular iron deposition within hepatocytes (0-1+) of uncertain significance. Trichrome and reticulin stains do not show any significant fibrosis. PASD stain shows no globular inclusions in hepatocytes. The findings are consistent with minimally active steatohepatitis, alcoholic and/or non-alcoholic, without any pathologic fibrosis.    ALP elevated for the first time : 122 ALT: 49 remains elevated  GGT : 28  IgM: 128 ( normal) Anti GP 210: negative  Impression and Plan:  Averee A Que is a 63 y.o. female with epilepsy, COPD, CVA,  rheumatoid arthritis on adalimumab, PBC on ursodiol  (negative biopsy but elevated AMA,ALP and with clinical constellation) who presents for evaluation of  PBC. Postprandial diarrhea, recent new diagnosis of SID ( sucrolase isoenzyme deficiency)  #Anal lesion  The patient has been very concerned about protruding painful anal lesion  Rectal exam done today in the clinic with pearly white anal lesion which protrudes out and was able to be pushed back to the anal canal, atypical proximal contact, hemorrhoids  Given patient's symptomatic we will refer the patient to colorectal surgeon for further management  Will give  a trial of Anusol  suppository    # Postprandial diarrhea, intolerant to carbohydrate # SID ( sucrolase isoenzyme deficiency)  Patient had  CT finding of colitis of unknown etiology, biopsies negative GI PCR negative, CTA without any vascular compromise Normal gastrin level Normal fecal elastase level Negative celiac panel in 2022  Her main complaint was unable to tolerate most foods except protein.  Patient is unable to tolerate any carbohydrate including fruits and vegetables  Patient had extensive workup for postprandial abdominal pain and diarrhea where SIBO testing was negative, Sucrase activity was found to be low suggesting the SID  ( sucrolase isoenzyme deficiency).  Patient is known to have lactose intolerance for past many years  Recs:  -continue Sucrosidase (SUCRAID ) given positive test suggesting sucrase deficiency , patient recently was able to afford it as she received government assistance  -Lactose-free diet  -Handout for diet recommended for SID given   -following with Nutrition  #IBS-C/CIC  Previously x-ray had significant stool burden patient was treated for constipation overflow diarrhea with Linzess   Patient continues to have constipation if she is not taking Linzess .  Unfortunately was not able to afford it .  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) Most likely cause    Patient's medication reviewed previously on leflunomide which was stopped  Liver biopsy only suggest steatohepatitis    Recs:   Given generalized pruritus , significant elevated AMA and slight elevation in ALP , although biopsies are negative for PBC-AIH ( this can be a patchy or very early disease) will treat clinically as PBC   Urosdiol will treat patient generalized pruritus and delay progression to end-stage renal disease and transplant if she truly has underlying PBC   Liver tests every 3?6 months TSH annually Bone mineral densitometry every 2 years Vitamins A, D, E and prothrombin time annually if bilirubin >2.0   -UDCA in a dose of 13 to 15 mg/kg/day orally      #MASLD   Risk factors for MASLD with  BMI 31 and prediabetes hemoglobin A1c 5.8 and mixed hyperlipidemia   Recommendation :     - walking at a brisk pace/biking at moderate intesity 2.5-5 hours per week     - use pedometer/step counter to track activity     - goal to lose 5-10% of initial body weight     - avoid suagry drinks and juices, use zero calorie beverages     -  increase water  intake     - eat a low carb diet with plenty of veggies and fruit     - Get sufficient sleep 7-8 hrs nightly     - maitain active lifestyle     - avoid alcohol     - recommend 2-3 cups Coffee daily     - Counsel on lowering cholesterol by having a diet rich in vegetables,          protein (avoid red meats) and good fats(fish, salmon).   Last colonoscopy does not appear to be adequate for screening purposes, we will plan to repeat in 3 years  #Pruritus Likely constellation of PBC , Remain followed by rifampicin, SSRI for opioid antagonist Will start with hydroxyzine  trail at night    All questions were answered.      Theodore Virgin Faizan Yunis Voorheis, MD Gastroenterology and Hepatology [facility]   This chart has been completed using [facility] Dictation software, and while attempts have been made to ensure accuracy , certain words and phrases may not be transcribed as intended

## 2024-07-14 ENCOUNTER — Encounter (INDEPENDENT_AMBULATORY_CARE_PROVIDER_SITE_OTHER): Payer: Self-pay | Admitting: Gastroenterology

## 2024-07-15 MED ORDER — HYDROCORTISONE ACETATE 25 MG RE SUPP: 25.0000 mg | Freq: Two times a day (BID) | RECTAL | 0 refills | Status: DC

## 2024-07-22 ENCOUNTER — Encounter (HOSPITAL_COMMUNITY)
Admission: EM | Disposition: A | Discharge status: Home Health | Payer: Self-pay | Source: Home / Self Care | Attending: Internal Medicine

## 2024-07-22 ENCOUNTER — Encounter: Admitting: Nutrition

## 2024-07-22 ENCOUNTER — Emergency Department (HOSPITAL_COMMUNITY)

## 2024-07-22 ENCOUNTER — Emergency Department (HOSPITAL_COMMUNITY): Admitting: Anesthesiology

## 2024-07-22 ENCOUNTER — Other Ambulatory Visit: Payer: Self-pay

## 2024-07-22 ENCOUNTER — Encounter (HOSPITAL_COMMUNITY): Payer: Self-pay

## 2024-07-22 ENCOUNTER — Inpatient Hospital Stay (HOSPITAL_COMMUNITY)
Admission: EM | Admit: 2024-07-22 | Discharge: 2024-07-26 | DRG: 854 | Disposition: A | Discharge status: Home Health | Attending: Internal Medicine | Admitting: Internal Medicine

## 2024-07-22 DIAGNOSIS — Z91048 Other nonmedicinal substance allergy status: Secondary | ICD-10-CM

## 2024-07-22 DIAGNOSIS — N136 Pyonephrosis: Secondary | ICD-10-CM | POA: Diagnosis present

## 2024-07-22 DIAGNOSIS — Z888 Allergy status to other drugs, medicaments and biological substances status: Secondary | ICD-10-CM

## 2024-07-22 DIAGNOSIS — M25511 Pain in right shoulder: Secondary | ICD-10-CM | POA: Diagnosis present

## 2024-07-22 DIAGNOSIS — M25512 Pain in left shoulder: Secondary | ICD-10-CM | POA: Diagnosis present

## 2024-07-22 DIAGNOSIS — G40209 Localization-related (focal) (partial) symptomatic epilepsy and epileptic syndromes with complex partial seizures, not intractable, without status epilepticus: Secondary | ICD-10-CM | POA: Diagnosis present

## 2024-07-22 DIAGNOSIS — A419 Sepsis, unspecified organism: Secondary | ICD-10-CM | POA: Diagnosis present

## 2024-07-22 DIAGNOSIS — Z833 Family history of diabetes mellitus: Secondary | ICD-10-CM | POA: Diagnosis not present

## 2024-07-22 DIAGNOSIS — E876 Hypokalemia: Secondary | ICD-10-CM | POA: Diagnosis present

## 2024-07-22 DIAGNOSIS — K219 Gastro-esophageal reflux disease without esophagitis: Secondary | ICD-10-CM | POA: Diagnosis present

## 2024-07-22 DIAGNOSIS — A4151 Sepsis due to Escherichia coli [E. coli]: Principal | ICD-10-CM | POA: Diagnosis present

## 2024-07-22 DIAGNOSIS — F32A Depression, unspecified: Secondary | ICD-10-CM | POA: Diagnosis present

## 2024-07-22 DIAGNOSIS — K2970 Gastritis, unspecified, without bleeding: Secondary | ICD-10-CM | POA: Diagnosis present

## 2024-07-22 DIAGNOSIS — Z8673 Personal history of transient ischemic attack (TIA), and cerebral infarction without residual deficits: Secondary | ICD-10-CM | POA: Diagnosis not present

## 2024-07-22 DIAGNOSIS — F419 Anxiety disorder, unspecified: Secondary | ICD-10-CM | POA: Diagnosis present

## 2024-07-22 DIAGNOSIS — N390 Urinary tract infection, site not specified: Secondary | ICD-10-CM | POA: Diagnosis present

## 2024-07-22 DIAGNOSIS — Z8041 Family history of malignant neoplasm of ovary: Secondary | ICD-10-CM

## 2024-07-22 DIAGNOSIS — R109 Unspecified abdominal pain: Secondary | ICD-10-CM | POA: Diagnosis not present

## 2024-07-22 DIAGNOSIS — Z1611 Resistance to penicillins: Secondary | ICD-10-CM | POA: Diagnosis present

## 2024-07-22 DIAGNOSIS — N23 Unspecified renal colic: Secondary | ICD-10-CM | POA: Diagnosis present

## 2024-07-22 DIAGNOSIS — N201 Calculus of ureter: Secondary | ICD-10-CM | POA: Diagnosis not present

## 2024-07-22 DIAGNOSIS — R748 Abnormal levels of other serum enzymes: Secondary | ICD-10-CM | POA: Diagnosis present

## 2024-07-22 DIAGNOSIS — Z803 Family history of malignant neoplasm of breast: Secondary | ICD-10-CM

## 2024-07-22 DIAGNOSIS — M069 Rheumatoid arthritis, unspecified: Secondary | ICD-10-CM | POA: Diagnosis present

## 2024-07-22 DIAGNOSIS — M797 Fibromyalgia: Secondary | ICD-10-CM | POA: Diagnosis present

## 2024-07-22 DIAGNOSIS — Z8249 Family history of ischemic heart disease and other diseases of the circulatory system: Secondary | ICD-10-CM

## 2024-07-22 DIAGNOSIS — Z96653 Presence of artificial knee joint, bilateral: Secondary | ICD-10-CM | POA: Diagnosis present

## 2024-07-22 DIAGNOSIS — Z743 Need for continuous supervision: Secondary | ICD-10-CM | POA: Diagnosis not present

## 2024-07-22 DIAGNOSIS — J4489 Other specified chronic obstructive pulmonary disease: Secondary | ICD-10-CM | POA: Diagnosis present

## 2024-07-22 DIAGNOSIS — I10 Essential (primary) hypertension: Secondary | ICD-10-CM | POA: Diagnosis present

## 2024-07-22 DIAGNOSIS — E872 Acidosis, unspecified: Secondary | ICD-10-CM | POA: Diagnosis present

## 2024-07-22 DIAGNOSIS — G4733 Obstructive sleep apnea (adult) (pediatric): Secondary | ICD-10-CM | POA: Diagnosis present

## 2024-07-22 DIAGNOSIS — J449 Chronic obstructive pulmonary disease, unspecified: Secondary | ICD-10-CM

## 2024-07-22 DIAGNOSIS — N3289 Other specified disorders of bladder: Secondary | ICD-10-CM | POA: Diagnosis present

## 2024-07-22 DIAGNOSIS — R6520 Severe sepsis without septic shock: Secondary | ICD-10-CM | POA: Diagnosis present

## 2024-07-22 DIAGNOSIS — D709 Neutropenia, unspecified: Secondary | ICD-10-CM | POA: Diagnosis present

## 2024-07-22 DIAGNOSIS — Z881 Allergy status to other antibiotic agents status: Secondary | ICD-10-CM | POA: Diagnosis not present

## 2024-07-22 DIAGNOSIS — R7881 Bacteremia: Secondary | ICD-10-CM

## 2024-07-22 DIAGNOSIS — E785 Hyperlipidemia, unspecified: Secondary | ICD-10-CM

## 2024-07-22 DIAGNOSIS — N200 Calculus of kidney: Secondary | ICD-10-CM | POA: Diagnosis present

## 2024-07-22 DIAGNOSIS — M170 Bilateral primary osteoarthritis of knee: Secondary | ICD-10-CM | POA: Diagnosis present

## 2024-07-22 DIAGNOSIS — Z886 Allergy status to analgesic agent status: Secondary | ICD-10-CM

## 2024-07-22 DIAGNOSIS — Z91040 Latex allergy status: Secondary | ICD-10-CM

## 2024-07-22 DIAGNOSIS — Z9049 Acquired absence of other specified parts of digestive tract: Secondary | ICD-10-CM

## 2024-07-22 DIAGNOSIS — R112 Nausea with vomiting, unspecified: Secondary | ICD-10-CM | POA: Diagnosis present

## 2024-07-22 DIAGNOSIS — Z87891 Personal history of nicotine dependence: Secondary | ICD-10-CM

## 2024-07-22 DIAGNOSIS — Z79899 Other long term (current) drug therapy: Secondary | ICD-10-CM

## 2024-07-22 DIAGNOSIS — R7401 Elevation of levels of liver transaminase levels: Secondary | ICD-10-CM | POA: Diagnosis present

## 2024-07-22 DIAGNOSIS — K909 Intestinal malabsorption, unspecified: Secondary | ICD-10-CM | POA: Insufficient documentation

## 2024-07-22 DIAGNOSIS — Z880 Allergy status to penicillin: Secondary | ICD-10-CM

## 2024-07-22 DIAGNOSIS — K2990 Gastroduodenitis, unspecified, without bleeding: Secondary | ICD-10-CM | POA: Diagnosis present

## 2024-07-22 DIAGNOSIS — Z90710 Acquired absence of both cervix and uterus: Secondary | ICD-10-CM

## 2024-07-22 DIAGNOSIS — J4540 Moderate persistent asthma, uncomplicated: Secondary | ICD-10-CM | POA: Diagnosis present

## 2024-07-22 DIAGNOSIS — Z882 Allergy status to sulfonamides status: Secondary | ICD-10-CM

## 2024-07-22 DIAGNOSIS — K5904 Chronic idiopathic constipation: Secondary | ICD-10-CM | POA: Diagnosis present

## 2024-07-22 DIAGNOSIS — R5081 Fever presenting with conditions classified elsewhere: Secondary | ICD-10-CM | POA: Diagnosis present

## 2024-07-22 DIAGNOSIS — Z8261 Family history of arthritis: Secondary | ICD-10-CM

## 2024-07-22 HISTORY — PX: CYSTOSCOPY WITH STENT PLACEMENT: SHX5790

## 2024-07-22 LAB — DIFFERENTIAL
Basophils Absolute: 0 K/uL (ref 0.0–0.1)
Basophils Relative: 0 %
Eosinophils Absolute: 0 K/uL (ref 0.0–0.5)
Eosinophils Relative: 1 %
Lymphocytes Relative: 50 %
Lymphs Abs: 0.5 K/uL — ABNORMAL LOW (ref 0.7–4.0)
Monocytes Absolute: 0 K/uL — ABNORMAL LOW (ref 0.1–1.0)
Monocytes Relative: 0 %
Neutro Abs: 0.4 K/uL — CL (ref 1.7–7.7)
Neutrophils Relative %: 49 %
Smear Review: NORMAL

## 2024-07-22 LAB — URINALYSIS, ROUTINE W REFLEX MICROSCOPIC
Bilirubin Urine: NEGATIVE
Glucose, UA: NEGATIVE mg/dL
Ketones, ur: NEGATIVE mg/dL
Nitrite: POSITIVE — AB
Protein, ur: 30 mg/dL — AB
Specific Gravity, Urine: 1.013 (ref 1.005–1.030)
pH: 7 (ref 5.0–8.0)

## 2024-07-22 LAB — CBC
HCT: 45.4 % (ref 36.0–46.0)
Hemoglobin: 15.2 g/dL — ABNORMAL HIGH (ref 12.0–15.0)
MCH: 32.2 pg (ref 26.0–34.0)
MCHC: 33.5 g/dL (ref 30.0–36.0)
MCV: 96.2 fL (ref 80.0–100.0)
Platelets: 172 K/uL (ref 150–400)
RBC: 4.72 MIL/uL (ref 3.87–5.11)
RDW: 12.9 % (ref 11.5–15.5)
WBC: 0.9 K/uL — CL (ref 4.0–10.5)
nRBC: 0 % (ref 0.0–0.2)

## 2024-07-22 LAB — BLOOD GAS, VENOUS
Acid-base deficit: 10.7 mmol/L — ABNORMAL HIGH (ref 0.0–2.0)
Bicarbonate: 15.9 mmol/L — ABNORMAL LOW (ref 20.0–28.0)
Drawn by: 4237
O2 Saturation: 39.7 %
Patient temperature: 37.7
pCO2, Ven: 38 mmHg — ABNORMAL LOW (ref 44–60)
pH, Ven: 7.23 — ABNORMAL LOW (ref 7.25–7.43)
pO2, Ven: 31 mmHg — CL (ref 32–45)

## 2024-07-22 LAB — PHOSPHORUS: Phosphorus: 2.4 mg/dL — ABNORMAL LOW (ref 2.5–4.6)

## 2024-07-22 LAB — BASIC METABOLIC PANEL WITH GFR
Anion gap: 18 — ABNORMAL HIGH (ref 5–15)
BUN: 7 mg/dL — ABNORMAL LOW (ref 8–23)
CO2: 15 mmol/L — ABNORMAL LOW (ref 22–32)
Calcium: 9.4 mg/dL (ref 8.9–10.3)
Chloride: 107 mmol/L (ref 98–111)
Creatinine, Ser: 0.8 mg/dL (ref 0.44–1.00)
GFR, Estimated: >60 mL/min (ref >60)
Glucose, Bld: 145 mg/dL — ABNORMAL HIGH (ref 70–99)
Potassium: 3 mmol/L — ABNORMAL LOW (ref 3.5–5.1)
Sodium: 140 mmol/L (ref 135–145)

## 2024-07-22 LAB — HEPATIC FUNCTION PANEL
ALT: 46 U/L — ABNORMAL HIGH (ref 0–44)
AST: 36 U/L (ref 15–41)
Albumin: 4.4 g/dL (ref 3.5–5.0)
Alkaline Phosphatase: 138 U/L — ABNORMAL HIGH (ref 38–126)
Bilirubin, Direct: 0.5 mg/dL — ABNORMAL HIGH (ref 0.0–0.2)
Indirect Bilirubin: 0.4 mg/dL (ref 0.3–0.9)
Total Bilirubin: 0.9 mg/dL (ref 0.0–1.2)
Total Protein: 7.3 g/dL (ref 6.5–8.1)

## 2024-07-22 LAB — LACTIC ACID, PLASMA
Lactic Acid, Venous: 5.8 mmol/L (ref 0.5–1.9)
Lactic Acid, Venous: 5.8 mmol/L (ref 0.5–1.9)
Lactic Acid, Venous: 5.8 mmol/L (ref 0.5–1.9)

## 2024-07-22 LAB — MAGNESIUM: Magnesium: 1.5 mg/dL — ABNORMAL LOW (ref 1.7–2.4)

## 2024-07-22 LAB — GLUCOSE, CAPILLARY: Glucose-Capillary: 110 mg/dL — ABNORMAL HIGH (ref 70–99)

## 2024-07-22 SURGERY — CYSTOSCOPY, WITH STENT INSERTION
Anesthesia: General | Laterality: Left

## 2024-07-22 MED ORDER — DIPHENHYDRAMINE HCL 25 MG PO CAPS
25.0000 mg | ORAL_CAPSULE | Freq: Four times a day (QID) | ORAL | Status: DC | PRN
  Administered 2024-07-22: 25 mg via ORAL
  Filled 2024-07-22 (×2): qty 1

## 2024-07-22 MED ORDER — SODIUM CHLORIDE 0.9 % IV SOLN
2.0000 g | Freq: Once | INTRAVENOUS | Status: AC
  Administered 2024-07-22: 2 g via INTRAVENOUS
  Filled 2024-07-22: qty 20

## 2024-07-22 MED ORDER — SODIUM CHLORIDE 0.9 % IV SOLN: INTRAVENOUS | Status: AC

## 2024-07-22 MED ORDER — LACTATED RINGERS IV BOLUS
1000.0000 mL | Freq: Once | INTRAVENOUS | Status: AC
  Administered 2024-07-22: 1000 mL via INTRAVENOUS

## 2024-07-22 MED ORDER — KETOROLAC TROMETHAMINE 30 MG/ML IJ SOLN
15.0000 mg | Freq: Once | INTRAMUSCULAR | Status: AC
  Administered 2024-07-22: 15 mg via INTRAVENOUS
  Filled 2024-07-22: qty 1

## 2024-07-22 MED ORDER — MIDAZOLAM HCL 2 MG/2ML IJ SOLN
INTRAMUSCULAR | Status: DC | PRN
  Administered 2024-07-22: 2 mg via INTRAVENOUS

## 2024-07-22 MED ORDER — DIPHENHYDRAMINE HCL 50 MG/ML IJ SOLN
INTRAMUSCULAR | Status: AC
  Filled 2024-07-22: qty 1

## 2024-07-22 MED ORDER — ACETAMINOPHEN 325 MG PO TABS
650.0000 mg | ORAL_TABLET | Freq: Four times a day (QID) | ORAL | Status: DC | PRN
  Administered 2024-07-23 – 2024-07-26 (×7): 650 mg via ORAL
  Filled 2024-07-22 (×8): qty 2

## 2024-07-22 MED ORDER — HYDROMORPHONE HCL 1 MG/ML IJ SOLN
1.0000 mg | Freq: Once | INTRAMUSCULAR | Status: AC
  Administered 2024-07-22: 1 mg via INTRAVENOUS
  Filled 2024-07-22: qty 1

## 2024-07-22 MED ORDER — TOPIRAMATE 25 MG PO TABS: 50.0000 mg | ORAL_TABLET | Freq: Two times a day (BID) | ORAL | Status: DC

## 2024-07-22 MED ORDER — HYOSCYAMINE SULFATE 0.125 MG SL SUBL
0.1250 mg | SUBLINGUAL_TABLET | Freq: Four times a day (QID) | SUBLINGUAL | Status: DC | PRN
  Administered 2024-07-23: 0.125 mg via SUBLINGUAL
  Filled 2024-07-22 (×2): qty 1

## 2024-07-22 MED ORDER — LIDOCAINE 5 % EX PTCH
1.0000 | MEDICATED_PATCH | CUTANEOUS | Status: AC
  Administered 2024-07-22: 1 via TRANSDERMAL
  Filled 2024-07-22: qty 1

## 2024-07-22 MED ORDER — SODIUM CHLORIDE 0.9 % IV BOLUS
1000.0000 mL | Freq: Once | INTRAVENOUS | Status: AC
  Administered 2024-07-22: 1000 mL via INTRAVENOUS

## 2024-07-22 MED ORDER — OXYCODONE HCL 5 MG PO TABS: 5.0000 mg | ORAL_TABLET | Freq: Once | ORAL | Status: DC | PRN

## 2024-07-22 MED ORDER — CHLORHEXIDINE GLUCONATE 0.12 % MT SOLN
15.0000 mL | Freq: Once | OROMUCOSAL | Status: AC
  Administered 2024-07-22: 15 mL via OROMUCOSAL

## 2024-07-22 MED ORDER — SACROSIDASE 8500 UNIT/ML PO SOLN
2.0000 mL | Freq: Three times a day (TID) | ORAL | Status: DC
  Administered 2024-07-24 – 2024-07-26 (×6): 17000 [IU] via ORAL
  Filled 2024-07-22 (×8): qty 2

## 2024-07-22 MED ORDER — PHENYLEPHRINE HCL (PRESSORS) 10 MG/ML IV SOLN
INTRAVENOUS | Status: DC | PRN
Start: 2024-07-22 — End: 2024-07-22
  Administered 2024-07-22 (×3): 160 ug via INTRAVENOUS
  Administered 2024-07-22: 80 ug via INTRAVENOUS
  Administered 2024-07-22: 160 ug via INTRAVENOUS

## 2024-07-22 MED ORDER — ACETAMINOPHEN 10 MG/ML IV SOLN: 500.0000 mg | Freq: Once | INTRAVENOUS | Status: DC

## 2024-07-22 MED ORDER — ALPRAZOLAM 0.5 MG PO TABS
0.5000 mg | ORAL_TABLET | Freq: Every day | ORAL | Status: DC
  Administered 2024-07-23 – 2024-07-25 (×3): 0.5 mg via ORAL
  Filled 2024-07-22 (×3): qty 1

## 2024-07-22 MED ORDER — ACETAMINOPHEN 500 MG PO TABS
1000.0000 mg | ORAL_TABLET | Freq: Once | ORAL | Status: DC
  Filled 2024-07-22: qty 2

## 2024-07-22 MED ORDER — FENTANYL CITRATE (PF) 100 MCG/2ML IJ SOLN
INTRAMUSCULAR | Status: DC | PRN
  Administered 2024-07-22 (×2): 50 ug via INTRAVENOUS

## 2024-07-22 MED ORDER — ONDANSETRON HCL 4 MG/2ML IJ SOLN
INTRAMUSCULAR | Status: DC | PRN
  Administered 2024-07-22: 4 mg via INTRAVENOUS

## 2024-07-22 MED ORDER — IOHEXOL 300 MG/ML  SOLN
INTRAMUSCULAR | Status: DC | PRN
  Administered 2024-07-22: 10 mL

## 2024-07-22 MED ORDER — POTASSIUM CHLORIDE CRYS ER 20 MEQ PO TBCR
40.0000 meq | EXTENDED_RELEASE_TABLET | Freq: Once | ORAL | Status: AC
Start: 2024-07-22 — End: 2024-07-22
  Administered 2024-07-22: 40 meq via ORAL
  Filled 2024-07-22: qty 2

## 2024-07-22 MED ORDER — CHLORHEXIDINE GLUCONATE CLOTH 2 % EX PADS
6.0000 | MEDICATED_PAD | Freq: Every day | CUTANEOUS | Status: DC
Start: 2024-07-22 — End: 2024-07-26
  Administered 2024-07-22 – 2024-07-26 (×5): 6 via TOPICAL

## 2024-07-22 MED ORDER — PRAVASTATIN SODIUM 40 MG PO TABS
40.0000 mg | ORAL_TABLET | Freq: Every day | ORAL | Status: DC
  Administered 2024-07-22 – 2024-07-25 (×4): 40 mg via ORAL
  Filled 2024-07-22 (×3): qty 2
  Filled 2024-07-22: qty 1

## 2024-07-22 MED ORDER — DIPHENHYDRAMINE HCL 50 MG/ML IJ SOLN
25.0000 mg | Freq: Once | INTRAMUSCULAR | Status: AC
  Administered 2024-07-22: 25 mg via INTRAVENOUS
  Filled 2024-07-22: qty 1

## 2024-07-22 MED ORDER — NITROGLYCERIN 0.4 MG SL SUBL: 0.4000 mg | SUBLINGUAL_TABLET | SUBLINGUAL | Status: DC | PRN

## 2024-07-22 MED ORDER — AMISULPRIDE (ANTIEMETIC) 5 MG/2ML IV SOLN: 10.0000 mg | Freq: Once | INTRAVENOUS | Status: DC | PRN

## 2024-07-22 MED ORDER — PHENYLEPHRINE 80 MCG/ML (10ML) SYRINGE FOR IV PUSH (FOR BLOOD PRESSURE SUPPORT)
PREFILLED_SYRINGE | INTRAVENOUS | Status: AC
  Filled 2024-07-22: qty 10

## 2024-07-22 MED ORDER — DIPHENHYDRAMINE HCL 50 MG/ML IJ SOLN
12.5000 mg | Freq: Once | INTRAMUSCULAR | Status: AC
  Administered 2024-07-22: 12.5 mg via INTRAVENOUS

## 2024-07-22 MED ORDER — TOPIRAMATE 100 MG PO TABS
100.0000 mg | ORAL_TABLET | Freq: Every day | ORAL | Status: DC
  Administered 2024-07-22 – 2024-07-25 (×4): 100 mg via ORAL
  Filled 2024-07-22 (×4): qty 1

## 2024-07-22 MED ORDER — PHENYLEPHRINE HCL-NACL 20-0.9 MG/250ML-% IV SOLN
INTRAVENOUS | Status: DC | PRN
  Administered 2024-07-22: 40 ug/min via INTRAVENOUS

## 2024-07-22 MED ORDER — HYDROMORPHONE HCL 1 MG/ML IJ SOLN
0.2500 mg | INTRAMUSCULAR | Status: DC | PRN
  Administered 2024-07-22 (×4): 0.5 mg via INTRAVENOUS

## 2024-07-22 MED ORDER — OXYCODONE HCL 5 MG/5ML PO SOLN: 5.0000 mg | Freq: Once | ORAL | Status: DC | PRN

## 2024-07-22 MED ORDER — SODIUM CHLORIDE 0.9 % IV SOLN: 1.0000 g | INTRAVENOUS | Status: DC

## 2024-07-22 MED ORDER — ACETAMINOPHEN 650 MG RE SUPP: 650.0000 mg | Freq: Four times a day (QID) | RECTAL | Status: DC | PRN

## 2024-07-22 MED ORDER — LIDOCAINE HCL (PF) 2 % IJ SOLN
INTRAMUSCULAR | Status: DC | PRN
  Administered 2024-07-22: 100 mg via INTRADERMAL

## 2024-07-22 MED ORDER — LORATADINE 10 MG PO TABS
10.0000 mg | ORAL_TABLET | Freq: Every day | ORAL | Status: DC
Start: 2024-07-22 — End: 2024-07-26
  Administered 2024-07-22 – 2024-07-25 (×4): 10 mg via ORAL
  Filled 2024-07-22 (×4): qty 1

## 2024-07-22 MED ORDER — HYDROMORPHONE HCL 1 MG/ML IJ SOLN
INTRAMUSCULAR | Status: AC
  Filled 2024-07-22: qty 2

## 2024-07-22 MED ORDER — PROPOFOL 10 MG/ML IV BOLUS
INTRAVENOUS | Status: AC
  Filled 2024-07-22: qty 20

## 2024-07-22 MED ORDER — INSULIN ASPART 100 UNIT/ML IJ SOLN: 0.0000 [IU] | INTRAMUSCULAR | Status: DC | PRN

## 2024-07-22 MED ORDER — ASPIRIN 81 MG PO TBEC
81.0000 mg | DELAYED_RELEASE_TABLET | Freq: Every day | ORAL | Status: DC
  Administered 2024-07-22 – 2024-07-25 (×4): 81 mg via ORAL
  Filled 2024-07-22 (×4): qty 1

## 2024-07-22 MED ORDER — TIZANIDINE HCL 4 MG PO TABS
4.0000 mg | ORAL_TABLET | Freq: Four times a day (QID) | ORAL | Status: DC | PRN
  Administered 2024-07-23 – 2024-07-25 (×7): 4 mg via ORAL
  Filled 2024-07-22 (×7): qty 1

## 2024-07-22 MED ORDER — ACETAMINOPHEN 650 MG RE SUPP: 650.0000 mg | Freq: Once | RECTAL | Status: AC

## 2024-07-22 MED ORDER — STERILE WATER FOR IRRIGATION IR SOLN
Status: DC | PRN
  Administered 2024-07-22: 500 mL

## 2024-07-22 MED ORDER — ONDANSETRON HCL 4 MG/2ML IJ SOLN
INTRAMUSCULAR | Status: AC
  Filled 2024-07-22: qty 2

## 2024-07-22 MED ORDER — LUBIPROSTONE 24 MCG PO CAPS
24.0000 ug | ORAL_CAPSULE | Freq: Two times a day (BID) | ORAL | Status: DC
  Administered 2024-07-22 – 2024-07-26 (×3): 24 ug via ORAL
  Filled 2024-07-22 (×9): qty 1

## 2024-07-22 MED ORDER — DIPHENHYDRAMINE HCL 50 MG/ML IJ SOLN
12.5000 mg | Freq: Once | INTRAMUSCULAR | Status: AC
  Administered 2024-07-22: 12.5 mg via INTRAVENOUS
  Filled 2024-07-22: qty 1

## 2024-07-22 MED ORDER — POTASSIUM CHLORIDE 10 MEQ/100ML IV SOLN
10.0000 meq | Freq: Once | INTRAVENOUS | Status: AC
  Administered 2024-07-22: 10 meq via INTRAVENOUS
  Filled 2024-07-22: qty 100

## 2024-07-22 MED ORDER — METOCLOPRAMIDE HCL 5 MG/ML IJ SOLN
10.0000 mg | Freq: Once | INTRAMUSCULAR | Status: AC
  Administered 2024-07-22: 10 mg via INTRAVENOUS
  Filled 2024-07-22: qty 2

## 2024-07-22 MED ORDER — LACTATED RINGERS IV SOLN: INTRAVENOUS | Status: DC

## 2024-07-22 MED ORDER — TOPIRAMATE 25 MG PO TABS
50.0000 mg | ORAL_TABLET | Freq: Every day | ORAL | Status: DC
  Administered 2024-07-22 – 2024-07-26 (×5): 50 mg via ORAL
  Filled 2024-07-22 (×5): qty 2

## 2024-07-22 MED ORDER — ACETAMINOPHEN 650 MG RE SUPP
RECTAL | Status: AC
  Administered 2024-07-22: 650 mg via RECTAL
  Filled 2024-07-22: qty 1

## 2024-07-22 MED ORDER — URSODIOL 300 MG PO CAPS
300.0000 mg | ORAL_CAPSULE | Freq: Three times a day (TID) | ORAL | Status: DC
  Administered 2024-07-22 – 2024-07-26 (×12): 300 mg via ORAL
  Filled 2024-07-22 (×15): qty 1

## 2024-07-22 MED ORDER — POTASSIUM CHLORIDE 10 MEQ/100ML IV SOLN
10.0000 meq | INTRAVENOUS | Status: AC
  Administered 2024-07-22 – 2024-07-23 (×4): 10 meq via INTRAVENOUS
  Filled 2024-07-22 (×4): qty 100

## 2024-07-22 MED ORDER — PANTOPRAZOLE SODIUM 40 MG PO TBEC
40.0000 mg | DELAYED_RELEASE_TABLET | Freq: Every day | ORAL | Status: DC
  Administered 2024-07-23 – 2024-07-26 (×4): 40 mg via ORAL
  Filled 2024-07-22 (×4): qty 1

## 2024-07-22 MED ORDER — MAGNESIUM SULFATE 2 GM/50ML IV SOLN
2.0000 g | Freq: Once | INTRAVENOUS | Status: AC
  Administered 2024-07-22: 2 g via INTRAVENOUS
  Filled 2024-07-22: qty 50

## 2024-07-22 MED ORDER — MIDAZOLAM HCL 2 MG/2ML IJ SOLN
INTRAMUSCULAR | Status: AC
  Filled 2024-07-22: qty 2

## 2024-07-22 MED ORDER — IPRATROPIUM BROMIDE 0.03 % NA SOLN
2.0000 | Freq: Two times a day (BID) | NASAL | Status: DC
  Administered 2024-07-22 – 2024-07-26 (×5): 2 via NASAL
  Filled 2024-07-22: qty 30

## 2024-07-22 MED ORDER — PROPOFOL 10 MG/ML IV BOLUS
INTRAVENOUS | Status: DC | PRN
  Administered 2024-07-22: 80 mg via INTRAVENOUS

## 2024-07-22 MED ORDER — LIDOCAINE HCL (PF) 2 % IJ SOLN
INTRAMUSCULAR | Status: AC
  Filled 2024-07-22: qty 5

## 2024-07-22 MED ORDER — SODIUM CHLORIDE 0.9 % IV SOLN
2.0000 g | Freq: Three times a day (TID) | INTRAVENOUS | Status: DC
  Administered 2024-07-22 – 2024-07-23 (×2): 2 g via INTRAVENOUS
  Filled 2024-07-22 (×3): qty 12.5

## 2024-07-22 MED ORDER — KETOROLAC TROMETHAMINE 15 MG/ML IJ SOLN
15.0000 mg | Freq: Once | INTRAMUSCULAR | Status: AC
  Administered 2024-07-22: 15 mg via INTRAVENOUS
  Filled 2024-07-22: qty 1

## 2024-07-22 MED ORDER — FENTANYL CITRATE (PF) 100 MCG/2ML IJ SOLN
INTRAMUSCULAR | Status: AC
  Filled 2024-07-22: qty 2

## 2024-07-22 MED ORDER — HYDROXYZINE HCL 10 MG PO TABS
10.0000 mg | ORAL_TABLET | Freq: Every evening | ORAL | Status: DC | PRN
  Administered 2024-07-22: 10 mg via ORAL
  Filled 2024-07-22: qty 1

## 2024-07-22 SURGICAL SUPPLY — 13 items
BAG URINE DRAIN 2000ML AR STRL (UROLOGICAL SUPPLIES) IMPLANT
BAG URO CATCHER STRL LF (MISCELLANEOUS) ×1 IMPLANT
CATH FOLEY 2WAY SLVR 5CC 16FR (CATHETERS) IMPLANT
CATH URETL OPEN END 6FR 70 (CATHETERS) ×1 IMPLANT
CLOTH BEACON ORANGE TIMEOUT ST (SAFETY) ×1 IMPLANT
GLOVE BIO SURGEON STRL SZ7.5 (GLOVE) ×1 IMPLANT
GOWN STRL REUS W/ TWL XL LVL3 (GOWN DISPOSABLE) ×1 IMPLANT
GUIDEWIRE STR DUAL SENSOR (WIRE) ×1 IMPLANT
KIT TURNOVER KIT A (KITS) ×1 IMPLANT
MANIFOLD NEPTUNE II (INSTRUMENTS) ×1 IMPLANT
PACK CYSTO (CUSTOM PROCEDURE TRAY) ×1 IMPLANT
STENT URET 6FRX24 CONTOUR (STENTS) IMPLANT
TUBING CONNECTING 10 (TUBING) ×1 IMPLANT

## 2024-07-22 NOTE — ED Provider Notes (Signed)
 [facility] EMERGENCY DEPARTMENT AT [facility] Provider Note   CSN: [id number] Arrival date & time: 07/22/24  9149     Patient presents with: Flank Pain   [name] is a 63 y.o. female.   Patient has a history of COPD seizures rheumatoid arthritis and kidney stones.  Patient started today with left flank pain and vomiting.  No fever.  The history is provided by the patient and medical records. No language interpreter was used.  Flank Pain This is a new problem. The current episode started 3 to 5 hours ago. The problem occurs constantly. The problem has not changed since onset.Pertinent negatives include no chest pain, no abdominal pain and no headaches. Nothing aggravates the symptoms. Nothing relieves the symptoms. She has tried nothing for the symptoms.       Prior to Admission medications   Medication Sig Start Date End Date Taking? Authorizing Provider  albuterol  (VENTOLIN  HFA) 108 (90 Base) MCG/ACT inhaler Inhale 1-2 puffs into the lungs every 6 (six) hours as needed for wheezing or shortness of breath.   Yes [provider]  ALPRAZolam  (XANAX ) 0.5 MG tablet Take 0.5 mg by mouth at bedtime. 06/10/24  Yes [provider]  amLODipine  (NORVASC ) 5 MG tablet Take 1 tablet (5 mg total) by mouth daily. 04/14/24  Yes Emelia Josefa HERO, NP  aspirin  EC 81 MG tablet Take 81 mg by mouth at bedtime.   Yes [provider]  dicyclomine  (BENTYL ) 10 MG capsule Take 1 capsule (10 mg total) by mouth 4 (four) times daily -  before meals and at bedtime. Patient taking differently: Take 10 mg by mouth 4 (four) times daily. 02/29/24 07/22/24 Yes Shah, Pratik D, DO  hydrocortisone  (ANUSOL -HC) 25 MG suppository Place 1 suppository (25 mg total) rectally 2 (two) times daily for 10 days. 07/15/24 07/25/24 Yes Ahmed, Deatrice FALCON, MD  hydrOXYzine  (ATARAX ) 10 MG tablet Take 1 tablet (10 mg total) by mouth at bedtime as needed and may repeat dose one time if needed. 07/07/24  08/06/24 Yes Ahmed, Deatrice FALCON, MD  hyoscyamine  (LEVSIN  SL) 0.125 MG SL tablet Place 1 tablet (0.125 mg total) under the tongue every 6 (six) hours as needed. 03/13/24  Yes Carlan, Chelsea L, NP  ipratropium (ATROVENT ) 0.03 % nasal spray Place 2 sprays into the nose in the morning and at bedtime. 05/18/21  Yes [provider]  loratadine  (CLARITIN ) 10 MG tablet Take 10 mg by mouth at bedtime.   Yes [provider]  lubiprostone  (AMITIZA ) 24 MCG capsule Take 1 capsule (24 mcg total) by mouth 2 (two) times daily with a meal. 06/04/24 12/01/24 Yes Ahmed, Deatrice FALCON, MD  nitroGLYCERIN  (NITROSTAT ) 0.4 MG SL tablet Place 1 tablet (0.4 mg total) under the tongue every 5 (five) minutes as needed for chest pain. 03/13/23 07/22/24 Yes Ladona Heinz, MD  OVER THE COUNTER MEDICATION Bio True eye drops daily.   Yes [provider]  pantoprazole  (PROTONIX ) 40 MG tablet TAKE 1 TABLET BY MOUTH DAILY 02/06/24  Yes Ahmed, Muhammad F, MD  pravastatin  (PRAVACHOL ) 40 MG tablet Take 40 mg by mouth daily.   Yes [provider]  PRESCRIPTION MEDICATION In Cholesterol study. Has injection once a month.has been in study for 3 years as of 12/07/23 and has 6 months to left in study. ( Will finish around end of August 2025 )   Yes [provider]  rizatriptan (MAXALT) 5 MG tablet Take 5 mg by mouth as needed for migraine. (  not more than 3 a week). May repeat in 2 hours if needed 12/06/23 12/05/24 Yes [provider]  Sacrosidase  (SUCRAID ) 8500 UNIT/ML SOLN Take 2 mLs (17,000 Units total) by mouth with breakfast, with lunch, and with evening meal. Administer half the dose at the beginning of th meal or snack and other half during the meals Patient taking differently: Take 2 mLs by mouth with breakfast, with lunch, and with evening meal. Administer half the dose at the beginning of th meal or snack and other half during the meals 05/12/24 08/10/24 Yes Ahmed, Deatrice FALCON, MD  tiZANidine (ZANAFLEX)  4 MG tablet Take 1 tablet by mouth every 6 (six) hours as needed for muscle spasms. 10/05/23  Yes [provider]  topiramate  (TOPAMAX ) 50 MG tablet Take 50-100 mg by mouth 2 (two) times daily. 50 mg am and 100 mg pm 10/19/22  Yes [provider]  ursodiol  (ACTIGALL ) 300 MG capsule Take 1 capsule (300 mg total) by mouth 3 (three) times daily. 04/23/24  Yes Ahmed, Deatrice FALCON, MD    Allergies: Aquacel [carboxymethylcellulose], Dextromethorphan, Nickel, Sulfonamide derivatives, Aspirin , Ciprofloxacin, Cosyntropin, Amoxicillin , Diclofenac sodium, Latex, Moxifloxacin, Nsaids, and Tape    Review of Systems  Constitutional:  Negative for appetite change and fatigue.  HENT:  Negative for congestion, ear discharge and sinus pressure.   Eyes:  Negative for discharge.  Respiratory:  Negative for cough.   Cardiovascular:  Negative for chest pain.  Gastrointestinal:  Negative for abdominal pain and diarrhea.  Genitourinary:  Positive for flank pain. Negative for frequency and hematuria.  Musculoskeletal:  Negative for back pain.  Skin:  Negative for rash.  Neurological:  Negative for seizures and headaches.  Psychiatric/Behavioral:  Negative for hallucinations.     Updated Vital Signs BP 133/78   Pulse (!) 116   Temp 99.9 F (37.7 C) (Oral)   Resp 18   Ht 5' 4 (1.626 m)   Wt 66.5 kg   SpO2 98%   BMI 25.16 kg/m   Physical Exam Vitals and nursing note reviewed.  Constitutional:      Appearance: She is well-developed.  HENT:     Head: Normocephalic.     Nose: Nose normal.  Eyes:     General: No scleral icterus.    Conjunctiva/sclera: Conjunctivae normal.  Neck:     Thyroid : No thyromegaly.  Cardiovascular:     Rate and Rhythm: Normal rate and regular rhythm.     Heart sounds: No murmur heard.    No friction rub. No gallop.  Pulmonary:     Breath sounds: No stridor. No wheezing or rales.  Chest:     Chest wall: No tenderness.  Abdominal:     General: There is no  distension.     Tenderness: There is no abdominal tenderness. There is no rebound.  Musculoskeletal:        General: Normal range of motion.     Cervical back: Neck supple.     Comments: Tender left flank  Lymphadenopathy:     Cervical: No cervical adenopathy.  Skin:    Findings: No erythema or rash.  Neurological:     Mental Status: She is alert and oriented to person, place, and time.     Motor: No abnormal muscle tone.     Coordination: Coordination normal.  Psychiatric:        Behavior: Behavior normal.     (all labs ordered are listed, but only abnormal results are displayed) Labs Reviewed  URINALYSIS, ROUTINE  W REFLEX MICROSCOPIC - Abnormal; Notable for the following components:      Result Value   APPearance CLOUDY (*)    Hgb urine dipstick MODERATE (*)    Protein, ur 30 (*)    Nitrite POSITIVE (*)    Leukocytes,Ua TRACE (*)    Bacteria, UA RARE (*)    All other components within normal limits  BASIC METABOLIC PANEL WITH GFR - Abnormal; Notable for the following components:   Potassium 3.0 (*)    CO2 15 (*)    Glucose, Bld 145 (*)    BUN 7 (*)    Anion gap 18 (*)    All other components within normal limits  CBC - Abnormal; Notable for the following components:   WBC 0.9 (*)    Hemoglobin 15.2 (*)    All other components within normal limits  HEPATIC FUNCTION PANEL - Abnormal; Notable for the following components:   ALT 46 (*)    Alkaline Phosphatase 138 (*)    Bilirubin, Direct 0.5 (*)    All other components within normal limits  BLOOD GAS, VENOUS - Abnormal; Notable for the following components:   pH, Ven 7.23 (*)    pCO2, Ven 38 (*)    pO2, Ven 31 (*)    Bicarbonate 15.9 (*)    Acid-base deficit 10.7 (*)    All other components within normal limits  DIFFERENTIAL - Abnormal; Notable for the following components:   Neutro Abs 0.4 (*)    Lymphs Abs 0.5 (*)    Monocytes Absolute 0.0 (*)    All other components within normal limits  CULTURE, BLOOD  (ROUTINE X 2)  CULTURE, BLOOD (ROUTINE X 2)  LACTIC ACID, PLASMA  LACTIC ACID, PLASMA    EKG: None  Radiology: CT Renal Stone Study Result Date: 07/22/2024 CLINICAL DATA:  Left flank pain. EXAM: CT ABDOMEN AND PELVIS WITHOUT CONTRAST TECHNIQUE: Multidetector CT imaging of the abdomen and pelvis was performed following the standard protocol without IV contrast. RADIATION DOSE REDUCTION: This exam was performed according to the departmental dose-optimization program which includes automated exposure control, adjustment of the mA and/or kV according to patient size and/or use of iterative reconstruction technique. COMPARISON:  CTA abdomen pelvis dated 03/14/2024. FINDINGS: Lower chest: Similar linear scarring at the right lung base. Hepatobiliary: No suspicious focal hepatic lesion identified within the limits of an unenhanced exam. Status post cholecystectomy. No biliary dilatation. Pancreas: Unremarkable. No pancreatic ductal dilatation or surrounding inflammatory changes. Spleen: Normal in size without focal abnormality. Adrenals/Urinary Tract: 4 mm calculus in the left proximal ureter near the ureteropelvic junction with mild-to-moderate left hydronephrosis and perinephric stranding. 5 mm nonobstructive calculus at the inferior pole of the left kidney. No right-sided urolithiasis or hydronephrosis. Bladder is mildly distended and otherwise unremarkable. Adrenal glands are unremarkable. Stomach/Bowel: Stomach is within normal limits. Status post appendectomy. No obstruction. The colon is decompressed with wall thickening which may be secondary to underdistention or a mild nonspecific colitis. Sigmoid colonic diverticulosis without evidence of acute diverticulitis. Vascular/Lymphatic: Abdominal aorta is normal in caliber with atherosclerotic calcification. No enlarged abdominal or pelvic lymph nodes. Reproductive: Status post hysterectomy. No adnexal masses. Other: Surgical mesh repair of the lower  anterior abdominal wall hernia. No evidence of recurrent hernia. No abdominopelvic ascites. No intraperitoneal free air. Musculoskeletal: No acute osseous abnormality. No suspicious osseous lesion. IMPRESSION: 1. 4 mm calculus in the left proximal ureter near the ureteropelvic junction with mild-to-moderate left hydronephrosis and perinephric stranding. 2. Nonobstructive 5 mm  calculus at the inferior pole of the left kidney. 3. The colon is decompressed with wall thickening which may be secondary to underdistention or a mild nonspecific colitis. 4. Sigmoid colonic diverticulosis without evidence of acute diverticulitis. 5.  Aortic Atherosclerosis (ICD10-I70.0). Electronically Signed   By: Harrietta Sherry M.D.   On: 07/22/2024 10:47     Procedures   Medications Ordered in the ED  cefTRIAXone (ROCEPHIN) 2 g in sodium chloride  0.9 % 100 mL IVPB (2 g Intravenous New Bag/Given 07/22/24 1126)  potassium chloride  10 mEq in 100 mL IVPB (0 mEq Intravenous Hold 07/22/24 1134)  ketorolac (TORADOL) 30 MG/ML injection 15 mg (15 mg Intravenous Given 07/22/24 0941)  sodium chloride  0.9 % bolus 1,000 mL (0 mLs Intravenous Stopped 07/22/24 1133)  HYDROmorphone  (DILAUDID ) injection 1 mg (1 mg Intravenous Given 07/22/24 0942)  metoCLOPramide  (REGLAN ) injection 10 mg (10 mg Intravenous Given 07/22/24 0941)  diphenhydrAMINE  (BENADRYL ) injection 25 mg (25 mg Intravenous Given 07/22/24 0942)  sodium chloride  0.9 % bolus 1,000 mL (1,000 mLs Intravenous Bolus from Bag 07/22/24 1133)   CRITICAL CARE Performed by: Fairy Sermon Total critical care time: 50 minutes Critical care time was exclusive of separately billable procedures and treating other patients. Critical care was necessary to treat or prevent imminent or life-threatening deterioration. Critical care was time spent personally by me on the following activities: development of treatment plan with patient and/or surrogate as well as nursing, discussions with  consultants, evaluation of patient's response to treatment, examination of patient, obtaining history from patient or surrogate, ordering and performing treatments and interventions, ordering and review of laboratory studies, ordering and review of radiographic studies, pulse oximetry and re-evaluation of patient's condition.    Patient is septic from infected kidney stone.  She is neutropenic with absolute neutrophil count of 400.  Patient has been started on antibiotics and fluids.  I spoke with Dr. Carolee of urology and he wanted the patient transferred to [facility] emergency department and then he will place a stent in the patient.  The patient will need a medicine admit.  I contacted Dr. Koren Dawn about the patient going to the ED                                 Medical Decision Making Amount and/or Complexity of Data Reviewed Labs: ordered. Radiology: ordered.  Risk Prescription drug management.   Sepsis from infected kidney stone.  Patient will be admitted to medicine at [facility] with urology placing a stent     Final diagnoses:  Kidney stone    ED Discharge Orders     None          Sermon Fairy, MD 07/22/24 1154

## 2024-07-22 NOTE — Progress Notes (Signed)
   07/22/24 2124  BiPAP/CPAP/SIPAP  BiPAP/CPAP/SIPAP Pt Type Adult  Reason BIPAP/CPAP not in use Non-compliant (patient refuses to go on CPAP tonight and states that she does not need it. I advised patient to let me know if she changes her mind and decides to wear it.)  BiPAP/CPAP /SiPAP Vitals  Pulse Rate (!) 104  Resp (!) 21  SpO2 91 %  MEWS Score/Color  MEWS Score 3  MEWS Score Color Yellow

## 2024-07-22 NOTE — Discharge Instructions (Signed)
 Transferred to [facility] emergency department.  Dr. Dasie is accepting

## 2024-07-22 NOTE — H&P (Addendum)
 History and Physical    Patient: [name] FMW:[id number] DOB: 1961-09-04 DOA: 07/22/2024 DOS: the patient was seen and examined on 07/22/2024 PCP: Debrah Josette ORN., PA-C  Patient coming from: Home  Chief Complaint:  Chief Complaint  Patient presents with   Flank Pain   HPI: [name] is a 63 y.o. female with medical history significant of allergic rhinitis, anxiety, depression, asthma/COPD, cerebral vascular malformation, history of CVA, diverticulosis/diverticulitis, eczema, fibromyalgia, GERD, migraine headaches, hyperlipidemia, IBS, insomnia, lactose intolerance, nausea patent foramen ovale, plaque psoriasis, postop anemia due to acute blood loss osteoarthritis, rheumatoid arthritis, history of seizures, sleep apnea on CPAP, thyroid  cyst, nephrolithiasis who presented to the emergency department with complaints of left flank pain associated with fever, nausea, multiple episodes of emesis and chills.  No diarrhea, melena or hematochezia.  She has a history of constipation.  No dysuria, frequency or hematuria, positive left flank pain.  She denied rhinorrhea, sore throat, wheezing or hemoptysis.  No chest pain, palpitations, diaphoresis, PND, orthopnea or pitting edema of the lower extremities.  No polyuria, polydipsia, polyphagia or blurred vision.   Lab work: Urinalysis was cloudy with moderate hemoglobin positive nitrites, trace leukocyte esterase, 21-50 RBC, 21-50 WBC and rare bacteria.  CBC showed a white count of 0.9 with 49% neutrophils and 50% lymphocytes, hemoglobin 15.2 g/dL and platelets 827.  BMP showed a potassium of 3.0 and CO2 of 15 mmol/L with an anion gap of 18.  The rest of the electrolytes and renal function were unremarkable.  Glucose was 145 mg/dL.  LFTs showed a direct bilirubin of 0.5 mg deciliter, ALT of 46 and alkaline phosphatase 138 units/L.  The other LFTs were normal.  Imaging: CT renal study showed 4 mm calculus in the left proximal ureter near the UPJ  with mild to moderate left hydronephrosis and perinephric stranding.  Nonobstructing 5 mm calculus in the inferior pole of the left kidney.  Colon diverticulosis without diverticulitis.  Questionable mild nonspecific colitis.  Aortic atherosclerosis.  ED course: Initial vital signs were temperature 98.4 F, pulse 121, respirations 21, BP 139/70 mmHg O2 sat 98% on room air.  The patient received 2 g of ceftriaxone, acetaminophen  650 mg suppository, diphenhydramine  25 mg IVP, hydromorphone  1 mg IVP, ketorolac 15 mg IVP, metoclopramide  10 mg IVP, KCl 10 mEq IVPB and normal saline 2000 mL bolus.  She received another 1000 mL of LR ordered by anesthesia.   Review of Systems: As mentioned in the history of present illness. All other systems reviewed and are negative. Past Medical History:  Diagnosis Date   Allergic rhinitis    uses Flonase  daily as needed and takes CLaritin  daily   Anxiety    takes Xanax  daily as needed   Asthma    Albuterol  inhaler prn;SYmbicort  daily   Cerebral vascular malformation    sees dr mavis for monitoring as needed, sees dr lewitt for headaches every 4 months   COPD (chronic obstructive pulmonary disease) (HCC)    Depression    takes Citalopram  daily   Diverticulitis at age 52   Eczema    Fibromyalgia    GERD (gastroesophageal reflux disease)    takes Protonix  daily   Hard of hearing    History of kidney stones    History of migraine    last one 10+yrs ago   History of staph infection 46yrs ago   Hyperlipidemia    takes Pravastatin  daily   IBS (irritable bowel syndrome)    mixed   Insomnia  Joint pain    Lactose intolerance    Nausea    takes Zofran  daily as needed   PFO (patent foramen ovale)    Plaque psoriasis    PONV (postoperative nausea and vomiting)    Postoperative anemia due to acute blood loss 02/08/2016   Primary localized osteoarthritis of left knee    Primary localized osteoarthritis of right knee 11/03/2015   Rheumatoid  arthritis(714.0) 10/16/2008   oa and ra;Rhemicade IV every 6wks and Metotrexate weekly   Seizures (HCC) 6months ago 03/21/14   takes Depakote  daily   Shortness of breath    with exertion   Sleep apnea    study done >43yrs ago;uses CPAP nightly   Spinal headache    patient states that she thinks she had a spinal headache a long time ago   Stress incontinence    Stroke (HCC) 03/26/2013   left sided weakness   Thyroid  cyst    Past Surgical History:  Procedure Laterality Date   APPENDECTOMY  1981   BIOPSY  12/16/2019   Procedure: BIOPSY;  Surgeon: Kristie Lamprey, MD;  Location: [facility];  Service: Endoscopy;;   BIOPSY  07/19/2023   Procedure: BIOPSY;  Surgeon: Cinderella Deatrice FALCON, MD;  Location: [facility];  Service: Endoscopy;;   CARDIAC CATHETERIZATION  2004   CHOLECYSTECTOMY  1981   COLONOSCOPY     COLONOSCOPY Gwendolyn/A 03/21/2024   Procedure: COLONOSCOPY;  Surgeon: Shaaron Lamar HERO, MD;  Location: [facility];  Service: Endoscopy;  Laterality: Gwendolyn/A;   COLONOSCOPY WITH PROPOFOL  Gwendolyn/A 12/16/2019   Procedure: COLONOSCOPY WITH PROPOFOL ;  Surgeon: Kristie Lamprey, MD;  Location: [facility];  Service: Endoscopy;  Laterality: Gwendolyn/A;   CORONARY PRESSURE/FFR STUDY Gwendolyn/A 07/25/2022   Procedure: INTRAVASCULAR PRESSURE WIRE/FFR STUDY;  Surgeon: Elmira Newman PARAS, MD;  Location: [facility];  Service: Cardiovascular;  Laterality: Gwendolyn/A;   ESOPHAGOGASTRODUODENOSCOPY     ESOPHAGOGASTRODUODENOSCOPY Gwendolyn/A 03/21/2024   Procedure: EGD (ESOPHAGOGASTRODUODENOSCOPY);  Surgeon: Shaaron Lamar HERO, MD;  Location: [facility];  Service: Endoscopy;  Laterality: Gwendolyn/A;   ESOPHAGOGASTRODUODENOSCOPY (EGD) WITH PROPOFOL  Gwendolyn/A 07/19/2023   Procedure: ESOPHAGOGASTRODUODENOSCOPY (EGD) WITH PROPOFOL ;  Surgeon: Cinderella Deatrice FALCON, MD;  [facility]: [facility];  Service: Endoscopy;  Laterality: Gwendolyn/A;  12:45;asa 1-2, bumped to 1:00 for lunch to fit - messaged Tanya   FOOT SURGERY  1999   right ankle   HERNIA REPAIR  2006   x2    INCISIONAL HERNIA REPAIR  05/20/2012   Procedure: HERNIA REPAIR INCISIONAL;  Surgeon: Debby LABOR. Cornett, MD;  Location: [facility];  Service: General;  Laterality: Gwendolyn/A;   INCONTINENCE SURGERY  2010   sling done    KIDNEY STONE SURGERY  2001   KNEE ARTHROSCOPY  1992   left   left foot plating and scarping for arthritis  2011   LEFT HEART CATH AND CORONARY ANGIOGRAPHY Gwendolyn/A 07/25/2022   Procedure: LEFT HEART CATH AND CORONARY ANGIOGRAPHY;  Surgeon: Elmira Newman PARAS, MD;  Location: [facility];  Service: Cardiovascular;  Laterality: Gwendolyn/A;   POLYPECTOMY  07/19/2023   Procedure: POLYPECTOMY;  Surgeon: Cinderella Deatrice FALCON, MD;  [facility];  Service: Endoscopy;;   right ear tube insertion  2011   TEE WITHOUT CARDIOVERSION Gwendolyn/A 05/15/2013   Procedure: TRANSESOPHAGEAL ECHOCARDIOGRAM (TEE);  Surgeon: Erick JONELLE Bergamo, MD;  Location: [facility] END[facility];  Service: Cardiovascular;  Laterality: Gwendolyn/A;   thryoid biopsy     TONSILLECTOMY AND ADENOIDECTOMY  04/01/2014   TONSILLECTOMY AND ADENOIDECTOMY Bilateral  04/01/2014   Procedure: BILATERAL TONSILLECTOMY AND ADENOIDECTOMY;  Surgeon: Ana LELON Moccasin, MD;  Location: [facility] OR;  Service: ENT;  Laterality: Bilateral;   TOTAL ABDOMINAL HYSTERECTOMY  2001   TOTAL KNEE ARTHROPLASTY Right 11/03/2015   TOTAL KNEE ARTHROPLASTY Right 11/03/2015   Procedure: RIGHT TOTAL KNEE ARTHROPLASTY/RIGHT;  Surgeon: Lamar Millman, MD;  Location: [facility] OR;  Service: Orthopedics;  Laterality: Right;   TOTAL KNEE ARTHROPLASTY Left 02/07/2016   Procedure: TOTAL KNEE ARTHROPLASTY;  Surgeon: Lamar Millman, MD;  Location: [facility] OR;  Service: Orthopedics;  Laterality: Left;   TUBAL LIGATION  1990   Social History:  reports that she quit smoking about 24 years ago. Her smoking use included cigarettes. She started smoking about 54 years ago. She has a 60 pack-year smoking history. She has never used smokeless tobacco. She reports current alcohol use. She reports that she does not use  drugs.  Allergies  Allergen Reactions   Aquacel [Carboxymethylcellulose] Other (See Comments)    Blisters    Dextromethorphan Anaphylaxis   Nickel Rash    Turn read   Sulfonamide Derivatives Anaphylaxis, Hives and Swelling    Swelling of tongue   Aspirin  Nausea And Vomiting    Severe GI upset.  Pt states she can tolerate 81 mg asa only.    Ciprofloxacin Itching and Swelling    Angioedema, urticaria   Cosyntropin Hives and Swelling   Amoxicillin  Nausea And Vomiting    GI upset   Diclofenac Sodium Dermatitis    Gel causes legs to break out   Latex Rash    Blisters   Moxifloxacin Other (See Comments)    GI upset   Nsaids Other (See Comments)    Gi upset   Tape Other (See Comments) and Dermatitis    Blisters skin    Family History  Problem Relation Age of Onset   Ovarian cancer Mother    Diabetes Maternal Grandmother    Arthritis Maternal Grandmother    Diabetes Brother    Diabetes Paternal Grandmother    Diabetes Maternal Grandfather    Diabetes Paternal Grandfather    Heart disease Brother    Heart disease Other        Uncle   Breast cancer Maternal Aunt     Prior to Admission medications   Medication Sig Start Date End Date Taking? Authorizing Provider  albuterol  (VENTOLIN  HFA) 108 (90 Base) MCG/ACT inhaler Inhale 1-2 puffs into the lungs every 6 (six) hours as needed for wheezing or shortness of breath.   Yes [provider]  ALPRAZolam  (XANAX ) 0.5 MG tablet Take 0.5 mg by mouth at bedtime. 06/10/24  Yes [provider]  amLODipine  (NORVASC ) 5 MG tablet Take 1 tablet (5 mg total) by mouth daily. 04/14/24  Yes Emelia Josefa HERO, NP  aspirin  EC 81 MG tablet Take 81 mg by mouth at bedtime.   Yes [provider]  dicyclomine  (BENTYL ) 10 MG capsule Take 1 capsule (10 mg total) by mouth 4 (four) times daily -  before meals and at bedtime. Patient taking differently: Take 10 mg by mouth 4 (four) times daily. 02/29/24 07/22/24 Yes Shah, Pratik D, DO   hydrocortisone  (ANUSOL -HC) 25 MG suppository Place 1 suppository (25 mg total) rectally 2 (two) times daily for 10 days. 07/15/24 07/25/24 Yes Ahmed, Deatrice FALCON, MD  hydrOXYzine  (ATARAX ) 10 MG tablet Take 1 tablet (10 mg total) by mouth at bedtime as needed and may repeat dose one time if needed. 07/07/24 08/06/24 Yes Ahmed, Deatrice FALCON, MD  hyoscyamine  (LEVSIN  SL) 0.125 MG SL tablet Place 1 tablet (0.125 mg total) under the tongue every 6 (six) hours as needed. 03/13/24  Yes Carlan, Chelsea L, NP  ipratropium (ATROVENT ) 0.03 % nasal spray Place 2 sprays into the nose in the morning and at bedtime. 05/18/21  Yes [provider]  loratadine  (CLARITIN ) 10 MG tablet Take 10 mg by mouth at bedtime.   Yes [provider]  lubiprostone  (AMITIZA ) 24 MCG capsule Take 1 capsule (24 mcg total) by mouth 2 (two) times daily with a meal. 06/04/24 12/01/24 Yes Ahmed, Deatrice FALCON, MD  nitroGLYCERIN  (NITROSTAT ) 0.4 MG SL tablet Place 1 tablet (0.4 mg total) under the tongue every 5 (five) minutes as needed for chest pain. 03/13/23 07/22/24 Yes Ladona Heinz, MD  OVER THE COUNTER MEDICATION Bio True eye drops daily.   Yes [provider]  pantoprazole  (PROTONIX ) 40 MG tablet TAKE 1 TABLET BY MOUTH DAILY 02/06/24  Yes Ahmed, Deatrice FALCON, MD  pravastatin  (PRAVACHOL ) 40 MG tablet Take 40 mg by mouth daily.   Yes [provider]  PRESCRIPTION MEDICATION In Cholesterol study. Has injection once a month.has been in study for 3 years as of 12/07/23 and has 6 months to left in study. ( Will finish around end of August 2025 )   Yes [provider]  rizatriptan (MAXALT) 5 MG tablet Take 5 mg by mouth as needed for migraine. (not more than 3 a week). May repeat in 2 hours if needed 12/06/23 12/05/24 Yes [provider]  Sacrosidase  (SUCRAID ) 8500 UNIT/ML SOLN Take 2 mLs (17,000 Units total) by mouth with breakfast, with lunch, and with evening meal. Administer half the dose at the beginning of  th meal or snack and other half during the meals Patient taking differently: Take 2 mLs by mouth with breakfast, with lunch, and with evening meal. Administer half the dose at the beginning of th meal or snack and other half during the meals 05/12/24 08/10/24 Yes Ahmed, Deatrice FALCON, MD  tiZANidine (ZANAFLEX) 4 MG tablet Take 1 tablet by mouth every 6 (six) hours as needed for muscle spasms. 10/05/23  Yes [provider]  topiramate  (TOPAMAX ) 50 MG tablet Take 50-100 mg by mouth 2 (two) times daily. 50 mg am and 100 mg pm 10/19/22  Yes [provider]  ursodiol  (ACTIGALL ) 300 MG capsule Take 1 capsule (300 mg total) by mouth 3 (three) times daily. 04/23/24  Yes Cinderella Deatrice FALCON, MD    Physical Exam: Vitals:   07/22/24 1232 07/22/24 1358 07/22/24 1401 07/22/24 1402  BP: 127/65 (!) 77/65  95/71  Pulse: (!) 107 (!) 141  98  Resp: 19 20    Temp: (!) 103.1 F (39.5 C) 98.4 F (36.9 C)    TempSrc: Rectal Oral    SpO2: 94% 95%    Weight:   66 kg   Height:   5' 4 (1.626 m)    Physical Exam Vitals and nursing note reviewed.  Constitutional:      General: She is awake. She is not in acute distress.    Appearance: She is ill-appearing.  HENT:     Head: Normocephalic.     Nose: No rhinorrhea.     Mouth/Throat:     Mouth: Mucous membranes are dry.  Eyes:     General: No scleral icterus.    Pupils: Pupils are equal, round, and reactive to light.  Neck:     Vascular: No JVD.  Cardiovascular:  Rate and Rhythm: Normal rate and regular rhythm.     Heart sounds: S1 normal and S2 normal.  Pulmonary:     Effort: Pulmonary effort is normal.     Breath sounds: Normal breath sounds. No wheezing, rhonchi or rales.  Abdominal:     General: Bowel sounds are normal. There is no distension.     Palpations: Abdomen is soft.     Tenderness: There is abdominal tenderness in the suprapubic area. There is left CVA tenderness. There is no guarding or rebound.  Musculoskeletal:      Cervical back: Neck supple.     Right lower leg: No edema.     Left lower leg: No edema.  Skin:    General: Skin is warm and dry.     Coloration: Skin is pale. Skin is not jaundiced.  Neurological:     General: No focal deficit present.     Mental Status: She is alert and oriented to person, place, and time.  Psychiatric:        Mood and Affect: Mood normal.        Behavior: Behavior normal. Behavior is cooperative.     Data Reviewed:  Results are pending, will review when available.  07/25/2022  LEFT HEART CATH AND CORONARY ANGIOGRAPHY  INTRAVASCULAR PRESSURE WIRE/FFR STUDY   Conclusion  No angiographic evidence of coronary artery disease   Given resting chest pain, risk factors (female patient, smoker) for CMD, the following indices were performed   Resting Pd/Pa: 0.96 FFR 0.93 IMR 22 CFR 3.7   Consider non-cardiac cause of chest pain   Patient has had constant left sided facial numbness for last few days, present before and after the procedure. I do not think this is new finding.  No new focal neurodeficit. No suspicion of acute stroke.    Echocardiogram 07/05/2021: Normal LV systolic function with visual EF 55-60%. Left ventricle cavity is normal in size. Normal left ventricular wall thickness. Normal global wall motion. Normal diastolic filling pattern, normal LAP. Trace tricuspid regurgitation. No evidence of pulmonary hypertension. Mild pulmonic regurgitation. Compared to study 08/27/2017 no significant change. Documented PFO on prior TEE not well visualized on current study.   Assessment and Plan: Principal Problem:   Nausea and vomiting Due to:   Renal colic on left side  In the setting of:   Nephrolithiasis  Complicated by:   Sepsis secondary to UTI (HCC) Admit to SDU/inpatient. Continue IV fluids. Follow-up lactic acid. Analgesics as needed. Antiemetics as needed. Begin cefepime 2 g every 8 hours.   Follow-up urine culture and  sensitivity. Follow-up blood culture and sensitivity Follow CBC and CMP in a.m.  Active Problems:   Neutropenia with fever  Ceftriaxone switched to cefepime. Will follow WBC/neutrophils in AM. Will consult hematology if no improvement.    Obstructive sleep apnea Continue CPAP at bedtime.    Allergic asthma, moderate persistent, uncomplicated   COPD (chronic obstructive pulmonary disease) (HCC) Bronchodilators as needed. Supplemental oxygen as needed.    Complex partial epilepsy (HCC) Continue Topamax  100 mg p.o. bedtime.    Elevated liver enzymes Monitor LFTs. Continue for Sofield 300 mg p.o. 3 times daily.    Gastritis and gastroduodenitis Continue pantoprazole  40 mg p.o. daily.    Chronic idiopathic constipation Continue Amitiza  twice daily.    Hyperlipidemia  Continue pravastatin  40 mg p.o. daily.    Advance Care Planning:   Code Status: Full Code   Consults:   Family Communication:   Severity of Illness: The  appropriate patient status for this patient is INPATIENT. Inpatient status is judged to be reasonable and necessary in order to provide the required intensity of service to ensure the patient's safety. The patient's presenting symptoms, physical exam findings, and initial radiographic and laboratory data in the context of their chronic comorbidities is felt to place them at high risk for further clinical deterioration. Furthermore, it is not anticipated that the patient will be medically stable for discharge from the hospital within 2 midnights of admission.   * I certify that at the point of admission it is my clinical judgment that the patient will require inpatient hospital care spanning beyond 2 midnights from the point of admission due to high intensity of service, high risk for further deterioration and high frequency of surveillance required.*  Author: Alm Dorn Castor, MD 07/22/2024 2:40 PM  For on call review [email or web address].   This document was  prepared using Dragon voice recognition software and may contain some unintended transcription errors.

## 2024-07-22 NOTE — H&P (View-Only) (Signed)
 H&P Physician requesting consult: Fairy Sermon  Chief Complaint: Left ureteral stone, sepsis  History of Present Illness: 63 year old female presented with left-sided flank pain and fever.  Patient was noted to be tachycardic and CT scan was performed there revealed a 4 mm left proximal ureteral calculus with mild to moderate left hydronephrosis.  She also had a nonobstructing left renal calculus.  Colon was also decompressed with mild thickening.  Patient's labs were notable for white blood cell count of 0.9.  Her lactic acid was 5.8.  Requested urgent transfer to [facility] for ureteral stent placement.  Past Medical History:  Diagnosis Date   Allergic rhinitis    uses Flonase  daily as needed and takes CLaritin  daily   Anxiety    takes Xanax  daily as needed   Asthma    Albuterol  inhaler prn;SYmbicort  daily   Cerebral vascular malformation    sees dr mavis for monitoring as needed, sees dr lewitt for headaches every 4 months   COPD (chronic obstructive pulmonary disease) (HCC)    Depression    takes Citalopram  daily   Diverticulitis at age 1   Eczema    Fibromyalgia    GERD (gastroesophageal reflux disease)    takes Protonix  daily   Hard of hearing    History of kidney stones    History of migraine    last one 10+yrs ago   History of staph infection 63yrs ago   Hyperlipidemia    takes Pravastatin  daily   IBS (irritable bowel syndrome)    mixed   Insomnia    Joint pain    Lactose intolerance    Nausea    takes Zofran  daily as needed   PFO (patent foramen ovale)    Plaque psoriasis    PONV (postoperative nausea and vomiting)    Postoperative anemia due to acute blood loss 02/08/2016   Primary localized osteoarthritis of left knee    Primary localized osteoarthritis of right knee 11/03/2015   Rheumatoid arthritis(714.0) 10/16/2008   oa and ra;Rhemicade IV every 6wks and Metotrexate weekly   Seizures (HCC) 6months ago 03/21/14   takes Depakote  daily   Shortness of  breath    with exertion   Sleep apnea    study done >69yrs ago;uses CPAP nightly   Spinal headache    patient states that she thinks she had a spinal headache a long time ago   Stress incontinence    Stroke (HCC) 03/26/2013   left sided weakness   Thyroid  cyst    Past Surgical History:  Procedure Laterality Date   APPENDECTOMY  1981   BIOPSY  12/16/2019   Procedure: BIOPSY;  Surgeon: Kristie Lamprey, MD;  Location: [facility];  Service: Endoscopy;;   BIOPSY  07/19/2023   Procedure: BIOPSY;  Surgeon: Cinderella Deatrice FALCON, MD;  Location: [facility];  Service: Endoscopy;;   CARDIAC CATHETERIZATION  2004   CHOLECYSTECTOMY  1981   COLONOSCOPY     COLONOSCOPY N/A 03/21/2024   Procedure: COLONOSCOPY;  Surgeon: Shaaron Lamar HERO, MD;  [facility]: [facility];  Service: Endoscopy;  Laterality: N/A;   COLONOSCOPY WITH PROPOFOL  N/A 12/16/2019   Procedure: COLONOSCOPY WITH PROPOFOL ;  Surgeon: Kristie Lamprey, MD;  Location: [facility];  Service: Endoscopy;  Laterality: N/A;   CORONARY PRESSURE/FFR STUDY N/A 07/25/2022   Procedure: INTRAVASCULAR PRESSURE WIRE/FFR STUDY;  Surgeon: Elmira Newman PARAS, MD;  [facility]: [facility];  Service: Cardiovascular;  Laterality: N/A;   ESOPHAGOGASTRODUODENOSCOPY     ESOPHAGOGASTRODUODENOSCOPY N/A 03/21/2024  Procedure: EGD (ESOPHAGOGASTRODUODENOSCOPY);  Surgeon: Shaaron Lamar HERO, MD;  [facility]: [facility];  Service: Endoscopy;  Laterality: N/A;   ESOPHAGOGASTRODUODENOSCOPY (EGD) WITH PROPOFOL  N/A 07/19/2023   Procedure: ESOPHAGOGASTRODUODENOSCOPY (EGD) WITH PROPOFOL ;  Surgeon: Cinderella Deatrice FALCON, MD;  [facility]: [facility];  Service: Endoscopy;  Laterality: N/A;  12:45;asa 1-2, bumped to 1:00 for lunch to fit - messaged Tanya   FOOT SURGERY  1999   right ankle   HERNIA REPAIR  2006   x2   INCISIONAL HERNIA REPAIR  05/20/2012   Procedure: HERNIA REPAIR INCISIONAL;  Surgeon: Debby LABOR. Cornett, MD;  Location: [facility];  Service: General;  Laterality: N/A;    INCONTINENCE SURGERY  2010   sling done    KIDNEY STONE SURGERY  2001   KNEE ARTHROSCOPY  1992   left   left foot plating and scarping for arthritis  2011   LEFT HEART CATH AND CORONARY ANGIOGRAPHY N/A 07/25/2022   Procedure: LEFT HEART CATH AND CORONARY ANGIOGRAPHY;  Surgeon: Elmira Newman PARAS, MD;  [facility];  Service: Cardiovascular;  Laterality: N/A;   POLYPECTOMY  07/19/2023   Procedure: POLYPECTOMY;  Surgeon: Cinderella Deatrice FALCON, MD;  [facility];  Service: Endoscopy;;   right ear tube insertion  2011   TEE WITHOUT CARDIOVERSION N/A 05/15/2013   Procedure: TRANSESOPHAGEAL ECHOCARDIOGRAM (TEE);  Surgeon: Erick JONELLE Bergamo, MD;  Location: [facility] ENDOSCOPY;  Service: Cardiovascular;  Laterality: N/A;   thryoid biopsy     TONSILLECTOMY AND ADENOIDECTOMY  04/01/2014   TONSILLECTOMY AND ADENOIDECTOMY Bilateral 04/01/2014   Procedure: BILATERAL TONSILLECTOMY AND ADENOIDECTOMY;  Surgeon: Ana LELON Moccasin, MD;  [facility];  Service: ENT;  Laterality: Bilateral;   TOTAL ABDOMINAL HYSTERECTOMY  2001   TOTAL KNEE ARTHROPLASTY Right 11/03/2015   TOTAL KNEE ARTHROPLASTY Right 11/03/2015   Procedure: RIGHT TOTAL KNEE ARTHROPLASTY/RIGHT;  Surgeon: Lamar Millman, MD;  [facility]: [facility] OR;  Service: Orthopedics;  Laterality: Right;   TOTAL KNEE ARTHROPLASTY Left 02/07/2016   Procedure: TOTAL KNEE ARTHROPLASTY;  Surgeon: Lamar Millman, MD;  Location: [facility] OR;  Service: Orthopedics;  Laterality: Left;   TUBAL LIGATION  1990    Home Medications:  (Not in a hospital admission)  Allergies:  Allergies  Allergen Reactions   Aquacel [Carboxymethylcellulose] Other (See Comments)    Blisters    Dextromethorphan Anaphylaxis   Nickel Rash    Turn read   Sulfonamide Derivatives Anaphylaxis, Hives and Swelling    Swelling of tongue   Aspirin  Nausea And Vomiting    Severe GI upset.  Pt states she can tolerate 81 mg asa only.    Ciprofloxacin Itching and Swelling    Angioedema,  urticaria   Cosyntropin Hives and Swelling   Amoxicillin  Nausea And Vomiting    GI upset   Diclofenac Sodium Dermatitis    Gel causes legs to break out   Latex Rash    Blisters   Moxifloxacin Other (See Comments)    GI upset   Nsaids Other (See Comments)    Gi upset   Tape Other (See Comments) and Dermatitis    Blisters skin    Family History  Problem Relation Age of Onset   Ovarian cancer Mother    Diabetes Maternal Grandmother    Arthritis Maternal Grandmother    Diabetes Brother    Diabetes Paternal Grandmother    Diabetes Maternal Grandfather    Diabetes Paternal Grandfather    Heart disease Brother    Heart disease Other  Uncle   Breast cancer Maternal Aunt    Social History:  reports that she quit smoking about 24 years ago. Her smoking use included cigarettes. She started smoking about 54 years ago. She has a 60 pack-year smoking history. She has never used smokeless tobacco. She reports current alcohol use. She reports that she does not use drugs.  ROS: A complete review of systems was performed.  All systems are negative except for pertinent findings as noted. ROS   Physical Exam:  Vital signs in last 24 hours: Temp:  [98.4 F (36.9 C)-103.1 F (39.5 C)] 103.1 F (39.5 C) (10/07 1232) Pulse Rate:  [107-124] 107 (10/07 1232) Resp:  [18-21] 19 (10/07 1232) BP: (127-139)/(65-78) 127/65 (10/07 1232) SpO2:  [94 %-98 %] 94 % (10/07 1232) Weight:  [66.5 kg] 66.5 kg (10/07 0901) General:  Alert and oriented, No acute distress HEENT: Normocephalic, atraumatic Neck: No JVD or lymphadenopathy Cardiovascular: Regular rate and rhythm Lungs: Regular rate and effort Abdomen: Soft, nontender, nondistended, no abdominal masses Back: No CVA tenderness Extremities: No edema Neurologic: Grossly intact  Laboratory Data:  Results for orders placed or performed during the hospital encounter of 07/22/24 (from the past 24 hours)  Basic metabolic panel     Status:  Abnormal   Collection Time: 07/22/24  9:07 AM  Result Value Ref Range   Sodium 140 135 - 145 mmol/L   Potassium 3.0 (L) 3.5 - 5.1 mmol/L   Chloride 107 98 - 111 mmol/L   CO2 15 (L) 22 - 32 mmol/L   Glucose, Bld 145 (H) 70 - 99 mg/dL   BUN 7 (L) 8 - 23 mg/dL   Creatinine, Ser 9.19 0.44 - 1.00 mg/dL   Calcium  9.4 8.9 - 10.3 mg/dL   GFR, Estimated >39 >39 mL/min   Anion gap 18 (H) 5 - 15  CBC     Status: Abnormal   Collection Time: 07/22/24  9:07 AM  Result Value Ref Range   WBC 0.9 (LL) 4.0 - 10.5 K/uL   RBC 4.72 3.87 - 5.11 MIL/uL   Hemoglobin 15.2 (H) 12.0 - 15.0 g/dL   HCT 54.5 63.9 - 53.9 %   MCV 96.2 80.0 - 100.0 fL   MCH 32.2 26.0 - 34.0 pg   MCHC 33.5 30.0 - 36.0 g/dL   RDW 87.0 88.4 - 84.4 %   Platelets 172 150 - 400 K/uL   nRBC 0.0 0.0 - 0.2 %  Hepatic function panel     Status: Abnormal   Collection Time: 07/22/24  9:07 AM  Result Value Ref Range   Total Protein 7.3 6.5 - 8.1 g/dL   Albumin 4.4 3.5 - 5.0 g/dL   AST 36 15 - 41 U/L   ALT 46 (H) 0 - 44 U/L   Alkaline Phosphatase 138 (H) 38 - 126 U/L   Total Bilirubin 0.9 0.0 - 1.2 mg/dL   Bilirubin, Direct 0.5 (H) 0.0 - 0.2 mg/dL   Indirect Bilirubin 0.4 0.3 - 0.9 mg/dL  Differential     Status: Abnormal   Collection Time: 07/22/24  9:07 AM  Result Value Ref Range   Neutrophils Relative % 49 %   Neutro Abs 0.4 (LL) 1.7 - 7.7 K/uL   Lymphocytes Relative 50 %   Lymphs Abs 0.5 (L) 0.7 - 4.0 K/uL   Monocytes Relative 0 %   Monocytes Absolute 0.0 (L) 0.1 - 1.0 K/uL   Eosinophils Relative 1 %   Eosinophils Absolute 0.0 0.0 - 0.5 K/uL  Basophils Relative 0 %   Basophils Absolute 0.0 0.0 - 0.1 K/uL   Smear Review Normal platelet morphology    Large Granular Lymphocytes PRESENT    Reactive, Benign Lymphocytes PRESENT    Ovalocytes PRESENT   Urinalysis, Routine w reflex microscopic -Urine, Clean Catch     Status: Abnormal   Collection Time: 07/22/24  9:08 AM  Result Value Ref Range   Color, Urine YELLOW YELLOW    APPearance CLOUDY (A) CLEAR   Specific Gravity, Urine 1.013 1.005 - 1.030   pH 7.0 5.0 - 8.0   Glucose, UA NEGATIVE NEGATIVE mg/dL   Hgb urine dipstick MODERATE (A) NEGATIVE   Bilirubin Urine NEGATIVE NEGATIVE   Ketones, ur NEGATIVE NEGATIVE mg/dL   Protein, ur 30 (A) NEGATIVE mg/dL   Nitrite POSITIVE (A) NEGATIVE   Leukocytes,Ua TRACE (A) NEGATIVE   RBC / HPF 21-50 0 - 5 RBC/hpf   WBC, UA 21-50 0 - 5 WBC/hpf   Bacteria, UA RARE (A) NONE SEEN   Squamous Epithelial / HPF 0-5 0 - 5 /HPF   Mucus PRESENT    Budding Yeast PRESENT   Blood gas, venous (at [facility])     Status: Abnormal   Collection Time: 07/22/24 11:32 AM  Result Value Ref Range   pH, Ven 7.23 (L) 7.25 - 7.43   pCO2, Ven 38 (L) 44 - 60 mmHg   pO2, Ven 31 (LL) 32 - 45 mmHg   Bicarbonate 15.9 (L) 20.0 - 28.0 mmol/L   Acid-base deficit 10.7 (H) 0.0 - 2.0 mmol/L   O2 Saturation 39.7 %   Patient temperature 37.7    Collection site BLOOD LEFT HAND    Drawn by [id number]   Blood culture (routine x 2)     Status: None (Preliminary result)   Collection Time: 07/22/24 11:32 AM   Specimen: BLOOD  Result Value Ref Range   Specimen Description BLOOD BLOOD LEFT HAND    Special Requests      BOTTLES DRAWN AEROBIC AND ANAEROBIC Blood Culture results may not be optimal due to an inadequate volume of blood received in culture bottles Performed at [facility], [address] [place], [place] [address]    Culture PENDING    Report Status PENDING   Lactic acid, plasma     Status: Abnormal   Collection Time: 07/22/24 11:32 AM  Result Value Ref Range   Lactic Acid, Venous 5.8 (HH) 0.5 - 1.9 mmol/L  Blood culture (routine x 2)     Status: None (Preliminary result)   Collection Time: 07/22/24 11:34 AM   Specimen: BLOOD  Result Value Ref Range   Specimen Description BLOOD BLOOD LEFT HAND    Special Requests      BOTTLES DRAWN AEROBIC ONLY Blood Culture results may not be optimal due to an inadequate volume of blood received in culture  bottles Performed at [facility], [address], [place], [place] [address]    Culture PENDING    Report Status PENDING    Recent Results (from the past 240 hours)  Blood culture (routine x 2)     Status: None (Preliminary result)   Collection Time: 07/22/24 11:32 AM   Specimen: BLOOD  Result Value Ref Range Status   Specimen Description BLOOD BLOOD LEFT HAND  Final   Special Requests   Final    BOTTLES DRAWN AEROBIC AND ANAEROBIC Blood Culture results may not be optimal due to an inadequate volume of blood received in culture bottles Performed at  [facility], [address], [place], [place] [address]    Culture PENDING  Incomplete   Report Status PENDING  Incomplete  Blood culture (routine x 2)     Status: None (Preliminary result)   Collection Time: 07/22/24 11:34 AM   Specimen: BLOOD  Result Value Ref Range Status   Specimen Description BLOOD BLOOD LEFT HAND  Final   Special Requests   Final    BOTTLES DRAWN AEROBIC ONLY Blood Culture results may not be optimal due to an inadequate volume of blood received in culture bottles Performed at [facility], [address], [place], [place] [address]    Culture PENDING  Incomplete   Report Status PENDING  Incomplete   Creatinine: Recent Labs    07/22/24 9092  CREATININE 0.80    Impression/Assessment:  Left ureteral calculus Left ureteral obstruction secondary to calculus Sepsis secondary to urinary tract infection  Plan:  Proceed with cystoscopy with left retrograde pyelogram, left ureteral stent placement.  Risk benefits discussed.  I will request hospitalist evaluation.  Sherwood JONETTA Edison, III 07/22/2024, 1:01 PM

## 2024-07-22 NOTE — Progress Notes (Addendum)
 Spoke with [facility].  [facility] is sending truck out to pick up patinet to bring to [facility] long short stay for surgery.  12:03 PM Spoke with Dr Carolee who agrees to bring patient to short stay  Harlene Prey BSN, RN RN Speciality Coordinator - Perioperative Services [facility] [phone]

## 2024-07-22 NOTE — Anesthesia Postprocedure Evaluation (Signed)
 Anesthesia Post Note  Patient: Channing A Corcino  Procedure(s) Performed: CYSTOSCOPY, WITH STENT INSERTION (Left)     Patient location during evaluation: PACU Anesthesia Type: General Level of consciousness: awake and alert Pain management: pain level controlled Vital Signs Assessment: post-procedure vital signs reviewed and stable Respiratory status: spontaneous breathing, nonlabored ventilation, respiratory function stable and patient connected to nasal cannula oxygen Cardiovascular status: blood pressure returned to baseline and stable Postop Assessment: no apparent nausea or vomiting Anesthetic complications: no   No notable events documented.  Last Vitals:  Vitals:   07/22/24 1635 07/22/24 1640  BP: (!) 90/52   Pulse: (!) 109 (!) 107  Resp: (!) 25 19  Temp: 37.2 C   SpO2: 99% 96%    Last Pain:  Vitals:   07/22/24 1635  TempSrc: Oral  PainSc: 4                  Garnette DELENA Gab

## 2024-07-22 NOTE — Anesthesia Procedure Notes (Signed)
 Procedure Name: LMA Insertion Date/Time: 07/22/2024 2:29 PM  Performed by: Augusta Daved SAILOR, CRNAPre-anesthesia Checklist: Patient identified, Emergency Drugs available, Suction available and Patient being monitored Patient Re-evaluated:Patient Re-evaluated prior to induction Oxygen Delivery Method: Circle System Utilized Preoxygenation: Pre-oxygenation with 100% oxygen Induction Type: IV induction Ventilation: Mask ventilation without difficulty LMA: LMA inserted LMA Size: 4.0 Number of attempts: 1 Airway Equipment and Method: Bite block Placement Confirmation: positive ETCO2 Tube secured with: Tape Dental Injury: Teeth and Oropharynx as per pre-operative assessment

## 2024-07-22 NOTE — Consult Note (Signed)
 H&P Physician requesting consult: Fairy Sermon  Chief Complaint: Left ureteral stone, sepsis  History of Present Illness: 63 year old female presented with left-sided flank pain and fever.  Patient was noted to be tachycardic and CT scan was performed there revealed a 4 mm left proximal ureteral calculus with mild to moderate left hydronephrosis.  She also had a nonobstructing left renal calculus.  Colon was also decompressed with mild thickening.  Patient's labs were notable for white blood cell count of 0.9.  Her lactic acid was 5.8.  Requested urgent transfer to [facility] for ureteral stent placement.  Past Medical History:  Diagnosis Date   Allergic rhinitis    uses Flonase  daily as needed and takes CLaritin  daily   Anxiety    takes Xanax  daily as needed   Asthma    Albuterol  inhaler prn;SYmbicort  daily   Cerebral vascular malformation    sees dr mavis for monitoring as needed, sees dr lewitt for headaches every 4 months   COPD (chronic obstructive pulmonary disease) (HCC)    Depression    takes Citalopram  daily   Diverticulitis at age 1   Eczema    Fibromyalgia    GERD (gastroesophageal reflux disease)    takes Protonix  daily   Hard of hearing    History of kidney stones    History of migraine    last one 10+yrs ago   History of staph infection 63yrs ago   Hyperlipidemia    takes Pravastatin  daily   IBS (irritable bowel syndrome)    mixed   Insomnia    Joint pain    Lactose intolerance    Nausea    takes Zofran  daily as needed   PFO (patent foramen ovale)    Plaque psoriasis    PONV (postoperative nausea and vomiting)    Postoperative anemia due to acute blood loss 02/08/2016   Primary localized osteoarthritis of left knee    Primary localized osteoarthritis of right knee 11/03/2015   Rheumatoid arthritis(714.0) 10/16/2008   oa and ra;Rhemicade IV every 6wks and Metotrexate weekly   Seizures (HCC) 6months ago 03/21/14   takes Depakote  daily   Shortness of  breath    with exertion   Sleep apnea    study done >69yrs ago;uses CPAP nightly   Spinal headache    patient states that she thinks she had a spinal headache a long time ago   Stress incontinence    Stroke (HCC) 03/26/2013   left sided weakness   Thyroid  cyst    Past Surgical History:  Procedure Laterality Date   APPENDECTOMY  1981   BIOPSY  12/16/2019   Procedure: BIOPSY;  Surgeon: Kristie Lamprey, MD;  Location: [facility];  Service: Endoscopy;;   BIOPSY  07/19/2023   Procedure: BIOPSY;  Surgeon: Cinderella Deatrice FALCON, MD;  Location: [facility];  Service: Endoscopy;;   CARDIAC CATHETERIZATION  2004   CHOLECYSTECTOMY  1981   COLONOSCOPY     COLONOSCOPY N/A 03/21/2024   Procedure: COLONOSCOPY;  Surgeon: Shaaron Lamar HERO, MD;  Location: [facility];  Service: Endoscopy;  Laterality: N/A;   COLONOSCOPY WITH PROPOFOL  N/A 12/16/2019   Procedure: COLONOSCOPY WITH PROPOFOL ;  Surgeon: Kristie Lamprey, MD;  Location: [facility];  Service: Endoscopy;  Laterality: N/A;   CORONARY PRESSURE/FFR STUDY N/A 07/25/2022   Procedure: INTRAVASCULAR PRESSURE WIRE/FFR STUDY;  Surgeon: Elmira Newman PARAS, MD;  [facility]: [facility];  Service: Cardiovascular;  Laterality: N/A;   ESOPHAGOGASTRODUODENOSCOPY     ESOPHAGOGASTRODUODENOSCOPY N/A 03/21/2024  Procedure: EGD (ESOPHAGOGASTRODUODENOSCOPY);  Surgeon: Shaaron Lamar HERO, MD;  [facility]: [facility];  Service: Endoscopy;  Laterality: N/A;   ESOPHAGOGASTRODUODENOSCOPY (EGD) WITH PROPOFOL  N/A 07/19/2023   Procedure: ESOPHAGOGASTRODUODENOSCOPY (EGD) WITH PROPOFOL ;  Surgeon: Cinderella Deatrice FALCON, MD;  [facility]: [facility];  Service: Endoscopy;  Laterality: N/A;  12:45;asa 1-2, bumped to 1:00 for lunch to fit - messaged Tanya   FOOT SURGERY  1999   right ankle   HERNIA REPAIR  2006   x2   INCISIONAL HERNIA REPAIR  05/20/2012   Procedure: HERNIA REPAIR INCISIONAL;  Surgeon: Debby LABOR. Cornett, MD;  Location: [facility];  Service: General;  Laterality: N/A;    INCONTINENCE SURGERY  2010   sling done    KIDNEY STONE SURGERY  2001   KNEE ARTHROSCOPY  1992   left   left foot plating and scarping for arthritis  2011   LEFT HEART CATH AND CORONARY ANGIOGRAPHY N/A 07/25/2022   Procedure: LEFT HEART CATH AND CORONARY ANGIOGRAPHY;  Surgeon: Elmira Newman PARAS, MD;  [facility];  Service: Cardiovascular;  Laterality: N/A;   POLYPECTOMY  07/19/2023   Procedure: POLYPECTOMY;  Surgeon: Cinderella Deatrice FALCON, MD;  [facility];  Service: Endoscopy;;   right ear tube insertion  2011   TEE WITHOUT CARDIOVERSION N/A 05/15/2013   Procedure: TRANSESOPHAGEAL ECHOCARDIOGRAM (TEE);  Surgeon: Erick JONELLE Bergamo, MD;  Location: [facility] ENDOSCOPY;  Service: Cardiovascular;  Laterality: N/A;   thryoid biopsy     TONSILLECTOMY AND ADENOIDECTOMY  04/01/2014   TONSILLECTOMY AND ADENOIDECTOMY Bilateral 04/01/2014   Procedure: BILATERAL TONSILLECTOMY AND ADENOIDECTOMY;  Surgeon: Ana LELON Moccasin, MD;  Location[facility];  Service: ENT;  Laterality: Bilateral;   TOTAL ABDOMINAL HYSTERECTOMY  2001   TOTAL KNEE ARTHROPLASTY Right 11/03/2015   TOTAL KNEE ARTHROPLASTY Right 11/03/2015   Procedure: RIGHT TOTAL KNEE ARTHROPLASTY/RIGHT;  Surgeon: Lamar Millman, MD;  Location: [facility] OR;  Service: Orthopedics;  Laterality: Right;   TOTAL KNEE ARTHROPLASTY Left 02/07/2016   Procedure: TOTAL KNEE ARTHROPLASTY;  Surgeon: Lamar Millman, MD;  Location: [facility] OR;  Service: Orthopedics;  Laterality: Left;   TUBAL LIGATION  1990    Home Medications:  (Not in a hospital admission)  Allergies:  Allergies  Allergen Reactions   Aquacel [Carboxymethylcellulose] Other (See Comments)    Blisters    Dextromethorphan Anaphylaxis   Nickel Rash    Turn read   Sulfonamide Derivatives Anaphylaxis, Hives and Swelling    Swelling of tongue   Aspirin  Nausea And Vomiting    Severe GI upset.  Pt states she can tolerate 81 mg asa only.    Ciprofloxacin Itching and Swelling    Angioedema,  urticaria   Cosyntropin Hives and Swelling   Amoxicillin  Nausea And Vomiting    GI upset   Diclofenac Sodium Dermatitis    Gel causes legs to break out   Latex Rash    Blisters   Moxifloxacin Other (See Comments)    GI upset   Nsaids Other (See Comments)    Gi upset   Tape Other (See Comments) and Dermatitis    Blisters skin    Family History  Problem Relation Age of Onset   Ovarian cancer Mother    Diabetes Maternal Grandmother    Arthritis Maternal Grandmother    Diabetes Brother    Diabetes Paternal Grandmother    Diabetes Maternal Grandfather    Diabetes Paternal Grandfather    Heart disease Brother    Heart disease Other  Uncle   Breast cancer Maternal Aunt    Social History:  reports that she quit smoking about 24 years ago. Her smoking use included cigarettes. She started smoking about 54 years ago. She has a 60 pack-year smoking history. She has never used smokeless tobacco. She reports current alcohol use. She reports that she does not use drugs.  ROS: A complete review of systems was performed.  All systems are negative except for pertinent findings as noted. ROS   Physical Exam:  Vital signs in last 24 hours: Temp:  [98.4 F (36.9 C)-103.1 F (39.5 C)] 103.1 F (39.5 C) (10/07 1232) Pulse Rate:  [107-124] 107 (10/07 1232) Resp:  [18-21] 19 (10/07 1232) BP: (127-139)/(65-78) 127/65 (10/07 1232) SpO2:  [94 %-98 %] 94 % (10/07 1232) Weight:  [66.5 kg] 66.5 kg (10/07 0901) General:  Alert and oriented, No acute distress HEENT: Normocephalic, atraumatic Neck: No JVD or lymphadenopathy Cardiovascular: Regular rate and rhythm Lungs: Regular rate and effort Abdomen: Soft, nontender, nondistended, no abdominal masses Back: No CVA tenderness Extremities: No edema Neurologic: Grossly intact  Laboratory Data:  Results for orders placed or performed during the hospital encounter of 07/22/24 (from the past 24 hours)  Basic metabolic panel     Status:  Abnormal   Collection Time: 07/22/24  9:07 AM  Result Value Ref Range   Sodium 140 135 - 145 mmol/L   Potassium 3.0 (L) 3.5 - 5.1 mmol/L   Chloride 107 98 - 111 mmol/L   CO2 15 (L) 22 - 32 mmol/L   Glucose, Bld 145 (H) 70 - 99 mg/dL   BUN 7 (L) 8 - 23 mg/dL   Creatinine, Ser 9.19 0.44 - 1.00 mg/dL   Calcium  9.4 8.9 - 10.3 mg/dL   GFR, Estimated >39 >39 mL/min   Anion gap 18 (H) 5 - 15  CBC     Status: Abnormal   Collection Time: 07/22/24  9:07 AM  Result Value Ref Range   WBC 0.9 (LL) 4.0 - 10.5 K/uL   RBC 4.72 3.87 - 5.11 MIL/uL   Hemoglobin 15.2 (H) 12.0 - 15.0 g/dL   HCT 54.5 63.9 - 53.9 %   MCV 96.2 80.0 - 100.0 fL   MCH 32.2 26.0 - 34.0 pg   MCHC 33.5 30.0 - 36.0 g/dL   RDW 87.0 88.4 - 84.4 %   Platelets 172 150 - 400 K/uL   nRBC 0.0 0.0 - 0.2 %  Hepatic function panel     Status: Abnormal   Collection Time: 07/22/24  9:07 AM  Result Value Ref Range   Total Protein 7.3 6.5 - 8.1 g/dL   Albumin 4.4 3.5 - 5.0 g/dL   AST 36 15 - 41 U/L   ALT 46 (H) 0 - 44 U/L   Alkaline Phosphatase 138 (H) 38 - 126 U/L   Total Bilirubin 0.9 0.0 - 1.2 mg/dL   Bilirubin, Direct 0.5 (H) 0.0 - 0.2 mg/dL   Indirect Bilirubin 0.4 0.3 - 0.9 mg/dL  Differential     Status: Abnormal   Collection Time: 07/22/24  9:07 AM  Result Value Ref Range   Neutrophils Relative % 49 %   Neutro Abs 0.4 (LL) 1.7 - 7.7 K/uL   Lymphocytes Relative 50 %   Lymphs Abs 0.5 (L) 0.7 - 4.0 K/uL   Monocytes Relative 0 %   Monocytes Absolute 0.0 (L) 0.1 - 1.0 K/uL   Eosinophils Relative 1 %   Eosinophils Absolute 0.0 0.0 - 0.5 K/uL  Basophils Relative 0 %   Basophils Absolute 0.0 0.0 - 0.1 K/uL   Smear Review Normal platelet morphology    Large Granular Lymphocytes PRESENT    Reactive, Benign Lymphocytes PRESENT    Ovalocytes PRESENT   Urinalysis, Routine w reflex microscopic -Urine, Clean Catch     Status: Abnormal   Collection Time: 07/22/24  9:08 AM  Result Value Ref Range   Color, Urine YELLOW YELLOW    APPearance CLOUDY (A) CLEAR   Specific Gravity, Urine 1.013 1.005 - 1.030   pH 7.0 5.0 - 8.0   Glucose, UA NEGATIVE NEGATIVE mg/dL   Hgb urine dipstick MODERATE (A) NEGATIVE   Bilirubin Urine NEGATIVE NEGATIVE   Ketones, ur NEGATIVE NEGATIVE mg/dL   Protein, ur 30 (A) NEGATIVE mg/dL   Nitrite POSITIVE (A) NEGATIVE   Leukocytes,Ua TRACE (A) NEGATIVE   RBC / HPF 21-50 0 - 5 RBC/hpf   WBC, UA 21-50 0 - 5 WBC/hpf   Bacteria, UA RARE (A) NONE SEEN   Squamous Epithelial / HPF 0-5 0 - 5 /HPF   Mucus PRESENT    Budding Yeast PRESENT   Blood gas, venous (at [facility])     Status: Abnormal   Collection Time: 07/22/24 11:32 AM  Result Value Ref Range   pH, Ven 7.23 (L) 7.25 - 7.43   pCO2, Ven 38 (L) 44 - 60 mmHg   pO2, Ven 31 (LL) 32 - 45 mmHg   Bicarbonate 15.9 (L) 20.0 - 28.0 mmol/L   Acid-base deficit 10.7 (H) 0.0 - 2.0 mmol/L   O2 Saturation 39.7 %   Patient temperature 37.7    Collection site BLOOD LEFT HAND    Drawn by [id number]   Blood culture (routine x 2)     Status: None (Preliminary result)   Collection Time: 07/22/24 11:32 AM   Specimen: BLOOD  Result Value Ref Range   Specimen Description BLOOD BLOOD LEFT HAND    Special Requests      BOTTLES DRAWN AEROBIC AND ANAEROBIC Blood Culture results may not be optimal due to an inadequate volume of blood received in culture bottles Performed at [facility], [address] [place], [place] [address]    Culture PENDING    Report Status PENDING   Lactic acid, plasma     Status: Abnormal   Collection Time: 07/22/24 11:32 AM  Result Value Ref Range   Lactic Acid, Venous 5.8 (HH) 0.5 - 1.9 mmol/L  Blood culture (routine x 2)     Status: None (Preliminary result)   Collection Time: 07/22/24 11:34 AM   Specimen: BLOOD  Result Value Ref Range   Specimen Description BLOOD BLOOD LEFT HAND    Special Requests      BOTTLES DRAWN AEROBIC ONLY Blood Culture results may not be optimal due to an inadequate volume of blood received in culture  bottles Performed at [facility], [address], [place], [place] [address]    Culture PENDING    Report Status PENDING    Recent Results (from the past 240 hours)  Blood culture (routine x 2)     Status: None (Preliminary result)   Collection Time: 07/22/24 11:32 AM   Specimen: BLOOD  Result Value Ref Range Status   Specimen Description BLOOD BLOOD LEFT HAND  Final   Special Requests   Final    BOTTLES DRAWN AEROBIC AND ANAEROBIC Blood Culture results may not be optimal due to an inadequate volume of blood received in culture bottles Performed at  [facility], [address], [place], [place] [address]    Culture PENDING  Incomplete   Report Status PENDING  Incomplete  Blood culture (routine x 2)     Status: None (Preliminary result)   Collection Time: 07/22/24 11:34 AM   Specimen: BLOOD  Result Value Ref Range Status   Specimen Description BLOOD BLOOD LEFT HAND  Final   Special Requests   Final    BOTTLES DRAWN AEROBIC ONLY Blood Culture results may not be optimal due to an inadequate volume of blood received in culture bottles Performed at [facility], [address], [place], [place] [address]    Culture PENDING  Incomplete   Report Status PENDING  Incomplete   Creatinine: Recent Labs    07/22/24 9092  CREATININE 0.80    Impression/Assessment:  Left ureteral calculus Left ureteral obstruction secondary to calculus Sepsis secondary to urinary tract infection  Plan:  Proceed with cystoscopy with left retrograde pyelogram, left ureteral stent placement.  Risk benefits discussed.  I will request hospitalist evaluation.  Sherwood JONETTA Edison, III 07/22/2024, 1:01 PM

## 2024-07-22 NOTE — Anesthesia Preprocedure Evaluation (Addendum)
 Anesthesia Evaluation  Patient identified by MRN, date of birth, ID band Patient awake    Reviewed: Allergy & Precautions, NPO status , Patient's Chart, lab work & pertinent test results  History of Anesthesia Complications (+) PONV, POST - OP SPINAL HEADACHE and history of anesthetic complications  Airway Mallampati: II  TM Distance: >3 FB Neck ROM: Full    Dental no notable dental hx. (+) Teeth Intact, Dental Advisory Given   Pulmonary sleep apnea and Continuous Positive Airway Pressure Ventilation , COPD, former smoker   Pulmonary exam normal breath sounds clear to auscultation       Cardiovascular (-) angina (-) Past MI Normal cardiovascular exam Rhythm:Regular Rate:Normal  Echocardiogram 07/05/2021: Normal LV systolic function with visual EF 55-60%. Left ventricle cavity is normal in size. Normal left ventricular wall thickness. Normal global wall motion. Normal diastolic filling pattern, normal LAP. Trace tricuspid regurgitation. No evidence of pulmonary hypertension. Mild pulmonic regurgitation. Compared to study 08/27/2017 no significant change. Documented PFO on prior TEE not well visualized on current study.     Neuro/Psych Seizures -,  CVA (L sided weakness), Residual Symptoms    GI/Hepatic ,GERD  ,,(+) Hepatitis -, A  Endo/Other    Renal/GU Lab Results      Component                Value               Date                        K                        3.0 (L)             07/22/2024                CO2                      15 (L)              07/22/2024                BUN                      7 (L)               07/22/2024                CREATININE               0.80                07/22/2024                GFRNONAA                 >60                 07/22/2024                CALCIUM                   9.4                 07/22/2024                 Musculoskeletal  (+) Arthritis , Osteoarthritis and  Rheumatoid disorders,  Fibromyalgia -  Abdominal   Peds  Hematology Lab Results      Component                Value               Date                      WBC                      0.9 (LL)            07/22/2024                HGB                      15.2 (H)            07/22/2024                HCT                      45.4                07/22/2024                MCV                      96.2                07/22/2024                PLT                      172                 07/22/2024              Anesthesia Other Findings All: ASA, Cipro, Nsaids, Moxifloxacin, amoxicillin   Reproductive/Obstetrics                              Anesthesia Physical Anesthesia Plan  ASA: 3 and emergent  Anesthesia Plan: General   Post-op Pain Management: Tylenol  PO (pre-op)*   Induction: Intravenous  PONV Risk Score and Plan: Treatment may vary due to age or medical condition, Ondansetron  and Midazolam   Airway Management Planned: LMA  Additional Equipment: None  Intra-op Plan:   Post-operative Plan:   Informed Consent: I have reviewed the patients History and Physical, chart, labs and discussed the procedure including the risks, benefits and alternatives for the proposed anesthesia with the patient or authorized representative who has indicated his/her understanding and acceptance.     Dental advisory given  Plan Discussed with:   Anesthesia Plan Comments:          Anesthesia Quick Evaluation

## 2024-07-22 NOTE — Transfer of Care (Addendum)
 Immediate Anesthesia Transfer of Care Note  Patient: [name]  Procedure(s) Performed: CYSTOSCOPY, WITH STENT INSERTION (Left)  Patient Location: PACU  Anesthesia Type:General  Level of Consciousness: drowsy and patient cooperative  Airway & Oxygen Therapy: Patient Spontanous Breathing and Patient connected to face mask oxygen  Post-op Assessment: Report given to RN and Post -op Vital signs reviewed and stable  Post vital signs: Reviewed and stable  Last Vitals:  Vitals Value Taken Time  BP 149/136 07/22/24 15:07  Temp 37.2 07/22/24   15:07  Pulse 143 07/22/24 15:10  Resp 27 07/22/24 15:10  SpO2 100 % 07/22/24 15:10  Vitals shown include unfiled device data.  Last Pain:  Vitals:   07/22/24 1510  TempSrc:   PainSc: 10-Worst pain ever         Complications:  Patient tachycardic 120's-140's in PACU. Patient moaning reaching for stent. Blood pressure elevated. Dr. Keneth present. Patient remains febrile. Plan to administer dilaudid .

## 2024-07-22 NOTE — ED Triage Notes (Signed)
 Pt c/o n/v and left flank pain. Pt states taking a hydrocodone  at home and a zofran  with no relief. PT states hx of kidney stones in the past. Pt complaining of chills.

## 2024-07-22 NOTE — Op Note (Signed)
 Operative Note  Preoperative diagnosis:  1.  Left ureteral calculus with sepsis secondary to urinary tract infection  Post operative diagnosis: 1.  Left ureteral calculus with sepsis secondary to urinary tract infection  Procedure(s): 1.  Cystoscopy with left retrograde pyelogram and left ureteral stent placement  Surgeon: Sherwood Edison, MD  Assistants: None  Anesthesia: General  Complications: None immediate  EBL: Minimal  Specimens: 1.  None  Drains/Catheters: 1.  6 X 24 double-J ureteral stent 2.  16 French Foley catheter  Intraoperative findings: 1.  Normal urethra and bladder supper some erythema of the bladder consistent with cystitis 2.  Left retrograde pyelogram revealed a filling defect at the level of the stone with minimal upstream hydroureteronephrosis  Indication: 63 year old female with sepsis secondary to a left ureteral stone and urinary tract infection presents for emergent left ureteral stent placement.  Description of procedure:  The patient was identified and consent was obtained.  The patient was taken to the operating room and placed in the supine position.  The patient was placed under general anesthesia.  Perioperative antibiotics were administered.  The patient was placed in dorsal lithotomy.  Patient was prepped and draped in a standard sterile fashion and a timeout was performed.  A 21 French rigid cystoscope was advanced into the urethra and into the bladder.  The left distal most portion of the ureter was cannulated with an open-ended ureteral catheter.  Retrograde pyelogram was performed with the findings noted above.  A sensor wire was then advanced up to the kidney under fluoroscopic guidance.  A 6 X 24 double-J ureteral stent was advanced up to the kidney under fluoroscopic guidance.  The wire was withdrawn and fluoroscopy confirmed good proximal placement and direct visualization confirmed a good coil within the bladder.  The bladder was drained and  the scope withdrawn.  Foley catheter was placed.  This concluded the operation.  Patient tolerated procedure well and was stable postoperatively.  Plan: Continue antibiotics.  Hospitalist has been consulted.  She will need a ureteroscopy in a couple of weeks or so after full course of antibiotics and recovery

## 2024-07-22 NOTE — ED Notes (Signed)
 Opened chart to review pt and potential needs upon arrival to [facility]

## 2024-07-22 NOTE — ED Notes (Signed)
 Pt requested K+ to run at 71meq/hr due to vascular tolerance

## 2024-07-22 NOTE — ED Notes (Signed)
 Care link called to transport patient. MD and nurse made aware.

## 2024-07-23 ENCOUNTER — Inpatient Hospital Stay (HOSPITAL_COMMUNITY)

## 2024-07-23 ENCOUNTER — Encounter (HOSPITAL_COMMUNITY): Payer: Self-pay | Admitting: Urology

## 2024-07-23 DIAGNOSIS — N390 Urinary tract infection, site not specified: Secondary | ICD-10-CM | POA: Diagnosis not present

## 2024-07-23 DIAGNOSIS — A419 Sepsis, unspecified organism: Secondary | ICD-10-CM | POA: Diagnosis not present

## 2024-07-23 LAB — BLOOD CULTURE ID PANEL (REFLEXED) - BCID2

## 2024-07-23 LAB — CBC
HCT: 38.4 % (ref 36.0–46.0)
Hemoglobin: 12.3 g/dL (ref 12.0–15.0)
MCH: 31.7 pg (ref 26.0–34.0)
MCHC: 32 g/dL (ref 30.0–36.0)
MCV: 99 fL (ref 80.0–100.0)
Platelets: 89 K/uL — ABNORMAL LOW (ref 150–400)
RBC: 3.88 MIL/uL (ref 3.87–5.11)
RDW: 13.6 % (ref 11.5–15.5)
WBC: 16.6 K/uL — ABNORMAL HIGH (ref 4.0–10.5)
nRBC: 0 % (ref 0.0–0.2)

## 2024-07-23 LAB — COMPREHENSIVE METABOLIC PANEL WITH GFR
ALT: 35 U/L (ref 0–44)
AST: 41 U/L (ref 15–41)
Albumin: 3 g/dL — ABNORMAL LOW (ref 3.5–5.0)
Alkaline Phosphatase: 76 U/L (ref 38–126)
Anion gap: 14 (ref 5–15)
BUN: 8 mg/dL (ref 8–23)
CO2: 14 mmol/L — ABNORMAL LOW (ref 22–32)
Calcium: 7.5 mg/dL — ABNORMAL LOW (ref 8.9–10.3)
Chloride: 116 mmol/L — ABNORMAL HIGH (ref 98–111)
Creatinine, Ser: 0.76 mg/dL (ref 0.44–1.00)
GFR, Estimated: >60 mL/min (ref >60)
Glucose, Bld: 79 mg/dL (ref 70–99)
Potassium: 2.8 mmol/L — ABNORMAL LOW (ref 3.5–5.1)
Sodium: 145 mmol/L (ref 135–145)
Total Bilirubin: 1.1 mg/dL (ref 0.0–1.2)
Total Protein: 5.3 g/dL — ABNORMAL LOW (ref 6.5–8.1)

## 2024-07-23 LAB — MRSA NEXT GEN BY PCR, NASAL: MRSA by PCR Next Gen: NOT DETECTED

## 2024-07-23 LAB — BASIC METABOLIC PANEL WITH GFR
Anion gap: 12 (ref 5–15)
BUN: 13 mg/dL (ref 8–23)
CO2: 12 mmol/L — ABNORMAL LOW (ref 22–32)
Calcium: 7.3 mg/dL — ABNORMAL LOW (ref 8.9–10.3)
Chloride: 118 mmol/L — ABNORMAL HIGH (ref 98–111)
Creatinine, Ser: 0.69 mg/dL (ref 0.44–1.00)
GFR, Estimated: >60 mL/min (ref >60)
Glucose, Bld: 90 mg/dL (ref 70–99)
Potassium: 4.6 mmol/L (ref 3.5–5.1)
Sodium: 142 mmol/L (ref 135–145)

## 2024-07-23 LAB — LACTIC ACID, PLASMA: Lactic Acid, Venous: 3.3 mmol/L (ref 0.5–1.9)

## 2024-07-23 MED ORDER — CARMEX CLASSIC LIP BALM EX OINT
1.0000 | TOPICAL_OINTMENT | CUTANEOUS | Status: DC | PRN
  Administered 2024-07-23: 1 via TOPICAL
  Filled 2024-07-23: qty 10

## 2024-07-23 MED ORDER — POTASSIUM CHLORIDE CRYS ER 20 MEQ PO TBCR
40.0000 meq | EXTENDED_RELEASE_TABLET | ORAL | Status: AC
Start: 2024-07-23 — End: 2024-07-23
  Administered 2024-07-23 (×2): 40 meq via ORAL
  Filled 2024-07-23 (×2): qty 2

## 2024-07-23 MED ORDER — MAGNESIUM SULFATE 2 GM/50ML IV SOLN
2.0000 g | Freq: Once | INTRAVENOUS | Status: AC
  Administered 2024-07-23: 2 g via INTRAVENOUS
  Filled 2024-07-23: qty 50

## 2024-07-23 MED ORDER — MORPHINE SULFATE (PF) 2 MG/ML IV SOLN
2.0000 mg | Freq: Once | INTRAVENOUS | Status: AC
  Administered 2024-07-23: 2 mg via INTRAVENOUS
  Filled 2024-07-23: qty 1

## 2024-07-23 MED ORDER — MORPHINE SULFATE (PF) 2 MG/ML IV SOLN
1.0000 mg | Freq: Once | INTRAVENOUS | Status: DC
  Filled 2024-07-23: qty 1

## 2024-07-23 MED ORDER — ONDANSETRON HCL 4 MG/2ML IJ SOLN
4.0000 mg | Freq: Four times a day (QID) | INTRAMUSCULAR | Status: DC | PRN
  Administered 2024-07-23 (×2): 4 mg via INTRAVENOUS
  Filled 2024-07-23 (×2): qty 2

## 2024-07-23 MED ORDER — SODIUM CHLORIDE 0.9 % IV SOLN
2.0000 g | INTRAVENOUS | Status: DC
  Administered 2024-07-23 – 2024-07-26 (×4): 2 g via INTRAVENOUS
  Filled 2024-07-23 (×4): qty 20

## 2024-07-23 MED ORDER — LACTATED RINGERS IV BOLUS
1000.0000 mL | Freq: Once | INTRAVENOUS | Status: AC
  Administered 2024-07-23: 1000 mL via INTRAVENOUS

## 2024-07-23 MED ORDER — POTASSIUM PHOSPHATES 15 MMOLE/5ML IV SOLN
30.0000 mmol | Freq: Once | INTRAVENOUS | Status: AC
  Administered 2024-07-23: 30 mmol via INTRAVENOUS
  Filled 2024-07-23: qty 10

## 2024-07-23 MED ORDER — SODIUM CHLORIDE 0.9 % IV SOLN: INTRAVENOUS | Status: DC

## 2024-07-23 MED ORDER — OXYCODONE HCL 5 MG PO TABS
5.0000 mg | ORAL_TABLET | ORAL | Status: DC | PRN
  Administered 2024-07-23 – 2024-07-26 (×16): 5 mg via ORAL
  Filled 2024-07-23 (×16): qty 1

## 2024-07-23 NOTE — Progress Notes (Signed)
 1 Day Post-Op Subjective: Called to the bedside due to patient report of severe epigastric pain.  Had resolved prior to my arrival.  No clear etiology on KUB  Objective: Vital signs in last 24 hours: Temp:  [98.3 F (36.8 C)-103.1 F (39.5 C)] 98.4 F (36.9 C) (10/08 0800) Pulse Rate:  [43-147] 89 (10/08 0900) Resp:  [11-31] 24 (10/08 0900) BP: (77-170)/(33-142) 157/59 (10/08 0900) SpO2:  [91 %-100 %] 100 % (10/08 0900) Weight:  [66 kg-68.6 kg] 68.6 kg (10/07 1635)  Assessment/Plan: # Left ureteral stone- 4mm # UTI  Patient remains on broad ABX while awaiting culture data.  Large uptick in leukocytosis.  0.9->16.6 Patient is afebrile this morning, lactic acid is improving, and renal function is preserved. To the OR for left ureteral stent placement with Dr. Carolee on 07/22/2024. Excellent UOP.  Unsure of the etiology of patient's gastric pain and shortness of breath this morning.  KUB shows stents to be in place.  She had marked tenderness when pressing just below the xiphoid process.  None of her complaints are local to her urinary system.  She is fine now and has not had a recurrence of her symptoms after a dose of morphine  about an hour ago.  She reports that she is familiar with renal colic and bladder spasm, and did not feel that either of those were the cause.  She does have as needed antispasmodics which we can trial.  Definitive stone management on an outpatient basis when she is cleared her infection.  Please call with questions.  Intake/Output from previous day: 10/07 0701 - 10/08 0700 In: 8279.2 [I.V.:1625.4; IV Piggyback:6643.8] Out: 1700 [Urine:1700]  Intake/Output this shift: Total I/O In: 1768.7 [I.V.:621.9; IV Piggyback:1146.8] Out: -   Physical Exam:  General: Alert and oriented CV: No cyanosis Lungs: equal chest rise Abdomen: Soft, NTND, no rebound or guarding Gu: Foley catheter in place draining cloudy yellow urine  Lab Results: Recent Labs     07/22/24 0907 07/23/24 0259  HGB 15.2* 12.3  HCT 45.4 38.4   BMET Recent Labs    07/22/24 0907 07/23/24 0259  NA 140 145  K 3.0* 2.8*  CL 107 116*  CO2 15* 14*  GLUCOSE 145* 79  BUN 7* 8  CREATININE 0.80 0.76  CALCIUM  9.4 7.5*  HGB 15.2* 12.3  WBC 0.9* 16.6*     Studies/Results: DG Abd 1 View Result Date: 07/23/2024 CLINICAL DATA:  10026 Shortness of breath 10026. EXAM: ABDOMEN - 1 VIEW COMPARISON:  07/12/2023. FINDINGS: The bowel gas pattern is non-obstructive. No evidence of pneumoperitoneum, within the limitations of a supine film. No acute osseous abnormalities. Left-sided ureteric stent noted. There is a 5 mm faint calcification overlying the left renal shadow region, which may represent renal calculus. The soft tissues are otherwise within normal limits. Surgical changes, devices, tubes and lines: There are multiple metallic anchors overlying the left side of the pelvis, likely from prior hernia repair. IMPRESSION: Nonobstructive bowel gas pattern. Electronically Signed   By: Ree Molt M.D.   On: 07/23/2024 09:40   DG CHEST PORT 1 VIEW Result Date: 07/23/2024 CLINICAL DATA:  10026 Shortness of breath 10026. EXAM: PORTABLE CHEST 1 VIEW COMPARISON:  03/19/2024. FINDINGS: There is persistent mild prominence of interstitial markings, which is nonspecific but essentially unchanged since the prior study. No frank pulmonary edema. There are probable atelectatic changes at the left lung base. Bilateral lung fields are otherwise clear. No acute consolidation or lung collapse. Bilateral costophrenic angles are  clear. Stable cardio-mediastinal silhouette. No acute osseous abnormalities. The soft tissues are within normal limits. IMPRESSION: No acute cardiopulmonary abnormality. Electronically Signed   By: Ree Molt M.D.   On: 07/23/2024 09:38   DG C-Arm 1-60 Min-No Report Result Date: 07/22/2024 Fluoroscopy was utilized by the requesting physician.  No radiographic  interpretation.   CT Renal Stone Study Result Date: 07/22/2024 CLINICAL DATA:  Left flank pain. EXAM: CT ABDOMEN AND PELVIS WITHOUT CONTRAST TECHNIQUE: Multidetector CT imaging of the abdomen and pelvis was performed following the standard protocol without IV contrast. RADIATION DOSE REDUCTION: This exam was performed according to the departmental dose-optimization program which includes automated exposure control, adjustment of the mA and/or kV according to patient size and/or use of iterative reconstruction technique. COMPARISON:  CTA abdomen pelvis dated 03/14/2024. FINDINGS: Lower chest: Similar linear scarring at the right lung base. Hepatobiliary: No suspicious focal hepatic lesion identified within the limits of an unenhanced exam. Status post cholecystectomy. No biliary dilatation. Pancreas: Unremarkable. No pancreatic ductal dilatation or surrounding inflammatory changes. Spleen: Normal in size without focal abnormality. Adrenals/Urinary Tract: 4 mm calculus in the left proximal ureter near the ureteropelvic junction with mild-to-moderate left hydronephrosis and perinephric stranding. 5 mm nonobstructive calculus at the inferior pole of the left kidney. No right-sided urolithiasis or hydronephrosis. Bladder is mildly distended and otherwise unremarkable. Adrenal glands are unremarkable. Stomach/Bowel: Stomach is within normal limits. Status post appendectomy. No obstruction. The colon is decompressed with wall thickening which may be secondary to underdistention or a mild nonspecific colitis. Sigmoid colonic diverticulosis without evidence of acute diverticulitis. Vascular/Lymphatic: Abdominal aorta is normal in caliber with atherosclerotic calcification. No enlarged abdominal or pelvic lymph nodes. Reproductive: Status post hysterectomy. No adnexal masses. Other: Surgical mesh repair of the lower anterior abdominal wall hernia. No evidence of recurrent hernia. No abdominopelvic ascites. No  intraperitoneal free air. Musculoskeletal: No acute osseous abnormality. No suspicious osseous lesion. IMPRESSION: 1. 4 mm calculus in the left proximal ureter near the ureteropelvic junction with mild-to-moderate left hydronephrosis and perinephric stranding. 2. Nonobstructive 5 mm calculus at the inferior pole of the left kidney. 3. The colon is decompressed with wall thickening which may be secondary to underdistention or a mild nonspecific colitis. 4. Sigmoid colonic diverticulosis without evidence of acute diverticulitis. 5.  Aortic Atherosclerosis (ICD10-I70.0). Electronically Signed   By: Harrietta Sherry M.D.   On: 07/22/2024 10:47      LOS: 1 day   Ole Bourdon, NP [facility] Pager: [phone]  07/23/2024, 11:02 AM

## 2024-07-23 NOTE — Hospital Course (Signed)
 63yo with h/o anxiety/depression, COPD, CVA, fibromyalgia, RA, OSA on CPAP, and HLD who presented on 10/7 with flank pain.  UA suggestive of UTI, CT with 4mm L UPJ calculus with mild to moderate hydronephrosis. BCID + E coli.  Given Ceftriaxone.  Underwent cystoscopy on 10/7 with stent placement.

## 2024-07-23 NOTE — Progress Notes (Signed)
 Progress Note   Patient: [name] FMW:[id number] DOB: December 03, 1960 DOA: 07/22/2024     1 DOS: the patient was seen and examined on 07/23/2024   Brief hospital course: 63yo with h/o anxiety/depression, COPD, CVA, fibromyalgia, RA, OSA on CPAP, and HLD who presented on 10/7 with flank pain.  UA suggestive of UTI, CT with 4mm L UPJ calculus with mild to moderate hydronephrosis. BCID + E coli.  Given Ceftriaxone.  Underwent cystoscopy on 10/7 with stent placement.  Assessment and Plan:  Sepsis secondary to E coli UTI with obstructing nephrolithiasis Presented with fever, tachycardia, lactate elevation Admitted to SDU/inpatient Continue IV fluids Analgesics/antiemetics as needed Cefepime -> ceftriaxone Urine culture not sent Blood cultures pending  Nephrolithiasis S/p cystoscopy 10/7 Plan for ureteroscopy in a couple of weeks after fully treated with antibiotics   Non-anion gap metabolic acidosis Anticipate that this is related to severe infection Continue to provide IVF hydration  Hypokalemia/hypomagnesemia/hypophosphatemia Repleting Will continue to follow   Obstructive sleep apnea Continue CPAP at bedtime   Allergic asthma, moderate persistent, uncomplicated/COPD (chronic obstructive pulmonary disease)  Bronchodilators as needed Supplemental oxygen as needed   Complex partial epilepsy Continue Topamax     Elevated liver enzymes Likely related to sepsis, now resolved Continue ursodiol    Gastritis and gastroduodenitis Continue pantoprazole     Chronic idiopathic constipation Continue Amitiza     Hyperlipidemia  Continue pravastatin        Consultants: Urology  Procedures: Cystoscopy 10/7  Antibiotics: Cefepime 10/7-8 Ceftriaxone 10/8-  30 Day Unplanned Readmission Risk Score    Flowsheet Row ED to Hosp-Admission (Current[facility]  30 Day Unplanned Readmission Risk Score (%) 18.31 Filed at  07/23/2024 0400    This score is the patient's risk of an unplanned readmission within 30 days of being discharged (0 -100%). The score is based on dignosis, age, lab data, medications, orders, and past utilization.   Low:  0-14.9   Medium: 15-21.9   High: 22-29.9   Extreme: 30 and above           Subjective: Severe abdominal and R flank pain this AM with SOB.  Appears to have improved.   Objective: Vitals:   07/23/24 1400 07/23/24 1500  BP: 135/66 (!) 104/58  Pulse: 91 81  Resp: (!) 23 (!) 21  Temp:    SpO2: 97% 94%    Intake/Output Summary (Last 24 hours) at 07/23/2024 1600 Last data filed at 07/23/2024 1500 Gross per 24 hour  Intake 8493.78 ml  Output 1300 ml  Net 7193.78 ml   Filed Weights   07/22/24 0901 07/22/24 1401 07/22/24 1635  Weight: 66.5 kg 66 kg 68.6 kg    Exam:  General:  Appears calm but uncomfortable, significant discomfort this AM (now improved) Eyes:  normal lids, iris ENT:  grossly normal hearing, lips & tongue, mmm Cardiovascular:  RRR. No LE edema.  Respiratory:   CTA bilaterally with no wheezes/rales/rhonchi.  Normal respiratory effort. Abdomen:  soft, epigastric TTP, ND; R flank pain Skin:  no rash or induration seen on limited exam Musculoskeletal:  grossly normal tone BUE/BLE, good ROM, no bony abnormality Psychiatric:  blunted mood and affect, speech fluent and appropriate, AOx3 Neurologic:  CN 2-12 grossly intact, moves all extremities in coordinated fashion  Data Reviewed: I have reviewed the patient's lab results since admission.  Pertinent labs for today include:   K+ 2.8 CO2 14 Phos 2.4 Mag++ 1.5 Albumin 3.0 Lactate 5.8 x 3, 3.3 WBC 16.6 Platelets 89  BCID + E coli    Family Communication: None present      Code Status: Full Code   Disposition: Status is: Inpatient Remains inpatient appropriate because: ongoing management     Time spent: 50 minutes  Unresulted Labs (From admission, onward)     Start      Ordered   07/24/24 0500  CBC with Differential/Platelet  Tomorrow morning,   R       Question:  Specimen collection method  Answer:  Lab=Lab collect   07/23/24 1559   07/24/24 0500  Basic metabolic panel with GFR  Tomorrow morning,   R       Question:  Specimen collection method  Answer:  Lab=Lab collect   07/23/24 1559   Unscheduled  Basic metabolic panel  Once,   R       Question:  Specimen collection method  Answer:  Lab=Lab collect   07/23/24 1600             Author: Delon Herald, MD 07/23/2024 4:00 PM  For on call review [email or web address].

## 2024-07-23 NOTE — Plan of Care (Signed)

## 2024-07-23 NOTE — Progress Notes (Signed)
 PHARMACY - PHYSICIAN COMMUNICATION CRITICAL VALUE ALERT - BLOOD CULTURE IDENTIFICATION (BCID)  [name] is an 63 y.o. female who presented to [facility] on 07/22/2024 with sepsis due to UTI.  S/P cystoscopy with stent placement on 10/7  Assessment:  BCID + E.Coli (no ESBL) in 3 out of 4 bottles   Name of physician (or Provider) Contacted: Lynwood Kipper, NP  Current antibiotics: Cefepime  Changes to prescribed antibiotics recommended:  Continue with Cefepime for now => allow day [facility] team and urology to determine if appropriate for de-escalation due to recent neutropenia concern  Results for orders placed or performed during the hospital encounter of 07/22/24  Blood Culture ID Panel (Reflexed) (Collected: 07/22/2024 11:34 AM)  Result Value Ref Range   Enterococcus faecalis NOT DETECTED NOT DETECTED   Enterococcus Faecium NOT DETECTED NOT DETECTED   Listeria monocytogenes NOT DETECTED NOT DETECTED   Staphylococcus species NOT DETECTED NOT DETECTED   Staphylococcus aureus (BCID) NOT DETECTED NOT DETECTED   Staphylococcus epidermidis NOT DETECTED NOT DETECTED   Staphylococcus lugdunensis NOT DETECTED NOT DETECTED   Streptococcus species NOT DETECTED NOT DETECTED   Streptococcus agalactiae NOT DETECTED NOT DETECTED   Streptococcus pneumoniae NOT DETECTED NOT DETECTED   Streptococcus pyogenes NOT DETECTED NOT DETECTED   A.calcoaceticus-baumannii NOT DETECTED NOT DETECTED   Bacteroides fragilis NOT DETECTED NOT DETECTED   Enterobacterales DETECTED (A) NOT DETECTED   Enterobacter cloacae complex NOT DETECTED NOT DETECTED   Escherichia coli DETECTED (A) NOT DETECTED   Klebsiella aerogenes NOT DETECTED NOT DETECTED   Klebsiella oxytoca NOT DETECTED NOT DETECTED   Klebsiella pneumoniae NOT DETECTED NOT DETECTED   Proteus species NOT DETECTED NOT DETECTED   Salmonella species NOT DETECTED NOT DETECTED   Serratia marcescens NOT DETECTED NOT DETECTED   Haemophilus influenzae NOT  DETECTED NOT DETECTED   Neisseria meningitidis NOT DETECTED NOT DETECTED   Pseudomonas aeruginosa NOT DETECTED NOT DETECTED   Stenotrophomonas maltophilia NOT DETECTED NOT DETECTED   Candida albicans NOT DETECTED NOT DETECTED   Candida auris NOT DETECTED NOT DETECTED   Candida glabrata NOT DETECTED NOT DETECTED   Candida krusei NOT DETECTED NOT DETECTED   Candida parapsilosis NOT DETECTED NOT DETECTED   Candida tropicalis NOT DETECTED NOT DETECTED   Cryptococcus neoformans/gattii NOT DETECTED NOT DETECTED   CTX-M ESBL NOT DETECTED NOT DETECTED   Carbapenem resistance IMP NOT DETECTED NOT DETECTED   Carbapenem resistance KPC NOT DETECTED NOT DETECTED   Carbapenem resistance NDM NOT DETECTED NOT DETECTED   Carbapenem resist OXA 48 LIKE NOT DETECTED NOT DETECTED   Carbapenem resistance VIM NOT DETECTED NOT DETECTED    Arvin Gauss, PharmD 07/23/2024  5:12 AM

## 2024-07-24 DIAGNOSIS — A419 Sepsis, unspecified organism: Secondary | ICD-10-CM | POA: Diagnosis not present

## 2024-07-24 DIAGNOSIS — N390 Urinary tract infection, site not specified: Secondary | ICD-10-CM | POA: Diagnosis not present

## 2024-07-24 LAB — CBC WITH DIFFERENTIAL/PLATELET
Band Neutrophils: 6 %
Basophils Absolute: 0.1 K/uL (ref 0.0–0.1)
Basophils Relative: 1 %
Eosinophils Absolute: 0 K/uL (ref 0.0–0.5)
Eosinophils Relative: 0 %
HCT: 33.1 % — ABNORMAL LOW (ref 36.0–46.0)
Hemoglobin: 10.8 g/dL — ABNORMAL LOW (ref 12.0–15.0)
Lymphocytes Relative: 5 %
Lymphs Abs: 0.5 K/uL — ABNORMAL LOW (ref 0.7–4.0)
MCH: 31.9 pg (ref 26.0–34.0)
MCHC: 32.6 g/dL (ref 30.0–36.0)
MCV: 97.6 fL (ref 80.0–100.0)
Monocytes Absolute: 0.1 K/uL (ref 0.1–1.0)
Monocytes Relative: 1 %
Neutro Abs: 9.7 K/uL — ABNORMAL HIGH (ref 1.7–7.7)
Neutrophils Relative %: 87 %
Platelets: 81 K/uL — ABNORMAL LOW (ref 150–400)
RBC: 3.39 MIL/uL — ABNORMAL LOW (ref 3.87–5.11)
RDW: 14 % (ref 11.5–15.5)
WBC: 10.4 K/uL (ref 4.0–10.5)
nRBC: 0 % (ref 0.0–0.2)

## 2024-07-24 LAB — SEDIMENTATION RATE: Sed Rate: 20 mm/h (ref 0–22)

## 2024-07-24 LAB — BASIC METABOLIC PANEL WITH GFR
Anion gap: 9 (ref 5–15)
BUN: 12 mg/dL (ref 8–23)
CO2: 15 mmol/L — ABNORMAL LOW (ref 22–32)
Calcium: 7 mg/dL — ABNORMAL LOW (ref 8.9–10.3)
Chloride: 117 mmol/L — ABNORMAL HIGH (ref 98–111)
Creatinine, Ser: 0.53 mg/dL (ref 0.44–1.00)
GFR, Estimated: >60 mL/min (ref >60)
Glucose, Bld: 78 mg/dL (ref 70–99)
Potassium: 3.3 mmol/L — ABNORMAL LOW (ref 3.5–5.1)
Sodium: 141 mmol/L (ref 135–145)

## 2024-07-24 LAB — C-REACTIVE PROTEIN: CRP: 19.4 mg/dL — ABNORMAL HIGH (ref <1.0)

## 2024-07-24 MED ORDER — METHYLPREDNISOLONE SODIUM SUCC 40 MG IJ SOLR
40.0000 mg | Freq: Two times a day (BID) | INTRAMUSCULAR | Status: DC
  Administered 2024-07-24 – 2024-07-26 (×5): 40 mg via INTRAVENOUS
  Filled 2024-07-24 (×5): qty 1

## 2024-07-24 MED ORDER — POTASSIUM CHLORIDE CRYS ER 20 MEQ PO TBCR
40.0000 meq | EXTENDED_RELEASE_TABLET | Freq: Once | ORAL | Status: AC
Start: 2024-07-24 — End: 2024-07-24
  Administered 2024-07-24: 40 meq via ORAL
  Filled 2024-07-24: qty 2

## 2024-07-24 NOTE — Progress Notes (Addendum)
 Progress Note   Patient: [name] FMW:[id number] DOB: 14-Aug-1961 DOA: 07/22/2024     2 DOS: the patient was seen and examined on 07/24/2024   Brief hospital course: 63yo with h/o anxiety/depression, COPD, CVA, fibromyalgia, RA, OSA on CPAP, and HLD who presented on 10/7 with flank pain.  UA suggestive of UTI, CT with 4mm L UPJ calculus with mild to moderate hydronephrosis. BCID + E coli.  Given Ceftriaxone.  Underwent cystoscopy on 10/7 with stent placement.  Assessment and Plan:  Severe sepsis secondary to E coli UTI/bacteremia with obstructing nephrolithiasis Presented with fever, tachycardia, lactate elevation Admitted to SDU/inpatient Continue IV fluids Analgesics/antiemetics as needed Cefepime -> ceftriaxone Urine culture not sent Blood cultures positive for E coli, sensitivities pending  B shoulder pain Reports BUE weakness and pain Appears to be MSK-related with paraspinous neck muscle TTP She has h/o RA and fibromyalgia Normal ESR Will give solumedrol 40 mg BID x 3 days and monitor for improvement Continue Zanaflex   Nephrolithiasis S/p cystoscopy 10/7 Plan for ureteroscopy in a couple of weeks after fully treated with antibiotics   Non-anion gap metabolic acidosis Anticipate that this is related to severe infection Continue to provide IVF hydration   Hypokalemia/hypomagnesemia/hypophosphatemia Repleting Will continue to follow Ionized calcium  ordered   Obstructive sleep apnea Continue CPAP at bedtime   Allergic asthma, moderate persistent, uncomplicated/COPD (chronic obstructive pulmonary disease)  Bronchodilators as needed Supplemental oxygen as needed   Complex partial epilepsy Continue Topamax     Elevated liver enzymes Likely related to sepsis, now resolved Continue ursodiol    Gastritis and gastroduodenitis Continue pantoprazole     Chronic idiopathic constipation Continue Amitiza     Hyperlipidemia  Continue pravastatin              Consultants: Urology   Procedures: Cystoscopy 10/7   Antibiotics: Cefepime 10/7-8 Ceftriaxone 10/8-  30 Day Unplanned Readmission Risk Score    Flowsheet Row ED to Hosp-Admission (Current) from [facility]22[facility]20[facility]  30 Day Unplanned Readmission Risk Score (%) 23.54 Filed at 07/24/2024 0400    This score is the patient's risk of an unplanned readmission within 30 days of being discharged (0 -100%). The score is based on dignosis, age, lab data, medications, orders, and past utilization.   Low:  0-14.9   Medium: 15-21.9   High: 22-29.9   Extreme: 30 and above           Subjective: Primary complaint is B shoulder pain and R > L UE weakness.  Some neck tightness with extension more than flexion.   Objective: Vitals:   07/24/24 1600 07/24/24 1630  BP: (!) 163/83   Pulse: 75   Resp: (!) 22   Temp:  98.4 F (36.9 C)  SpO2: 95%     Intake/Output Summary (Last 24 hours) at 07/24/2024 1803 Last data filed at 07/24/2024 1400 Gross per 24 hour  Intake 1125.08 ml  Output 600 ml  Net 525.08 ml   Filed Weights   07/22/24 0901 07/22/24 1401 07/22/24 1635  Weight: 66.5 kg 66 kg 68.6 kg    Exam:  General:  Appears calm but uncomfortable with BUE weakness and neck TTP Eyes:  normal lids, iris ENT:  grossly normal hearing, lips & tongue, mmm Cardiovascular:  RRR. No LE edema.  Respiratory:   CTA bilaterally with no wheezes/rales/rhonchi.  Normal respiratory effort. Abdomen:  soft, NT, ND; no flank pain Skin:  no rash or induration seen on limited exam Musculoskeletal:  grossly normal tone BUE/BLE, good  ROM, no bony abnormality Psychiatric:  blunted mood and affect, speech fluent and appropriate, AOx3 Neurologic:  CN 2-12 grossly intact, moves all extremities in coordinated fashion  Data Reviewed: I have reviewed the patient's lab results since admission.  Pertinent labs for today include:   K+ 3.3 Glucose 117 CO2 15, improved WBC  10.4 Hgb 10.8 Platelets 81     Family Communication: None present  Mobility: PT/OT Consulted     Code Status: Full Code    Disposition: Status is: Inpatient Remains inpatient appropriate because: ongoing management     Time spent: 50 minutes  Unresulted Labs (From admission, onward)     Start     Ordered   07/25/24 0500  Basic metabolic panel with GFR  Tomorrow morning,   R       Question:  Specimen collection method  Answer:  Lab=Lab collect   07/24/24 0756   07/25/24 0500  CBC with Differential/Platelet  Tomorrow morning,   R       Question:  Specimen collection method  Answer:  Lab=Lab collect   07/24/24 0756   07/24/24 1352  Calcium , ionized  ONCE - URGENT,   URGENT       Question:  Specimen collection method  Answer:  Lab=Lab collect   07/24/24 1351   07/24/24 1350  C-reactive protein  Once,   R       Question:  Specimen collection method  Answer:  Lab=Lab collect   07/24/24 1349             Author: Delon Herald, MD 07/24/2024 6:03 PM  For on call review [email or web address].

## 2024-07-24 NOTE — Progress Notes (Signed)
   07/24/24 2118  BiPAP/CPAP/SIPAP  BiPAP/CPAP/SIPAP Pt Type Adult  Reason BIPAP/CPAP not in use Non-compliant  BiPAP/CPAP /SiPAP Vitals  Pulse Rate (!) 59  Resp (!) 27  SpO2 96 %  MEWS Score/Color  MEWS Score 2  MEWS Score Color Yellow

## 2024-07-24 NOTE — Progress Notes (Signed)
   07/24/24 1515  TOC Brief Assessment  Insurance and Status Reviewed  Patient has primary care physician Yes  Home environment has been reviewed Single family home  Prior level of function: Independent  Prior/Current Home Services No current home services  Social Drivers of Health Review SDOH reviewed no interventions necessary  Readmission risk has been reviewed Yes  Transition of care needs transition of care needs identified, TOC will continue to follow

## 2024-07-24 NOTE — Plan of Care (Signed)

## 2024-07-25 DIAGNOSIS — A419 Sepsis, unspecified organism: Secondary | ICD-10-CM | POA: Diagnosis not present

## 2024-07-25 DIAGNOSIS — N390 Urinary tract infection, site not specified: Secondary | ICD-10-CM | POA: Diagnosis not present

## 2024-07-25 LAB — CBC WITH DIFFERENTIAL/PLATELET
Band Neutrophils: 16 %
Basophils Absolute: 0 K/uL (ref 0.0–0.1)
Basophils Relative: 0 %
Eosinophils Absolute: 0 K/uL (ref 0.0–0.5)
Eosinophils Relative: 0 %
HCT: 39.3 % (ref 36.0–46.0)
Hemoglobin: 12.8 g/dL (ref 12.0–15.0)
Lymphocytes Relative: 5 %
Lymphs Abs: 0.7 K/uL (ref 0.7–4.0)
MCH: 31.8 pg (ref 26.0–34.0)
MCHC: 32.6 g/dL (ref 30.0–36.0)
MCV: 97.5 fL (ref 80.0–100.0)
Monocytes Absolute: 0.4 K/uL (ref 0.1–1.0)
Monocytes Relative: 3 %
Neutro Abs: 12.9 K/uL — ABNORMAL HIGH (ref 1.7–7.7)
Neutrophils Relative %: 76 %
Platelets: 105 K/uL — ABNORMAL LOW (ref 150–400)
RBC: 4.03 MIL/uL (ref 3.87–5.11)
RDW: 13.6 % (ref 11.5–15.5)
WBC: 14 K/uL — ABNORMAL HIGH (ref 4.0–10.5)
nRBC: 0 % (ref 0.0–0.2)

## 2024-07-25 LAB — CULTURE, BLOOD (ROUTINE X 2)

## 2024-07-25 LAB — BASIC METABOLIC PANEL WITH GFR
Anion gap: 11 (ref 5–15)
BUN: 12 mg/dL (ref 8–23)
CO2: 16 mmol/L — ABNORMAL LOW (ref 22–32)
Calcium: 8.3 mg/dL — ABNORMAL LOW (ref 8.9–10.3)
Chloride: 115 mmol/L — ABNORMAL HIGH (ref 98–111)
Creatinine, Ser: 0.44 mg/dL (ref 0.44–1.00)
GFR, Estimated: >60 mL/min (ref >60)
Glucose, Bld: 132 mg/dL — ABNORMAL HIGH (ref 70–99)
Potassium: 3.9 mmol/L (ref 3.5–5.1)
Sodium: 142 mmol/L (ref 135–145)

## 2024-07-25 LAB — CALCIUM, IONIZED: Calcium, Ionized, Serum: 4.4 mg/dL — ABNORMAL LOW (ref 4.5–5.6)

## 2024-07-25 MED ORDER — CEFTRIAXONE IV (FOR PTA / DISCHARGE USE ONLY): 2.0000 g | INTRAVENOUS | 0 refills | Status: AC

## 2024-07-25 MED ORDER — SODIUM CHLORIDE 0.9% FLUSH: 10.0000 mL | INTRAVENOUS | Status: DC | PRN

## 2024-07-25 MED ORDER — MORPHINE SULFATE (PF) 2 MG/ML IV SOLN
2.0000 mg | Freq: Once | INTRAVENOUS | Status: AC
  Administered 2024-07-25: 2 mg via INTRAVENOUS
  Filled 2024-07-25: qty 1

## 2024-07-25 MED ORDER — SODIUM BICARBONATE 650 MG PO TABS
650.0000 mg | ORAL_TABLET | Freq: Two times a day (BID) | ORAL | Status: DC
  Administered 2024-07-25 – 2024-07-26 (×2): 650 mg via ORAL
  Filled 2024-07-25 (×2): qty 1

## 2024-07-25 MED ORDER — SODIUM CHLORIDE 0.9% FLUSH
10.0000 mL | Freq: Two times a day (BID) | INTRAVENOUS | Status: DC
  Administered 2024-07-25 – 2024-07-26 (×2): 10 mL

## 2024-07-25 MED ORDER — AMLODIPINE BESYLATE 5 MG PO TABS
5.0000 mg | ORAL_TABLET | Freq: Every day | ORAL | Status: DC
  Administered 2024-07-25 – 2024-07-26 (×2): 5 mg via ORAL
  Filled 2024-07-25 (×2): qty 1

## 2024-07-25 MED ORDER — HYDRALAZINE HCL 20 MG/ML IJ SOLN: 5.0000 mg | INTRAMUSCULAR | Status: DC | PRN

## 2024-07-25 NOTE — Evaluation (Signed)
 Physical Therapy Evaluation Patient Details Name: [name] MRN: [id number] DOB: 11-21-1960 Today's Date: 07/25/2024  History of Present Illness  Patient is a 63 year old female who presented to the ED  on 07/22/2024 with c/o flank pain along with fever, chills, and N/V.  Dx with left renal colic in setting of nephrolithiasis and sepsis secondary to UTI.  Patient underwent cystoscopy with left retrograde pyelogram and left ureteral stent placement. Pt PMH Includes but is not limited to: anxiety, asthma, CVA, COPD, depression, fibromyalgia, diverticulitis, GERD, HOH, kidney stones, HLD, IBS, arthritis/RA, OSA, B TKA and B shoulder dysfunction.  Clinical Impression    Pt admitted with above diagnosis.  Pt currently with functional limitations due to the deficits listed below (see PT Problem List). Pt in bed when PT arrived. Pt agreeable to therapy intervention. Pt indicates chronic B UE pain due to arthritis/RA. Pt is motivated to get OOB and participate with therapy. Pt denies dizziness or SOB with functional mobility tasks. Pt reports no AD at baseline and declining use of RW at this time due to B UE pain, pt exhibited occasional LOB with min A to recover with gait tasks no AD with CGA in hallway 180 feet, gait tasks in personal room bed to bathroom 18 feet with pt reaching for items/furniture walking, pt is S for supine to sit, CGA for sit to stand. Pt left seated in recliner, all needs in place.   Pt will benefit from acute skilled PT to increase their independence and safety with mobility to allow discharge.         If plan is discharge home, recommend the following: A little help with walking and/or transfers;A little help with bathing/dressing/bathroom;Assistance with cooking/housework   Can travel by private vehicle        Equipment Recommendations None recommended by PT  Recommendations for Other Services       Functional Status Assessment Patient has had a recent decline in their  functional status and demonstrates the ability to make significant improvements in function in a reasonable and predictable amount of time.     Precautions / Restrictions Precautions Precautions: Fall Restrictions Weight Bearing Restrictions Per Provider Order: No      Mobility  Bed Mobility Overal bed mobility: Needs Assistance Bed Mobility: Supine to Sit     Supine to sit: Supervision     General bed mobility comments: min cues, increased time and use of hospital bed    Transfers Overall transfer level: Needs assistance Equipment used: None Transfers: Sit to/from Stand Sit to Stand: Contact guard assist           General transfer comment: min cues, pt reports unable to use RW due to B shoulder pain and dysfunction    Ambulation/Gait Ambulation/Gait assistance: Contact guard assist Gait Distance (Feet): 18 Feet (and an additional 180 feet) Assistive device: None Gait Pattern/deviations: Step-through pattern, Staggering right, Staggering left Gait velocity: decreased     General Gait Details: furniture walking, with min cues, mild LOB noted  Stairs            Wheelchair Mobility     Tilt Bed    Modified Rankin (Stroke Patients Only)       Balance Overall balance assessment: Mild deficits observed, not formally tested (more pronounced with prolonged gait tasks)  Pertinent Vitals/Pain Pain Assessment Pain Assessment: 0-10 Pain Score: 8  Pain Location: B shoulders Pain Descriptors / Indicators: Aching, Constant, Discomfort, Dull, Grimacing, Guarding Pain Intervention(s): Limited activity within patient's tolerance, Monitored during session, Premedicated before session, Repositioned    Home Living Family/patient expects to be discharged to:: Private residence Living Arrangements: Spouse/significant other Available Help at Discharge: Family Type of Home: Mobile home Home Access:  Stairs to enter Entrance Stairs-Rails: None Entrance Stairs-Number of Steps: 2   Home Layout: One level Home Equipment: Agricultural consultant (2 wheels);Shower seat;Grab bars - tub/shower      Prior Function Prior Level of Function : Independent/Modified Independent;Driving             Mobility Comments: IND no AD for all ADLs, self care task and IADLs.       Extremity/Trunk Assessment   Upper Extremity Assessment Upper Extremity Assessment: Defer to OT evaluation    Lower Extremity Assessment Lower Extremity Assessment: Generalized weakness    Cervical / Trunk Assessment Cervical / Trunk Assessment: Normal  Communication   Communication Communication: No apparent difficulties    Cognition Arousal: Alert Behavior During Therapy: WFL for tasks assessed/performed   PT - Cognitive impairments: No apparent impairments                         Following commands: Intact       Cueing       General Comments General comments (skin integrity, edema, etc.): on RA and vital signs WFL during eval    Exercises     Assessment/Plan    PT Assessment Patient needs continued PT services  PT Problem List Decreased strength;Decreased activity tolerance;Decreased balance;Pain       PT Treatment Interventions Gait training;Stair training;Functional mobility training;Therapeutic activities;Therapeutic exercise;Balance training;Neuromuscular re-education;Patient/family education    PT Goals (Current goals can be found in the Care Plan section)  Acute Rehab PT Goals Patient Stated Goal: to be able to go home on sunday PT Goal Formulation: With patient Time For Goal Achievement: 08/08/24 Potential to Achieve Goals: Good    Frequency Min 3X/week     Co-evaluation PT/OT/SLP Co-Evaluation/Treatment: Yes Reason for Co-Treatment: Complexity of the patient's impairments (multi-system involvement);Necessary to address cognition/behavior during functional activity;For  patient/therapist safety PT goals addressed during session: Mobility/safety with mobility;Balance;Proper use of DME OT goals addressed during session: ADL's and self-care;Proper use of Adaptive equipment and DME       AM-PAC PT 6 Clicks Mobility  Outcome Measure Help needed turning from your back to your side while in a flat bed without using bedrails?: None Help needed moving from lying on your back to sitting on the side of a flat bed without using bedrails?: A Little Help needed moving to and from a bed to a chair (including a wheelchair)?: A Little Help needed standing up from a chair using your arms (e.g., wheelchair or bedside chair)?: A Little Help needed to walk in hospital room?: A Little Help needed climbing 3-5 steps with a railing? : A Lot 6 Click Score: 18    End of Session Equipment Utilized During Treatment: Gait belt Activity Tolerance: Patient tolerated treatment well;No increased pain Patient left: in chair;with call bell/phone within reach;with chair alarm set Nurse Communication: Mobility status PT Visit Diagnosis: Unsteadiness on feet (R26.81);Other abnormalities of gait and mobility (R26.89);Pain    Time: 8860-8785 PT Time Calculation (min) (ACUTE ONLY): 35 min   Charges:   PT Evaluation $PT Eval Low Complexity: 1  Low PT Treatments $Gait Training: 8-22 mins PT General Charges $$ ACUTE PT VISIT: 1 Visit         [place], PT Acute Rehab   [place] VEAR Drone 07/25/2024, 1:04 PM

## 2024-07-25 NOTE — Progress Notes (Signed)
   3 Days Post-Op Subjective: ABD pain much improved. Unclear etiology, presumably gastritis. Working with PT on rounds  Objective: Vital signs in last 24 hours: Temp:  [97.5 F (36.4 C)-98.4 F (36.9 C)] 97.5 F (36.4 C) (10/10 1204) Pulse Rate:  [48-82] 57 (10/10 1000) Resp:  [12-31] 26 (10/10 1000) BP: (113-169)/(65-102) 145/77 (10/10 1000) SpO2:  [94 %-100 %] 98 % (10/10 1000)  Assessment/Plan: # Left ureteral stone- 4mm # UTI  E.Coli UTI. Continue 1 week ABX. Don't see sensitivities.   No sign of systemic infection.   To the OR for left ureteral stent placement with Dr. Carolee on 07/22/2024.  Excellent UOP.  Remove foley this am.   Definitive stone management on an outpatient basis when she is cleared her infection. Ok for discharge once medically cleared.  Please call with questions.  Intake/Output from previous day: 10/09 0701 - 10/10 0700 In: 530.8 [I.V.:447.1; IV Piggyback:83.7] Out: 1700 [Urine:1700]  Intake/Output this shift: Total I/O In: 240 [P.O.:240] Out: -   Physical Exam:  General: Alert and oriented CV: No cyanosis Lungs: equal chest rise Abdomen: Soft, NTND, no rebound or guarding Gu: Foley catheter in place draining cloudy yellow urine  Lab Results: Recent Labs    07/23/24 0259 07/24/24 0253 07/25/24 0307  HGB 12.3 10.8* 12.8  HCT 38.4 33.1* 39.3   BMET Recent Labs    07/24/24 0253 07/25/24 0307  NA 141 142  K 3.3* 3.9  CL 117* 115*  CO2 15* 16*  GLUCOSE 78 132*  BUN 12 12  CREATININE 0.53 0.44  CALCIUM  7.0* 8.3*  HGB 10.8* 12.8  WBC 10.4 14.0*     Studies/Results: No results found.     LOS: 3 days   Ole Bourdon, NP [facility] Pager: [phone]  07/25/2024, 12:07 PM

## 2024-07-25 NOTE — Progress Notes (Signed)
 Progress Note   Patient: [name] FMW:[id number] DOB: [birth year] DOA: 07/22/2024     3 DOS: the patient was seen and examined on 07/25/2024   Brief hospital course: 63yo with h/o anxiety/depression, COPD, CVA, fibromyalgia, RA, OSA on CPAP, and HLD who presented on 10/7 with flank pain.  UA suggestive of UTI, CT with 4mm L UPJ calculus with mild to moderate hydronephrosis. BCID + E coli.  Given Ceftriaxone.  Underwent cystoscopy on 10/7 with stent placement.  Assessment and Plan:  Severe sepsis secondary to E coli UTI/bacteremia with obstructing nephrolithiasis Presented with fever, tachycardia, lactate elevation Admitted to SDU/inpatient Continue IV fluids Analgesics/antiemetics as needed Cefepime -> ceftriaxone Urine culture not sent Blood cultures positive for E coli, sensitivities pending   B shoulder pain Reports BUE weakness and pain Appears to be MSK-related with paraspinous neck muscle TTP She has h/o RA and fibromyalgia Normal ESR but significantly elevated CRP Giving solumedrol 40 mg BID x 3 days Reports improvement today, although still not close to back to baseline Continue Zanaflex PT/OT consulted, no follow up is needed   Nephrolithiasis S/p cystoscopy 10/7 Plan for ureteroscopy in a couple of weeks after fully treated with antibiotics   Non-anion gap metabolic acidosis Anticipate that this is related to severe infection Continue to provide IVF hydration Will add HCO3 x 3 days   Hypokalemia/hypomagnesemia/hypophosphatemia Repleting Will continue to follow   Obstructive sleep apnea Continue CPAP at bedtime   Allergic asthma, moderate persistent, uncomplicated/COPD (chronic obstructive pulmonary disease)  Bronchodilators as needed Supplemental oxygen as needed   Complex partial epilepsy Continue Topamax     Elevated liver enzymes Likely related to sepsis, now resolved Continue ursodiol    Gastritis and gastroduodenitis Continue pantoprazole      Chronic idiopathic constipation Continue Amitiza     Hyperlipidemia  Continue pravastatin    HTN Uncontrolled Resume amlodipine   Add prn IV hydralazine          Consultants: Urology   Procedures: Cystoscopy 10/7   Antibiotics: Cefepime 10/7-8 Ceftriaxone 10/8-  30 Day Unplanned Readmission Risk Score    Flowsheet Row ED to Hosp-Admission (Current) from 07/22/2024 in [facility]-ICU/STEPDOWN  30 Day Unplanned Readmission Risk Score (%) 25.32 Filed at 07/25/2024 0400    This score is the patient's risk of an unplanned readmission within 30 days of being discharged (0 -100%). The score is based on dignosis, age, lab data, medications, orders, and past utilization.   Low:  0-14.9   Medium: 15-21.9   High: 22-29.9   Extreme: 30 and above           Subjective: Feeling better.  Improving R shoulder girdle weakness, L is essentially back to baseline.  No c/o urinary symptoms.   Objective: Vitals:   07/25/24 1204 07/25/24 1400  BP:  (!) 180/104  Pulse:  72  Resp:  19  Temp: (!) 97.5 F (36.4 C)   SpO2:  96%    Intake/Output Summary (Last 24 hours) at 07/25/2024 1610 Last data filed at 07/25/2024 1400 Gross per 24 hour  Intake 379.29 ml  Output 1700 ml  Net -1320.71 ml   Filed Weights   07/22/24 0901 07/22/24 1401 07/22/24 1635  Weight: 66.5 kg 66 kg 68.6 kg    Exam:  General:  Appears calm and comfortable with improving BUE weakness and neck TTP Eyes:  normal lids, iris ENT:  grossly normal hearing, lips & tongue, mmm Cardiovascular:  RRR. No LE edema.  Respiratory:   CTA bilaterally with no wheezes/rales/rhonchi.  Normal respiratory effort. Abdomen:  soft, NT, ND; no flank pain Skin:  no rash or induration seen on limited exam Musculoskeletal:  grossly normal tone BUE/BLE, good ROM, no bony abnormality Psychiatric:  blunted mood and affect, speech fluent and appropriate, AOx3 Neurologic:  CN 2-12 grossly intact, moves all extremities  in coordinated fashion  Data Reviewed: I have reviewed the patient's lab results since admission.  Pertinent labs for today include:   CO2 16, improving Glucose 132 WBC 14 Platelets 105 CRP 19.4     Family Communication: None present  Mobility: PT/OT Consulted and are recommending - No Pt Follow Up10/07/2024 1100    Code Status: Full Code    Disposition: Status is: Inpatient Remains inpatient appropriate because: ongoing management, anticipate dc tomorrow     Time spent: 50 minutes  Unresulted Labs (From admission, onward)     Start     Ordered   07/26/24 0500  CBC with Differential/Platelet  Tomorrow morning,   R       Question:  Specimen collection method  Answer:  Lab=Lab collect   07/25/24 1610   07/26/24 0500  Basic metabolic panel with GFR  Tomorrow morning,   R       Question:  Specimen collection method  Answer:  Lab=Lab collect   07/25/24 1610             Author: Delon Herald, MD 07/25/2024 4:10 PM  For on call review [email or web address].

## 2024-07-25 NOTE — Progress Notes (Signed)
 VAST consult received to place midline. Reached out to Dr. Barbarann via SecureChat and confirmed that patient will be discharged home and needs 14 days of Rocephin.

## 2024-07-25 NOTE — Evaluation (Signed)
 Occupational Therapy Evaluation Patient Details Name: [name] MRN: [id number] DOB: [birth year] Today's Date: 07/25/2024   History of Present Illness   Patient is a 63 year old female who presented to the ED on 07/22/2024 with flank pain along with fever, chills, and nausea/vomiting.  She was found to have left renal colic in setting of nephrolithiasis and sepsis secondary to UTI.  Patient underwent cystoscopy with left retrograde pyelogram and left ureteral stent placement. Pt PMH Includes but is not limited to: anxiety, asthma, CVA, COPD, depression, fibromyalgia, diverticulitis, GERD, HOH, kidney stones, HLD, IBS, arthritis/RA, OSA, B TKA and B shoulder dysfunction.     Clinical Impressions The pt is currently presenting slightly below her baseline level of functioning for self-care management, as she limited by the below listed deficits (see OT problem list). She is typically modified independent to independent with ADLs. During the session today, she required SBA to CGA for most tasks, including supine to sit, sit to stand, toileting at bathroom level, and upper body grooming standing at the sink. She reported having chronic BUE shoulder AROM limitations and pain, due to arthritis and rotator cuff injuries. She was also noted to be with slight deconditioning and intermittent slight unsteadiness during dynamic standing tasks. She will benefit from further OT services to maximize her independence with self-care tasks and to facilitate her safe return home. No post-acute care therapy services are anticipated.      If plan is discharge home, recommend the following:   Help with stairs or ramp for entrance;Assistance with cooking/housework     Functional Status Assessment   Patient has had a recent decline in their functional status and demonstrates the ability to make significant improvements in function in a reasonable and predictable amount of time.     Equipment  Recommendations   None recommended by OT     Recommendations for Other Services         Precautions/Restrictions   Restrictions Weight Bearing Restrictions Per Provider Order: No     Mobility Bed Mobility Overal bed mobility: Needs Assistance Bed Mobility: Supine to Sit     Supine to sit: Supervision          Transfers Overall transfer level: Needs assistance Equipment used: None Transfers: Sit to/from Stand Sit to Stand: Contact guard assist                  Balance Overall balance assessment: Mild deficits observed, not formally tested            ADL either performed or assessed with clinical judgement   ADL Overall ADL's : Needs assistance/impaired Eating/Feeding: Independent;Sitting   Grooming: Contact guard assist;Standing Grooming Details (indicate cue type and reason): for hand washing standing at the sink Upper Body Bathing: Set up;Sitting   Lower Body Bathing: Contact guard assist;Sit to/from stand   Upper Body Dressing : Set up;Sitting   Lower Body Dressing: Contact guard assist;Sit to/from stand   Toilet Transfer: Contact guard assist;Ambulation;Grab bars   Toileting- Clothing Manipulation and Hygiene: Contact guard assist;Sit to/from stand Toileting - Clothing Manipulation Details (indicate cue type and reason): at bathroom level             Vision Baseline Vision/History: 1 Wears glasses              Pertinent Vitals/Pain Pain Assessment Pain Assessment: 0-10 Pain Score: 8  Pain Location:  (chronic pain of bilateral shoulders, due to reported arthritis and rotator cuff injuries) Pain Intervention(s): Monitored during  session, Repositioned, Limited activity within patient's tolerance     Extremity/Trunk Assessment Upper Extremity Assessment Upper Extremity Assessment: Right hand dominant;Left hand dominant;RUE deficits/detail;LUE deficits/detail RUE Deficits / Details: Chronic shoulder AROM limitations, which pt  attributed to baseline arthritis and rotator cuff injuries. She declined attempts at [facility] and PROM of shoulders, due to pain. Elbow and hand AROM WFL. Functional gross strength LUE Deficits / Details: Chronic shoulder AROM limitations, which pt attributed to baseline arthritis and rotator cuff injuries. She declined attempts at [facility] and PROM of shoulders, due to pain. Elbow and hand AROM WFL. Functional gross strength   Lower Extremity Assessment Lower Extremity Assessment: RLE deficits/detail;LLE deficits/detail;Overall WFL for tasks assessed RLE Deficits / Details: AROM WFL LLE Deficits / Details: AROM WFL      Communication Communication Communication: No apparent difficulties   Cognition Arousal: Alert Behavior During Therapy: WFL for tasks assessed/performed               OT - Cognition Comments: Oriented x4                 Following commands: Intact       Cueing  General Comments   Cueing Techniques: Verbal cues  on RA and vital signs WFL during eval           Home Living Family/patient expects to be discharged to:: Private residence Living Arrangements: Spouse/significant other;Children (spouse, son and grandson) Available Help at Discharge: Family Type of Home: Mobile home Home Access: Stairs to enter Entrance Stairs-Number of Steps: 2 Entrance Stairs-Rails: Left Home Layout: One level     Bathroom Shower/Tub: Producer, television/film/video: Standard Bathroom Accessibility: Yes   Home Equipment: Agricultural consultant (2 wheels);Shower seat;Cane - single point;BSC/3in1;Grab bars - tub/shower          Prior Functioning/Environment Prior Level of Function : Independent/Modified Independent;Driving             Mobility Comments:  (Independent with ambulation.) ADLs Comments:  (Modified independent to independent with ADLs, driving, and sharing cooking and cleaning tasks with her spouse.)    OT Problem List: Decreased  strength;Pain;Decreased range of motion   OT Treatment/Interventions: Self-care/ADL training;Therapeutic exercise;Therapeutic activities;Energy conservation;DME and/or AE instruction;Patient/family education;Balance training      OT Goals(Current goals can be found in the care plan section)   Acute Rehab OT Goals OT Goal Formulation: With patient Time For Goal Achievement: 08/08/24 Potential to Achieve Goals: Good ADL Goals Pt Will Perform Grooming: with modified independence;standing Pt Will Perform Lower Body Dressing: with modified independence;sit to/from stand;sitting/lateral leans Pt Will Transfer to Toilet: with modified independence;ambulating Pt Will Perform Toileting - Clothing Manipulation and hygiene: with modified independence;sit to/from stand   OT Frequency:  Min 2X/week    Co-evaluation PT/OT/SLP Co-Evaluation/Treatment: Yes Reason for Co-Treatment: To address functional/ADL transfers;For patient/therapist safety PT goals addressed during session: Mobility/safety with mobility;Balance OT goals addressed during session: ADL's and self-care;Proper use of Adaptive equipment and DME      AM-PAC OT 6 Clicks Daily Activity     Outcome Measure Help from another person eating meals?: None Help from another person taking care of personal grooming?: A Little Help from another person toileting, which includes using toliet, bedpan, or urinal?: A Little Help from another person bathing (including washing, rinsing, drying)?: A Little Help from another person to put on and taking off regular upper body clothing?: None Help from another person to put on and taking off regular lower body clothing?: A Little  6 Click Score: 20   End of Session Equipment Utilized During Treatment: Gait belt Nurse Communication: Other (comment) (nurse cleared pt for therapy participation)  Activity Tolerance: Patient tolerated treatment well Patient left: in chair;with call bell/phone within  reach;with chair alarm set  OT Visit Diagnosis: Muscle weakness (generalized) (M62.81);Pain Pain - part of body:  (shoulders)                Time: 8858-8785 OT Time Calculation (min): 33 min Charges:  OT General Charges $OT Visit: 1 Visit OT Evaluation $OT Eval Low Complexity: 1 Low    Delanna JINNY Lesches, OTR/L 07/25/2024, 1:42 PM

## 2024-07-25 NOTE — Progress Notes (Signed)
 ID brief note ( chart reviewed only, no charge note)   A/P # E coli bacteremia  # Complicated UTI  # Obstructive Uropathy  S/p cystoscopy with left retrograde pyelogram and left ureteral stent placement  # Allergy to ciprofloxacin and sulfa  - continue IV ceftriaxone, 2 weeks from 10/7 - place midline  - see OPAT below - fu with Urology for definite stone management  - ID will so D/w Primary team and ID pharmacy    OPAT Diagnosis: E coli bacteremia/Complicated UTI   Culture Result: E coli   Allergies  Allergen Reactions   Aquacel [Carboxymethylcellulose] Other (See Comments)    Blisters    Dextromethorphan Anaphylaxis   Nickel Rash    Turn read   Sulfonamide Derivatives Anaphylaxis, Hives and Swelling    Swelling of tongue   Aspirin  Nausea And Vomiting    Severe GI upset.  Pt states she can tolerate 81 mg asa only.    Ciprofloxacin Itching and Swelling    Angioedema, urticaria   Cosyntropin Hives and Swelling   Amoxicillin  Nausea And Vomiting    GI upset   Diclofenac Sodium Dermatitis    Gel causes legs to break out   Latex Rash    Blisters   Moxifloxacin Other (See Comments)    GI upset   Nsaids Other (See Comments)    Gi upset   Tape Other (See Comments) and Dermatitis    Blisters skin    OPAT Orders Discharge antibiotics to be given via PICC line Discharge antibiotics: IV ceftriaxone, dosing per pharmacy Duration: 2 weeks  End Date: 10/20  [facility] Care Per Protocol:  Home health RN for IV administration and teaching; PICC line care and labs.    Labs weekly while on IV antibiotics: X__ CBC with differential __ BMP X CMP X__ CRP __ ESR __ Vancomycin  trough __ CK  X__ Please pull PIC at completion of IV antibiotics __ Please leave PIC in place until doctor has seen patient or been notified  Fax weekly labs to [phone]  Clinic Follow Up Appt: 10/23 at 10 am with Dr Dennise   ID will so, recall back with questions or concerns  Annalee Orem, MD Infectious Disease Physician [facility] [address] [place], [place] [address] Phone: [phone]  Fax: [phone]

## 2024-07-25 NOTE — Progress Notes (Signed)
 PHARMACY CONSULT NOTE FOR:  OUTPATIENT  PARENTERAL ANTIBIOTIC THERAPY (OPAT)  Indication: E Coli Bacteremia Regimen: Ceftriaxone 2 gm IV q 24 hours End date: 08/04/24  IV antibiotic discharge orders are pended. To discharging provider:  please sign these orders via discharge navigator,  Select New Orders & click on the button choice - Manage This Unsigned Work.     Thank you for allowing pharmacy to be a part of this patient's care.  Damien Quiet, PharmD, BCPS, BCIDP Infectious Diseases Clinical Pharmacist Phone: [phone] 07/25/2024, 4:49 PM

## 2024-07-26 DIAGNOSIS — A419 Sepsis, unspecified organism: Secondary | ICD-10-CM | POA: Diagnosis not present

## 2024-07-26 DIAGNOSIS — N390 Urinary tract infection, site not specified: Secondary | ICD-10-CM | POA: Diagnosis not present

## 2024-07-26 LAB — CBC WITH DIFFERENTIAL/PLATELET
Abs Immature Granulocytes: 0.09 K/uL — ABNORMAL HIGH (ref 0.00–0.07)
Basophils Absolute: 0.1 K/uL (ref 0.0–0.1)
Basophils Relative: 0 %
Eosinophils Absolute: 0 K/uL (ref 0.0–0.5)
Eosinophils Relative: 0 %
HCT: 39.9 % (ref 36.0–46.0)
Hemoglobin: 12.8 g/dL (ref 12.0–15.0)
Immature Granulocytes: 1 %
Lymphocytes Relative: 9 %
Lymphs Abs: 1.1 K/uL (ref 0.7–4.0)
MCH: 31.4 pg (ref 26.0–34.0)
MCHC: 32.1 g/dL (ref 30.0–36.0)
MCV: 97.8 fL (ref 80.0–100.0)
Monocytes Absolute: 0.5 K/uL (ref 0.1–1.0)
Monocytes Relative: 4 %
Neutro Abs: 10.5 K/uL — ABNORMAL HIGH (ref 1.7–7.7)
Neutrophils Relative %: 86 %
Platelets: 138 K/uL — ABNORMAL LOW (ref 150–400)
RBC: 4.08 MIL/uL (ref 3.87–5.11)
RDW: 13.6 % (ref 11.5–15.5)
WBC: 12.3 K/uL — ABNORMAL HIGH (ref 4.0–10.5)
nRBC: 0 % (ref 0.0–0.2)

## 2024-07-26 LAB — BASIC METABOLIC PANEL WITH GFR
Anion gap: 11 (ref 5–15)
BUN: 14 mg/dL (ref 8–23)
CO2: 15 mmol/L — ABNORMAL LOW (ref 22–32)
Calcium: 8.6 mg/dL — ABNORMAL LOW (ref 8.9–10.3)
Chloride: 116 mmol/L — ABNORMAL HIGH (ref 98–111)
Creatinine, Ser: 0.4 mg/dL — ABNORMAL LOW (ref 0.44–1.00)
GFR, Estimated: >60 mL/min (ref >60)
Glucose, Bld: 134 mg/dL — ABNORMAL HIGH (ref 70–99)
Potassium: 3.9 mmol/L (ref 3.5–5.1)
Sodium: 141 mmol/L (ref 135–145)

## 2024-07-26 MED ORDER — TOPIRAMATE 100 MG PO TABS
100.0000 mg | ORAL_TABLET | Freq: Every day | ORAL | Status: DC
Start: 2024-07-26 — End: 2024-07-26

## 2024-07-26 MED ORDER — TOPIRAMATE 100 MG PO TABS: 100.0000 mg | ORAL_TABLET | Freq: Every day | ORAL | 0 refills | Status: AC

## 2024-07-26 MED ORDER — OXYCODONE HCL 5 MG PO TABS: 5.0000 mg | ORAL_TABLET | ORAL | 0 refills | Status: DC | PRN

## 2024-07-26 MED ORDER — SODIUM BICARBONATE 650 MG PO TABS: 650.0000 mg | ORAL_TABLET | Freq: Two times a day (BID) | ORAL | 0 refills | Status: AC

## 2024-07-26 NOTE — TOC Progression Note (Signed)
 Transition of Care [facility]) - Progression Note    Patient Details  Name: [name] MRN: [id number] Date of Birth: 1960/12/17  Transition of Care [facility]) CM/SW Contact  Lorraine LILLETTE Fenton, LCSW Phone Number: 07/26/2024, 1:58 PM  Clinical Narrative:     [place] pt clear for Dc needs IV antibiotics and HHRN.  CSW spoke with Pam at [employer]- she can follow up with family- they are prepared, but cannot offer education today. Consulted with providers available for a Sunday RN HV.   Visit to pt- [facility] to provide IV meds and pt chose [facility] for [facility] for education and follow up.  [place] to RN advising pt clear from [facility] and will add providers to AVS.    Barriers to Discharge: No Barriers Identified               Expected Discharge Plan and Services         Expected Discharge Date: 07/26/24                                     Social Drivers of Health (SDOH) Interventions SDOH Screenings   Food Insecurity: No Food Insecurity (07/22/2024)  Housing: Low Risk  (07/22/2024)  Transportation Needs: No Transportation Needs (07/22/2024)  Utilities: Not At Risk (07/22/2024)  Depression (PHQ2-9): Low Risk  (06/24/2024)  Financial Resource Strain: Medium Risk (05/18/2021)   Received from [facility] visits prior to 12/16/2022.  Physical Activity: Inactive (05/18/2021)   Received from [facility] visits prior to 12/16/2022.  Social Connections: Moderately Integrated (07/22/2024)  Stress: Stress Concern Present (05/18/2021)   Received from [facility] visits prior to 12/16/2022.  Tobacco Use: Medium Risk (07/22/2024)    Readmission Risk Interventions    07/24/2024    3:15 PM 03/19/2024    9:37 PM 02/26/2024    9:52 PM  Readmission Risk Prevention Plan  Post Dischage Appt   Complete  Medication Screening   Complete  Transportation Screening Complete Complete Complete  PCP or Specialist Appt within 5-7 Days  Complete   PCP or  Specialist Appt within 3-5 Days Complete    Home Care Screening  Complete   Medication Review (RN CM)  Complete   HRI or Home Care Consult Complete    Social Work Consult for Recovery Care Planning/Counseling Complete    Palliative Care Screening Not Applicable    Medication Review [employer]) Complete

## 2024-07-26 NOTE — Discharge Summary (Signed)
 Physician Discharge Summary   Patient: [name] MRN: [id number] DOB: 11/27/1960  Admit date:     07/22/2024  Discharge date: 07/26/24  Discharge Physician: [name]   PCP: [name]   Recommendations at discharge:   You are being discharged with home health nursing for IV antibiotics Continue antibiotics for 2 weeks from 10/7 or as per Infectious Disease Follow up with Infectious Disease (ID) as instructed Follow up with PA Gwendolyn next week; call for an appointment Follow up with urology for definitive stone management Decrease Topamax  dose to 100 mg daily Take bicarbonate twice daily until PCP follow up You are being prescribed a limited number of narcotic pain pills; take as directed and do not drive or make important decisions while taking this medication   Discharge Diagnoses: Principal Problem:   Sepsis secondary to UTI [facility]) Active Problems:   Obstructive sleep apnea   Allergic asthma, moderate persistent, uncomplicated   Complex partial epilepsy (HCC)   Elevated liver enzymes   Gastritis and gastroduodenitis   Chronic idiopathic constipation   Nausea and vomiting   COPD (chronic obstructive pulmonary disease) (HCC)   Neutropenia with fever   Renal colic on left side   Nephrolithiasis   Hyperlipidemia    Hospital Course: 63yo with h/o anxiety/depression, COPD, CVA, fibromyalgia, RA, OSA on CPAP, and HLD who presented on 10/7 with flank pain.  UA suggestive of UTI, CT with 4mm L UPJ calculus with mild to moderate hydronephrosis. BCID + E coli.  Given Ceftriaxone.  Underwent cystoscopy on 10/7 with stent placement.  Assessment and Plan:  Severe sepsis secondary to E coli UTI/bacteremia with obstructing nephrolithiasis Presented with fever, tachycardia, lactate elevation Admitted to SDU/inpatient Continue IV fluids Analgesics/antiemetics as needed Cefepime -> ceftriaxone Urine culture not sent Blood cultures positive for E coli,  resistant to Amp, Unasyn, Cefazolin  Will be discharged on IV Ceftriaxone for 2 weeks as per ID   B shoulder pain Reported BUE weakness and pain Appears to be MSK-related with paraspinous neck muscle TTP She has h/o RA and fibromyalgia Normal ESR but significantly elevated CRP Gave solumedrol 40 mg BID x 3 days Reports ongoing improvement Continue Zanaflex PT/OT consulted, no follow up is needed   Nephrolithiasis S/p cystoscopy 10/7 Plan for ureteroscopy in a couple of weeks after fully treated with antibiotics   Non-anion gap metabolic acidosis Anticipate that this is related to severe infection, possibly also Topamax  Continue to provide IVF hydration Added HCO3, will continue through outpatient f/u Decrease Topamax  rto 100 mg daily   Hypokalemia/hypomagnesemia/hypophosphatemia Repleting Will continue to follow   Obstructive sleep apnea Continue CPAP at bedtime   Allergic asthma, moderate persistent, uncomplicated/COPD (chronic obstructive pulmonary disease)  Bronchodilators as needed Supplemental oxygen as needed   Complex partial epilepsy Continue Topamax  but decrease dose to 100 mg daily   Elevated liver enzymes Likely related to sepsis, now resolved Continue ursodiol    Gastritis and gastroduodenitis Continue pantoprazole     Chronic idiopathic constipation Continue Amitiza     Hyperlipidemia  Continue pravastatin , ASA Also reportedly in a cholesterol study  Anxiety Continue alprazolam  and hydroxyzine , PDMP reviewed   HTN Uncontrolled Resumed amlodipine   Consider additional medication with PCP         Consultants: Urology   Procedures: Cystoscopy 10/7   Antibiotics: Cefepime 10/7-8 Ceftriaxone 10/8-   Pain control - [place]  Controlled Substance Reporting System database was reviewed. and patient was instructed, not to drive, operate heavy machinery, perform activities at heights, swimming or  participation in water  activities or provide  baby-sitting services while on Pain, Sleep and Anxiety Medications; until their outpatient Physician has advised to do so again. Also recommended to not to take more than prescribed Pain, Sleep and Anxiety Medications.   Disposition: Home Diet recommendation:  Regular diet DISCHARGE MEDICATION: Allergies as of 07/26/2024       Reactions   Aquacel [carboxymethylcellulose] Other (See Comments)   Blisters   Dextromethorphan Anaphylaxis   Nickel Rash   Turn read   Sulfonamide Derivatives Anaphylaxis, Hives, Swelling   Swelling of tongue   Aspirin  Nausea And Vomiting   Severe GI upset.  Pt states she can tolerate 81 mg asa only.    Ciprofloxacin Itching, Swelling   Angioedema, urticaria   Cosyntropin Hives, Swelling   Amoxicillin  Nausea And Vomiting   GI upset   Diclofenac Sodium Dermatitis   Gel causes legs to break out   Latex Rash   Blisters   Moxifloxacin Other (See Comments)   GI upset   Nsaids Other (See Comments)   Gi upset   Tape Other (See Comments), Dermatitis   Blisters skin        Medication List     STOP taking these medications    hydrocortisone  25 MG suppository Commonly known as: ANUSOL -HC       TAKE these medications    albuterol  108 (90 Base) MCG/ACT inhaler Commonly known as: VENTOLIN  HFA Inhale 1-2 puffs into the lungs every 6 (six) hours as needed for wheezing or shortness of breath.   ALPRAZolam  0.5 MG tablet Commonly known as: XANAX  Take 0.5 mg by mouth at bedtime.   amLODipine  5 MG tablet Commonly known as: NORVASC  Take 1 tablet (5 mg total) by mouth daily.   aspirin  EC 81 MG tablet Take 81 mg by mouth at bedtime.   cefTRIAXone IVPB Commonly known as: ROCEPHIN Inject 2 g into the vein daily for 10 days. Indication:  E Coli Bacteremia First Dose: Yes Last Day of Therapy:  08/04/24 Labs - Once weekly:  CBC/D and BMP, Labs - Once weekly: ESR and CRP Method of administration: IV Push Method of administration may be changed at the  discretion of home infusion pharmacist based upon assessment of the patient and/or caregiver's ability to self-administer the medication ordered.   dicyclomine  10 MG capsule Commonly known as: BENTYL  Take 1 capsule (10 mg total) by mouth 4 (four) times daily -  before meals and at bedtime. What changed: when to take this   hydrOXYzine  10 MG tablet Commonly known as: ATARAX  Take 1 tablet (10 mg total) by mouth at bedtime as needed and may repeat dose one time if needed.   hyoscyamine  0.125 MG SL tablet Commonly known as: LEVSIN  SL Place 1 tablet (0.125 mg total) under the tongue every 6 (six) hours as needed.   ipratropium 0.03 % nasal spray Commonly known as: ATROVENT  Place 2 sprays into the nose in the morning and at bedtime.   loratadine  10 MG tablet Commonly known as: CLARITIN  Take 10 mg by mouth at bedtime.   lubiprostone  24 MCG capsule Commonly known as: AMITIZA  Take 1 capsule (24 mcg total) by mouth 2 (two) times daily with a meal.   nitroGLYCERIN  0.4 MG SL tablet Commonly known as: NITROSTAT  Place 1 tablet (0.4 mg total) under the tongue every 5 (five) minutes as needed for chest pain.   OVER THE COUNTER MEDICATION Bio True eye drops daily.   oxyCODONE  5 MG immediate release tablet Commonly known as:  Oxy IR/ROXICODONE  Take 1 tablet (5 mg total) by mouth every 4 (four) hours as needed for severe pain (pain score 7-10).   pantoprazole  40 MG tablet Commonly known as: PROTONIX  TAKE 1 TABLET BY MOUTH DAILY   pravastatin  40 MG tablet Commonly known as: PRAVACHOL  Take 40 mg by mouth daily.   PRESCRIPTION MEDICATION In Cholesterol study. Has injection once a month.has been in study for 3 years as of 12/07/23 and has 6 months to left in study. ( Will finish around end of August 2025 )   rizatriptan 5 MG tablet Commonly known as: MAXALT Take 5 mg by mouth as needed for migraine. (not more than 3 a week). May repeat in 2 hours if needed   sodium bicarbonate 650 MG  tablet Take 1 tablet (650 mg total) by mouth 2 (two) times daily for 10 days.   Sucraid  8500 UNIT/ML Soln Generic drug: Sacrosidase  Take 2 mLs (17,000 Units total) by mouth with breakfast, with lunch, and with evening meal. Administer half the dose at the beginning of th meal or snack and other half during the meals What changed: how much to take   tiZANidine 4 MG tablet Commonly known as: ZANAFLEX Take 1 tablet by mouth every 6 (six) hours as needed for muscle spasms.   topiramate  100 MG tablet Commonly known as: TOPAMAX  Take 1 tablet (100 mg total) by mouth at bedtime. What changed:  medication strength how much to take when to take this additional instructions   ursodiol  300 MG capsule Commonly known as: ACTIGALL  Take 1 capsule (300 mg total) by mouth 3 (three) times daily.               Discharge Care Instructions  (From admission, onward)           Start     Ordered   07/26/24 0000  Change dressing (specify)       Comments: Dressing change: weekly and as needed   07/26/24 1223   07/25/24 0000  Change dressing on IV access line weekly and PRN  (Home infusion instructions - [facility] )        07/25/24 1651            Follow-up Information     [name]. Call in 1 week(s).   Specialty: Family Medicine Why: Will need repeat BMP and hospital f/u Contact information: [address] [place] [address] [phone]         Dea Shiner, MD Follow up.   Specialty: Infectious Diseases Contact information: [address] [place] [address] [phone]         Carolee Sherwood JONETTA DOUGLAS, MD .   Specialty: Urology Contact information: [address] [place] [address] [phone]                Discharge Exam:    Subjective: Feeling well, ambulating in room without difficulty, ready for dc.  Still some R neck/shoulder tightness and discomfort but clearly  improving.   Objective: Vitals:   07/25/24 2012 07/26/24 0439  BP: 123/73 (!) 153/96  Pulse: 64 60  Resp: 20 20  Temp: 97.8 F (36.6 C) 97.8 F (36.6 C)  SpO2: 100% 99%    Intake/Output Summary (Last 24 hours) at 07/26/2024 1223 Last data filed at 07/25/2024 1400 Gross per 24 hour  Intake 56.43 ml  Output --  Net 56.43 ml   Filed Weights   07/22/24 0901 07/22/24 1401 07/22/24  1635  Weight: 66.5 kg 66 kg 68.6 kg    Exam:  General:  Appears calm and comfortable with improving BUE weakness and neck TTP, ambulatory without assistance and with good ROM of BUE Eyes:  normal lids, iris ENT:  grossly normal hearing, lips & tongue, mmm Cardiovascular:  RRR. No LE edema.  Respiratory:   CTA bilaterally with no wheezes/rales/rhonchi.  Normal respiratory effort. Abdomen:  soft, NT, ND; no flank pain Skin:  no rash or induration seen on limited exam Musculoskeletal:  grossly normal tone BUE/BLE, good ROM, no bony abnormality Psychiatric:  normal mood and affect, speech fluent and appropriate, AOx3 Neurologic:  CN 2-12 grossly intact, moves all extremities in coordinated fashion  Data Reviewed: I have reviewed the patient's lab results since admission.  Pertinent labs for today include:   CO2 15 Glucose 134 WBC 12.3    Condition at discharge: improving  The results of significant diagnostics from this hospitalization (including imaging, microbiology, ancillary and laboratory) are listed below for reference.   Imaging Studies: DG Abd 1 View Result Date: 07/23/2024 CLINICAL DATA:  10026 Shortness of breath 10026. EXAM: ABDOMEN - 1 VIEW COMPARISON:  07/12/2023. FINDINGS: The bowel gas pattern is non-obstructive. No evidence of pneumoperitoneum, within the limitations of a supine film. No acute osseous abnormalities. Left-sided ureteric stent noted. There is a 5 mm faint calcification overlying the left renal shadow region, which may represent renal calculus. The soft tissues are  otherwise within normal limits. Surgical changes, devices, tubes and lines: There are multiple metallic anchors overlying the left side of the pelvis, likely from prior hernia repair. IMPRESSION: Nonobstructive bowel gas pattern. Electronically Signed   By: Ree Molt M.D.   On: 07/23/2024 09:40   DG CHEST PORT 1 VIEW Result Date: 07/23/2024 CLINICAL DATA:  10026 Shortness of breath 10026. EXAM: PORTABLE CHEST 1 VIEW COMPARISON:  03/19/2024. FINDINGS: There is persistent mild prominence of interstitial markings, which is nonspecific but essentially unchanged since the prior study. No frank pulmonary edema. There are probable atelectatic changes at the left lung base. Bilateral lung fields are otherwise clear. No acute consolidation or lung collapse. Bilateral costophrenic angles are clear. Stable cardio-mediastinal silhouette. No acute osseous abnormalities. The soft tissues are within normal limits. IMPRESSION: No acute cardiopulmonary abnormality. Electronically Signed   By: Ree Molt M.D.   On: 07/23/2024 09:38   DG C-Arm 1-60 Min-No Report Result Date: 07/22/2024 Fluoroscopy was utilized by the requesting physician.  No radiographic interpretation.   CT Renal Stone Study Result Date: 07/22/2024 CLINICAL DATA:  Left flank pain. EXAM: CT ABDOMEN AND PELVIS WITHOUT CONTRAST TECHNIQUE: Multidetector CT imaging of the abdomen and pelvis was performed following the standard protocol without IV contrast. RADIATION DOSE REDUCTION: This exam was performed according to the departmental dose-optimization program which includes automated exposure control, adjustment of the mA and/or kV according to patient size and/or use of iterative reconstruction technique. COMPARISON:  CTA abdomen pelvis dated 03/14/2024. FINDINGS: Lower chest: Similar linear scarring at the right lung base. Hepatobiliary: No suspicious focal hepatic lesion identified within the limits of an unenhanced exam. Status post  cholecystectomy. No biliary dilatation. Pancreas: Unremarkable. No pancreatic ductal dilatation or surrounding inflammatory changes. Spleen: Normal in size without focal abnormality. Adrenals/Urinary Tract: 4 mm calculus in the left proximal ureter near the ureteropelvic junction with mild-to-moderate left hydronephrosis and perinephric stranding. 5 mm nonobstructive calculus at the inferior pole of the left kidney. No right-sided urolithiasis or hydronephrosis. Bladder is mildly distended and otherwise  unremarkable. Adrenal glands are unremarkable. Stomach/Bowel: Stomach is within normal limits. Status post appendectomy. No obstruction. The colon is decompressed with wall thickening which may be secondary to underdistention or a mild nonspecific colitis. Sigmoid colonic diverticulosis without evidence of acute diverticulitis. Vascular/Lymphatic: Abdominal aorta is normal in caliber with atherosclerotic calcification. No enlarged abdominal or pelvic lymph nodes. Reproductive: Status post hysterectomy. No adnexal masses. Other: Surgical mesh repair of the lower anterior abdominal wall hernia. No evidence of recurrent hernia. No abdominopelvic ascites. No intraperitoneal free air. Musculoskeletal: No acute osseous abnormality. No suspicious osseous lesion. IMPRESSION: 1. 4 mm calculus in the left proximal ureter near the ureteropelvic junction with mild-to-moderate left hydronephrosis and perinephric stranding. 2. Nonobstructive 5 mm calculus at the inferior pole of the left kidney. 3. The colon is decompressed with wall thickening which may be secondary to underdistention or a mild nonspecific colitis. 4. Sigmoid colonic diverticulosis without evidence of acute diverticulitis. 5.  Aortic Atherosclerosis (ICD10-I70.0). Electronically Signed   By: Harrietta Sherry M.D.   On: 07/22/2024 10:47    Microbiology: Results for orders placed or performed during the hospital encounter of 07/22/24  Blood culture (routine x  2)     Status: Abnormal   Collection Time: 07/22/24 11:32 AM   Specimen: BLOOD  Result Value Ref Range Status   Specimen Description   Final    BLOOD BLOOD LEFT HAND Performed at [facility], [address], [place], [place] [address]    Special Requests   Final    BOTTLES DRAWN AEROBIC AND ANAEROBIC Blood Culture results may not be optimal due to an inadequate volume of blood received in culture bottles Performed at [facility], [address] [place], [place] [address]    Culture  Setup Time   Final    GRAM NEGATIVE RODS IN BOTH AEROBIC AND ANAEROBIC BOTTLES Gram Stain Report Called to,Read Back By and Verified With: S RUSSELL,RN@0058  07/23/24 MK CRITICAL VALUE NOTED.  VALUE IS CONSISTENT WITH PREVIOUSLY REPORTED AND CALLED VALUE.    Culture (A)  Final    ESCHERICHIA COLI SUSCEPTIBILITIES PERFORMED ON PREVIOUS CULTURE WITHIN THE LAST 5 DAYS. Performed at [facility], [address] [place], [place] [address]    Report Status 07/25/2024 FINAL  Final  Blood culture (routine x 2)     Status: Abnormal   Collection Time: 07/22/24 11:34 AM   Specimen: BLOOD  Result Value Ref Range Status   Specimen Description   Final    BLOOD BLOOD LEFT HAND Performed at [facility], [address], [place], [place] [address]    Special Requests   Final    BOTTLES DRAWN AEROBIC ONLY Blood Culture results may not be optimal due to an inadequate volume of blood received in culture bottles Performed at [facility], [address] [place], [place] [address]    Culture  Setup Time   Final    GRAM NEGATIVE RODS AEROBIC BOTTLE ONLY Gram Stain Report Called to,Read Back By and Verified With: S RUSSELL,RN@0058  07/23/24 MK CRITICAL RESULT CALLED TO, READ BACK BY AND VERIFIED WITH: L POINTDEXTER PHARMD 07/23/2024 0502 BY DD Performed at [facility], [address] [place], [place] [address]    Culture ESCHERICHIA COLI (A)  Final   Report Status 07/25/2024 FINAL  Final   Organism ID,  Bacteria ESCHERICHIA COLI  Final      Susceptibility   Escherichia coli - MIC*    AMPICILLIN >=32 RESISTANT Resistant     CEFAZOLIN  (  NON-URINE) 4 INTERMEDIATE Intermediate     CEFEPIME <=0.12 SENSITIVE Sensitive     ERTAPENEM <=0.12 SENSITIVE Sensitive     CEFTRIAXONE <=0.25 SENSITIVE Sensitive     CIPROFLOXACIN <=0.06 SENSITIVE Sensitive     GENTAMICIN <=1 SENSITIVE Sensitive     MEROPENEM  <=0.25 SENSITIVE Sensitive     TRIMETH/SULFA <=20 SENSITIVE Sensitive     AMPICILLIN/SULBACTAM >=32 RESISTANT Resistant     PIP/TAZO Value in next row Sensitive      <=4 SENSITIVEThis is a modified FDA-approved test that has been validated and its performance characteristics determined by the reporting laboratory.  This laboratory is certified under the Clinical Laboratory Improvement Amendments CLIA as qualified to perform high complexity clinical laboratory testing.    * ESCHERICHIA COLI  Blood Culture ID Panel (Reflexed)     Status: Abnormal   Collection Time: 07/22/24 11:34 AM  Result Value Ref Range Status   Enterococcus faecalis NOT DETECTED NOT DETECTED Final   Enterococcus Faecium NOT DETECTED NOT DETECTED Final   Listeria monocytogenes NOT DETECTED NOT DETECTED Final   Staphylococcus species NOT DETECTED NOT DETECTED Final   Staphylococcus aureus (BCID) NOT DETECTED NOT DETECTED Final   Staphylococcus epidermidis NOT DETECTED NOT DETECTED Final   Staphylococcus lugdunensis NOT DETECTED NOT DETECTED Final   Streptococcus species NOT DETECTED NOT DETECTED Final   Streptococcus agalactiae NOT DETECTED NOT DETECTED Final   Streptococcus pneumoniae NOT DETECTED NOT DETECTED Final   Streptococcus pyogenes NOT DETECTED NOT DETECTED Final   A.calcoaceticus-baumannii NOT DETECTED NOT DETECTED Final   Bacteroides fragilis NOT DETECTED NOT DETECTED Final   Enterobacterales DETECTED (A) NOT DETECTED Final    Comment: Enterobacterales represent a large order of gram negative bacteria, not a single  organism. CRITICAL RESULT CALLED TO, READ BACK BY AND VERIFIED WITH: L POINTDEXTER PHARMD 07/23/2024 0502 BY DD    Enterobacter cloacae complex NOT DETECTED NOT DETECTED Final   Escherichia coli DETECTED (A) NOT DETECTED Final    Comment: CRITICAL RESULT CALLED TO, READ BACK BY AND VERIFIED WITH: L POINTDEXTER PHARMD 07/23/2024 0502 BY DD    Klebsiella aerogenes NOT DETECTED NOT DETECTED Final   Klebsiella oxytoca NOT DETECTED NOT DETECTED Final   Klebsiella pneumoniae NOT DETECTED NOT DETECTED Final   Proteus species NOT DETECTED NOT DETECTED Final   Salmonella species NOT DETECTED NOT DETECTED Final   Serratia marcescens NOT DETECTED NOT DETECTED Final   Haemophilus influenzae NOT DETECTED NOT DETECTED Final   Neisseria meningitidis NOT DETECTED NOT DETECTED Final   Pseudomonas aeruginosa NOT DETECTED NOT DETECTED Final   Stenotrophomonas maltophilia NOT DETECTED NOT DETECTED Final   Candida albicans NOT DETECTED NOT DETECTED Final   Candida auris NOT DETECTED NOT DETECTED Final   Candida glabrata NOT DETECTED NOT DETECTED Final   Candida krusei NOT DETECTED NOT DETECTED Final   Candida parapsilosis NOT DETECTED NOT DETECTED Final   Candida tropicalis NOT DETECTED NOT DETECTED Final   Cryptococcus neoformans/gattii NOT DETECTED NOT DETECTED Final   CTX-M ESBL NOT DETECTED NOT DETECTED Final   Carbapenem resistance IMP NOT DETECTED NOT DETECTED Final   Carbapenem resistance KPC NOT DETECTED NOT DETECTED Final   Carbapenem resistance NDM NOT DETECTED NOT DETECTED Final   Carbapenem resist OXA 48 LIKE NOT DETECTED NOT DETECTED Final   Carbapenem resistance VIM NOT DETECTED NOT DETECTED Final    Comment: Performed at [facility], [address], [place], [place] [address]  MRSA Next Gen by PCR, Nasal     Status:  None   Collection Time: 07/23/24  8:06 PM   Specimen: Nasal Mucosa; Nasal Swab  Result Value Ref Range Status   MRSA by PCR Next Gen NOT DETECTED NOT DETECTED Final     Comment: (NOTE) The GeneXpert MRSA Assay (FDA approved for NASAL specimens only), is one component of a comprehensive MRSA colonization surveillance program. It is not intended to diagnose MRSA infection nor to guide or monitor treatment for MRSA infections. Test performance is not FDA approved in patients less than 46 years old. Performed at [facility], [address], [place], [place] [address]     Labs: CBC: Recent Labs  Lab 07/22/24 [phone] 07/23/24 0259 07/24/24 0253 07/25/24 0307 07/26/24 0540  WBC 0.9* 16.6* 10.4 14.0* 12.3*  NEUTROABS 0.4*  --  9.7* 12.9* 10.5*  HGB 15.2* 12.3 10.8* 12.8 12.8  HCT 45.4 38.4 33.1* 39.3 39.9  MCV 96.2 99.0 97.6 97.5 97.8  PLT 172 89* 81* 105* 138*   Basic Metabolic Panel: Recent Labs  Lab 07/22/24 1947 07/23/24 0259 07/23/24 1731 07/24/24 0253 07/25/24 0307 07/26/24 0540  NA  --  145 142 141 142 141  K  --  2.8* 4.6 3.3* 3.9 3.9  CL  --  116* 118* 117* 115* 116*  CO2  --  14* 12* 15* 16* 15*  GLUCOSE  --  79 90 78 132* 134*  BUN  --  8 13 12 12 14   CREATININE  --  0.76 0.69 0.53 0.44 0.40*  CALCIUM   --  7.5* 7.3* 7.0* 8.3* 8.6*  MG 1.5*  --   --   --   --   --   PHOS 2.4*  --   --   --   --   --    Liver Function Tests: Recent Labs  Lab 07/22/24 0907 07/23/24 0259  AST 36 41  ALT 46* 35  ALKPHOS 138* 76  BILITOT 0.9 1.1  PROT 7.3 5.3*  ALBUMIN 4.4 3.0*   CBG: Recent Labs  Lab 07/22/24 1356  GLUCAP 110*    Discharge time spent: greater than 30 minutes.  Signed: Delon Herald, MD [facility] 07/26/2024

## 2024-07-28 ENCOUNTER — Other Ambulatory Visit: Payer: Self-pay | Admitting: Urology

## 2024-07-28 ENCOUNTER — Telehealth: Payer: Self-pay | Admitting: Cardiology

## 2024-07-28 NOTE — Telephone Encounter (Signed)
   Pre-operative Risk Assessment    Patient Name: [name]  DOB: 1960-10-19 MRN: [id number]   Date of last office visit: 04/14/24  Date of next office visit: n/a   Request for Surgical Clearance    Procedure:  Ureteroscopy removal   Date of Surgery:  Clearance 08/13/24                                Surgeon:  Dr. Carolee Surgeon's Group or Practice Name:  [facility] Phone number:   [phone] Fax number:  [phone]   Type of Clearance Requested:   - Medical  - Pharmacy:  Hold Aspirin  5 days    Type of Anesthesia:  General    Additional requests/questions:     SignedBarbee DELENA Sharps   07/28/2024, 4:39 PM

## 2024-07-29 ENCOUNTER — Telehealth (HOSPITAL_BASED_OUTPATIENT_CLINIC_OR_DEPARTMENT_OTHER): Payer: Self-pay | Admitting: *Deleted

## 2024-07-29 NOTE — Telephone Encounter (Signed)
   Name: [name]  DOB: 1961/05/26  MRN: [id number]  Primary Cardiologist: None  Chart reviewed as part of pre-operative protocol coverage. Because of [name]'s past medical history and time since last visit, she will require a follow-up telephone visit in order to better assess preoperative cardiovascular risk.  Pre-op covering staff: - Please schedule appointment and call patient to inform them. If patient already had an upcoming appointment within acceptable timeframe, please add pre-op clearance to the appointment notes so provider is aware. - Please contact requesting surgeon's office via preferred method (i.e, phone, fax) to inform them of need for appointment prior to surgery.  As long as no new symptoms at the time of telephone visit, patient can hold aspirin  x 5 days prior to surgery.  Please resume when medically safe to do so.  Orren LOISE Fabry, PA-C  07/29/2024, 7:41 AM

## 2024-07-29 NOTE — Telephone Encounter (Signed)
 Pt has been scheduled tele preop appt 08/07/24. Med rec and consent are done.    Patient Consent for Virtual Visit        [name] has provided verbal consent on 07/29/2024 for a virtual visit (video or telephone).   CONSENT FOR VIRTUAL VISIT FOR:  [name]  By participating in this virtual visit I agree to the following:  I hereby voluntarily request, consent and authorize [facility] and its employed or contracted physicians, physician assistants, nurse practitioners or other licensed health care professionals (the Practitioner), to provide me with telemedicine health care services (the "Services) as deemed necessary by the treating Practitioner. I acknowledge and consent to receive the Services by the Practitioner via telemedicine. I understand that the telemedicine visit will involve communicating with the Practitioner through live audiovisual communication technology and the disclosure of certain medical information by electronic transmission. I acknowledge that I have been given the opportunity to request an in-person assessment or other available alternative prior to the telemedicine visit and am voluntarily participating in the telemedicine visit.  I understand that I have the right to withhold or withdraw my consent to the use of telemedicine in the course of my care at any time, without affecting my right to future care or treatment, and that the Practitioner or I may terminate the telemedicine visit at any time. I understand that I have the right to inspect all information obtained and/or recorded in the course of the telemedicine visit and may receive copies of available information for a reasonable fee.  I understand that some of the potential risks of receiving the Services via telemedicine include:  Delay or interruption in medical evaluation due to technological equipment failure or disruption; Information transmitted may not be sufficient (e.g. poor  resolution of images) to allow for appropriate medical decision making by the Practitioner; and/or  In rare instances, security protocols could fail, causing a breach of personal health information.  Furthermore, I acknowledge that it is my responsibility to provide information about my medical history, conditions and care that is complete and accurate to the best of my ability. I acknowledge that Practitioner's advice, recommendations, and/or decision may be based on factors not within their control, such as incomplete or inaccurate data provided by me or distortions of diagnostic images or specimens that may result from electronic transmissions. I understand that the practice of medicine is not an exact science and that Practitioner makes no warranties or guarantees regarding treatment outcomes. I acknowledge that a copy of this consent can be made available to me via my patient portal [facility] MyChart), or I can request a printed copy by calling the office of [facility].    I understand that my insurance will be billed for this visit.   I have read or had this consent read to me. I understand the contents of this consent, which adequately explains the benefits and risks of the Services being provided via telemedicine.  I have been provided ample opportunity to ask questions regarding this consent and the Services and have had my questions answered to my satisfaction. I give my informed consent for the services to be provided through the use of telemedicine in my medical care

## 2024-07-29 NOTE — Telephone Encounter (Signed)
 Pt has been scheduled tele preop appt 08/07/24. Med rec and consent are done.

## 2024-07-30 ENCOUNTER — Telehealth (INDEPENDENT_AMBULATORY_CARE_PROVIDER_SITE_OTHER): Payer: Self-pay | Admitting: Gastroenterology

## 2024-07-30 ENCOUNTER — Encounter (INDEPENDENT_AMBULATORY_CARE_PROVIDER_SITE_OTHER): Payer: Self-pay | Admitting: Gastroenterology

## 2024-07-30 NOTE — Telephone Encounter (Signed)
 Fax from [facility] stating that Sucraid  will be shipped to pt on 08/01/24.  This is the last refill patient has on file. Please send a new prescription for us  to dispense the next refill.  [facility] [address] [place], [place] [address]  Fax- [phone] Phone- [phone]

## 2024-08-01 ENCOUNTER — Other Ambulatory Visit: Payer: Self-pay

## 2024-08-01 ENCOUNTER — Emergency Department (HOSPITAL_COMMUNITY)
Admission: EM | Admit: 2024-08-01 | Discharge: 2024-08-01 | Disposition: A | Discharge status: Home / Self Care | Attending: Emergency Medicine | Admitting: Emergency Medicine

## 2024-08-01 DIAGNOSIS — Z466 Encounter for fitting and adjustment of urinary device: Secondary | ICD-10-CM

## 2024-08-01 DIAGNOSIS — Z91040 Latex allergy status: Secondary | ICD-10-CM | POA: Diagnosis not present

## 2024-08-01 DIAGNOSIS — T82594A Other mechanical complication of infusion catheter, initial encounter: Secondary | ICD-10-CM | POA: Diagnosis present

## 2024-08-01 DIAGNOSIS — Y718 Miscellaneous cardiovascular devices associated with adverse incidents, not elsewhere classified: Secondary | ICD-10-CM | POA: Insufficient documentation

## 2024-08-01 DIAGNOSIS — J4489 Other specified chronic obstructive pulmonary disease: Secondary | ICD-10-CM | POA: Insufficient documentation

## 2024-08-01 DIAGNOSIS — Z7982 Long term (current) use of aspirin: Secondary | ICD-10-CM | POA: Insufficient documentation

## 2024-08-01 MED ORDER — SODIUM CHLORIDE 0.9 % IV SOLN
2.0000 g | Freq: Once | INTRAVENOUS | Status: AC
  Administered 2024-08-01: 2 g via INTRAVENOUS
  Filled 2024-08-01: qty 20

## 2024-08-01 NOTE — ED Provider Notes (Signed)
 [place] EMERGENCY DEPARTMENT AT [facility] Provider Note   CSN: [id number] Arrival date & time: 08/01/24  1941     Patient presents with: No chief complaint on file.   [name] is a 63 y.o. female  {Add pertinent medical, surgical, social history, OB history to HPI:32947} HPI     Prior to Admission medications   Medication Sig Start Date End Date Taking? Authorizing Provider  albuterol  (VENTOLIN  HFA) 108 (90 Base) MCG/ACT inhaler Inhale 1-2 puffs into the lungs every 6 (six) hours as needed for wheezing or shortness of breath.    [provider]  ALPRAZolam  (XANAX ) 0.5 MG tablet Take 0.5 mg by mouth at bedtime. 06/10/24   [provider]  amLODipine  (NORVASC ) 5 MG tablet Take 1 tablet (5 mg total) by mouth daily. 04/14/24   Emelia Josefa HERO, NP  aspirin  EC 81 MG tablet Take 81 mg by mouth at bedtime.    [provider]  cefTRIAXone (ROCEPHIN) IVPB Inject 2 g into the vein daily for 10 days. Indication:  E Coli Bacteremia First Dose: Yes Last Day of Therapy:  08/04/24 Labs - Once weekly:  CBC/D and BMP, Labs - Once weekly: ESR and CRP Method of administration: IV Push Method of administration may be changed at the discretion of home infusion pharmacist based upon assessment of the patient and/or caregiver's ability to self-administer the medication ordered. 07/25/24 08/04/24  Manandhar, Sabina, MD  dicyclomine  (BENTYL ) 10 MG capsule Take 1 capsule (10 mg total) by mouth 4 (four) times daily -  before meals and at bedtime. Patient taking differently: Take 10 mg by mouth 4 (four) times daily. 02/29/24 07/29/24  Maree, Pratik D, DO  hydrOXYzine  (ATARAX ) 10 MG tablet Take 1 tablet (10 mg total) by mouth at bedtime as needed and may repeat dose one time if needed. 07/07/24 08/06/24  Ahmed, Muhammad F, MD  hyoscyamine  (LEVSIN  SL) 0.125 MG SL tablet Place 1 tablet (0.125 mg total) under the tongue every 6 (six) hours as needed. 03/13/24   Carlan,  Chelsea L, NP  ipratropium (ATROVENT ) 0.03 % nasal spray Place 2 sprays into the nose in the morning and at bedtime. 05/18/21   [provider]  loratadine  (CLARITIN ) 10 MG tablet Take 10 mg by mouth at bedtime.    [provider]  lubiprostone  (AMITIZA ) 24 MCG capsule Take 1 capsule (24 mcg total) by mouth 2 (two) times daily with a meal. 06/04/24 12/01/24  Ahmed, Deatrice FALCON, MD  nitroGLYCERIN  (NITROSTAT ) 0.4 MG SL tablet Place 1 tablet (0.4 mg total) under the tongue every 5 (five) minutes as needed for chest pain. Patient not taking: Reported on 07/29/2024 03/13/23 07/22/24  Ladona Heinz, MD  OVER THE COUNTER MEDICATION Bio True eye drops daily.    [provider]  oxyCODONE  (OXY IR/ROXICODONE ) 5 MG immediate release tablet Take 1 tablet (5 mg total) by mouth every 4 (four) hours as needed for severe pain (pain score 7-10). 07/26/24   Barbarann Nest, MD  pantoprazole  (PROTONIX ) 40 MG tablet TAKE 1 TABLET BY MOUTH DAILY 02/06/24   Ahmed, Deatrice FALCON, MD  pravastatin  (PRAVACHOL ) 40 MG tablet Take 40 mg by mouth daily.    [provider]  PREBIOTIC PRODUCT PO Take by mouth at bedtime.    [provider]  PRESCRIPTION MEDICATION In Cholesterol study. Has injection once a month.has been in study for 3 years as of 12/07/23 and has 6 months to left in study. ( Will finish around  end of August 2025 )    [provider]  rizatriptan (MAXALT) 5 MG tablet Take 5 mg by mouth as needed for migraine. (not more than 3 a week). May repeat in 2 hours if needed 12/06/23 12/05/24  [provider]  Sacrosidase  (SUCRAID ) 8500 UNIT/ML SOLN Take 2 mLs (17,000 Units total) by mouth with breakfast, with lunch, and with evening meal. Administer half the dose at the beginning of th meal or snack and other half during the meals Patient taking differently: Take 2 mLs by mouth with breakfast, with lunch, and with evening meal. Administer half the dose at the beginning of th meal  or snack and other half during the meals 05/12/24 08/10/24  Ahmed, Deatrice FALCON, MD  sodium bicarbonate 650 MG tablet Take 1 tablet (650 mg total) by mouth 2 (two) times daily for 10 days. 07/26/24 08/05/24  Barbarann Nest, MD  tiZANidine (ZANAFLEX) 4 MG tablet Take 1 tablet by mouth every 6 (six) hours as needed for muscle spasms. 10/05/23   [provider]  topiramate  (TOPAMAX ) 100 MG tablet Take 1 tablet (100 mg total) by mouth at bedtime. 07/26/24   Barbarann Nest, MD  ursodiol  (ACTIGALL ) 300 MG capsule Take 1 capsule (300 mg total) by mouth 3 (three) times daily. 04/23/24   Ahmed, Muhammad F, MD    Allergies: Aquacel [carboxymethylcellulose], Dextromethorphan, Nickel, Sulfonamide derivatives, Aspirin , Ciprofloxacin, Cosyntropin, Amoxicillin , Diclofenac sodium, Latex, Moxifloxacin, Nsaids, and Tape    Review of Systems  Updated Vital Signs BP 134/71   Pulse 86   Temp 98.2 F (36.8 C) (Oral)   Resp 17   Ht 5' 4 (1.626 m)   Wt 69 kg   SpO2 100%   BMI 26.11 kg/m   Physical Exam  (all labs ordered are listed, but only abnormal results are displayed) Labs Reviewed - No data to display  EKG: None  Radiology: No results found.  {Document cardiac monitor, telemetry assessment procedure when appropriate:32947} Procedures   Medications Ordered in the ED  cefTRIAXone (ROCEPHIN) 2 g in sodium chloride  0.9 % 100 mL IVPB (0 g Intravenous Stopped 08/01/24 2252)      {Click here for ABCD2, HEART and other calculators REFRESH Note before signing:1}                              Medical Decision Making  ***  {Document critical care time when appropriate  Document review of labs and clinical decision tools ie CHADS2VASC2, etc  Document your independent review of radiology images and any outside records  Document your discussion with family members, caretakers and with consultants  Document social determinants of health affecting pt's care  Document your decision making why  or why not admission, treatments were needed:32947:::1}   Final diagnoses:  None    ED Discharge Orders     None

## 2024-08-01 NOTE — ED Triage Notes (Signed)
 Pt to ED from home with c/o occluded midline cath. Pt had a catheter placed for recent hospitalization for a kidney stone and aggressive UTI. Pt states she last used the line yesterday at 4pm without issue. Unable to flush at all today or draw back. Was advised to come to the ED. Attempted to flush in triage by this RN without success. States the site is a little painful (3/10). VSS.

## 2024-08-01 NOTE — ED Notes (Signed)
 2.5 mL heparin  pushed into midline per IV team protocol after anbx completed.

## 2024-08-01 NOTE — Progress Notes (Signed)

## 2024-08-04 ENCOUNTER — Other Ambulatory Visit (INDEPENDENT_AMBULATORY_CARE_PROVIDER_SITE_OTHER): Payer: Self-pay | Admitting: Gastroenterology

## 2024-08-04 DIAGNOSIS — E7431 Sucrase-isomaltase deficiency: Secondary | ICD-10-CM

## 2024-08-04 MED ORDER — SUCRAID 8500 UNIT/ML PO SOLN: 2.0000 mL | Freq: Three times a day (TID) | ORAL | 2 refills | Status: DC

## 2024-08-05 ENCOUNTER — Ambulatory Visit: Payer: Self-pay | Admitting: Surgery

## 2024-08-05 ENCOUNTER — Telehealth: Payer: Self-pay

## 2024-08-05 NOTE — Telephone Encounter (Signed)
 Elizabeth with [facility] called stating she seen patient today to remove mid line and patient is complaining of pain with urination and hematuria.  Almarie wanted to hold off on removing the midline until the patient was seen in the office. Orders given to leave midline in until patient is seen. Patient also advised if symptoms worsen go to the ED. Patient also has also reached out to her urologist. Patient scheduled for a sooner  tomorrow with Dr. Dennise to determine if she will need additional IV abx.

## 2024-08-05 NOTE — Patient Instructions (Addendum)
 SURGICAL WAITING ROOM VISITATION  Patients having surgery or a procedure may have no more than 2 support people in the waiting area - these visitors may rotate.    Children under the age of 55 must have an adult with them who is not the patient.  Visitors with respiratory illnesses are discouraged from visiting and should remain at home.  If the patient needs to stay at the hospital during part of their recovery, the visitor guidelines for inpatient rooms apply. Pre-op nurse will coordinate an appropriate time for 1 support person to accompany patient in pre-op.  This support person may not rotate.    Please refer to the [facility] website for the visitor guidelines for Inpatients (after your surgery is over and you are in a regular room).       Your procedure is scheduled on: 08-13-24   Report to [facility] Main Entrance    Report to admitting at      0915 AM   Call this number if you have problems the morning of surgery [phone]   Do not eat food   OR DRINK LIQUIDS :After Midnight.              If you have questions, please contact your surgeon's office.   FOLLOW ANY ADDITIONAL PRE OP INSTRUCTIONS YOU RECEIVED FROM YOUR SURGEON'S OFFICE!!!     Oral Hygiene is also important to reduce your risk of infection.                                    Remember - BRUSH YOUR TEETH THE MORNING OF SURGERY WITH YOUR REGULAR TOOTHPASTE  DENTURES WILL BE REMOVED PRIOR TO SURGERY PLEASE DO NOT APPLY Poly grip OR ADHESIVES!!!   Do NOT smoke after Midnight   Stop all vitamins and herbal supplements 7 days before surgery.   Take these medicines the morning of surgery with A SIP OF WATER : Ursodiol , Prevastatin, Pantoprazole , oxycodone  if needed, eye drops as usual, nasal spray, amlodipine , bring rescue inhaler with you    Bring CPAP mask and tubing day of surgery.                              You may not have any metal on your body including hair pins, jewelry, and body  piercing             Do not wear make-up, lotions, powders, perfumes/cologne, or deodorant  Do not wear nail polish including gel and S&S, artificial/acrylic nails, or any other type of covering on natural nails including finger and toenails. If you have artificial nails, gel coating, etc. that needs to be removed by a nail salon please have this removed prior to surgery or surgery may need to be canceled/ delayed if the surgeon/ anesthesia feels like they are unable to be safely monitored.   Do not shave  48 hours prior to surgery.          Do not bring valuables to the hospital. [facility] IS NOT             RESPONSIBLE   FOR VALUABLES.   Contacts, glasses, dentures or bridgework may not be worn into surgery.   Bring small overnight bag day of surgery.   DO NOT BRING YOUR HOME MEDICATIONS TO THE HOSPITAL. PHARMACY WILL DISPENSE MEDICATIONS LISTED ON YOUR MEDICATION LIST  TO YOU DURING YOUR ADMISSION IN THE HOSPITAL!    Patients discharged on the day of surgery will not be allowed to drive home.  Someone NEEDS to stay with you for the first 24 hours after anesthesia.   Special Instructions: Bring a copy of your healthcare power of attorney and living will documents the day of surgery if you haven't scanned them before.              Please read over the following fact sheets you were given: IF YOU HAVE QUESTIONS ABOUT YOUR PRE-OP INSTRUCTIONS PLEASE CALL [phone].   . If you test positive for Covid or have been in contact with anyone that has tested positive in the last 10 days please notify you surgeon.     - Preparing for Surgery Before surgery, you can play an important role.  Because skin is not sterile, your skin needs to be as free of germs as possible.  You can reduce the number of germs on your skin by washing with CHG (chlorahexidine gluconate) soap before surgery.  CHG is an antiseptic cleaner which kills germs and bonds with the skin to continue killing germs even  after washing. Please DO NOT use if you have an allergy to CHG or antibacterial soaps.  If your skin becomes reddened/irritated stop using the CHG and inform your nurse when you arrive at Short Stay. Do not shave (including legs and underarms) for at least 48 hours prior to the first CHG shower.  You may shave your face/neck.  Please follow these instructions carefully:  1.  Shower with CHG Soap the night before surgery and the morning of surgery.  2.  If you choose to wash your hair, wash your hair first as usual with your normal  shampoo.  3.  After you shampoo, rinse your hair and body thoroughly to remove the shampoo.                             4.  Use CHG as you would any other liquid soap.  You can apply chg directly to the skin and wash.  Gently with a scrungie or clean washcloth.  5.  Apply the CHG Soap to your body ONLY FROM THE NECK DOWN.   Do  not use on face/ open                           Wound or open sores. Avoid contact with eyes, ears mouth and  genitaLs (private parts).                       Wash face,  Genitals (private parts) with your normal soap.             6.  Wash thoroughly, paying special attention to the area where your   surgery  will be performed.  7.  Thoroughly rinse your body with warm water  from the neck down.  8.  DO NOT shower/wash with your normal soap after using and rinsing off the CHG Soap.                9.  Pat yourself dry with a clean towel.            10.  Wear clean pajamas.            11.  Place clean sheets on your bed the  night of your first shower and do not  sleep with pets. Day of Surgery : Do not apply any CHG, lotions/deodorants the morning of surgery.  Please wear clean clothes to the hospital/surgery center.  FAILURE TO FOLLOW THESE INSTRUCTIONS MAY RESULT IN THE CANCELLATION OF YOUR SURGERY  PATIENT SIGNATURE_________________________________  NURSE  SIGNATURE__________________________________  ________________________________________________________________________

## 2024-08-05 NOTE — Progress Notes (Signed)
 PATIENT NAME: [name] MRN: [id number] DOB: December 07, 1960 PHYSICIANS:  REFERRING PHYSICIAN:  Cinderella Deatrice FALCON, MD  CARE TEAM:   Patient Care Team: Debrah Josette ORN, PA as PCP - General (Family Medicine) Ahmed, Deatrice FALCON, MD (Gastroenterology) Ziolkowska, Aldona, MD as Referring Physician (Rheumatology) Carolee Sherwood Pernell DOUGLAS, MD as Referring Physician (Urology)  CONSULTING PROVIDER:  ELSPETH JUDAH SCHULTZE, MD     SUBJECTIVE   Chief Complaint: New Consultation (- anal lesion)    [name] is a 63 y.o. female  who is seen today as an office consultation  at the request of DrRONITA Cinderella  for evaluation of anal lesion.  History of Present Illness:  63 year old woman with numerous medical issues.  History of stroke/TIA with no residual symptoms..  COPD.  Epilepsy.  Rheumatoid arthritis.  She was on an immunomodulators but due to liver issues is off that now.  Has had some intermittent prednisone.  PBC treated with ursodiol .   She was admitted earlier this year with diarrhea and inflammation.  Seen by gastrology with upper and lower endoscopies.  No upper GI issues with negative celiac panel and gastrin level.  She had some concerning of left-sided colitis but biopsies were negative for any infectious or inflammatory etiology.  Patient tends to have constipation with intermittent overflow diarrhea.  At some improved with Linzess  but due to insurance issues with switch to Amitiza .  That seems to be working okay.  Has not been as compliant on fiber supplement such as Metamucil or MiraLAX  due to intolerance.  It is hard to have regular soft bowel movements.  Patient notes there is been a lesion that pops out at the anus.  Can cause discomfort.  Surgical consultation requested.  Patient had had a hernia repair by Dr. Vanderbilt in 2013 so sent to our group.  She had been open cholecystectomy hysterectomy and some hernia repairs.    Main concern is a lesion prolapsing out.  She notes  it is painful and frustrating at times.  She notes sometimes it is a challenge to move her bowels and defecate.  She thinks it is related to the hysterectomy.  She can walk about 10 minutes before she has to stop.  Had some chest pain but had a cardiac workup including a negative cardiac catheterization in 2023.  Echocardiogram 2022 with normal ejection fraction and no major concerns.  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  No diabetes.  Used to smoke but quit 25 years ago.  She has not any prior anorectal interventions.  Recently she has had some kidney stone issues and is due to get some lithotripsy intervention.   Medical History:  Past Medical History:  Diagnosis Date  . Anxiety   . Arrhythmia   . Arthritis   . Asthma, unspecified asthma severity, unspecified whether complicated, unspecified whether persistent (HHS-HCC)   . COPD (chronic obstructive pulmonary disease) (CMS/HHS-HCC)   . GERD (gastroesophageal reflux disease)   . History of stroke   . Liver disease   . Seizures (CMS/HHS-HCC)   . Sleep apnea     Patient Active Problem List  Diagnosis  . Mass of anus  . Irritable bowel syndrome with both constipation and diarrhea  . Multiple drug allergies  . Prolapsed internal hemorrhoids, grade 2    History reviewed. No pertinent surgical history.   Allergies  Allergen Reactions  . Adalimumab Itching    Red, Swelling.  . Carboxymethylcellulose Other (See Comments)  Blisters  . Dextromethorphan Anaphylaxis  . Nickel Other (See Comments) and Rash    Turn read  . Sulfa (Sulfonamide Antibiotics) Anaphylaxis, Hives and Swelling    Swelling of tongue    Other Reaction(s): Unknown  . Aspirin  Other (See Comments) and Nausea And Vomiting    REACTION: severe gi upset.  Pt states she can tolerate 81 mg asa only.   REACTION: severe gi upset.  Pt states she can tolerate 81 mg asa only.    Severe GI upset.  Pt states she can  tolerate 81 mg asa only.  Severe GI upset.  Pt states she can tolerate 81 mg asa only.  . Ciprofloxacin Itching and Swelling    REACTION: angioedema, urticaria  Angioedema, urticaria  . Cosyntropin Hives and Swelling    REACTION: swelling, hives  . Methotrexate  Analogues Diarrhea  . Adhesive Other (See Comments)    Blisters skin  . Amoxicillin -Pot Clavulanate Other (See Comments)  . Lanolin Other (See Comments)    other  . Amoxicillin  Nausea And Vomiting    REACTION: GI upset  GI upset  . Diclofenac Sodium Dermatitis and Rash    Gel causes legs to break out  . Guaifenesin Rash    REACTION: head rash  . Latex Other (See Comments) and Rash    other    Blisters  other, , Blisters    Other Reaction(s): Unknown  Blisters  . Moxifloxacin Other (See Comments)    gi upset  GI upset  . Tolmetin Other (See Comments)    Gi upset    Current Outpatient Medications on File Prior to Visit  Medication Sig Dispense Refill  . albuterol  MDI, PROVENTIL , VENTOLIN , PROAIR , HFA 90 mcg/actuation inhaler Inhale 1-2 inhalations into the lungs every 6 (six) hours as needed for Wheezing or Shortness of Breath    . ALPRAZolam  (XANAX ) 0.5 MG tablet Take 0.5 mg by mouth at bedtime    . amLODIPine  (NORVASC ) 5 MG tablet Take 5 mg by mouth once daily    . aspirin  81 MG EC tablet Take 81 mg by mouth at bedtime    . cefTRIAXone (ROCEPHIN) 10 gram injection     . dicyclomine  (BENTYL ) 10 mg capsule Take 10 mg by mouth    . hydrOXYzine  HCL (ATARAX ) 10 MG tablet Take 10 mg by mouth    . hyoscyamine  (LEVSIN /SL) 0.125 mg SL tablet Place 0.125 mg under the tongue    . ipratropium (ATROVENT ) 21 mcg (0.03 %) nasal spray USE 2 SPRAYS IN BOTH NOSTRILS  TWICE DAILY    . loratadine  (CLARITIN ) 10 mg tablet Take 10 mg by mouth at bedtime    . lubiprostone  (AMITIZA ) 24 MCG capsule Take 24 mcg by mouth    . nitroGLYcerin  (NITROSTAT ) 0.4 MG SL tablet Place 0.4 mg under the tongue every 5 (five) minutes as needed for  Chest pain    . ondansetron  (ZOFRAN ) 4 MG tablet Take 4 mg by mouth every 8 (eight) hours as needed    . oxyCODONE  (ROXICODONE ) 5 MG immediate release tablet Take 5 mg by mouth every 4 (four) hours as needed    . pantoprazole  (PROTONIX ) 40 MG DR tablet Take 40 mg by mouth once daily    . ursodioL  (ACTIGALL ) 300 mg capsule Take 300 mg by mouth 3 (three) times daily     No current facility-administered medications on file prior to visit.    Family History  Problem Relation Age of Onset  . Obesity Father   .  High blood pressure (Hypertension) Father   . Coronary Artery Disease (Blocked arteries around heart) Father   . Stroke Brother   . Skin cancer Brother   . Obesity Brother   . High blood pressure (Hypertension) Brother   . Diabetes Brother      Social History   Tobacco Use  Smoking Status Never  Smokeless Tobacco Never     Social History   Socioeconomic History  . Marital status: Married  Tobacco Use  . Smoking status: Never  . Smokeless tobacco: Never  Vaping Use  . Vaping status: Unknown  Substance and Sexual Activity  . Alcohol use: Never  . Drug use: Never   Social Drivers of Corporate investment banker Strain: Medium Risk (05/18/2021)   Received from [facility] visits prior to 12/16/2022.   Overall Physicist, medical Strain (CARDIA)   . Difficulty of Paying Living Expenses: Somewhat hard  Food Insecurity: Low Risk  (07/28/2024)   Received from [facility]   Hunger Vital Sign   . Within the past 12 months, you worried that your food would run out before you got money to buy more: Never true   . Within the past 12 months, the food you bought just didn't last and you didn't have money to get more. : Never true  Transportation Needs: No Transportation Needs (07/28/2024)   Received from [facility]   . In the past 12 months, has lack of reliable transportation kept you from medical appointments, meetings, work or from  getting things needed for daily living? : No  Physical Activity: Inactive (05/18/2021)   Received from [facility] visits prior to 12/16/2022.   Exercise Vital Sign   . On average, how many days per week do you engage in moderate to strenuous exercise (like a brisk walk)?: 0 days   . On average, how many minutes do you engage in exercise at this level?: 0 min  Stress: Stress Concern Present (05/18/2021)   Received from [facility] visits prior to 12/16/2022.   [employer] of Occupational Health - Occupational Stress Questionnaire   . Feeling of Stress : Very much  Social Connections: Moderately Integrated (07/22/2024)   Received from [facility]   Social Connection and Isolation Panel   . In a typical week, how many times do you talk on the phone with family, friends, or neighbors?: More than three times a week   . How often do you get together with friends or relatives?: More than three times a week   . How often do you attend church or religious services?: More than 4 times per year   . Do you belong to any clubs or organizations such as church groups, unions, fraternal or athletic groups, or school groups?: No   . How often do you attend meetings of the clubs or organizations you belong to?: Never   . Are you married, widowed, divorced, separated, never married, or living with a partner?: Married  Housing Stability: Unknown (08/05/2024)   Housing Stability Vital Sign   . Homeless in the Last Year: No    ############################################################  Review of Systems: A complete review of systems (ROS) was obtained from the patient.   We have reviewed this information and discussed as appropriate with the patient.   See HPI as well for other pertinent ROS.  Constitutional:  No fevers, chills, sweats.  Weight stable Eyes:  No vision changes, No  discharge HENT:  No sore throats, nasal drainage Lymph: No neck swelling, No  bruising easily Pulmonary:  No cough, productive sputum CV: No orthopnea, PND . No exertional chest/neck/shoulder/arm pain.  Patient can walk 10 minutes gradually.    GI: No personal nor family history of GI/colon cancer, inflammatory bowel disease, irritable bowel syndrome, allergy such as Celiac Sprue, dietary/dairy problems, colitis, ulcers nor gastritis.  No recent sick contacts/gastroenteritis.  No travel outside the country.  No changes in diet.  Renal: No UTIs, No hematuria.  Recurrent kidney stones. Genital:  No drainage, bleeding, masses Musculoskeletal: Some joint issues chronic. no severe acute joint pain.  OK ROM major joints Skin:  No sores or lesions Heme/Lymph:  No easy bleeding.  No swollen lymph nodes Neuro:  No active seizures.  No facial droop Psych:  No hallucinations.  No agitation  OBJECTIVE   Vitals:   08/05/24 1109  BP: (!) 141/81  Pulse: 77  Resp: 16  Temp: 36.4 C (97.6 F)  SpO2: 98%  Weight: 67.1 kg (148 lb)  Height: 162.6 cm (5' 4)  PainSc:   5    Body mass index is 25.4 kg/m.  PHYSICAL EXAM:   Constitutional: Not cachectic.  Hygeine adequate.  Vitals signs as above.   Eyes: No glasses.  Vision adequate,Pupils reactive, normal extraocular movements. Sclera nonicteric Neuro: CN II-XII intact.  No major focal sensory defects.  No major motor deficits. Lymph: No head/neck/groin lymphadenopathy Psych:  No severe agitation.  No severe anxiety.  Judgment & insight Adequate, Oriented x4, HENT: Normocephalic, Mucus membranes moist.  No thrush.  Hearing: adequate Neck: Supple, No tracheal deviation.  No obvious thyromegaly Chest: No pain to chest wall compression.  Good respiratory excursion.  No audible wheezing CV:  Pulses intact.  regular.  No major extremity edema Ext: No obvious deformity or contracture.  Edema: Not present.  No cyanosis Skin: No major subcutaneous nodules.  Warm and dry Musculoskeletal: Severe joint rigidity not present.  No  obvious clubbing.  No digital petechiae.  Mobility: no assist device moves with minimal assistance  Abdomen:  Obese Soft.   Nondistended.  Nontender.    Hernia: Not present. Diastasis recti: Not present.  No hepatomegaly.  No splenomegaly.  Genital/Pelvic:  Inguinal hernia: Not present.  Inguinal lymph nodes: without lymphadenopathy nor hidradenitis.  No vaginal bleeding or discharge  Rectal:   PE Chaperone note: Russell Christine, CMA, was included in the room as chaperone for sensitive portions of the exam   Perianal skin Clean with good hygiene  Pruritis ani: Not present Pilonidal disease: Not present  External hemorrhoids: Some redundant anal verge tissue but no major distinct hemorrhoid. Condyloma / warts: Not present Anal fissure: Not present Perirectal abscess/fistula: Not present Sphincter tone: Normal with anal spasming  Digital and anoscopic rectal exam: Barely tolerated   Hemorrhoidal piles: Some swelling of hemorrhoids at least grade 2 especially right posterior. Prostate:  N/A Rectovaginal septum: Intact with normal thickness & no rectocele Rectal masses: On the left anterior aspect is a pedunculated smooth mass consistent with anal crypt type polyp.  He prolapse pretty easily.  It is sensitive.  He does not seem frankly verrucous.     ###################################  ###################################################################  Labs, Imaging and Diagnostic Testing:  Located in 'Care Everywhere' section of Epic EMR chart   PRIOR [facility] NOTES:  Not applicable   SURGERY NOTES:  Not applicable   PATHOLOGY:  Not applicable    Assessment and Plan:  DIAGNOSES:  Diagnoses  and all orders for this visit:  Mass of anus  Irritable bowel syndrome with both constipation and diarrhea  Multiple drug allergies  Prolapsed internal hemorrhoids, grade 2     ASSESSMENT/PLAN  Pleasant woman with unfortunately numerous medical issues being  addressed by numerous specialties including rheumatology, cardiology, neurology, gastroenterology, internal medicine.  Recent workup negative for any infectious or inflammatory colitis but with persistent IBS issues.  She does have a pedunculated mass that pops out.  Seems consistent with anal crypt polyp.  He does not have high suspicion malignancy but because it is painful and prolapses causes discomfort reasonable to consider surgery.  Outpatient removal.  Prone jackknife positioning.  Perhaps can do some hemorrhoidal ligation & pexy.  Probably try and hold off on being too aggressive in this woman with her other issues. The anatomy & physiology of the anorectal region was discussed.  The pathophysiology of hemorrhoids, prolapsing masses, and differential diagnosis was discussed.  Natural history risks without surgery was discussed.   I stressed the importance of a bowel regimen to have daily soft bowel movements to minimize progression of disease.  Interventions such as sclerotherapy & banding were discussed.  The patient's symptoms are not adequately controlled by medicines and other non-operative treatments.  I feel the risks & problems of no surgery outweigh the operative risks; therefore, I recommended surgery to treat the hemorrhoids by ligation, pexy, and possible resection.  Risks such as bleeding, infection, urinary difficulties, injury to other organs, need for repair of tissues / organs, need for further treatment, heart attack, death, and other risks were discussed.   I noted a good likelihood this will help address the problem.  Goals of post-operative recovery were discussed as well.  Possibility that this will not correct all symptoms was explained.  Post-operative pain, bleeding, constipation, and other problems after surgery were discussed.  We will work to minimize complications.   Educational handouts further explaining the pathology, treatment options, and bowel regimen were given as  well.  Questions were answered.  The patient expresses understanding & wishes to proceed with surgery.  I did caution that her irregular bowels and constipation diarrhea will definitely trigger anorectal issues and defecation problems.  Hopefully she can find a regimen with Amitiza  and fiber to stabilize the turbulence of her regular bowels and avoid future anorectal issues.  Defer to gastroenterology.  She does not have the best performance status but had a pretty extensive cardiac workup that was underwhelming just a couple of years ago and was cleared while she was in the hospital just a couple months ago by cardiology.  This is not a high risk surgery.  I think it safe to proceed with outpatient surgery.  This is someone who may benefit from pelvic or physical therapy in the future since she claims she is having some defecation problems.  I do not know if we need to do MRI Defacography or things like that just yet but may need to consider it.  I would wait 3 months after removal of this mass before considering that.  Also will give an opportunity for GI to find a promotility and fiber regimen to help with her IBS constipation diarrhea issues that are triggering a lot of these problems.  FOLLOWUP:  Return for WE RECOMMEND SURGERY - See instructions.  I have reviewed this patient's available data, including medical history, doctor notes, radiology & other studies, events of note, test results, physical exam, etc as part of my evaluation.  A significant portion of that time was spent in counseling in discussion of management, need for further testing, need for other medical or surgical input, etc.   Care was provided by me.  Based on my assessment, this care required MODERATE- Level 4 level of medical decision making.  08/05/2024   The plan was discussed in detail with the patient today, who expressed understanding & appreciation.  The patient has my contact information, and understands to call me  with any additional questions or concerns.  I & my group would be happy to see the patient back sooner if the need arises.  Of note, portions of this report may have been transcribed using voice recognition software. Effort was made to ensure accuracy; however, inadvertent computerized transcription errors with sound-a-like substitutions may be present.   Any transcriptional errors that result from this process are unintentional.  ########################################################   Elspeth KYM Schultze, MD, FACS, MASCRS Esophageal, Gastrointestinal & Colorectal Surgery Robotic and Minimally Invasive Surgery  [facility]  [address] [place], [place] [address] [phone] Fax [phone] [address]              08/05/2024

## 2024-08-05 NOTE — Progress Notes (Addendum)
 PCP - Allean Aho, PA-C   LOV 06-10-24 epic Cardiologist - LOV 04-14-24 Ester Beauvais, NP  PPM/ICD -  Device Orders -  Rep Notified -   Chest x-ray - 1V 07-23-24 epic EKG - 04-14-24 epic Stress Test - 2022 epic ECHO - 2022 epic Cardiac Cath - 07-25-22 epic CT coronary- Sleep Study -  CPAP -   Fasting Blood Sugar -  Checks Blood Sugar _____ times a day  Blood Thinner Instructions: Aspirin  Instructions:81 mg asa hold 5 days  ERAS Protcol - PRE-SURGERY Ensure or G2-   COVID TEST-  COVID vaccine -  Activity-- Anesthesia review:PFO, [place] 2014, RA , OSA, Seizures, Asthma, COPD  Patient denies shortness of breath, fever, cough and chest pain at PAT appointment   All instructions explained to the patient, with a verbal understanding of the material. Patient agrees to go over the instructions while at home for a better understanding. Patient also instructed to self quarantine after being tested for COVID-19. The opportunity to ask questions was provided.

## 2024-08-05 NOTE — Progress Notes (Signed)
 [facility] Population Health post TCM follow up 10 day  Date of call: 08/05/24  Discharged from: [facility]  Updates/Changes since last encounter: ED visit 08/01/24 to have IV catheter replaced.  Current Questions/Concerns: Spoke with pt reports having blood in urine has called urology and is awaiting callback with further recommendations. Pt reports having completed IV antibiotics HH RN is coming today to remove line. Pt reports will have stone surgery and stent replaced 10/29. Pt denies any needs for HN at time of call. Pt aware of s/s to seek immediate care/911/ED.  Is patient candidate for Navigation: yes  Ellouise Quale, RN CHESS Navigator [phone]   Electronically signed by: Ellouise CHRISTELLA Quale, RN 08/05/2024 2:28 PM

## 2024-08-06 ENCOUNTER — Encounter: Payer: Self-pay | Admitting: Internal Medicine

## 2024-08-06 ENCOUNTER — Other Ambulatory Visit: Payer: Self-pay

## 2024-08-06 ENCOUNTER — Encounter (HOSPITAL_COMMUNITY)
Admission: RE | Admit: 2024-08-06 | Discharge: 2024-08-06 | Disposition: A | Discharge status: Home / Self Care | Source: Ambulatory Visit | Attending: Urology | Admitting: Urology

## 2024-08-06 ENCOUNTER — Other Ambulatory Visit (INDEPENDENT_AMBULATORY_CARE_PROVIDER_SITE_OTHER): Payer: Self-pay

## 2024-08-06 ENCOUNTER — Ambulatory Visit (INDEPENDENT_AMBULATORY_CARE_PROVIDER_SITE_OTHER): Admitting: Internal Medicine

## 2024-08-06 ENCOUNTER — Encounter (HOSPITAL_COMMUNITY): Payer: Self-pay

## 2024-08-06 VITALS — BP 136/76 | HR 60 | Temp 98.9°F | Ht 64.5 in | Wt 149.0 lb

## 2024-08-06 VITALS — BP 132/70 | Temp 98.6°F | Resp 16 | Ht 64.0 in | Wt 146.8 lb

## 2024-08-06 DIAGNOSIS — Z87891 Personal history of nicotine dependence: Secondary | ICD-10-CM | POA: Insufficient documentation

## 2024-08-06 DIAGNOSIS — N390 Urinary tract infection, site not specified: Secondary | ICD-10-CM | POA: Diagnosis not present

## 2024-08-06 DIAGNOSIS — Z01818 Encounter for other preprocedural examination: Secondary | ICD-10-CM | POA: Diagnosis present

## 2024-08-06 DIAGNOSIS — N201 Calculus of ureter: Secondary | ICD-10-CM | POA: Insufficient documentation

## 2024-08-06 DIAGNOSIS — R748 Abnormal levels of other serum enzymes: Secondary | ICD-10-CM | POA: Insufficient documentation

## 2024-08-06 DIAGNOSIS — I69354 Hemiplegia and hemiparesis following cerebral infarction affecting left non-dominant side: Secondary | ICD-10-CM | POA: Diagnosis not present

## 2024-08-06 DIAGNOSIS — G4730 Sleep apnea, unspecified: Secondary | ICD-10-CM | POA: Insufficient documentation

## 2024-08-06 DIAGNOSIS — E7431 Sucrase-isomaltase deficiency: Secondary | ICD-10-CM

## 2024-08-06 DIAGNOSIS — A419 Sepsis, unspecified organism: Secondary | ICD-10-CM | POA: Diagnosis not present

## 2024-08-06 DIAGNOSIS — J449 Chronic obstructive pulmonary disease, unspecified: Secondary | ICD-10-CM | POA: Insufficient documentation

## 2024-08-06 DIAGNOSIS — K219 Gastro-esophageal reflux disease without esophagitis: Secondary | ICD-10-CM | POA: Insufficient documentation

## 2024-08-06 HISTORY — DX: Primary biliary cirrhosis: K74.3

## 2024-08-06 HISTORY — DX: Unspecified osteoarthritis, unspecified site: M19.90

## 2024-08-06 HISTORY — DX: Sucrase-isomaltase deficiency: E74.31

## 2024-08-06 HISTORY — DX: Arthropathic psoriasis, unspecified: L40.50

## 2024-08-06 LAB — COMPREHENSIVE METABOLIC PANEL WITH GFR
ALT: 12 U/L (ref 0–44)
AST: 21 U/L (ref 15–41)
Albumin: 3.8 g/dL (ref 3.5–5.0)
Alkaline Phosphatase: 109 U/L (ref 38–126)
Anion gap: 9 (ref 5–15)
BUN: 8 mg/dL (ref 8–23)
CO2: 22 mmol/L (ref 22–32)
Calcium: 9.5 mg/dL (ref 8.9–10.3)
Chloride: 111 mmol/L (ref 98–111)
Creatinine, Ser: 0.49 mg/dL (ref 0.44–1.00)
GFR, Estimated: >60 mL/min (ref >60)
Glucose, Bld: 104 mg/dL — ABNORMAL HIGH (ref 70–99)
Potassium: 4.2 mmol/L (ref 3.5–5.1)
Sodium: 142 mmol/L (ref 135–145)
Total Bilirubin: 0.3 mg/dL (ref 0.0–1.2)
Total Protein: 7 g/dL (ref 6.5–8.1)

## 2024-08-06 MED ORDER — SUCRAID 8500 UNIT/ML PO SOLN: 2.0000 mL | Freq: Three times a day (TID) | ORAL | 2 refills | Status: DC

## 2024-08-06 NOTE — Progress Notes (Signed)
 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   Arthritis of carpometacarpal (CMC) joint of both thumbs 05/02/2024   Abnormal CT of the abdomen 03/20/2024   Dehydration 03/20/2024   Hypokalemia 03/20/2024   Nausea and vomiting 03/20/2024   Intractable nausea and vomiting 03/19/2024   Generalized postprandial abdominal pain 03/13/2024   Loss of weight 03/13/2024   History of colitis 03/13/2024   Hemorrhagic colitis 02/26/2024   Rectal bleeding 02/26/2024   Primary biliary cirrhosis (HCC) 11/26/2023   Right lower quadrant abdominal pain 11/26/2023   Right upper quadrant abdominal pain 11/26/2023   Antimitochondrial antibody positive 10/23/2023   Chronic idiopathic constipation 10/23/2023   Gastritis and gastroduodenitis 07/19/2023   Gastric polyp 07/19/2023   Dyspepsia 07/10/2023   Elevated liver enzymes 07/10/2023   Overflow diarrhea 07/10/2023   Metabolic dysfunction-associated steatotic liver disease (MASLD) 07/10/2023   Precordial pain    Insomnia    COPD (chronic obstructive pulmonary disease) (HCC) 05/14/2020   Ventricular septal defect 03/28/2016   Postoperative anemia due to acute blood loss 02/08/2016   Primary localized osteoarthritis of left knee    Primary localized osteoarthritis of right knee 11/03/2015   DJD (degenerative joint disease) of knee 11/03/2015   Rheumatoid arthritis (HCC)    PONV (postoperative nausea and vomiting)    Seizures (HCC)    Fibromyalgia    History of staph infection    Complex partial epilepsy (HCC)  08/10/2014   S/P tonsillectomy and adenoidectomy 04/01/2014   Panic anxiety syndrome 06/03/2013   Positive depression screening 06/03/2013   Headache 06/03/2013   Cerebral artery occlusion with cerebral infarction (HCC) 06/03/2013   Patent foramen ovale 05/29/2013   Dysphagia 04/17/2013   Persistent headaches 03/26/2013   Cerebral infarction (HCC) 03/26/2013   Leukocytosis 03/26/2013   Arthritis 03/26/2013   QT prolongation 03/26/2013   History of Stroke 03/26/2013   Recurrent ventral incisional hernia 04/19/2012   Obstructive sleep apnea 08/22/2010   RESTLESS LEGS SYNDROME 08/22/2010   Allergic asthma, moderate persistent, uncomplicated 08/22/2010   ECZEMA 08/22/2010   Irritable bowel syndrome 06/23/2010   DIARRHEA 06/23/2010   KNEE, ARTHRITIS, DEGEN./OSTEO 11/18/2008     Subjective:  [name] is a 63 y.o. female with past medical history of COPD, diverticulitis, GERD, renal stones, anxiety/depression, insomnia, IBS, PFO, rheumatoid arthritis on Orencia presents for management of E. coli bacteremia with complicated UTI secondary to obstructive uropathy.  Patient was admitted with flank pain found to have E. coli bacteremia started on ceftriaxone.  CT renal study showed calculus in the left proximal ureter near UPJ, nonobstructive 5 mm calculus in inferior pole of left kidney.  She underwent cystoscopy on 10/7 with stent placement.  Discharged on ceftriaxone x 2 weeks.  She had midline issue and was replaced on 10/17.  Review of Systems  All other systems reviewed and are negative.   Past Medical History:  Diagnosis Date   Allergic rhinitis    uses Flonase  daily as needed and takes CLaritin  daily   Anxiety    takes Xanax  daily as  needed   Asthma    Albuterol  inhaler prn;SYmbicort  daily   Cerebral vascular malformation    sees dr mavis for monitoring as needed, sees dr lewitt for headaches every 4 months   Congenital sucrase-isomaltase deficiency    COPD (chronic  obstructive pulmonary disease) (HCC)    Depression    takes Citalopram  daily   Diverticulitis at age 44   Eczema    GERD (gastroesophageal reflux disease)    takes Protonix  daily   Hard of hearing    History of kidney stones    History of migraine    last one 10+yrs ago   History of staph infection 56yrs ago   Hyperlipidemia    takes Pravastatin  daily   IBS (irritable bowel syndrome)    mixed   Insomnia    Joint pain    Lactose intolerance    Nausea    takes Zofran  daily as needed   Osteoarthritis    PFO (patent foramen ovale)    PONV (postoperative nausea and vomiting)    Postoperative anemia due to acute blood loss 02/08/2016   Primary biliary cholangitis (HCC)    Primary localized osteoarthritis of left knee    Primary localized osteoarthritis of right knee 11/03/2015   Psoriatic arthritis (HCC)    Rheumatoid arthritis(714.0) 10/16/2008   oa and ra;Rhemicade IV every 6wks and Metotrexate weekly   Seizures (HCC) 6months ago 03/21/14   takes Depakote  daily   Shortness of breath    with exertion   Sleep apnea    study done >75yrs ago; trying to get a new machine   Spinal headache    patient states that she thinks she had a spinal headache a long time ago   Stress incontinence    Stroke (HCC) 03/26/2013   left sided weakness   Thyroid  cyst     Outpatient Medications Prior to Visit  Medication Sig Dispense Refill   albuterol  (VENTOLIN  HFA) 108 (90 Base) MCG/ACT inhaler Inhale 1-2 puffs into the lungs every 6 (six) hours as needed for wheezing or shortness of breath.     ALPRAZolam  (XANAX ) 0.5 MG tablet Take 0.5 mg by mouth at bedtime.     amLODipine  (NORVASC ) 5 MG tablet Take 1 tablet (5 mg total) by mouth daily. 90 tablet 3   aspirin  EC 81 MG tablet Take 81 mg by mouth at bedtime.     dicyclomine  (BENTYL ) 10 MG capsule Take 1 capsule (10 mg total) by mouth 4 (four) times daily -  before meals and at bedtime. (Patient taking differently: Take 10 mg by mouth 4 (four) times  daily.) 120 capsule 0   hydrOXYzine  (ATARAX ) 10 MG tablet Take 1 tablet (10 mg total) by mouth at bedtime as needed and may repeat dose one time if needed. 30 tablet 0   hyoscyamine  (LEVSIN  SL) 0.125 MG SL tablet Place 1 tablet (0.125 mg total) under the tongue every 6 (six) hours as needed. 60 tablet 1   ipratropium (ATROVENT ) 0.03 % nasal spray Place 2 sprays into the nose in the morning and at bedtime.     loratadine  (CLARITIN ) 10 MG tablet Take 10 mg by mouth at bedtime.     lubiprostone  (AMITIZA ) 24 MCG capsule Take 1 capsule (24 mcg total) by mouth 2 (two) times daily with a meal. 60 capsule 5   nitroGLYCERIN  (NITROSTAT ) 0.4 MG SL tablet Place 1 tablet (0.4 mg total) under the tongue every 5 (five) minutes as needed for chest pain. 25 tablet 3  OVER THE COUNTER MEDICATION Bio True eye drops daily.     oxyCODONE  (OXY IR/ROXICODONE ) 5 MG immediate release tablet Take 1 tablet (5 mg total) by mouth every 4 (four) hours as needed for severe pain (pain score 7-10). 20 tablet 0   pantoprazole  (PROTONIX ) 40 MG tablet TAKE 1 TABLET BY MOUTH DAILY 100 tablet 2   pravastatin  (PRAVACHOL ) 40 MG tablet Take 40 mg by mouth daily.     PREBIOTIC PRODUCT PO Take by mouth at bedtime.     PRESCRIPTION MEDICATION In Cholesterol study. Has injection once a month.has been in study for 3 years as of 12/07/23 and has 6 months to left in study. ( Will finish around end of August 2025 )     rizatriptan (MAXALT) 5 MG tablet Take 5 mg by mouth as needed for migraine. (not more than 3 a week). May repeat in 2 hours if needed     Sacrosidase  (SUCRAID ) 8500 UNIT/ML SOLN Take 2 mLs (17,000 Units total) by mouth with breakfast, with lunch, and with evening meal. Administer half the dose at the beginning of th meal or snack and other half during the meals 180 mL 2   tiZANidine (ZANAFLEX) 4 MG tablet Take 1 tablet by mouth every 6 (six) hours as needed for muscle spasms.     topiramate  (TOPAMAX ) 100 MG tablet Take 1 tablet (100  mg total) by mouth at bedtime. 30 tablet 0   ursodiol  (ACTIGALL ) 300 MG capsule Take 1 capsule (300 mg total) by mouth 3 (three) times daily. 90 capsule 5   No facility-administered medications prior to visit.     Allergies  Allergen Reactions   Aquacel [Carboxymethylcellulose] Other (See Comments)    Blisters    Dextromethorphan Anaphylaxis   Nickel Rash    Turn read   Sulfonamide Derivatives Anaphylaxis, Hives and Swelling    Swelling of tongue   Aspirin  Nausea And Vomiting    Severe GI upset.  Pt states she can tolerate 81 mg asa only.    Ciprofloxacin Itching and Swelling    Angioedema, urticaria   Cosyntropin Hives and Swelling   Amoxicillin  Nausea And Vomiting    GI upset   Diclofenac Sodium Dermatitis    Gel causes legs to break out   Latex Rash    Blisters   Moxifloxacin Other (See Comments)    GI upset   Nsaids Other (See Comments)    Gi upset   Tape Other (See Comments) and Dermatitis    Blisters skin    Social History   Tobacco Use   Smoking status: Former    Current packs/day: 0.00    Average packs/day: 2.0 packs/day for 30.0 years (60.0 ttl pk-yrs)    Types: Cigarettes    Start date: 10/19/1969    Quit date: 10/20/1999    Years since quitting: 24.8   Smokeless tobacco: Never  Vaping Use   Vaping status: Never Used  Substance Use Topics   Alcohol use: Not Currently    Comment: occ   Drug use: No    Family History  Problem Relation Age of Onset   Ovarian cancer Mother    Diabetes Maternal Grandmother    Arthritis Maternal Grandmother    Diabetes Brother    Diabetes Paternal Grandmother    Diabetes Maternal Grandfather    Diabetes Paternal Grandfather    Heart disease Brother    Heart disease Other        Uncle   Breast cancer Maternal Aunt  Objective:   Vitals:   08/06/24 1546  BP: 136/76  Pulse: 60  Temp: 98.9 F (37.2 C)  TempSrc: Oral  SpO2: 99%  Weight: 149 lb (67.6 kg)  Height: 5' 4.5 (1.638 m)   Body mass index is  25.18 kg/m.  Physical Exam Constitutional:      Appearance: Normal appearance.  HENT:     Head: Normocephalic and atraumatic.     Right Ear: Tympanic membrane normal.     Left Ear: Tympanic membrane normal.     Nose: Nose normal.     Mouth/Throat:     Mouth: Mucous membranes are moist.  Eyes:     Extraocular Movements: Extraocular movements intact.     Conjunctiva/sclera: Conjunctivae normal.     Pupils: Pupils are equal, round, and reactive to light.  Cardiovascular:     Rate and Rhythm: Normal rate and regular rhythm.     Heart sounds: No murmur heard.    No friction rub. No gallop.  Pulmonary:     Effort: Pulmonary effort is normal.     Breath sounds: Normal breath sounds.  Abdominal:     General: Abdomen is flat.     Palpations: Abdomen is soft.  Musculoskeletal:        General: Normal range of motion.  Skin:    General: Skin is warm and dry.  Neurological:     General: No focal deficit present.     Mental Status: She is alert and oriented to person, place, and time.  Psychiatric:        Mood and Affect: Mood normal.     Lab Results: Lab Results  Component Value Date   WBC 12.3 (H) 07/26/2024   HGB 12.8 07/26/2024   HCT 39.9 07/26/2024   MCV 97.8 07/26/2024   PLT 138 (L) 07/26/2024    Lab Results  Component Value Date   CREATININE 0.40 (L) 07/26/2024   BUN 14 07/26/2024   NA 141 07/26/2024   K 3.9 07/26/2024   CL 116 (H) 07/26/2024   CO2 15 (L) 07/26/2024    Lab Results  Component Value Date   ALT 35 07/23/2024   AST 41 07/23/2024   GGT 28 10/23/2023   ALKPHOS 76 07/23/2024   BILITOT 1.1 07/23/2024     Assessment & Plan:  # E coli bacteremia  # Complicated UTI  # Obstructive Uropathy  S/p cystoscopy with left retrograde pyelogram and left ureteral stent placement  # Allergy to ciprofloxacin and sulfa -OPAT ordered with cx 2 weeks from 10/7 eot 10/20 -She noted lower abdominal stone pain and  Hematuria.  -New midline 08/01/24. OR on 10/29  with urology for cystoscopy/litho and plan to place another stent per pt.  -will extend abx till 10/30 -f/u on 10/31 with ID  #PICC #Medication management - 07/29/2024 labs CRP 21.9, esr 41, wbc 10.4  OPAT Diagnosis: E coli bacteremia/Complicated UTI    Culture Result: E coli    Allergies       Allergies  Allergen Reactions   Aquacel [Carboxymethylcellulose] Other (See Comments)      Blisters     Dextromethorphan Anaphylaxis   Nickel Rash      Turn read   Sulfonamide Derivatives Anaphylaxis, Hives and Swelling      Swelling of tongue   Aspirin  Nausea And Vomiting      Severe GI upset.  Pt states she can tolerate 81 mg asa only.    Ciprofloxacin Itching and Swelling  Angioedema, urticaria   Cosyntropin Hives and Swelling   Amoxicillin  Nausea And Vomiting      GI upset   Diclofenac Sodium Dermatitis      Gel causes legs to break out   Latex Rash      Blisters   Moxifloxacin Other (See Comments)      GI upset   Nsaids Other (See Comments)      Gi upset   Tape Other (See Comments) and Dermatitis      Blisters skin        OPAT Orders Discharge antibiotics to be given via PICC line Discharge antibiotics: IV ceftriaxone, dosing per pharmacy  End Date: 10/30   [facility] Care Per Protocol:   Home health RN for IV administration and teaching; PICC line care and labs.     Labs weekly while on IV antibiotics: X__ CBC with differential __ BMP X CMP X__ CRP __ ESR __ Vancomycin  trough __ CK   X__ Please pull PIC at completion of IV antibiotics __ Please leave PIC in place until doctor has seen patient or been notified   Fax weekly labs to [phone]  Clinic Follow Up Appt:  10/31  #Rheumatoid arthritis - On Orencia followed by rheumatology.    Loney Stank, MD [facility]   08/06/24  3:50 PM   I have personally spent 70 minutes involved in face-to-face and non-face-to-face activities for this  patient on the day of the visit. Professional time spent includes the following activities: Preparing to see the patient (review of tests), Obtaining and/or reviewing separately obtained history (admission/discharge record), Performing a medically appropriate examination and/or evaluation , Ordering medications/tests/procedures, referring and communicating with other health care professionals, Documenting clinical information in the EMR, Independently interpreting results (not separately reported), Communicating results to the patient/family/caregiver, Counseling and educating the patient/family/caregiver and Care coordination (not separately reported).

## 2024-08-07 ENCOUNTER — Telehealth: Payer: Self-pay

## 2024-08-07 ENCOUNTER — Ambulatory Visit: Attending: Cardiovascular Disease | Admitting: Nurse Practitioner

## 2024-08-07 ENCOUNTER — Ambulatory Visit: Payer: Self-pay | Admitting: Internal Medicine

## 2024-08-07 DIAGNOSIS — Z01810 Encounter for preprocedural cardiovascular examination: Secondary | ICD-10-CM | POA: Diagnosis not present

## 2024-08-07 NOTE — Telephone Encounter (Signed)
 Per Dr. Dennise patient IV abx will be extended until 08/14/24 and leave picc line in until patient is seen on 08/15/24.  I have sent a message to [employer] with orders.  Nivaan Dicenzo ONEIDA Ligas, CMA

## 2024-08-07 NOTE — Progress Notes (Signed)
 Virtual Visit via Telephone Note   Because of [name] co-morbid illnesses, she is at least at moderate risk for complications without adequate follow up.  This format is felt to be most appropriate for this patient at this time.  Due to technical limitations with video connection (technology), today's appointment will be conducted as an audio only telehealth visit, and [name] verbally agreed to proceed in this manner.   All issues noted in this document were discussed and addressed.  No physical exam could be performed with this format.  Evaluation Performed:  Preoperative cardiovascular risk assessment _____________   Date:  08/07/2024   Patient ID:  [name], DOB [birth year], MRN [id number] Patient Location:  Home Provider location:   Office  Primary Care Provider:  Debrah Josette ORN., PA-C Primary Cardiologist:  None  Chief Complaint / Patient Profile   63 y.o. y/o female with a h/o chest pain with normal coronary arteries, CVA, hyperlipidemia, seizures, rheumatoid arthritis, who is pending Ureteroscopy removal 08/13/2024 with Dr. Carolee of [facility] and presents today for telephonic preoperative cardiovascular risk assessment.  History of Present Illness    [name] is a 63 y.o. female who presents via audio/video conferencing for a telehealth visit today.  Pt was last seen in cardiology clinic on 04/14/2024 by Josefa Beauvais, NP. At that time [name] was doing well.  The patient is now pending procedure as outlined above. Since her last visit, she has done well from a cardiac standpoint.   She denies chest pain, palpitations, dyspnea, pnd, orthopnea, n, v, dizziness, syncope, edema, weight gain, or early satiety. All other systems reviewed and are otherwise negative except as noted above.   Past Medical History    Past Medical History:  Diagnosis Date   Allergic rhinitis    uses Flonase  daily as needed and takes CLaritin  daily    Anxiety    takes Xanax  daily as needed   Asthma    Albuterol  inhaler prn;SYmbicort  daily   Cerebral vascular malformation    sees dr mavis for monitoring as needed, sees dr lewitt for headaches every 4 months   Congenital sucrase-isomaltase deficiency    COPD (chronic obstructive pulmonary disease) (HCC)    Depression    takes Citalopram  daily   Diverticulitis at age 52   Eczema    GERD (gastroesophageal reflux disease)    takes Protonix  daily   Hard of hearing    History of kidney stones    History of migraine    last one 10+yrs ago   History of staph infection 69yrs ago   Hyperlipidemia    takes Pravastatin  daily   IBS (irritable bowel syndrome)    mixed   Insomnia    Joint pain    Lactose intolerance    Nausea    takes Zofran  daily as needed   Osteoarthritis    PFO (patent foramen ovale)    PONV (postoperative nausea and vomiting)    Postoperative anemia due to acute blood loss 02/08/2016   Primary biliary cholangitis (HCC)    Primary localized osteoarthritis of left knee    Primary localized osteoarthritis of right knee 11/03/2015   Psoriatic arthritis (HCC)    Rheumatoid arthritis(714.0) 10/16/2008   oa and ra;Rhemicade IV every 6wks and Metotrexate weekly   Seizures (HCC) 6months ago 03/21/14   takes Depakote  daily   Shortness of breath    with exertion   Sleep apnea    study done >25yrs  ago; trying to get a new machine   Spinal headache    patient states that she thinks she had a spinal headache a long time ago   Stress incontinence    Stroke (HCC) 03/26/2013   left sided weakness   Thyroid  cyst    Past Surgical History:  Procedure Laterality Date   APPENDECTOMY  10/17/1979   BIOPSY  12/16/2019   Procedure: BIOPSY;  Surgeon: Kristie Lamprey, MD;  [facility]: [facility];  Service: Endoscopy;;   BIOPSY  07/19/2023   Procedure: BIOPSY;  Surgeon: Cinderella Deatrice FALCON, MD;  [facility];  Service: Endoscopy;;   CARDIAC CATHETERIZATION  10/16/2002    CHOLECYSTECTOMY  10/17/1979   COLONOSCOPY     COLONOSCOPY N/A 03/21/2024   Procedure: COLONOSCOPY;  Surgeon: Shaaron Lamar HERO, MD;  [facility];  Service: Endoscopy;  Laterality: N/A;   COLONOSCOPY WITH PROPOFOL  N/A 12/16/2019   Procedure: COLONOSCOPY WITH PROPOFOL ;  Surgeon: Kristie Lamprey, MD;  [facility];  Service: Endoscopy;  Laterality: N/A;   CORONARY PRESSURE/FFR STUDY N/A 07/25/2022   Procedure: INTRAVASCULAR PRESSURE WIRE/FFR STUDY;  Surgeon: Elmira Newman PARAS, MD;  [facility];  Service: Cardiovascular;  Laterality: N/A;   CYSTOSCOPY WITH STENT PLACEMENT Left 07/22/2024   Procedure: CYSTOSCOPY, WITH STENT INSERTION;  Surgeon: Carolee Sherwood JONETTA DOUGLAS, MD;  [facility];  Service: Urology;  Laterality: Left;   ESOPHAGOGASTRODUODENOSCOPY     ESOPHAGOGASTRODUODENOSCOPY N/A 03/21/2024   Procedure: EGD (ESOPHAGOGASTRODUODENOSCOPY);  Surgeon: Shaaron Lamar HERO, MD;  [facility];  Service: Endoscopy;  Laterality: N/A;   ESOPHAGOGASTRODUODENOSCOPY (EGD) WITH PROPOFOL  N/A 07/19/2023   Procedure: ESOPHAGOGASTRODUODENOSCOPY (EGD) WITH PROPOFOL ;  Surgeon: Cinderella Deatrice FALCON, MD;  [facility];  Service: Endoscopy;  Laterality: N/A;  12:45;asa 1-2, bumped to 1:00 for lunch to fit - messaged Tanya   FOOT SURGERY  10/16/1997   right ankle   HERNIA REPAIR  10/16/2004   x2   INCISIONAL HERNIA REPAIR  05/20/2012   Procedure: HERNIA REPAIR INCISIONAL;  Surgeon: Debby LABOR. Cornett, MD;  [facility];  Service: General;  Laterality: N/A;   INCONTINENCE SURGERY  10/16/2008   sling done    KIDNEY STONE SURGERY  10/17/1999   KNEE ARTHROSCOPY  10/16/1990   left   left foot plating and scarping for arthritis  10/16/2009   LEFT HEART CATH AND CORONARY ANGIOGRAPHY N/A 07/25/2022   Procedure: LEFT HEART CATH AND CORONARY ANGIOGRAPHY;  Surgeon: Elmira Newman PARAS, MD;  [facility];  Service: Cardiovascular;  Laterality: N/A;    POLYPECTOMY  07/19/2023   Procedure: POLYPECTOMY;  Surgeon: Cinderella Deatrice FALCON, MD;  [facility];  Service: Endoscopy;;   right ear tube insertion  10/16/2009   TEE WITHOUT CARDIOVERSION N/A 05/15/2013   Procedure: TRANSESOPHAGEAL ECHOCARDIOGRAM (TEE);  Surgeon: Erick JONELLE Bergamo, MD;  Location: [facility]COPY;  Service: Cardiovascular;  Laterality: N/A;   thryoid biopsy     cyst removed   TONSILLECTOMY AND ADENOIDECTOMY Bilateral 04/01/2014   Procedure: BILATERAL TONSILLECTOMY AND ADENOIDECTOMY;  Surgeon: Ana LELON Moccasin, MD;  Location: [facility] OR;  Service: ENT;  Laterality: Bilateral;   TOTAL ABDOMINAL HYSTERECTOMY  10/17/1999   TOTAL KNEE ARTHROPLASTY Right 11/03/2015   Procedure: RIGHT TOTAL KNEE ARTHROPLASTY/RIGHT;  Surgeon: Lamar Millman, MD;  [facility];  Service: Orthopedics;  Laterality: Right;   TOTAL KNEE ARTHROPLASTY Left 02/07/2016   Procedure: TOTAL KNEE ARTHROPLASTY;  Surgeon: Lamar Millman, MD;  [facility];  Service: Orthopedics;  Laterality: Left;   TUBAL LIGATION  10/16/1988    Allergies  Allergies  Allergen Reactions   Aquacel [Carboxymethylcellulose] Other (See Comments)    Blisters    Dextromethorphan Anaphylaxis   Nickel Rash    Turn read   Sulfonamide Derivatives Anaphylaxis, Hives and Swelling    Swelling of tongue   Aspirin  Nausea And Vomiting    Severe GI upset.  Pt states she can tolerate 81 mg asa only.    Ciprofloxacin Itching and Swelling    Angioedema, urticaria   Cosyntropin Hives and Swelling   Amoxicillin  Nausea And Vomiting    GI upset   Diclofenac Sodium Dermatitis    Gel causes legs to break out   Latex Rash    Blisters   Moxifloxacin Other (See Comments)    GI upset   Nsaids Other (See Comments)    Gi upset   Tape Other (See Comments) and Dermatitis    Blisters skin    Home Medications    Prior to Admission medications   Medication Sig Start Date End Date Taking? Authorizing Provider  albuterol  (VENTOLIN  HFA) 108 (90  Base) MCG/ACT inhaler Inhale 1-2 puffs into the lungs every 6 (six) hours as needed for wheezing or shortness of breath.    [provider]  ALPRAZolam  (XANAX ) 0.5 MG tablet Take 0.5 mg by mouth at bedtime. 06/10/24   [provider]  amLODipine  (NORVASC ) 5 MG tablet Take 1 tablet (5 mg total) by mouth daily. 04/14/24   Emelia Josefa HERO, NP  aspirin  EC 81 MG tablet Take 81 mg by mouth at bedtime.    [provider]  dicyclomine  (BENTYL ) 10 MG capsule Take 1 capsule (10 mg total) by mouth 4 (four) times daily -  before meals and at bedtime. Patient taking differently: Take 10 mg by mouth 4 (four) times daily. 02/29/24 08/06/24  Maree, Pratik D, DO  hyoscyamine  (LEVSIN  SL) 0.125 MG SL tablet Place 1 tablet (0.125 mg total) under the tongue every 6 (six) hours as needed. 03/13/24   Carlan, Chelsea L, NP  ipratropium (ATROVENT ) 0.03 % nasal spray Place 2 sprays into the nose in the morning and at bedtime. 05/18/21   [provider]  loratadine  (CLARITIN ) 10 MG tablet Take 10 mg by mouth at bedtime.    [provider]  lubiprostone  (AMITIZA ) 24 MCG capsule Take 1 capsule (24 mcg total) by mouth 2 (two) times daily with a meal. 06/04/24 12/01/24  Ahmed, Deatrice FALCON, MD  nitroGLYCERIN  (NITROSTAT ) 0.4 MG SL tablet Place 1 tablet (0.4 mg total) under the tongue every 5 (five) minutes as needed for chest pain. 03/13/23 08/06/24  Ladona Heinz, MD  OVER THE COUNTER MEDICATION Bio True eye drops daily.    [provider]  oxyCODONE  (OXY IR/ROXICODONE ) 5 MG immediate release tablet Take 1 tablet (5 mg total) by mouth every 4 (four) hours as needed for severe pain (pain score 7-10). 07/26/24   Barbarann Nest, MD  pantoprazole  (PROTONIX ) 40 MG tablet TAKE 1 TABLET BY MOUTH DAILY 02/06/24   Ahmed, Deatrice FALCON, MD  pravastatin  (PRAVACHOL ) 40 MG tablet Take 40 mg by mouth daily.    [provider]  PREBIOTIC PRODUCT PO Take by mouth at bedtime.    [provider]   PRESCRIPTION MEDICATION In Cholesterol study. Has injection once a month.has been in study for 3 years as of 12/07/23 and has 6 months to left in study. ( Will finish around end of August  2025 )    [provider]  rizatriptan (MAXALT) 5 MG tablet Take 5 mg by mouth as needed for migraine. (not more than 3 a week). May repeat in 2 hours if needed 12/06/23 12/05/24  [provider]  Sacrosidase  (SUCRAID ) 8500 UNIT/ML SOLN Take 2 mLs (17,000 Units total) by mouth with breakfast, with lunch, and with evening meal. Administer half the dose at the beginning of th meal or snack and other half during the meals 08/06/24 11/04/24  Ahmed, Muhammad F, MD  tiZANidine (ZANAFLEX) 4 MG tablet Take 1 tablet by mouth every 6 (six) hours as needed for muscle spasms. 10/05/23   [provider]  topiramate  (TOPAMAX ) 100 MG tablet Take 1 tablet (100 mg total) by mouth at bedtime. 07/26/24   Barbarann Nest, MD  ursodiol  (ACTIGALL ) 300 MG capsule Take 1 capsule (300 mg total) by mouth 3 (three) times daily. 04/23/24   Cinderella Deatrice FALCON, MD    Physical Exam    Vital Signs:  Channing LABOR Wands does not have vital signs available for review today.  Given telephonic nature of communication, physical exam is limited. AAOx3. NAD. Normal affect.  Speech and respirations are unlabored.  Accessory Clinical Findings    None  Assessment & Plan    1.  Preoperative Cardiovascular Risk Assessment:  According to the Revised Cardiac Risk Index (RCRI), her Perioperative Risk of Major Cardiac Event is (%): 0.9. Her Functional Capacity in METs is: 5.07 according to the Duke Activity Status Index (DASI). Therefore, based on ACC/AHA guidelines, patient would be at acceptable risk for the planned procedure without further cardiovascular testing.  The patient was advised that if she develops new symptoms prior to surgery to contact our office to arrange for a follow-up visit, and she verbalized  understanding.  From a cardiology standpoint, patient may hold aspirin  5 to 7 days prior to surgery.  Please resume Aspirin  as soon as possible postprocedure, at the discretion of the surgeon.   A copy of this note will be routed to requesting surgeon.  Time:   Today, I have spent 5 minutes with the patient with telehealth technology discussing medical history, symptoms, and management plan.     Damien JAYSON Braver, NP  08/07/2024, 2:41 PM

## 2024-08-08 NOTE — Anesthesia Preprocedure Evaluation (Addendum)
 Anesthesia Evaluation  Patient identified by MRN, date of birth, ID band Patient awake    Reviewed: Allergy & Precautions, NPO status , Patient's Chart, lab work & pertinent test results  History of Anesthesia Complications (+) PONV, POST - OP SPINAL HEADACHE and history of anesthetic complications  Airway Mallampati: II  TM Distance: >3 FB Neck ROM: Full    Dental   Pulmonary asthma , sleep apnea , COPD, former smoker   breath sounds clear to auscultation       Cardiovascular  Rhythm:Regular Rate:Normal  PFO   Neuro/Psych Seizures -,   Neuromuscular disease CVA, Residual Symptoms    GI/Hepatic ,GERD  ,,(+) Hepatitis -  Endo/Other  negative endocrine ROS    Renal/GU Renal disease     Musculoskeletal  (+) Arthritis ,  Fibromyalgia -  Abdominal   Peds  Hematology  (+) Blood dyscrasia, anemia   Anesthesia Other Findings   Reproductive/Obstetrics                              Anesthesia Physical Anesthesia Plan  ASA: 2  Anesthesia Plan: General   Post-op Pain Management: Tylenol  PO (pre-op)*   Induction: Intravenous  PONV Risk Score and Plan: 4 or greater and Ondansetron , Dexamethasone , Propofol  infusion, Midazolam  and Treatment may vary due to age or medical condition  Airway Management Planned: LMA  Additional Equipment:   Intra-op Plan:   Post-operative Plan: Extubation in OR  Informed Consent: I have reviewed the patients History and Physical, chart, labs and discussed the procedure including the risks, benefits and alternatives for the proposed anesthesia with the patient or authorized representative who has indicated his/her understanding and acceptance.     Dental advisory given  Plan Discussed with: CRNA  Anesthesia Plan Comments:          Anesthesia Quick Evaluation

## 2024-08-08 NOTE — Progress Notes (Signed)
 Anesthesia Chart Review   Case: [id number] Date/Time: 08/13/24 1115   Procedures:      CYSTOSCOPY/URETEROSCOPY/HOLMIUM LASER/STENT PLACEMENT (Left)     CYSTOSCOPY, WITH RETROGRADE PYELOGRAM (Left)   Anesthesia type: General   Diagnosis: Calculus of ureter [N20.1]   Pre-op diagnosis: LEFT URETERAL STONE   [facility]   Surgeons: Carolee Sherwood JONETTA DOUGLAS, MD       DISCUSSION:63 y.o. former smoker with h/o PONV, sleep apnea, GERD, COPD, CVA with residual left sided weakness, seizures, left ureteral stone scheduled for above procedure 08/13/2024 with Dr. Sherwood Carolee.   H/o post op spinal headache, PONV.   Cardiac Cath 2022 with no evidence of CAD.   Per cardiology preoperative evaluation 08/07/24, According to the Revised Cardiac Risk Index (RCRI), her Perioperative Risk of Major Cardiac Event is (%): 0.9. Her Functional Capacity in METs is: 5.07 according to the Duke Activity Status Index (DASI). Therefore, based on ACC/AHA guidelines, patient would be at acceptable risk for the planned procedure without further cardiovascular testing.   The patient was advised that if she develops new symptoms prior to surgery to contact our office to arrange for a follow-up visit, and she verbalized understanding.   From a cardiology standpoint, patient may hold aspirin  5 to 7 days prior to surgery.  Please resume Aspirin  as soon as possible postprocedure, at the discretion of the surgeon.  Pt with admission 10/7-10/08/2024 with sepsis secondary to UTI. Discharged on IV antibiotics and follow up with ID. During admission underwent cystoscopy and stent placement 07/22/2024, no anesthesia complications noted. Per most recent ID note IV abx extended until 08/14/24, PICC line to remain in place until 08/15/24.   VS: BP 132/70   Temp 37 C (Oral)   Resp 16   Ht 5' 4 (1.626 m)   Wt 66.6 kg   SpO2 100%   BMI 25.20 kg/m   PROVIDERS: Debrah Josette ORN., PA-C is PCP    LABS: Labs  reviewed: Acceptable for surgery. (all labs ordered are listed, but only abnormal results are displayed)  Labs Reviewed  COMPREHENSIVE METABOLIC PANEL WITH GFR - Abnormal; Notable for the following components:      Result Value   Glucose, Bld 104 (*)    All other components within normal limits     IMAGES:   EKG:   CV: Echocardiogram 07/05/2021: Normal LV systolic function with visual EF 55-60%. Left ventricle cavity is normal in size. Normal left ventricular wall thickness. Normal global wall motion. Normal diastolic filling pattern, normal LAP. Trace tricuspid regurgitation. No evidence of pulmonary hypertension. Mild pulmonic regurgitation. Compared to study 08/27/2017 no significant change. Documented PFO on prior TEE not well visualized on current study.   Past Medical History:  Diagnosis Date   Allergic rhinitis    uses Flonase  daily as needed and takes CLaritin  daily   Anxiety    takes Xanax  daily as needed   Asthma    Albuterol  inhaler prn;SYmbicort  daily   Cerebral vascular malformation    sees dr mavis for monitoring as needed, sees dr lewitt for headaches every 4 months   Congenital sucrase-isomaltase deficiency    COPD (chronic obstructive pulmonary disease) (HCC)    Depression    takes Citalopram  daily   Diverticulitis at age 57   Eczema    GERD (gastroesophageal reflux disease)    takes Protonix  daily   Hard of hearing    History of kidney stones    History of migraine  last one 10+yrs ago   History of staph infection 52yrs ago   Hyperlipidemia    takes Pravastatin  daily   IBS (irritable bowel syndrome)    mixed   Insomnia    Joint pain    Lactose intolerance    Nausea    takes Zofran  daily as needed   Osteoarthritis    PFO (patent foramen ovale)    PONV (postoperative nausea and vomiting)    Postoperative anemia due to acute blood loss 02/08/2016   Primary biliary cholangitis (HCC)    Primary localized osteoarthritis of left knee     Primary localized osteoarthritis of right knee 11/03/2015   Psoriatic arthritis (HCC)    Rheumatoid arthritis(714.0) 10/16/2008   oa and ra;Rhemicade IV every 6wks and Metotrexate weekly   Seizures (HCC) 6months ago 03/21/14   takes Depakote  daily   Shortness of breath    with exertion   Sleep apnea    study done >26yrs ago; trying to get a new machine   Spinal headache    patient states that she thinks she had a spinal headache a long time ago   Stress incontinence    Stroke (HCC) 03/26/2013   left sided weakness   Thyroid  cyst     Past Surgical History:  Procedure Laterality Date   APPENDECTOMY  10/17/1979   BIOPSY  12/16/2019   Procedure: BIOPSY;  Surgeon: Kristie Lamprey, MD;  [facility];  Service: Endoscopy;;   BIOPSY  07/19/2023   Procedure: BIOPSY;  Surgeon: Cinderella Deatrice FALCON, MD;  [facility];  Service: Endoscopy;;   CARDIAC CATHETERIZATION  10/16/2002   CHOLECYSTECTOMY  10/17/1979   COLONOSCOPY     COLONOSCOPY N/A 03/21/2024   Procedure: COLONOSCOPY;  Surgeon: Shaaron Lamar HERO, MD;  [facility];  Service: Endoscopy;  Laterality: N/A;   COLONOSCOPY WITH PROPOFOL  N/A 12/16/2019   Procedure: COLONOSCOPY WITH PROPOFOL ;  Surgeon: Kristie Lamprey, MD;  [facility];  Service: Endoscopy;  Laterality: N/A;   CORONARY PRESSURE/FFR STUDY N/A 07/25/2022   Procedure: INTRAVASCULAR PRESSURE WIRE/FFR STUDY;  Surgeon: Elmira Newman PARAS, MD;  [facility];  Service: Cardiovascular;  Laterality: N/A;   CYSTOSCOPY WITH STENT PLACEMENT Left 07/22/2024   Procedure: CYSTOSCOPY, WITH STENT INSERTION;  Surgeon: Carolee Sherwood JONETTA DOUGLAS, MD;  [facility];  Service: Urology;  Laterality: Left;   ESOPHAGOGASTRODUODENOSCOPY     ESOPHAGOGASTRODUODENOSCOPY N/A 03/21/2024   Procedure: EGD (ESOPHAGOGASTRODUODENOSCOPY);  Surgeon: Shaaron Lamar HERO, MD;  [facility];  Service: Endoscopy;  Laterality: N/A;   ESOPHAGOGASTRODUODENOSCOPY  (EGD) WITH PROPOFOL  N/A 07/19/2023   Procedure: ESOPHAGOGASTRODUODENOSCOPY (EGD) WITH PROPOFOL ;  Surgeon: Cinderella Deatrice FALCON, MD;  [facility];  Service: Endoscopy;  Laterality: N/A;  12:45;asa 1-2, bumped to 1:00 for lunch to fit - messaged Tanya   FOOT SURGERY  10/16/1997   right ankle   HERNIA REPAIR  10/16/2004   x2   INCISIONAL HERNIA REPAIR  05/20/2012   Procedure: HERNIA REPAIR INCISIONAL;  Surgeon: Debby LABOR. Cornett, MD;  Location: [facility];  Service: General;  Laterality: N/A;   INCONTINENCE SURGERY  10/16/2008   sling done    KIDNEY STONE SURGERY  10/17/1999   KNEE ARTHROSCOPY  10/16/1990   left   left foot plating and scarping for arthritis  10/16/2009   LEFT HEART CATH AND CORONARY ANGIOGRAPHY N/A 07/25/2022   Procedure: LEFT HEART CATH AND CORONARY ANGIOGRAPHY;  Surgeon: Elmira Newman PARAS, MD;  [facility];  Service: Cardiovascular;  Laterality: N/A;   POLYPECTOMY  07/19/2023   Procedure: POLYPECTOMY;  Surgeon: Cinderella Deatrice FALCON, MD;  [facility];  Service: Endoscopy;;   right ear tube insertion  10/16/2009   TEE WITHOUT CARDIOVERSION N/A 05/15/2013   Procedure: TRANSESOPHAGEAL ECHOCARDIOGRAM (TEE);  Surgeon: Erick JONELLE Bergamo, MD;  Location: [facility] ENDOSCOPY;  Service: Cardiovascular;  Laterality: N/A;   thryoid biopsy     cyst removed   TONSILLECTOMY AND ADENOIDECTOMY Bilateral 04/01/2014   Procedure: BILATERAL TONSILLECTOMY AND ADENOIDECTOMY;  Surgeon: Ana LELON Moccasin, MD;  Location: [facility] OR;  Service: ENT;  Laterality: Bilateral;   TOTAL ABDOMINAL HYSTERECTOMY  10/17/1999   TOTAL KNEE ARTHROPLASTY Right 11/03/2015   Procedure: RIGHT TOTAL KNEE ARTHROPLASTY/RIGHT;  Surgeon: Lamar Millman, MD;  [facility];  Service: Orthopedics;  Laterality: Right;   TOTAL KNEE ARTHROPLASTY Left 02/07/2016   Procedure: TOTAL KNEE ARTHROPLASTY;  Surgeon: Lamar Millman, MD;  Location: [facility] OR;  Service: Orthopedics;  Laterality: Left;   TUBAL LIGATION   10/16/1988    MEDICATIONS:  albuterol  (VENTOLIN  HFA) 108 (90 Base) MCG/ACT inhaler   ALPRAZolam  (XANAX ) 0.5 MG tablet   amLODipine  (NORVASC ) 5 MG tablet   aspirin  EC 81 MG tablet   dicyclomine  (BENTYL ) 10 MG capsule   hyoscyamine  (LEVSIN  SL) 0.125 MG SL tablet   ipratropium (ATROVENT ) 0.03 % nasal spray   loratadine  (CLARITIN ) 10 MG tablet   lubiprostone  (AMITIZA ) 24 MCG capsule   nitroGLYCERIN  (NITROSTAT ) 0.4 MG SL tablet   OVER THE COUNTER MEDICATION   oxyCODONE  (OXY IR/ROXICODONE ) 5 MG immediate release tablet   pantoprazole  (PROTONIX ) 40 MG tablet   pravastatin  (PRAVACHOL ) 40 MG tablet   PREBIOTIC PRODUCT PO   PRESCRIPTION MEDICATION   rizatriptan (MAXALT) 5 MG tablet   Sacrosidase  (SUCRAID ) 8500 UNIT/ML SOLN   tiZANidine (ZANAFLEX) 4 MG tablet   topiramate  (TOPAMAX ) 100 MG tablet   ursodiol  (ACTIGALL ) 300 MG capsule   No current facility-administered medications for this encounter.     Gwendolyn Hoots Ward, PA-C [facility] [phone]

## 2024-08-13 ENCOUNTER — Encounter (HOSPITAL_COMMUNITY): Payer: Self-pay | Admitting: Urology

## 2024-08-13 ENCOUNTER — Ambulatory Visit (HOSPITAL_COMMUNITY): Payer: Self-pay | Admitting: Physician Assistant

## 2024-08-13 ENCOUNTER — Ambulatory Visit (HOSPITAL_COMMUNITY): Admitting: Anesthesiology

## 2024-08-13 ENCOUNTER — Ambulatory Visit (HOSPITAL_COMMUNITY)

## 2024-08-13 ENCOUNTER — Ambulatory Visit (HOSPITAL_COMMUNITY)
Admission: RE | Admit: 2024-08-13 | Discharge: 2024-08-13 | Disposition: A | Discharge status: Home / Self Care | Source: Ambulatory Visit | Attending: Urology | Admitting: Urology

## 2024-08-13 ENCOUNTER — Encounter (HOSPITAL_COMMUNITY)
Admission: RE | Disposition: A | Discharge status: Home / Self Care | Payer: Self-pay | Source: Ambulatory Visit | Attending: Urology

## 2024-08-13 DIAGNOSIS — J4489 Other specified chronic obstructive pulmonary disease: Secondary | ICD-10-CM | POA: Insufficient documentation

## 2024-08-13 DIAGNOSIS — Z87891 Personal history of nicotine dependence: Secondary | ICD-10-CM | POA: Insufficient documentation

## 2024-08-13 DIAGNOSIS — Z466 Encounter for fitting and adjustment of urinary device: Secondary | ICD-10-CM | POA: Diagnosis not present

## 2024-08-13 DIAGNOSIS — G709 Myoneural disorder, unspecified: Secondary | ICD-10-CM | POA: Insufficient documentation

## 2024-08-13 DIAGNOSIS — N202 Calculus of kidney with calculus of ureter: Secondary | ICD-10-CM | POA: Insufficient documentation

## 2024-08-13 DIAGNOSIS — K219 Gastro-esophageal reflux disease without esophagitis: Secondary | ICD-10-CM | POA: Insufficient documentation

## 2024-08-13 DIAGNOSIS — N201 Calculus of ureter: Secondary | ICD-10-CM | POA: Diagnosis not present

## 2024-08-13 DIAGNOSIS — M1990 Unspecified osteoarthritis, unspecified site: Secondary | ICD-10-CM | POA: Insufficient documentation

## 2024-08-13 DIAGNOSIS — Z8673 Personal history of transient ischemic attack (TIA), and cerebral infarction without residual deficits: Secondary | ICD-10-CM | POA: Diagnosis not present

## 2024-08-13 DIAGNOSIS — Z8261 Family history of arthritis: Secondary | ICD-10-CM | POA: Insufficient documentation

## 2024-08-13 DIAGNOSIS — G4730 Sleep apnea, unspecified: Secondary | ICD-10-CM | POA: Insufficient documentation

## 2024-08-13 DIAGNOSIS — M797 Fibromyalgia: Secondary | ICD-10-CM | POA: Insufficient documentation

## 2024-08-13 DIAGNOSIS — N289 Disorder of kidney and ureter, unspecified: Secondary | ICD-10-CM | POA: Insufficient documentation

## 2024-08-13 HISTORY — PX: CYSTOSCOPY/URETEROSCOPY/HOLMIUM LASER/STENT PLACEMENT: SHX6546

## 2024-08-13 HISTORY — PX: CYSTOSCOPY W/ RETROGRADES: SHX1426

## 2024-08-13 SURGERY — CYSTOSCOPY/URETEROSCOPY/HOLMIUM LASER/STENT PLACEMENT
Anesthesia: General | Site: Ureter | Laterality: Left

## 2024-08-13 MED ORDER — PROPOFOL 10 MG/ML IV BOLUS
INTRAVENOUS | Status: DC | PRN
  Administered 2024-08-13: 200 mg via INTRAVENOUS

## 2024-08-13 MED ORDER — CHLORHEXIDINE GLUCONATE CLOTH 2 % EX PADS: 6.0000 | MEDICATED_PAD | Freq: Once | CUTANEOUS | Status: DC

## 2024-08-13 MED ORDER — ORAL CARE MOUTH RINSE: 15.0000 mL | Freq: Once | OROMUCOSAL | Status: AC

## 2024-08-13 MED ORDER — FENTANYL CITRATE (PF) 50 MCG/ML IJ SOSY
25.0000 ug | PREFILLED_SYRINGE | INTRAMUSCULAR | Status: DC | PRN
  Administered 2024-08-13: 50 ug via INTRAVENOUS

## 2024-08-13 MED ORDER — 0.9 % SODIUM CHLORIDE (POUR BTL) OPTIME
TOPICAL | Status: DC | PRN
  Administered 2024-08-13: 1000 mL

## 2024-08-13 MED ORDER — LACTATED RINGERS IV SOLN
INTRAVENOUS | Status: DC
Start: 2024-08-13 — End: 2024-08-13

## 2024-08-13 MED ORDER — DEXAMETHASONE SOD PHOSPHATE PF 10 MG/ML IJ SOLN
INTRAMUSCULAR | Status: DC | PRN
  Administered 2024-08-13: 5 mg via INTRAVENOUS

## 2024-08-13 MED ORDER — SODIUM CHLORIDE 0.9 % IV SOLN
2.0000 g | INTRAVENOUS | Status: AC
  Administered 2024-08-13: 2 g via INTRAVENOUS
  Filled 2024-08-13: qty 20

## 2024-08-13 MED ORDER — FENTANYL CITRATE (PF) 100 MCG/2ML IJ SOLN
INTRAMUSCULAR | Status: AC
  Filled 2024-08-13: qty 2

## 2024-08-13 MED ORDER — IOHEXOL 300 MG/ML  SOLN
INTRAMUSCULAR | Status: DC | PRN
  Administered 2024-08-13: 10 mL

## 2024-08-13 MED ORDER — OXYCODONE HCL 5 MG PO TABS: 5.0000 mg | ORAL_TABLET | ORAL | 0 refills | Status: AC | PRN

## 2024-08-13 MED ORDER — MIDAZOLAM HCL 5 MG/5ML IJ SOLN
INTRAMUSCULAR | Status: DC | PRN
  Administered 2024-08-13: 2 mg via INTRAVENOUS

## 2024-08-13 MED ORDER — MIDAZOLAM HCL 2 MG/2ML IJ SOLN
INTRAMUSCULAR | Status: AC
  Filled 2024-08-13: qty 2

## 2024-08-13 MED ORDER — FENTANYL CITRATE (PF) 50 MCG/ML IJ SOSY
PREFILLED_SYRINGE | INTRAMUSCULAR | Status: AC
  Filled 2024-08-13: qty 1

## 2024-08-13 MED ORDER — LIDOCAINE HCL (CARDIAC) PF 100 MG/5ML IV SOSY
PREFILLED_SYRINGE | INTRAVENOUS | Status: DC | PRN
  Administered 2024-08-13: 40 mg via INTRATRACHEAL

## 2024-08-13 MED ORDER — FENTANYL CITRATE (PF) 50 MCG/ML IJ SOSY
PREFILLED_SYRINGE | INTRAMUSCULAR | Status: AC
  Filled 2024-08-13: qty 2

## 2024-08-13 MED ORDER — AMISULPRIDE (ANTIEMETIC) 5 MG/2ML IV SOLN: 10.0000 mg | Freq: Once | INTRAVENOUS | Status: DC | PRN

## 2024-08-13 MED ORDER — PROPOFOL 10 MG/ML IV BOLUS
INTRAVENOUS | Status: AC
  Filled 2024-08-13: qty 20

## 2024-08-13 MED ORDER — CHLORHEXIDINE GLUCONATE 0.12 % MT SOLN
15.0000 mL | Freq: Once | OROMUCOSAL | Status: AC
  Administered 2024-08-13: 15 mL via OROMUCOSAL

## 2024-08-13 MED ORDER — ACETAMINOPHEN 500 MG PO TABS
1000.0000 mg | ORAL_TABLET | ORAL | Status: AC
  Administered 2024-08-13: 1000 mg via ORAL
  Filled 2024-08-13: qty 2

## 2024-08-13 MED ORDER — BUPIVACAINE LIPOSOME 1.3 % IJ SUSP: 20.0000 mL | Freq: Once | INTRAMUSCULAR | Status: DC

## 2024-08-13 MED ORDER — ONDANSETRON HCL 4 MG/2ML IJ SOLN
INTRAMUSCULAR | Status: DC | PRN
Start: 2024-08-13 — End: 2024-08-13
  Administered 2024-08-13: 4 mg via INTRAVENOUS

## 2024-08-13 MED ORDER — FENTANYL CITRATE (PF) 50 MCG/ML IJ SOSY
25.0000 ug | PREFILLED_SYRINGE | INTRAMUSCULAR | Status: DC | PRN
  Administered 2024-08-13 (×3): 50 ug via INTRAVENOUS

## 2024-08-13 MED ORDER — SODIUM CHLORIDE 0.9 % IR SOLN
Status: DC | PRN
  Administered 2024-08-13: 3000 mL

## 2024-08-13 MED ORDER — GABAPENTIN 300 MG PO CAPS
300.0000 mg | ORAL_CAPSULE | ORAL | Status: AC
  Administered 2024-08-13: 300 mg via ORAL
  Filled 2024-08-13: qty 1

## 2024-08-13 MED ORDER — FENTANYL CITRATE (PF) 100 MCG/2ML IJ SOLN
INTRAMUSCULAR | Status: DC | PRN
  Administered 2024-08-13: 25 ug via INTRAVENOUS
  Administered 2024-08-13: 50 ug via INTRAVENOUS
  Administered 2024-08-13 (×2): 25 ug via INTRAVENOUS

## 2024-08-13 SURGICAL SUPPLY — 21 items
BAG URO CATCHER STRL LF (MISCELLANEOUS) ×1 IMPLANT
BASKET LASER NITINOL 1.9FR (BASKET) IMPLANT
BASKET ZERO TIP NITINOL 2.4FR (BASKET) IMPLANT
CATH URETERAL DUAL LUMEN 10F (MISCELLANEOUS) IMPLANT
CATH URETL OPEN END 6FR 70 (CATHETERS) ×1 IMPLANT
CLOTH BEACON ORANGE TIMEOUT ST (SAFETY) ×1 IMPLANT
GLOVE BIO SURGEON STRL SZ7.5 (GLOVE) ×1 IMPLANT
GOWN STRL REUS W/ TWL XL LVL3 (GOWN DISPOSABLE) ×1 IMPLANT
GUIDEWIRE ANG ZIPWIRE 038X150 (WIRE) IMPLANT
GUIDEWIRE STR DUAL SENSOR (WIRE) ×1 IMPLANT
KIT TURNOVER KIT A (KITS) ×1 IMPLANT
MANIFOLD NEPTUNE II (INSTRUMENTS) ×1 IMPLANT
NS IRRIG 1000ML POUR BTL (IV SOLUTION) IMPLANT
PACK CYSTO (CUSTOM PROCEDURE TRAY) ×1 IMPLANT
SHEATH NAVIGATOR HD 11/13X28 (SHEATH) IMPLANT
SHEATH NAVIGATOR HD 11/13X36 (SHEATH) IMPLANT
SHEATH NAVIGATOR HD 12/14X36 (SHEATH) IMPLANT
STENT URET 6FRX24 CONTOUR (STENTS) IMPLANT
TRACTIP FLEXIVA PULS ID 200XHI (Laser) IMPLANT
TUBING CONNECTING 10 (TUBING) ×1 IMPLANT
TUBING UROLOGY SET (TUBING) ×1 IMPLANT

## 2024-08-13 NOTE — Anesthesia Procedure Notes (Signed)
 Procedure Name: LMA Insertion Date/Time: 08/13/2024 12:01 PM  Performed by: Cena Epps, CRNAPre-anesthesia Checklist: Patient identified, Emergency Drugs available, Suction available and Patient being monitored Patient Re-evaluated:Patient Re-evaluated prior to induction Oxygen Delivery Method: Circle System Utilized Preoxygenation: Pre-oxygenation with 100% oxygen Induction Type: IV induction Ventilation: Mask ventilation without difficulty LMA: LMA inserted LMA Size: 4.0 Number of attempts: 1 Airway Equipment and Method: Bite block Placement Confirmation: positive ETCO2 Tube secured with: Tape Dental Injury: Teeth and Oropharynx as per pre-operative assessment

## 2024-08-13 NOTE — Discharge Instructions (Signed)

## 2024-08-13 NOTE — Interval H&P Note (Signed)
 History and Physical Interval Note:  08/13/2024 11:14 AM  [name]  has presented today for surgery, with the diagnosis of LEFT URETERAL STONE.  The various methods of treatment have been discussed with the patient and family. After consideration of risks, benefits and other options for treatment, the patient has consented to  Procedure(s): CYSTOSCOPY/URETEROSCOPY/HOLMIUM LASER/STENT PLACEMENT (Left) CYSTOSCOPY, WITH RETROGRADE PYELOGRAM (Left) as a surgical intervention.  The patient's history has been reviewed, patient examined, no change in status, stable for surgery.  I have reviewed the patient's chart and labs.  Questions were answered to the patient's satisfaction.     Sherwood JONETTA Edison, III

## 2024-08-13 NOTE — Transfer of Care (Signed)
 Immediate Anesthesia Transfer of Care Note  Patient: [name]  Procedure(s) Performed: CYSTOSCOPY/URETEROSCOPY/HOLMIUM LASER/STENT PLACEMENT (Left: Ureter) CYSTOSCOPY, WITH RETROGRADE PYELOGRAM (Left: Ureter)  Patient Location: PACU  Anesthesia Type:General  Level of Consciousness: awake and alert   Airway & Oxygen Therapy: Patient Spontanous Breathing and Patient connected to face mask oxygen  Post-op Assessment: Report given to RN and Post -op Vital signs reviewed and stable  Post vital signs: Reviewed and stable  Last Vitals:  Vitals Value Taken Time  BP 144/86 08/13/24 12:50  Temp 36.4 C 08/13/24 12:50  Pulse 87 08/13/24 12:54  Resp 13 08/13/24 12:54  SpO2 97 % 08/13/24 12:54  Vitals shown include unfiled device data.  Last Pain:  Vitals:   08/13/24 1250  TempSrc:   PainSc: Asleep         Complications: No notable events documented.

## 2024-08-13 NOTE — Op Note (Signed)
 Operative Note  Preoperative diagnosis:  1.  Left renal and ureteral calculus  Postoperative diagnosis: 1.  Left renal and ureteral calculus  Procedure(s): 1.  Cystoscopy with left retrograde pyelogram, left ureteroscopy with stone extraction, left ureteral stent exchange  Surgeon: Sherwood Edison, MD  Assistants: None  Anesthesia: General  Complications: None immediate  EBL: Minimal  Specimens: 1.  None  Drains/Catheters: 1.  6 x 24 double-J ureteral stent  Intraoperative findings: 1.  Normal urethra and bladder 2.  Left ureteroscopy revealed distal 4 mm ureteral calculus basket extracted without any resistance 3.  Left pyeloscopy did not identify a stone.  There was a radiopacity in the vicinity of the left lower pole that did fill with some contrast.  Was not able to be accessed with the scope on maximal flexion downward.  4.  Left retrograde pyelogram revealed no obvious filling defect or hydronephrosis.  It did look like contrast extended into a calyx containing the calcification on KUB  Indication: 63 year old female with a left ureteral calculus status post urgent stent for sepsis presents for the previously mentioned operation.  Description of procedure:  The patient was identified and consent was obtained.  The patient was taken to the operating room and placed in the supine position.  The patient was placed under general anesthesia.  Perioperative antibiotics were administered.  The patient was placed in dorsal lithotomy and  Patient was prepped and draped in a standard sterile fashion and a timeout was performed.  a 21 French rigid cystoscope was advanced into urethra and into the bladder.  Complete cystoscopy was performed with no abnormal findings.  Stent was grasped pulled just beyond the urethral meatus.  A wire was advanced through the stent up to the kidney under fluoroscopic guidance.  Stent was withdrawn.  Wire was secured to the drape as a safety wire.  Semirigid  ureteroscopy was performed alongside the wire and a stone was encountered which was basket distracted without any trauma.  Readvanced the scope up the ureter to the renal pelvis and no other calculi were seen.  A second wire was advanced through the scope and into the kidney and the scope was withdrawn.  12 x 14 ureteral access sheath was advanced over the wire under continuous fluoroscopic guidance up to the proximal ureter.  Inner sheath and wire were withdrawn.  Digital ureteroscopy was performed and complete pyeloscopy showed some tiny calculi too small to basket extract.  There was a 5 mm radiopacity in the vicinity of the left lower pole but I could not find a stone to correspond to this radiopacity.  I started retrograde pyelogram through the scope and it did look like some contrast extended to the area of the radiopacity and I again looked but could not access it despite maximum flexion with the scope.  I withdrew the scope along with the access sheath visualizing the ureter upon removal.  There were no ureteral calculi seen and no ureteral injury identified.  I backloaded the wire onto rigid cystoscope and advanced that into the bladder followed by routine placement of a 6 x 24 double-J ureteral stent.  Fluoroscopy confirmed proximal placement and direct visualization confirmed a good coil within the bladder.  I drained the bladder withdrew the scope.  Patient tolerated the procedure well was stable postoperatively.  Plan: Follow-up in 1 week for stent removal.

## 2024-08-14 ENCOUNTER — Encounter (HOSPITAL_COMMUNITY): Payer: Self-pay | Admitting: Urology

## 2024-08-15 ENCOUNTER — Telehealth: Payer: Self-pay

## 2024-08-15 ENCOUNTER — Encounter: Payer: Self-pay | Admitting: Internal Medicine

## 2024-08-15 ENCOUNTER — Other Ambulatory Visit: Payer: Self-pay

## 2024-08-15 ENCOUNTER — Ambulatory Visit: Admitting: Internal Medicine

## 2024-08-15 VITALS — BP 156/77 | HR 44 | Temp 96.6°F | Ht 64.5 in | Wt 143.0 lb

## 2024-08-15 DIAGNOSIS — N139 Obstructive and reflux uropathy, unspecified: Secondary | ICD-10-CM

## 2024-08-15 DIAGNOSIS — Z881 Allergy status to other antibiotic agents status: Secondary | ICD-10-CM

## 2024-08-15 DIAGNOSIS — B9620 Unspecified Escherichia coli [E. coli] as the cause of diseases classified elsewhere: Secondary | ICD-10-CM

## 2024-08-15 DIAGNOSIS — N390 Urinary tract infection, site not specified: Secondary | ICD-10-CM | POA: Diagnosis not present

## 2024-08-15 DIAGNOSIS — Z882 Allergy status to sulfonamides status: Secondary | ICD-10-CM

## 2024-08-15 DIAGNOSIS — A419 Sepsis, unspecified organism: Secondary | ICD-10-CM

## 2024-08-15 DIAGNOSIS — R7881 Bacteremia: Secondary | ICD-10-CM | POA: Diagnosis not present

## 2024-08-15 NOTE — Progress Notes (Signed)
 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   Arthritis of carpometacarpal (CMC) joint of both thumbs 05/02/2024   Abnormal CT of the abdomen 03/20/2024   Dehydration 03/20/2024   Hypokalemia 03/20/2024   Nausea and vomiting 03/20/2024   Intractable nausea and vomiting 03/19/2024   Generalized postprandial abdominal pain 03/13/2024   Loss of weight 03/13/2024   History of colitis 03/13/2024   Hemorrhagic colitis 02/26/2024   Rectal bleeding 02/26/2024   Primary biliary cirrhosis (HCC) 11/26/2023   Right lower quadrant abdominal pain 11/26/2023   Right upper quadrant abdominal pain 11/26/2023   Antimitochondrial antibody positive 10/23/2023   Chronic idiopathic constipation 10/23/2023   Gastritis and gastroduodenitis 07/19/2023   Gastric polyp 07/19/2023   Dyspepsia 07/10/2023   Elevated liver enzymes 07/10/2023   Overflow diarrhea 07/10/2023   Metabolic dysfunction-associated steatotic liver disease (MASLD) 07/10/2023   Precordial pain    Insomnia    COPD (chronic obstructive pulmonary disease) (HCC) 05/14/2020   Ventricular septal defect 03/28/2016   Postoperative anemia due to acute blood loss 02/08/2016   Primary localized osteoarthritis of left knee    Primary localized osteoarthritis of right knee 11/03/2015   DJD (degenerative joint disease) of knee 11/03/2015   Rheumatoid arthritis (HCC)    PONV (postoperative nausea and vomiting)    Seizures (HCC)    Fibromyalgia    History of staph infection    Complex partial epilepsy (HCC) 08/10/2014   S/P tonsillectomy and adenoidectomy 04/01/2014   Panic anxiety  syndrome 06/03/2013   Positive depression screening 06/03/2013   Headache 06/03/2013   Cerebral artery occlusion with cerebral infarction (HCC) 06/03/2013   Patent foramen ovale 05/29/2013   Dysphagia 04/17/2013   Persistent headaches 03/26/2013   Cerebral infarction (HCC) 03/26/2013   Leukocytosis 03/26/2013   Arthritis 03/26/2013   QT prolongation 03/26/2013   History of Stroke 03/26/2013   Recurrent ventral incisional hernia 04/19/2012   Obstructive sleep apnea 08/22/2010   RESTLESS LEGS SYNDROME 08/22/2010   Allergic asthma, moderate persistent, uncomplicated 08/22/2010   ECZEMA 08/22/2010   Irritable bowel syndrome 06/23/2010   DIARRHEA 06/23/2010   KNEE, ARTHRITIS, DEGEN./OSTEO 11/18/2008     Subjective:  [name] is a 63 y.o. female with past medical history of COPD, diverticulitis, GERD, renal stones, anxiety/depression, insomnia, IBS, PFO, rheumatoid arthritis on Orencia presents for management of E. coli bacteremia with complicated UTI secondary to obstructive uropathy.  Patient was admitted with flank pain found to have E. coli bacteremia started on ceftriaxone.  CT renal study showed calculus in the left proximal ureter near UPJ, nonobstructive 5 mm calculus in inferior pole of left kidney.  She underwent cystoscopy on 10/7 with stent placement.  Discharged on ceftriaxone x 2 weeks.  She had midline issue and was replaced on 10/17.   Review of Systems  All other systems reviewed and are negative.   Past Medical History:  Diagnosis Date   Allergic rhinitis    uses Flonase  daily as needed and takes CLaritin  daily   Anxiety    takes Xanax  daily as needed   Asthma    Albuterol  inhaler prn;SYmbicort  daily   Cerebral vascular  malformation    sees dr mavis for monitoring as needed, sees dr lewitt for headaches every 4 months   Congenital sucrase-isomaltase deficiency    COPD (chronic obstructive pulmonary disease) (HCC)    Depression    takes Citalopram  daily    Diverticulitis at age 58   Eczema    GERD (gastroesophageal reflux disease)    takes Protonix  daily   Hard of hearing    History of kidney stones    History of migraine    last one 10+yrs ago   History of staph infection 16yrs ago   Hyperlipidemia    takes Pravastatin  daily   IBS (irritable bowel syndrome)    mixed   Insomnia    Joint pain    Lactose intolerance    Nausea    takes Zofran  daily as needed   Osteoarthritis    PFO (patent foramen ovale)    PONV (postoperative nausea and vomiting)    Postoperative anemia due to acute blood loss 02/08/2016   Primary biliary cholangitis (HCC)    Primary localized osteoarthritis of left knee    Primary localized osteoarthritis of right knee 11/03/2015   Psoriatic arthritis (HCC)    Rheumatoid arthritis(714.0) 10/16/2008   oa and ra;Rhemicade IV every 6wks and Metotrexate weekly   Seizures (HCC) 6months ago 03/21/14   takes Depakote  daily   Shortness of breath    with exertion   Sleep apnea    study done >90yrs ago; trying to get a new machine   Spinal headache    patient states that she thinks she had a spinal headache a long time ago   Stress incontinence    Stroke (HCC) 03/26/2013   left sided weakness   Thyroid  cyst     Outpatient Medications Prior to Visit  Medication Sig Dispense Refill   albuterol  (VENTOLIN  HFA) 108 (90 Base) MCG/ACT inhaler Inhale 1-2 puffs into the lungs every 6 (six) hours as needed for wheezing or shortness of breath.     ALPRAZolam  (XANAX ) 0.5 MG tablet Take 0.5 mg by mouth at bedtime.     amLODipine  (NORVASC ) 5 MG tablet Take 1 tablet (5 mg total) by mouth daily. 90 tablet 3   aspirin  EC 81 MG tablet Take 81 mg by mouth at bedtime.     dicyclomine  (BENTYL ) 10 MG capsule Take 1 capsule (10 mg total) by mouth 4 (four) times daily -  before meals and at bedtime. (Patient taking differently: Take 10 mg by mouth 4 (four) times daily.) 120 capsule 0   hyoscyamine  (LEVSIN  SL) 0.125 MG SL tablet Place 1  tablet (0.125 mg total) under the tongue every 6 (six) hours as needed. 60 tablet 1   ipratropium (ATROVENT ) 0.03 % nasal spray Place 2 sprays into the nose in the morning and at bedtime.     loratadine  (CLARITIN ) 10 MG tablet Take 10 mg by mouth at bedtime.     lubiprostone  (AMITIZA ) 24 MCG capsule Take 1 capsule (24 mcg total) by mouth 2 (two) times daily with a meal. 60 capsule 5   nitroGLYCERIN  (NITROSTAT ) 0.4 MG SL tablet Place 1 tablet (0.4 mg total) under the tongue every 5 (five) minutes as needed for chest pain. 25 tablet 3   OVER THE COUNTER MEDICATION Bio True eye drops daily.     oxyCODONE  (OXY IR/ROXICODONE ) 5 MG immediate release tablet Take 1 tablet (5 mg total) by mouth every 4 (four) hours as needed for severe pain (pain score 7-10). 10 tablet 0  pantoprazole  (PROTONIX ) 40 MG tablet TAKE 1 TABLET BY MOUTH DAILY 100 tablet 2   pravastatin  (PRAVACHOL ) 40 MG tablet Take 40 mg by mouth daily.     PREBIOTIC PRODUCT PO Take by mouth at bedtime.     PRESCRIPTION MEDICATION In Cholesterol study. Has injection once a month.has been in study for 3 years as of 12/07/23 and has 6 months to left in study. ( Will finish around end of August 2025 )     rizatriptan (MAXALT) 5 MG tablet Take 5 mg by mouth as needed for migraine. (not more than 3 a week). May repeat in 2 hours if needed     Sacrosidase  (SUCRAID ) 8500 UNIT/ML SOLN Take 2 mLs (17,000 Units total) by mouth with breakfast, with lunch, and with evening meal. Administer half the dose at the beginning of th meal or snack and other half during the meals 180 mL 2   tiZANidine (ZANAFLEX) 4 MG tablet Take 1 tablet by mouth every 6 (six) hours as needed for muscle spasms.     topiramate  (TOPAMAX ) 100 MG tablet Take 1 tablet (100 mg total) by mouth at bedtime. 30 tablet 0   ursodiol  (ACTIGALL ) 300 MG capsule Take 1 capsule (300 mg total) by mouth 3 (three) times daily. 90 capsule 5   No facility-administered medications prior to visit.      Allergies  Allergen Reactions   Aquacel [Carboxymethylcellulose] Other (See Comments)    Blisters    Dextromethorphan Anaphylaxis   Nickel Rash    Turn read   Sulfonamide Derivatives Anaphylaxis, Hives and Swelling    Swelling of tongue   Aspirin  Nausea And Vomiting    Severe GI upset.  Pt states she can tolerate 81 mg asa only.    Ciprofloxacin Itching and Swelling    Angioedema, urticaria   Cosyntropin Hives and Swelling   Amoxicillin  Nausea And Vomiting    GI upset   Diclofenac Sodium Dermatitis    Gel causes legs to break out   Latex Rash    Blisters   Moxifloxacin Other (See Comments)    GI upset   Nsaids Other (See Comments)    Gi upset   Tape Other (See Comments) and Dermatitis    Blisters skin    Social History   Tobacco Use   Smoking status: Former    Current packs/day: 0.00    Average packs/day: 2.0 packs/day for 30.0 years (60.0 ttl pk-yrs)    Types: Cigarettes    Start date: 10/19/1969    Quit date: 10/20/1999    Years since quitting: 24.8   Smokeless tobacco: Never  Vaping Use   Vaping status: Never Used  Substance Use Topics   Alcohol use: Not Currently    Comment: occ   Drug use: No    Family History  Problem Relation Age of Onset   Ovarian cancer Mother    Diabetes Maternal Grandmother    Arthritis Maternal Grandmother    Diabetes Brother    Diabetes Paternal Grandmother    Diabetes Maternal Grandfather    Diabetes Paternal Grandfather    Heart disease Brother    Heart disease Other        Uncle   Breast cancer Maternal Aunt     Objective:   Vitals:   08/15/24 1120  Weight: 143 lb (64.9 kg)  Height: 5' 4.5 (1.638 m)   Body mass index is 24.17 kg/m.  Physical Exam Constitutional:      Appearance: Normal appearance.  HENT:  Head: Normocephalic and atraumatic.     Right Ear: Tympanic membrane normal.     Left Ear: Tympanic membrane normal.     Nose: Nose normal.     Mouth/Throat:     Mouth: Mucous membranes are moist.   Eyes:     Extraocular Movements: Extraocular movements intact.     Conjunctiva/sclera: Conjunctivae normal.     Pupils: Pupils are equal, round, and reactive to light.  Cardiovascular:     Rate and Rhythm: Normal rate and regular rhythm.     Heart sounds: No murmur heard.    No friction rub. No gallop.  Pulmonary:     Effort: Pulmonary effort is normal.     Breath sounds: Normal breath sounds.  Abdominal:     General: Abdomen is flat.     Palpations: Abdomen is soft.  Musculoskeletal:        General: Normal range of motion.  Skin:    General: Skin is warm and dry.  Neurological:     General: No focal deficit present.     Mental Status: She is alert and oriented to person, place, and time.  Psychiatric:        Mood and Affect: Mood normal.     Lab Results: Lab Results  Component Value Date   WBC 12.3 (H) 07/26/2024   HGB 12.8 07/26/2024   HCT 39.9 07/26/2024   MCV 97.8 07/26/2024   PLT 138 (L) 07/26/2024    Lab Results  Component Value Date   CREATININE 0.49 08/06/2024   BUN 8 08/06/2024   NA 142 08/06/2024   K 4.2 08/06/2024   CL 111 08/06/2024   CO2 22 08/06/2024    Lab Results  Component Value Date   ALT 12 08/06/2024   AST 21 08/06/2024   GGT 28 10/23/2023   ALKPHOS 109 08/06/2024   BILITOT 0.3 08/06/2024     Assessment & Plan:  # E coli bacteremia  # Complicated UTI  # Obstructive Uropathy  S/p cystoscopy with left retrograde pyelogram and left ureteral stent placement  # Allergy to ciprofloxacin and sulfa -OPAT ordered with cx 2 weeks from 10/7 eot 10/20 -She noted lower abdominal stone pain and  Hematuria.  -New midline 08/01/24.-Extended abx till 10/30.  OR on 10/29 with urology   with c cystoscopy, stone extraction, left ureteral exchange. Today: pain is significantly improved. New stent in on 10/29.  Okay to stop antibiotics.    #PICC #Medication management - 07/29/2024 labs CRP 21.9, esr 41, wbc 10.4 -10/22 cmp stable.  -remove  picc -f/u wit ID  prn Loney Stank, MD [facility]   08/15/24  11:21 AM   I have personally spent 66 minutes involved in face-to-face and non-face-to-face activities for this patient on the day of the visit. Professional time spent includes the following activities: Preparing to see the patient (review of tests), Obtaining and/or reviewing separately obtained history (admission/discharge record), Performing a medically appropriate examination and/or evaluation , Ordering medications/tests/procedures, referring and communicating with other health care professionals, Documenting clinical information in the EMR, Independently interpreting results (not separately reported), Communicating results to the patient/family/caregiver, Counseling and educating the patient/family/caregiver and Care coordination (not separately reported).

## 2024-08-15 NOTE — Telephone Encounter (Signed)
 Sent a message to [facility] advising them patient can have picc line removed per Dr. Dennise and she has completed IV abx.  Candies Palm ONEIDA Ligas, CMA

## 2024-08-15 NOTE — Anesthesia Postprocedure Evaluation (Signed)
 Anesthesia Post Note  Patient: Asna A Plant  Procedure(s) Performed: CYSTOSCOPY/URETEROSCOPY/HOLMIUM LASER/STENT PLACEMENT (Left: Ureter) CYSTOSCOPY, WITH RETROGRADE PYELOGRAM (Left: Ureter)     Patient location during evaluation: PACU Anesthesia Type: General Level of consciousness: awake and alert Pain management: pain level controlled Vital Signs Assessment: post-procedure vital signs reviewed and stable Respiratory status: spontaneous breathing, nonlabored ventilation, respiratory function stable and patient connected to nasal cannula oxygen Cardiovascular status: blood pressure returned to baseline and stable Postop Assessment: no apparent nausea or vomiting Anesthetic complications: no   No notable events documented.  Last Vitals:  Vitals:   08/13/24 1400 08/13/24 1419  BP: 129/73 (!) 143/73  Pulse: 61 (!) 48  Resp: 14   Temp: (!) 36.4 C (!) 36.3 C  SpO2: 97% 100%    Last Pain:  Vitals:   08/13/24 1419  TempSrc: Oral  PainSc: 4                  Epifanio Lamar BRAVO

## 2024-08-15 NOTE — Patient Instructions (Signed)
 Remove picc F/u prn

## 2024-08-21 ENCOUNTER — Other Ambulatory Visit: Payer: Self-pay | Admitting: Surgery

## 2024-08-25 LAB — SURGICAL PATHOLOGY

## 2024-09-29 ENCOUNTER — Other Ambulatory Visit (INDEPENDENT_AMBULATORY_CARE_PROVIDER_SITE_OTHER): Payer: Self-pay | Admitting: Gastroenterology

## 2024-10-25 ENCOUNTER — Encounter (INDEPENDENT_AMBULATORY_CARE_PROVIDER_SITE_OTHER): Payer: Self-pay | Admitting: Gastroenterology

## 2024-10-30 ENCOUNTER — Telehealth (INDEPENDENT_AMBULATORY_CARE_PROVIDER_SITE_OTHER): Payer: Self-pay | Admitting: Gastroenterology

## 2024-10-30 DIAGNOSIS — E7431 Sucrase-isomaltase deficiency: Secondary | ICD-10-CM

## 2024-10-30 MED ORDER — SUCRAID 8500 UNIT/ML PO SOLN: 2.0000 mL | Freq: Three times a day (TID) | ORAL | 2 refills | Status: AC

## 2024-10-30 NOTE — Telephone Encounter (Signed)
 Fax from [facility] stating that Sucraid  SD 8500 unit/ml sol was processed at [facility] and will be shipped to your patient for delivery on 11/05/24. Please be advised this is the last refill your patient has on file. Please send new prescription for us  to dispense the next refill  Refill sent to [facility].
